# Patient Record
Sex: Female | Born: 1950
Health system: Southern US, Community
[De-identification: ages and names within clinical notes are randomized; demographics above are authoritative.]

## PROBLEM LIST (undated history)

## (undated) DIAGNOSIS — C8590 Non-Hodgkin lymphoma, unspecified, unspecified site: Secondary | ICD-10-CM

## (undated) DIAGNOSIS — D649 Anemia, unspecified: Secondary | ICD-10-CM

## (undated) DIAGNOSIS — Z8739 Personal history of other diseases of the musculoskeletal system and connective tissue: Secondary | ICD-10-CM

## (undated) DIAGNOSIS — Z85528 Personal history of other malignant neoplasm of kidney: Secondary | ICD-10-CM

## (undated) DIAGNOSIS — C4490 Unspecified malignant neoplasm of skin, unspecified: Secondary | ICD-10-CM

## (undated) DIAGNOSIS — C801 Malignant (primary) neoplasm, unspecified: Secondary | ICD-10-CM

## (undated) DIAGNOSIS — Z87442 Personal history of urinary calculi: Secondary | ICD-10-CM

## (undated) DIAGNOSIS — Z85038 Personal history of other malignant neoplasm of large intestine: Secondary | ICD-10-CM

## (undated) DIAGNOSIS — Z1509 Genetic susceptibility to other malignant neoplasm: Principal | ICD-10-CM

## (undated) DIAGNOSIS — T7840XA Allergy, unspecified, initial encounter: Secondary | ICD-10-CM

## (undated) DIAGNOSIS — E538 Deficiency of other specified B group vitamins: Secondary | ICD-10-CM

## (undated) DIAGNOSIS — M25511 Pain in right shoulder: Secondary | ICD-10-CM

## (undated) DIAGNOSIS — C189 Malignant neoplasm of colon, unspecified: Secondary | ICD-10-CM

## (undated) DIAGNOSIS — M1990 Unspecified osteoarthritis, unspecified site: Secondary | ICD-10-CM

## (undated) DIAGNOSIS — K529 Noninfective gastroenteritis and colitis, unspecified: Secondary | ICD-10-CM

## (undated) DIAGNOSIS — E785 Hyperlipidemia, unspecified: Secondary | ICD-10-CM

## (undated) DIAGNOSIS — Z87440 Personal history of urinary (tract) infections: Secondary | ICD-10-CM

## (undated) DIAGNOSIS — G709 Myoneural disorder, unspecified: Secondary | ICD-10-CM

## (undated) DIAGNOSIS — Z8614 Personal history of Methicillin resistant Staphylococcus aureus infection: Secondary | ICD-10-CM

## (undated) HISTORY — DX: Pain in right shoulder: M25.511

## (undated) HISTORY — DX: Deficiency of other specified B group vitamins: E53.8

## (undated) HISTORY — PX: EYE SURGERY: SHX253

## (undated) HISTORY — DX: Myoneural disorder, unspecified: G70.9

## (undated) HISTORY — DX: Hyperlipidemia, unspecified: E78.5

## (undated) HISTORY — DX: Malignant neoplasm of colon, unspecified: C18.9

## (undated) HISTORY — DX: Malignant (primary) neoplasm, unspecified: C80.1

## (undated) HISTORY — PX: COLON SURGERY: SHX602

## (undated) HISTORY — PX: COLONOSCOPY W/ POLYPECTOMY: SHX1380

## (undated) HISTORY — DX: Allergy, unspecified, initial encounter: T78.40XA

## (undated) HISTORY — DX: Anemia, unspecified: D64.9

## (undated) HISTORY — PX: OTHER SURGICAL HISTORY: SHX169

## (undated) HISTORY — DX: Personal history of Methicillin resistant Staphylococcus aureus infection: Z86.14

## (undated) HISTORY — DX: Genetic susceptibility to other malignant neoplasm: Z15.09

## (undated) HISTORY — PX: ABDOMINAL HYSTERECTOMY: SHX81

## (undated) HISTORY — DX: Non-Hodgkin lymphoma, unspecified, unspecified site: C85.90

## (undated) HISTORY — DX: Personal history of urinary (tract) infections: Z87.440

## (undated) HISTORY — PX: LITHOTRIPSY: SUR834

---

## 1991-03-19 HISTORY — PX: LAPAROSCOPIC ASSISTED VAGINAL HYSTERECTOMY: SHX5398

## 1991-03-19 HISTORY — PX: APPENDECTOMY: SHX54

## 1999-03-19 HISTORY — PX: HEMICOLECTOMY: SHX854

## 2000-03-18 HISTORY — PX: HEMICOLECTOMY: SHX854

## 2000-07-31 ENCOUNTER — Encounter: Payer: Self-pay | Admitting: Specialist

## 2000-07-31 ENCOUNTER — Ambulatory Visit (HOSPITAL_COMMUNITY): Admission: RE | Admit: 2000-07-31 | Discharge: 2000-07-31 | Payer: Self-pay | Admitting: Specialist

## 2000-08-08 ENCOUNTER — Ambulatory Visit (HOSPITAL_COMMUNITY): Admission: RE | Admit: 2000-08-08 | Discharge: 2000-08-08 | Payer: Self-pay | Admitting: Family Medicine

## 2000-08-08 ENCOUNTER — Encounter: Payer: Self-pay | Admitting: Family Medicine

## 2000-08-19 ENCOUNTER — Ambulatory Visit (HOSPITAL_COMMUNITY): Admission: RE | Admit: 2000-08-19 | Discharge: 2000-08-19 | Payer: Self-pay | Admitting: General Surgery

## 2000-08-19 ENCOUNTER — Encounter: Payer: Self-pay | Admitting: General Surgery

## 2000-08-22 ENCOUNTER — Inpatient Hospital Stay (HOSPITAL_COMMUNITY): Admission: RE | Admit: 2000-08-22 | Discharge: 2000-08-27 | Payer: Self-pay | Admitting: Internal Medicine

## 2000-10-01 ENCOUNTER — Encounter: Admission: RE | Admit: 2000-10-01 | Discharge: 2000-10-01 | Payer: Self-pay | Admitting: Oncology

## 2001-03-13 ENCOUNTER — Ambulatory Visit (HOSPITAL_COMMUNITY): Admission: RE | Admit: 2001-03-13 | Discharge: 2001-03-13 | Payer: Self-pay | Admitting: Internal Medicine

## 2001-07-31 ENCOUNTER — Ambulatory Visit (HOSPITAL_COMMUNITY): Admission: RE | Admit: 2001-07-31 | Discharge: 2001-07-31 | Payer: Self-pay | Admitting: *Deleted

## 2001-07-31 ENCOUNTER — Encounter: Payer: Self-pay | Admitting: Specialist

## 2001-09-17 ENCOUNTER — Ambulatory Visit (HOSPITAL_COMMUNITY): Admission: RE | Admit: 2001-09-17 | Discharge: 2001-09-17 | Payer: Self-pay | Admitting: Internal Medicine

## 2002-01-08 ENCOUNTER — Ambulatory Visit (HOSPITAL_COMMUNITY): Admission: RE | Admit: 2002-01-08 | Discharge: 2002-01-08 | Payer: Self-pay | Admitting: Internal Medicine

## 2002-01-08 ENCOUNTER — Encounter (INDEPENDENT_AMBULATORY_CARE_PROVIDER_SITE_OTHER): Payer: Self-pay | Admitting: Internal Medicine

## 2002-05-30 ENCOUNTER — Emergency Department (HOSPITAL_COMMUNITY): Admission: EM | Admit: 2002-05-30 | Discharge: 2002-05-30 | Payer: Self-pay | Admitting: Emergency Medicine

## 2002-06-01 ENCOUNTER — Ambulatory Visit (HOSPITAL_COMMUNITY): Admission: RE | Admit: 2002-06-01 | Discharge: 2002-06-01 | Payer: Self-pay | Admitting: Internal Medicine

## 2002-08-04 ENCOUNTER — Encounter: Payer: Self-pay | Admitting: Family Medicine

## 2002-08-04 ENCOUNTER — Ambulatory Visit (HOSPITAL_COMMUNITY): Admission: RE | Admit: 2002-08-04 | Discharge: 2002-08-04 | Payer: Self-pay | Admitting: Family Medicine

## 2003-08-09 ENCOUNTER — Ambulatory Visit (HOSPITAL_COMMUNITY): Admission: RE | Admit: 2003-08-09 | Discharge: 2003-08-09 | Payer: Self-pay | Admitting: Specialist

## 2003-09-27 ENCOUNTER — Ambulatory Visit (HOSPITAL_COMMUNITY): Admission: RE | Admit: 2003-09-27 | Discharge: 2003-09-27 | Payer: Self-pay | Admitting: Internal Medicine

## 2003-12-14 ENCOUNTER — Ambulatory Visit (HOSPITAL_COMMUNITY): Admission: RE | Admit: 2003-12-14 | Discharge: 2003-12-14 | Payer: Self-pay | Admitting: Family Medicine

## 2004-09-26 ENCOUNTER — Ambulatory Visit: Payer: Self-pay | Admitting: Internal Medicine

## 2004-10-04 ENCOUNTER — Ambulatory Visit: Payer: Self-pay | Admitting: Internal Medicine

## 2004-11-16 ENCOUNTER — Ambulatory Visit: Payer: Self-pay | Admitting: Internal Medicine

## 2004-11-16 ENCOUNTER — Ambulatory Visit (HOSPITAL_COMMUNITY): Admission: RE | Admit: 2004-11-16 | Discharge: 2004-11-16 | Payer: Self-pay | Admitting: Internal Medicine

## 2004-12-04 ENCOUNTER — Encounter: Admission: RE | Admit: 2004-12-04 | Discharge: 2004-12-04 | Payer: Self-pay | Admitting: Internal Medicine

## 2004-12-18 ENCOUNTER — Ambulatory Visit (HOSPITAL_COMMUNITY): Admission: RE | Admit: 2004-12-18 | Discharge: 2004-12-18 | Payer: Self-pay | Admitting: Internal Medicine

## 2005-03-18 HISTORY — PX: SUBTOTAL COLECTOMY: SHX855

## 2005-03-18 HISTORY — PX: OTHER SURGICAL HISTORY: SHX169

## 2005-03-20 ENCOUNTER — Ambulatory Visit (HOSPITAL_COMMUNITY): Admission: RE | Admit: 2005-03-20 | Discharge: 2005-03-20 | Payer: Self-pay | Admitting: Internal Medicine

## 2005-04-17 ENCOUNTER — Encounter (INDEPENDENT_AMBULATORY_CARE_PROVIDER_SITE_OTHER): Payer: Self-pay | Admitting: Specialist

## 2005-04-17 ENCOUNTER — Inpatient Hospital Stay (HOSPITAL_COMMUNITY): Admission: RE | Admit: 2005-04-17 | Discharge: 2005-04-20 | Payer: Self-pay | Admitting: Urology

## 2005-08-30 ENCOUNTER — Ambulatory Visit (HOSPITAL_COMMUNITY): Admission: RE | Admit: 2005-08-30 | Discharge: 2005-08-30 | Payer: Self-pay | Admitting: Internal Medicine

## 2005-08-30 ENCOUNTER — Encounter (INDEPENDENT_AMBULATORY_CARE_PROVIDER_SITE_OTHER): Payer: Self-pay | Admitting: Specialist

## 2005-08-30 ENCOUNTER — Ambulatory Visit: Payer: Self-pay | Admitting: Internal Medicine

## 2005-09-20 ENCOUNTER — Ambulatory Visit (HOSPITAL_COMMUNITY): Admission: RE | Admit: 2005-09-20 | Discharge: 2005-09-20 | Payer: Self-pay | Admitting: Family Medicine

## 2005-10-02 ENCOUNTER — Ambulatory Visit: Payer: Self-pay | Admitting: Internal Medicine

## 2006-01-20 ENCOUNTER — Ambulatory Visit (HOSPITAL_COMMUNITY): Payer: Self-pay | Admitting: Oncology

## 2006-01-20 ENCOUNTER — Encounter: Admission: RE | Admit: 2006-01-20 | Discharge: 2006-01-20 | Payer: Self-pay | Admitting: Oncology

## 2006-03-03 ENCOUNTER — Emergency Department (HOSPITAL_COMMUNITY): Admission: EM | Admit: 2006-03-03 | Discharge: 2006-03-03 | Payer: Self-pay | Admitting: Emergency Medicine

## 2006-03-14 ENCOUNTER — Encounter (HOSPITAL_COMMUNITY): Admission: RE | Admit: 2006-03-14 | Discharge: 2006-03-17 | Payer: Self-pay | Admitting: Oncology

## 2006-03-17 ENCOUNTER — Ambulatory Visit (HOSPITAL_COMMUNITY): Admission: RE | Admit: 2006-03-17 | Discharge: 2006-03-17 | Payer: Self-pay | Admitting: Oncology

## 2006-03-24 ENCOUNTER — Ambulatory Visit (HOSPITAL_COMMUNITY): Payer: Self-pay | Admitting: Oncology

## 2006-03-27 ENCOUNTER — Ambulatory Visit: Payer: Self-pay | Admitting: Orthopedic Surgery

## 2006-06-20 ENCOUNTER — Ambulatory Visit (HOSPITAL_COMMUNITY): Payer: Self-pay | Admitting: Oncology

## 2006-06-20 ENCOUNTER — Encounter (HOSPITAL_COMMUNITY): Admission: RE | Admit: 2006-06-20 | Discharge: 2006-07-20 | Payer: Self-pay | Admitting: Oncology

## 2006-09-26 ENCOUNTER — Encounter (HOSPITAL_COMMUNITY): Admission: RE | Admit: 2006-09-26 | Discharge: 2006-10-26 | Payer: Self-pay | Admitting: Oncology

## 2006-09-26 ENCOUNTER — Ambulatory Visit (HOSPITAL_COMMUNITY): Payer: Self-pay | Admitting: Oncology

## 2006-10-03 ENCOUNTER — Ambulatory Visit (HOSPITAL_COMMUNITY): Admission: RE | Admit: 2006-10-03 | Discharge: 2006-10-03 | Payer: Self-pay | Admitting: Obstetrics and Gynecology

## 2006-12-26 ENCOUNTER — Encounter (HOSPITAL_COMMUNITY): Admission: RE | Admit: 2006-12-26 | Discharge: 2007-01-25 | Payer: Self-pay | Admitting: Oncology

## 2006-12-26 ENCOUNTER — Ambulatory Visit (HOSPITAL_COMMUNITY): Payer: Self-pay | Admitting: Oncology

## 2007-03-10 ENCOUNTER — Ambulatory Visit (HOSPITAL_COMMUNITY): Admission: RE | Admit: 2007-03-10 | Discharge: 2007-03-10 | Payer: Self-pay | Admitting: Internal Medicine

## 2007-03-23 ENCOUNTER — Encounter (HOSPITAL_COMMUNITY): Admission: RE | Admit: 2007-03-23 | Discharge: 2007-04-22 | Payer: Self-pay | Admitting: Oncology

## 2007-03-23 ENCOUNTER — Ambulatory Visit (HOSPITAL_COMMUNITY): Payer: Self-pay | Admitting: Oncology

## 2007-05-04 ENCOUNTER — Encounter (HOSPITAL_COMMUNITY): Admission: RE | Admit: 2007-05-04 | Discharge: 2007-06-03 | Payer: Self-pay | Admitting: Oncology

## 2007-06-22 ENCOUNTER — Encounter (HOSPITAL_COMMUNITY): Admission: RE | Admit: 2007-06-22 | Discharge: 2007-07-22 | Payer: Self-pay | Admitting: Oncology

## 2007-06-22 ENCOUNTER — Ambulatory Visit (HOSPITAL_COMMUNITY): Payer: Self-pay | Admitting: Oncology

## 2007-07-24 ENCOUNTER — Encounter (HOSPITAL_COMMUNITY): Admission: RE | Admit: 2007-07-24 | Discharge: 2007-08-23 | Payer: Self-pay | Admitting: Oncology

## 2007-08-14 ENCOUNTER — Ambulatory Visit (HOSPITAL_COMMUNITY): Payer: Self-pay | Admitting: Oncology

## 2007-09-16 ENCOUNTER — Ambulatory Visit (HOSPITAL_COMMUNITY): Admission: RE | Admit: 2007-09-16 | Discharge: 2007-09-16 | Payer: Self-pay | Admitting: Internal Medicine

## 2007-09-16 ENCOUNTER — Encounter (INDEPENDENT_AMBULATORY_CARE_PROVIDER_SITE_OTHER): Payer: Self-pay | Admitting: Internal Medicine

## 2007-10-19 ENCOUNTER — Ambulatory Visit (HOSPITAL_COMMUNITY): Payer: Self-pay | Admitting: Oncology

## 2007-10-23 ENCOUNTER — Ambulatory Visit (HOSPITAL_COMMUNITY): Admission: RE | Admit: 2007-10-23 | Discharge: 2007-10-23 | Payer: Self-pay | Admitting: Obstetrics and Gynecology

## 2007-12-15 ENCOUNTER — Ambulatory Visit (HOSPITAL_COMMUNITY): Payer: Self-pay | Admitting: Oncology

## 2008-01-01 ENCOUNTER — Encounter (HOSPITAL_COMMUNITY): Admission: RE | Admit: 2008-01-01 | Discharge: 2008-01-31 | Payer: Self-pay | Admitting: Oncology

## 2008-02-10 ENCOUNTER — Ambulatory Visit (HOSPITAL_COMMUNITY): Payer: Self-pay | Admitting: Oncology

## 2008-04-07 ENCOUNTER — Encounter (INDEPENDENT_AMBULATORY_CARE_PROVIDER_SITE_OTHER): Payer: Self-pay | Admitting: Internal Medicine

## 2008-04-07 ENCOUNTER — Ambulatory Visit (HOSPITAL_COMMUNITY): Admission: RE | Admit: 2008-04-07 | Discharge: 2008-04-07 | Payer: Self-pay | Admitting: Internal Medicine

## 2008-04-12 ENCOUNTER — Ambulatory Visit (HOSPITAL_COMMUNITY): Payer: Self-pay | Admitting: Oncology

## 2008-06-10 ENCOUNTER — Ambulatory Visit (HOSPITAL_COMMUNITY): Payer: Self-pay | Admitting: Oncology

## 2008-08-04 ENCOUNTER — Ambulatory Visit (HOSPITAL_COMMUNITY): Payer: Self-pay | Admitting: Oncology

## 2008-09-29 ENCOUNTER — Ambulatory Visit (HOSPITAL_COMMUNITY): Payer: Self-pay | Admitting: Oncology

## 2008-10-12 ENCOUNTER — Encounter (HOSPITAL_COMMUNITY): Admission: RE | Admit: 2008-10-12 | Discharge: 2008-11-11 | Payer: Self-pay | Admitting: Oncology

## 2008-12-01 ENCOUNTER — Ambulatory Visit (HOSPITAL_COMMUNITY): Payer: Self-pay | Admitting: Oncology

## 2009-01-23 ENCOUNTER — Ambulatory Visit (HOSPITAL_COMMUNITY): Admission: RE | Admit: 2009-01-23 | Discharge: 2009-01-23 | Payer: Self-pay | Admitting: Obstetrics & Gynecology

## 2009-01-25 ENCOUNTER — Ambulatory Visit (HOSPITAL_COMMUNITY): Payer: Self-pay | Admitting: Oncology

## 2009-03-22 ENCOUNTER — Ambulatory Visit (HOSPITAL_COMMUNITY): Payer: Self-pay | Admitting: Oncology

## 2009-06-01 ENCOUNTER — Ambulatory Visit (HOSPITAL_COMMUNITY): Payer: Self-pay | Admitting: Oncology

## 2009-06-28 ENCOUNTER — Ambulatory Visit (HOSPITAL_COMMUNITY): Admission: RE | Admit: 2009-06-28 | Discharge: 2009-06-28 | Payer: Self-pay | Admitting: Internal Medicine

## 2009-07-27 ENCOUNTER — Encounter (HOSPITAL_COMMUNITY): Admission: RE | Admit: 2009-07-27 | Discharge: 2009-08-26 | Payer: Self-pay | Admitting: Oncology

## 2009-07-27 ENCOUNTER — Ambulatory Visit (HOSPITAL_COMMUNITY): Payer: Self-pay | Admitting: Oncology

## 2009-09-25 ENCOUNTER — Ambulatory Visit (HOSPITAL_COMMUNITY): Payer: Self-pay | Admitting: Oncology

## 2009-10-17 ENCOUNTER — Ambulatory Visit (HOSPITAL_COMMUNITY): Admission: RE | Admit: 2009-10-17 | Discharge: 2009-10-17 | Payer: Self-pay | Admitting: Family Medicine

## 2009-11-15 ENCOUNTER — Ambulatory Visit (HOSPITAL_COMMUNITY): Admission: RE | Admit: 2009-11-15 | Discharge: 2009-11-15 | Payer: Self-pay | Admitting: Family Medicine

## 2009-11-17 ENCOUNTER — Ambulatory Visit (HOSPITAL_COMMUNITY): Payer: Self-pay | Admitting: Oncology

## 2010-01-23 ENCOUNTER — Encounter (HOSPITAL_COMMUNITY)
Admission: RE | Admit: 2010-01-23 | Discharge: 2010-02-22 | Payer: Self-pay | Source: Home / Self Care | Attending: Oncology | Admitting: Oncology

## 2010-01-23 ENCOUNTER — Ambulatory Visit (HOSPITAL_COMMUNITY): Payer: Self-pay | Admitting: Oncology

## 2010-01-25 ENCOUNTER — Ambulatory Visit (HOSPITAL_COMMUNITY): Admission: RE | Admit: 2010-01-25 | Discharge: 2010-01-25 | Payer: Self-pay | Admitting: Family Medicine

## 2010-03-23 ENCOUNTER — Encounter (HOSPITAL_COMMUNITY)
Admission: RE | Admit: 2010-03-23 | Discharge: 2010-04-17 | Payer: Self-pay | Source: Home / Self Care | Attending: Oncology | Admitting: Oncology

## 2010-03-23 ENCOUNTER — Ambulatory Visit (HOSPITAL_COMMUNITY)
Admission: RE | Admit: 2010-03-23 | Discharge: 2010-04-17 | Payer: Self-pay | Source: Home / Self Care | Attending: Oncology | Admitting: Oncology

## 2010-04-27 ENCOUNTER — Ambulatory Visit (HOSPITAL_COMMUNITY): Payer: 59

## 2010-04-27 DIAGNOSIS — E538 Deficiency of other specified B group vitamins: Secondary | ICD-10-CM

## 2010-04-27 DIAGNOSIS — C189 Malignant neoplasm of colon, unspecified: Secondary | ICD-10-CM

## 2010-05-25 ENCOUNTER — Ambulatory Visit (HOSPITAL_COMMUNITY): Payer: 59

## 2010-05-25 DIAGNOSIS — E538 Deficiency of other specified B group vitamins: Secondary | ICD-10-CM

## 2010-06-05 LAB — CBC
HCT: 36.3 % (ref 36.0–46.0)
Hemoglobin: 12.8 g/dL (ref 12.0–15.0)
MCV: 88.9 fL (ref 78.0–100.0)
Platelets: 187 10*3/uL (ref 150–400)
RBC: 4.09 MIL/uL (ref 3.87–5.11)
WBC: 7.6 10*3/uL (ref 4.0–10.5)

## 2010-06-05 LAB — COMPREHENSIVE METABOLIC PANEL
ALT: 19 U/L (ref 0–35)
CO2: 26 mEq/L (ref 19–32)
Chloride: 105 mEq/L (ref 96–112)
Creatinine, Ser: 0.68 mg/dL (ref 0.4–1.2)
GFR calc Af Amer: >60 mL/min (ref >60)
GFR calc non Af Amer: >60 mL/min (ref >60)
Total Protein: 6.7 g/dL (ref 6.0–8.3)

## 2010-06-20 ENCOUNTER — Encounter (HOSPITAL_COMMUNITY): Payer: 59 | Attending: Oncology

## 2010-06-20 DIAGNOSIS — C189 Malignant neoplasm of colon, unspecified: Secondary | ICD-10-CM

## 2010-06-20 DIAGNOSIS — E538 Deficiency of other specified B group vitamins: Secondary | ICD-10-CM

## 2010-06-24 LAB — COMPREHENSIVE METABOLIC PANEL
Alkaline Phosphatase: 63 U/L (ref 39–117)
CO2: 29 mEq/L (ref 19–32)
Calcium: 9 mg/dL (ref 8.4–10.5)
Glucose, Bld: 86 mg/dL (ref 70–99)
Total Protein: 6.7 g/dL (ref 6.0–8.3)

## 2010-06-24 LAB — CBC
Platelets: 179 10*3/uL (ref 150–400)
RDW: 13 % (ref 11.5–15.5)
WBC: 7.3 10*3/uL (ref 4.0–10.5)

## 2010-06-24 LAB — DIFFERENTIAL
Basophils Relative: 1 % (ref 0–1)
Eosinophils Relative: 3 % (ref 0–5)
Lymphocytes Relative: 24 % (ref 12–46)
Lymphs Abs: 1.7 10*3/uL (ref 0.7–4.0)
Neutro Abs: 4.7 10*3/uL (ref 1.7–7.7)
Neutrophils Relative %: 64 % (ref 43–77)

## 2010-07-18 ENCOUNTER — Encounter (HOSPITAL_COMMUNITY): Payer: 59 | Attending: Oncology

## 2010-07-18 DIAGNOSIS — E538 Deficiency of other specified B group vitamins: Secondary | ICD-10-CM

## 2010-07-26 ENCOUNTER — Encounter (HOSPITAL_BASED_OUTPATIENT_CLINIC_OR_DEPARTMENT_OTHER): Payer: 59 | Admitting: Internal Medicine

## 2010-07-26 ENCOUNTER — Ambulatory Visit (HOSPITAL_COMMUNITY)
Admission: RE | Admit: 2010-07-26 | Discharge: 2010-07-26 | Disposition: A | Discharge status: Home / Self Care | Payer: 59 | Source: Ambulatory Visit | Attending: Internal Medicine | Admitting: Internal Medicine

## 2010-07-26 DIAGNOSIS — Z1509 Genetic susceptibility to other malignant neoplasm: Secondary | ICD-10-CM

## 2010-07-26 DIAGNOSIS — Z09 Encounter for follow-up examination after completed treatment for conditions other than malignant neoplasm: Secondary | ICD-10-CM | POA: Insufficient documentation

## 2010-07-26 DIAGNOSIS — Z9049 Acquired absence of other specified parts of digestive tract: Secondary | ICD-10-CM | POA: Insufficient documentation

## 2010-07-26 DIAGNOSIS — Z85038 Personal history of other malignant neoplasm of large intestine: Secondary | ICD-10-CM

## 2010-07-31 NOTE — Op Note (Signed)
NAMEJOSEFA, Nicole [name]            ACCOUNT NO.:  [id number]   MEDICAL RECORD NO.:  [id number]          PATIENT TYPE:  AMB   LOCATION:  DAY                           FACILITY:  [facility]   PHYSICIAN:  Lionel December, M.D.    DATE OF BIRTH:  [birth year]   DATE OF PROCEDURE:  09/16/2007  DATE OF DISCHARGE:                                PROCEDURE NOTE   PROCEDURE:  Flexible sigmoidoscopy.   INDICATION:  Latajah is a 60 year old Caucasian female who is undergoing  surveillance sigmoidoscopy.  Alyxandra has Lynch syndrome or HNPCC.  Her  stage A cecal carcinoma was diagnosed in July 2001, and she had right  hemicolectomy.  She had resection of splenic flexure for another small  stage A tumor in July 2002.  In 2007, she was found another small lesion  in her sigmoid colon.  She had genetic testing and was confirmed to have  HNPCC.  She declined to have proctocolectomy.  She had low anterior  resection by Dr. [name] of [facility].  She had a flexible  sigmoidoscopy 6 months ago with removal of 2 small polyps, they were  nonadenomatous.  She remains asymptomatic.  Procedure risks were  reviewed with the patient and informed consent was obtained.   Meds for conscious sedation, none use.   FINDINGS:  Procedure performed in an endoscopy suite.  The patient was  placed in left lateral recumbent position.  Rectal examination was  performed.  No abnormality noted on external or digital exam.  Pentax  video scope was placed through rectum and advanced into rectosigmoid  junction.  Anastomosis carried at 18-19 centimeters on the anal margin  and was wide opened.  Few centimeters of small bowel mucosa was examined  and was normal.  As the scope was withdrawn rectal mucosa was carefully  examined.  There was a 2-3 mm polyp, which was coagulated using snare  tip.  There was another 5-mm polyp close to dentate line, which was  biopsied for histology and rest of it was coagulated using a snare tip.  No other  abnormalities were noted.  Endoscope was withdrawn.  The  patient tolerated the procedure well.   FINAL DIAGNOSES:  1. Patent ileorectal anastomosis.  2. Tiny polyp at proximal rectum was coagulated using snare tip.      Another small polyp was biopsied for histology and residual polyp      was coagulated using snare tip.   RECOMMENDATIONS:  Standard instructions given.  I will be contacting the  patient with the results of biopsy and further recommendations.      Lionel December, M.D.  Electronically Signed     NR/MEDQ  D:  09/16/2007  T:  09/17/2007  Job:  [id number]   cc:   Mila Homer. Sudie Bailey, M.D.  Fax: [phone]

## 2010-07-31 NOTE — Op Note (Signed)
NAMESYDELL, PROWELL            ACCOUNT NO.:  [id number]   MEDICAL RECORD NO.:  [id number]          PATIENT TYPE:  AMB   LOCATION:  DAY                           FACILITY:  [facility]   PHYSICIAN:  Lionel December, M.D.    DATE OF BIRTH:  [birth year]   DATE OF PROCEDURE:  03/10/2007  DATE OF DISCHARGE:                               OPERATIVE REPORT   PROBLEM:  Flexible sigmoidoscopy.   INDICATIONS FOR PROCEDURE:  The patient is a 60 year old Caucasian  female with a history of Lynch syndrome and colon carcinoma who is left  with sigmoid rim and rectum.  Her last surgery was at [facility] when she had  subtotal colectomy.  She declined a proctocolectomy.  She is  asymptomatic.  Procedure risks were reviewed with the patient and  informed consent was obtained.   SEDATION:  Meds for conscious sedation; none.   FINDINGS:  The procedure performed in endoscopy suite.  The patient was  placed in the left lateral position and rectal examination was  performed.  No abnormality noted on external or digital examination.  Pentax video scope was placed in the rectum and advanced under vision  into the distal sigmoid colon.  Anastomosis was located at 20 cm from  the anal margin.  Distal small bowel was examined for about 10-15 cm and  was normal.  There were three tiny polyps just at the anastomosis which  were ablated via cold biopsy and submitted in one container.  Rectal  mucosa was normal.  Scope was retroflexed to examine anorectal junction  which was unremarkable.  Endoscope was straightened and withdrawn.  The  patient tolerated the procedure well.   DIAGNOSIS:  Patent ileocolonic anastomosis.  She only has short segment  of sigmoid and rectum left.  Three tiny polyps distal to anastomosis  possibly hyperplastic.  These were ablated via cold biopsy.   RECOMMENDATIONS:  She will resume her usual medicines and diet.  I will  be contacting the patient with results of biopsy and she will be  returning for the next repeat sigmoidoscopy in six months since she has  Lynch syndrome.      Lionel December, M.D.  Electronically Signed     NR/MEDQ  D:  03/10/2007  T:  03/10/2007  Job:  [id number]   cc:   Ladona Horns. Mariel Sleet, MD  Fax: [phone]   Mila Homer. Sudie Bailey, M.D.  Fax: [phone]

## 2010-07-31 NOTE — Op Note (Signed)
NAME:  Nicole [name], Nicole [name]               ACCOUNT NO.:  [id number]   MEDICAL RECORD NO.:  [id number]          PATIENT TYPE:  AMB   LOCATION:  DAY                           FACILITY:  [facility]   PHYSICIAN:  Lionel December, M.D.    DATE OF BIRTH:  1950/04/12   DATE OF PROCEDURE:  04/07/2008  DATE OF DISCHARGE:                               OPERATIVE REPORT   PROCEDURE:  Flexible sigmoidoscopy.   INDICATION:  [name] is a 60 year old Caucasian female with a history of  Lynch  syndrome with 3 colonic surgeries. She had her last surgery by  Dr. [name] of [facility] in September 2007 and declined to have  proctocolectomy. she still has her rectum and is therefore undergoing  sigmoidoscopy.   FINDINGS:  Procedure performed in endoscopy suite.  The patient was  placed in left lateral recumbent position, rectal examination performed.  No abnormality noted on external digital exam.  Pentax videoscope was  placed in the rectum and advanced to the rectosigmoid area.  She had  anastomosis located at 15 cm from the anal margin.  Small bowel was  examined for about 15 to 20 cm and mucosa was normal.  As the scope was  withdrawn mucosa of recto-sigmoid junction and rectum was very carefully  examined using white light as well as magnification.  There was 4 mm  polyp at distal rectum just above the dentate line.  Given her history  I elected to snare this polyp.  There was some oozing from polypectomy  site.  This persisted despite attempting to coagulate with a snare tip.  Finally, hemoclip was applied and oozing was controlled.  Picture was  taken for the record.  While in the rectum, scope was retroflexed and  examined the anorectal junction and there was no other abnormality that  were noted.  Endoscope was withdrawn.  The patient tolerated the  procedure well.   FINAL DIAGNOSES:  1. Wide open ileoproctostomy.  2. Small polyp snared from distal rectum.  Oozing noted from      polypectomy site and  controlled with application of a hemoclip.   RECOMMENDATIONS:  No aspirin for 10 days.   I will be contacting the patient with the results of biopsy.   She will be returning for a sigmoidoscopy possibly in 6 months.      Lionel December, M.D.  Electronically Signed     NR/MEDQ  D:  04/07/2008  T:  04/08/2008  Job:  [id number]   cc:   Mila Homer. Sudie Bailey, M.D.  Fax: [phone]   Ladona Horns. Mariel Sleet, MD  Fax: [phone]

## 2010-08-03 NOTE — Discharge Summary (Signed)
NAME:  Nicole [name], Nicole [name]               ACCOUNT NO.:  [id number]   MEDICAL RECORD NO.:  [id number]          PATIENT TYPE:  INP   LOCATION:  1432                         FACILITY:  [facility]   PHYSICIAN:  [name], M.D.DATE OF BIRTH:  1951/01/03   DATE OF ADMISSION:  04/17/2005  DATE OF DISCHARGE:  04/20/2005                                 DISCHARGE SUMMARY   DISCHARGE DIAGNOSIS:  Left renal mass with hilar lymph node.   PROCEDURE:  1.  Left flank exploration with partial nephrectomy.  2.  Left hilar lymph node biopsy.  3.  Intraoperative ultrasound with interpretation.   SURGEON:  Marcine Matar, M.D.   CONSULTANTS:  None.   HOSPITAL COURSE:  The patient was admitted to the operating room on April 17, 2005 and underwent the above named procedures, which she tolerated  without complication.  For a complete description of this procedure, please  consult the operative note.  Postoperatively, she was transferred to the  floor and then the PACU in stable condition where she remained throughout  her hospital stay, which was uncomplicated and consistently progressed with  toleration of ambulation, diet and pain.  Postoperative day #1, her Foley  catheter was removed and she successfully passed the voiding trial.  Her  flank was managed with a Al Pimple drain.  The output was slowly  decreased over entire hospitalization and did not increase with removal of  Foley catheter.  On day of discharge, was noted to be putting out less than  10 mL per shift and it was subsequently discharged on April 20, 2005.  It  was determined the patient was in stable condition to be discharged home.   PHYSICAL EXAMINATION:  VITAL SIGNS:  At time of discharge:  Temperature  97.5, pulse 96, respirations 20, blood pressure 143/88.  ABDOMEN:  Positive bowel sounds, soft, nontender to palpation without  costovertebral angle tenderness.  The incision is clean, dry and intact.  Staples are in  place.  Al Pimple drain is without erythema or exudate  and incision site is without erythema or exudate.  For remainder of physical  exam, please consult admission H&P as it is unchanged.   DISPOSITION:  To home.   DISCHARGE INSTRUCTIONS:  The patient is given routine wound care  instructions and instructed to call or return if she has any fever, chills,  nausea, vomiting, redness or drainage from her wound.  She is instructed not  to drive for the next two weeks.  She is instructed not to lift more than 10  pounds for the next six weeks.  She is instructed she may shower effective  immediately, but to avoid soaking her wound until postoperative day #10.  She understands and agrees with these instructions.   DISCHARGE MEDICATIONS:  1.  Vicodin.  2.  Colace.     ______________________________  Glade Nurse, MD      [name], M.D.  Electronically Signed    MT/MEDQ  D:  04/22/2005  T:  04/23/2005  Job:  [id number]

## 2010-08-13 NOTE — Op Note (Signed)
  NAME:  [name], [name]               ACCOUNT NO.:  [id number]  MEDICAL RECORD NO.:  [id number]           PATIENT TYPE:  O  LOCATION:  DAYP                          FACILITY:  [facility]  PHYSICIAN:  Lionel December, M.D.    DATE OF BIRTH:  1950-08-26  DATE OF PROCEDURE:  07/26/2010 DATE OF DISCHARGE:                              OPERATIVE REPORT   PROCEDURE:  Flexible sigmoidoscopy.  INDICATION:  Nicole [name] is 60 year old Caucasian female with history of Lynch syndrome with a subtotal colectomy for colorectal carcinoma.  She is doing very well.  She is here for a yearly flexible sigmoidoscopy.  Her last exam was in April 2011.  Procedures were reviewed with the patient.  Informed consent was obtained.  MEDS FOR CONSCIOUS SEDATION:  None.  FINDINGS:  Procedure performed in endoscopy suite.  The patient's vital signs and O2 sat were monitored during procedure and remained stable. The patient was placed in left lateral recumbent position.  Rectalexamination performed.  No abnormality noted on external or digital exam.  Pentax videoscope was placed through rectum and advanced under vision into rectosigmoid junction where ileocolonic anastomosis was identified.  It was wide open.  Scope was passed into terminal ileum which was examined for 15-20 cm and was normal.  As the scope was withdrawn, rectal mucosa was once again carefully examined and no mucosal abnormalities were noted.  Scope was retroflexed to examine anorectal junction and this again was normal.  Endoscope was withdrawn. The patient tolerated procedure well.  FINAL DIAGNOSES:  Normal flexible sigmoidoscopy.  RECOMMENDATIONS:  Standard instructions given.  Consider next flexible sigmoidoscopy in 1 year.          ______________________________ Lionel December, M.D.     NR/MEDQ  D:  07/26/2010  T:  07/26/2010  Job:  [id number]  cc:   Mila Homer. Sudie Bailey, M.D. Fax: [phone]  Electronically Signed by Lionel December M.D. on  08/13/2010 06:37:18 PM

## 2010-08-20 ENCOUNTER — Encounter (HOSPITAL_COMMUNITY): Payer: 59 | Attending: Oncology

## 2010-08-20 DIAGNOSIS — E538 Deficiency of other specified B group vitamins: Secondary | ICD-10-CM

## 2010-08-20 DIAGNOSIS — C189 Malignant neoplasm of colon, unspecified: Secondary | ICD-10-CM

## 2010-09-18 ENCOUNTER — Encounter (HOSPITAL_COMMUNITY): Payer: 59 | Attending: Oncology

## 2010-09-18 DIAGNOSIS — E538 Deficiency of other specified B group vitamins: Secondary | ICD-10-CM

## 2010-10-17 ENCOUNTER — Other Ambulatory Visit (HOSPITAL_COMMUNITY): Payer: Self-pay | Admitting: Oncology

## 2010-10-17 ENCOUNTER — Encounter (HOSPITAL_COMMUNITY): Payer: Self-pay | Admitting: Oncology

## 2010-10-17 ENCOUNTER — Encounter (HOSPITAL_COMMUNITY): Payer: 59 | Attending: Oncology

## 2010-10-17 ENCOUNTER — Encounter (HOSPITAL_BASED_OUTPATIENT_CLINIC_OR_DEPARTMENT_OTHER): Payer: 59

## 2010-10-17 VITALS — BP 148/79 | HR 67

## 2010-10-17 DIAGNOSIS — E669 Obesity, unspecified: Secondary | ICD-10-CM

## 2010-10-17 DIAGNOSIS — Z1509 Genetic susceptibility to other malignant neoplasm: Secondary | ICD-10-CM

## 2010-10-17 DIAGNOSIS — E538 Deficiency of other specified B group vitamins: Secondary | ICD-10-CM

## 2010-10-17 DIAGNOSIS — C649 Malignant neoplasm of unspecified kidney, except renal pelvis: Secondary | ICD-10-CM | POA: Insufficient documentation

## 2010-10-17 DIAGNOSIS — Z800 Family history of malignant neoplasm of digestive organs: Secondary | ICD-10-CM | POA: Insufficient documentation

## 2010-10-17 DIAGNOSIS — C8589 Other specified types of non-Hodgkin lymphoma, extranodal and solid organ sites: Secondary | ICD-10-CM | POA: Insufficient documentation

## 2010-10-17 DIAGNOSIS — C8590 Non-Hodgkin lymphoma, unspecified, unspecified site: Secondary | ICD-10-CM

## 2010-10-17 HISTORY — DX: Non-Hodgkin lymphoma, unspecified, unspecified site: C85.90

## 2010-10-17 HISTORY — DX: Genetic susceptibility to other malignant neoplasm: Z15.09

## 2010-10-17 LAB — LACTATE DEHYDROGENASE: LDH: 149 U/L (ref 94–250)

## 2010-10-17 LAB — COMPREHENSIVE METABOLIC PANEL
ALT: 17 U/L (ref 0–35)
Alkaline Phosphatase: 74 U/L (ref 39–117)
CO2: 21 mEq/L (ref 19–32)
GFR calc Af Amer: >60 mL/min (ref >60)
GFR calc non Af Amer: >60 mL/min (ref >60)
Glucose, Bld: 87 mg/dL (ref 70–99)
Potassium: 3.9 mEq/L (ref 3.5–5.1)
Sodium: 137 mEq/L (ref 135–145)
Total Bilirubin: 0.3 mg/dL (ref 0.3–1.2)

## 2010-10-17 LAB — CBC
HCT: 38 % (ref 36.0–46.0)
Hemoglobin: 12.7 g/dL (ref 12.0–15.0)
MCHC: 33.4 g/dL (ref 30.0–36.0)
MCV: 90.9 fL (ref 78.0–100.0)

## 2010-10-17 LAB — DIFFERENTIAL
Basophils Absolute: 0.1 10*3/uL (ref 0.0–0.1)
Basophils Relative: 1 % (ref 0–1)
Eosinophils Absolute: 0.2 10*3/uL (ref 0.0–0.7)
Eosinophils Relative: 2 % (ref 0–5)

## 2010-10-17 MED ORDER — CYANOCOBALAMIN 1000 MCG/ML IJ SOLN
1000.0000 ug | Freq: Once | INTRAMUSCULAR | Status: AC
  Administered 2010-10-17: 1000 ug via INTRAMUSCULAR

## 2010-10-17 NOTE — Progress Notes (Signed)
Vitamin B 12 given subcutaneously to left deltoid. Tolerated well.

## 2010-10-19 ENCOUNTER — Encounter (HOSPITAL_COMMUNITY): Payer: Self-pay

## 2010-10-19 ENCOUNTER — Encounter (HOSPITAL_BASED_OUTPATIENT_CLINIC_OR_DEPARTMENT_OTHER): Payer: 59

## 2010-10-19 DIAGNOSIS — E669 Obesity, unspecified: Secondary | ICD-10-CM

## 2010-10-19 DIAGNOSIS — C189 Malignant neoplasm of colon, unspecified: Secondary | ICD-10-CM

## 2010-10-19 DIAGNOSIS — C8589 Other specified types of non-Hodgkin lymphoma, extranodal and solid organ sites: Secondary | ICD-10-CM

## 2010-10-19 DIAGNOSIS — Z1509 Genetic susceptibility to other malignant neoplasm: Secondary | ICD-10-CM

## 2010-10-19 DIAGNOSIS — C649 Malignant neoplasm of unspecified kidney, except renal pelvis: Secondary | ICD-10-CM

## 2010-10-19 DIAGNOSIS — C8590 Non-Hodgkin lymphoma, unspecified, unspecified site: Secondary | ICD-10-CM

## 2010-10-19 DIAGNOSIS — E538 Deficiency of other specified B group vitamins: Secondary | ICD-10-CM

## 2010-10-19 NOTE — Progress Notes (Deleted)
[facility] NEW PATIENT EVALUATION   Name: Nicole [name] Date: 10/19/2010 MRN: [id number] DOB: 1950-10-18    CC: Nicole Obey, MD  Nicole Obey, MD   DIAGNOSIS: There were no encounter diagnoses.   HISTORY OF PRESENT ILLNESS:Nicole [name] is a 60 y.o. female who is  {SYMPTOMS; NWGNFAO:13086} ***.   FAMILY HISTORY: family history includes Cancer in her son.   PAST MEDICAL HISTORY:  has a past medical history of Lynch syndrome (10/17/2010); Renal cell carcinoma (10/17/2010); NHL (non-Hodgkin's lymphoma) (10/17/2010); Obesity (10/17/2010); B12 deficiency (10/17/2010); and Colon cancer.       CURRENT MEDICATIONS: Ms. Port does not currently have medications on file.   SOCIAL HISTORY:  does not have a smoking history on file. She has never used smokeless tobacco. She reports that she does not use illicit drugs.     ALLERGIES: Morphine and related and Latex   LABORATORY DATA:  No results found for this or any previous visit (from the past 48 hour(s)).     RADIOGRAPHY: @RISRSLT48 @      REVIEW OF SYSTEMS: {dnt review of systems (ROS):20055}   PHYSICAL EXAM:  weight is 181 lb 6.4 oz (82.283 kg). Her oral temperature is 97.5 F (36.4 C). Her blood pressure is 130/84 and her pulse is 88.  {physical exam:21449}     IMPRESSION: ***   PLAN: ***

## 2010-10-19 NOTE — Progress Notes (Deleted)
Nicole Obey, MD [address] [place] [address]  No diagnosis found.  CURRENT THERAPY:  INTERVAL HISTORY: Nicole [name] 60 y.o. female returns for *** regular *** visit for followup of ***   Past Medical History  Diagnosis Date  . Lynch syndrome 10/17/2010  . Renal cell carcinoma 10/17/2010  . NHL (non-Hodgkin's lymphoma) 10/17/2010  . Obesity 10/17/2010  . B12 deficiency 10/17/2010  . Colon cancer     has Lynch syndrome; Renal cell carcinoma; NHL (non-Hodgkin's lymphoma); Obesity; and B12 deficiency on her problem list.     is allergic to morphine and related and latex.  Nicole [name] does not currently have medications on file.  Past Surgical History  Procedure Date  . Abdominal hysterectomy   . Hemicolectomy 2001    right  . Hemicolectomy 2002    left  . Kidney resection 2007    left partial  . Subtotal colectomy 2007    *** denies any headaches, dizziness, double vision, fevers, chills, night sweats, nausea, vomiting, diarrhea, constipation, chest pain, heart palpitations, shortness of breath, blood in stool, black tarry stool, urinary pain, urinary burning, urinary frequency, hematuria.   PHYSICAL EXAMINATION  ECOG PERFORMANCE STATUS: {CHL ONC ECOG [phone]}  Filed Vitals:   10/19/10 1456  BP: 130/84  Pulse: 88  Temp: 97.5 F (36.4 C)    GENERAL:alert, healthy and no distress SKIN: skin color, texture, turgor are normal HEAD: Normocephalic EYES: normal, PERRLA, EOMI EARS: {CHL ONC PE UEAV:4098119147} OROPHARYNX:{CHL ONC PE OROPHARYNX:951-089-2400}  NECK: {CHL ONC PE WGNF:6213086578} LYMPH:  {CHL ONC PE IONGE:9528413244} BREAST:{CHL ONC PE BREAST:[phone]} LUNGS: {CHL ONC PE WNUUV:2536644034} HEART: {CHL ONC PE VQQVZ:5638756433} ABDOMEN:{CHL ONC PE ABDOMEN:[phone]} BACK: {CHL ONC PE IRJJ:8841660630} EXTREMITIES:{CHL ONC PE [phone]}  NEURO: {CHL ONC PE NEURO:[phone]} PELVIC:{CHL ONC PE  PELVIC:[phone]} RECTAL: {CHL ONC PE [phone]}    LABORATORY DATA:  {*** HELP TEXT ***  This SmartLink requires parameters for processing. Parameters are variables that can be added to the original SmartLink name. This allows the user to request specific information by giving the SmartLink precise direction.  The LabLast SmartLink accepts a list of result component base names separated by commas. The user can also request the number of results to display for each component. To indicate the number of results for each component, type the component name followed by a colon and then the number of results. (Component name:4). For all results for that particular component, type a star in place of the number (Component name:*). To obtain the last result for a particular component, type the component base name without the colon, number, or star. Ex: .LabLast[RBC. Depending on how the SmartLink is setup, results may be limited by a lookback period. In this case, any results older than the lookback period will not be displayed.  The SmartLink will display the name of the component(s) with the units used next to the name followed by a table listing the date and the result value for each result requested.  Example: .LabLast[MCH:3,MCV:*,HCT   This example would display a table for all components whose base name matches '[facility]', 'MCV', or 'HCT'. The first would contain the last three results of each component that has a base name of [facility], the second would contain all the entered results for components with MCV as the base name, and the third would contain the last entered result for all components with a base name of HCT.}   PENDING LABS:   RADIOGRAPHIC STUDIES:  @RISRSLT48 @  PATHOLOGY:    ASSESSMENT: ***   PLAN: ***   All questions were answered. The patient knows to call the clinic with any problems, questions or concerns. We can certainly see the patient much sooner if  necessary.  The patient and plan discussed with {CHL ONC MEDICAL ONCOLOGISTS:[phone]} and he is in agreement with the aforementioned.  I spent {CHL ONC TIME VISIT - VFIEP:3295188416} counseling the patient face to face. The total time spent in the appointment was {CHL ONC TIME VISIT - SAYTK:1601093235}.  Nicole [name] K.

## 2010-10-19 NOTE — Progress Notes (Signed)
Note dictated

## 2010-10-22 NOTE — Progress Notes (Signed)
CC:   Nicole [name]. Sudie Bailey, M.D. Nicole Hurt, MD Nicole [name], M.D. Nicole [name]. Dahlstedt, M.D.  DIAGNOSES:  A 60 year old female with: 1. Renal cell carcinoma. 2. Non-Hodgkin lymphoma. 3. Lynch syndrome. 4. Obesity. 5. B12 deficiency.  CURRENT THERAPY:  Observation.  INTERVAL HISTORY:  The patient is seen in followup today.  She had has been seeing Dr. Glenford [name] on a yearly basis and this is her yearly visit.  She also continues to get monthly B12 injections.  Overall she has been doing extremely well.  She is without any significant complaints.  She denies any fevers, chills, night sweats, headaches, shortness of breath, chest pains, palpitations.  She has no myalgias or arthralgias.  All of her mammograms are up to date and her colonoscopies are up to date.  Remainder of the 10-point review of systems is negative.  ALLERGIES:  Latex.  CURRENT MEDICATIONS:  Biotin, cholecalciferol, vitamin B12 injections, calcium gluconate, Lomotil, ibuprofen.  PERFORMANCE STATUS:  ECOG performance status is zero.  PHYSICAL EXAM:  General:  The patient is awake, alert, in no acute distress.  She appears well.  Vital Signs:  Temperature 97.5, pulse 88, respirations 20, blood pressure 130/84 and weight is 181 pounds.  HEENT: EOMI.  PERRLA.  Sclerae anicteric.  No conjunctival pallor.  Oral mucosa is moist.  Neck:  Supple.  Lungs:  Clear bilaterally to auscultation and percussion.  Cardiovascular:  Regular rate rhythm.  No murmurs. Abdomen:  Soft, nontender, nondistended.  Bowel sounds are present.  No HSM.  Extremities:  No clubbing, edema or cyanosis.  Neuro:  Patient is alert, oriented, otherwise nonfocal.  LABORATORY DATA:  No labs were performed at this encounter.  On 10/17/2010, she had a hemoglobin of 12.7, hematocrit 38, platelets 209,000 and white count was 8.7.  Electrolytes and LFTs were all normal. Albumin was low at 3.4.  IMPRESSION AND PLAN:  A 60 year old female with  Lynch syndrome, renal cell carcinoma and colon carcinoma and non-Hodgkin's lymphoma.  The patient is without clinical evidence of recurrent disease at this time. She also has B12 deficiency and she does get monthly injections of B12. All of her orders are in the computer system.  She will continue to be seeing [place] on a yearly basis.    ______________________________ Drue Second, M.D. KK/MEDQ  D:  10/19/2010  T:  10/20/2010  Job:  [id number]

## 2010-11-23 ENCOUNTER — Encounter (HOSPITAL_COMMUNITY): Payer: 59 | Attending: Oncology

## 2010-11-23 VITALS — BP 157/83 | HR 93

## 2010-11-23 DIAGNOSIS — E538 Deficiency of other specified B group vitamins: Secondary | ICD-10-CM | POA: Insufficient documentation

## 2010-11-23 MED ORDER — CYANOCOBALAMIN 1000 MCG/ML IJ SOLN
1000.0000 ug | Freq: Once | INTRAMUSCULAR | Status: AC
  Administered 2010-11-23: 1000 ug via INTRAMUSCULAR

## 2010-11-23 MED ORDER — CYANOCOBALAMIN 1000 MCG/ML IJ SOLN
INTRAMUSCULAR | Status: AC
  Administered 2010-11-23: 1000 ug via INTRAMUSCULAR
  Filled 2010-11-23: qty 1

## 2010-12-11 LAB — CEA: CEA: <0.5

## 2010-12-12 LAB — COMPREHENSIVE METABOLIC PANEL
ALT: 23
Albumin: 3.8
Alkaline Phosphatase: 76
Calcium: 9
GFR calc Af Amer: >60
Potassium: 3.4 — ABNORMAL LOW
Sodium: 140
Total Protein: 6.9

## 2010-12-12 LAB — VITAMIN B12: Vitamin B-12: 160 — ABNORMAL LOW (ref 211–911)

## 2010-12-12 LAB — DIFFERENTIAL
Basophils Relative: 1
Eosinophils Absolute: 0.2
Lymphs Abs: 1.7
Monocytes Absolute: 0.6
Monocytes Relative: 10
Neutrophils Relative %: 60

## 2010-12-12 LAB — INTRINSIC FACTOR ANTIBODIES: Intrinsic Factor: NEGATIVE

## 2010-12-12 LAB — METHYLMALONIC ACID, SERUM: Methylmalonic Acid, Quantitative: 331 nmol/L — ABNORMAL HIGH (ref 87–318)

## 2010-12-12 LAB — CBC
MCHC: 35.6
Platelets: 204
RDW: 13.1

## 2010-12-17 LAB — COMPREHENSIVE METABOLIC PANEL
Albumin: 3.9
BUN: 15
Calcium: 9
Creatinine, Ser: 0.74
Potassium: 4
Total Protein: 6.5

## 2010-12-17 LAB — DIFFERENTIAL
Lymphocytes Relative: 24
Lymphs Abs: 1.8
Monocytes Absolute: 0.9
Monocytes Relative: 12
Neutro Abs: 4.8

## 2010-12-17 LAB — CBC
HCT: 38.8
Hemoglobin: 13.4
Platelets: 195
RBC: 4.28
RDW: 13.1
WBC: 7.7

## 2010-12-21 ENCOUNTER — Ambulatory Visit (HOSPITAL_COMMUNITY): Payer: 59

## 2010-12-24 ENCOUNTER — Encounter (HOSPITAL_COMMUNITY): Payer: 59 | Attending: Oncology

## 2010-12-24 VITALS — BP 148/100 | HR 86

## 2010-12-24 DIAGNOSIS — E538 Deficiency of other specified B group vitamins: Secondary | ICD-10-CM | POA: Insufficient documentation

## 2010-12-24 MED ORDER — CYANOCOBALAMIN 1000 MCG/ML IJ SOLN
INTRAMUSCULAR | Status: AC
  Administered 2010-12-24: 1000 ug via INTRAMUSCULAR
  Filled 2010-12-24: qty 1

## 2010-12-24 MED ORDER — CYANOCOBALAMIN 1000 MCG/ML IJ SOLN
1000.0000 ug | Freq: Once | INTRAMUSCULAR | Status: AC
  Administered 2010-12-24: 1000 ug via INTRAMUSCULAR

## 2010-12-24 NOTE — Progress Notes (Signed)
Tolerated injection well. 

## 2010-12-27 LAB — COMPREHENSIVE METABOLIC PANEL
AST: 19
Alkaline Phosphatase: 65
BUN: 13
CO2: 32
Chloride: 108
Creatinine, Ser: 0.68
GFR calc non Af Amer: >60
Total Bilirubin: 0.6

## 2010-12-27 LAB — DIFFERENTIAL
Basophils Absolute: 0.1
Basophils Relative: 1
Eosinophils Relative: 2
Lymphocytes Relative: 23
Neutro Abs: 4.4

## 2010-12-27 LAB — CBC
HCT: 38.1
MCV: 89.7
RBC: 4.25
WBC: 7.1

## 2010-12-27 LAB — LACTATE DEHYDROGENASE: LDH: 102

## 2010-12-28 ENCOUNTER — Other Ambulatory Visit: Payer: Self-pay | Admitting: Obstetrics and Gynecology

## 2010-12-28 DIAGNOSIS — Z139 Encounter for screening, unspecified: Secondary | ICD-10-CM

## 2011-01-21 ENCOUNTER — Encounter (HOSPITAL_COMMUNITY): Payer: 59

## 2011-01-21 ENCOUNTER — Encounter (HOSPITAL_COMMUNITY): Payer: 59 | Attending: Oncology

## 2011-01-21 VITALS — BP 139/90 | HR 85

## 2011-01-21 DIAGNOSIS — E538 Deficiency of other specified B group vitamins: Secondary | ICD-10-CM | POA: Insufficient documentation

## 2011-01-21 MED ORDER — CYANOCOBALAMIN 1000 MCG/ML IJ SOLN
INTRAMUSCULAR | Status: AC
  Administered 2011-01-21: 1000 ug via INTRAMUSCULAR
  Filled 2011-01-21: qty 1

## 2011-01-21 MED ORDER — CYANOCOBALAMIN 1000 MCG/ML IJ SOLN: 1000.0000 ug | Freq: Once | INTRAMUSCULAR | Status: DC

## 2011-01-21 NOTE — Progress Notes (Signed)
Tolerated Vitamin B-12 injection well. Vitamin B-12 !000 mcg to right deltoid im z-track

## 2011-01-28 ENCOUNTER — Ambulatory Visit (HOSPITAL_COMMUNITY)
Admission: RE | Admit: 2011-01-28 | Discharge: 2011-01-28 | Disposition: A | Discharge status: Home / Self Care | Payer: 59 | Source: Ambulatory Visit | Attending: Obstetrics and Gynecology | Admitting: Obstetrics and Gynecology

## 2011-01-28 DIAGNOSIS — Z1231 Encounter for screening mammogram for malignant neoplasm of breast: Secondary | ICD-10-CM | POA: Insufficient documentation

## 2011-01-28 DIAGNOSIS — Z139 Encounter for screening, unspecified: Secondary | ICD-10-CM

## 2011-02-18 ENCOUNTER — Ambulatory Visit (HOSPITAL_COMMUNITY): Payer: 59

## 2011-02-22 ENCOUNTER — Encounter (HOSPITAL_COMMUNITY): Payer: 59 | Attending: Oncology

## 2011-02-22 VITALS — BP 136/89 | HR 81

## 2011-02-22 DIAGNOSIS — E538 Deficiency of other specified B group vitamins: Secondary | ICD-10-CM | POA: Insufficient documentation

## 2011-02-22 MED ORDER — CYANOCOBALAMIN 1000 MCG/ML IJ SOLN
1000.0000 ug | Freq: Once | INTRAMUSCULAR | Status: AC
Start: 2011-02-22 — End: 2011-02-22
  Administered 2011-02-22: 1000 ug via INTRAMUSCULAR

## 2011-02-22 MED ORDER — CYANOCOBALAMIN 1000 MCG/ML IJ SOLN
INTRAMUSCULAR | Status: AC
  Administered 2011-02-22: 1000 ug via INTRAMUSCULAR
  Filled 2011-02-22: qty 1

## 2011-02-22 NOTE — Progress Notes (Signed)
Tolerated injection well. 

## 2011-03-25 ENCOUNTER — Encounter (HOSPITAL_COMMUNITY): Payer: 59 | Attending: Oncology

## 2011-03-25 VITALS — BP 168/92 | HR 91

## 2011-03-25 DIAGNOSIS — E538 Deficiency of other specified B group vitamins: Secondary | ICD-10-CM | POA: Insufficient documentation

## 2011-03-25 MED ORDER — CYANOCOBALAMIN 1000 MCG/ML IJ SOLN
1000.0000 ug | Freq: Once | INTRAMUSCULAR | Status: AC
  Administered 2011-03-25: 1000 ug via INTRAMUSCULAR

## 2011-03-25 NOTE — Progress Notes (Signed)
Nicole [name] presents today for injection per MD orders. B12 1000mcg administered IM in right Upper Arm. Administration without incident. Patient tolerated well.  

## 2011-04-25 ENCOUNTER — Encounter (HOSPITAL_COMMUNITY): Payer: 59 | Attending: Oncology

## 2011-04-25 DIAGNOSIS — E538 Deficiency of other specified B group vitamins: Secondary | ICD-10-CM | POA: Insufficient documentation

## 2011-04-25 MED ORDER — CYANOCOBALAMIN 1000 MCG/ML IJ SOLN
INTRAMUSCULAR | Status: AC
  Filled 2011-04-25: qty 1

## 2011-04-25 MED ORDER — CYANOCOBALAMIN 1000 MCG/ML IJ SOLN
1000.0000 ug | Freq: Once | INTRAMUSCULAR | Status: AC
  Administered 2011-04-25: 1000 ug via INTRAMUSCULAR

## 2011-04-25 NOTE — Progress Notes (Signed)
Nicole [name] presents today for injection per MD orders. B12 1000mcg administered IM in left Upper Arm. Administration without incident. Patient tolerated well.  

## 2011-05-23 ENCOUNTER — Encounter (HOSPITAL_COMMUNITY): Payer: 59 | Attending: Oncology

## 2011-05-23 DIAGNOSIS — E538 Deficiency of other specified B group vitamins: Secondary | ICD-10-CM | POA: Insufficient documentation

## 2011-05-23 MED ORDER — CYANOCOBALAMIN 1000 MCG/ML IJ SOLN
INTRAMUSCULAR | Status: AC
  Filled 2011-05-23: qty 1

## 2011-05-23 MED ORDER — CYANOCOBALAMIN 1000 MCG/ML IJ SOLN
1000.0000 ug | Freq: Once | INTRAMUSCULAR | Status: AC
  Administered 2011-05-23: 1000 ug via INTRAMUSCULAR

## 2011-05-23 NOTE — Progress Notes (Signed)
Nicole [name] presents today for injection per MD orders. B12 1000mcg administered SQ in left Upper Arm. Administration without incident. Patient tolerated well.  

## 2011-06-13 ENCOUNTER — Ambulatory Visit (HOSPITAL_COMMUNITY): Payer: 59

## 2011-06-20 ENCOUNTER — Encounter (HOSPITAL_COMMUNITY): Payer: 59 | Attending: Oncology

## 2011-06-20 DIAGNOSIS — E538 Deficiency of other specified B group vitamins: Secondary | ICD-10-CM | POA: Insufficient documentation

## 2011-06-20 MED ORDER — CYANOCOBALAMIN 1000 MCG/ML IJ SOLN
1000.0000 ug | Freq: Once | INTRAMUSCULAR | Status: AC
  Administered 2011-06-20: 1000 ug via INTRAMUSCULAR

## 2011-06-20 MED ORDER — CYANOCOBALAMIN 1000 MCG/ML IJ SOLN
INTRAMUSCULAR | Status: AC
  Filled 2011-06-20: qty 1

## 2011-06-20 NOTE — Progress Notes (Signed)
Nicole [name] presents today for injection per MD orders. B12 1000mcg administered SQ in right Upper Arm. Administration without incident. Patient tolerated well.  

## 2011-07-16 ENCOUNTER — Encounter (INDEPENDENT_AMBULATORY_CARE_PROVIDER_SITE_OTHER): Payer: Self-pay | Admitting: *Deleted

## 2011-07-18 ENCOUNTER — Encounter (HOSPITAL_COMMUNITY): Payer: 59 | Attending: Oncology

## 2011-07-18 ENCOUNTER — Ambulatory Visit (HOSPITAL_COMMUNITY): Payer: 59

## 2011-07-18 DIAGNOSIS — E538 Deficiency of other specified B group vitamins: Secondary | ICD-10-CM | POA: Insufficient documentation

## 2011-07-18 MED ORDER — CYANOCOBALAMIN 1000 MCG/ML IJ SOLN
1000.0000 ug | Freq: Once | INTRAMUSCULAR | Status: AC
  Administered 2011-07-18: 1000 ug via INTRAMUSCULAR

## 2011-07-18 NOTE — Progress Notes (Signed)
Nicole [name] presents today for injection per MD orders. B12 1000mcg administered IM in left Upper Arm. Administration without incident. Patient tolerated well.  

## 2011-07-26 ENCOUNTER — Other Ambulatory Visit (INDEPENDENT_AMBULATORY_CARE_PROVIDER_SITE_OTHER): Payer: Self-pay | Admitting: *Deleted

## 2011-07-26 ENCOUNTER — Encounter (INDEPENDENT_AMBULATORY_CARE_PROVIDER_SITE_OTHER): Payer: Self-pay | Admitting: *Deleted

## 2011-07-26 DIAGNOSIS — C189 Malignant neoplasm of colon, unspecified: Secondary | ICD-10-CM

## 2011-07-31 ENCOUNTER — Telehealth (INDEPENDENT_AMBULATORY_CARE_PROVIDER_SITE_OTHER): Payer: Self-pay | Admitting: *Deleted

## 2011-07-31 NOTE — Telephone Encounter (Signed)
PCP/Requesting MD: Sudie Bailey  Name & DOB: shaquille murdy Jan 16, 2051     Procedure: flex sig  Reason/Indication:  Hx colon ca  Has patient had this procedure before?  yes  If so, when, by whom and where?  07/2010  Is there a family history of colon cancer?    Who?  What age when diagnosed?    Is patient diabetic?   no      Does patient have prosthetic heart valve?  no  Do you have a pacemaker?  no  Has patient had joint replacement within last 12 months?  no  Is patient on Coumadin, Plavix and/or Aspirin? no  Medications: advil 200 mg tid  Allergies: morphine, latex  Medication Adjustment:   Procedure date & time: 08/29/11 @ 930

## 2011-08-06 NOTE — Telephone Encounter (Signed)
agree

## 2011-08-15 ENCOUNTER — Encounter (HOSPITAL_BASED_OUTPATIENT_CLINIC_OR_DEPARTMENT_OTHER): Payer: 59

## 2011-08-15 DIAGNOSIS — E538 Deficiency of other specified B group vitamins: Secondary | ICD-10-CM

## 2011-08-15 MED ORDER — CYANOCOBALAMIN 1000 MCG/ML IJ SOLN
1000.0000 ug | Freq: Once | INTRAMUSCULAR | Status: AC
  Administered 2011-08-15: 1000 ug via INTRAMUSCULAR

## 2011-08-15 MED ORDER — CYANOCOBALAMIN 1000 MCG/ML IJ SOLN
INTRAMUSCULAR | Status: AC
  Filled 2011-08-15: qty 1

## 2011-08-15 NOTE — Progress Notes (Signed)
Nicole [name] presents today for injection per MD orders. B12 1000mcg administered IM in right Upper Arm. Administration without incident. Patient tolerated well.  

## 2011-08-26 ENCOUNTER — Encounter (HOSPITAL_COMMUNITY): Payer: Self-pay | Admitting: Pharmacy Technician

## 2011-08-28 MED ORDER — SODIUM CHLORIDE 0.45 % IV SOLN: Freq: Once | INTRAVENOUS | Status: DC

## 2011-08-29 ENCOUNTER — Encounter (HOSPITAL_COMMUNITY): Payer: Self-pay | Admitting: *Deleted

## 2011-08-29 ENCOUNTER — Ambulatory Visit (HOSPITAL_COMMUNITY)
Admission: RE | Admit: 2011-08-29 | Discharge: 2011-08-29 | Disposition: A | Discharge status: Home / Self Care | Payer: 59 | Source: Ambulatory Visit | Attending: Internal Medicine | Admitting: Internal Medicine

## 2011-08-29 ENCOUNTER — Encounter (HOSPITAL_COMMUNITY)
Admission: RE | Disposition: A | Discharge status: Home / Self Care | Payer: Self-pay | Source: Ambulatory Visit | Attending: Internal Medicine

## 2011-08-29 DIAGNOSIS — Z87898 Personal history of other specified conditions: Secondary | ICD-10-CM | POA: Insufficient documentation

## 2011-08-29 DIAGNOSIS — Z09 Encounter for follow-up examination after completed treatment for conditions other than malignant neoplasm: Secondary | ICD-10-CM | POA: Insufficient documentation

## 2011-08-29 DIAGNOSIS — Z1509 Genetic susceptibility to other malignant neoplasm: Secondary | ICD-10-CM

## 2011-08-29 DIAGNOSIS — Z85038 Personal history of other malignant neoplasm of large intestine: Secondary | ICD-10-CM | POA: Insufficient documentation

## 2011-08-29 DIAGNOSIS — C189 Malignant neoplasm of colon, unspecified: Secondary | ICD-10-CM

## 2011-08-29 DIAGNOSIS — Z8553 Personal history of malignant neoplasm of renal pelvis: Secondary | ICD-10-CM | POA: Insufficient documentation

## 2011-08-29 DIAGNOSIS — Z9049 Acquired absence of other specified parts of digestive tract: Secondary | ICD-10-CM | POA: Insufficient documentation

## 2011-08-29 HISTORY — PX: FLEXIBLE SIGMOIDOSCOPY: SHX5431

## 2011-08-29 SURGERY — SIGMOIDOSCOPY, FLEXIBLE
Anesthesia: Moderate Sedation

## 2011-08-29 MED ORDER — DIPHENOXYLATE-ATROPINE 2.5-0.025 MG PO TABS: 1.0000 | ORAL_TABLET | Freq: Three times a day (TID) | ORAL | Status: AC

## 2011-08-29 MED ORDER — STERILE WATER FOR IRRIGATION IR SOLN
Status: DC | PRN
  Administered 2011-08-29: 10:00:00

## 2011-08-29 NOTE — H&P (Signed)
Nicole [name] is an 61 y.o. female.   Chief Complaint: Patient is in for flexible sigmoidoscopy. HPI: Patient is 61 year old Caucasian female with history of Lynch syndrome and has had most of her colon removed except rectum and rectosigmoid junction. She declined to have  APR with her last surgery. Last surveillance exam was in may 2012. She has chronic diarrhea for which he uses Lomotil on when necessary basis. She denies abdominal pain or rectal bleeding.  Past Medical History  Diagnosis Date  . Lynch syndrome 10/17/2010  . NHL (non-Hodgkin's lymphoma) 10/17/2010  . Obesity 10/17/2010  . B12 deficiency 10/17/2010  . Colon cancer   . Renal cell carcinoma 2007    Past Surgical History  Procedure Date  . Abdominal hysterectomy   . Hemicolectomy 2001    right  . Hemicolectomy 2002    left  . Kidney resection 2007    left partial  . Subtotal colectomy 2007    Family History  Problem Relation Age of Onset  . Cancer Son    Social History:  does not have a smoking history on file. She has never used smokeless tobacco. She reports that she does not use illicit drugs. Her alcohol history not on file.  Allergies:  Allergies  Allergen Reactions  . Latex Rash  . Morphine And Related Rash and Other (See Comments)    hallucinations    Medications Prior to Admission  Medication Sig Dispense Refill  . diphenoxylate-atropine (LOMOTIL) 2.5-0.025 MG per tablet Take 1 tablet by mouth 4 (four) times daily as needed. Diarrhea      . ibuprofen (ADVIL,MOTRIN) 200 MG tablet Take 400 mg by mouth every 6 (six) hours as needed.       Marland Kitchen VITAMIN B1-B12 IJ Inject 1,000 mcg as directed every 30 (thirty) days.          No results found for this or any previous visit (from the past 48 hour(s)). No results found.  ROS  Blood pressure 151/81, pulse 80, temperature 98.3 F (36.8 C), temperature source Oral, resp. rate 18, height 5\' 1"  (1.549 m), weight 180 lb (81.647 kg), SpO2 95.00%. Physical Exam    Assessment/Plan History of colon carcinoma. Lynch syndrome. Flexible sigmoidoscopy.  Nicole [name] U 08/29/2011, 9:28 AM

## 2011-08-29 NOTE — Discharge Instructions (Signed)
Resume usual diet and medications. Take Lomotil one to 2 tablets by mouth daily before breakfast and 1 before lunch. Next flexible sigmoidoscopy in one year.

## 2011-08-29 NOTE — Op Note (Signed)
FLEXIBLE SIGMOIDOSCOPY  PROCEDURE REPORT  PATIENT:  Nicole [name]  MR#:  [id number] Birthdate:  11/28/1950, 61 y.o., female Endoscopist:  Dr. Malissa Hippo, MD Referred By:  Dr. Mila Homer. Sudie Bailey, MD Procedure Date: 08/29/2011  Procedure:   Flexible sigmoidoscopy.  Indications: Patient is 61 year old Caucasian female with history of colon carcinoma and limb syndrome who is undergoing surveillance examination. She only has rectum and rectosigmoid junction remaining. Last exam was in may 2012.  Informed Consent:  The procedure and risks were reviewed with the patient and informed consent was obtained.  Medications:  None  Description of procedure:  Patient was placed in left lateral recumbent position and rectal examination performed. No abnormality noted an external addition exam. Pentex he does go space rectum and advanced and rectosigmoid junction variceal colonic anastomosis is identified. It was patent. Distal small bowel mucosa was normal. As the scope was withdrawn rectosigmoid junction and rectal mucosa was carefully examined along with retroflexion of the scope. Findings:   Normal distal small bowel. Normal ileocolonic anastomosis at rectosigmoid junction. Normal rectal mucosa.  Therapeutic/Diagnostic Maneuvers Performed:  None  Complications:  None   Impression:  Normal flexible sigmoidoscopy.  Recommendations:  Next exam in one year.  Dede Dobesh U  08/29/2011 9:42 AM  CC: Dr. [name], MD & Dr. Bonnetta Barry ref. provider found

## 2011-09-09 ENCOUNTER — Encounter (HOSPITAL_COMMUNITY): Payer: Self-pay | Admitting: Internal Medicine

## 2011-09-12 ENCOUNTER — Encounter (HOSPITAL_COMMUNITY): Payer: 59 | Attending: Oncology

## 2011-09-12 DIAGNOSIS — E538 Deficiency of other specified B group vitamins: Secondary | ICD-10-CM | POA: Insufficient documentation

## 2011-09-12 MED ORDER — CYANOCOBALAMIN 1000 MCG/ML IJ SOLN
1000.0000 ug | Freq: Once | INTRAMUSCULAR | Status: AC
  Administered 2011-09-12: 1000 ug via INTRAMUSCULAR

## 2011-09-12 MED ORDER — CYANOCOBALAMIN 1000 MCG/ML IJ SOLN
INTRAMUSCULAR | Status: AC
  Filled 2011-09-12: qty 1

## 2011-09-12 NOTE — Progress Notes (Signed)
Nicole [name] presents today for injection per the provider's orders.  Vitamin B12 administered administration without incident; see MAR for injection details.  Patient tolerated procedure well and without incident.  No questions or complaints noted at this time.

## 2011-09-23 ENCOUNTER — Telehealth (HOSPITAL_COMMUNITY): Payer: Self-pay | Admitting: Oncology

## 2011-10-07 ENCOUNTER — Encounter (HOSPITAL_COMMUNITY)
Admission: RE | Admit: 2011-10-07 | Discharge: 2011-10-07 | Disposition: A | Discharge status: Home / Self Care | Payer: 59 | Source: Ambulatory Visit | Attending: Orthopedic Surgery | Admitting: Orthopedic Surgery

## 2011-10-07 ENCOUNTER — Ambulatory Visit (HOSPITAL_COMMUNITY)
Admission: RE | Admit: 2011-10-07 | Discharge: 2011-10-07 | Disposition: A | Discharge status: Home / Self Care | Payer: 59 | Source: Ambulatory Visit | Attending: Orthopedic Surgery | Admitting: Orthopedic Surgery

## 2011-10-07 ENCOUNTER — Encounter (HOSPITAL_COMMUNITY): Payer: Self-pay

## 2011-10-07 DIAGNOSIS — Z01812 Encounter for preprocedural laboratory examination: Secondary | ICD-10-CM | POA: Insufficient documentation

## 2011-10-07 DIAGNOSIS — Z01818 Encounter for other preprocedural examination: Secondary | ICD-10-CM | POA: Insufficient documentation

## 2011-10-07 LAB — BASIC METABOLIC PANEL
BUN: 16 mg/dL (ref 6–23)
CO2: 25 mEq/L (ref 19–32)
Chloride: 104 mEq/L (ref 96–112)
Glucose, Bld: 88 mg/dL (ref 70–99)
Potassium: 4.3 mEq/L (ref 3.5–5.1)
Sodium: 139 mEq/L (ref 135–145)

## 2011-10-07 LAB — CBC
HCT: 36.9 % (ref 36.0–46.0)
Hemoglobin: 12.3 g/dL (ref 12.0–15.0)
MCH: 30.2 pg (ref 26.0–34.0)
MCHC: 33.3 g/dL (ref 30.0–36.0)
MCV: 90.7 fL (ref 78.0–100.0)
RBC: 4.07 MIL/uL (ref 3.87–5.11)

## 2011-10-07 LAB — SURGICAL PCR SCREEN
MRSA, PCR: NEGATIVE
Staphylococcus aureus: NEGATIVE

## 2011-10-07 MED ORDER — CHLORHEXIDINE GLUCONATE 4 % EX LIQD: 60.0000 mL | Freq: Once | CUTANEOUS | Status: DC

## 2011-10-07 NOTE — Pre-Procedure Instructions (Signed)
20 Nicole [name]  10/07/2011   Your procedure is scheduled on:  Thursday October 17, 2011.  Report to [facility] at 1130 AM.  Call this number if you have problems the morning of surgery: [phone]   Remember:   Do not eat food or drink:After Midnight.    Take these medicines the morning of surgery with A SIP OF WATER: None   Do not wear jewelry, make-up or nail polish.  Do not wear lotions, powders, or perfumes. .  Do not shave 48 hours prior to surgery.   Do not bring valuables to the hospital.  Contacts, dentures or bridgework may not be worn into surgery.  Leave suitcase in the car. After surgery it may be brought to your room.  For patients admitted to the hospital, checkout time is 11:00 AM the day of discharge.   Patients discharged the day of surgery will not be allowed to drive home.  Name and phone number of your driver:   Special Instructions: CHG Shower Use Special Wash: 1/2 bottle night before surgery and 1/2 bottle morning of surgery.   Please read over the following fact sheets that you were given: Pain Booklet, Coughing and Deep Breathing, MRSA Information and Surgical Site Infection Prevention

## 2011-10-09 ENCOUNTER — Encounter (HOSPITAL_COMMUNITY): Payer: 59 | Attending: Oncology | Admitting: Oncology

## 2011-10-09 ENCOUNTER — Encounter (HOSPITAL_COMMUNITY): Payer: Self-pay | Admitting: Oncology

## 2011-10-09 VITALS — BP 139/89 | HR 85 | Temp 98.5°F | Wt 188.4 lb

## 2011-10-09 DIAGNOSIS — C189 Malignant neoplasm of colon, unspecified: Secondary | ICD-10-CM

## 2011-10-09 DIAGNOSIS — Z1509 Genetic susceptibility to other malignant neoplasm: Secondary | ICD-10-CM | POA: Insufficient documentation

## 2011-10-09 DIAGNOSIS — Z800 Family history of malignant neoplasm of digestive organs: Secondary | ICD-10-CM | POA: Insufficient documentation

## 2011-10-09 DIAGNOSIS — C8590 Non-Hodgkin lymphoma, unspecified, unspecified site: Secondary | ICD-10-CM

## 2011-10-09 DIAGNOSIS — C8589 Other specified types of non-Hodgkin lymphoma, extranodal and solid organ sites: Secondary | ICD-10-CM | POA: Insufficient documentation

## 2011-10-09 DIAGNOSIS — D510 Vitamin B12 deficiency anemia due to intrinsic factor deficiency: Secondary | ICD-10-CM

## 2011-10-09 DIAGNOSIS — E538 Deficiency of other specified B group vitamins: Secondary | ICD-10-CM | POA: Insufficient documentation

## 2011-10-09 NOTE — Progress Notes (Signed)
Problem #1 Lynch syndrome with a history of renal cell carcinoma non-Hodgkin's lymphoma and 3 colon cancers thus far no recurrences.  Her non-Hodgkin's lymphoma follicular well-differentiated try CD20 positive grade 1 was found at time of for sigmoid colon resection in 2007 with involvement of 2 lymph nodes at that time. She has no B. symptomatology.  Colon cancer status post subtotal colectomy Dr. Byrd Hesselbach in [place] was 12/11/2005 and she has sigmoidoscopy once a year by Dr. Karilyn Cota we will do CEAs just once a year.  Her kidney cancer status post left partial nephrectomy by Dr.Dahlstedt and she has not had a CAT scan in over a year. We will set that up CT abdomen and pelvis with contrast.  Other than that she's gained some weight doesn't get much exercise as she should and has quite a bit of stress with her job. Her diet is also did not the best. Vital signs are in the chart her weight is up 7 pounds since last year. Lymph nodes remain negative throughout including cervical supraclavicular infraclavicular axillary and inguinal areas. She does not have arm or leg edema. Breast exam is negative bilaterally. She is up-to-date on mammography. Her lungs are clear. Heart shows a regular rhythm and rate without murmur rub or gallop. Abdomen is obese but without hepatosplenomegaly bowel sounds are normal and there no masses and no ascites.  She is also status post vaginal hysterectomy with bilateral oophorectomy in 1993 by Dr. Kate Sable.  We will see her back in a year. She also has a diagnosis of pernicious anemia with B12 deficiency and we'll continue monthly B12 injections. She is about to have right shoulder surgery by Dr. supple in [place]

## 2011-10-09 NOTE — Patient Instructions (Addendum)
Nicole [name]  [id number] May 18, 1950 Dr. [name]   [facility]  Discharge Instructions  RECOMMENDATIONS MADE BY THE CONSULTANT AND ANY TEST RESULTS WILL BE SENT TO YOUR REFERRING DOCTOR.   EXAM FINDINGS BY MD TODAY AND SIGNS AND SYMPTOMS TO REPORT TO CLINIC OR PRIMARY MD: you are doing well.  No evidence of recurrence by exam.  Report night sweats, fevers, recurring infections, shortness of breath, changes in bowel habits, blood in stool, etc.  MEDICATIONS PRESCRIBED: none   INSTRUCTIONS GIVEN AND DISCUSSED: Other:  CT of Abdomen and Pelvis - Prep instructions given.  SPECIAL INSTRUCTIONS/FOLLOW-UP: Lab work Needed today, CTs of abdomen and Pelvis and Return to Clinic in 1 year to see MD.   I acknowledge that I have been informed and understand all the instructions given to me and received a copy. I do not have any more questions at this time, but understand that I may call the Specialty Clinic at [facility] at [phone] during business hours should I have any further questions or need assistance in obtaining follow-up care.    __________________________________________  _____________  __________ Signature of Patient or Authorized Representative            Date                   Time    __________________________________________ Nurse's Signature

## 2011-10-10 ENCOUNTER — Ambulatory Visit (HOSPITAL_COMMUNITY): Payer: 59

## 2011-10-10 ENCOUNTER — Encounter (HOSPITAL_BASED_OUTPATIENT_CLINIC_OR_DEPARTMENT_OTHER): Payer: 59

## 2011-10-10 VITALS — BP 128/86 | HR 86

## 2011-10-10 DIAGNOSIS — E538 Deficiency of other specified B group vitamins: Secondary | ICD-10-CM

## 2011-10-10 LAB — CEA: CEA: 1.7 ng/mL (ref 0.0–5.0)

## 2011-10-10 MED ORDER — CYANOCOBALAMIN 1000 MCG/ML IJ SOLN
1000.0000 ug | Freq: Once | INTRAMUSCULAR | Status: AC
  Administered 2011-10-10: 1000 ug via INTRAMUSCULAR

## 2011-10-10 MED ORDER — CYANOCOBALAMIN 1000 MCG/ML IJ SOLN
INTRAMUSCULAR | Status: AC
  Filled 2011-10-10: qty 1

## 2011-10-10 NOTE — Progress Notes (Signed)
Emilya B [name] presents today for injection per MD orders. B12 1000mcg administered IM in left Upper Arm. Administration without incident. Patient tolerated well.  

## 2011-10-15 ENCOUNTER — Ambulatory Visit (HOSPITAL_COMMUNITY)
Admission: RE | Admit: 2011-10-15 | Discharge: 2011-10-15 | Disposition: A | Discharge status: Home / Self Care | Payer: 59 | Source: Ambulatory Visit | Attending: Oncology | Admitting: Oncology

## 2011-10-15 DIAGNOSIS — Z905 Acquired absence of kidney: Secondary | ICD-10-CM | POA: Insufficient documentation

## 2011-10-15 DIAGNOSIS — Z800 Family history of malignant neoplasm of digestive organs: Secondary | ICD-10-CM | POA: Insufficient documentation

## 2011-10-15 DIAGNOSIS — C8590 Non-Hodgkin lymphoma, unspecified, unspecified site: Secondary | ICD-10-CM

## 2011-10-15 DIAGNOSIS — C189 Malignant neoplasm of colon, unspecified: Secondary | ICD-10-CM | POA: Insufficient documentation

## 2011-10-15 DIAGNOSIS — C8589 Other specified types of non-Hodgkin lymphoma, extranodal and solid organ sites: Secondary | ICD-10-CM | POA: Insufficient documentation

## 2011-10-15 DIAGNOSIS — Z1509 Genetic susceptibility to other malignant neoplasm: Secondary | ICD-10-CM | POA: Insufficient documentation

## 2011-10-15 DIAGNOSIS — R599 Enlarged lymph nodes, unspecified: Secondary | ICD-10-CM | POA: Insufficient documentation

## 2011-10-15 MED ORDER — IOHEXOL 300 MG/ML  SOLN
100.0000 mL | Freq: Once | INTRAMUSCULAR | Status: AC | PRN
  Administered 2011-10-15: 100 mL via INTRAVENOUS

## 2011-10-16 MED ORDER — LACTATED RINGERS IV SOLN: INTRAVENOUS | Status: DC

## 2011-10-16 MED ORDER — DEXTROSE 5 % IV SOLN
3.0000 g | INTRAVENOUS | Status: DC
  Filled 2011-10-16: qty 3000

## 2011-10-16 NOTE — Progress Notes (Signed)
NOTIFIED PATIENT  (Nicole [name]) OF TIME CHANGE.  INSTRUCTED PATIENT TO ARRIVE AT 1200 PM 10/17/11

## 2011-10-17 ENCOUNTER — Encounter (HOSPITAL_COMMUNITY): Payer: Self-pay | Admitting: *Deleted

## 2011-10-17 ENCOUNTER — Ambulatory Visit (HOSPITAL_COMMUNITY): Payer: 59 | Admitting: Oncology

## 2011-10-17 ENCOUNTER — Encounter (HOSPITAL_COMMUNITY)
Admission: RE | Disposition: A | Discharge status: Home / Self Care | Payer: Self-pay | Source: Ambulatory Visit | Attending: Orthopedic Surgery

## 2011-10-17 ENCOUNTER — Ambulatory Visit (HOSPITAL_COMMUNITY): Payer: 59 | Admitting: *Deleted

## 2011-10-17 ENCOUNTER — Ambulatory Visit (HOSPITAL_COMMUNITY)
Admission: RE | Admit: 2011-10-17 | Discharge: 2011-10-17 | Disposition: A | Discharge status: Home / Self Care | Payer: 59 | Source: Ambulatory Visit | Attending: Orthopedic Surgery | Admitting: Orthopedic Surgery

## 2011-10-17 DIAGNOSIS — M1990 Unspecified osteoarthritis, unspecified site: Secondary | ICD-10-CM | POA: Insufficient documentation

## 2011-10-17 DIAGNOSIS — M67919 Unspecified disorder of synovium and tendon, unspecified shoulder: Secondary | ICD-10-CM | POA: Insufficient documentation

## 2011-10-17 DIAGNOSIS — M25819 Other specified joint disorders, unspecified shoulder: Secondary | ICD-10-CM | POA: Insufficient documentation

## 2011-10-17 DIAGNOSIS — Z9889 Other specified postprocedural states: Secondary | ICD-10-CM

## 2011-10-17 DIAGNOSIS — M719 Bursopathy, unspecified: Secondary | ICD-10-CM | POA: Insufficient documentation

## 2011-10-17 DIAGNOSIS — M75101 Unspecified rotator cuff tear or rupture of right shoulder, not specified as traumatic: Secondary | ICD-10-CM | POA: Diagnosis present

## 2011-10-17 SURGERY — SHOULDER ARTHROSCOPY WITH SUBACROMIAL DECOMPRESSION AND BICEP TENDON REPAIR
Anesthesia: Regional | Laterality: Right

## 2011-10-17 SURGERY — SHOULDER ARTHROSCOPY WITH SUBACROMIAL DECOMPRESSION AND BICEP TENDON REPAIR
Anesthesia: General | Site: Shoulder | Laterality: Right | Wound class: Clean

## 2011-10-17 MED ORDER — NEOSTIGMINE METHYLSULFATE 1 MG/ML IJ SOLN
INTRAMUSCULAR | Status: DC | PRN
  Administered 2011-10-17: 3 mg via INTRAVENOUS

## 2011-10-17 MED ORDER — GLYCOPYRROLATE 0.2 MG/ML IJ SOLN
INTRAMUSCULAR | Status: DC | PRN
  Administered 2011-10-17: 0.4 mg via INTRAVENOUS

## 2011-10-17 MED ORDER — LACTATED RINGERS IV SOLN
INTRAVENOUS | Status: DC | PRN
  Administered 2011-10-17 (×2): via INTRAVENOUS

## 2011-10-17 MED ORDER — SODIUM CHLORIDE 0.9 % IR SOLN
Status: DC | PRN
  Administered 2011-10-17: 12000 mL

## 2011-10-17 MED ORDER — HYDROMORPHONE HCL PF 1 MG/ML IJ SOLN
INTRAMUSCULAR | Status: AC
  Filled 2011-10-17: qty 1

## 2011-10-17 MED ORDER — TEMAZEPAM 30 MG PO CAPS: 30.0000 mg | ORAL_CAPSULE | Freq: Every evening | ORAL | Status: DC | PRN

## 2011-10-17 MED ORDER — ONDANSETRON HCL 4 MG/2ML IJ SOLN
INTRAMUSCULAR | Status: AC
  Filled 2011-10-17: qty 2

## 2011-10-17 MED ORDER — MIDAZOLAM HCL 5 MG/5ML IJ SOLN
INTRAMUSCULAR | Status: DC | PRN
  Administered 2011-10-17: 2 mg via INTRAVENOUS
  Administered 2011-10-17: 1 mg via INTRAVENOUS

## 2011-10-17 MED ORDER — HYDROMORPHONE HCL 2 MG PO TABS: 2.0000 mg | ORAL_TABLET | ORAL | Status: AC | PRN

## 2011-10-17 MED ORDER — HYDROMORPHONE HCL PF 1 MG/ML IJ SOLN
0.2500 mg | INTRAMUSCULAR | Status: DC | PRN
  Administered 2011-10-17 (×2): 0.5 mg via INTRAVENOUS

## 2011-10-17 MED ORDER — ONDANSETRON HCL 4 MG/2ML IJ SOLN
4.0000 mg | Freq: Once | INTRAMUSCULAR | Status: AC | PRN
  Administered 2011-10-17: 4 mg via INTRAVENOUS

## 2011-10-17 MED ORDER — ONDANSETRON HCL 4 MG/2ML IJ SOLN
INTRAMUSCULAR | Status: DC | PRN
  Administered 2011-10-17: 4 mg via INTRAVENOUS

## 2011-10-17 MED ORDER — PROPOFOL 10 MG/ML IV EMUL
INTRAVENOUS | Status: DC | PRN
  Administered 2011-10-17 (×2): 100 mg via INTRAVENOUS

## 2011-10-17 MED ORDER — ROPIVACAINE HCL 5 MG/ML IJ SOLN
INTRAMUSCULAR | Status: DC | PRN
  Administered 2011-10-17: 15 mL via EPIDURAL

## 2011-10-17 MED ORDER — ROCURONIUM BROMIDE 100 MG/10ML IV SOLN
INTRAVENOUS | Status: DC | PRN
  Administered 2011-10-17: 50 mg via INTRAVENOUS

## 2011-10-17 MED ORDER — FENTANYL CITRATE 0.05 MG/ML IJ SOLN
INTRAMUSCULAR | Status: DC | PRN
  Administered 2011-10-17 (×4): 50 ug via INTRAVENOUS
  Administered 2011-10-17: 100 ug via INTRAVENOUS

## 2011-10-17 MED ORDER — NAPROXEN 500 MG PO TABS: 500.0000 mg | ORAL_TABLET | Freq: Two times a day (BID) | ORAL | Status: DC

## 2011-10-17 MED ORDER — BUPIVACAINE-EPINEPHRINE PF 0.5-1:200000 % IJ SOLN
INTRAMUSCULAR | Status: DC | PRN
  Administered 2011-10-17: 20 mL

## 2011-10-17 MED ORDER — DIAZEPAM 5 MG PO TABS: 5.0000 mg | ORAL_TABLET | Freq: Four times a day (QID) | ORAL | Status: AC | PRN

## 2011-10-17 MED ORDER — LIDOCAINE HCL (CARDIAC) 20 MG/ML IV SOLN
INTRAVENOUS | Status: DC | PRN
  Administered 2011-10-17: 100 mg via INTRAVENOUS

## 2011-10-17 SURGICAL SUPPLY — 55 items
BLADE CUTTER GATOR 3.5 C9264 ×2 IMPLANT
BLADE GREAT WHITE 4.2 9299A ×2 IMPLANT
BLADE SURG 11 STRL SS (BLADE) ×2 IMPLANT
BUR 3.5 ×2 IMPLANT
BUR OVAL 4.0 9101 ×2 IMPLANT
CANNULA ACUFLEX KIT 5X76 ×2 IMPLANT
CLOTH BEACON ORANGE TIMEOUT ST (SAFETY) ×2 IMPLANT
CONNECTOR 5 IN 1 STRAIGHT STRL (MISCELLANEOUS) ×2 IMPLANT
CONTAINER LVC ×2 IMPLANT
DRAPE INCISE 23X17 IOBAN STRL (DRAPES)
DRAPE INCISE IOBAN 23X17 STRL (DRAPES) IMPLANT
DRAPE INCISE IOBAN 66X45 STRL (DRAPES) ×4 IMPLANT
DRAPE STERI U SHAPE 1015 ×2 IMPLANT
DRAPE STERI U SHAPE 1067 ×2 IMPLANT
DRI LOK CANNULA ×6 IMPLANT
DRSG PAD ABDOMINAL 8X10 ST (GAUZE/BANDAGES/DRESSINGS) ×4 IMPLANT
DURAPREP 26ML APPLICATOR (WOUND CARE) ×4 IMPLANT
ELECT REM PT RETURN 9FT ADLT (ELECTROSURGICAL) ×2
ELECTRODE REM PT RTRN 9FT ADLT (ELECTROSURGICAL) ×1 IMPLANT
GLOVE BIOGEL PI IND STRL 6.5 (GLOVE) ×2 IMPLANT
GLOVE BIOGEL PI INDICATOR 6.5 (GLOVE) ×2
GLOVE EXAM NITRILE MD LF STRL (GLOVE) ×6 IMPLANT
GLOVE INDICATOR 7.0 STRL GRN (GLOVE) ×2 IMPLANT
GLOVE INDICATOR 7.5 STRL GRN (GLOVE) ×2 IMPLANT
GLOVE SURG SS PI 6.5 STRL IVOR (GLOVE) ×4 IMPLANT
GLOVE SURG SS PI 7.0 STRL IVOR (GLOVE) ×2 IMPLANT
GLOVE SURG SS PI 7.5 STRL IVOR (GLOVE) ×2 IMPLANT
GOWN PREVENTION PLUS XLG XLONG ×4 IMPLANT
GOWN STRL NON-REIN LRG LVL3 (GOWN DISPOSABLE) ×2 IMPLANT
KIT BASIN OR (CUSTOM PROCEDURE TRAY) ×2 IMPLANT
KIT ROOM TURNOVER OR (KITS) ×2 IMPLANT
KIT SHOULDER TRACTION ×2 IMPLANT
MANIFOLD NEPTUNE II ×2 IMPLANT
NDL SUT 6 .5 CRC .975X.05 MAYO (NEEDLE) ×1 IMPLANT
NEEDLE MAYO TAPER (NEEDLE) ×2
NEEDLE SPNL 18GX3.5 QUINCKE PK (NEEDLE) ×2 IMPLANT
NS IRRIG 1000ML POUR BTL (IV SOLUTION) ×2 IMPLANT
PACK SHOULDER ×2 IMPLANT
PAD ARMBOARD 7.5X6 YLW CONV (MISCELLANEOUS) ×4 IMPLANT
SET ARTHROSCOPY TUBING ×2 IMPLANT
SLING ARM IMMOBILIZER ×2 IMPLANT
SPONGE GAUZE 4X4 12PLY (GAUZE/BANDAGES/DRESSINGS) ×2 IMPLANT
SPONGE LAP 4X18 X RAY DECT (DISPOSABLE) ×2 IMPLANT
SUT MNCRL AB 3-0 PS2 18 (SUTURE) ×2 IMPLANT
SUT PDS AB 0 CT 36 (SUTURE) IMPLANT
SUT RETRIEVER GRASP 30 DEG (SUTURE) ×2 IMPLANT
SUTRURE ANCHOR PEEK CORKSCREW 5.5X14.7MM ×2 IMPLANT
SYR 20CC LL (SYRINGE) ×2 IMPLANT
Suture anchor push lock 4.5 x 24mm ×4 IMPLANT
TAPE PAPER 3X10 WHT MICROPORE (GAUZE/BANDAGES/DRESSINGS) ×4 IMPLANT
TAPE STRIPS DRAPE STRL (GAUZE/BANDAGES/DRESSINGS) ×2 IMPLANT
TOWEL OR 17X24 6PK STRL BLUE (TOWEL DISPOSABLE) ×2 IMPLANT
TOWEL OR 17X26 10 PK STRL BLUE (TOWEL DISPOSABLE) ×2 IMPLANT
WAND SUCTION MAX 4MM 90S ×2 IMPLANT
WATER STERILE IRR 1000ML POUR (IV SOLUTION) ×2 IMPLANT

## 2011-10-17 SURGICAL SUPPLY — 54 items
BLADE CUTTER GATOR 3.5 (BLADE) ×2 IMPLANT
BLADE GREAT WHITE 4.2 (BLADE) ×2 IMPLANT
BLADE SURG 11 STRL SS (BLADE) ×2 IMPLANT
BOOTCOVER CLEANROOM LRG (PROTECTIVE WEAR) ×4 IMPLANT
BUR 3.5 LG SPHERICAL (BURR) IMPLANT
BUR OVAL 4.0 (BURR) ×2 IMPLANT
BURR 3.5 LG SPHERICAL (BURR)
CANISTER SUCT LVC 12 LTR MEDI- (MISCELLANEOUS) ×2 IMPLANT
CANNULA ACUFLEX KIT 5X76 (CANNULA) ×2 IMPLANT
CANNULA DRILOCK 5.0X75 (CANNULA) IMPLANT
CLOTH BEACON ORANGE TIMEOUT ST (SAFETY) ×2 IMPLANT
CONNECTOR 5 IN 1 STRAIGHT STRL (MISCELLANEOUS) ×2 IMPLANT
DRAPE INCISE 23X17 IOBAN STRL (DRAPES) ×1
DRAPE INCISE IOBAN 23X17 STRL (DRAPES) ×1 IMPLANT
DRAPE STERI 35X30 U-POUCH (DRAPES) ×2 IMPLANT
DRAPE SURG 17X11 SM STRL (DRAPES) ×2 IMPLANT
DRAPE U-SHAPE 47X51 STRL (DRAPES) ×2 IMPLANT
DRSG PAD ABDOMINAL 8X10 ST (GAUZE/BANDAGES/DRESSINGS) ×4 IMPLANT
DURAPREP 26ML APPLICATOR (WOUND CARE) ×2 IMPLANT
ELECT REM PT RETURN 9FT ADLT (ELECTROSURGICAL) ×2
ELECTRODE REM PT RTRN 9FT ADLT (ELECTROSURGICAL) ×1 IMPLANT
GLOVE BIO SURGEON STRL SZ7.5 (GLOVE) ×2 IMPLANT
GLOVE BIO SURGEON STRL SZ8 (GLOVE) ×2 IMPLANT
GLOVE EUDERMIC 7 POWDERFREE (GLOVE) ×2 IMPLANT
GLOVE SS BIOGEL STRL SZ 7.5 (GLOVE) ×1 IMPLANT
GLOVE SUPERSENSE BIOGEL SZ 7.5 (GLOVE) ×1
GOWN STRL NON-REIN LRG LVL3 (GOWN DISPOSABLE) ×2 IMPLANT
GOWN STRL REIN XL XLG (GOWN DISPOSABLE) ×8 IMPLANT
KIT BASIN OR (CUSTOM PROCEDURE TRAY) ×2 IMPLANT
KIT ROOM TURNOVER OR (KITS) ×2 IMPLANT
KIT SHOULDER TRACTION (DRAPES) ×2 IMPLANT
MANIFOLD NEPTUNE II (INSTRUMENTS) ×2 IMPLANT
NDL SUT 6 .5 CRC .975X.05 MAYO (NEEDLE) IMPLANT
NEEDLE MAYO TAPER (NEEDLE)
NEEDLE SPNL 18GX3.5 QUINCKE PK (NEEDLE) ×2 IMPLANT
NS IRRIG 1000ML POUR BTL (IV SOLUTION) ×2 IMPLANT
PACK SHOULDER (CUSTOM PROCEDURE TRAY) ×2 IMPLANT
PAD ARMBOARD 7.5X6 YLW CONV (MISCELLANEOUS) ×4 IMPLANT
SET ARTHROSCOPY TUBING (MISCELLANEOUS) ×1
SET ARTHROSCOPY TUBING LN (MISCELLANEOUS) ×1 IMPLANT
SLING ARM IMMOBILIZER LRG (SOFTGOODS) ×2 IMPLANT
SLING ARM IMMOBILIZER MED (SOFTGOODS) IMPLANT
SPONGE GAUZE 4X4 12PLY (GAUZE/BANDAGES/DRESSINGS) ×2 IMPLANT
SPONGE LAP 4X18 X RAY DECT (DISPOSABLE) ×2 IMPLANT
STRIP CLOSURE SKIN 1/2X4 (GAUZE/BANDAGES/DRESSINGS) ×2 IMPLANT
SUT MNCRL AB 3-0 PS2 18 (SUTURE) ×2 IMPLANT
SUT PDS AB 0 CT 36 (SUTURE) IMPLANT
SUT RETRIEVER GRASP 30 DEG (SUTURE) IMPLANT
SYR 20CC LL (SYRINGE) ×2 IMPLANT
TAPE PAPER 3X10 WHT MICROPORE (GAUZE/BANDAGES/DRESSINGS) ×2 IMPLANT
TOWEL OR 17X24 6PK STRL BLUE (TOWEL DISPOSABLE) ×2 IMPLANT
TOWEL OR 17X26 10 PK STRL BLUE (TOWEL DISPOSABLE) ×2 IMPLANT
WAND SUCTION MAX 4MM 90S (SURGICAL WAND) ×2 IMPLANT
WATER STERILE IRR 1000ML POUR (IV SOLUTION) ×2 IMPLANT

## 2011-10-17 NOTE — Anesthesia Postprocedure Evaluation (Signed)
  Anesthesia Post-op Note  Patient: [name]  Procedure(s) Performed: Procedure(s) (LRB): SHOULDER ARTHROSCOPY WITH SUBACROMIAL DECOMPRESSION AND BICEP TENDON REPAIR (Right)  Patient Location: PACU  Anesthesia Type: GA combined with regional for post-op pain  Level of Consciousness: awake, alert  and oriented  Airway and Oxygen Therapy: Patient Spontanous Breathing  Post-op Pain: mild  Post-op Assessment: Post-op Vital signs reviewed  Post-op Vital Signs: Reviewed  Complications: No apparent anesthesia complications

## 2011-10-17 NOTE — H&P (Signed)
Nicole [name]    Chief Complaint: r shoulder HPI: The patient is a 61 y.o. female with chronic right shoulder pain refractory to conservative Rx.  Past Medical History  Diagnosis Date  . Lynch syndrome 10/17/2010  . NHL (non-Hodgkin's lymphoma) 10/17/2010  . Obesity 10/17/2010  . B12 deficiency 10/17/2010  . Colon cancer   . Renal cell carcinoma 2007  . Pneumonia     hx of  . Diarrhea     Due to colon CA. Takes Lomotil  . Right shoulder pain     Past Surgical History  Procedure Date  . Abdominal hysterectomy   . Hemicolectomy 2001    right  . Hemicolectomy 2002    left  . Kidney resection 2007    left partial  . Subtotal colectomy 2007  . Flexible sigmoidoscopy 08/29/2011    Procedure: FLEXIBLE SIGMOIDOSCOPY;  Surgeon: Malissa Hippo, MD;  Location: [facility];  Service: Endoscopy;  Laterality: N/A;  930  . Appendectomy   . Colonoscopy w/ polypectomy     Family History  Problem Relation Age of Onset  . Cancer Son     Social History:  reports that she has never smoked. She has never used smokeless tobacco. She reports that she drinks about .6 ounces of alcohol per week. She reports that she does not use illicit drugs.  Allergies:  Allergies  Allergen Reactions  . Latex Rash  . Morphine And Related Rash and Other (See Comments)    hallucinations    Medications Prior to Admission  Medication Sig Dispense Refill  . Cyanocobalamin (VITAMIN B-12 IJ) Inject as directed every 30 (thirty) days. [name] recent dose administered 09/12/2011.      . diphenoxylate-atropine (LOMOTIL) 2.5-0.025 MG per tablet Take 1 tablet by mouth 4 (four) times daily as needed. For diarrhea.      Marland Kitchen ibuprofen (ADVIL,MOTRIN) 200 MG tablet Take 400 mg by mouth every 6 (six) hours as needed. For pain.         Physical Exam: right shoulder impingement with exam unchanged from office visit 7/3/1`3  Vitals  Temp:  [98.3 F (36.8 C)] 98.3 F (36.8 C) (08/01 1202) Pulse Rate:  [89] 89  (08/01  1202) Resp:  [20] 20  (08/01 1202) BP: (149-160)/(93-104) 160/93 mmHg (08/01 1239) SpO2:  [99 %] 99 % (08/01 1202)  Assessment/Plan  Impression: r shoulder impingement, ac joint oa, bicep tear.  Plan of Action: Procedure(s): SHOULDER ARTHROSCOPY WITH SUBACROMIAL DECOMPRESSION AND BICEP TENDON REPAIR vs tenotomy  Jackqueline Aquilar M 10/17/2011, 2:02 PM

## 2011-10-17 NOTE — Preoperative (Signed)
Beta Blockers   Reason not to administer Beta Blockers:Not Applicable 

## 2011-10-17 NOTE — Transfer of Care (Signed)
Immediate Anesthesia Transfer of Care Note  Patient: Nicole [name]  Procedure(s) Performed: Procedure(s) (LRB): SHOULDER ARTHROSCOPY WITH SUBACROMIAL DECOMPRESSION AND BICEP TENDON REPAIR (Right)  Patient Location: PACU  Anesthesia Type: General  Level of Consciousness: awake, alert  and patient cooperative  Airway & Oxygen Therapy: Patient Spontanous Breathing and Patient connected to nasal cannula oxygen  Post-op Assessment: Report given to PACU RN, Post -op Vital signs reviewed and stable and Patient moving all extremities  Post vital signs: Reviewed and stable  Complications: No apparent anesthesia complications

## 2011-10-17 NOTE — Anesthesia Preprocedure Evaluation (Signed)
Anesthesia Evaluation  Patient identified by MRN, date of birth, ID band Patient awake    Reviewed: Allergy & Precautions, H&P , NPO status , Patient's Chart, lab work & pertinent test results  Airway Mallampati: I TM Distance: >3 FB Neck ROM: Full    Dental   Pulmonary          Cardiovascular     Neuro/Psych    GI/Hepatic   Endo/Other    Renal/GU      Musculoskeletal   Abdominal   Peds  Hematology   Anesthesia Other Findings   Reproductive/Obstetrics                           Anesthesia Physical Anesthesia Plan  ASA: II  Anesthesia Plan: General   Post-op Pain Management: MAC Combined w/ Regional for Post-op pain   Induction: Intravenous  Airway Management Planned: Oral ETT  Additional Equipment:   Intra-op Plan:   Post-operative Plan: Extubation in OR  Informed Consent: I have reviewed the patients History and Physical, chart, labs and discussed the procedure including the risks, benefits and alternatives for the proposed anesthesia with the patient or authorized representative who has indicated his/her understanding and acceptance.     Plan Discussed with: CRNA and Surgeon  Anesthesia Plan Comments:         Anesthesia Quick Evaluation

## 2011-10-17 NOTE — Op Note (Signed)
10/17/2011  4:08 PM  PATIENT:   Nicole [name]  61 y.o. female  PRE-OPERATIVE DIAGNOSIS:  right shoulder impingment acromial clavicular arthritis and biceps tendon repair   POST-OPERATIVE DIAGNOSIS:  Right shoulder impingement, rotator cuff tear, ac joint oa, labral tear  PROCEDURE:  RSA, labral debridement, SAD,DCR,RCR  SURGEON:  Sander Remedios, Vania Rea M.D.  ASSISTANTS: Shuford pac   ANESTHESIA:   GET + ISB  EBL: min  SPECIMEN:  none  Drains: none   PATIENT DISPOSITION:  PACU - hemodynamically stable.    PLAN OF CARE: Discharge to home after PACU  Dictation# [id number]

## 2011-10-17 NOTE — Anesthesia Procedure Notes (Signed)
Anesthesia Regional Block:  Interscalene brachial plexus block  Pre-Anesthetic Checklist: ,, timeout performed, Correct Patient, Correct Site, Correct Laterality, Correct Procedure, Correct Position, site marked, Risks and benefits discussed,  Surgical consent,  Pre-op evaluation,  At surgeon's request and post-op pain management  Laterality: Right  Prep: chloraprep       Needles:  Injection technique: Single-shot  Needle Type: Echogenic Stimulator Needle     Needle Length: 5cm 5 cm Needle Gauge: 21 G    Additional Needles:  Procedures: ultrasound guided and nerve stimulator Interscalene brachial plexus block  Nerve Stimulator or Paresthesia:  Response: 0.4 mA,   Additional Responses:   Narrative:  Start time: 10/17/2011 1:40 PM End time: 10/17/2011 2:15 PM Injection made incrementally with aspirations every 5 mL. Anesthesiologist: Arta Bruce MD  Additional Notes: Monitors applied. Patient sedated. Sterile prep and drape,hand hygiene and sterile gloves were used. Relevant anatomy identified.Needle position confirmed.Local anesthetic injected incrementally after negative aspiration. Local anesthetic spread visualized around nerve(s). Vascular puncture avoided. No complications. Image printed for medical record.The patient tolerated the procedure well.       Interscalene brachial plexus block

## 2011-10-18 ENCOUNTER — Ambulatory Visit (HOSPITAL_COMMUNITY): Payer: 59 | Admitting: Oncology

## 2011-10-18 NOTE — Op Note (Signed)
NAME:  Nicole [name], Nicole [name]               ACCOUNT NO.:  [id number]  MEDICAL RECORD NO.:  [id number]  LOCATION:  [facility]                         FACILITY:  [facility]  PHYSICIAN:  Vania Rea. Verlaine Embry, M.D.  DATE OF BIRTH:  1950-12-12  DATE OF PROCEDURE:  10/17/2011 DATE OF DISCHARGE:  10/17/2011                              OPERATIVE REPORT   PREOPERATIVE DIAGNOSES: 1. Chronic right shoulder impingement syndrome with possible     associated biceps tendon tear. 2. Right shoulder acromioclavicular joint arthropathy.  POSTOPERATIVE DIAGNOSES: 1. Chronic right shoulder impingement syndrome. 2. Right shoulder rotator cuff tear, right shoulder acromioclavicular     joint arthrosis. 3. Right shoulder labral tear, intraoperative findings of a previous     rupture, long head of the biceps tendon.  PROCEDURES: 1. Right shoulder examination under anesthesia. 2. Right shoulder diagnostic arthroscopy. 3. Debridement of complex and extensive labral tear. 4. Arthroscopic subacromial decompression and bursectomy. 5. Arthroscopic distal clavicle resection. 6. Arthroscopic rotator cuff repair utilizing a double row suture     bridge, repair construct.  SURGEON:  Vania Rea. Haydin Dunn, MD  ASSISTANT:  Lucita Lora. Shuford, PA-C  ANESTHESIA:  General endotracheal as well as an interscalene block.  ESTIMATED BLOOD LOSS:  Minimal.  DRAINS:  None.  HISTORY:  Nicole [name] is a 61 year old female who has had chronic right shoulder pain with impingement syndrome and symptoms that have been refractory to prolonged attempts at conservative management.  Her preoperative MRI scan was suggestive of tendinosis of the rotator cuff, but no obvious discrete full-thickness rotator cuff tear by MRI criteria.  Due to her ongoing pain and functional limitations, she was admitted to the operating room at this time for planned right shoulder arthroscopy as described below.  Preoperatively, I counseled Ms. Buist on treatment options as  well as risks versus benefits thereof.  Possible surgical complications were reviewed including the potential for bleeding, infection, neurovascular injury, persistent pain, loss of motion, anesthetic complication, possible need for additional surgery.  She understands and accepts and agrees with our planned procedure.  PROCEDURE IN DETAIL:  After undergoing routine preoperative evaluation, the patient received prophylactic antibiotics, and an interscalene block was attempted initially in the holding area by the Anesthesia Department due to evidence for lack of response.  A second attempt was made and it was not clear whether a full effect to the block ever was achieved.  The patient remained hemodynamically stable.  The patient was then brought to the operating table, placed supine on the operating table, and underwent smooth induction of general endotracheal anesthesia.  Turned to the left lateral decubitus position with beanbag and appropriate padded protected.  Right shoulder examination under anesthesia revealed full motion, no instability patterns.  Right arm was suspended at 70 degrees of abduction with 10 pounds of traction.  The right shoulder girdle region was sterilely prepped and draped in standard fashion. Time-out was called.  Posterior portal was established in glenohumeral joint, anterior portal was established under direct visualization.  The glenohumeral articular surfaces were in good condition.  There was evidence for previous rupture of the long head of the biceps tendon. Residual stump was debrided with the shaver as was debridement  performed for a degenerative tear of the anterior superior as well as posterior labrum.  No obvious instability patterns were noted.  The rotator cuff was inspected from the articular side and did not see any obvious full- thickness defects.  At this point, fluid and instruments were then removed from the glenohumeral joint.  The arm was  dropped down to 30 degrees abduction with the arthroscope introduced in the subacromial space through the posterior portal.  A direct lateral portal was established in the subacromial space.  Abundant dense bursal tissue and adhesions were encountered and these were all divided and excised with a combination of shaver and Stryker wand.  The wand was then used to remove the periosteum from the undersurface of the anterior-half of the acromion and then a subacromial decompression was performed with a bur creating a type 1 morphology.  Portal was then established directly anterior to the distal clavicle and distal clavicle resection was performed with a bur.  Care was taken to confirm visualization of the entire circumference of the distal clavicle to ensure adequate removal of bone.  We then completed the subacromial/subdeltoid bursectomy.  On inspection of the bursal surface of the rotator cuff, there is an obvious defect involving the distal supraspinatus, which encompassed at least 80-90% of thickness of the tendon, this was a small layer of tissue that remained intact on the articular side.  I went ahead and completed this tear and prepared the tuberosity, gently abraded the bone and then debrided the rotator cuff back to healthy tissue.  The defect was approximately 2 cm in width.  Accessory portal with device was established and through the stab wound of the lateral margin of the acromion, we placed an Arthrex PEEK Corkscrew suture anchor.  Limbs of the suture anchor were then passed through the adjacent margin of the rotator cuff in an horizontal mattress pattern and tied with sliding- locking knot followed by multiple overhand-throws and alternating posts. We then created a "suture bridge" with 2 PushLock suture anchors, which nicely reinforced the repair and compressed the margin of the rotator cuff against the bony bed of the tuberosity.  Suture limbs were clipped. The bursectomy  was completed.  Hemostasis was obtained.  Tracey Shuford, PA-C, was used as an Geophysicist/field seismologist throughout this case, essential for help with positioning of the extremity, management of the arthroscopic equipment, suture management, tissue manipulation, wound closure, and intraoperative decision making as well as facilitating the case.  Wounds were closed with Monocryl and Steri-Strips.  Dry dressing taped about the right shoulder, right arm was placed in sling.  The patient was awakened, extubated, and taken to the recovery room in stable condition.     Vania Rea. Cavan Bearden, M.D.     KMS/MEDQ  D:  10/17/2011  T:  10/18/2011  Job:  [id number]

## 2011-10-18 NOTE — Anesthesia Postprocedure Evaluation (Signed)
Anesthesia Post Note  Patient: Nicole [name]  Procedure(s) Performed: Procedure(s) (LRB): SHOULDER ARTHROSCOPY WITH SUBACROMIAL DECOMPRESSION AND BICEP TENDON REPAIR (Right)  Anesthesia type: general  Patient location: PACU  Post pain: Pain level controlled  Post assessment: Patient's Cardiovascular Status Stable  Last Vitals:  Filed Vitals:   10/17/11 1901  BP:   Pulse: 92  Temp: 36.2 C  Resp: 12    Post vital signs: Reviewed and stable  Level of consciousness: sedated  Complications: No apparent anesthesia complications

## 2011-10-28 ENCOUNTER — Ambulatory Visit (HOSPITAL_COMMUNITY)
Admission: RE | Admit: 2011-10-28 | Discharge: 2011-10-28 | Disposition: A | Discharge status: Home / Self Care | Payer: 59 | Source: Ambulatory Visit | Attending: Orthopedic Surgery | Admitting: Orthopedic Surgery

## 2011-10-28 DIAGNOSIS — M6281 Muscle weakness (generalized): Secondary | ICD-10-CM | POA: Insufficient documentation

## 2011-10-28 DIAGNOSIS — M25619 Stiffness of unspecified shoulder, not elsewhere classified: Secondary | ICD-10-CM | POA: Insufficient documentation

## 2011-10-28 DIAGNOSIS — M25519 Pain in unspecified shoulder: Secondary | ICD-10-CM | POA: Insufficient documentation

## 2011-10-28 DIAGNOSIS — IMO0001 Reserved for inherently not codable concepts without codable children: Secondary | ICD-10-CM | POA: Insufficient documentation

## 2011-10-28 DIAGNOSIS — M75101 Unspecified rotator cuff tear or rupture of right shoulder, not specified as traumatic: Secondary | ICD-10-CM

## 2011-10-28 NOTE — Evaluation (Signed)
Occupational Therapy Evaluation  Patient Details  Name: Nicole [name] MRN: [id number] Date of Birth: 30-Oct-1950  Today's Date: 10/28/2011 Time: 4010-2725 OT Time Calculation (min): 33 min OT Evaluation [phone] 15' Manual Therapy [phone] 18' Visit#: 1  of 24   Re-eval: 11/25/11  Assessment Diagnosis: S/P Right RCR and Arthroscopic Debridement Surgical Date: 10/17/11 Next MD Visit: one month Prior Therapy: N/A  Authorization: no authorization required    Past Medical History:  Past Medical History  Diagnosis Date  . Lynch syndrome 10/17/2010  . NHL (non-Hodgkin's lymphoma) 10/17/2010  . Obesity 10/17/2010  . B12 deficiency 10/17/2010  . Colon cancer   . Renal cell carcinoma 2007  . Pneumonia     hx of  . Diarrhea     Due to colon CA. Takes Lomotil  . Right shoulder pain    Past Surgical History:  Past Surgical History  Procedure Date  . Abdominal hysterectomy   . Hemicolectomy 2001    right  . Hemicolectomy 2002    left  . Kidney resection 2007    left partial  . Subtotal colectomy 2007  . Flexible sigmoidoscopy 08/29/2011    Procedure: FLEXIBLE SIGMOIDOSCOPY;  Surgeon: Malissa Hippo, MD;  Location: [facility];  Service: Endoscopy;  Laterality: N/A;  930  . Appendectomy   . Colonoscopy w/ polypectomy     Subjective S:  I have had problems with my right biceps before.  A few months ago is when my shoulder really started to hurt.   Pertinent History: Ms. Blasco began experiencing severe right shoulder pain a few months ago.  She consulted with Dr. Rennis Chris and had a right shoulder scope completed.  During surgery, a rotator cuff tear was detected and repaired.  She has been referred to occupational therapy for evaluation and treatment, following Dr. Dub Mikes protocol. Limitations: Dr. Dub Mikes rotator cuff protocol:  immediate pendulum and elbow- hand AROM.  PROM for 4 weeks post op (11/14/11).  AAROM through 11/28/11 then progress to AROM Special Tests: UEFI scored  26/80= 32% independence level without pain. Patient Stated Goals: I want to lift my arm without pain. Pain Assessment Currently in Pain?: Yes Pain Score: 0-No pain Pain Location: Shoulder Pain Orientation: Right Pain Radiating Towards: Pain is 0/10 at rest.  Her pain increased to a 3/10 with PROM this date.  Precautions/Restrictions  Precautions Precautions: Shoulder Type of Shoulder Precautions: PROM for 4 weeks post op (11/14/11). 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  Assessment ADL/Vision/Perception ADL ADL Comments: Decreased use of RUE with all activities above waist height.   Dominant Hand: Right Vision - History Baseline Vision: No visual deficits  Cognition/Observation Cognition Orientation Level: Oriented X4 Observation/Other Assessments Observations: Patient presents with her RUE in a sling Other Assessments: UEFI 26/80 32%  Sensation/Coordination/Edema Sensation Light Touch: Appears Intact Coordination Gross Motor Movements are Fluid and Coordinated: Yes Fine Motor Movements are Fluid and Coordinated: Yes Edema Edema: N/A  Additional Assessments RUE AROM (degrees) RUE Overall AROM Comments: AROM not assessed due to recent surgery RUE PROM (degrees) RUE Overall PROM Comments: assessed in supine.  ER/IR with shoulder adducted Right Shoulder  Flexion: 109 Degrees Right Shoulder ABduction: 95 Degrees Right Shoulder Internal Rotation: 45 Degrees Right Shoulder External Rotation: 40 Degrees RUE Strength RUE Overall  Strength Comments: Not assessed due to recent surgery LUE Assessment LUE Assessment: Within Functional Limits Palpation Palpation: Moderate fascial restrictions in right shoulder region.     Exercise/Treatments    Manual Therapy Manual Therapy: Myofascial release Myofascial Release: MFR and manual stretching to right upper arm, shoulder region, scapular region, and upper trap region to decrease pain and restrictions and increase PROM to [facility].  Occupational Therapy Assessment and Plan OT Assessment and Plan Clinical Impression Statement: A:  61 year old female presents with increased pain and fascial restrictions in her right shoulder s/p scope and subsequent rotator cuff repair.  She has decreased ROM and strength, causing decreased use of her RUE as dominant with BADLs, IADLs, work, and leisure activities. Pt will benefit from skilled therapeutic intervention in order to improve on the following deficits: Decreased range of motion;Decreased strength;Increased muscle spasms;Increased fascial restricitons;Pain;Impaired UE functional use Rehab Potential: Excellent OT Frequency: Min 2X/week OT Duration: Other (comment) (12 weeks) OT Treatment/Interventions: Self-care/ADL training;Therapeutic exercise;Therapeutic activities;Modalities;Manual therapy;Patient/family education OT Plan: P:  Skilled OT intervention to increase ROM and strength, while decreasing pain and restrictions in RUE in order to use RUE as dominant with all daily, work, and leisure activities.  Treatment Plan:  Follow Protocol:  PROM in supine, isometric strengthening, bridges.  seated shoulder ext, ret, row.  elbow-wrist AROM.  ball stretches.     Goals Short Term Goals Time to Complete Short Term Goals: 6 weeks Short Term Goal 1: Patient will be educated on a HEP. Short Term Goal 2: Patient will increase right shoulder PROM to [facility] for increased ability to comb her hair without difficulty. Short Term Goal 3: Patient will  increase her right shoulder strength to 3+/5 for increased ability to pick up groceries and put them into the refrigerator. Short Term Goal 4: Patient will decrease her right shoulder restrictions from moderate to minimal for increased mobility needed to use RUE as dominant with her daily tasks. Short Term Goal 5: Patient will decrease pain level in her right shoulder during functional activities from 3/10 to 2/10. Long Term Goals Time to Complete Long Term Goals: 12 weeks Long Term Goal 1: Patient will return to prior level of I with all B/IADLs, work, and leisure activities, using her RUE as dominant. Long Term Goal 2: Patient will increase her right shoulder AROM to WNL for increased ability to reach into overhead cabinets and comb her hair. Long Term Goal 3: Patient will increase her right shoulder strength to 5/5 for increased ability to pick up laundry baskets. Long Term Goal 4: Patient will decrease right shoulder fascial restrictions to trace. Long Term Goal 5: Patient will decrease right shoulder pain to 1/10 or less when combing her hair or reaching overhead.  Problem List Patient Active Problem List  Diagnosis  . Lynch syndrome  . Renal cell carcinoma  . NHL (non-Hodgkin's lymphoma)  . Obesity  . B12 deficiency  . Rotator cuff tear, right  . Pain in joint, shoulder region  . Muscle weakness (generalized)    End of Session Activity Tolerance: Patient tolerated treatment well General Behavior During Session: [facility] for tasks performed Cognition: [facility] for tasks performed OT Plan of Care OT Home Exercise Plan: Educated patient on towel slides and reviewed pendulum exercises for proper technique. Consulted and Agree with Plan of Care: Patient  GO  Shirlean Mylar, OTR/L  10/28/2011, 4:13 PM  Physician Documentation Your signature is required to indicate approval of the treatment plan as stated above.  Please sign and either send electronically or make a copy of this  report for your files and return this physician signed original.  Please mark one 1.__approve of plan  2. ___approve of plan with the following conditions.   ______________________________                                                          _____________________ Physician Signature                                                                                                             Date

## 2011-10-30 ENCOUNTER — Ambulatory Visit (HOSPITAL_COMMUNITY)
Admission: RE | Admit: 2011-10-30 | Discharge: 2011-10-30 | Disposition: A | Discharge status: Home / Self Care | Payer: 59 | Source: Ambulatory Visit | Attending: Orthopedic Surgery | Admitting: Orthopedic Surgery

## 2011-10-30 DIAGNOSIS — M25519 Pain in unspecified shoulder: Secondary | ICD-10-CM

## 2011-10-30 DIAGNOSIS — M75101 Unspecified rotator cuff tear or rupture of right shoulder, not specified as traumatic: Secondary | ICD-10-CM

## 2011-10-30 DIAGNOSIS — M6281 Muscle weakness (generalized): Secondary | ICD-10-CM

## 2011-10-30 NOTE — Progress Notes (Signed)
Occupational Therapy Treatment Patient Details  Name: RIATA IKEDA MRN: [id number] Date of Birth: [birth year]  Today's Date: 10/30/2011 Time: 1600-1640 OT Time Calculation (min): 40 min Manual Therapy [phone] 24' Therapeutic Exercises [phone] 16' Visit#: 2  of 24   Re-eval: 11/25/11     Subjective S:  I did something yesterday, Im not sure what, and my arm is a bit sore. Limitations: Dr. Dub Mikes rotator cuff protocol: immediate pendulum and elbow- hand AROM. PROM for 4 weeks post op (11/14/11). AAROM through 11/28/11 then progress to AROM  Pain Assessment Currently in Pain?: Yes Pain Score:   2 Pain Location: Shoulder Pain Orientation: Right Pain Type: Acute pain Pain Onset: Yesterday  Precautions/Restrictions   Dr. Dub Mikes rotator cuff protocol: immediate pendulum and elbow- hand AROM. PROM for 4 weeks post op (11/14/11). AAROM through 11/28/11 then progress to AROM    Exercise/Treatments Supine Protraction: PROM;10 reps Horizontal ABduction: PROM;10 reps External Rotation: PROM;10 reps Internal Rotation: PROM;10 reps Flexion: PROM;10 reps ABduction: PROM;10 reps Other Supine Exercises: bridges x 10 Seated Elevation: AAROM;10 reps Elevation Limitations: unweighted patients arm onto my arm, provided facilitation at anterior shoulder and at inferior angle of scapula to assist with elevation Extension: AROM;10 reps Row: AROM;10 reps Other Seated Exercises: elbow flexion, extension, supination, pronation, wrist flexion, extension  Therapy Ball Flexion: 10 reps Flexion Limitations: with hand over hand facilitation ABduction: 10 reps ABduction Limitations: with hand over hand facilitation Isometric Strengthening   Flexion: Supine;3X3" Extension: Supine;3X3" External Rotation: Supine;3X3" Internal Rotation: Supine;3X3" ABduction: Supine;3X3" ADduction: Supine;3X3"    Manual Therapy Manual Therapy: Myofascial release Myofascial Release: MFR and manual  stretching to right upper arm, shoulder region, scapular region, and upper trap region to decrease pain and restrictions and increase PROM to [facility] within parameters of protocol.  Scar release performed to each surgical incision.  2440-1027   Occupational Therapy Assessment and Plan OT Assessment and Plan Clinical Impression Statement: A:  PROM of flexion and internal rotation near [facility].  Required hand over hand facilitation with ball stretches and facilitation to elevate shoulder.   OT Plan: P:  add 1# to elbow-wrist AROM.  Complete elevation and ball stretches without facilitation.   Goals Short Term Goals Time to Complete Short Term Goals: 6 weeks Short Term Goal 1: Patient will be educated on a HEP. Short Term Goal 2: Patient will increase right shoulder PROM to [facility] for increased ability to comb her hair without difficulty. Short Term Goal 3: Patient will increase her right shoulder strength to 3+/5 for increased ability to pick up groceries and put them into the refrigerator. Short Term Goal 4: Patient will decrease her right shoulder restrictions from moderate to minimal for increased mobility needed to use RUE as dominant with her daily tasks. Short Term Goal 5: Patient will decrease pain level in her right shoulder during functional activities from 3/10 to 2/10. Long Term Goals Time to Complete Long Term Goals: 12 weeks Long Term Goal 1: Patient will return to prior level of I with all B/IADLs, work, and leisure activities, using her RUE as dominant. Long Term Goal 2: Patient will increase her right shoulder AROM to WNL for increased ability to reach into overhead cabinets and comb her hair. Long Term Goal 3: Patient will increase her right shoulder strength to 5/5 for increased ability to pick up laundry baskets. Long Term Goal 4: Patient will decrease right shoulder fascial restrictions to trace. Long Term Goal 5: Patient will decrease right shoulder pain to 1/10 or  less when combing her  hair or reaching overhead.  Problem List Patient Active Problem List  Diagnosis  . Lynch syndrome  . Renal cell carcinoma  . NHL (non-Hodgkin's lymphoma)  . Obesity  . B12 deficiency  . Rotator cuff tear, right  . Pain in joint, shoulder region  . Muscle weakness (generalized)    End of Session Activity Tolerance: Patient tolerated treatment well General Behavior During Session: [facility] for tasks performed Cognition: [facility] for tasks performed  GO    Shirlean Mylar, OTR/L  10/30/2011, 4:57 PM

## 2011-11-04 ENCOUNTER — Ambulatory Visit (HOSPITAL_COMMUNITY)
Admission: RE | Admit: 2011-11-04 | Discharge: 2011-11-04 | Disposition: A | Discharge status: Home / Self Care | Payer: 59 | Source: Ambulatory Visit | Attending: Family Medicine | Admitting: Family Medicine

## 2011-11-04 DIAGNOSIS — M6281 Muscle weakness (generalized): Secondary | ICD-10-CM

## 2011-11-04 DIAGNOSIS — M25519 Pain in unspecified shoulder: Secondary | ICD-10-CM

## 2011-11-04 NOTE — Progress Notes (Signed)
Occupational Therapy Treatment Patient Details  Name: TORRENCE BRANAGAN MRN: [id number] Date of Birth: 01-25-1951  Today's Date: 11/04/2011 Time: 4098-1191 OT Time Calculation (min): 36 min Manual Therapy [phone] 20' Therapeutic Exercise [phone] 15'  Visit#: 3  of 24   Re-eval: 11/25/11    Subjective Symptoms/Limitations Symptoms: S:  I hurt a little bit today, I may have done too much.  Precautions/Restrictions     Exercise/Treatments Supine Protraction: PROM;10 reps Horizontal ABduction: PROM;10 reps External Rotation: PROM;10 reps Internal Rotation: PROM;10 reps Flexion: PROM;10 reps ABduction: PROM;10 reps Other Supine Exercises: bridges x 15 Seated Elevation: AROM;15 reps Extension: AROM;15 reps Row: AROM;15 reps Other Seated Exercises: elbow flexion, extension, supination, pronation, wrist flexion, extension with 1# Therapy Ball Flexion: 25 reps Flexion Limitations: no assist needed to day ABduction: 25 reps ABduction Limitations: no assist needed today ROM / Strengthening / Isometric Strengthening   Flexion: Supine;3X5" Extension: Supine;3X5" External Rotation: Supine;3X5" Internal Rotation: Supine;3X5" ABduction: Supine;3X5" ADduction: Supine;3X5"       Manual Therapy Manual Therapy: Myofascial release Myofascial Release: MFR and manual stretching to right upper arm, shoulder region, scapular region, and upper trap region to decrease pain and restrictions and increase PROM to [facility] within parameters of protocol. Scar release performed to each surgical incision.  Occupational Therapy Assessment and Plan OT Assessment and Plan Clinical Impression Statement: A:  Added 1# to elbow and wrist exercises.  Facilitation not needed today with seated elevation and ball stretches, patient had good form.  No increased pain with exercises. OT Plan: P:  Increase reps with elbow and wrist ex.  Continue to increase PROM and decrease restrictions for 4 weeks post surgery  per protocol.   Goals Short Term Goals Time to Complete Short Term Goals: 6 weeks Short Term Goal 1: Patient will be educated on a HEP. Short Term Goal 2: Patient will increase right shoulder PROM to [facility] for increased ability to comb her hair without difficulty. Short Term Goal 3: Patient will increase her right shoulder strength to 3+/5 for increased ability to pick up groceries and put them into the refrigerator. Short Term Goal 4: Patient will decrease her right shoulder restrictions from moderate to minimal for increased mobility needed to use RUE as dominant with her daily tasks. Short Term Goal 5: Patient will decrease pain level in her right shoulder during functional activities from 3/10 to 2/10. Long Term Goals Time to Complete Long Term Goals: 12 weeks Long Term Goal 1: Patient will return to prior level of I with all B/IADLs, work, and leisure activities, using her RUE as dominant. Long Term Goal 2: Patient will increase her right shoulder AROM to WNL for increased ability to reach into overhead cabinets and comb her hair. Long Term Goal 3: Patient will increase her right shoulder strength to 5/5 for increased ability to pick up laundry baskets. Long Term Goal 4: Patient will decrease right shoulder fascial restrictions to trace. Long Term Goal 5: Patient will decrease right shoulder pain to 1/10 or less when combing her hair or reaching overhead.  Problem List Patient Active Problem List  Diagnosis  . Lynch syndrome  . Renal cell carcinoma  . NHL (non-Hodgkin's lymphoma)  . Obesity  . B12 deficiency  . Rotator cuff tear, right  . Pain in joint, shoulder region  . Muscle weakness (generalized)    End of Session Activity Tolerance: Patient tolerated treatment well General Behavior During Session: [facility] for tasks performed Cognition: [facility] for tasks performed  GO  Noralee Stain, Sutton Hirsch L 11/04/2011, 4:40 PM

## 2011-11-06 ENCOUNTER — Ambulatory Visit (HOSPITAL_COMMUNITY)
Admission: RE | Admit: 2011-11-06 | Discharge: 2011-11-06 | Disposition: A | Discharge status: Home / Self Care | Payer: 59 | Source: Ambulatory Visit | Attending: Orthopedic Surgery | Admitting: Orthopedic Surgery

## 2011-11-06 DIAGNOSIS — M25519 Pain in unspecified shoulder: Secondary | ICD-10-CM

## 2011-11-06 DIAGNOSIS — M6281 Muscle weakness (generalized): Secondary | ICD-10-CM

## 2011-11-06 NOTE — Progress Notes (Signed)
Occupational Therapy Treatment Patient Details  Name: Nicole [name] MRN: [id number] Date of Birth: [birth year]  Today's Date: 11/06/2011 Time: 1027-2536 OT Time Calculation (min): 41 min Manual Therapy [phone] 21' Therapeutic Exercises [phone] 21'  Visit#: 4  of 24   Re-eval: 11/25/11 Assessment Diagnosis: S/P Right RCR and Arthroscopic Debridement Surgical Date: 10/17/11 Next MD Visit: one month  Authorization: no authorization required   Subjective Symptoms/Limitations Symptoms: S:  I am feeling ok.  I did my exercises, they were fine.  I iced my shoulder to prevent the pain.  Precautions/Restrictions  Precautions Precautions: Shoulder Type of Shoulder Precautions: PROM for 4 weeks post op (11/14/11). AAROM through 11/28/11 then progress to AROM   Exercise/Treatments Supine Protraction: PROM;10 reps Horizontal ABduction: PROM;10 reps External Rotation: PROM;10 reps Internal Rotation: PROM;10 reps Flexion: PROM;10 reps ABduction: PROM;10 reps Other Supine Exercises: bridges x 15 Seated Elevation: AROM;15 reps Extension: AROM;15 reps Row: AROM;15 reps Other Seated Exercises: elbow flexion, extension, supination, pronation, wrist flexion, extension with 1#x 15 Therapy Ball Flexion: 25 reps ABduction: 25 reps ROM / Strengthening / Isometric Strengthening   Flexion: Supine;5X5" Extension: Supine;5X5" External Rotation: Supine;5X5" Internal Rotation: Supine;5X5" ABduction: Supine;5X5" ADduction: Supine;5X5"      Manual Therapy Manual Therapy: Myofascial release Myofascial Release: MFR and manual stretching to right upper arm, shoulder region, scapular region, and upper trap region to decrease pain and restrictions and increase PROM to [facility] within parameters of protocol. Scar release performed to each surgical incision  Occupational Therapy Assessment and Plan OT Assessment and Plan Clinical Impression Statement: A:  One verbal cue needed to depress shoulder  with ball stretches.  Patient has good PROM OT Plan: P:  Continue with PROM x one more week then begin AAROM per protocol.   Goals Short Term Goals Time to Complete Short Term Goals: 6 weeks Short Term Goal 1: Patient will be educated on a HEP. Short Term Goal 2: Patient will increase right shoulder PROM to [facility] for increased ability to comb her hair without difficulty. Short Term Goal 3: Patient will increase her right shoulder strength to 3+/5 for increased ability to pick up groceries and put them into the refrigerator. Short Term Goal 4: Patient will decrease her right shoulder restrictions from moderate to minimal for increased mobility needed to use RUE as dominant with her daily tasks. Short Term Goal 5: Patient will decrease pain level in her right shoulder during functional activities from 3/10 to 2/10. Long Term Goals Time to Complete Long Term Goals: 12 weeks Long Term Goal 1: Patient will return to prior level of I with all B/IADLs, work, and leisure activities, using her RUE as dominant. Long Term Goal 2: Patient will increase her right shoulder AROM to WNL for increased ability to reach into overhead cabinets and comb her hair. Long Term Goal 3: Patient will increase her right shoulder strength to 5/5 for increased ability to pick up laundry baskets. Long Term Goal 4: Patient will decrease right shoulder fascial restrictions to trace. Long Term Goal 5: Patient will decrease right shoulder pain to 1/10 or less when combing her hair or reaching overhead.  Problem List Patient Active Problem List  Diagnosis  . Lynch syndrome  . Renal cell carcinoma  . NHL (non-Hodgkin's lymphoma)  . Obesity  . B12 deficiency  . Rotator cuff tear, right  . Pain in joint, shoulder region  . Muscle weakness (generalized)    End of Session Activity Tolerance: Patient tolerated treatment well General Behavior During Session:  [facility] for tasks performed Cognition: [facility] for tasks performed  GO      Vaughan Browner 11/06/2011, 5:12 PM

## 2011-11-07 ENCOUNTER — Other Ambulatory Visit (HOSPITAL_COMMUNITY): Payer: Self-pay | Admitting: Oncology

## 2011-11-07 ENCOUNTER — Encounter (HOSPITAL_COMMUNITY): Payer: 59 | Attending: Oncology

## 2011-11-07 VITALS — BP 121/67 | HR 78

## 2011-11-07 DIAGNOSIS — E538 Deficiency of other specified B group vitamins: Secondary | ICD-10-CM | POA: Insufficient documentation

## 2011-11-07 MED ORDER — CYANOCOBALAMIN 1000 MCG/ML IJ SOLN
INTRAMUSCULAR | Status: AC
  Filled 2011-11-07: qty 1

## 2011-11-07 MED ORDER — CYANOCOBALAMIN 1000 MCG/ML IJ SOLN
1000.0000 ug | Freq: Once | INTRAMUSCULAR | Status: AC
  Administered 2011-11-07: 1000 ug via INTRAMUSCULAR

## 2011-11-07 NOTE — Progress Notes (Signed)
Nicole [name] presents today for injection per MD orders. B12 1000 administered SQ in left Upper Arm. Administration without incident. Patient tolerated well.

## 2011-11-11 ENCOUNTER — Ambulatory Visit (HOSPITAL_COMMUNITY)
Admission: RE | Admit: 2011-11-11 | Discharge: 2011-11-11 | Disposition: A | Discharge status: Home / Self Care | Payer: 59 | Source: Ambulatory Visit | Attending: Family Medicine | Admitting: Family Medicine

## 2011-11-11 DIAGNOSIS — M25519 Pain in unspecified shoulder: Secondary | ICD-10-CM

## 2011-11-11 DIAGNOSIS — M6281 Muscle weakness (generalized): Secondary | ICD-10-CM

## 2011-11-11 NOTE — Progress Notes (Signed)
Occupational Therapy Treatment Patient Details  Name: Nicole [name] MRN: [id number] Date of Birth: 07-24-1950  Today's Date: 11/11/2011 Time: 1478-2956 OT Time Calculation (min): 44 min Manual Therapy: 213-086 27' Therapeutic Exercises: 433-450 17'  Visit#: 5  of 24   Re-eval: 11/25/11 Assessment Diagnosis: S/P Right RCR and Arthroscopic Debridement Surgical Date: 10/17/11   Subjective Symptoms/Limitations Symptoms: S:  I used some hedge clippers to cut some little limbs, i think that made it sore, I iced it to keep it from hurting. Pain Assessment Currently in Pain?: No/denies Pain Score: 0-No pain  Precautions/Restrictions  Precautions Precautions: Shoulder Type of Shoulder Precautions: PROM for 4 weeks post op (11/14/11). AAROM through 11/28/11 then progress to AROM   Exercise/Treatments Supine Protraction: PROM;10 reps Horizontal ABduction: PROM;10 reps External Rotation: PROM;10 reps Internal Rotation: PROM;10 reps Flexion: PROM;10 reps ABduction: PROM;10 reps Other Supine Exercises: bridges x 15 Seated Elevation: AROM;15 reps Extension: AROM;15 reps Row: AROM;15 reps Other Seated Exercises: elbow flexion, extension, supination, pronation, wrist flexion, extension with 2#x 10  Therapy Ball Flexion: 25 reps ABduction: 25 reps ROM / Strengthening / Isometric Strengthening   Flexion: Supine;5X5" Extension: Supine;5X5" External Rotation: Supine;5X5" Internal Rotation: Supine;5X5" ABduction: Supine;5X5" ADduction: Supine;5X5"      Manual Therapy Manual Therapy: Myofascial release Myofascial Release: MFR and manual stretching to right upper arm, shoulder region, scapular region, and upper trap region to decrease pain and restrictions and increase PROM to [facility] within parameters of protocol. Scar release performed to each surgical incision  Occupational Therapy Assessment and Plan OT Assessment and Plan Clinical Impression Statement: A: Increased weight  with seated AROM. Pt required minimal verbal cues during AROM to keep shoulder depressed.  Rehab Potential: Excellent OT Plan: P:  Start AAROM per protocol after next session   Goals Short Term Goals Time to Complete Short Term Goals: 6 weeks Short Term Goal 1: Patient will be educated on a HEP. Short Term Goal 2: Patient will increase right shoulder PROM to [facility] for increased ability to comb her hair without difficulty. Short Term Goal 3: Patient will increase her right shoulder strength to 3+/5 for increased ability to pick up groceries and put them into the refrigerator. Short Term Goal 4: Patient will decrease her right shoulder restrictions from moderate to minimal for increased mobility needed to use RUE as dominant with her daily tasks. Short Term Goal 5: Patient will decrease pain level in her right shoulder during functional activities from 3/10 to 2/10. Long Term Goals Time to Complete Long Term Goals: 12 weeks Long Term Goal 1: Patient will return to prior level of I with all B/IADLs, work, and leisure activities, using her RUE as dominant. Long Term Goal 2: Patient will increase her right shoulder AROM to WNL for increased ability to reach into overhead cabinets and comb her hair. Long Term Goal 3: Patient will increase her right shoulder strength to 5/5 for increased ability to pick up laundry baskets. Long Term Goal 4: Patient will decrease right shoulder fascial restrictions to trace. Long Term Goal 5: Patient will decrease right shoulder pain to 1/10 or less when combing her hair or reaching overhead.  Problem List Patient Active Problem List  Diagnosis  . Lynch syndrome  . Renal cell carcinoma  . NHL (non-Hodgkin's lymphoma)  . Obesity  . B12 deficiency  . Rotator cuff tear, right  . Pain in joint, shoulder region  . Muscle weakness (generalized)    End of Session Activity Tolerance: Patient tolerated treatment well General  Behavior During Session: [facility] for tasks  performed Cognition: [facility] for tasks performed  GO    Noralee Stain, Elani Delph L 11/11/2011, 5:01 PM

## 2011-11-13 ENCOUNTER — Ambulatory Visit (HOSPITAL_COMMUNITY)
Admission: RE | Admit: 2011-11-13 | Discharge: 2011-11-13 | Disposition: A | Discharge status: Home / Self Care | Payer: 59 | Source: Ambulatory Visit | Attending: Family Medicine | Admitting: Family Medicine

## 2011-11-13 DIAGNOSIS — M25519 Pain in unspecified shoulder: Secondary | ICD-10-CM

## 2011-11-13 DIAGNOSIS — M6281 Muscle weakness (generalized): Secondary | ICD-10-CM

## 2011-11-13 NOTE — Progress Notes (Signed)
Occupational Therapy Treatment Patient Details  Name: Nicole [name] MRN: [id number] Date of Birth: September 11, 1950  Today's Date: 11/13/2011 Time: 7829-5621 OT Time Calculation (min): 42 min Manual Therapy 148-210 22' Therapeutic Exercise 211-230 19'  Visit#: 6  of 24   Re-eval: 11/25/11 Assessment Diagnosis: S/P Right RCR and Arthroscopic Debridement  Subjective Symptoms/Limitations Symptoms: S:  I tried to eat with that arm yesterday, it hurt so much I wanted to yell out. Pain Assessment Currently in Pain?: No/denies Pain Score: 0-No pain  Precautions/Restrictions  Precautions Precautions: Shoulder Type of Shoulder Precautions: PROM for 4 weeks post op (11/14/11). AAROM through 11/28/11 then progress to AROM   Exercise/Treatments Supine Protraction: PROM;10 reps Horizontal ABduction: PROM;10 reps External Rotation: PROM;10 reps Internal Rotation: PROM;10 reps Flexion: PROM;10 reps ABduction: PROM;10 reps Other Supine Exercises: bridges x 15 Seated Elevation: AROM;15 reps Extension: AROM;15 reps Row: AROM;15 reps Other Seated Exercises: elbow flexion, extension, supination, pronation, wrist flexion, extension with 2#x 10 Therapy Ball Flexion: 25 reps ABduction: 25 reps ROM / Strengthening / Isometric Strengthening   Flexion: Supine;5X5" Extension: Supine;5X5" External Rotation: Supine;5X5" Internal Rotation: Supine;5X5" ABduction: Supine;5X5" ADduction: Supine;5X5"        Manual Therapy Manual Therapy: Myofascial release Myofascial Release: MFR and manual stretching to right upper arm, shoulder region, scapular region, and upper trap region to decrease pain and restrictions and increase PROM to [facility] within parameters of protocol. Scar release performed to each surgical incision  Occupational Therapy Assessment and Plan OT Assessment and Plan Clinical Impression Statement: A:  Decreased cues with ex for good form Rehab Potential: Excellent OT Frequency: Min  2X/week OT Plan: P:  Begin AAROM in supine per protocol   Goals Short Term Goals Time to Complete Short Term Goals: 6 weeks Short Term Goal 1: Patient will be educated on a HEP. Short Term Goal 2: Patient will increase right shoulder PROM to [facility] for increased ability to comb her hair without difficulty. Short Term Goal 3: Patient will increase her right shoulder strength to 3+/5 for increased ability to pick up groceries and put them into the refrigerator. Short Term Goal 4: Patient will decrease her right shoulder restrictions from moderate to minimal for increased mobility needed to use RUE as dominant with her daily tasks. Short Term Goal 5: Patient will decrease pain level in her right shoulder during functional activities from 3/10 to 2/10. Long Term Goals Time to Complete Long Term Goals: 12 weeks Long Term Goal 1: Patient will return to prior level of I with all B/IADLs, work, and leisure activities, using her RUE as dominant. Long Term Goal 2: Patient will increase her right shoulder AROM to WNL for increased ability to reach into overhead cabinets and comb her hair. Long Term Goal 3: Patient will increase her right shoulder strength to 5/5 for increased ability to pick up laundry baskets. Long Term Goal 4: Patient will decrease right shoulder fascial restrictions to trace. Long Term Goal 5: Patient will decrease right shoulder pain to 1/10 or less when combing her hair or reaching overhead.  Problem List Patient Active Problem List  Diagnosis  . Lynch syndrome  . Renal cell carcinoma  . NHL (non-Hodgkin's lymphoma)  . Obesity  . B12 deficiency  . Rotator cuff tear, right  . Pain in joint, shoulder region  . Muscle weakness (generalized)    End of Session Activity Tolerance: Patient tolerated treatment well General Behavior During Session: [facility] for tasks performed Cognition: [facility] for tasks performed  GO  Noralee Stain, Jewell Ryans L 11/13/2011, 4:23 PM

## 2011-11-19 ENCOUNTER — Ambulatory Visit (HOSPITAL_COMMUNITY)
Admission: RE | Admit: 2011-11-19 | Discharge: 2011-11-19 | Disposition: A | Discharge status: Home / Self Care | Payer: 59 | Source: Ambulatory Visit | Attending: Family Medicine | Admitting: Family Medicine

## 2011-11-19 DIAGNOSIS — M6281 Muscle weakness (generalized): Secondary | ICD-10-CM | POA: Insufficient documentation

## 2011-11-19 DIAGNOSIS — M25619 Stiffness of unspecified shoulder, not elsewhere classified: Secondary | ICD-10-CM | POA: Insufficient documentation

## 2011-11-19 DIAGNOSIS — IMO0001 Reserved for inherently not codable concepts without codable children: Secondary | ICD-10-CM | POA: Insufficient documentation

## 2011-11-19 DIAGNOSIS — M25519 Pain in unspecified shoulder: Secondary | ICD-10-CM | POA: Insufficient documentation

## 2011-11-19 NOTE — Progress Notes (Signed)
Occupational Therapy Treatment Patient Details  Name: Nicole [name] MRN: [id number] Date of Birth: 06/18/1950  Today's Date: 11/19/2011 Time: 5621-3086 OT Time Calculation (min): 50 min Manual Therapy 578-469 15' Therapeutic Exercises 820-855 35' Visit#: 7  of 24   Re-eval: 11/25/11    Authorization: no authorizaiton  required  Subjective S:  I tried working without my sling, but its painful.   Limitations: AAROM from 11/14/11-11/28/11 then progress to AROM. Pain Assessment Currently in Pain?: No/denies Pain Score: 0-No pain  Precautions/Restrictions   AAROM from 11/14/11-11/28/11 then progress to AROM.   Exercise/Treatments Supine Protraction: PROM;AAROM;10 reps Horizontal ABduction: PROM;AAROM;10 reps External Rotation: PROM;AAROM;10 reps External Rotation Limitations: min tactile cuing to maintain alignment Internal Rotation: PROM;AAROM;10 reps Flexion: PROM;AAROM;10 reps ABduction: PROM;AAROM;10 reps ABduction Limitations: min tactile cuing for proper alignment Other Supine Exercises: dc bridges Seated Elevation: AROM;15 reps Extension: AROM;15 reps Row: AROM;15 reps Other Seated Exercises: dc to HEP Pulleys Flexion: 2 minutes ABduction: 2 minutes Therapy Ball Flexion: 25 reps ABduction: 25 reps ROM / Strengthening / Isometric Strengthening Thumb Tacks: 1' Prot/Ret//Elev/Dep: 1' Flexion: Other (comment) (dc supine isometrics, add tband isometrics next visit)    Manual Therapy Manual Therapy: Myofascial release Myofascial Release: MFR and manual stretching to right upper arm, shoulder region, scapular region, and upper trap region to decrease pain and restrictions and increase PROM to [facility] within parameters of protocol. Scar release performed to each surgical incision  [phone]  Occupational Therapy Assessment and Plan OT Assessment and Plan Clinical Impression Statement: A:  Per protocol, able to begin AAROM.  Patient demonstated full PROM that is  relatively pain free, therefore added AAROM in supine,pulleys, thumbtacks, and prot/ret//elev/dep.   OT Plan: P:  Increase reps in supine AAROM, add tband isometrics, attempt seated AAROM.   Goals Short Term Goals Time to Complete Short Term Goals: 6 weeks Short Term Goal 1: Patient will be educated on a HEP. Short Term Goal 1 Progress: Progressing toward goal Short Term Goal 2: Patient will increase right shoulder PROM to [facility] for increased ability to comb her hair without difficulty. Short Term Goal 2 Progress: Progressing toward goal Short Term Goal 3: Patient will increase her right shoulder strength to 3+/5 for increased ability to pick up groceries and put them into the refrigerator. Short Term Goal 3 Progress: Progressing toward goal Short Term Goal 4: Patient will decrease her right shoulder restrictions from moderate to minimal for increased mobility needed to use RUE as dominant with her daily tasks. Short Term Goal 4 Progress: Progressing toward goal Short Term Goal 5: Patient will decrease pain level in her right shoulder during functional activities from 3/10 to 2/10. Short Term Goal 5 Progress: Progressing toward goal Long Term Goals Time to Complete Long Term Goals: 12 weeks Long Term Goal 1: Patient will return to prior level of I with all B/IADLs, work, and leisure activities, using her RUE as dominant. Long Term Goal 1 Progress: Progressing toward goal Long Term Goal 2: Patient will increase her right shoulder AROM to WNL for increased ability to reach into overhead cabinets and comb her hair. Long Term Goal 2 Progress: Progressing toward goal Long Term Goal 3: Patient will increase her right shoulder strength to 5/5 for increased ability to pick up laundry baskets. Long Term Goal 3 Progress: Progressing toward goal Long Term Goal 4: Patient will decrease right shoulder fascial restrictions to trace. Long Term Goal 4 Progress: Progressing toward goal Long Term Goal 5:  Patient will decrease right shoulder  pain to 1/10 or less when combing her hair or reaching overhead. Long Term Goal 5 Progress: Progressing toward goal  Problem List Patient Active Problem List  Diagnosis  . Lynch syndrome  . Renal cell carcinoma  . NHL (non-Hodgkin's lymphoma)  . Obesity  . B12 deficiency  . Rotator cuff tear, right  . Pain in joint, shoulder region  . Muscle weakness (generalized)    End of Session Activity Tolerance: Patient tolerated treatment well General Behavior During Session: [facility] for tasks performed  GO    Shirlean Mylar, OTR/L  11/19/2011, 9:16 AM

## 2011-11-20 ENCOUNTER — Ambulatory Visit (HOSPITAL_COMMUNITY): Payer: 59 | Admitting: Specialist

## 2011-11-21 ENCOUNTER — Ambulatory Visit (HOSPITAL_COMMUNITY)
Admission: RE | Admit: 2011-11-21 | Discharge: 2011-11-21 | Disposition: A | Discharge status: Home / Self Care | Payer: 59 | Source: Ambulatory Visit | Attending: Family Medicine | Admitting: Family Medicine

## 2011-11-21 DIAGNOSIS — M6281 Muscle weakness (generalized): Secondary | ICD-10-CM

## 2011-11-21 DIAGNOSIS — M25519 Pain in unspecified shoulder: Secondary | ICD-10-CM

## 2011-11-21 NOTE — Progress Notes (Signed)
Occupational Therapy Treatment Patient Details  Name: Nicole [name] MRN: [id number] Date of Birth: 06-07-1950  Today's Date: 11/21/2011 Time: 0865-7846 OT Time Calculation (min): 55 min Manual Therapy 9629-5284 20' Therapeutic Exercises 1540-1610 30' Reassessment 5' Visit#: 8  of 24   Re-eval: 12/19/11    Authorization: no authorizaiton required    Subjective S:  I can do alot more with my arm without alot of pain.   Special Tests: UEFI scored 51/80= 64%.   Pain Assessment Currently in Pain?: No/denies Pain Score: 0-No pain  Precautions/Restrictions   AAROM through 11/28/11.  Exercise/Treatments Supine Protraction: PROM;AAROM;10 reps Horizontal ABduction: PROM;AAROM;10 reps External Rotation: PROM;AAROM;10 reps Internal Rotation: PROM;AAROM;10 reps Flexion: PROM;AAROM;10 reps ABduction: PROM;AAROM;10 reps Seated Elevation: AROM;15 reps Extension: AROM;15 reps Row: AROM;15 reps Pulleys Flexion: 3 minutes ABduction: 3 minutes Therapy Ball Flexion: 25 reps ABduction: 25 reps ROM / Strengthening / Isometric Strengthening Thumb Tacks: 1' Prot/Ret//Elev/Dep: 1' Flexion:  (add tband isometrics next visit)    Manual Therapy Manual Therapy: Myofascial release Myofascial Release: MFR and manual stretching to right upper arm, shoulder region, scapular region, and upper trap region to decrease pain and restrictions and increase PROM to [facility] within parameters of protocol. Scar release performed to each surgical incision [id number]   Occupational Therapy Assessment and Plan OT Assessment and Plan Clinical Impression Statement: A:  Please refer to MD progress note.   OT Plan: P: AAROM in seated and tband isometrics.     Goals Short Term Goals Time to Complete Short Term Goals: 6 weeks Short Term Goal 1: Patient will be educated on a HEP. Short Term Goal 1 Progress: Met Short Term Goal 2: Patient will increase right shoulder PROM to [facility] for increased ability to comb her  hair without difficulty. Short Term Goal 2 Progress: Met Short Term Goal 3: Patient will increase her right shoulder strength to 3+/5 for increased ability to pick up groceries and put them into the refrigerator. Short Term Goal 3 Progress: Progressing toward goal Short Term Goal 4: Patient will decrease her right shoulder restrictions from moderate to minimal for increased mobility needed to use RUE as dominant with her daily tasks. Short Term Goal 4 Progress: Met Short Term Goal 5: Patient will decrease pain level in her right shoulder during functional activities from 3/10 to 2/10. Short Term Goal 5 Progress: Met Long Term Goals Time to Complete Long Term Goals: 12 weeks Long Term Goal 1: Patient will return to prior level of I with all B/IADLs, work, and leisure activities, using her RUE as dominant. Long Term Goal 1 Progress: Progressing toward goal Long Term Goal 2: Patient will increase her right shoulder AROM to WNL for increased ability to reach into overhead cabinets and comb her hair. Long Term Goal 2 Progress: Progressing toward goal Long Term Goal 3: Patient will increase her right shoulder strength to 5/5 for increased ability to pick up laundry baskets. Long Term Goal 3 Progress: Progressing toward goal Long Term Goal 4: Patient will decrease right shoulder fascial restrictions to trace. Long Term Goal 4 Progress: Progressing toward goal Long Term Goal 5: Patient will decrease right shoulder pain to 1/10 or less when combing her hair or reaching overhead. Long Term Goal 5 Progress: Progressing toward goal  Problem List Patient Active Problem List  Diagnosis  . Lynch syndrome  . Renal cell carcinoma  . NHL (non-Hodgkin's lymphoma)  . Obesity  . B12 deficiency  . Rotator cuff tear, right  . Pain in joint, shoulder  region  . Muscle weakness (generalized)    End of Session Activity Tolerance: Patient tolerated treatment well General Behavior During Session: [facility] for  tasks performed Cognition: [facility] for tasks performed OT Plan of Care OT Home Exercise Plan: added dowel rod exercises in supine.  GO    Shirlean Mylar, OTR/L  11/21/2011, 4:14 PM

## 2011-11-25 ENCOUNTER — Ambulatory Visit (HOSPITAL_COMMUNITY)
Admission: RE | Admit: 2011-11-25 | Discharge: 2011-11-25 | Disposition: A | Discharge status: Home / Self Care | Payer: 59 | Source: Ambulatory Visit | Attending: Family Medicine | Admitting: Family Medicine

## 2011-11-25 DIAGNOSIS — M6281 Muscle weakness (generalized): Secondary | ICD-10-CM

## 2011-11-25 DIAGNOSIS — M25519 Pain in unspecified shoulder: Secondary | ICD-10-CM

## 2011-11-25 NOTE — Progress Notes (Signed)
Occupational Therapy Treatment Patient Details  Name: Nicole [name] MRN: [id number] Date of Birth: [birth year]  Today's Date: 11/25/2011 Time: 5621-3086 OT Time Calculation (min): 43 min Manual Therapy [phone] 20' [place] Exerc[place] [phone] 23' Visit#: 9  of 24   Re-eval: 12/19/11    Authorization: no authorizaiton required      Subjective S:  I graduated from the sling! Pain Assessment Currently in Pain?: Yes Pain Score:   2 Pain Location: Shoulder Pain Orientation: Right Pain Type: Acute pain  Precautions/Restrictions   AAROM through 11/28/11.   Exercise/Treatments Supine Protraction: PROM;AAROM;10 reps Horizontal ABduction: PROM;AAROM;10 reps External Rotation: PROM;AAROM;10 reps External Rotation Limitations: min tactile cuing to maintain alignment Internal Rotation: PROM;AAROM;10 reps Flexion: PROM;AAROM;10 reps ABduction: PROM;AAROM;10 reps Seated Elevation: AROM;15 reps Protraction: AAROM;10 reps External Rotation: AAROM;10 reps Internal Rotation: AAROM;10 reps Flexion: AAROM;10 reps Pulleys Flexion: 3 minutes ABduction: 3 minutes Therapy Ball Flexion: 25 reps ABduction: 25 reps ROM / Strengthening / Isometric Strengthening Thumb Tacks: 1' Prot/Ret//Elev/Dep: 1' Flexion: Theraband;3X3" Theraband Level (Flexion): Level 2 (Red) Extension: Theraband;3X3" Theraband Level (Extension): Level 2 (Red) External Rotation: Theraband;3X3" Theraband Level (External Rotation): Level 2 (Red) Internal Rotation: Theraband;3X3" Theraband Level (Internal Rotation): Level 2 (Red)     Manual Therapy Manual Therapy: Myofascial release Myofascial Release: MFR and manual stretching to right upper arm, shoulder region, scapular region, and upper trap region to decrease pain and restrictions and increase PROM to [facility] within parameters of protocol. Scar release performed to each surgical incision  [phone]  Occupational Therapy Assessment and Plan OT Assessment and  Plan Clinical Impression Statement: A:  Added AAROM in seated for flexion, protraction, external rotation, and internal rotation.  Increased to tband isometrics  3 x 3".  Full AAROM in supine and seated for protraction, flexion, ext rot, int rot demonstrated this date. OT Plan: P:  Increase reps of AAROM exercises, add abd and horiz abd in seated AAROM if full AAROM in supine demonstrated.    Goals Short Term Goals Time to Complete Short Term Goals: 6 weeks Short Term Goal 1: Patient will be educated on a HEP. Short Term Goal 2: Patient will increase right shoulder PROM to [facility] for increased ability to comb her hair without difficulty. Short Term Goal 3: Patient will increase her right shoulder strength to 3+/5 for increased ability to pick up groceries and put them into the refrigerator. Short Term Goal 4: Patient will decrease her right shoulder restrictions from moderate to minimal for increased mobility needed to use RUE as dominant with her daily tasks. Short Term Goal 5: Patient will decrease pain level in her right shoulder during functional activities from 3/10 to 2/10. Long Term Goals Time to Complete Long Term Goals: 12 weeks Long Term Goal 1: Patient will return to prior level of I with all B/IADLs, work, and leisure activities, using her RUE as dominant. Long Term Goal 1 Progress: Progressing toward goal Long Term Goal 2: Patient will increase her right shoulder AROM to WNL for increased ability to reach into overhead cabinets and comb her hair. Long Term Goal 2 Progress: Progressing toward goal Long Term Goal 3: Patient will increase her right shoulder strength to 5/5 for increased ability to pick up laundry baskets. Long Term Goal 3 Progress: Progressing toward goal Long Term Goal 4: Patient will decrease right shoulder fascial restrictions to trace. Long Term Goal 4 Progress: Progressing toward goal Long Term Goal 5: Patient will decrease right shoulder pain to 1/10 or less when  combing her  hair or reaching overhead. Long Term Goal 5 Progress: Progressing toward goal  Problem List Patient Active Problem List  Diagnosis  . Lynch syndrome  . Renal cell carcinoma  . NHL (non-Hodgkin's lymphoma)  . Obesity  . B12 deficiency  . Rotator cuff tear, right  . Pain in joint, shoulder region  . Muscle weakness (generalized)    End of Session Activity Tolerance: Patient tolerated treatment well General Behavior During Session: [facility] for tasks performed Cognition: [facility] for tasks performed  GO    Shirlean Mylar, OTR/L  11/25/2011, 8:46 AM

## 2011-11-27 ENCOUNTER — Ambulatory Visit (HOSPITAL_COMMUNITY)
Admission: RE | Admit: 2011-11-27 | Discharge: 2011-11-27 | Disposition: A | Discharge status: Home / Self Care | Payer: 59 | Source: Ambulatory Visit | Attending: Family Medicine | Admitting: Family Medicine

## 2011-11-27 DIAGNOSIS — M25519 Pain in unspecified shoulder: Secondary | ICD-10-CM

## 2011-11-27 DIAGNOSIS — M6281 Muscle weakness (generalized): Secondary | ICD-10-CM

## 2011-11-27 NOTE — Progress Notes (Signed)
Occupational Therapy Treatment Patient Details  Name: NIKIE QADRI MRN: [id number] Date of Birth: [birth year]  Today's Date: 11/27/2011 Time: 4034-7425 OT Time Calculation (min): 52 min Manual Therapy [phone] 20' Therapeutic Exercises [phone] 25' Visit#: 10  of 24   Re-eval: 12/19/11    Authorization: no authorizaiton required    Subjective S:  It really feels pretty good. Limitations: AAROM from 11/14/11-11/28/11 then progress to AROM Pain Assessment Currently in Pain?: No/denies Pain Score: 0-No pain  Precautions/Restrictions    AAROM from 11/14/11-11/28/11 then progress to AROM   Exercise/Treatments Supine Protraction: PROM;10 reps;AAROM;15 reps Horizontal ABduction: PROM;10 reps;AAROM;15 reps External Rotation: PROM;10 reps;AAROM;15 reps Internal Rotation: PROM;10 reps;AAROM;15 reps Flexion: PROM;10 reps;AAROM;15 reps ABduction: PROM;10 reps;AAROM;15 reps Seated Elevation: AROM;15 reps Protraction: AAROM;10 reps Horizontal ABduction: AAROM;10 reps;Limitations Horizontal ABduction Limitations: OT provided facilitation at RUE for proper positioning External Rotation: AAROM;10 reps Internal Rotation: AAROM;10 reps Flexion: AAROM;10 reps Abduction: AAROM;10 reps Pulleys Flexion: 3 minutes ABduction: 3 minutes Therapy Ball Flexion: 25 reps ABduction: 25 reps ROM / Strengthening / Isometric Strengthening Thumb Tacks: 1' Prot/Ret//Elev/Dep: 1' Flexion: Theraband;3X5" Theraband Level (Flexion): Level 2 (Red) Extension: Theraband;3X5" Theraband Level (Extension): Level 2 (Red) External Rotation: Theraband;3X5" Theraband Level (External Rotation): Level 2 (Red) Internal Rotation: Theraband;3X5" Theraband Level (Internal Rotation): Level 2 (Red)    Manual Therapy Manual Therapy: Myofascial release Myofascial Release: MFR and manual stretching to right upper arm, shoulder region, scapular region, and upper trap region to decrease pain and restrictions and  increase PROM to [facility] within parameters of protocol. [phone]  Occupational Therapy Assessment and Plan OT Assessment and Plan Clinical Impression Statement: A:  Added AAROM for abduction and horizontal abduction, required facilitation.  Full PROM this date in supine.  OT Plan: P:  Per protocol, begin AROM exercises in supine, attempt ball circles, add UBE.     Goals Short Term Goals Time to Complete Short Term Goals: 6 weeks Short Term Goal 1: Patient will be educated on a HEP. Short Term Goal 2: Patient will increase right shoulder PROM to [facility] for increased ability to comb her hair without difficulty. Short Term Goal 3: Patient will increase her right shoulder strength to 3+/5 for increased ability to pick up groceries and put them into the refrigerator. Short Term Goal 4: Patient will decrease her right shoulder restrictions from moderate to minimal for increased mobility needed to use RUE as dominant with her daily tasks. Short Term Goal 5: Patient will decrease pain level in her right shoulder during functional activities from 3/10 to 2/10. Long Term Goals Time to Complete Long Term Goals: 12 weeks Long Term Goal 1: Patient will return to prior level of I with all B/IADLs, work, and leisure activities, using her RUE as dominant. Long Term Goal 2: Patient will increase her right shoulder AROM to WNL for increased ability to reach into overhead cabinets and comb her hair. Long Term Goal 3: Patient will increase her right shoulder strength to 5/5 for increased ability to pick up laundry baskets. Long Term Goal 4: Patient will decrease right shoulder fascial restrictions to trace. Long Term Goal 5: Patient will decrease right shoulder pain to 1/10 or less when combing her hair or reaching overhead.  Problem List Patient Active Problem List  Diagnosis  . Lynch syndrome  . Renal cell carcinoma  . NHL (non-Hodgkin's lymphoma)  . Obesity  . B12 deficiency  . Rotator cuff tear, right  .  Pain in joint, shoulder region  . Muscle weakness (generalized)  End of Session Activity Tolerance: Patient tolerated treatment well General Behavior During Session: [facility] for tasks performed Cognition: [facility] for tasks performed  GO    Shirlean Mylar, OTR/L  11/27/2011, 9:07 AM

## 2011-11-28 MED ORDER — MIDAZOLAM HCL 5 MG/5ML IJ SOLN
INTRAMUSCULAR | Status: AC
  Filled 2011-11-28: qty 5

## 2011-12-02 ENCOUNTER — Ambulatory Visit (HOSPITAL_COMMUNITY)
Admission: RE | Admit: 2011-12-02 | Discharge: 2011-12-02 | Disposition: A | Discharge status: Home / Self Care | Payer: 59 | Source: Ambulatory Visit | Attending: Family Medicine | Admitting: Family Medicine

## 2011-12-02 NOTE — Progress Notes (Signed)
Occupational Therapy Treatment Patient Details  Name: Nicole [name] MRN: [id number] Date of Birth: May 03, 1950  Today's Date: 12/02/2011 Time: 5621-3086 OT Time Calculation (min): 39 min Manual Therapy [phone] 15' Therapeutic Exercises 820-844 24' Visit#: 11  of 24   Re-eval: 12/19/11    Authorization: no authorizaiton required    Subjective S:  Its sore, but not throbbing pain.   Pain Assessment Currently in Pain?: No/denies Pain Score: 0-No pain Pain Location: Shoulder Pain Orientation: Right Pain Type: Acute pain  Precautions/Restrictions   Per protocol begin AROM 11/28/11  Exercise/Treatments Supine Protraction: PROM;AROM;10 reps Horizontal ABduction: PROM;AROM;10 reps External Rotation: PROM;AROM;10 reps Internal Rotation: PROM;AROM;10 reps Flexion: PROM;AROM;10 reps ABduction: PROM;AROM;10 reps Seated Elevation: AROM;15 reps Extension: AROM;15 reps Retraction: AROM;15 reps Row: AROM;15 reps Protraction: AAROM;15 reps Horizontal ABduction: AAROM;15 reps External Rotation: AAROM;15 reps Internal Rotation: AAROM;15 reps Flexion: AAROM;15 reps Abduction: AAROM;15 reps Pulleys Flexion:  (dc) ABduction:  (dc) Therapy Ball Flexion: 20 reps ABduction: 20 reps Right/Left: 5 reps ROM / Strengthening / Isometric Strengthening Wall Wash: 1' Thumb Tacks: 1' Prot/Ret//Elev/Dep: 1'      Manual Therapy Manual Therapy: Myofascial release Myofascial Release: MFR and manual stretching to right upper arm, shoulder region, scapular region, and upper trap region to decrease pain and restrictions and increase PROM to [facility] within parameters of protocol. [phone]  Occupational Therapy Assessment and Plan OT Assessment and Plan Clinical Impression Statement: A:  Per protocol patient able to begin AROM.  Patient with full AAROM in supine, therefore added AROM.  Added ball circles, wall wash.   OT Plan: P:  Add AROM in seated, increase time with wall wash.     Goals Short  Term Goals Time to Complete Short Term Goals: 6 weeks Short Term Goal 1: Patient will be educated on a HEP. Short Term Goal 1 Progress: Met Short Term Goal 2: Patient will increase right shoulder PROM to [facility] for increased ability to comb her hair without difficulty. Short Term Goal 2 Progress: Met Short Term Goal 3: Patient will increase her right shoulder strength to 3+/5 for increased ability to pick up groceries and put them into the refrigerator. Short Term Goal 3 Progress: Progressing toward goal Short Term Goal 4: Patient will decrease her right shoulder restrictions from moderate to minimal for increased mobility needed to use RUE as dominant with her daily tasks. Short Term Goal 4 Progress: Met Short Term Goal 5: Patient will decrease pain level in her right shoulder during functional activities from 3/10 to 2/10. Short Term Goal 5 Progress: Progressing toward goal Long Term Goals Time to Complete Long Term Goals: 12 weeks Long Term Goal 1: Patient will return to prior level of I with all B/IADLs, work, and leisure activities, using her RUE as dominant. Long Term Goal 1 Progress: Progressing toward goal Long Term Goal 2: Patient will increase her right shoulder AROM to WNL for increased ability to reach into overhead cabinets and comb her hair. Long Term Goal 2 Progress: Progressing toward goal Long Term Goal 3: Patient will increase her right shoulder strength to 5/5 for increased ability to pick up laundry baskets. Long Term Goal 3 Progress: Progressing toward goal Long Term Goal 4: Patient will decrease right shoulder fascial restrictions to trace. Long Term Goal 4 Progress: Progressing toward goal Long Term Goal 5: Patient will decrease right shoulder pain to 1/10 or less when combing her hair or reaching overhead. Long Term Goal 5 Progress: Progressing toward goal  Problem List Patient Active Problem  List  Diagnosis  . Lynch syndrome  . Renal cell carcinoma  . NHL  (non-Hodgkin's lymphoma)  . Obesity  . B12 deficiency  . Rotator cuff tear, right  . Pain in joint, shoulder region  . Muscle weakness (generalized)    End of Session Activity Tolerance: Patient tolerated treatment well General Behavior During Session: [facility] for tasks performed Cognition: [facility] for tasks performed  GO    Shirlean Mylar, OTR/L  12/02/2011, 8:47 AM

## 2011-12-04 ENCOUNTER — Ambulatory Visit (HOSPITAL_COMMUNITY)
Admission: RE | Admit: 2011-12-04 | Discharge: 2011-12-04 | Disposition: A | Discharge status: Home / Self Care | Payer: 59 | Source: Ambulatory Visit | Attending: Family Medicine | Admitting: Family Medicine

## 2011-12-04 DIAGNOSIS — M25519 Pain in unspecified shoulder: Secondary | ICD-10-CM

## 2011-12-04 DIAGNOSIS — M6281 Muscle weakness (generalized): Secondary | ICD-10-CM

## 2011-12-04 NOTE — Progress Notes (Signed)
Note reviewed by clinical instructor and accurately reflects treatment session.  Khiree Bukhari L. Avilene Marrin, COTA/L  

## 2011-12-04 NOTE — Progress Notes (Signed)
Occupational Therapy Treatment Patient Details  Name: Nicole [name] MRN: [id number] Date of Birth: [birth year]  Today's Date: 12/04/2011 Time: 0865-7846 OT Time Calculation (min): 48 min Manual Therapy: [phone]' Therapeutic Exercises: 339-409 30'  Visit#: 12  of 24   Re-eval: 12/19/11    Authorization: no authorizaiton required  Authorization Time Period:    Authorization Visit#:   of    Subjective Symptoms/Limitations Symptoms: S: I was a little sore after the last time, we did a lot of new things, but it went away pretty quick.  Pain Assessment Pain Score: 0-No pain Pain Location: Shoulder Pain Orientation: Right Pain Type: Acute pain  Precautions/Restrictions   Shoulder Protocol  Exercise/Treatments Supine Protraction: PROM;10 reps;AROM;12 reps Horizontal ABduction: PROM;10 reps;AROM;12 reps External Rotation: PROM;10 reps;AROM;12 reps Internal Rotation: PROM;10 reps;AROM;12 reps Flexion: PROM;10 reps;AROM;12 reps ABduction: PROM;10 reps;AROM;12 reps Seated Elevation: AROM;15 reps Extension: AROM;15 reps Retraction: AROM;15 reps Row: AROM;15 reps Protraction: AROM;15 reps Horizontal ABduction: AROM;15 reps External Rotation: AROM;15 reps Internal Rotation: AROM;15 reps Flexion: AROM;15 reps Abduction: AROM;15 reps    Therapy Ball Flexion: 20 reps ABduction: 20 reps Right/Left: 5 reps ROM / Strengthening / Isometric Strengthening Wall Wash: 2' Thumb Tacks: 1' Prot/Ret//Elev/Dep: 1'        Manual Therapy Manual Therapy: Myofascial release Myofascial Release: MFR and manual stretching to right upper arm, shoulder region, scapular region, and upper trap region to decrease pain and restrictions and increase PROM to [facility] within parameters of protocol.  Occupational Therapy Assessment and Plan OT Assessment and Plan Clinical Impression Statement: A: Increased reps with AROM in supine. Increased time on wall wash. D/C AAROM in seated and completed  seated AROM. Pt required max tacitle input during retraction and protraction during wall exercise.  Rehab Potential: Excellent OT Plan: P: Contine AROM in seated x 15 per protocol. Decrease tactile cues for retraction/ protraction/ elevation wall exercise.    Goals Short Term Goals Time to Complete Short Term Goals: 6 weeks Short Term Goal 1: Patient will be educated on a HEP. Short Term Goal 2: Patient will increase right shoulder PROM to [facility] for increased ability to comb her hair without difficulty. Short Term Goal 3: Patient will increase her right shoulder strength to 3+/5 for increased ability to pick up groceries and put them into the refrigerator. Short Term Goal 4: Patient will decrease her right shoulder restrictions from moderate to minimal for increased mobility needed to use RUE as dominant with her daily tasks. Short Term Goal 5: Patient will decrease pain level in her right shoulder during functional activities from 3/10 to 2/10. Long Term Goals Time to Complete Long Term Goals: 12 weeks Long Term Goal 1: Patient will return to prior level of I with all B/IADLs, work, and leisure activities, using her RUE as dominant. Long Term Goal 2: Patient will increase her right shoulder AROM to WNL for increased ability to reach into overhead cabinets and comb her hair. Long Term Goal 3: Patient will increase her right shoulder strength to 5/5 for increased ability to pick up laundry baskets. Long Term Goal 4: Patient will decrease right shoulder fascial restrictions to trace. Long Term Goal 5: Patient will decrease right shoulder pain to 1/10 or less when combing her hair or reaching overhead.  Problem List Patient Active Problem List  Diagnosis  . Lynch syndrome  . Renal cell carcinoma  . NHL (non-Hodgkin's lymphoma)  . Obesity  . B12 deficiency  . Rotator cuff tear, right  . Pain in joint,  shoulder region  . Muscle weakness (generalized)    End of Session Activity Tolerance:  Patient tolerated treatment well General Behavior During Session: [facility] for tasks performed Cognition: [facility] for tasks performed  GO   Judi Saa, OTAS  12/04/2011, 4:20 PM

## 2011-12-05 ENCOUNTER — Encounter (HOSPITAL_COMMUNITY): Payer: 59 | Attending: Oncology

## 2011-12-05 VITALS — BP 155/89 | HR 99 | Resp 18

## 2011-12-05 DIAGNOSIS — E538 Deficiency of other specified B group vitamins: Secondary | ICD-10-CM | POA: Insufficient documentation

## 2011-12-05 MED ORDER — CYANOCOBALAMIN 1000 MCG/ML IJ SOLN
1000.0000 ug | Freq: Once | INTRAMUSCULAR | Status: AC
  Administered 2011-12-05: 1000 ug via INTRAMUSCULAR

## 2011-12-05 MED ORDER — CYANOCOBALAMIN 1000 MCG/ML IJ SOLN
INTRAMUSCULAR | Status: AC
  Filled 2011-12-05: qty 1

## 2011-12-05 NOTE — Progress Notes (Signed)
Nicole [name] presents today for injection per MD orders. B12 1000mcg administered SQ in left Upper Arm. Administration without incident. Patient tolerated well.  

## 2011-12-09 ENCOUNTER — Ambulatory Visit (HOSPITAL_COMMUNITY): Payer: 59

## 2011-12-09 ENCOUNTER — Ambulatory Visit (HOSPITAL_COMMUNITY)
Admission: RE | Admit: 2011-12-09 | Discharge: 2011-12-09 | Disposition: A | Discharge status: Home / Self Care | Payer: 59 | Source: Ambulatory Visit | Attending: Family Medicine | Admitting: Family Medicine

## 2011-12-09 DIAGNOSIS — M6281 Muscle weakness (generalized): Secondary | ICD-10-CM

## 2011-12-09 DIAGNOSIS — M25519 Pain in unspecified shoulder: Secondary | ICD-10-CM

## 2011-12-09 NOTE — Progress Notes (Signed)
Occupational Therapy Treatment Patient Details  Name: Nicole [name] MRN: [id number] Date of Birth: March 23, 1950  Today's Date: 12/09/2011 Time: 7829-5621 OT Time Calculation (min): 45 min Manual Therapy: 308-657 18' Therapeutic Exercise: 824-851 27'  Visit#: 13  of 24   Re-eval: 12/19/11    Authorization: no authorizaiton required   Authorization Time Period:    Authorization Visit#:   of    Subjective Symptoms/Limitations Symptoms: S: I cleaned all weekend but I didnt have a problem. I do feel a little tight, but its not bad.  Pain Assessment Currently in Pain?: No/denies Pain Score: 0-No pain Pain Location: Shoulder Pain Orientation: Right  Precautions/Restrictions  Precautions Precautions: Shoulder  Exercise/Treatments Supine Protraction: PROM;10 reps;AROM;15 reps Horizontal ABduction: PROM;10 reps;AROM;15 reps External Rotation: PROM;10 reps;AROM;15 reps Internal Rotation: PROM;10 reps;AROM;15 reps Flexion: PROM;10 reps;AROM;15 reps ABduction: PROM;10 reps;AROM;15 reps Seated Elevation: AROM;15 reps Extension: AROM;15 reps Retraction: AROM;15 reps Row: AROM;15 reps Protraction: AROM;15 reps Horizontal ABduction: AROM;15 reps External Rotation: AROM;15 reps Internal Rotation: AROM;15 reps Flexion: AROM;15 reps Abduction: AROM;15 reps    Therapy Ball Flexion: 25 reps ABduction: 25 reps Right/Left: 5 reps ROM / Strengthening / Isometric Strengthening Wall Wash: 2' Thumb Tacks: 2' Prot/Ret//Elev/Dep: 2'       Manual Therapy Manual Therapy: Myofascial release Myofascial Release: MFR and manual stretching to right upper arm, shoulder region, scapular region, and upper trap region to decrease pain and restrictions and increase PROM to [facility] within parameters of protocol  Occupational Therapy Assessment and Plan OT Assessment and Plan Clinical Impression Statement: A: Increased time with thumb tacks and pro/ret/elev/dep. Pt tolerated all AROM in seated  well. Pt required min vg to depress shoulder during wall exercises and continues to require max tactile facilitation during pro/retr on the wall.  Rehab Potential: Excellent OT Plan: P: Cont seated AROM x 15 per protocol. Add corner stretch to increase scapular mobility to help with protraction and retraction.    Goals Short Term Goals Time to Complete Short Term Goals: 6 weeks Short Term Goal 1: Patient will be educated on a HEP. Short Term Goal 2: Patient will increase right shoulder PROM to [facility] for increased ability to comb her hair without difficulty. Short Term Goal 3: Patient will increase her right shoulder strength to 3+/5 for increased ability to pick up groceries and put them into the refrigerator. Short Term Goal 4: Patient will decrease her right shoulder restrictions from moderate to minimal for increased mobility needed to use RUE as dominant with her daily tasks. Short Term Goal 5: Patient will decrease pain level in her right shoulder during functional activities from 3/10 to 2/10. Long Term Goals Time to Complete Long Term Goals: 12 weeks Long Term Goal 1: Patient will return to prior level of I with all B/IADLs, work, and leisure activities, using her RUE as dominant. Long Term Goal 2: Patient will increase her right shoulder AROM to WNL for increased ability to reach into overhead cabinets and comb her hair. Long Term Goal 3: Patient will increase her right shoulder strength to 5/5 for increased ability to pick up laundry baskets. Long Term Goal 4: Patient will decrease right shoulder fascial restrictions to trace. Long Term Goal 5: Patient will decrease right shoulder pain to 1/10 or less when combing her hair or reaching overhead.  Problem List Patient Active Problem List  Diagnosis  . Lynch syndrome  . Renal cell carcinoma  . NHL (non-Hodgkin's lymphoma)  . Obesity  . B12 deficiency  . Rotator cuff  tear, right  . Pain in joint, shoulder region  . Muscle weakness  (generalized)    End of Session Activity Tolerance: Patient tolerated treatment well General Behavior During Session: [facility] for tasks performed Cognition: [facility] for tasks performed  GO   Judi Saa, OTAS  12/09/2011, 9:06 AM

## 2011-12-09 NOTE — Progress Notes (Signed)
Note reviewed by clinical instructor and accurately reflects treatment session.  Nimsi Males H. Jeter Tomey, OTR/L  

## 2011-12-11 ENCOUNTER — Ambulatory Visit (HOSPITAL_COMMUNITY)
Admission: RE | Admit: 2011-12-11 | Discharge: 2011-12-11 | Disposition: A | Discharge status: Home / Self Care | Payer: 59 | Source: Ambulatory Visit | Attending: Family Medicine | Admitting: Family Medicine

## 2011-12-11 ENCOUNTER — Ambulatory Visit (HOSPITAL_COMMUNITY): Payer: 59 | Admitting: Specialist

## 2011-12-11 DIAGNOSIS — M25519 Pain in unspecified shoulder: Secondary | ICD-10-CM

## 2011-12-11 DIAGNOSIS — M6281 Muscle weakness (generalized): Secondary | ICD-10-CM

## 2011-12-11 NOTE — Progress Notes (Signed)
Occupational Therapy Treatment Patient Details  Name: Nicole [name] MRN: [id number] Date of Birth: Oct 24, 1950  Today's Date: 12/11/2011 Time: 1478-2956 OT Time Calculation (min): 51 min Manual Therapy: 105-126 21' Therapeutic Exercise: 126-156 30'  Visit#: 14  of 24   Re-eval: 12/19/11    Authorization: no authorizaiton required  Authorization Time Period:    Authorization Visit#:   of    Subjective Symptoms/Limitations Symptoms: S: Im not in any pain. I did ice it and meant to bring a bag of peas to work so I could ice it some.  Pain Assessment Currently in Pain?: No/denies Pain Score: 0-No pain  Precautions/Restrictions   Shoulder Protocol  Exercise/Treatments Supine Protraction: PROM;10 reps (omitted AROM in error) Horizontal ABduction: PROM;10 reps External Rotation: PROM;10 reps Internal Rotation: PROM;10 reps Flexion: PROM;10 reps ABduction: PROM;10 reps Seated Elevation: AROM;15 reps Extension: AROM;15 reps Retraction: AROM;15 reps Row: AROM;15 reps Protraction: AROM;15 reps Horizontal ABduction: AROM;15 reps External Rotation: AROM;15 reps Internal Rotation: AROM;15 reps Flexion: AROM;15 reps Abduction: AROM;15 reps    Therapy Ball Flexion: 25 reps ABduction: 25 reps Right/Left: 5 reps ROM / Strengthening / Isometric Strengthening Wall Wash: 2' Thumb Tacks: 2' Prot/Ret//Elev/Dep: 2'     Manual Therapy Manual Therapy: Myofascial release Myofascial Release: MFR and manual stretching to right upper arm, shoulder region, scapular region, and upper trap region to decrease pain and restrictions and increase PROM to [facility] within parameters of protocol   Occupational Therapy Assessment and Plan OT Assessment and Plan Clinical Impression Statement: A: Pt showed full ROM in PROM in supine and in seated AROM. Pt required max tactile facilitation during pro/ret on wall exercise. Introduced [employer], political party to pt to help with pro/retr.  Rehab Potential:  Excellent OT Plan: P: Introduce 1# to supine AROM. Add UBE. Add posterior stretch with the corner stretch to increase scapular mobility.    Goals Short Term Goals Time to Complete Short Term Goals: 6 weeks Short Term Goal 1: Patient will be educated on a HEP. Short Term Goal 2: Patient will increase right shoulder PROM to [facility] for increased ability to comb her hair without difficulty. Short Term Goal 3: Patient will increase her right shoulder strength to 3+/5 for increased ability to pick up groceries and put them into the refrigerator. Short Term Goal 4: Patient will decrease her right shoulder restrictions from moderate to minimal for increased mobility needed to use RUE as dominant with her daily tasks. Short Term Goal 5: Patient will decrease pain level in her right shoulder during functional activities from 3/10 to 2/10. Long Term Goals Time to Complete Long Term Goals: 12 weeks Long Term Goal 1: Patient will return to prior level of I with all B/IADLs, work, and leisure activities, using her RUE as dominant. Long Term Goal 2: Patient will increase her right shoulder AROM to WNL for increased ability to reach into overhead cabinets and comb her hair. Long Term Goal 3: Patient will increase her right shoulder strength to 5/5 for increased ability to pick up laundry baskets. Long Term Goal 4: Patient will decrease right shoulder fascial restrictions to trace. Long Term Goal 5: Patient will decrease right shoulder pain to 1/10 or less when combing her hair or reaching overhead.  Problem List Patient Active Problem List  Diagnosis  . Lynch syndrome  . Renal cell carcinoma  . NHL (non-Hodgkin's lymphoma)  . Obesity  . B12 deficiency  . Rotator cuff tear, right  . Pain in joint, shoulder region  . Muscle  weakness (generalized)    End of Session Activity Tolerance: Patient tolerated treatment well General Behavior During Session: [facility] for tasks performed Cognition: [facility] for tasks  performed  GO    Judi Saa 12/11/2011, 2:22 PM

## 2011-12-11 NOTE — Progress Notes (Signed)
Note reviewed by clinical instructor and accurately reflects treatment session.  Geselle Cardosa L. Briannah Lona, COTA/L  

## 2011-12-16 ENCOUNTER — Ambulatory Visit (HOSPITAL_COMMUNITY): Payer: 59 | Admitting: Specialist

## 2011-12-18 ENCOUNTER — Ambulatory Visit (HOSPITAL_COMMUNITY)
Admission: RE | Admit: 2011-12-18 | Discharge: 2011-12-18 | Disposition: A | Discharge status: Home / Self Care | Payer: 59 | Source: Ambulatory Visit | Attending: Orthopedic Surgery | Admitting: Orthopedic Surgery

## 2011-12-18 DIAGNOSIS — M6281 Muscle weakness (generalized): Secondary | ICD-10-CM | POA: Insufficient documentation

## 2011-12-18 DIAGNOSIS — IMO0001 Reserved for inherently not codable concepts without codable children: Secondary | ICD-10-CM | POA: Insufficient documentation

## 2011-12-18 DIAGNOSIS — M25519 Pain in unspecified shoulder: Secondary | ICD-10-CM | POA: Insufficient documentation

## 2011-12-18 DIAGNOSIS — M25619 Stiffness of unspecified shoulder, not elsewhere classified: Secondary | ICD-10-CM | POA: Insufficient documentation

## 2011-12-18 NOTE — Progress Notes (Signed)
Occupational Therapy Treatment Patient Details  Name: LEOTTA WEINGARTEN MRN: [id number] Date of Birth: 1950-09-30  Today's Date: 12/18/2011 Time: 4098-1191 OT Time Calculation (min): 46 min Manual Therapy [phone] 16' Therapeutic Exercises [phone] 30' Visit#: 15  of 24   Re-eval: 01/15/12    Authorization: no authorizaiton required    Subjective S:  Its a bit sore, but I can move it pretty good.  When can I go down to 1 x week therapy. Special Tests: UEFI was 64%, currently 91% Pain Assessment Currently in Pain?: No/denies Pain Score: 0-No pain  Precautions/Restrictions   Progress as tolerated  Exercise/Treatments Supine Protraction: PROM;10 reps;AROM;15 reps Horizontal ABduction: PROM;10 reps;AROM;15 reps External Rotation: PROM;10 reps;AROM;15 reps Internal Rotation: PROM;10 reps;AROM;15 reps Flexion: PROM;10 reps;AROM;15 reps ABduction: PROM;10 reps;AROM;15 reps Seated Extension:  (begin theraband next visit) Protraction: Strengthening;10 reps Protraction Weight (lbs): 1 Horizontal ABduction: Strengthening;10 reps Horizontal ABduction Weight (lbs): 1 External Rotation: AROM;15 reps Internal Rotation: AROM;15 reps Flexion: AROM;15 reps Abduction: AROM;15 reps Therapy Ball Flexion:  (dc all ball stretches into flexion and abd and circles) ROM / Strengthening / Isometric Strengthening UBE (Upper Arm Bike): 2' and 2' 1.0 Wall Wash: 3' Thumb Tacks: dc "W" Arms: 10 X to V Arms: 10 Prot/Ret//Elev/Dep: dc      Manual Therapy Myofascial Release: MFR and manual stretching to right upper arm, shoulder region, scapular region, and upper trap region to decrease pain and restrictions and increase AROM to [facility] within parameters of protocol [phone]  Occupational Therapy Assessment and Plan OT Assessment and Plan Clinical Impression Statement: A:  Please see monthly progress note for details. Attempted strengthening in seated, however patient demonstrating compensatory  movements so reverted back to AROM.   OT Frequency: Min 1X/week OT Duration: 4 weeks OT Plan: P:  Add strengthening to supine exercises.  Add overhead lace.  Add tband for scapular stability exercises.   Goals Short Term Goals Time to Complete Short Term Goals: 6 weeks Short Term Goal 1: Patient will be educated on a HEP. Short Term Goal 1 Progress: Met Short Term Goal 2: Patient will increase right shoulder PROM to [facility] for increased ability to comb her hair without difficulty. Short Term Goal 2 Progress: Met Short Term Goal 3: Patient will increase her right shoulder strength to 3+/5 for increased ability to pick up groceries and put them into the refrigerator. Short Term Goal 3 Progress: Met Short Term Goal 4: Patient will decrease her right shoulder restrictions from moderate to minimal for increased mobility needed to use RUE as dominant with her daily tasks. Short Term Goal 5: Patient will decrease pain level in her right shoulder during functional activities from 3/10 to 2/10. Short Term Goal 5 Progress: Met Long Term Goals Time to Complete Long Term Goals: 12 weeks Long Term Goal 1: Patient will return to prior level of I with all B/IADLs, work, and leisure activities, using her RUE as dominant. Long Term Goal 1 Progress: Progressing toward goal Long Term Goal 2: Patient will increase her right shoulder AROM to WNL for increased ability to reach into overhead cabinets and comb her hair. Long Term Goal 2 Progress: Progressing toward goal Long Term Goal 3: Patient will increase her right shoulder strength to 5/5 for increased ability to pick up laundry baskets. Long Term Goal 3 Progress: Progressing toward goal Long Term Goal 4: Patient will decrease right shoulder fascial restrictions to trace. Long Term Goal 4 Progress: Progressing toward goal Long Term Goal 5: Patient will decrease right  shoulder pain to 1/10 or less when combing her hair or reaching overhead. Long Term Goal 5  Progress: Met  Problem List Patient Active Problem List  Diagnosis  . Lynch syndrome  . Renal cell carcinoma  . NHL (non-Hodgkin's lymphoma)  . Obesity  . B12 deficiency  . Rotator cuff tear, right  . Pain in joint, shoulder region  . Muscle weakness (generalized)    End of Session Activity Tolerance: Patient tolerated treatment well General Behavior During Session: [facility] for tasks performed Cognition: [facility] for tasks performed OT Plan of Care OT Home Exercise Plan: Educated on AROM in seated and discussed transitioning to strengthening when ready.  GO    Shirlean Mylar, OTR/L  12/18/2011, 9:24 AM

## 2011-12-24 ENCOUNTER — Ambulatory Visit (HOSPITAL_COMMUNITY)
Admission: RE | Admit: 2011-12-24 | Discharge: 2011-12-24 | Disposition: A | Discharge status: Home / Self Care | Payer: 59 | Source: Ambulatory Visit | Attending: Orthopedic Surgery | Admitting: Orthopedic Surgery

## 2011-12-24 DIAGNOSIS — M25519 Pain in unspecified shoulder: Secondary | ICD-10-CM

## 2011-12-24 DIAGNOSIS — M6281 Muscle weakness (generalized): Secondary | ICD-10-CM

## 2011-12-24 NOTE — Progress Notes (Signed)
Occupational Therapy Treatment Patient Details  Name: Nicole [name] MRN: [id number] Date of Birth: [birth year]  Today's Date: 12/24/2011 Time: 1914-7829 OT Time Calculation (min): 40 min Manual Therapy 562-130 10' Therapeutic Exercises [phone] 30' Visit#: 16  of 24   Re-eval: 01/15/12    Authorization: no authorizaiton required    Subjective  S:  I went to the MD and he was really pleased with my progress! Limitations: AROM and strengthening as tolerated Pain Assessment Currently in Pain?: No/denies  Precautions/Restrictions   AROM and strengthening as tolerated   Exercise/Treatments Supine Protraction: PROM;5 reps;Strengthening;10 reps Protraction Weight (lbs): 1 Horizontal ABduction: PROM;5 reps;Strengthening;10 reps Horizontal ABduction Weight (lbs): 1 External Rotation: PROM;5 reps;Strengthening;10 reps External Rotation Weight (lbs): 1 Internal Rotation: PROM;5 reps;Strengthening;10 reps Internal Rotation Weight (lbs): 1 Flexion: PROM;5 reps;Strengthening;10 reps Shoulder Flexion Weight (lbs): 1 ABduction: PROM;5 reps;Strengthening;10 reps Shoulder ABduction Weight (lbs): 1 Seated Extension: Theraband;10 reps Theraband Level (Shoulder Extension): Level 3 (Green) Retraction: Theraband;10 reps Theraband Level (Shoulder Retraction): Level 3 (Green) Row: Theraband;10 reps Theraband Level (Shoulder Row): Level 3 (Green) Protraction: Strengthening;10 reps Protraction Weight (lbs): 1 Horizontal ABduction: AROM;10 reps External Rotation: Strengthening;10 reps External Rotation Weight (lbs): 1 Internal Rotation: Strengthening;10 reps Internal Rotation Weight (lbs): 1 Flexion: AROM;10 reps Abduction: AROM;10 reps ABduction Weight (lbs): 1 ROM / Strengthening / Isometric Strengthening UBE (Upper Arm Bike): 3' and 3' wtih 2.0 Wall Wash: 3' wtih 1# Proximal Shoulder Strengthening, Seated: 15 reps       Manual Therapy Manual Therapy: Myofascial  release Myofascial Release: MFR and manual stretching to right upper arm, shoulder region, scapular region, and upper trap region to decrease pain and restrictions and increase AROM to [facility] within parameters of protocol  [phone]  Occupational Therapy Assessment and Plan OT Assessment and Plan Clinical Impression Statement: A:  Began strengthening in supine.  Attempted to complete strengthening in seated, however, Nicole [name] is utilizing compensatory shoulder elevation, so completed AROM only.   OT Plan: P: Attempt strengthening in seated if compensatory scapular elevation is not present. DC manual therapy and focus on strengthening.   Goals Short Term Goals Time to Complete Short Term Goals: 6 weeks Short Term Goal 1: Patient will be educated on a HEP. Short Term Goal 2: Patient will increase right shoulder PROM to [facility] for increased ability to comb her hair without difficulty. Short Term Goal 3: Patient will increase her right shoulder strength to 3+/5 for increased ability to pick up groceries and put them into the refrigerator. Short Term Goal 4: Patient will decrease her right shoulder restrictions from moderate to minimal for increased mobility needed to use RUE as dominant with her daily tasks. Short Term Goal 5: Patient will decrease pain level in her right shoulder during functional activities from 3/10 to 2/10. Long Term Goals Time to Complete Long Term Goals: 12 weeks Long Term Goal 1: Patient will return to prior level of I with all B/IADLs, work, and leisure activities, using her RUE as dominant. Long Term Goal 2: Patient will increase her right shoulder AROM to WNL for increased ability to reach into overhead cabinets and comb her hair. Long Term Goal 3: Patient will increase her right shoulder strength to 5/5 for increased ability to pick up laundry baskets. Long Term Goal 4: Patient will decrease right shoulder fascial restrictions to trace. Long Term Goal 5: Patient will decrease right  shoulder pain to 1/10 or less when combing her hair or reaching overhead.  Problem List Patient Active Problem List  Diagnosis  . Lynch syndrome  . Renal cell carcinoma  . NHL (non-Hodgkin's lymphoma)  . Obesity  . B12 deficiency  . Rotator cuff tear, right  . Pain in joint, shoulder region  . Muscle weakness (generalized)    End of Session Activity Tolerance: Patient tolerated treatment well General Behavior During Session: [facility] for tasks performed Cognition: [facility] for tasks performed  GO    Shirlean Mylar, OTR/L  12/24/2011, 9:53 AM

## 2011-12-29 ENCOUNTER — Encounter (HOSPITAL_COMMUNITY): Payer: Self-pay | Admitting: *Deleted

## 2011-12-29 ENCOUNTER — Emergency Department (HOSPITAL_COMMUNITY)
Admission: EM | Admit: 2011-12-29 | Discharge: 2011-12-29 | Disposition: A | Discharge status: Home / Self Care | Payer: 59 | Source: Home / Self Care | Attending: Family Medicine | Admitting: Family Medicine

## 2011-12-29 DIAGNOSIS — N390 Urinary tract infection, site not specified: Secondary | ICD-10-CM

## 2011-12-29 LAB — POCT URINALYSIS DIP (DEVICE)
Ketones, ur: NEGATIVE mg/dL
Protein, ur: NEGATIVE mg/dL
Specific Gravity, Urine: 1.02 (ref 1.005–1.030)

## 2011-12-29 MED ORDER — CEPHALEXIN 500 MG PO CAPS: 500.0000 mg | ORAL_CAPSULE | Freq: Two times a day (BID) | ORAL | Status: DC

## 2011-12-29 NOTE — ED Notes (Signed)
Reports being treated for pyelonephritis 2 wks ago - completed course of Bactrim and felt better.  Never had any UTI sxs, just body aches, fever, and cloudy urine.  States urine has remained cloudy, today noticed some slight bladder discomfort and urgency.

## 2011-12-29 NOTE — ED Provider Notes (Signed)
Medical screening examination/treatment/procedure(s) were performed by resident physician or non-physician practitioner and as supervising physician I was immediately available for consultation/collaboration.   Barkley Bruns MD.    Linna Hoff, MD 12/29/11 [phone]

## 2011-12-29 NOTE — ED Provider Notes (Signed)
History     CSN: [id number]  Arrival date & time 12/29/11  1705   None     Chief Complaint  Patient presents with  . Urinary Tract Infection    (Consider location/radiation/quality/duration/timing/severity/associated sxs/prior treatment) Patient is a 61 y.o. female presenting with urinary tract infection. The history is provided by the patient.  Urinary Tract Infection  bactrim for uti 2 weeks ago.   Nicole [name] is a 61 y.o. female who complains of pelvic pressure with urinary cloudiness x 1 day.  States 2 weeks ago prescribed bactrim for uti, last dose 4 days ago.  Denies flank pain, fever, chills, or abnormal vaginal discharge or bleeding.   Has history of UTI, not frequent, hx of renal cell ca, left partial nephrectomy.    Past Medical History  Diagnosis Date  . Obesity 10/17/2010  . B12 deficiency 10/17/2010  . Pneumonia     hx of  . Diarrhea     Due to colon CA. Takes Lomotil  . Right shoulder pain   . Lynch syndrome 10/17/2010  . NHL (non-Hodgkin's lymphoma) 10/17/2010  . Colon cancer     x3  . Renal cell carcinoma 2007    Past Surgical History  Procedure Date  . Abdominal hysterectomy   . Hemicolectomy 2001    right  . Hemicolectomy 2002    left  . Kidney resection 2007    left partial  . Subtotal colectomy 2007  . Flexible sigmoidoscopy 08/29/2011    Procedure: FLEXIBLE SIGMOIDOSCOPY;  Surgeon: Malissa Hippo, MD;  Location: [facility];  Service: Endoscopy;  Laterality: N/A;  930  . Appendectomy   . Colonoscopy w/ polypectomy     Family History  Problem Relation Age of Onset  . Cancer Son     History  Substance Use Topics  . Smoking status: Never Smoker   . Smokeless tobacco: Never Used  . Alcohol Use: No    OB History    Grav Para Term Preterm Abortions TAB SAB Ect Mult Living                  Review of Systems  Constitutional: Negative.   Respiratory: Negative.   Cardiovascular: Negative.   Genitourinary: Positive for pelvic pain.  Negative for dysuria, urgency, frequency, hematuria, flank pain, decreased urine volume, vaginal bleeding, vaginal discharge, enuresis and difficulty urinating.    Allergies  Latex and Morphine and related  Home Medications   Current Outpatient Rx  Name Route Sig Dispense Refill  . VITAMIN B-12 IJ Injection Inject as directed every 30 (thirty) days. Most recent dose administered 09/12/2011.    Marland Kitchen DIPHENOXYLATE-ATROPINE 2.5-0.025 MG PO TABS Oral Take 1 tablet by mouth 4 (four) times daily as needed. For diarrhea.    Marland Kitchen NAPROXEN 500 MG PO TABS Oral Take 1 tablet (500 mg total) by mouth 2 (two) times daily with a meal. 60 tablet 1  . CEPHALEXIN 500 MG PO CAPS Oral Take 1 capsule (500 mg total) by mouth 2 (two) times daily. 20 capsule 0  . TEMAZEPAM 30 MG PO CAPS Oral Take 1 capsule (30 mg total) by mouth at bedtime as needed for sleep. 30 capsule 1    BP 171/93  Pulse 71  Temp 99.1 F (37.3 C) (Oral)  Resp 20  SpO2 99%  Physical Exam  Nursing note and vitals reviewed. Constitutional: She is oriented to person, place, and time. Vital signs are normal. She appears well-developed and well-nourished. She is active and  cooperative.  HENT:  Head: Normocephalic.  Eyes: Conjunctivae normal are normal. Pupils are equal, round, and reactive to light. No scleral icterus.  Neck: Trachea normal. Neck supple.  Cardiovascular: Normal rate and regular rhythm.   Pulmonary/Chest: Effort normal and breath sounds normal.  Abdominal: Soft. Normal appearance and bowel sounds are normal. There is no tenderness. There is no rebound and no CVA tenderness.  Neurological: She is alert and oriented to person, place, and time. No cranial nerve deficit or sensory deficit.  Skin: Skin is warm and dry.  Psychiatric: She has a normal mood and affect. Her speech is normal and behavior is normal. Judgment and thought content normal. Cognition and memory are normal.    ED Course  Procedures (including critical care  time)  Labs Reviewed  POCT URINALYSIS DIP (DEVICE) - Abnormal; Notable for the following:    Hgb urine dipstick LARGE (*)     Leukocytes, UA SMALL (*)  Biochemical Testing Only. Please order routine urinalysis from main lab if confirmatory testing is needed.   All other components within normal limits  URINE CULTURE   No results found.   1. UTI (lower urinary tract infection)       MDM  Urine culture sent, await results, begin keflex.  Follow up with primary care physician as needed.  Consider urology consult if symptoms are not improved.          Johnsie Kindred, NP 12/29/11 [phone]

## 2011-12-31 ENCOUNTER — Ambulatory Visit (HOSPITAL_COMMUNITY)
Admission: RE | Admit: 2011-12-31 | Discharge: 2011-12-31 | Disposition: A | Discharge status: Home / Self Care | Payer: 59 | Source: Ambulatory Visit | Attending: Orthopedic Surgery | Admitting: Orthopedic Surgery

## 2011-12-31 DIAGNOSIS — M25519 Pain in unspecified shoulder: Secondary | ICD-10-CM

## 2011-12-31 DIAGNOSIS — M6281 Muscle weakness (generalized): Secondary | ICD-10-CM

## 2011-12-31 LAB — URINE CULTURE
Colony Count: NO GROWTH
Culture: NO GROWTH

## 2011-12-31 NOTE — Progress Notes (Signed)
Occupational Therapy Treatment Patient Details  Name: Nicole [name] MRN: [id number] Date of Birth: [birth year]  Today's Date: 12/31/2011 Time: 7829-5621 OT Time Calculation (min): 44 min Manual Therapy [phone] 19' Therapeutic Exercises [phone] 25' Visit#: 17  of 24   Re-eval: 12/31/11    Authorization: no authorizaiton required    Subjective S:  I am going to make this my last visit Pain Assessment Currently in Pain?: No/denies Pain Score: 0-No pain  Precautions/Restrictions   N/A  Exercise/Treatments Supine Protraction: PROM;5 reps;Strengthening;10 reps Protraction Weight (lbs): 2 Horizontal ABduction: PROM;Strengthening;10 reps Horizontal ABduction Weight (lbs): 2 External Rotation: PROM;Strengthening;10 reps External Rotation Weight (lbs): 2 Internal Rotation: PROM;Strengthening;10 reps Internal Rotation Weight (lbs): 2 Flexion: PROM;Strengthening;10 reps Shoulder Flexion Weight (lbs): 2 ABduction: PROM;Strengthening;10 reps Shoulder ABduction Weight (lbs): 2 Seated Extension: Theraband;15 reps Theraband Level (Shoulder Extension): Level 3 (Green) Retraction: Theraband;15 reps Theraband Level (Shoulder Retraction): Level 3 (Green) Row: Theraband;15 reps Theraband Level (Shoulder Row): Level 3 (Green) Protraction: Strengthening;5 reps Protraction Weight (lbs): 2 Horizontal ABduction: Strengthening;5 reps Horizontal ABduction Weight (lbs): 2 External Rotation: Strengthening;5 reps External Rotation Weight (lbs): 2 Internal Rotation: Strengthening;5 reps Internal Rotation Weight (lbs): 2 Flexion: Strengthening;10 reps Flexion Weight (lbs): 2 Abduction: Strengthening;5 reps ABduction Weight (lbs): 2 UBE (Upper Arm Bike): 3' and 3' wtih 2.0      Manual Therapy Manual Therapy: Myofascial release Myofascial Release: MFR and manual stretching to right upper arm, shoulder region, scapular region, and upper trap region to decrease pain and restrictions and  increase AROM to [facility] within parameters of protocol  [phone]  Occupational Therapy Assessment and Plan OT Assessment and Plan Clinical Impression Statement: A:  Please see dc summary. OT Plan: P:  DC this date with HEP for scapular strengthening and RUE strengthening.     Goals Short Term Goals Time to Complete Short Term Goals: 6 weeks Short Term Goal 1: Patient will be educated on a HEP. Short Term Goal 1 Progress: Met Short Term Goal 2: Patient will increase right shoulder PROM to [facility] for increased ability to comb her hair without difficulty. Short Term Goal 2 Progress: Met Short Term Goal 3: Patient will increase her right shoulder strength to 3+/5 for increased ability to pick up groceries and put them into the refrigerator. Short Term Goal 3 Progress: Met Short Term Goal 4: Patient will decrease her right shoulder restrictions from moderate to minimal for increased mobility needed to use RUE as dominant with her daily tasks. Short Term Goal 4 Progress: Met Short Term Goal 5: Patient will decrease pain level in her right shoulder during functional activities from 3/10 to 2/10. Short Term Goal 5 Progress: Met Long Term Goals Time to Complete Long Term Goals: 12 weeks Long Term Goal 1: Patient will return to prior level of I with all B/IADLs, work, and leisure activities, using her RUE as dominant. Long Term Goal 1 Progress: Met Long Term Goal 2: Patient will increase her right shoulder AROM to WNL for increased ability to reach into overhead cabinets and comb her hair. Long Term Goal 2 Progress: Met Long Term Goal 3: Patient will increase her right shoulder strength to 5/5 for increased ability to pick up laundry baskets. Long Term Goal 3 Progress: Met Long Term Goal 4: Patient will decrease right shoulder fascial restrictions to trace. Long Term Goal 4 Progress: Met Long Term Goal 5: Patient will decrease right shoulder pain to 1/10 or less when combing her hair or reaching  overhead. Long Term Goal 5  Progress: Met  Problem List Patient Active Problem List  Diagnosis  . Lynch syndrome  . Renal cell carcinoma  . NHL (non-Hodgkin's lymphoma)  . Obesity  . B12 deficiency  . Rotator cuff tear, right  . Pain in joint, shoulder region  . Muscle weakness (generalized)    End of Session Activity Tolerance: Patient tolerated treatment well General Behavior During Session: [facility] for tasks performed Cognition: [facility] for tasks performed OT Plan of Care OT Home Exercise Plan: Educated patient on strengthening for shoulder flexion, abduction, external rotation, internal rotation, protraction, and horizontal abduction this date, as well as theraband for scapular strengthening.  Patient demonstrated I with these exercises.   Shirlean Mylar, OTR/L  12/31/2011, 9:01 AM

## 2012-01-02 ENCOUNTER — Encounter (HOSPITAL_COMMUNITY): Payer: 59 | Attending: Oncology

## 2012-01-02 VITALS — BP 147/91 | HR 78

## 2012-01-02 DIAGNOSIS — E538 Deficiency of other specified B group vitamins: Secondary | ICD-10-CM | POA: Insufficient documentation

## 2012-01-02 MED ORDER — CYANOCOBALAMIN 1000 MCG/ML IJ SOLN
1000.0000 ug | Freq: Once | INTRAMUSCULAR | Status: AC
  Administered 2012-01-02: 1000 ug via INTRAMUSCULAR

## 2012-01-02 MED ORDER — CYANOCOBALAMIN 1000 MCG/ML IJ SOLN
INTRAMUSCULAR | Status: AC
  Filled 2012-01-02: qty 1

## 2012-01-02 NOTE — Progress Notes (Signed)
Nicole [name] presents today for injection per MD orders. B12 1000mcg administered SQ in right Upper Arm. Administration without incident. Patient tolerated well.  

## 2012-01-06 ENCOUNTER — Ambulatory Visit (HOSPITAL_COMMUNITY): Payer: 59 | Admitting: Specialist

## 2012-01-14 ENCOUNTER — Ambulatory Visit (HOSPITAL_COMMUNITY): Payer: 59 | Admitting: Specialist

## 2012-01-14 ENCOUNTER — Ambulatory Visit (INDEPENDENT_AMBULATORY_CARE_PROVIDER_SITE_OTHER): Payer: 59 | Admitting: Urology

## 2012-01-14 DIAGNOSIS — N390 Urinary tract infection, site not specified: Secondary | ICD-10-CM

## 2012-01-14 DIAGNOSIS — N200 Calculus of kidney: Secondary | ICD-10-CM

## 2012-01-28 ENCOUNTER — Ambulatory Visit (INDEPENDENT_AMBULATORY_CARE_PROVIDER_SITE_OTHER): Payer: 59 | Admitting: Urology

## 2012-01-28 DIAGNOSIS — N200 Calculus of kidney: Secondary | ICD-10-CM

## 2012-01-30 ENCOUNTER — Encounter (HOSPITAL_COMMUNITY): Payer: 59 | Attending: Oncology

## 2012-01-30 DIAGNOSIS — E538 Deficiency of other specified B group vitamins: Secondary | ICD-10-CM | POA: Insufficient documentation

## 2012-01-30 MED ORDER — CYANOCOBALAMIN 1000 MCG/ML IJ SOLN
INTRAMUSCULAR | Status: AC
  Filled 2012-01-30: qty 1

## 2012-01-30 MED ORDER — CYANOCOBALAMIN 1000 MCG/ML IJ SOLN
1000.0000 ug | Freq: Once | INTRAMUSCULAR | Status: AC
  Administered 2012-01-30: 1000 ug via INTRAMUSCULAR

## 2012-01-30 NOTE — Progress Notes (Signed)
Nicole [name] presents today for injection per MD orders. B12 1000mcg administered IM in right Upper Arm. Administration without incident. Patient tolerated well.  

## 2012-02-03 ENCOUNTER — Other Ambulatory Visit: Payer: Self-pay | Admitting: Urology

## 2012-02-03 ENCOUNTER — Encounter (HOSPITAL_COMMUNITY): Payer: Self-pay | Admitting: *Deleted

## 2012-02-03 ENCOUNTER — Encounter (HOSPITAL_BASED_OUTPATIENT_CLINIC_OR_DEPARTMENT_OTHER): Payer: Self-pay | Admitting: *Deleted

## 2012-02-03 NOTE — Pre-Procedure Instructions (Signed)
Asked to bring blue folder the day of the procedure,insurance card,I.D. driver's license,wear comfortable clothing and have a driver for the day. Asked not to take Advil,Motrin,Ibuprofen,Aleve or any NSAIDS, Aspirin, or Toradol for 72 hours prior to procedure,  No vitamins or herbal medications 7 days prior to procedure. Stopping Naproxen today. Instructed to take laxative per doctor's office instructions and eat a light dinner the evening before procedure.   To arrive at 0645 for lithotripsy procedure.

## 2012-02-03 NOTE — Progress Notes (Signed)
NPO AFTER MN. ARRIVES AT 1045. NEEDS ISTAT.

## 2012-02-05 ENCOUNTER — Encounter (HOSPITAL_COMMUNITY): Payer: Self-pay | Admitting: Pharmacy Technician

## 2012-02-07 ENCOUNTER — Other Ambulatory Visit: Payer: Self-pay | Admitting: Urology

## 2012-02-07 ENCOUNTER — Encounter (HOSPITAL_BASED_OUTPATIENT_CLINIC_OR_DEPARTMENT_OTHER)
Admission: RE | Disposition: A | Discharge status: Home / Self Care | Payer: Self-pay | Source: Ambulatory Visit | Attending: Urology

## 2012-02-07 ENCOUNTER — Encounter (HOSPITAL_BASED_OUTPATIENT_CLINIC_OR_DEPARTMENT_OTHER): Payer: Self-pay | Admitting: Anesthesiology

## 2012-02-07 ENCOUNTER — Encounter (HOSPITAL_BASED_OUTPATIENT_CLINIC_OR_DEPARTMENT_OTHER): Payer: Self-pay

## 2012-02-07 ENCOUNTER — Ambulatory Visit (HOSPITAL_BASED_OUTPATIENT_CLINIC_OR_DEPARTMENT_OTHER)
Admission: RE | Admit: 2012-02-07 | Discharge: 2012-02-07 | Disposition: A | Discharge status: Home / Self Care | Payer: 59 | Source: Ambulatory Visit | Attending: Urology | Admitting: Urology

## 2012-02-07 ENCOUNTER — Ambulatory Visit (HOSPITAL_BASED_OUTPATIENT_CLINIC_OR_DEPARTMENT_OTHER): Payer: 59 | Admitting: Anesthesiology

## 2012-02-07 DIAGNOSIS — Z85038 Personal history of other malignant neoplasm of large intestine: Secondary | ICD-10-CM | POA: Insufficient documentation

## 2012-02-07 DIAGNOSIS — Z87898 Personal history of other specified conditions: Secondary | ICD-10-CM | POA: Insufficient documentation

## 2012-02-07 DIAGNOSIS — N200 Calculus of kidney: Secondary | ICD-10-CM | POA: Insufficient documentation

## 2012-02-07 DIAGNOSIS — Z85528 Personal history of other malignant neoplasm of kidney: Secondary | ICD-10-CM | POA: Insufficient documentation

## 2012-02-07 HISTORY — DX: Noninfective gastroenteritis and colitis, unspecified: K52.9

## 2012-02-07 HISTORY — DX: Personal history of other malignant neoplasm of large intestine: Z85.038

## 2012-02-07 HISTORY — PX: CYSTOSCOPY WITH STENT PLACEMENT: SHX5790

## 2012-02-07 HISTORY — DX: Personal history of other malignant neoplasm of kidney: Z85.528

## 2012-02-07 LAB — POCT I-STAT, CHEM 8
Chloride: 107 mEq/L (ref 96–112)
Glucose, Bld: 94 mg/dL (ref 70–99)
HCT: 39 % (ref 36.0–46.0)
Potassium: 3.7 mEq/L (ref 3.5–5.1)
Sodium: 141 mEq/L (ref 135–145)

## 2012-02-07 SURGERY — CYSTOSCOPY, WITH STENT INSERTION
Anesthesia: General | Site: Ureter | Laterality: Left | Wound class: Clean Contaminated

## 2012-02-07 MED ORDER — LIDOCAINE HCL (CARDIAC) 20 MG/ML IV SOLN
INTRAVENOUS | Status: DC | PRN
  Administered 2012-02-07: 60 mg via INTRAVENOUS

## 2012-02-07 MED ORDER — FENTANYL CITRATE 0.05 MG/ML IJ SOLN
INTRAMUSCULAR | Status: DC | PRN
  Administered 2012-02-07: 25 ug via INTRAVENOUS
  Administered 2012-02-07: 50 ug via INTRAVENOUS
  Administered 2012-02-07: 25 ug via INTRAVENOUS

## 2012-02-07 MED ORDER — SODIUM CHLORIDE 0.9 % IJ SOLN
3.0000 mL | INTRAMUSCULAR | Status: DC | PRN
  Filled 2012-02-07: qty 3

## 2012-02-07 MED ORDER — ACETAMINOPHEN 10 MG/ML IV SOLN
1000.0000 mg | Freq: Four times a day (QID) | INTRAVENOUS | Status: DC
  Filled 2012-02-07: qty 100

## 2012-02-07 MED ORDER — LACTATED RINGERS IV SOLN
INTRAVENOUS | Status: DC
  Administered 2012-02-07 (×2): via INTRAVENOUS
  Filled 2012-02-07: qty 1000

## 2012-02-07 MED ORDER — SODIUM CHLORIDE 0.9 % IJ SOLN
3.0000 mL | Freq: Two times a day (BID) | INTRAMUSCULAR | Status: DC
  Filled 2012-02-07: qty 3

## 2012-02-07 MED ORDER — PROPOFOL 10 MG/ML IV BOLUS
INTRAVENOUS | Status: DC | PRN
  Administered 2012-02-07: 200 mg via INTRAVENOUS

## 2012-02-07 MED ORDER — ACETAMINOPHEN 650 MG RE SUPP
650.0000 mg | RECTAL | Status: DC | PRN
  Filled 2012-02-07: qty 1

## 2012-02-07 MED ORDER — LACTATED RINGERS IV SOLN
INTRAVENOUS | Status: DC
  Filled 2012-02-07: qty 1000

## 2012-02-07 MED ORDER — SODIUM CHLORIDE 0.9 % IR SOLN
Status: DC | PRN
  Administered 2012-02-07: 3000 mL via INTRAVESICAL

## 2012-02-07 MED ORDER — MIDAZOLAM HCL 5 MG/5ML IJ SOLN
INTRAMUSCULAR | Status: DC | PRN
  Administered 2012-02-07: 2 mg via INTRAVENOUS

## 2012-02-07 MED ORDER — CIPROFLOXACIN HCL 250 MG PO TABS: 250.0000 mg | ORAL_TABLET | Freq: Two times a day (BID) | ORAL | Status: DC

## 2012-02-07 MED ORDER — ACETAMINOPHEN 325 MG PO TABS
650.0000 mg | ORAL_TABLET | ORAL | Status: DC | PRN
  Filled 2012-02-07: qty 2

## 2012-02-07 MED ORDER — ONDANSETRON HCL 4 MG/2ML IJ SOLN
INTRAMUSCULAR | Status: DC | PRN
  Administered 2012-02-07: 4 mg via INTRAVENOUS

## 2012-02-07 MED ORDER — PROMETHAZINE HCL 25 MG/ML IJ SOLN
6.2500 mg | INTRAMUSCULAR | Status: DC | PRN
  Filled 2012-02-07: qty 1

## 2012-02-07 MED ORDER — GENTAMICIN SULFATE 40 MG/ML IJ SOLN
180.0000 mg | INTRAMUSCULAR | Status: AC
  Administered 2012-02-07: 180 mg via INTRAVENOUS
  Filled 2012-02-07 (×2): qty 4.5

## 2012-02-07 MED ORDER — FENTANYL CITRATE 0.05 MG/ML IJ SOLN
25.0000 ug | INTRAMUSCULAR | Status: DC | PRN
  Filled 2012-02-07: qty 1

## 2012-02-07 MED ORDER — DEXAMETHASONE SODIUM PHOSPHATE 4 MG/ML IJ SOLN
INTRAMUSCULAR | Status: DC | PRN
  Administered 2012-02-07: 8 mg via INTRAVENOUS

## 2012-02-07 MED ORDER — SODIUM CHLORIDE 0.9 % IV SOLN
250.0000 mL | INTRAVENOUS | Status: DC | PRN
  Filled 2012-02-07: qty 250

## 2012-02-07 MED ORDER — ONDANSETRON HCL 4 MG/2ML IJ SOLN
4.0000 mg | Freq: Four times a day (QID) | INTRAMUSCULAR | Status: DC | PRN
  Filled 2012-02-07: qty 2

## 2012-02-07 MED ORDER — OXYBUTYNIN CHLORIDE 5 MG PO TABS: 5.0000 mg | ORAL_TABLET | Freq: Three times a day (TID) | ORAL | Status: DC

## 2012-02-07 SURGICAL SUPPLY — 22 items
ADAPTER CATH URET PLST 4-6FR (CATHETERS) IMPLANT
ADPR CATH URET STRL DISP 4-6FR (CATHETERS)
BAG DRAIN URO-CYSTO SKYTR STRL (DRAIN) ×2 IMPLANT
CANISTER SUCT LVC 12 LTR MEDI- (MISCELLANEOUS) ×2 IMPLANT
CATH INTERMIT  6FR 70CM (CATHETERS) IMPLANT
CATH URET 5FR 28IN CONE TIP (BALLOONS)
CATH URET 5FR 28IN OPEN ENDED (CATHETERS) IMPLANT
CATH URET 5FR 70CM CONE TIP (BALLOONS) IMPLANT
CLOTH BEACON ORANGE TIMEOUT ST (SAFETY) ×2 IMPLANT
DRAPE CAMERA CLOSED 9X96 (DRAPES) ×2 IMPLANT
GLOVE BIO SURGEON STRL SZ8 (GLOVE) ×2 IMPLANT
GLOVE BIOGEL PI IND STRL 6.5 (GLOVE) ×1 IMPLANT
GLOVE BIOGEL PI INDICATOR 6.5 (GLOVE) ×1
GOWN PREVENTION PLUS LG XLONG (DISPOSABLE) ×2 IMPLANT
GOWN STRL REIN XL XLG (GOWN DISPOSABLE) ×2 IMPLANT
GUIDEWIRE 0.038 PTFE COATED (WIRE) IMPLANT
GUIDEWIRE ANG ZIPWIRE 038X150 (WIRE) IMPLANT
GUIDEWIRE STR DUAL SENSOR (WIRE) IMPLANT
NS IRRIG 500ML POUR BTL (IV SOLUTION) IMPLANT
PACK CYSTOSCOPY (CUSTOM PROCEDURE TRAY) ×2 IMPLANT
STENT CONTOUR 6FRX24X.038 (STENTS) IMPLANT
STENT URET 6FRX24 CONTOUR (STENTS) ×2 IMPLANT

## 2012-02-07 NOTE — Progress Notes (Signed)
Nicole [name] at Dr. Lenoria Chime office called. There are no orders for this patient having ESWL Monday morning

## 2012-02-07 NOTE — Op Note (Signed)
Preoperative diagnosis:  1. Large left upper pole staghorn calculus   Postoperative diagnosis:  1. Same   Procedure:  1. Cystoscopy 2. Left ureteral stent placement (24 cm x 6 French contour without tether)   Surgeon: [name]. Kari Montero  M.D.  Anesthesia: General  Complications: None   EBL: Minimal  Specimens: None  Indication: Nicole [name] is a 61 y.o. patient with a symptomatic, large left upper renal branch calculus, greater than 25 cm. She is not a candidate for percutaneous nephrolithotomy due to the size and location of the stone. She has had a previous partial nephrectomy, and the stone abuts the parenchymal defect of the kidney. We will be proceeding with lithotripsy in the future. After reviewing the management options for treatment, he/she elected to proceed with the above surgical procedure(s). We have discussed the potential benefits and risks of the procedure, side effects of the proposed treatment, the likelihood of the patient achieving the goals of the procedure, and any potential problems that might occur during the procedure or recuperation. Informed consent has been obtained.  Description of procedure:  The patient was taken to the operating room and general anesthesia was induced.  The patient was placed in the dorsal lithotomy position, prepped and draped in the usual sterile fashion, and preoperative antibiotics were administered. A preoperative time-out was performed.   A 0.38 sensor guidewire was then advanced up the left ureter into the renal pelvis under fluoroscopic guidance.  The wire was then backloaded through the cystoscope and a ureteral stent was advance over the wire using Seldinger technique.  The stent was positioned appropriately under fluoroscopic and cystoscopic guidance.  The wire was then removed with an adequate stent curl noted in the renal pelvis as well as in the bladder.  The bladder was then emptied and the procedure ended.  The  patient appeared to tolerate the procedure well and without complications.  The patient was able to be awakened and transferred to the recovery unit in satisfactory condition.    [name]. Retta Diones, MD

## 2012-02-07 NOTE — Interval H&P Note (Signed)
History and Physical Interval Note:  02/07/2012 11:46 AM  Nicole [name]  has presented today for surgery, with the diagnosis of LEFT RENAL STONE  The various methods of treatment have been discussed with the patient and family. After consideration of risks, benefits and other options for treatment, the patient has consented to  Procedure(s) (LRB) with comments: CYSTOSCOPY WITH STENT PLACEMENT (Left) as a surgical intervention .  The patient's history has been reviewed, patient examined, no change in status, stable for surgery.  I have reviewed the patient's chart and labs.  Questions were answered to the patient's satisfaction.     Chelsea Aus

## 2012-02-07 NOTE — Transfer of Care (Signed)
Immediate Anesthesia Transfer of Care Note  Patient: Nicole [name]  Procedure(s) Performed: Procedure(s) (LRB): CYSTOSCOPY WITH STENT PLACEMENT (Left)  Patient Location: PACU  Anesthesia Type: General  Level of Consciousness: awake, oriented, sedated and patient cooperative  Airway & Oxygen Therapy: Patient Spontanous Breathing and Patient connected to face mask oxygen  Post-op Assessment: Report given to PACU RN and Post -op Vital signs reviewed and stable  Post vital signs: Reviewed and stable  Complications: No apparent anesthesia complications

## 2012-02-07 NOTE — Anesthesia Preprocedure Evaluation (Addendum)
Anesthesia Evaluation  Patient identified by MRN, date of birth, ID band Patient awake    Reviewed: Allergy & Precautions, H&P , NPO status , Patient's Chart, lab work & pertinent test results  Airway Mallampati: II TM Distance: >3 FB Neck ROM: Full    Dental  (+) Dental Advisory Given and Teeth Intact   Pulmonary neg pulmonary ROS,  breath sounds clear to auscultation  Pulmonary exam normal       Cardiovascular negative cardio ROS  Rhythm:Regular Rate:Normal     Neuro/Psych  Neuromuscular disease negative psych ROS   GI/Hepatic negative GI ROS, Neg liver ROS, Hx Colon CA   Endo/Other  Morbid obesity  Renal/GU Renal diseaseLeft partial nephrectomy secondary CA  negative genitourinary   Musculoskeletal negative musculoskeletal ROS (+)   Abdominal   Peds negative pediatric ROS (+)  Hematology  (+) Blood dyscrasia, anemia , NonHodgkins Lymphoma   Anesthesia Other Findings Multiple broken and missing teeth.  Reproductive/Obstetrics negative OB ROS                         Anesthesia Physical Anesthesia Plan  ASA: II  Anesthesia Plan: General   Post-op Pain Management:    Induction: Intravenous  Airway Management Planned: LMA  Additional Equipment:   Intra-op Plan:   Post-operative Plan: Extubation in OR  Informed Consent: I have reviewed the patients History and Physical, chart, labs and discussed the procedure including the risks, benefits and alternatives for the proposed anesthesia with the patient or authorized representative who has indicated his/her understanding and acceptance.   Dental advisory given  Plan Discussed with: CRNA  Anesthesia Plan Comments:         Anesthesia Quick Evaluation

## 2012-02-07 NOTE — H&P (Signed)
H&P  Chief Complaint: Kidney stone  History of Present Illness: Nicole [name] is a 61 y.o. year old woman who presents at this time for left J2 stent placement prior to ESL of a branched left renal calculus. She is an old patient of mine, having undergone a left partial nephrectomy in 2007 for a stage T1a renal cell carcinoma. She has been followed regularly by Dr. Mariel Sleet in [place] for this as well as a h/o lymphoma and colon cancer. She came back to see me in late October of this year for recurrent UTIs. Review of her past imaging revealed a large branched left renal stone>25 mm in size. She has no pain or hematuria with this, but with her h/o recurrent UTI's, it was recommended that she have this treated. She is not a candidate for percutaneous treatment, as the stone abuts her partial nephrectomy defect, and access of a nephrostomy sheath in this area would be difficult because of thin parenchyma. She presents now for stent placement prior to her planned staged ESL.  Past Medical History  Diagnosis Date  . B12 deficiency   . Right shoulder pain S/P ROTATOR CUFF REPAIR  . Chronic diarrhea SECONDARY HX COLON CANCER  . Personal history of renal cancer S/P LEFT PARTIAL NEPHRECTOMY 2007--  NO RECURRENCE  . History of colon cancer [facility] SYNDROME ---  COLORECTAL CANCER S/P COLECTOMY X3  LAST ONE 2007    NO RECURRENCE  . Lynch syndrome 10/17/2010    HX OF  . NHL (non-Hodgkin's lymphoma) 10/17/2010    MONITORED BY DR Mariel Sleet    Past Surgical History  Procedure Date  . Hemicolectomy 2001    right  . Hemicolectomy 2002    left  . Kidney resection 2007    left partial  . Subtotal colectomy 2007  . Flexible sigmoidoscopy 08/29/2011    Procedure: FLEXIBLE SIGMOIDOSCOPY;  Surgeon: Malissa Hippo, MD;  [facility];  Service: Endoscopy;  Laterality: N/A;  930  . Colonoscopy w/ polypectomy   . Left flank exploration w/  partial left nephrectomy/ left hilar lymph node bx  04-17-2005  DR Allisson Schindel    RENAL CELL CARCINOMA  . Laparoscopic assisted vaginal hysterectomy 1993    W/ BILATERAL SALPINGO-OOPHORECTOMY  . Right rotator  cuff repair/   bicep repair 10-17-2011  DR SUPPLE  . Appendectomy 1993    W/ LAVH    Home Medications:  No prescriptions prior to admission    Allergies:  Allergies  Allergen Reactions  . Latex Rash  . Morphine And Related Other (See Comments)    hallucinations    Family History  Problem Relation Age of Onset  . Cancer Son     Social History:  reports that she has never smoked. She has never used smokeless tobacco. She reports that she does not drink alcohol or use illicit drugs.  ROS: Genitourinary, constitutional, skin, eye, otolaryngeal, hematologic/lymphatic, cardiovascular, pulmonary, endocrine, musculoskeletal, gastrointestinal, neurological and psychiatric system(s) were reviewed and pertinent findings if present are noted.  Genitourinary: nocturia and hematuria.  Gastrointestinal: flank pain.  Constitutional: fever.  Musculoskeletal: back pain.      Physical Exam:  Vital signs in last 24 hours:   General:  Alert and oriented, No acute distress HEENT: Normocephalic, atraumatic Neck: No JVD or lymphadenopathy Cardiovascular: Regular rate and rhythm Lungs: Clear bilaterally Abdomen: Soft, nontender, nondistended, no abdominal masses Back: No CVA tenderness Extremities: No edema Neurologic: Grossly intact  Laboratory Data:  No results found for this or  any previous visit (from the past 24 hour(s)). No results found for this or any previous visit (from the past 240 hour(s)). Creatinine: No results found for this basename: CREATININE:7 in the last 168 hours  Radiologic Imaging: No results found.  Impression/Assessment:  Large branched left upper pole calculus  Plan:  Cystoscopy, left J2 stent placement  Chelsea Aus 02/07/2012, 6:42 AM  [name]. Gali Spinney MD

## 2012-02-08 NOTE — Anesthesia Postprocedure Evaluation (Signed)
Anesthesia Post Note  Patient: Nicole [name]  Procedure(s) Performed: Procedure(s) (LRB): CYSTOSCOPY WITH STENT PLACEMENT (Left)  Anesthesia type: General  Patient location: PACU  Post pain: Pain level controlled  Post assessment: Post-op Vital signs reviewed  Last Vitals:  Filed Vitals:   02/07/12 1421  BP: 171/90  Pulse: 89  Temp: 35.9 C  Resp: 18    Post vital signs: Reviewed  Level of consciousness: sedated  Complications: No apparent anesthesia complications

## 2012-02-10 ENCOUNTER — Ambulatory Visit (HOSPITAL_COMMUNITY): Payer: 59

## 2012-02-10 ENCOUNTER — Encounter (HOSPITAL_COMMUNITY): Payer: Self-pay | Admitting: *Deleted

## 2012-02-10 ENCOUNTER — Ambulatory Visit (HOSPITAL_COMMUNITY)
Admission: RE | Admit: 2012-02-10 | Discharge: 2012-02-10 | Disposition: A | Discharge status: Home / Self Care | Payer: 59 | Source: Ambulatory Visit | Attending: Urology | Admitting: Urology

## 2012-02-10 ENCOUNTER — Encounter (HOSPITAL_COMMUNITY)
Admission: RE | Disposition: A | Discharge status: Home / Self Care | Payer: Self-pay | Source: Ambulatory Visit | Attending: Urology

## 2012-02-10 DIAGNOSIS — C8589 Other specified types of non-Hodgkin lymphoma, extranodal and solid organ sites: Secondary | ICD-10-CM | POA: Insufficient documentation

## 2012-02-10 DIAGNOSIS — Z85038 Personal history of other malignant neoplasm of large intestine: Secondary | ICD-10-CM | POA: Insufficient documentation

## 2012-02-10 DIAGNOSIS — N200 Calculus of kidney: Secondary | ICD-10-CM | POA: Insufficient documentation

## 2012-02-10 DIAGNOSIS — Z87440 Personal history of urinary (tract) infections: Secondary | ICD-10-CM | POA: Insufficient documentation

## 2012-02-10 SURGERY — LITHOTRIPSY, ESWL
Anesthesia: LOCAL | Laterality: Left

## 2012-02-10 MED ORDER — SODIUM CHLORIDE 0.9 % IJ SOLN: 3.0000 mL | INTRAMUSCULAR | Status: DC | PRN

## 2012-02-10 MED ORDER — SODIUM CHLORIDE 0.9 % IJ SOLN: 3.0000 mL | Freq: Two times a day (BID) | INTRAMUSCULAR | Status: DC

## 2012-02-10 MED ORDER — DEXTROSE-NACL 5-0.45 % IV SOLN
INTRAVENOUS | Status: DC
  Administered 2012-02-10: 08:00:00 via INTRAVENOUS

## 2012-02-10 MED ORDER — SODIUM CHLORIDE 0.9 % IV SOLN: 250.0000 mL | INTRAVENOUS | Status: DC | PRN

## 2012-02-10 MED ORDER — DIPHENHYDRAMINE HCL 25 MG PO CAPS
25.0000 mg | ORAL_CAPSULE | ORAL | Status: AC
  Administered 2012-02-10: 25 mg via ORAL
  Filled 2012-02-10: qty 1

## 2012-02-10 MED ORDER — ACETAMINOPHEN 650 MG RE SUPP
650.0000 mg | RECTAL | Status: DC | PRN
  Filled 2012-02-10: qty 1

## 2012-02-10 MED ORDER — DIAZEPAM 5 MG PO TABS
10.0000 mg | ORAL_TABLET | ORAL | Status: AC
  Administered 2012-02-10: 10 mg via ORAL
  Filled 2012-02-10: qty 2

## 2012-02-10 MED ORDER — ONDANSETRON HCL 4 MG/2ML IJ SOLN: 4.0000 mg | Freq: Four times a day (QID) | INTRAMUSCULAR | Status: DC | PRN

## 2012-02-10 MED ORDER — ACETAMINOPHEN 10 MG/ML IV SOLN: 1000.0000 mg | Freq: Four times a day (QID) | INTRAVENOUS | Status: DC

## 2012-02-10 MED ORDER — ACETAMINOPHEN 325 MG PO TABS: 650.0000 mg | ORAL_TABLET | ORAL | Status: DC | PRN

## 2012-02-10 MED ORDER — OXYCODONE HCL 5 MG PO TABS: 5.0000 mg | ORAL_TABLET | ORAL | Status: DC | PRN

## 2012-02-10 MED ORDER — LEVOFLOXACIN 500 MG PO TABS
500.0000 mg | ORAL_TABLET | ORAL | Status: AC
  Administered 2012-02-10: 500 mg via ORAL
  Filled 2012-02-10: qty 1

## 2012-02-10 NOTE — Op Note (Signed)
See [facility] OP note scanned into chart. 

## 2012-02-10 NOTE — H&P (Signed)
H&P  Chief Complaint: Kidney stone  History of Present Illness: Nicole [name] is a 61 y.o. year old woman who presents at this time for l ESL of a branched left renal calculus. She is an old patient of mine, having undergone a left partial nephrectomy in 2007 for a stage T1a renal cell carcinoma. She has been followed regularly by Dr. Mariel Sleet in [place] for this as well as a h/o lymphoma and colon cancer. She came back to see me in late October of this year for recurrent UTIs. Review of her past imaging revealed a large branched left renal stone>25 mm in size. She has no pain or hematuria with this, but with her h/o recurrent UTI's, it was recommended that she have this treated. She is not a candidate for percutaneous treatment, as the stone abuts her partial nephrectomy defect, and access of a nephrostomy sheath in this area would be difficult because of thin parenchyma. She had stent placement 3 days ago to assist with stone fragment passage.  Past Medical History  Diagnosis Date  . B12 deficiency   . Right shoulder pain S/P ROTATOR CUFF REPAIR  . Chronic diarrhea SECONDARY HX COLON CANCER  . Personal history of renal cancer S/P LEFT PARTIAL NEPHRECTOMY 2007--  NO RECURRENCE  . History of colon cancer [facility] SYNDROME ---  COLORECTAL CANCER S/P COLECTOMY X3  LAST ONE 2007    NO RECURRENCE  . Lynch syndrome 10/17/2010    HX OF  . NHL (non-Hodgkin's lymphoma) 10/17/2010    MONITORED BY DR Mariel Sleet    Past Surgical History  Procedure Date  . Hemicolectomy 2001    right  . Hemicolectomy 2002    left  . Kidney resection 2007    left partial  . Subtotal colectomy 2007  . Flexible sigmoidoscopy 08/29/2011    Procedure: FLEXIBLE SIGMOIDOSCOPY;  Surgeon: Malissa Hippo, MD;  [facility];  Service: Endoscopy;  Laterality: N/A;  930  . Colonoscopy w/ polypectomy   . Left flank exploration w/  partial left nephrectomy/ left hilar lymph node bx 04-17-2005  DR Ercel Normoyle    RENAL  CELL CARCINOMA  . Laparoscopic assisted vaginal hysterectomy 1993    W/ BILATERAL SALPINGO-OOPHORECTOMY  . Right rotator  cuff repair/   bicep repair 10-17-2011  DR SUPPLE  . Appendectomy 1993    W/ LAVH    Home Medications:  No prescriptions prior to admission    Allergies:  Allergies  Allergen Reactions  . Latex Rash  . Morphine And Related Other (See Comments)    hallucinations    Family History  Problem Relation Age of Onset  . Cancer Son     Social History:  reports that she has never smoked. She has never used smokeless tobacco. She reports that she does not drink alcohol or use illicit drugs.  ROS: Genitourinary, constitutional, skin, eye, otolaryngeal, hematologic/lymphatic, cardiovascular, pulmonary, endocrine, musculoskeletal, gastrointestinal, neurological and psychiatric system(s) were reviewed and pertinent findings if present are noted.  Genitourinary: nocturia and hematuria.  Gastrointestinal: flank pain.  Constitutional: fever.  Musculoskeletal: back pain.   Physical Exam:  Vital signs in last 24 hours:   General:  Alert and oriented, No acute distress HEENT: Normocephalic, atraumatic Neck: No JVD or lymphadenopathy Cardiovascular: Regular rate and rhythm Lungs: Clear bilaterally Abdomen: Soft, nontender, nondistended, no abdominal masses Back: No CVA tenderness Extremities: No edema Neurologic: Grossly intact  Laboratory Data:  No results found for this or any previous visit (from the past 24  hour(s)). No results found for this or any previous visit (from the past 240 hour(s)). Creatinine:  Basename 02/07/12 1134  CREATININE 0.80    Radiologic Imaging: No results found.  Impression/Assessment:  Large branched left upper pole renal calculus  Plan:  Part 1 of staged ESL  Chelsea Aus 02/10/2012, 6:13 AM  [name]. Giann Obara MD

## 2012-02-24 ENCOUNTER — Other Ambulatory Visit: Payer: Self-pay | Admitting: Urology

## 2012-02-25 ENCOUNTER — Encounter (HOSPITAL_COMMUNITY): Payer: Self-pay | Admitting: *Deleted

## 2012-02-25 NOTE — Pre-Procedure Instructions (Signed)
Asked to bring blue folder the day of the procedure,insurance card,I.D. driver's license,wear comfortable clothing and have a driver for the day. Asked not to take Advil,Motrin,Ibuprofen,Aleve or any NSAIDS, Aspirin, or Toradol for 72 hours prior to procedure,  No vitamins or herbal medications 7 days prior to procedure. Plans to  Stop Naproxen per guidelines. Instructed to take laxative per doctor's office instructions and eat a light dinner the evening before procedure.   To arrive at 0530 for lithotripsy procedure.

## 2012-02-27 ENCOUNTER — Encounter (HOSPITAL_COMMUNITY): Payer: 59 | Attending: Oncology

## 2012-02-27 VITALS — BP 140/82 | HR 104

## 2012-02-27 DIAGNOSIS — E538 Deficiency of other specified B group vitamins: Secondary | ICD-10-CM | POA: Insufficient documentation

## 2012-02-27 MED ORDER — CYANOCOBALAMIN 1000 MCG/ML IJ SOLN
INTRAMUSCULAR | Status: AC
  Filled 2012-02-27: qty 1

## 2012-02-27 MED ORDER — CYANOCOBALAMIN 1000 MCG/ML IJ SOLN
1000.0000 ug | Freq: Once | INTRAMUSCULAR | Status: AC
  Administered 2012-02-27: 1000 ug via INTRAMUSCULAR

## 2012-02-27 NOTE — Progress Notes (Signed)
Nicole [name] presents today for injection per MD orders. B12 1000mcg administered SQ in left Upper Arm. Administration without incident. Patient tolerated well.  

## 2012-03-02 ENCOUNTER — Encounter (HOSPITAL_COMMUNITY): Payer: Self-pay | Admitting: Pharmacy Technician

## 2012-03-09 ENCOUNTER — Ambulatory Visit (HOSPITAL_COMMUNITY)
Admission: RE | Admit: 2012-03-09 | Discharge: 2012-03-09 | Disposition: A | Discharge status: Home / Self Care | Payer: 59 | Source: Ambulatory Visit | Attending: Urology | Admitting: Urology

## 2012-03-09 ENCOUNTER — Encounter (HOSPITAL_COMMUNITY): Payer: Self-pay | Admitting: *Deleted

## 2012-03-09 ENCOUNTER — Encounter (HOSPITAL_COMMUNITY)
Admission: RE | Disposition: A | Discharge status: Home / Self Care | Payer: Self-pay | Source: Ambulatory Visit | Attending: Urology

## 2012-03-09 ENCOUNTER — Ambulatory Visit (HOSPITAL_COMMUNITY): Payer: 59

## 2012-03-09 DIAGNOSIS — Z85038 Personal history of other malignant neoplasm of large intestine: Secondary | ICD-10-CM | POA: Insufficient documentation

## 2012-03-09 DIAGNOSIS — C8589 Other specified types of non-Hodgkin lymphoma, extranodal and solid organ sites: Secondary | ICD-10-CM | POA: Insufficient documentation

## 2012-03-09 DIAGNOSIS — N200 Calculus of kidney: Secondary | ICD-10-CM | POA: Insufficient documentation

## 2012-03-09 DIAGNOSIS — Z85528 Personal history of other malignant neoplasm of kidney: Secondary | ICD-10-CM | POA: Insufficient documentation

## 2012-03-09 DIAGNOSIS — Z79899 Other long term (current) drug therapy: Secondary | ICD-10-CM | POA: Insufficient documentation

## 2012-03-09 HISTORY — DX: Unspecified osteoarthritis, unspecified site: M19.90

## 2012-03-09 SURGERY — LITHOTRIPSY, ESWL
Anesthesia: LOCAL | Laterality: Left

## 2012-03-09 MED ORDER — DIPHENHYDRAMINE HCL 25 MG PO CAPS
25.0000 mg | ORAL_CAPSULE | ORAL | Status: AC
  Administered 2012-03-09: 25 mg via ORAL
  Filled 2012-03-09: qty 1

## 2012-03-09 MED ORDER — DEXTROSE-NACL 5-0.45 % IV SOLN
INTRAVENOUS | Status: DC
  Administered 2012-03-09: 07:00:00 via INTRAVENOUS

## 2012-03-09 MED ORDER — DIAZEPAM 5 MG PO TABS
10.0000 mg | ORAL_TABLET | ORAL | Status: AC
  Administered 2012-03-09: 10 mg via ORAL
  Filled 2012-03-09: qty 2

## 2012-03-09 NOTE — H&P (Signed)
H&P  Chief Complaint: Kidney stone  History of Present Illness: Nicole [name] is a 61 y.o. year old female who presents for the 2nd stage lithitripsy of a branched left renal stone. She underwent stenting of her left kidney on 02/07/2012 followed by a first stage ESL of her >25 cm left upper pole staghorn stone. Followup in the office after her 1st rx revealed fragmentation of the stone but some residual stone burden remaining.  Past Medical History  Diagnosis Date  . B12 deficiency   . Right shoulder pain S/P ROTATOR CUFF REPAIR  . Chronic diarrhea SECONDARY HX COLON CANCER  . Personal history of renal cancer S/P LEFT PARTIAL NEPHRECTOMY 2007--  NO RECURRENCE  . History of colon cancer [facility] SYNDROME ---  COLORECTAL CANCER S/P COLECTOMY X3  LAST ONE 2007    NO RECURRENCE  . Lynch syndrome 10/17/2010    HX OF  . NHL (non-Hodgkin's lymphoma) 10/17/2010    MONITORED BY DR Mariel Sleet  . Arthritis     shorulder    Past Surgical History  Procedure Date  . Hemicolectomy 2001    right  . Hemicolectomy 2002    left  . Kidney resection 2007    left partial  . Subtotal colectomy 2007  . Flexible sigmoidoscopy 08/29/2011    Procedure: FLEXIBLE SIGMOIDOSCOPY;  Surgeon: Malissa Hippo, MD;  [facility];  Service: Endoscopy;  Laterality: N/A;  930  . Colonoscopy w/ polypectomy   . Left flank exploration w/  partial left nephrectomy/ left hilar lymph node bx 04-17-2005  DR Deandra Gadson    RENAL CELL CARCINOMA  . Laparoscopic assisted vaginal hysterectomy 1993    W/ BILATERAL SALPINGO-OOPHORECTOMY  . Right rotator  cuff repair/   bicep repair 10-17-2011  DR SUPPLE  . Appendectomy 1993    W/ LAVH  . Cystoscopy with stent placement 02/07/2012    Procedure: CYSTOSCOPY WITH STENT PLACEMENT;  Surgeon: Marcine Matar, MD;  Location: [facility];  Service: Urology;  Laterality: Left;  . Lithotripsy 11-22013    Home Medications:  Medications Prior to Admission   Medication Sig Dispense Refill  . acetaminophen (TYLENOL) 500 MG tablet Take 1,000 mg by mouth every 6 (six) hours as needed. Pain      . diphenoxylate-atropine (LOMOTIL) 2.5-0.025 MG per tablet Take 1 tablet by mouth 4 (four) times daily as needed. For diarrhea.      . mirabegron ER (MYRBETRIQ) 25 MG TB24 Take 25 mg by mouth daily.      Marland Kitchen trimethoprim (TRIMPEX) 100 MG tablet Take 100 mg by mouth at bedtime.       . Cyanocobalamin (VITAMIN B-12 IJ) Inject as directed every 30 (thirty) days.       . naproxen (NAPROSYN) 500 MG tablet Take 1 tablet (500 mg total) by mouth 2 (two) times daily with a meal.  60 tablet  1    Allergies:  Allergies  Allergen Reactions  . Latex Rash  . Morphine And Related Other (See Comments)    hallucinations    Family History  Problem Relation Age of Onset  . Cancer Son     Social History:  reports that she has never smoked. She has never used smokeless tobacco. She reports that she does not drink alcohol or use illicit drugs.  ROS: A complete review of systems was performed.  All systems are negative except for pertinent findings as noted. Frequency, urgency.  Physical Exam:  Vital signs in last 24 hours: Temp:  [  96.8 F (36 C)] 96.8 F (36 C) (12/23 0533) Pulse Rate:  [82] 82  (12/23 0533) Resp:  [18] 18  (12/23 0533) BP: (131)/(78) 131/78 mmHg (12/23 0533) SpO2:  [99 %] 99 % (12/23 0533) Weight:  [84.426 kg (186 lb 2 oz)] 84.426 kg (186 lb 2 oz) (12/23 0533)  General:  Alert and oriented, No acute distress HEENT: Normocephalic, atraumatic Neck: No JVD or lymphadenopathy Cardiovascular: Regular rate and rhythm Lungs: Clear bilaterally Abdomen: Soft, nontender, nondistended, no abdominal masses Back: No CVA tenderness Extremities: No edema Neurologic: Grossly intact  Laboratory Data:  No results found for this or any previous visit (from the past 24 hour(s)). No results found for this or any previous visit (from the past 240  hour(s)). Creatinine: No results found for this basename: CREATININE:7 in the last 168 hours  Radiologic Imaging: No results found.  Impression/Assessment:  Large branched left renal stone, s/p 1st stage ESL  Plan:  2nd stage left ESL  Chelsea Aus 03/09/2012, 6:25 AM  [name]. Jorian Willhoite MD

## 2012-03-12 ENCOUNTER — Other Ambulatory Visit (HOSPITAL_COMMUNITY): Payer: Self-pay | Admitting: Family Medicine

## 2012-03-12 DIAGNOSIS — Z139 Encounter for screening, unspecified: Secondary | ICD-10-CM

## 2012-03-16 ENCOUNTER — Ambulatory Visit (HOSPITAL_COMMUNITY)
Admission: RE | Admit: 2012-03-16 | Discharge: 2012-03-16 | Disposition: A | Discharge status: Home / Self Care | Payer: 59 | Source: Ambulatory Visit | Attending: Family Medicine | Admitting: Family Medicine

## 2012-03-16 DIAGNOSIS — Z139 Encounter for screening, unspecified: Secondary | ICD-10-CM

## 2012-03-16 DIAGNOSIS — R922 Inconclusive mammogram: Secondary | ICD-10-CM | POA: Insufficient documentation

## 2012-03-24 ENCOUNTER — Other Ambulatory Visit: Payer: Self-pay | Admitting: Family Medicine

## 2012-03-24 DIAGNOSIS — R928 Other abnormal and inconclusive findings on diagnostic imaging of breast: Secondary | ICD-10-CM

## 2012-03-30 ENCOUNTER — Encounter (HOSPITAL_COMMUNITY): Payer: 59 | Attending: Oncology

## 2012-03-30 VITALS — BP 157/97 | HR 76 | Temp 98.0°F | Resp 16

## 2012-03-30 DIAGNOSIS — E538 Deficiency of other specified B group vitamins: Secondary | ICD-10-CM | POA: Insufficient documentation

## 2012-03-30 MED ORDER — CYANOCOBALAMIN 1000 MCG/ML IJ SOLN
1000.0000 ug | Freq: Once | INTRAMUSCULAR | Status: AC
  Administered 2012-03-30: 1000 ug via INTRAMUSCULAR

## 2012-03-30 MED ORDER — CYANOCOBALAMIN 1000 MCG/ML IJ SOLN
INTRAMUSCULAR | Status: AC
  Filled 2012-03-30: qty 1

## 2012-03-30 NOTE — Progress Notes (Signed)
Nicole [name] presents today for injection per MD orders. B12 1000mcg administered IM in left Upper Arm. Administration without incident. Patient tolerated well.  

## 2012-04-01 ENCOUNTER — Other Ambulatory Visit: Payer: Self-pay | Admitting: Family Medicine

## 2012-04-01 ENCOUNTER — Encounter (HOSPITAL_COMMUNITY): Payer: 59

## 2012-04-01 ENCOUNTER — Ambulatory Visit (HOSPITAL_COMMUNITY)
Admission: RE | Admit: 2012-04-01 | Discharge: 2012-04-01 | Disposition: A | Discharge status: Home / Self Care | Payer: 59 | Source: Ambulatory Visit | Attending: Family Medicine | Admitting: Family Medicine

## 2012-04-01 ENCOUNTER — Other Ambulatory Visit (HOSPITAL_COMMUNITY): Payer: Self-pay | Admitting: Family Medicine

## 2012-04-01 DIAGNOSIS — R928 Other abnormal and inconclusive findings on diagnostic imaging of breast: Secondary | ICD-10-CM

## 2012-04-01 DIAGNOSIS — N6009 Solitary cyst of unspecified breast: Secondary | ICD-10-CM | POA: Insufficient documentation

## 2012-04-06 ENCOUNTER — Other Ambulatory Visit: Payer: Self-pay | Admitting: Urology

## 2012-04-06 ENCOUNTER — Ambulatory Visit (HOSPITAL_COMMUNITY)
Admission: RE | Admit: 2012-04-06 | Discharge: 2012-04-06 | Disposition: A | Discharge status: Home / Self Care | Payer: 59 | Source: Ambulatory Visit | Attending: Urology | Admitting: Urology

## 2012-04-06 DIAGNOSIS — R109 Unspecified abdominal pain: Secondary | ICD-10-CM | POA: Insufficient documentation

## 2012-04-06 DIAGNOSIS — N200 Calculus of kidney: Secondary | ICD-10-CM

## 2012-04-07 ENCOUNTER — Ambulatory Visit (INDEPENDENT_AMBULATORY_CARE_PROVIDER_SITE_OTHER): Payer: 59 | Admitting: Urology

## 2012-04-07 DIAGNOSIS — N200 Calculus of kidney: Secondary | ICD-10-CM

## 2012-04-14 ENCOUNTER — Ambulatory Visit (INDEPENDENT_AMBULATORY_CARE_PROVIDER_SITE_OTHER): Payer: 59 | Admitting: Urology

## 2012-04-14 ENCOUNTER — Other Ambulatory Visit: Payer: Self-pay | Admitting: Urology

## 2012-04-14 DIAGNOSIS — N200 Calculus of kidney: Secondary | ICD-10-CM

## 2012-04-14 DIAGNOSIS — N3000 Acute cystitis without hematuria: Secondary | ICD-10-CM

## 2012-04-28 ENCOUNTER — Encounter (HOSPITAL_COMMUNITY): Payer: 59 | Attending: Oncology

## 2012-04-28 DIAGNOSIS — E538 Deficiency of other specified B group vitamins: Secondary | ICD-10-CM | POA: Insufficient documentation

## 2012-04-28 MED ORDER — CYANOCOBALAMIN 1000 MCG/ML IJ SOLN
1000.0000 ug | Freq: Once | INTRAMUSCULAR | Status: AC
  Administered 2012-04-28: 1000 ug via INTRAMUSCULAR

## 2012-04-28 MED ORDER — CYANOCOBALAMIN 1000 MCG/ML IJ SOLN
INTRAMUSCULAR | Status: AC
  Filled 2012-04-28: qty 1

## 2012-04-28 MED ORDER — CYANOCOBALAMIN 1000 MCG/ML IJ SOLN: 1000.0000 ug | Freq: Once | INTRAMUSCULAR | Status: DC

## 2012-04-28 NOTE — Progress Notes (Signed)
Nicole [name] presents today for injection per MD orders. B12 1000mcg administered SQ in right Upper Arm. Administration without incident. Patient tolerated well.  

## 2012-04-30 ENCOUNTER — Ambulatory Visit (HOSPITAL_COMMUNITY): Payer: 59

## 2012-05-02 ENCOUNTER — Other Ambulatory Visit: Payer: Self-pay

## 2012-05-15 ENCOUNTER — Ambulatory Visit (HOSPITAL_COMMUNITY)
Admission: RE | Admit: 2012-05-15 | Discharge: 2012-05-15 | Disposition: A | Discharge status: Home / Self Care | Payer: 59 | Source: Ambulatory Visit | Attending: Urology | Admitting: Urology

## 2012-05-15 DIAGNOSIS — R109 Unspecified abdominal pain: Secondary | ICD-10-CM | POA: Insufficient documentation

## 2012-05-15 DIAGNOSIS — N200 Calculus of kidney: Secondary | ICD-10-CM | POA: Insufficient documentation

## 2012-05-19 ENCOUNTER — Ambulatory Visit (INDEPENDENT_AMBULATORY_CARE_PROVIDER_SITE_OTHER): Payer: 59 | Admitting: Urology

## 2012-05-19 ENCOUNTER — Other Ambulatory Visit: Payer: Self-pay | Admitting: Urology

## 2012-05-19 DIAGNOSIS — N200 Calculus of kidney: Secondary | ICD-10-CM

## 2012-05-25 ENCOUNTER — Encounter (HOSPITAL_COMMUNITY): Payer: 59 | Attending: Oncology

## 2012-05-25 VITALS — BP 152/85 | HR 80 | Resp 16

## 2012-05-25 DIAGNOSIS — E538 Deficiency of other specified B group vitamins: Secondary | ICD-10-CM | POA: Insufficient documentation

## 2012-05-25 MED ORDER — CYANOCOBALAMIN 1000 MCG/ML IJ SOLN
INTRAMUSCULAR | Status: AC
  Filled 2012-05-25: qty 1

## 2012-05-25 MED ORDER — CYANOCOBALAMIN 1000 MCG/ML IJ SOLN
1000.0000 ug | Freq: Once | INTRAMUSCULAR | Status: AC
  Administered 2012-05-25: 1000 ug via INTRAMUSCULAR

## 2012-05-25 NOTE — Progress Notes (Signed)
Annick B Coutant presents today for injection per MD orders. B12 1000mcg administered SQ in left Upper Arm. Administration without incident. Patient tolerated well.  

## 2012-06-22 ENCOUNTER — Encounter (HOSPITAL_COMMUNITY): Payer: 59 | Attending: Oncology

## 2012-06-22 VITALS — BP 136/86 | HR 83

## 2012-06-22 DIAGNOSIS — E538 Deficiency of other specified B group vitamins: Secondary | ICD-10-CM | POA: Insufficient documentation

## 2012-06-22 MED ORDER — CYANOCOBALAMIN 1000 MCG/ML IJ SOLN
1000.0000 ug | Freq: Once | INTRAMUSCULAR | Status: AC
  Administered 2012-06-22: 1000 ug via INTRAMUSCULAR

## 2012-06-22 NOTE — Progress Notes (Signed)
Nicole [name] presents today for injection per MD orders. B12 1000mcg administered IM in left Upper Arm. Administration without incident. Patient tolerated well.  

## 2012-07-20 ENCOUNTER — Encounter (HOSPITAL_COMMUNITY): Payer: 59 | Attending: Oncology

## 2012-07-20 VITALS — BP 158/90 | HR 91

## 2012-07-20 DIAGNOSIS — E538 Deficiency of other specified B group vitamins: Secondary | ICD-10-CM | POA: Insufficient documentation

## 2012-07-20 MED ORDER — CYANOCOBALAMIN 1000 MCG/ML IJ SOLN
1000.0000 ug | Freq: Once | INTRAMUSCULAR | Status: AC
  Administered 2012-07-20: 1000 ug via INTRAMUSCULAR

## 2012-07-20 MED ORDER — CYANOCOBALAMIN 1000 MCG/ML IJ SOLN
INTRAMUSCULAR | Status: AC
  Filled 2012-07-20: qty 1

## 2012-07-20 NOTE — Progress Notes (Signed)
VSS.  Vitamin b-12 given IM z-track to left deltoid.Tolerated well.

## 2012-08-17 ENCOUNTER — Encounter (INDEPENDENT_AMBULATORY_CARE_PROVIDER_SITE_OTHER): Payer: Self-pay | Admitting: *Deleted

## 2012-08-18 ENCOUNTER — Ambulatory Visit (HOSPITAL_COMMUNITY): Payer: 59

## 2012-08-20 ENCOUNTER — Encounter (HOSPITAL_COMMUNITY): Payer: 59 | Attending: Oncology

## 2012-08-20 VITALS — BP 122/79 | HR 79

## 2012-08-20 DIAGNOSIS — E538 Deficiency of other specified B group vitamins: Secondary | ICD-10-CM | POA: Insufficient documentation

## 2012-08-20 MED ORDER — CYANOCOBALAMIN 1000 MCG/ML IJ SOLN
1000.0000 ug | Freq: Once | INTRAMUSCULAR | Status: AC
  Administered 2012-08-20: 1000 ug via INTRAMUSCULAR

## 2012-08-20 MED ORDER — CYANOCOBALAMIN 1000 MCG/ML IJ SOLN
INTRAMUSCULAR | Status: AC
  Filled 2012-08-20: qty 1

## 2012-08-20 NOTE — Progress Notes (Signed)
Katricia B Isaacs presents today for injection per MD orders. B12 1000mcg administered IM in right Upper Arm. Administration without incident. Patient tolerated well.  

## 2012-08-21 ENCOUNTER — Ambulatory Visit (HOSPITAL_COMMUNITY)
Admission: RE | Admit: 2012-08-21 | Discharge: 2012-08-21 | Disposition: A | Discharge status: Home / Self Care | Payer: 59 | Source: Ambulatory Visit | Attending: Urology | Admitting: Urology

## 2012-08-21 DIAGNOSIS — R9389 Abnormal findings on diagnostic imaging of other specified body structures: Secondary | ICD-10-CM | POA: Insufficient documentation

## 2012-08-21 DIAGNOSIS — N200 Calculus of kidney: Secondary | ICD-10-CM | POA: Insufficient documentation

## 2012-08-25 ENCOUNTER — Other Ambulatory Visit: Payer: Self-pay | Admitting: Urology

## 2012-08-25 ENCOUNTER — Ambulatory Visit (INDEPENDENT_AMBULATORY_CARE_PROVIDER_SITE_OTHER): Payer: 59 | Admitting: Urology

## 2012-08-25 DIAGNOSIS — N201 Calculus of ureter: Secondary | ICD-10-CM

## 2012-08-25 DIAGNOSIS — N390 Urinary tract infection, site not specified: Secondary | ICD-10-CM

## 2012-09-03 ENCOUNTER — Telehealth (INDEPENDENT_AMBULATORY_CARE_PROVIDER_SITE_OTHER): Payer: Self-pay | Admitting: *Deleted

## 2012-09-03 ENCOUNTER — Other Ambulatory Visit (INDEPENDENT_AMBULATORY_CARE_PROVIDER_SITE_OTHER): Payer: Self-pay | Admitting: *Deleted

## 2012-09-03 ENCOUNTER — Encounter (INDEPENDENT_AMBULATORY_CARE_PROVIDER_SITE_OTHER): Payer: Self-pay | Admitting: *Deleted

## 2012-09-03 DIAGNOSIS — Z85038 Personal history of other malignant neoplasm of large intestine: Secondary | ICD-10-CM

## 2012-09-03 NOTE — Telephone Encounter (Signed)
agree

## 2012-09-03 NOTE — Telephone Encounter (Signed)
  Procedure: flex sig  Reason/Indication:  Hx colon ca  Has patient had this procedure before?  Yes, 08/2011  If so, when, by whom and where?    Is there a family history of colon cancer?    Who?  What age when diagnosed?    Is patient diabetic?   no      Does patient have prosthetic heart valve?  no  Do you have a pacemaker?  no  Has patient ever had endocarditis? no  Has patient had joint replacement within last 12 months?  no  Is patient on Coumadin, Plavix and/or Aspirin? no  Medications: see EPIC  Allergies: see EPIC  Medication Adjustment:   Procedure date & time: 09/28/12 at 730

## 2012-09-10 ENCOUNTER — Encounter (HOSPITAL_COMMUNITY): Payer: Self-pay | Admitting: Pharmacy Technician

## 2012-09-17 ENCOUNTER — Encounter (HOSPITAL_COMMUNITY): Payer: 59 | Attending: Oncology

## 2012-09-17 DIAGNOSIS — E538 Deficiency of other specified B group vitamins: Secondary | ICD-10-CM | POA: Insufficient documentation

## 2012-09-17 DIAGNOSIS — C189 Malignant neoplasm of colon, unspecified: Secondary | ICD-10-CM | POA: Insufficient documentation

## 2012-09-17 DIAGNOSIS — Z1509 Genetic susceptibility to other malignant neoplasm: Secondary | ICD-10-CM | POA: Insufficient documentation

## 2012-09-17 DIAGNOSIS — C8589 Other specified types of non-Hodgkin lymphoma, extranodal and solid organ sites: Secondary | ICD-10-CM | POA: Insufficient documentation

## 2012-09-17 DIAGNOSIS — Z800 Family history of malignant neoplasm of digestive organs: Secondary | ICD-10-CM | POA: Insufficient documentation

## 2012-09-17 MED ORDER — CYANOCOBALAMIN 1000 MCG/ML IJ SOLN
1000.0000 ug | Freq: Once | INTRAMUSCULAR | Status: AC
  Administered 2012-09-17: 1000 ug via INTRAMUSCULAR

## 2012-09-17 MED ORDER — CYANOCOBALAMIN 1000 MCG/ML IJ SOLN
INTRAMUSCULAR | Status: AC
  Filled 2012-09-17: qty 1

## 2012-09-17 NOTE — Progress Notes (Signed)
Nicole [name] presents today for injection per MD orders. B12 1000mcg administered IM in left Upper Arm. Administration without incident. Patient tolerated well.  

## 2012-09-28 ENCOUNTER — Encounter (HOSPITAL_COMMUNITY)
Admission: RE | Disposition: A | Discharge status: Home / Self Care | Payer: Self-pay | Source: Ambulatory Visit | Attending: Internal Medicine

## 2012-09-28 ENCOUNTER — Ambulatory Visit (HOSPITAL_COMMUNITY)
Admission: RE | Admit: 2012-09-28 | Discharge: 2012-09-28 | Disposition: A | Discharge status: Home / Self Care | Payer: 59 | Source: Ambulatory Visit | Attending: Internal Medicine | Admitting: Internal Medicine

## 2012-09-28 ENCOUNTER — Encounter (HOSPITAL_COMMUNITY): Payer: Self-pay | Admitting: *Deleted

## 2012-09-28 DIAGNOSIS — Z9049 Acquired absence of other specified parts of digestive tract: Secondary | ICD-10-CM | POA: Insufficient documentation

## 2012-09-28 DIAGNOSIS — Z85038 Personal history of other malignant neoplasm of large intestine: Secondary | ICD-10-CM

## 2012-09-28 DIAGNOSIS — Z1509 Genetic susceptibility to other malignant neoplasm: Secondary | ICD-10-CM

## 2012-09-28 DIAGNOSIS — Z09 Encounter for follow-up examination after completed treatment for conditions other than malignant neoplasm: Secondary | ICD-10-CM | POA: Insufficient documentation

## 2012-09-28 HISTORY — PX: FLEXIBLE SIGMOIDOSCOPY: SHX5431

## 2012-09-28 HISTORY — DX: Personal history of urinary calculi: Z87.442

## 2012-09-28 SURGERY — SIGMOIDOSCOPY, FLEXIBLE
Anesthesia: Moderate Sedation

## 2012-09-28 MED ORDER — DIPHENOXYLATE-ATROPINE 2.5-0.025 MG PO TABS: 1.0000 | ORAL_TABLET | Freq: Three times a day (TID) | ORAL | Status: DC

## 2012-09-28 MED ORDER — MIDAZOLAM HCL 5 MG/5ML IJ SOLN
INTRAMUSCULAR | Status: AC
  Filled 2012-09-28: qty 10

## 2012-09-28 MED ORDER — STERILE WATER FOR IRRIGATION IR SOLN
Status: DC | PRN
  Administered 2012-09-28: 07:00:00

## 2012-09-28 MED ORDER — MEPERIDINE HCL 50 MG/ML IJ SOLN
INTRAMUSCULAR | Status: AC
  Filled 2012-09-28: qty 1

## 2012-09-28 NOTE — H&P (Signed)
Nicole [name] is an 62 y.o. female.   Chief Complaint: Patient is here for flexible sigmoidoscopy. HPI: Patient is 62 year old Caucasian female with history of Lynch syndrome and colon carcinoma who is here for surveillance examination. Her last flexible sigmoidoscopy was in June 2013. She denies melena or rectal bleeding. She generally has 5 stools per day. Most of stools or soft mushy and rarely normal. She has good appetite and denies weight loss.  Past Medical History  Diagnosis Date  . B12 deficiency   . Right shoulder pain S/P ROTATOR CUFF REPAIR  . Chronic diarrhea SECONDARY HX COLON CANCER  . Personal history of renal cancer S/P LEFT PARTIAL NEPHRECTOMY 2007--  NO RECURRENCE  . History of colon cancer [facility] SYNDROME ---  COLORECTAL CANCER S/P COLECTOMY X3  LAST ONE 2007    NO RECURRENCE  . Lynch syndrome 10/17/2010    HX OF  . NHL (non-Hodgkin's lymphoma) 10/17/2010    MONITORED BY DR Mariel Sleet  . Arthritis     shorulder  . History of kidney stones     Past Surgical History  Procedure Laterality Date  . Hemicolectomy  2001    right  . Hemicolectomy  2002    left  . Kidney resection  2007    left partial  . Subtotal colectomy  2007  . Flexible sigmoidoscopy  08/29/2011    Procedure: FLEXIBLE SIGMOIDOSCOPY;  Surgeon: Malissa Hippo, MD;  [facility];  Service: Endoscopy;  Laterality: N/A;  930  . Colonoscopy w/ polypectomy    . Left flank exploration w/  partial left nephrectomy/ left hilar lymph node bx  04-17-2005  DR DAHLSTEDT    RENAL CELL CARCINOMA  . Laparoscopic assisted vaginal hysterectomy  1993    W/ BILATERAL SALPINGO-OOPHORECTOMY  . Right rotator  cuff repair/   bicep repair  10-17-2011  DR SUPPLE  . Appendectomy  1993    W/ LAVH  . Cystoscopy with stent placement  02/07/2012    Procedure: CYSTOSCOPY WITH STENT PLACEMENT;  Surgeon: Marcine Matar, MD;  Location: [facility];  Service: Urology;  Laterality: Left;  . Lithotripsy   11-22013    x2    Family History  Problem Relation Age of Onset  . Cancer Son    Social History:  reports that she has never smoked. She has never used smokeless tobacco. She reports that she does not drink alcohol or use illicit drugs.  Allergies:  Allergies  Allergen Reactions  . Latex Rash  . Morphine And Related Other (See Comments)    hallucinations    Medications Prior to Admission  Medication Sig Dispense Refill  . Cyanocobalamin (VITAMIN B-12 IJ) Inject as directed every 30 (thirty) days.       . naproxen (NAPROSYN) 500 MG tablet Take 500 mg by mouth 2 (two) times daily with a meal.      . acetaminophen (TYLENOL) 500 MG tablet Take 1,000 mg by mouth every 6 (six) hours as needed. Pain        No results found for this or any previous visit (from the past 48 hour(s)). No results found.  ROS  Blood pressure 143/78, temperature 98.2 F (36.8 C), temperature source Oral, resp. rate 21, height 5\' 1"  (1.549 m), weight 185 lb (83.915 kg), SpO2 99.00%. Physical Exam  Constitutional: She appears well-developed and well-nourished.  HENT:  Mouth/Throat: Oropharynx is clear and moist.  Eyes: Conjunctivae are normal.  Neck: No thyromegaly present.  Cardiovascular: Normal rate, regular  rhythm and normal heart sounds.   No murmur heard. Respiratory: Effort normal and breath sounds normal.  GI: Soft. She exhibits no distension and no mass. There is no tenderness.  Musculoskeletal: She exhibits no edema.  Lymphadenopathy:    She has no cervical adenopathy.  Neurological: She is alert.  Skin: Skin is warm.     Assessment/Plan History of colon carcinoma. History of Lynch syndrome. Surveillance flexible sigmoidoscopy  Hal Norrington U 09/28/2012, 7:30 AM

## 2012-09-28 NOTE — Progress Notes (Signed)
Pt is having flex sig with no sedation per pt. I paged Dr. Karilyn Cota to ask if he wanted an IV and he gave order to hold off on IV. Order initiated.

## 2012-09-28 NOTE — OR Nursing (Signed)
At anastamosis at [phone].

## 2012-09-28 NOTE — Op Note (Signed)
FLEXIBLE SIGMOIDOSCOPY  PROCEDURE REPORT  PATIENT: Nicole [name] MR#: [id number]  Birthdate: 1950/04/06, 62 y.o., female  Endoscopist: Dr. Malissa Hippo, MD  Referred By: Dr. Mila Homer. Sudie Bailey, MD  Procedure Date: 09/28/2012. Procedure: Flexible sigmoidoscopy.   Indications: Patient is 62 year old Caucasian female with history of colon carcinoma and limb syndrome who is undergoing surveillance examination. She only has rectum and rectosigmoid junction remaining. Last exam was in may 2012.  Informed Consent: The procedure and risks were reviewed with the patient and informed consent was obtained.   Medications:  None   Description of procedure:  Patient was placed in left lateral recumbent position and rectal examination performed. No abnormality noted an external or digital exam. Pentex video scope was placed in the rectum and advanced into rectosigmoid junction where ileo-rectal  anastomosis was identified. It was patent. Distal small bowel mucosa was normal. As the scope was withdrawn rectosigmoid junction and rectal mucosa was carefully examined along with retroflexion of the scope.  Patient was turned in supine position and on the right side in order to move stool to examine underlying mucosa. Findings:  Normal distal small bowel.  Normal ileocolonic anastomosis at rectosigmoid junction.  Normal rectal mucosa.   Therapeutic/Diagnostic Maneuvers Performed: None  Complications: None   Impression:  Normal limited flexible sigmoidoscopy.   Recommendations:  Next exam in one year.   REHMAN,NAJEEB U 09/28/2012  7:54 AM   CC: Dr. [name], MD & Dr. Bonnetta Barry ref. provider found

## 2012-09-29 ENCOUNTER — Encounter (HOSPITAL_COMMUNITY): Payer: Self-pay | Admitting: Internal Medicine

## 2012-10-07 ENCOUNTER — Encounter (HOSPITAL_BASED_OUTPATIENT_CLINIC_OR_DEPARTMENT_OTHER): Payer: 59

## 2012-10-07 ENCOUNTER — Encounter (HOSPITAL_COMMUNITY): Payer: Self-pay

## 2012-10-07 VITALS — BP 157/96 | HR 76 | Temp 98.3°F | Resp 18 | Wt 191.2 lb

## 2012-10-07 DIAGNOSIS — C8589 Other specified types of non-Hodgkin lymphoma, extranodal and solid organ sites: Secondary | ICD-10-CM

## 2012-10-07 DIAGNOSIS — Z800 Family history of malignant neoplasm of digestive organs: Secondary | ICD-10-CM

## 2012-10-07 DIAGNOSIS — E538 Deficiency of other specified B group vitamins: Secondary | ICD-10-CM

## 2012-10-07 DIAGNOSIS — C8590 Non-Hodgkin lymphoma, unspecified, unspecified site: Secondary | ICD-10-CM

## 2012-10-07 DIAGNOSIS — Z1509 Genetic susceptibility to other malignant neoplasm: Secondary | ICD-10-CM

## 2012-10-07 DIAGNOSIS — C189 Malignant neoplasm of colon, unspecified: Secondary | ICD-10-CM

## 2012-10-07 NOTE — Patient Instructions (Addendum)
.  [facility] Discharge Instructions  RECOMMENDATIONS MADE BY THE CONSULTANT AND ANY TEST RESULTS WILL BE SENT TO YOUR REFERRING PHYSICIAN.  EXAM FINDINGS BY THE PHYSICIAN TODAY AND SIGNS OR SYMPTOMS TO REPORT TO CLINIC OR PRIMARY PHYSICIAN: Exam per Dr. Beckie Busing Your son should pursue genetic testing We will try to get genetic report from [facility] Report any new lumps, night sweats INSTRUCTIONS GIVEN AND DISCUSSED: B12 monthly Labs in January  SPECIAL INSTRUCTIONS/FOLLOW-UP: 6 months follow up Dermatology appt with in next 2 months Thank you for choosing [facility] to provide your oncology and hematology care.  To afford each patient quality time with our providers, please arrive at least 15 minutes before your scheduled appointment time.  With your help, our goal is to use those 15 minutes to complete the necessary work-up to ensure our physicians have the information they need to help with your evaluation and healthcare recommendations.    Effective January 1st, 2014, we ask that you re-schedule your appointment with our physicians should you arrive 10 or more minutes late for your appointment.  We strive to give you quality time with our providers, and arriving late affects you and other patients whose appointments are after yours.    Again, thank you for choosing [facility].  Our hope is that these requests will decrease the amount of time that you wait before being seen by our physicians.       _____________________________________________________________  Should you have questions after your visit to [facility], please contact our office at [phone] between the hours of 8:30 a.m. and 5:00 p.m.  Voicemails left after 4:30 p.m. will not be returned until the following business day.  For prescription refill requests, have your pharmacy contact our office with your prescription refill request.

## 2012-10-07 NOTE — Progress Notes (Addendum)
[facility] [phone]   [phone]  OFFICE PROGRESS NOTE  Nicole Obey, MD [address] [place] [address]  DIAGNOSIS: 1.  History of Lynch syndrome with 3 colon cancers documented in her records.                        2. History the CD20 positive focal lymphoma.  ONCOLOGIC HISTORY: According to Dr. Mariel Sleet; Lynch syndrome with a history of renal cell carcinoma non-Hodgkin's lymphoma and 3 colon cancers thus far no recurrences.  Her non-Hodgkin's lymphoma follicular well-differentiated try CD20 positive grade 1 was found at time of for sigmoid colon resection in 2007 with involvement of 2 lymph nodes at that time.   Colon cancer status post subtotal colectomy Dr. Byrd Hesselbach in [place] was 12/11/2005 and she has sigmoidoscopy once a year by Dr. Karilyn Cota we will do CEAs just once a year.  Patient also gets regular monthly B12 injections. She reports being compliant with this.  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 And tells m.e that she feels fatigued after a day's work which is not unusual  INTERVAL HISTORY:   Nicole [name] 62 y.o. female returns to the clinic today for scheduled follow up.  She tells me she had genetic testing  For Lynch syndrome but I was not able to access the results at  The time of this note.Patient reportedly has a copy at home which promises to make available to [place].  She had a recent normal limited flexible sigmoidoscopy. She denies any blood in his stool or melena.  She is concerned about them on the left but of her forehead and left calf.   Family history: Patient has 2 sons one died of pancreatic cancer in his 30s.  His other son had colonoscopy last in 2004 according to patient and does not wish to pursue any further surveillance colonoscopy.  Reportedly is not interested in taking his status as regards Lynch syndrome. Is deceased son has 2 daughters.  The status as regards  Lynch syndrome is unknown. There is also family history of breast cancer.   MEDICAL HISTORY: Past Medical History  Diagnosis Date  . B12 deficiency   . Right shoulder pain S/P ROTATOR CUFF REPAIR  . Chronic diarrhea SECONDARY HX COLON CANCER  . Personal history of renal cancer S/P LEFT PARTIAL NEPHRECTOMY 2007--  NO RECURRENCE  . History of colon cancer [facility] SYNDROME ---  COLORECTAL CANCER S/P COLECTOMY X3  LAST ONE 2007    NO RECURRENCE  . Lynch syndrome 10/17/2010    HX OF  . NHL (non-Hodgkin's lymphoma) 10/17/2010    MONITORED BY DR Mariel Sleet  . Arthritis     shorulder  . History of kidney stones     ALLERGIES:  is allergic to latex and morphine and related.  MEDICATIONS:  Current Outpatient Prescriptions  Medication Sig Dispense Refill  . acetaminophen (TYLENOL) 500 MG tablet Take 1,000 mg by mouth every 6 (six) hours as needed. Pain      . Cyanocobalamin (VITAMIN B-12 IJ) Inject as directed every 30 (thirty) days.       . naproxen (NAPROSYN) 500 MG tablet Take 500 mg by mouth 2 (two) times daily with a meal.      . diphenoxylate-atropine (LOMOTIL) 2.5-0.025 MG per tablet Take 1 tablet by mouth 3 (three) times daily before meals.  90 tablet  5  No current facility-administered medications for this visit.    SURGICAL HISTORY:  Past Surgical History  Procedure Laterality Date  . Hemicolectomy  2001    right  . Hemicolectomy  2002    left  . Kidney resection  2007    left partial  . Subtotal colectomy  2007  . Flexible sigmoidoscopy  08/29/2011    Procedure: FLEXIBLE SIGMOIDOSCOPY;  Surgeon: Malissa Hippo, MD;  [facility];  Service: Endoscopy;  Laterality: N/A;  930  . Colonoscopy w/ polypectomy    . Left flank exploration w/  partial left nephrectomy/ left hilar lymph node bx  04-17-2005  DR DAHLSTEDT    RENAL CELL CARCINOMA  . Laparoscopic assisted vaginal hysterectomy  1993    W/ BILATERAL SALPINGO-OOPHORECTOMY  . Right rotator  cuff repair/   bicep  repair  10-17-2011  DR SUPPLE  . Appendectomy  1993    W/ LAVH  . Cystoscopy with stent placement  02/07/2012    Procedure: CYSTOSCOPY WITH STENT PLACEMENT;  Surgeon: Marcine Matar, MD;  Location: [facility];  Service: Urology;  Laterality: Left;  . Lithotripsy  11-22013    x2  . Flexible sigmoidoscopy N/A 09/28/2012    Procedure: FLEXIBLE SIGMOIDOSCOPY;  Surgeon: Malissa Hippo, MD;  [facility];  Service: Endoscopy;  Laterality: N/A;  730     REVIEW OF SYSTEMS:14 point review of system is as in the history above otherwise negative.  PHYSICAL EXAMINATION:  Blood pressure 157/96, pulse 76, temperature 98.3 F (36.8 C), temperature source Oral, resp. rate 18, weight 191 lb 3.2 oz (86.728 kg). GENERAL: No distress.obese SKIN:  No rashes or significant lesions . Solar keratosis lesion in the left part of the forehead and suspicious actinic keratosis lesion in the left  Calf. HEAD: Normocephalic, No masses, lesions, tenderness or abnormalities  EYES: Conjunctiva are pink and non-injected  ENT: External ears normal ,lips, buccal mucosa, and tongue normal and mucous membranes are moist  LYMPH: No palpable lymphadenopathy, in the neck, supraclavicular, axillary or inguinal lymph node areas. LUNGS: clear to auscultation , no crackles or wheezes HEART: regular rate & rhythm, no murmurs, no gallops, S1 normal and S2 normal no S3 ABDOMEN: Abdomen soft, non-tender, normal bowel sounds, no masses or organomegaly and no hepatosplenomegaly palpable EXTREMITIES: No edema, no skin discoloration or tenderness NEURO: alert & oriented , no focal motor/sensory deficits.     LABORATORY DATA: Lab Results  Component Value Date   WBC 8.4 10/07/2011   HGB 13.3 02/07/2012   HCT 39.0 02/07/2012   MCV 90.7 10/07/2011   PLT 174 10/07/2011      Chemistry      Component Value Date/Time   NA 141 02/07/2012 1134   K 3.7 02/07/2012 1134   CL 107 02/07/2012 1134   CO2 25  10/07/2011 1250   BUN 19 02/07/2012 1134   CREATININE 0.80 02/07/2012 1134      Component Value Date/Time   CALCIUM 9.1 10/07/2011 1250   ALKPHOS 74 10/17/2010 1238   AST 15 10/17/2010 1238   ALT 17 10/17/2010 1238   BILITOT 0.3 10/17/2010 1238       RADIOGRAPHIC STUDIES: No results found.   ASSESSMENT: Ms. Ra has no evidence of disease at this time.  Given her history of Lynch syndrome I think that continued surveillance is warranted.  In this case I think that repeat lower GI endoscopy every one to 2 years is reasonable.  Have most recent looks  unremarkable according to the reports.  She also has no B. Symptoms as regards her previous history of follicular lymphoma.  Am very concerned about her living son and her granddaughters who do not know the status as regards Lynch syndrome.  His son died from pancreatic cancer which is one of the cancer is associated with Lynch syndrome is concerning especially considering high penetrance associated with this. The skin lesion on her left forehead I think is most likely benign, do well and her calf is suspicious for actinic keratosis and I recommended dermatology evaluation next week or 2. We had a long discussion in this regard.  PLAN:  1. Return to clinic in 6 months for follow up. 2. Dermatology follow up since patient already has one. 3. Patient will discuss with his son and granddaughters from her late son again to see if he will agree to think syndrome testing.we talked about the health consequences of not following through with recommended screening modalities should he have gene. 4. Patient has had hysterectomy and salpingo-oophorectomy which is prophylactic as regards Lynch syndrome. 5. We we'll try and retrieve syndrome test results.   All questions were satisfactorily answered. Patient knows to call if  any concern arises.  I spent more than 50 % counseling the patient face to face. The total time spent in the appointment was 40  minutes.   Sherral Hammers, MD FACP. Hematology/Oncology.     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

## 2012-10-14 ENCOUNTER — Encounter (HOSPITAL_BASED_OUTPATIENT_CLINIC_OR_DEPARTMENT_OTHER): Payer: 59

## 2012-10-14 VITALS — BP 156/82 | HR 94

## 2012-10-14 DIAGNOSIS — E538 Deficiency of other specified B group vitamins: Secondary | ICD-10-CM

## 2012-10-14 MED ORDER — CYANOCOBALAMIN 1000 MCG/ML IJ SOLN
1000.0000 ug | Freq: Once | INTRAMUSCULAR | Status: AC
  Administered 2012-10-14: 1000 ug via INTRAMUSCULAR

## 2012-10-14 MED ORDER — CYANOCOBALAMIN 1000 MCG/ML IJ SOLN
INTRAMUSCULAR | Status: AC
  Filled 2012-10-14: qty 1

## 2012-10-14 NOTE — Progress Notes (Signed)
Vitamin B-12 1000 mcg given IM Z-track to right deltoid..Tolerated well.

## 2012-10-15 ENCOUNTER — Ambulatory Visit (HOSPITAL_COMMUNITY): Payer: 59

## 2012-10-15 ENCOUNTER — Other Ambulatory Visit: Payer: Self-pay | Admitting: Advanced Practice Midwife

## 2012-11-12 ENCOUNTER — Encounter (HOSPITAL_COMMUNITY): Payer: 59 | Attending: Oncology

## 2012-11-12 DIAGNOSIS — E538 Deficiency of other specified B group vitamins: Secondary | ICD-10-CM

## 2012-11-12 MED ORDER — CYANOCOBALAMIN 1000 MCG/ML IJ SOLN
1000.0000 ug | Freq: Once | INTRAMUSCULAR | Status: AC
  Administered 2012-11-12: 1000 ug via INTRAMUSCULAR

## 2012-11-12 NOTE — Progress Notes (Signed)
Vitamin B 12 1000 mcg given to left deltoid muscle.  Tolerated well.

## 2012-12-10 ENCOUNTER — Encounter (HOSPITAL_COMMUNITY): Payer: 59 | Attending: Oncology

## 2012-12-10 DIAGNOSIS — E538 Deficiency of other specified B group vitamins: Secondary | ICD-10-CM | POA: Insufficient documentation

## 2012-12-10 MED ORDER — CYANOCOBALAMIN 1000 MCG/ML IJ SOLN
1000.0000 ug | Freq: Once | INTRAMUSCULAR | Status: AC
  Administered 2012-12-10: 1000 ug via INTRAMUSCULAR

## 2012-12-10 MED ORDER — CYANOCOBALAMIN 1000 MCG/ML IJ SOLN
INTRAMUSCULAR | Status: AC
  Filled 2012-12-10: qty 1

## 2012-12-10 NOTE — Progress Notes (Signed)
Vitamin B-12 1000 mcg given IM z-track to left deltoid.  Tolerated well.

## 2013-01-07 ENCOUNTER — Encounter (HOSPITAL_COMMUNITY): Payer: 59 | Attending: Oncology

## 2013-01-07 VITALS — BP 150/85 | HR 90 | Temp 97.6°F | Resp 18

## 2013-01-07 DIAGNOSIS — E538 Deficiency of other specified B group vitamins: Secondary | ICD-10-CM | POA: Insufficient documentation

## 2013-01-07 MED ORDER — CYANOCOBALAMIN 1000 MCG/ML IJ SOLN
INTRAMUSCULAR | Status: AC
  Filled 2013-01-07: qty 1

## 2013-01-07 MED ORDER — CYANOCOBALAMIN 1000 MCG/ML IJ SOLN
1000.0000 ug | Freq: Once | INTRAMUSCULAR | Status: AC
  Administered 2013-01-07: 1000 ug via INTRAMUSCULAR

## 2013-01-07 NOTE — Progress Notes (Signed)
Vitamin B 12 1000 mcg given IM Z-track to left deltoid without difficulty.  Tolerated well.

## 2013-01-21 ENCOUNTER — Other Ambulatory Visit: Payer: Self-pay

## 2013-02-09 ENCOUNTER — Encounter (HOSPITAL_COMMUNITY): Payer: 59 | Attending: Oncology

## 2013-02-09 VITALS — BP 135/72 | HR 87

## 2013-02-09 DIAGNOSIS — E538 Deficiency of other specified B group vitamins: Secondary | ICD-10-CM

## 2013-02-09 MED ORDER — CYANOCOBALAMIN 1000 MCG/ML IJ SOLN
INTRAMUSCULAR | Status: AC
  Filled 2013-02-09: qty 1

## 2013-02-09 MED ORDER — CYANOCOBALAMIN 1000 MCG/ML IJ SOLN
1000.0000 ug | Freq: Once | INTRAMUSCULAR | Status: AC
  Administered 2013-02-09: 1000 ug via INTRAMUSCULAR

## 2013-02-09 NOTE — Progress Notes (Signed)
Nicole [name] presents today for injection per MD orders. B12 1000mcg administered SQ in right Upper Arm. Administration without incident. Patient tolerated well.  

## 2013-02-25 ENCOUNTER — Ambulatory Visit (HOSPITAL_COMMUNITY)
Admission: RE | Admit: 2013-02-25 | Discharge: 2013-02-25 | Disposition: A | Discharge status: Home / Self Care | Payer: 59 | Source: Ambulatory Visit | Attending: Urology | Admitting: Urology

## 2013-02-25 DIAGNOSIS — N390 Urinary tract infection, site not specified: Secondary | ICD-10-CM | POA: Insufficient documentation

## 2013-02-25 DIAGNOSIS — N200 Calculus of kidney: Secondary | ICD-10-CM | POA: Insufficient documentation

## 2013-02-25 DIAGNOSIS — N1330 Unspecified hydronephrosis: Secondary | ICD-10-CM | POA: Insufficient documentation

## 2013-03-02 ENCOUNTER — Ambulatory Visit (INDEPENDENT_AMBULATORY_CARE_PROVIDER_SITE_OTHER): Payer: 59 | Admitting: Urology

## 2013-03-02 DIAGNOSIS — N200 Calculus of kidney: Secondary | ICD-10-CM

## 2013-03-02 DIAGNOSIS — C649 Malignant neoplasm of unspecified kidney, except renal pelvis: Secondary | ICD-10-CM

## 2013-03-09 ENCOUNTER — Encounter (HOSPITAL_COMMUNITY): Payer: 59 | Attending: Oncology

## 2013-03-09 VITALS — BP 163/80 | HR 101

## 2013-03-09 DIAGNOSIS — E538 Deficiency of other specified B group vitamins: Secondary | ICD-10-CM | POA: Insufficient documentation

## 2013-03-09 MED ORDER — CYANOCOBALAMIN 1000 MCG/ML IJ SOLN
1000.0000 ug | Freq: Once | INTRAMUSCULAR | Status: AC
  Administered 2013-03-09: 1000 ug via INTRAMUSCULAR

## 2013-03-09 MED ORDER — CYANOCOBALAMIN 1000 MCG/ML IJ SOLN
INTRAMUSCULAR | Status: AC
  Filled 2013-03-09: qty 1

## 2013-03-09 NOTE — Progress Notes (Signed)
Nicole [name] presents today for injection per MD orders. B12 1000mcg administered SQ in left Upper Arm. Administration without incident. Patient tolerated well.  

## 2013-03-16 ENCOUNTER — Other Ambulatory Visit (HOSPITAL_COMMUNITY): Payer: Self-pay | Admitting: Oncology

## 2013-03-16 DIAGNOSIS — Z09 Encounter for follow-up examination after completed treatment for conditions other than malignant neoplasm: Secondary | ICD-10-CM

## 2013-03-19 ENCOUNTER — Ambulatory Visit (HOSPITAL_COMMUNITY): Payer: 59

## 2013-04-06 ENCOUNTER — Ambulatory Visit (HOSPITAL_COMMUNITY)
Admission: RE | Admit: 2013-04-06 | Discharge: 2013-04-06 | Disposition: A | Discharge status: Home / Self Care | Payer: 59 | Source: Ambulatory Visit | Attending: Oncology | Admitting: Oncology

## 2013-04-06 ENCOUNTER — Encounter (HOSPITAL_COMMUNITY): Payer: 59 | Attending: Oncology

## 2013-04-06 ENCOUNTER — Ambulatory Visit (HOSPITAL_COMMUNITY): Payer: 59

## 2013-04-06 DIAGNOSIS — Z09 Encounter for follow-up examination after completed treatment for conditions other than malignant neoplasm: Secondary | ICD-10-CM

## 2013-04-06 DIAGNOSIS — E538 Deficiency of other specified B group vitamins: Secondary | ICD-10-CM | POA: Insufficient documentation

## 2013-04-06 DIAGNOSIS — Z1231 Encounter for screening mammogram for malignant neoplasm of breast: Secondary | ICD-10-CM | POA: Insufficient documentation

## 2013-04-06 MED ORDER — CYANOCOBALAMIN 1000 MCG/ML IJ SOLN
INTRAMUSCULAR | Status: AC
  Filled 2013-04-06: qty 1

## 2013-04-06 MED ORDER — CYANOCOBALAMIN 1000 MCG/ML IJ SOLN
1000.0000 ug | Freq: Once | INTRAMUSCULAR | Status: AC
  Administered 2013-04-06: 1000 ug via INTRAMUSCULAR

## 2013-04-06 NOTE — Progress Notes (Signed)
Nicole [name] presents today for injection per MD orders. B12 1000mcg administered IM in left Upper Arm. Administration without incident. Patient tolerated well.  

## 2013-04-09 ENCOUNTER — Ambulatory Visit (HOSPITAL_COMMUNITY): Payer: 59 | Admitting: Oncology

## 2013-04-19 ENCOUNTER — Other Ambulatory Visit: Payer: Self-pay | Admitting: Dermatology

## 2013-05-04 ENCOUNTER — Encounter (HOSPITAL_COMMUNITY): Payer: Self-pay | Admitting: Oncology

## 2013-05-04 ENCOUNTER — Encounter (HOSPITAL_COMMUNITY): Payer: 59 | Attending: Oncology

## 2013-05-04 ENCOUNTER — Encounter (HOSPITAL_BASED_OUTPATIENT_CLINIC_OR_DEPARTMENT_OTHER): Payer: 59

## 2013-05-04 ENCOUNTER — Encounter (HOSPITAL_BASED_OUTPATIENT_CLINIC_OR_DEPARTMENT_OTHER): Payer: 59 | Admitting: Oncology

## 2013-05-04 DIAGNOSIS — Z85038 Personal history of other malignant neoplasm of large intestine: Secondary | ICD-10-CM | POA: Insufficient documentation

## 2013-05-04 DIAGNOSIS — C8589 Other specified types of non-Hodgkin lymphoma, extranodal and solid organ sites: Secondary | ICD-10-CM

## 2013-05-04 DIAGNOSIS — E538 Deficiency of other specified B group vitamins: Secondary | ICD-10-CM

## 2013-05-04 DIAGNOSIS — Z1509 Genetic susceptibility to other malignant neoplasm: Secondary | ICD-10-CM | POA: Insufficient documentation

## 2013-05-04 DIAGNOSIS — C189 Malignant neoplasm of colon, unspecified: Secondary | ICD-10-CM | POA: Insufficient documentation

## 2013-05-04 DIAGNOSIS — C649 Malignant neoplasm of unspecified kidney, except renal pelvis: Secondary | ICD-10-CM

## 2013-05-04 DIAGNOSIS — R197 Diarrhea, unspecified: Secondary | ICD-10-CM | POA: Insufficient documentation

## 2013-05-04 DIAGNOSIS — C8590 Non-Hodgkin lymphoma, unspecified, unspecified site: Secondary | ICD-10-CM

## 2013-05-04 HISTORY — DX: Personal history of other malignant neoplasm of large intestine: Z85.038

## 2013-05-04 LAB — CBC WITH DIFFERENTIAL/PLATELET
BASOS PCT: 1 % (ref 0–1)
Basophils Absolute: 0.1 10*3/uL (ref 0.0–0.1)
EOS PCT: 3 % (ref 0–5)
Eosinophils Absolute: 0.3 10*3/uL (ref 0.0–0.7)
HEMATOCRIT: 40.1 % (ref 36.0–46.0)
Hemoglobin: 13.4 g/dL (ref 12.0–15.0)
Lymphocytes Relative: 25 % (ref 12–46)
Lymphs Abs: 2.3 10*3/uL (ref 0.7–4.0)
MCH: 30.5 pg (ref 26.0–34.0)
MCHC: 33.4 g/dL (ref 30.0–36.0)
MCV: 91.1 fL (ref 78.0–100.0)
MONO ABS: 1.1 10*3/uL — AB (ref 0.1–1.0)
Monocytes Relative: 12 % (ref 3–12)
Neutro Abs: 5.5 10*3/uL (ref 1.7–7.7)
Neutrophils Relative %: 59 % (ref 43–77)
Platelets: 238 10*3/uL (ref 150–400)
RBC: 4.4 MIL/uL (ref 3.87–5.11)
RDW: 13.1 % (ref 11.5–15.5)
WBC: 9.3 10*3/uL (ref 4.0–10.5)

## 2013-05-04 LAB — COMPREHENSIVE METABOLIC PANEL
ALBUMIN: 3.5 g/dL (ref 3.5–5.2)
ALT: 14 U/L (ref 0–35)
AST: 15 U/L (ref 0–37)
Alkaline Phosphatase: 86 U/L (ref 39–117)
BUN: 22 mg/dL (ref 6–23)
CO2: 25 mEq/L (ref 19–32)
CREATININE: 0.69 mg/dL (ref 0.50–1.10)
Calcium: 9.5 mg/dL (ref 8.4–10.5)
Chloride: 103 mEq/L (ref 96–112)
GFR calc Af Amer: >90 mL/min (ref >90)
GFR calc non Af Amer: >90 mL/min (ref >90)
Glucose, Bld: 86 mg/dL (ref 70–99)
Potassium: 4.2 mEq/L (ref 3.7–5.3)
Sodium: 141 mEq/L (ref 137–147)
Total Bilirubin: 0.2 mg/dL — ABNORMAL LOW (ref 0.3–1.2)
Total Protein: 7.3 g/dL (ref 6.0–8.3)

## 2013-05-04 LAB — CEA: CEA: 2 ng/mL (ref 0.0–5.0)

## 2013-05-04 MED ORDER — DIPHENOXYLATE-ATROPINE 2.5-0.025 MG PO TABS: 2.0000 | ORAL_TABLET | Freq: Three times a day (TID) | ORAL | Status: DC

## 2013-05-04 MED ORDER — CYANOCOBALAMIN 1000 MCG/ML IJ SOLN
INTRAMUSCULAR | Status: AC
  Filled 2013-05-04: qty 1

## 2013-05-04 MED ORDER — CYANOCOBALAMIN 1000 MCG/ML IJ SOLN
1000.0000 ug | Freq: Once | INTRAMUSCULAR | Status: AC
  Administered 2013-05-04: 1000 ug via INTRAMUSCULAR

## 2013-05-04 NOTE — Progress Notes (Signed)
Nicole [name] presents today for injection per MD orders. B12 1000mcg administered IM in left Upper Arm. Administration without incident. Patient tolerated well.  

## 2013-05-04 NOTE — Progress Notes (Signed)
Labs drawn today for cbc/diff,cmp,cea 

## 2013-05-04 NOTE — Patient Instructions (Signed)
.  [facility] Discharge Instructions  RECOMMENDATIONS MADE BY THE CONSULTANT AND ANY TEST RESULTS WILL BE SENT TO YOUR REFERRING PHYSICIAN.  EXAM FINDINGS BY THE PHYSICIAN TODAY AND SIGNS OR SYMPTOMS TO REPORT TO CLINIC OR PRIMARY PHYSICIAN: Exam and findings as discussed by T. Sheldon Silvan PA.  Continue same meds B12 monthly  Lomotil take 2 tabs 3 times a day before meals  INSTRUCTIONS/FOLLOW-UP:6 months labs and visit   Thank you for choosing [facility] to provide your oncology and hematology care.  To afford each patient quality time with our providers, please arrive at least 15 minutes before your scheduled appointment time.  With your help, our goal is to use those 15 minutes to complete the necessary work-up to ensure our physicians have the information they need to help with your evaluation and healthcare recommendations.    Effective January 1st, 2014, we ask that you re-schedule your appointment with our physicians should you arrive 10 or more minutes late for your appointment.  We strive to give you quality time with our providers, and arriving late affects you and other patients whose appointments are after yours.    Again, thank you for choosing [facility].  Our hope is that these requests will decrease the amount of time that you wait before being seen by our physicians.       _____________________________________________________________  Should you have questions after your visit to [facility], please contact our office at [phone] between the hours of 8:30 a.m. and 5:00 p.m.  Voicemails left after 4:30 p.m. will not be returned until the following business day.  For prescription refill requests, have your pharmacy contact our office with your prescription refill request.

## 2013-05-04 NOTE — Progress Notes (Signed)
[name], MD [place] [address]  Diarrhea - Plan: ibuprofen (ADVIL,MOTRIN) 200 MG tablet, diphenoxylate-atropine (LOMOTIL) 2.5-0.025 MG per tablet, DISCONTINUED: diphenoxylate-atropine (LOMOTIL) 2.5-0.025 MG per tablet  Lynch syndrome - Plan: ibuprofen (ADVIL,MOTRIN) 200 MG tablet, diphenoxylate-atropine (LOMOTIL) 2.5-0.025 MG per tablet, DISCONTINUED: diphenoxylate-atropine (LOMOTIL) 2.5-0.025 MG per tablet  History of colon cancer - Plan: ibuprofen (ADVIL,MOTRIN) 200 MG tablet, diphenoxylate-atropine (LOMOTIL) 2.5-0.025 MG per tablet, DISCONTINUED: diphenoxylate-atropine (LOMOTIL) 2.5-0.025 MG per tablet  Renal cell carcinoma  NHL (non-Hodgkin's lymphoma)  B12 deficiency  CURRENT THERAPY: Surveillance  INTERVAL HISTORY: KARREN NEWLAND 63 y.o. female returns for  regular  visit for followup of  Lynch Syndrome with genetic testing at [facility] on November 21, 2005 demonstrating MLH1 mutation [phone] conferring an 82% risk of colorectal cancer, 60% risk of endometrial cancer (by age 4), 13% risk of gastric cancer, 12 % risk of ovarian cancer (by age 51), and first degree relatives have a 1 in 2 chance of having this mutation. S/P hysterectomy with bilateral salpingo-oophorectomy in 1993 by Dr. Heide Spark. AND H/O Follicular-type NHL, well-differentiated with CD20 positivity, grade 1 found at the time of sigmoid colon resection in 2007 with 2 involved lymph nodes. AND Colon cancer x 3.  S/P subtotal colectomy  with ileorectal anastomosis by Dr. Lenise Arena at [facility] on 12/11/2005.  She will undergo flexible sigmoidoscopy annually by Dr. Laural Golden and we will follow CEAs AND Renal cancer, S/P left partial nephrectomy by Dr. Beatrix Fetters. AND Decreased son at young age from pancreatic cancer. (?Lynch Syndrome)  I have reviewed Saima's chart of late.  I personally reviewed and went over laboratory results with the patient.  The results are noted within this  dictation.  Labs were drawn today and pending.  I personally reviewed and went over radiographic studies with the patient.  The results are noted within this dictation.  Mammogram in Jan 2015 was negative and she will be due for her next screening mammogram in Jan 2016.   Additionally, she underwent a sigmoidoscopy by Dr. Laural Golden on 09/28/12 and this was normal.  This should be performed annually.   I personally reviewed and went over pathology results with the patient.  She had skin lesions removed and the pathology follows below.   She denies any complaints oncologically and hematologically and ROS questioning is negative.  She reports no B symptoms.  She denies any changes in her bowels and maintains loose stools requiring scheduled Lomotil.  She admits to increased GI flatulence and I recommended OTC GasX or Beano.    She does not an occasional RUQ discomfort when bending over.  This occurs infrequently and I suspect this is from increased air in colon due to recent increased flatulence as physical exam is negative.   She has shared her Lynch Syndrome with family and grandchildren on her one son's side have not yet been tested.   Past Medical History  Diagnosis Date  . B12 deficiency   . Right shoulder pain S/P ROTATOR CUFF REPAIR  . Chronic diarrhea SECONDARY HX COLON CANCER  . Personal history of renal cancer S/P LEFT PARTIAL NEPHRECTOMY 2007--  NO RECURRENCE  . History of colon cancer [facility] SYNDROME ---  COLORECTAL CANCER S/P COLECTOMY X3  LAST ONE 2007    NO RECURRENCE  . Lynch syndrome 10/17/2010    HX OF  . NHL (non-Hodgkin's lymphoma) 10/17/2010    MONITORED BY DR Tressie Stalker  . Arthritis     shorulder  . History  of kidney stones   . History of colon cancer 05/04/2013    has Lynch syndrome; Renal cell carcinoma; NHL (non-Hodgkin's lymphoma); Obesity; B12 deficiency; Rotator cuff tear, right; Pain in joint, shoulder region; Muscle weakness (generalized); and History of colon cancer  on her problem list.     is allergic to latex and morphine and related.  Ms. Colello had no medications administered during this visit.  Past Surgical History  Procedure Laterality Date  . Hemicolectomy  2001    right  . Hemicolectomy  2002    left  . Kidney resection  2007    left partial  . Subtotal colectomy  2007  . Flexible sigmoidoscopy  08/29/2011    Procedure: FLEXIBLE SIGMOIDOSCOPY;  Surgeon: Malissa Hippo, MD;  [facility];  Service: Endoscopy;  Laterality: N/A;  930  . Colonoscopy w/ polypectomy    . Left flank exploration w/  partial left nephrectomy/ left hilar lymph node bx  04-17-2005  DR DAHLSTEDT    RENAL CELL CARCINOMA  . Laparoscopic assisted vaginal hysterectomy  1993    W/ BILATERAL SALPINGO-OOPHORECTOMY  . Right rotator  cuff repair/   bicep repair  10-17-2011  DR SUPPLE  . Appendectomy  1993    W/ LAVH  . Cystoscopy with stent placement  02/07/2012    Procedure: CYSTOSCOPY WITH STENT PLACEMENT;  Surgeon: Marcine Matar, MD;  Location: [facility];  Service: Urology;  Laterality: Left;  . Lithotripsy  11-22013    x2  . Flexible sigmoidoscopy N/A 09/28/2012    Procedure: FLEXIBLE SIGMOIDOSCOPY;  Surgeon: Malissa Hippo, MD;  [facility];  Service: Endoscopy;  Laterality: N/A;  730    Denies any headaches, dizziness, double vision, fevers, chills, night sweats, nausea, vomiting, diarrhea, constipation, chest pain, heart palpitations, shortness of breath, blood in stool, black tarry stool, urinary pain, urinary burning, urinary frequency, hematuria.   PHYSICAL EXAMINATION  ECOG PERFORMANCE STATUS: 0 - Asymptomatic  There were no vitals filed for this visit.  GENERAL:alert, healthy, no distress, well nourished, well developed, comfortable, cooperative, obese and smiling SKIN: skin color, texture, turgor are normal, no rashes or significant lesions HEAD: Normocephalic, No masses, lesions, tenderness or  abnormalities EYES: normal, PERRLA, EOMI, Conjunctiva are pink and non-injected EARS: External ears normal OROPHARYNX:lips, buccal mucosa, and tongue normal and mucous membranes are moist  NECK: supple, no adenopathy, thyroid normal size, non-tender, without nodularity, no stridor, non-tender, trachea midline LYMPH:  no palpable lymphadenopathy, no hepatosplenomegaly BREAST:not examined LUNGS: clear to auscultation and percussion HEART: regular rate & rhythm, no murmurs, no gallops, S1 normal and S2 normal ABDOMEN:abdomen soft, non-tender, obese, normal bowel sounds, no masses or organomegaly and no hepatosplenomegaly BACK: Back symmetric, no curvature., No CVA tenderness EXTREMITIES:less then 2 second capillary refill, no joint deformities, effusion, or inflammation, no edema, no skin discoloration, no clubbing, no cyanosis  NEURO: alert & oriented x 3 with fluent speech, no focal motor/sensory deficits, gait normal    LABORATORY DATA: CBC    Component Value Date/Time   WBC 8.4 10/07/2011 1250   RBC 4.07 10/07/2011 1250   HGB 13.3 02/07/2012 1134   HCT 39.0 02/07/2012 1134   PLT 174 10/07/2011 1250   MCV 90.7 10/07/2011 1250   MCH 30.2 10/07/2011 1250   MCHC 33.3 10/07/2011 1250   RDW 13.0 10/07/2011 1250   LYMPHSABS 2.2 10/17/2010 1238   MONOABS 0.9 10/17/2010 1238   EOSABS 0.2 10/17/2010 1238   BASOSABS 0.1 10/17/2010 1238  Chemistry      Component Value Date/Time   NA 141 02/07/2012 1134   K 3.7 02/07/2012 1134   CL 107 02/07/2012 1134   CO2 25 10/07/2011 1250   BUN 19 02/07/2012 1134   CREATININE 0.80 02/07/2012 1134      Component Value Date/Time   CALCIUM 9.1 10/07/2011 1250   ALKPHOS 74 10/17/2010 1238   AST 15 10/17/2010 1238   ALT 17 10/17/2010 1238   BILITOT 0.3 10/17/2010 1238     Lab Results  Component Value Date   CEA 1.7 10/09/2011   Results for Imbert, HILDEGARD HLAVAC (MRN [id number]) as of 05/04/2013 13:01  Ref. Range 10/17/2010 12:38  LDH Latest Range: 94-250 U/L 149       RADIOGRAPHIC STUDIES:  04/07/2013  CLINICAL DATA: Screening.  EXAM:  DIGITAL SCREENING BILATERAL MAMMOGRAM WITH CAD  COMPARISON: Previous exam(s).  ACR Breast Density Category b: There are scattered areas of  fibroglandular density.  FINDINGS:  There are no findings suspicious for malignancy. Images were  processed with CAD.  IMPRESSION:  No mammographic evidence of malignancy. A result letter of this  screening mammogram will be mailed directly to the patient.  RECOMMENDATION:  Screening mammogram in one year. (Code:SM-B-01Y)  BI-RADS CATEGORY 1: Negative.  Electronically Signed  By: Lovey Newcomer M.D.  On: 04/07/2013 08:17    PATHOLOGY:  04/19/2013  Diagnosis 1. Skin , left temple, shave SEBORRHEIC KERATOSIS, INFLAMED 2. Skin , right temple, shave MELANOCYTIC NEVUS, INTRADERMAL TYPE 3. Skin , left upper / inner shin, shave HYPERTROPHIC ACTINIC KERATOSIS (DEEPER UNDERLYING INVASION NOT ENTIRELY EXCLUDED) 4. Skin , left lower outer shin, shave ACTINIC KERATOSIS Microscopic Description 1. There is benign hyperplasia of the epidermis with a basosquamous proliferation. A patchy mononuclear cell infiltrate is present around the vessels. There is hyperkeratosis but no appreciable atypia. This correlates with an inflamed seborrheic keratosis. 2. There are nests of banal nevus cells, predominantly in the dermis, which are smaller as they are deeper. There is no significant atypia. 3. There is moderate to marked epidermal atypia with prominent acanthosis. There is hyperkeratosis and parakeratosis. There is solar elastosis. The epidermal changes extend to the base. With involvement of the deep edge, deeper underlying invasive carcinoma cannot be entirely excluded. 4. There is a moderate degree of atypia of the squamous cells proliferating in the epidermis with focal parakeratosis and solar elastosis. This is actinic keratosis. ERICA RUSHING MD Dermatopathologist,  Electronic Signature (Case signed 04/20/2013)     ASSESSMENT:  1. 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 S/P hysterectomy with bilateral salpingo-oophorectomy in 1993 by Dr. Heide Spark. 2. H/O Follicular-type NHL, well-differentiated with CD20 positivity, grade 1 found at the time of sigmoid colon resection in 2007 with 2 involved lymph nodes. 3. Colon cancer x 3.  S/P subtotal colectomy  with ileorectal anastomosis by Dr. Lenise Arena at [facility] on 12/11/2005.  She will undergo flexible sigmoidoscopy annually by Dr. Laural Golden and we will follow CEAs 4. Renal cancer, S/P left partial nephrectomy by Dr. Beatrix Fetters. 5. Decreased son at young age from pancreatic cancer. (?Lynch Syndrome) 6. S/P Vaginal hysterectomy and bilateral salpingo-oophorectomy in 1993 by Dr. Heide Spark. 7. Vitamin B 12 deficiency requiring IM B  12 1000 mcg every 4 weeks.   Patient Active Problem List   Diagnosis Date Noted  . History of colon cancer 05/04/2013  . Pain in joint, shoulder region 10/28/2011  . Muscle weakness (generalized) 10/28/2011  . Rotator cuff tear, right 10/17/2011  . Lynch syndrome 10/17/2010  . Renal cell carcinoma 10/17/2010  . NHL (non-Hodgkin's lymphoma) 10/17/2010  . Obesity 10/17/2010  . B12 deficiency 10/17/2010    PLAN:  1. I personally reviewed and went over laboratory results with the patient.  The results are noted within this dictation. 2. I personally reviewed and went over radiographic studies with the patient.  The results are noted within this dictation.   3. I personally reviewed and went over pathology results with the patient. 4. Chart reviewed 5. Labs today: CBC diff, CMET, CEA 6. Annual  sigmoidoscopy by Dr. Shelva Majestic (last performed on 09/28/2012) 7. Rx for Lomotil with 11 refills.  8. Labs in 6 months: CBC diff, CMET, CEA, LDH, B2M, ESR 9. Consider CT abd/pelvis in 6 months with contrast 10. Next screening mammogram is due in Jan 2016 11. Continue Vitamin B 12 injections every 4 weeks.  To be given today (05/04/2013) 12. Return in 6 months for follow-up   THERAPY PLAN:  We will continue surveillance for her Lynch Syndrome which puts her as increased risk for a number of malignancies including colorectal cancer, gastric cancer, etc (endometrial and ovarian cancer risk is 0% secondary to total hysterectomy in 1993).  I think it is reasonable to perform CT abd/pelvis every 12-24 months.  She should undergo flexible sigmoidoscopy annually.  We will follow CEA every 6-12 months and markers for NHL.  Other cancer markers are poor screening tools and therefore will not be utilized unless otherwise indicated (clinically).  Additionally, we will continue to support her counts and maintain Vitamin B 12 level with every 4 week Vitamin B 12 injections of 1000 mcg.  All questions were answered. The patient knows to call the clinic with any problems, questions or concerns. We can certainly see the patient much sooner if necessary.  Patient and plan discussed with Dr. Farrel Gobble and he is in agreement with the aforementioned.   KEFALAS,THOMAS

## 2013-06-01 ENCOUNTER — Ambulatory Visit (HOSPITAL_COMMUNITY): Payer: 59

## 2013-06-03 ENCOUNTER — Encounter (HOSPITAL_COMMUNITY): Payer: 59 | Attending: Oncology

## 2013-06-03 VITALS — BP 146/91 | HR 99 | Resp 16

## 2013-06-03 DIAGNOSIS — E538 Deficiency of other specified B group vitamins: Secondary | ICD-10-CM | POA: Insufficient documentation

## 2013-06-03 MED ORDER — CYANOCOBALAMIN 1000 MCG/ML IJ SOLN
1000.0000 ug | Freq: Once | INTRAMUSCULAR | Status: AC
  Administered 2013-06-03: 1000 ug via INTRAMUSCULAR

## 2013-06-03 MED ORDER — CYANOCOBALAMIN 1000 MCG/ML IJ SOLN
INTRAMUSCULAR | Status: AC
  Filled 2013-06-03: qty 1

## 2013-06-03 NOTE — Progress Notes (Signed)
Nicole [name] presents today for injection per MD orders. B12 1000mcg administered IM in right Upper Arm. Administration without incident. Patient tolerated well.  

## 2013-07-08 ENCOUNTER — Encounter (HOSPITAL_COMMUNITY): Payer: 59 | Attending: Oncology

## 2013-07-08 VITALS — BP 146/74 | HR 91 | Resp 18

## 2013-07-08 DIAGNOSIS — E538 Deficiency of other specified B group vitamins: Secondary | ICD-10-CM

## 2013-07-08 MED ORDER — CYANOCOBALAMIN 1000 MCG/ML IJ SOLN
1000.0000 ug | Freq: Once | INTRAMUSCULAR | Status: AC
  Administered 2013-07-08: 1000 ug via INTRAMUSCULAR

## 2013-07-08 NOTE — Progress Notes (Signed)
Nicole [name] presents today for injection per MD orders. B12 1000mcg administered IM in left Upper Arm. Administration without incident. Patient tolerated well.  

## 2013-08-04 ENCOUNTER — Encounter (HOSPITAL_COMMUNITY): Payer: 59 | Attending: Oncology

## 2013-08-04 VITALS — BP 136/86 | HR 85 | Resp 18

## 2013-08-04 DIAGNOSIS — E538 Deficiency of other specified B group vitamins: Secondary | ICD-10-CM

## 2013-08-04 MED ORDER — CYANOCOBALAMIN 1000 MCG/ML IJ SOLN: 1000.0000 ug | Freq: Once | INTRAMUSCULAR | Status: DC

## 2013-08-04 MED ORDER — CYANOCOBALAMIN 1000 MCG/ML IJ SOLN
INTRAMUSCULAR | Status: AC
  Filled 2013-08-04: qty 1

## 2013-08-04 NOTE — Progress Notes (Signed)
Nicole [name]'s reason for visit today is for an injection and labs as scheduled per MD orders.Nicole [name] also received vitamin b12  per MD orders; see [facility] for administration details.  Nicole [name] tolerated all procedures well and without incident; questions were answered and patient was discharged.

## 2013-08-16 DIAGNOSIS — Z87440 Personal history of urinary (tract) infections: Secondary | ICD-10-CM

## 2013-08-16 DIAGNOSIS — Z8614 Personal history of Methicillin resistant Staphylococcus aureus infection: Secondary | ICD-10-CM

## 2013-08-16 HISTORY — DX: Personal history of urinary (tract) infections: Z87.440

## 2013-08-16 HISTORY — DX: Personal history of Methicillin resistant Staphylococcus aureus infection: Z86.14

## 2013-09-06 ENCOUNTER — Encounter (HOSPITAL_COMMUNITY): Payer: 59 | Attending: Oncology

## 2013-09-06 DIAGNOSIS — E538 Deficiency of other specified B group vitamins: Secondary | ICD-10-CM | POA: Insufficient documentation

## 2013-09-06 MED ORDER — CYANOCOBALAMIN 1000 MCG/ML IJ SOLN
1000.0000 ug | Freq: Once | INTRAMUSCULAR | Status: AC
  Administered 2013-09-06: 1000 ug via INTRAMUSCULAR

## 2013-09-06 MED ORDER — CYANOCOBALAMIN 1000 MCG/ML IJ SOLN
INTRAMUSCULAR | Status: AC
  Filled 2013-09-06: qty 1

## 2013-09-06 NOTE — Progress Notes (Signed)
Nicole [name] presents today for injection per MD orders. B12 1000mcg administered IM in right Upper Arm. Administration without incident. Patient tolerated well.  

## 2013-09-21 ENCOUNTER — Encounter (INDEPENDENT_AMBULATORY_CARE_PROVIDER_SITE_OTHER): Payer: Self-pay | Admitting: *Deleted

## 2013-10-06 ENCOUNTER — Encounter (HOSPITAL_COMMUNITY): Payer: 59 | Attending: Oncology

## 2013-10-06 ENCOUNTER — Other Ambulatory Visit (HOSPITAL_COMMUNITY): Payer: Self-pay | Admitting: Oncology

## 2013-10-06 DIAGNOSIS — E538 Deficiency of other specified B group vitamins: Secondary | ICD-10-CM

## 2013-10-06 MED ORDER — CYANOCOBALAMIN 1000 MCG/ML IJ SOLN
INTRAMUSCULAR | Status: AC
  Filled 2013-10-06: qty 1

## 2013-10-06 MED ORDER — CYANOCOBALAMIN 1000 MCG/ML IJ SOLN: 1000.0000 ug | Freq: Once | INTRAMUSCULAR | Status: DC

## 2013-10-06 MED ORDER — CYANOCOBALAMIN 1000 MCG/ML IJ SOLN
1000.0000 ug | Freq: Once | INTRAMUSCULAR | Status: AC
  Administered 2013-10-06: 1000 ug via INTRAMUSCULAR

## 2013-10-06 NOTE — Progress Notes (Signed)
Nicole [name] presents today for injection per MD orders. B12  administered IM  in right Upper Arm. Administration without incident. Patient tolerated well.

## 2013-10-23 ENCOUNTER — Emergency Department (HOSPITAL_COMMUNITY)
Admission: EM | Admit: 2013-10-23 | Discharge: 2013-10-23 | Disposition: A | Discharge status: Home / Self Care | Payer: BC Managed Care – PPO | Source: Home / Self Care | Attending: Emergency Medicine | Admitting: Emergency Medicine

## 2013-10-23 ENCOUNTER — Emergency Department (INDEPENDENT_AMBULATORY_CARE_PROVIDER_SITE_OTHER): Payer: BC Managed Care – PPO

## 2013-10-23 ENCOUNTER — Encounter (HOSPITAL_COMMUNITY): Payer: Self-pay | Admitting: Emergency Medicine

## 2013-10-23 DIAGNOSIS — N10 Acute tubulo-interstitial nephritis: Secondary | ICD-10-CM

## 2013-10-23 LAB — CBC WITH DIFFERENTIAL/PLATELET
BASOS PCT: 0 % (ref 0–1)
Basophils Absolute: 0 10*3/uL (ref 0.0–0.1)
Eosinophils Absolute: 0 10*3/uL (ref 0.0–0.7)
Eosinophils Relative: 0 % (ref 0–5)
HCT: 37.6 % (ref 36.0–46.0)
HEMOGLOBIN: 12.4 g/dL (ref 12.0–15.0)
LYMPHS PCT: 12 % (ref 12–46)
Lymphs Abs: 1.1 10*3/uL (ref 0.7–4.0)
MCH: 29.7 pg (ref 26.0–34.0)
MCHC: 33 g/dL (ref 30.0–36.0)
MCV: 90 fL (ref 78.0–100.0)
MONOS PCT: 11 % (ref 3–12)
Monocytes Absolute: 1.1 10*3/uL — ABNORMAL HIGH (ref 0.1–1.0)
NEUTROS ABS: 7.1 10*3/uL (ref 1.7–7.7)
Neutrophils Relative %: 77 % (ref 43–77)
Platelets: 199 10*3/uL (ref 150–400)
RBC: 4.18 MIL/uL (ref 3.87–5.11)
RDW: 13.5 % (ref 11.5–15.5)
WBC: 9.4 10*3/uL (ref 4.0–10.5)

## 2013-10-23 LAB — POCT URINALYSIS DIP (DEVICE)
BILIRUBIN URINE: NEGATIVE
GLUCOSE, UA: NEGATIVE mg/dL
KETONES UR: NEGATIVE mg/dL
NITRITE: POSITIVE — AB
PH: 5 (ref 5.0–8.0)
Protein, ur: 100 mg/dL — AB
Specific Gravity, Urine: 1.015 (ref 1.005–1.030)
Urobilinogen, UA: 0.2 mg/dL (ref 0.0–1.0)

## 2013-10-23 MED ORDER — ACETAMINOPHEN 325 MG PO TABS
650.0000 mg | ORAL_TABLET | Freq: Once | ORAL | Status: AC
  Administered 2013-10-23: 650 mg via ORAL

## 2013-10-23 MED ORDER — ACETAMINOPHEN 325 MG PO TABS
ORAL_TABLET | ORAL | Status: AC
  Filled 2013-10-23: qty 2

## 2013-10-23 MED ORDER — CEFTRIAXONE SODIUM 1 G IJ SOLR
INTRAMUSCULAR | Status: AC
  Filled 2013-10-23: qty 10

## 2013-10-23 MED ORDER — LIDOCAINE HCL (PF) 1 % IJ SOLN
INTRAMUSCULAR | Status: AC
  Filled 2013-10-23: qty 5

## 2013-10-23 MED ORDER — CEFTRIAXONE SODIUM 1 G IJ SOLR
1.0000 g | Freq: Once | INTRAMUSCULAR | Status: AC
  Administered 2013-10-23: 1 g via INTRAMUSCULAR

## 2013-10-23 MED ORDER — CEPHALEXIN 500 MG PO CAPS: 500.0000 mg | ORAL_CAPSULE | Freq: Three times a day (TID) | ORAL | Status: DC

## 2013-10-23 NOTE — ED Notes (Signed)
Updated on wait.  PO fluids given.  Comfort measures offered.

## 2013-10-23 NOTE — ED Notes (Signed)
C/O fever onset Thursday without any other sxs.  Fever got up to 102.2 this morning; last dose Tyl @ 0700.  Also has had lesion to left medial lower leg x approx 1 month.  Has been applying Neosporin and soaking in Epsom salts, but "it seems to have festered again".  Denies any drainage.  C/O HA and slight nausea; has not eaten yet today.  Also c/o cloudy urine.

## 2013-10-23 NOTE — Discharge Instructions (Signed)
Pyelonephritis, Adult °Pyelonephritis is a kidney infection. In general, there are 2 main types of pyelonephritis: °· Infections that come on quickly without any warning (acute pyelonephritis). °· Infections that persist for a long period of time (chronic pyelonephritis). °CAUSES  °Two main causes of pyelonephritis are: °· Bacteria traveling from the bladder to the kidney. This is a problem especially in pregnant women. The urine in the bladder can become filled with bacteria from multiple causes, including: °¨ Inflammation of the prostate gland (prostatitis). °¨ Sexual intercourse in females. °¨ Bladder infection (cystitis). °· Bacteria traveling from the bloodstream to the tissue part of the kidney. °Problems that may increase your risk of getting a kidney infection include: °· Diabetes. °· Kidney stones or bladder stones. °· Cancer. °· Catheters placed in the bladder. °· Other abnormalities of the kidney or ureter. °SYMPTOMS  °· Abdominal pain. °· Pain in the side or flank area. °· Fever. °· Chills. °· Upset stomach. °· Blood in the urine (dark urine). °· Frequent urination. °· Strong or persistent urge to urinate. °· Burning or stinging when urinating. °DIAGNOSIS  °Your caregiver may diagnose your kidney infection based on your symptoms. A urine sample may also be taken. °TREATMENT  °In general, treatment depends on how severe the infection is.  °· If the infection is mild and caught early, your caregiver may treat you with oral antibiotics and send you home. °· If the infection is more severe, the bacteria may have gotten into the bloodstream. This will require intravenous (IV) antibiotics and a hospital stay. Symptoms may include: °¨ High fever. °¨ Severe flank pain. °¨ Shaking chills. °· Even after a hospital stay, your caregiver may require you to be on oral antibiotics for a period of time. °· Other treatments may be required depending upon the cause of the infection. °HOME CARE INSTRUCTIONS  °· Take your  antibiotics as directed. Finish them even if you start to feel better. °· Make an appointment to have your urine checked to make sure the infection is gone. °· Drink enough fluids to keep your urine clear or pale yellow. °· Take medicines for the bladder if you have urgency and frequency of urination as directed by your caregiver. °SEEK IMMEDIATE MEDICAL CARE IF:  °· You have a fever or persistent symptoms for more than 2-3 days. °· You have a fever and your symptoms suddenly get worse. °· You are unable to take your antibiotics or fluids. °· You develop shaking chills. °· You experience extreme weakness or fainting. °· There is no improvement after 2 days of treatment. °MAKE SURE YOU: °· Understand these instructions. °· Will watch your condition. °· Will get help right away if you are not doing well or get worse. °Document Released: 03/04/2005 Document Revised: 09/03/2011 Document Reviewed: 08/08/2010 °ExitCare® Patient Information ©2015 [employer]. This information is not intended to replace advice given to you by your health care provider. Make sure you discuss any questions you have with your health care provider. ° °

## 2013-10-23 NOTE — ED Provider Notes (Addendum)
Chief Complaint   Chief Complaint  Patient presents with  . Fever    History of Present Illness   Nicole [name] is a 63 year old female who has had a three-day history of fever of up to 102. She had one day of fever 3 weeks ago but this seemed to go away on its own. She's had some headache and urine is been cloudy. She's had a nonproductive cough for several months. She has a small nodule on her left pretibial surface this present for about a month. If it not been draining any pus. There's been no swelling, erythema, or pain on the leg. She denies any nasal congestion, rhinorrhea, sore throat, stiff neck, or adenopathy. No chest pain or shortness of breath. No abdominal pain, nausea, vomiting, or diarrhea. She denies dysuria, frequency, urgency, hematuria, or GYN complaints. She has no skin rash or history of a tick bite. No travel, or animal exposure. He is a carrier and  Review of Systems   Other than as noted above, the patient denies any of the following symptoms. Systemic:  No sweats, fatigue, myalgias, headache, weight loss or anorexia. Eye:  No redness or drainage. ENT:  No earache, nasal congestion, rhinorrhea, sinus pressure, or sore throat. No adenopathy or stiff neck. Lungs:  No cough, sputum production, wheezing, shortness of breath.  Cardiovascular:  No chest pain. GI:  No nausea, vomiting, abdominal pain or diarrhea. GU:  No dysuria, frequency, or hematuria. Skin:  No rash.  [place]   Past medical history, family history, social history, meds, and allergies were reviewed. She is allergic to morphine and to tape. Her only medication is Lomotil. She had colon cancer which was operated on in 2001, 2002, and 2007. She also had non-Hodgkin's lymphoma.  Physical Examination     Vital signs:  BP 172/84  Pulse 107  Temp(Src) 100 F (37.8 C) (Oral)  Resp 20  Ht 5' (1.524 m)  Wt 194 lb (87.998 kg)  BMI 37.89 kg/m2  SpO2 97% General:  Alert, in no distress. Eye:  PERRL,  full EOMs.  Lids and conjunctivas were normal. ENT:  TMs and canals were normal, without erythema or inflammation.  Nasal mucosa was clear and uncongested, without drainage.  Mucous membranes were moist.  Pharynx was clear, without exudate or drainage.  There were no oral ulcerations or lesions. Neck:  Supple, no adenopathy, tenderness or mass. Thyroid was normal. Lungs:  No respiratory distress.  Lungs were clear to auscultation, without wheezes, rales or rhonchi.  Breath sounds were clear and equal bilaterally. Heart:  Regular rhythm, without gallops, murmers or rubs. Abdomen:  Soft, flat, and non-tender to palpation.  No hepatosplenomagaly or mass. Extremities:  No swelling, erythema, or joint pain to palpation. Skin:  Clear, warm, and dry, without rash or lesions. There is a small, raised, red nodule on the left pretibial surface. This was not ulcerated not draining any pus. There was no surrounding erythema, induration, or pain to palpation.  Labs   Results for orders placed during the hospital encounter of 10/23/13  CBC WITH DIFFERENTIAL      Result Value Ref Range   WBC 9.4  4.0 - 10.5 K/uL   RBC 4.18  3.87 - 5.11 MIL/uL   Hemoglobin 12.4  12.0 - 15.0 g/dL   HCT 37.6  36.0 - 46.0 %   MCV 90.0  78.0 - 100.0 fL   MCH 29.7  26.0 - 34.0 pg   MCHC 33.0  30.0 - 36.0 g/dL  RDW 13.5  11.5 - 15.5 %   Platelets 199  150 - 400 K/uL   Neutrophils Relative % 77  43 - 77 %   Neutro Abs 7.1  1.7 - 7.7 K/uL   Lymphocytes Relative 12  12 - 46 %   Lymphs Abs 1.1  0.7 - 4.0 K/uL   Monocytes Relative 11  3 - 12 %   Monocytes Absolute 1.1 (*) 0.1 - 1.0 K/uL   Eosinophils Relative 0  0 - 5 %   Eosinophils Absolute 0.0  0.0 - 0.7 K/uL   Basophils Relative 0  0 - 1 %   Basophils Absolute 0.0  0.0 - 0.1 K/uL  POCT URINALYSIS DIP (DEVICE)      Result Value Ref Range   Glucose, UA NEGATIVE  NEGATIVE mg/dL   Bilirubin Urine NEGATIVE  NEGATIVE   Ketones, ur NEGATIVE  NEGATIVE mg/dL   Specific  Gravity, Urine 1.015  1.005 - 1.030   Hgb urine dipstick LARGE (*) NEGATIVE   pH 5.0  5.0 - 8.0   Protein, ur 100 (*) NEGATIVE mg/dL   Urobilinogen, UA 0.2  0.0 - 1.0 mg/dL   Nitrite POSITIVE (*) NEGATIVE   Leukocytes, UA MODERATE (*) NEGATIVE     Radiology   Dg Chest 2 View  10/23/2013   CLINICAL DATA:  Intermittent cough and mucus.  Fever for 3 days.  EXAM: CHEST  2 VIEW  COMPARISON:  10/07/2011  FINDINGS: Heart is normal in size. Aorta is mildly tortuous. There is minimal left lower lobe scar or atelectasis. No focal consolidations or pleural effusions are identified. Visualized osseous structures have a normal appearance.  IMPRESSION: No evidence for acute  abnormality.   Electronically Signed   By: Shon Hale M.D.   On: 10/23/2013 16:01    Course in [facility]   Given Rocephin 1 g IM.  Assessment   The encounter diagnosis was Acute pyelonephritis.  Plan   1.  Meds:  The following meds were prescribed:   Discharge Medication List as of 10/23/2013  4:58 PM    START taking these medications   Details  cephALEXin (KEFLEX) 500 MG capsule Take 1 capsule (500 mg total) by mouth 3 (three) times daily., Starting 10/23/2013, Until Discontinued, Normal        2.  Patient Education/Counseling:  The patient was given appropriate handouts, self care instructions, and instructed in symptomatic relief.  May use Tylenol or ibuprofen for fever.   3.  Follow up:  The patient was told to follow up here if no better in 2 to 3 days, or sooner if becoming worse in any way, and given some red flag symptoms such as increasing fever, severe headache or stiff neck, difficulty breathing, chest pain, abdominal pain, or persistent vomiting which would prompt immediate return.  Followup with her primary care physician next week.     Harden Mo, MD 10/23/13 2118   Addendum to note: The lab called [place] today and stated that the urine culture which was obtained had been left out too long at  the hospital and thus they were unable to perform a culture. I called the patient back, she states she's feeling a lot better. He has no further fever. She's taking her antibiotics. She plans to see her primary care physician Wednesday and she'll ask him to get a culture on that date. I did not think she needs to come back here and repeat culture.  Harden Mo, MD  10/25/13 2139 

## 2013-10-23 NOTE — ED Notes (Signed)
S/S allergic reaction reviewed w/ pt. 

## 2013-10-26 LAB — WOUND CULTURE

## 2013-10-26 NOTE — ED Notes (Signed)
Wound Culture: Abundant MRSA.  Message sent to Dr. Jake Michaelis. Nicole [name] 10/26/2013

## 2013-10-28 ENCOUNTER — Telehealth (HOSPITAL_COMMUNITY): Payer: Self-pay | Admitting: Emergency Medicine

## 2013-10-28 ENCOUNTER — Telehealth (HOSPITAL_COMMUNITY): Payer: Self-pay | Admitting: *Deleted

## 2013-10-28 MED ORDER — SULFAMETHOXAZOLE-TMP DS 800-160 MG PO TABS: 2.0000 | ORAL_TABLET | Freq: Two times a day (BID) | ORAL | Status: DC

## 2013-10-28 NOTE — ED Notes (Addendum)
The wound culture from the lesion on the left lower leg came back showing MRSA. She was treated with Keflex. She'll need to continue the Keflex for the possibility of urinary tract infection. Will add Septra DS #42 twice a day for 10 days. Please call and inform patient. The prescription will be sent to [facility] drug store in [place] at [address] and [employer].  Harden Mo, MD 10/28/13 [id number]  Harden Mo, MD 10/28/13 [phone]

## 2013-10-28 NOTE — ED Notes (Signed)
Dr. Jake Michaelis wrote back to continue the Keflex for the UTI and  to add Septra DS 2 BID x 10 days for the MRSA.  I called pt.  Pt. verified x 2 and given results.  Pt. said she went to see her doctor yesterday (Dr. Karie Kirks) and he gave her  Septra 1 BID for 10 days and told to finish the Keflex.  I told her I would check with our doctor.  Discussed with Dr. Juventino Slovak and he said OK to go with Dr. Vickey Sages plan.  I reviewed the [facility] MRSA instructions with her. Roselyn Meier 10/28/2013

## 2013-10-31 NOTE — Progress Notes (Signed)
-  Rescheduled-  Marqual Mi  

## 2013-11-02 ENCOUNTER — Ambulatory Visit (HOSPITAL_COMMUNITY): Payer: 59 | Admitting: Oncology

## 2013-11-02 ENCOUNTER — Other Ambulatory Visit (HOSPITAL_COMMUNITY): Payer: 59

## 2014-02-04 ENCOUNTER — Other Ambulatory Visit: Payer: Self-pay | Admitting: Urology

## 2014-02-04 DIAGNOSIS — N200 Calculus of kidney: Secondary | ICD-10-CM

## 2014-02-22 ENCOUNTER — Ambulatory Visit (HOSPITAL_COMMUNITY)
Admission: RE | Admit: 2014-02-22 | Discharge: 2014-02-22 | Disposition: A | Discharge status: Home / Self Care | Payer: BC Managed Care – PPO | Source: Ambulatory Visit | Attending: Urology | Admitting: Urology

## 2014-02-22 DIAGNOSIS — N200 Calculus of kidney: Secondary | ICD-10-CM | POA: Diagnosis present

## 2014-03-01 ENCOUNTER — Ambulatory Visit (INDEPENDENT_AMBULATORY_CARE_PROVIDER_SITE_OTHER): Payer: BC Managed Care – PPO | Admitting: Urology

## 2014-03-01 DIAGNOSIS — N200 Calculus of kidney: Secondary | ICD-10-CM

## 2014-03-18 NOTE — Assessment & Plan Note (Addendum)
S/P left partial nephrectomy by Dr. Diona Fanti.  S/P US renal on 02/22/2014 by Dr. Teresa Pelton that is negative for any evidence of malignancy.  CT abd/pelvis in Feb 2016 for annual surveillance. NED.

## 2014-03-18 NOTE — Assessment & Plan Note (Addendum)
Well-differentiated with CD20 positivity, grade 1 found at the time of sigmoid colon resection in 2007 with 2 involved lymph nodes.  NED.  Labs today: CBC diff, CMET, LDH, B2M, ESR. CT abd/pelvis in Feb 2016 for annual surveillance.

## 2014-03-18 NOTE — Assessment & Plan Note (Addendum)
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 S/P hysterectomy with bilateral salpingo-oophorectomy in 1993 by Dr. Heide Spark. CT abd/pelvis in Feb 2016 for annual surveillance.  Return in 6 months for follow-up

## 2014-03-18 NOTE — Assessment & Plan Note (Signed)
Colon cancer x 3.  S/P subtotal colectomy by Dr. Morton Stall at [facility] on 12/11/2005.  She will undergo flexible sigmoidoscopy annually by Dr. Laural Golden, last performed in July 2014.  Labs today: CEA.

## 2014-03-18 NOTE — Assessment & Plan Note (Addendum)
Negative anti-parietal cell or intrinsic factor antibody testing.  O B12 injections every 4 weeks, taking at home. Labs today: Vit B12, MMA, and homocysteine.  (I accidentally ordered an MMA, urine random test instead of the serum test.  Since the patient is gone today, and I noticed this error after the fact, I have called the lab to cancel this order and I spoke with Wells Guiles in the lab to cancel this).

## 2014-03-18 NOTE — Progress Notes (Signed)
Nicole Bellow, MD [place] [address]  Lynch syndrome  Renal cell carcinoma, left  NHL (non-Hodgkin's lymphoma)  History of colon cancer  B12 deficiency - Plan: Vitamin B12, Methylmalonic acid(mma), rnd urine, Homocysteine, serum, Methylmalonic acid(mma), rnd urine  CURRENT THERAPY: Surveillance  INTERVAL HISTORY: Nicole [name] 64 y.o. female returns for followup of Lynch Syndrome with genetic testing at [facility] on November 21, 2005 demonstrating MLH1 mutation [id number] conferring an 82% risk of colorectal cancer, 60% risk of endometrial cancer (by age 55), 13% risk of gastric cancer, 12 % risk of ovarian cancer (by age 32), and first degree relatives have a 1 in 2 chance of having this mutation. S/P hysterectomy with bilateral salpingo-oophorectomy in 1993 by Nicole [name]. AND H/O Follicular-type NHL, well-differentiated with CD20 positivity, grade 1 found at the time of sigmoid colon resection in 2007 with 2 involved lymph nodes. AND Colon cancer x 3. S/P subtotal colectomy with ileorectal anastomosis by Nicole [name] at [facility] on 12/11/2005. She will undergo flexible sigmoidoscopy annually by Nicole [name] and we will follow CEAs AND Renal cancer, S/P left partial nephrectomy by Nicole [name]. AND Decreased son at young age from pancreatic cancer. (?Lynch Syndrome)  Chart reviewed.   I personally reviewed and went over laboratory results with the patient.  The results are noted within this dictation.  She underwent a sigmoidoscopy by Nicole [name] on 09/28/12 and this was normal. This should be performed annually and she did not have this done in 2015.  I have requested a referral to Nicole [name] for this surveillance study.  She retired in May 2015 and she notes that she is doing well since retiring.    She has seen Nicole [name] recently who performed a renal US.  From an oncology standpoint, that exam was negative, but there are renal calculi  noted.  These will be addressed she notes.   Oncologically and hematologically, she denies any complaints including B symptoms.  ROS questioning is negative.  Past Medical History  Diagnosis Date  . B12 deficiency   . Right shoulder pain S/P ROTATOR CUFF REPAIR  . Chronic diarrhea SECONDARY HX COLON CANCER  . Personal history of renal cancer S/P LEFT PARTIAL NEPHRECTOMY 2007--  NO RECURRENCE  . History of colon cancer [facility] SYNDROME ---  COLORECTAL CANCER S/P COLECTOMY X3  LAST ONE 2007    NO RECURRENCE  . Lynch syndrome 10/17/2010    HX OF  . NHL (non-Hodgkin's lymphoma) 10/17/2010    MONITORED BY DR Nicole [name]  . Arthritis     shorulder  . History of kidney stones   . History of colon cancer 05/04/2013  . History of MRSA infection 08/2013    left leg  . History of kidney infection 08/2013    has Lynch syndrome; Renal cell carcinoma; NHL (non-Hodgkin's lymphoma); Obesity; B12 deficiency; Rotator cuff tear, right; Pain in joint, shoulder region; Muscle weakness (generalized); and History of colon cancer on her problem list.     is allergic to other; latex; and morphine and related.  Nicole [name] had no medications administered during this visit.  Past Surgical History  Procedure Laterality Date  . Hemicolectomy  2001    right  . Hemicolectomy  2002    left  . Kidney resection  2007    left partial  . Subtotal colectomy  2007  . Flexible sigmoidoscopy  08/29/2011    Procedure: FLEXIBLE SIGMOIDOSCOPY;  Surgeon: Nicole Houston,  MD;  [facility];  Service: Endoscopy;  Laterality: N/A;  930  . Colonoscopy w/ polypectomy    . Left flank exploration w/  partial left nephrectomy/ left hilar lymph node bx  04-17-2005  DR [name]    RENAL CELL CARCINOMA  . Laparoscopic assisted vaginal hysterectomy  1993    W/ BILATERAL SALPINGO-OOPHORECTOMY  . Right rotator  cuff repair/   bicep repair  10-17-2011  DR SUPPLE  . Appendectomy  1993    W/ LAVH  . Cystoscopy with stent  placement  02/07/2012    Procedure: CYSTOSCOPY WITH STENT PLACEMENT;  Surgeon: Franchot Gallo, MD;  Location: [facility];  Service: Urology;  Laterality: Left;  . Lithotripsy  11-22013    x2  . Flexible sigmoidoscopy N/A 09/28/2012    Procedure: FLEXIBLE SIGMOIDOSCOPY;  Surgeon: Nicole Houston, MD;  Location: [facility];  Service: Endoscopy;  Laterality: N/A;  730    Denies any headaches, dizziness, double vision, fevers, chills, night sweats, nausea, vomiting, diarrhea, constipation, chest pain, heart palpitations, shortness of breath, blood in stool, black tarry stool, urinary pain, urinary burning, urinary frequency, hematuria.   PHYSICAL EXAMINATION  ECOG PERFORMANCE STATUS: 0 - Asymptomatic  Filed Vitals:   03/21/14 1000  BP: 158/95  Pulse: 97  Temp: 98.9 F (37.2 C)  Resp: 16    GENERAL:alert, no distress, well nourished, well developed, comfortable, cooperative, obese and smiling SKIN: skin color, texture, turgor are normal, no rashes or significant lesions HEAD: Normocephalic, No masses, lesions, tenderness or abnormalities EYES: normal, PERRLA, EOMI, Conjunctiva are pink and non-injected EARS: External ears normal OROPHARYNX:lips, buccal mucosa, and tongue normal and mucous membranes are moist  NECK: supple, no adenopathy, thyroid normal size, non-tender, without nodularity, no stridor, non-tender, trachea midline LYMPH:  no palpable lymphadenopathy, no hepatosplenomegaly BREAST:not examined LUNGS: clear to auscultation and percussion HEART: regular rate & rhythm, no murmurs, no gallops, S1 normal and S2 normal ABDOMEN:abdomen soft, non-tender, obese, normal bowel sounds, no masses or organomegaly and no hepatosplenomegaly BACK: Back symmetric, no curvature., No CVA tenderness EXTREMITIES:less then 2 second capillary refill, no joint deformities, effusion, or inflammation, no edema, no skin discoloration, no clubbing, no cyanosis  NEURO: alert &  oriented x 3 with fluent speech, no focal motor/sensory deficits, gait normal   LABORATORY DATA: CBC    Component Value Date/Time   WBC 8.6 03/21/2014 1107   RBC 4.40 03/21/2014 1107   HGB 13.2 03/21/2014 1107   HCT 40.2 03/21/2014 1107   PLT 213 03/21/2014 1107   MCV 91.4 03/21/2014 1107   MCH 30.0 03/21/2014 1107   MCHC 32.8 03/21/2014 1107   RDW 13.0 03/21/2014 1107   LYMPHSABS 1.6 03/21/2014 1107   MONOABS 0.8 03/21/2014 1107   EOSABS 0.2 03/21/2014 1107   BASOSABS 0.1 03/21/2014 1107      Chemistry      Component Value Date/Time   NA 137 03/21/2014 1107   K 3.8 03/21/2014 1107   CL 106 03/21/2014 1107   CO2 23 03/21/2014 1107   BUN 20 03/21/2014 1107   CREATININE 0.96 03/21/2014 1107      Component Value Date/Time   CALCIUM 8.9 03/21/2014 1107   ALKPHOS 71 03/21/2014 1107   AST 17 03/21/2014 1107   ALT 20 03/21/2014 1107   BILITOT 0.5 03/21/2014 1107       RADIOGRAPHIC STUDIES:  US Renal  02/22/2014   CLINICAL DATA:  Followup renal calculi. History of a partial nephrectomy. History of  lithotripsy as well as renal cell carcinoma and colon carcinoma.  EXAM: RENAL/URINARY TRACT ULTRASOUND COMPLETE  COMPARISON:  02/25/2013.  FINDINGS: Right Kidney:  Length: 13.1 cm. Echogenicity within normal limits. No mass or hydronephrosis visualized.  Left Kidney:  Length: 10.6 cm. Midpole scarring. Three discrete collecting system calcifications, a 12 mm upper pole calcification, 11 mm midpole calcification and 11 mm lower pole calcification. No hydronephrosis. No renal mass.  Bladder:  Unremarkable. Bilateral ureteral jets noted. Prevoid volume 132 mL. Postvoid volume 13 mL.  IMPRESSION: 1. 3 discrete left intrarenal stones are noted, but there is no left hydronephrosis. No right intrarenal stones. No renal masses.   Electronically Signed   By: Lajean Manes M.D.   On: 02/22/2014 09:17      ASSESSMENT AND PLAN:  Lynch syndrome Genetic testing at [facility] on November 21, 2005  demonstrating MLH1 mutation [id number] conferring an 82% risk of colorectal cancer, 60% risk of endometrial cancer (by age 68), 13% risk of gastric cancer, 12 % risk of ovarian cancer (by age 89), and first degree relatives have a 1 in 2 chance of having this mutation. S/P hysterectomy with bilateral salpingo-oophorectomy in 1993 by Nicole [name]. CT abd/pelvis in Feb 2016 for annual surveillance.  Return in 6 months for follow-up  Renal cell carcinoma S/P left partial nephrectomy by Dr. Diona Fanti.  S/P US renal on 02/22/2014 by Dr. Teresa Pelton that is negative for any evidence of malignancy.  CT abd/pelvis in Feb 2016 for annual surveillance. NED.  NHL (non-Hodgkin's lymphoma) Well-differentiated with CD20 positivity, grade 1 found at the time of sigmoid colon resection in 2007 with 2 involved lymph nodes.  NED.  Labs today: CBC diff, CMET, LDH, B2M, ESR. CT abd/pelvis in Feb 2016 for annual surveillance.   History of colon cancer Colon cancer x 3.  S/P subtotal colectomy by Dr. Morton Stall at [facility] on 12/11/2005.  She will undergo flexible sigmoidoscopy annually by Nicole [name], last performed in July 2014.  Labs today: CEA.     B12 deficiency Negative anti-parietal cell or intrinsic factor antibody testing.  O B12 injections every 4 weeks, taking at home. Labs today: Vit B12, MMA, and homocysteine.  (I accidentally ordered an MMA, urine random test instead of the serum test.  Since the patient is gone today, and I noticed this error after the fact, I have called the lab to cancel this order and I spoke with Wells Guiles in the lab to cancel this).     THERAPY PLAN:  Will continue surveillance given her Lynch Syndrome with labs every 6 months and annual CT abd/pelvis imaging.  All questions were answered. The patient knows to call the clinic with any problems, questions or concerns. We can certainly see the patient much sooner if necessary.  Patient and plan discussed with Dr. Ancil Linsey and she is in  agreement with the aforementioned.   [place] Mullett 03/21/2014

## 2014-03-21 ENCOUNTER — Encounter (HOSPITAL_COMMUNITY): Payer: Self-pay | Admitting: Oncology

## 2014-03-21 ENCOUNTER — Encounter (HOSPITAL_BASED_OUTPATIENT_CLINIC_OR_DEPARTMENT_OTHER): Payer: 59

## 2014-03-21 ENCOUNTER — Encounter (HOSPITAL_COMMUNITY): Payer: 59 | Attending: Oncology | Admitting: Oncology

## 2014-03-21 VITALS — BP 158/95 | HR 97 | Temp 98.9°F | Resp 16 | Wt 189.0 lb

## 2014-03-21 DIAGNOSIS — Z1509 Genetic susceptibility to other malignant neoplasm: Secondary | ICD-10-CM | POA: Insufficient documentation

## 2014-03-21 DIAGNOSIS — C642 Malignant neoplasm of left kidney, except renal pelvis: Secondary | ICD-10-CM

## 2014-03-21 DIAGNOSIS — C649 Malignant neoplasm of unspecified kidney, except renal pelvis: Secondary | ICD-10-CM | POA: Insufficient documentation

## 2014-03-21 DIAGNOSIS — C8590 Non-Hodgkin lymphoma, unspecified, unspecified site: Secondary | ICD-10-CM

## 2014-03-21 DIAGNOSIS — Z85038 Personal history of other malignant neoplasm of large intestine: Secondary | ICD-10-CM

## 2014-03-21 DIAGNOSIS — E538 Deficiency of other specified B group vitamins: Secondary | ICD-10-CM | POA: Diagnosis not present

## 2014-03-21 DIAGNOSIS — Z15060 Genetic susceptibility to colorectal cancer: Secondary | ICD-10-CM

## 2014-03-21 LAB — VITAMIN B12: Vitamin B-12: 767 pg/mL (ref 211–911)

## 2014-03-21 LAB — COMPREHENSIVE METABOLIC PANEL
ALT: 20 U/L (ref 0–35)
ANION GAP: 8 (ref 5–15)
AST: 17 U/L (ref 0–37)
Albumin: 3.9 g/dL (ref 3.5–5.2)
Alkaline Phosphatase: 71 U/L (ref 39–117)
BILIRUBIN TOTAL: 0.5 mg/dL (ref 0.3–1.2)
BUN: 20 mg/dL (ref 6–23)
CALCIUM: 8.9 mg/dL (ref 8.4–10.5)
CO2: 23 mmol/L (ref 19–32)
CREATININE: 0.96 mg/dL (ref 0.50–1.10)
Chloride: 106 mEq/L (ref 96–112)
GFR calc Af Amer: 71 mL/min — ABNORMAL LOW (ref >90)
GFR calc non Af Amer: 61 mL/min — ABNORMAL LOW (ref >90)
Glucose, Bld: 96 mg/dL (ref 70–99)
Potassium: 3.8 mmol/L (ref 3.5–5.1)
Sodium: 137 mmol/L (ref 135–145)
Total Protein: 7.1 g/dL (ref 6.0–8.3)

## 2014-03-21 LAB — CBC WITH DIFFERENTIAL/PLATELET
BASOS PCT: 1 % (ref 0–1)
Basophils Absolute: 0.1 10*3/uL (ref 0.0–0.1)
EOS ABS: 0.2 10*3/uL (ref 0.0–0.7)
EOS PCT: 2 % (ref 0–5)
HCT: 40.2 % (ref 36.0–46.0)
Hemoglobin: 13.2 g/dL (ref 12.0–15.0)
LYMPHS ABS: 1.6 10*3/uL (ref 0.7–4.0)
Lymphocytes Relative: 18 % (ref 12–46)
MCH: 30 pg (ref 26.0–34.0)
MCHC: 32.8 g/dL (ref 30.0–36.0)
MCV: 91.4 fL (ref 78.0–100.0)
Monocytes Absolute: 0.8 10*3/uL (ref 0.1–1.0)
Monocytes Relative: 9 % (ref 3–12)
NEUTROS PCT: 70 % (ref 43–77)
Neutro Abs: 6 10*3/uL (ref 1.7–7.7)
Platelets: 213 10*3/uL (ref 150–400)
RBC: 4.4 MIL/uL (ref 3.87–5.11)
RDW: 13 % (ref 11.5–15.5)
WBC: 8.6 10*3/uL (ref 4.0–10.5)

## 2014-03-21 LAB — SEDIMENTATION RATE: SED RATE: 12 mm/h (ref 0–22)

## 2014-03-21 LAB — LACTATE DEHYDROGENASE: LDH: 135 U/L (ref 94–250)

## 2014-03-21 NOTE — Progress Notes (Signed)
Labs for hmcy,ldh,cbcd,mma,esr,b12,cea,b43m,cmp

## 2014-03-21 NOTE — Patient Instructions (Signed)
[place] Discharge Instructions  RECOMMENDATIONS MADE BY THE CONSULTANT AND ANY TEST RESULTS WILL BE SENT TO YOUR REFERRING PHYSICIAN.  Labs are being performed today. Continue B12 injections every month at home. Mammogram is due this month, Jan 2016. Next CT scan of abd/pelvis with contrast is due in Feb 2016. Return in 6 months for follow-up.  Happy New Year  Thank you for choosing [facility] to provide your oncology and hematology care.  To afford each patient quality time with our providers, please arrive at least 15 minutes before your scheduled appointment time.  With your help, our goal is to use those 15 minutes to complete the necessary work-up to ensure our physicians have the information they need to help with your evaluation and healthcare recommendations.    Effective January 1st, 2014, we ask that you re-schedule your appointment with our physicians should you arrive 10 or more minutes late for your appointment.  We strive to give you quality time with our providers, and arriving late affects you and other patients whose appointments are after yours.    Again, thank you for choosing [facility].  Our hope is that these requests will decrease the amount of time that you wait before being seen by our physicians.       _____________________________________________________________  Should you have questions after your visit to [facility], please contact our office at [phone] between the hours of 8:30 a.m. and 5:00 p.m.  Voicemails left after 4:30 p.m. will not be returned until the following business day.  For prescription refill requests, have your pharmacy contact our office with your prescription refill request.

## 2014-03-22 LAB — HOMOCYSTEINE: Homocysteine: 8.2 umol/L (ref 4.0–15.4)

## 2014-03-22 LAB — CEA: CEA: 2.6 ng/mL (ref 0.0–4.7)

## 2014-03-23 LAB — BETA 2 MICROGLOBULIN, SERUM: Beta-2 Microglobulin: 2.64 mg/L — ABNORMAL HIGH (ref <=2.51)

## 2014-03-29 ENCOUNTER — Other Ambulatory Visit: Payer: Self-pay | Admitting: Urology

## 2014-03-29 DIAGNOSIS — N200 Calculus of kidney: Secondary | ICD-10-CM

## 2014-03-31 ENCOUNTER — Encounter (HOSPITAL_COMMUNITY): Payer: Self-pay | Admitting: Orthopedic Surgery

## 2014-04-04 ENCOUNTER — Ambulatory Visit (HOSPITAL_COMMUNITY)
Admission: RE | Admit: 2014-04-04 | Discharge: 2014-04-04 | Disposition: A | Discharge status: Home / Self Care | Payer: 59 | Source: Ambulatory Visit | Attending: Urology | Admitting: Urology

## 2014-04-04 DIAGNOSIS — N132 Hydronephrosis with renal and ureteral calculous obstruction: Secondary | ICD-10-CM | POA: Diagnosis not present

## 2014-04-04 DIAGNOSIS — K8020 Calculus of gallbladder without cholecystitis without obstruction: Secondary | ICD-10-CM | POA: Insufficient documentation

## 2014-04-04 DIAGNOSIS — K760 Fatty (change of) liver, not elsewhere classified: Secondary | ICD-10-CM | POA: Diagnosis not present

## 2014-04-04 DIAGNOSIS — Z87440 Personal history of urinary (tract) infections: Secondary | ICD-10-CM | POA: Diagnosis not present

## 2014-04-04 DIAGNOSIS — N200 Calculus of kidney: Secondary | ICD-10-CM

## 2014-04-04 DIAGNOSIS — R109 Unspecified abdominal pain: Secondary | ICD-10-CM | POA: Diagnosis present

## 2014-04-18 ENCOUNTER — Ambulatory Visit (HOSPITAL_COMMUNITY): Payer: BC Managed Care – PPO

## 2014-04-19 ENCOUNTER — Ambulatory Visit (HOSPITAL_COMMUNITY): Payer: 59

## 2014-04-27 ENCOUNTER — Other Ambulatory Visit: Payer: Self-pay | Admitting: Urology

## 2014-04-27 DIAGNOSIS — N200 Calculus of kidney: Secondary | ICD-10-CM

## 2014-05-05 ENCOUNTER — Other Ambulatory Visit (HOSPITAL_COMMUNITY): Payer: Self-pay | Admitting: Family Medicine

## 2014-05-05 DIAGNOSIS — Z1231 Encounter for screening mammogram for malignant neoplasm of breast: Secondary | ICD-10-CM

## 2014-05-09 ENCOUNTER — Ambulatory Visit (HOSPITAL_COMMUNITY)
Admission: RE | Admit: 2014-05-09 | Discharge: 2014-05-09 | Disposition: A | Discharge status: Home / Self Care | Payer: 59 | Source: Ambulatory Visit | Attending: Family Medicine | Admitting: Family Medicine

## 2014-05-09 DIAGNOSIS — Z1231 Encounter for screening mammogram for malignant neoplasm of breast: Secondary | ICD-10-CM | POA: Diagnosis not present

## 2014-05-11 ENCOUNTER — Ambulatory Visit (HOSPITAL_COMMUNITY): Payer: 59

## 2014-05-16 ENCOUNTER — Ambulatory Visit (HOSPITAL_COMMUNITY): Payer: BC Managed Care – PPO

## 2014-05-17 ENCOUNTER — Ambulatory Visit (INDEPENDENT_AMBULATORY_CARE_PROVIDER_SITE_OTHER): Payer: 59 | Admitting: Urology

## 2014-05-17 DIAGNOSIS — N200 Calculus of kidney: Secondary | ICD-10-CM | POA: Diagnosis not present

## 2014-05-17 DIAGNOSIS — N390 Urinary tract infection, site not specified: Secondary | ICD-10-CM | POA: Diagnosis not present

## 2014-06-02 NOTE — Patient Instructions (Addendum)
Nicole [name]  06/02/2014   Your procedure is scheduled on: 06/09/14   Report to [facility] Main  Entrance and follow signs to               RADIOLOGY 7:00 at AM.   Call this number if you have problems the morning of surgery [phone]   Remember:  Do not eat food or drink liquids :After Midnight.     Take these medicines the morning of surgery with A SIP OF WATER: NONE                               You may not have any metal on your body including hair pins and              piercings  Do not wear jewelry, make-up, lotions, powders or perfumes.             Do not wear nail polish.  Do not shave  48 hours prior to surgery.              Men may shave face and neck.   Do not bring valuables to the hospital. [place].  Contacts, dentures or bridgework may not be worn into surgery.  Leave suitcase in the car. After surgery it may be brought to your room.     Patients discharged the day of surgery will not be allowed to drive home.  Name and phone number of your driver:  Special Instructions: N/A              Please read over the following fact sheets you were given: _____________________________________________________________________                                                     [place]  Before surgery, you can play an important role.  Because skin is not sterile, your skin needs to be as free of germs as possible.  You can reduce the number of germs on your skin by washing with CHG (chlorahexidine gluconate) soap before surgery.  CHG is an antiseptic cleaner which kills germs and bonds with the skin to continue killing germs even after washing. Please DO NOT use if you have an allergy to CHG or antibacterial soaps.  If your skin becomes reddened/irritated stop using the CHG and inform your nurse when you arrive at Short Stay. Do not shave (including legs and underarms)  for at least 48 hours prior to the first CHG shower.  You may shave your face. Please follow these instructions carefully:   1.  Shower with CHG Soap the night before surgery and the  morning of Surgery.   2.  If you choose to wash your hair, wash your hair first as usual with your  normal  Shampoo.   3.  After you shampoo, rinse your hair and body thoroughly to remove the  shampoo.  4.  Use CHG as you would any other liquid soap.  You can apply chg directly  to the skin and wash . Gently wash with scrungie or clean wascloth    5.  Apply the CHG Soap to your body ONLY FROM THE NECK DOWN.   Do not use on open                           Wound or open sores. Avoid contact with eyes, ears mouth and genitals (private parts).                        Genitals (private parts) with your normal soap.              6.  Wash thoroughly, paying special attention to the area where your surgery  will be performed.   7.  Thoroughly rinse your body with warm water from the neck down.   8.  DO NOT shower/wash with your normal soap after using and rinsing off  the CHG Soap .                9.  Pat yourself dry with a clean towel.             10.  Wear clean pajamas.             11.  Place clean sheets on your bed the night of your first shower and do not  sleep with pets.  Day of Surgery : Do not apply any lotions/deodorants the morning of surgery.  Please wear clean clothes to the hospital/surgery center.  FAILURE TO FOLLOW THESE INSTRUCTIONS MAY RESULT IN THE CANCELLATION OF YOUR SURGERY    PATIENT SIGNATURE_________________________________  ______________________________________________________________________    WHAT IS A BLOOD TRANSFUSION? Blood Transfusion Information  A transfusion is the replacement of blood or some of its parts. Blood is made up of multiple cells which provide different functions.  Red blood cells carry oxygen and are used for  blood loss replacement.  White blood cells fight against infection.  Platelets control bleeding.  Plasma helps clot blood.  Other blood products are available for specialized needs, such as hemophilia or other clotting disorders. BEFORE THE TRANSFUSION  Who gives blood for transfusions?   Healthy volunteers who are fully evaluated to make sure their blood is safe. This is blood bank blood. Transfusion therapy is the safest it has ever been in the practice of medicine. Before blood is taken from a donor, a complete history is taken to make sure that person has no history of diseases nor engages in risky social behavior (examples are intravenous drug use or sexual activity with multiple partners). The donor's travel history is screened to minimize risk of transmitting infections, such as malaria. The donated blood is tested for signs of infectious diseases, such as HIV and hepatitis. The blood is then tested to be sure it is compatible with you in order to minimize the chance of a transfusion reaction. If you or a relative donates blood, this is often done in anticipation of surgery and is not appropriate for emergency situations. It takes many days to process the donated blood. RISKS AND COMPLICATIONS Although transfusion therapy is very safe and saves many lives, the main dangers of transfusion include:   Getting an infectious disease.  Developing a transfusion reaction. This is an allergic reaction to something in the blood you were given. Every  precaution is taken to prevent this. The decision to have a blood transfusion has been considered carefully by your caregiver before blood is given. Blood is not given unless the benefits outweigh the risks. AFTER THE TRANSFUSION  Right after receiving a blood transfusion, you will usually feel much better and more energetic. This is especially true if your red blood cells have gotten low (anemic). The transfusion raises the level of the red blood  cells which carry oxygen, and this usually causes an energy increase.  The nurse administering the transfusion will monitor you carefully for complications. HOME CARE INSTRUCTIONS  No special instructions are needed after a transfusion. You may find your energy is better. Speak with your caregiver about any limitations on activity for underlying diseases you may have. SEEK MEDICAL CARE IF:   Your condition is not improving after your transfusion.  You develop redness or irritation at the intravenous (IV) site. SEEK IMMEDIATE MEDICAL CARE IF:  Any of the following symptoms occur over the next 12 hours:  Shaking chills.  You have a temperature by mouth above 102 F (38.9 C), not controlled by medicine.  Chest, back, or muscle pain.  People around you feel you are not acting correctly or are confused.  Shortness of breath or difficulty breathing.  Dizziness and fainting.  You get a rash or develop hives.  You have a decrease in urine output.  Your urine turns a dark color or changes to pink, red, or brown. Any of the following symptoms occur over the next 10 days:  You have a temperature by mouth above 102 F (38.9 C), not controlled by medicine.  Shortness of breath.  Weakness after normal activity.  The white part of the eye turns yellow (jaundice).  You have a decrease in the amount of urine or are urinating less often.  Your urine turns a dark color or changes to pink, red, or brown. Document Released: 03/01/2000 Document Revised: 05/27/2011 Document Reviewed: 10/19/2007 [facility] Patient Information 2014 [place], [place].  _______________________________________________________________________

## 2014-06-03 ENCOUNTER — Encounter (HOSPITAL_COMMUNITY): Payer: Self-pay

## 2014-06-03 ENCOUNTER — Encounter (HOSPITAL_COMMUNITY)
Admission: RE | Admit: 2014-06-03 | Discharge: 2014-06-03 | Disposition: A | Discharge status: Home / Self Care | Payer: 59 | Source: Ambulatory Visit | Attending: Urology | Admitting: Urology

## 2014-06-03 DIAGNOSIS — Z01812 Encounter for preprocedural laboratory examination: Secondary | ICD-10-CM | POA: Insufficient documentation

## 2014-06-03 DIAGNOSIS — N200 Calculus of kidney: Secondary | ICD-10-CM | POA: Diagnosis not present

## 2014-06-03 HISTORY — DX: Personal history of other diseases of the musculoskeletal system and connective tissue: Z87.39

## 2014-06-03 LAB — CBC
HEMATOCRIT: 39.2 % (ref 36.0–46.0)
HEMOGLOBIN: 12.4 g/dL (ref 12.0–15.0)
MCH: 29.3 pg (ref 26.0–34.0)
MCHC: 31.6 g/dL (ref 30.0–36.0)
MCV: 92.7 fL (ref 78.0–100.0)
Platelets: 211 10*3/uL (ref 150–400)
RBC: 4.23 MIL/uL (ref 3.87–5.11)
RDW: 13.4 % (ref 11.5–15.5)
WBC: 10.2 10*3/uL (ref 4.0–10.5)

## 2014-06-03 LAB — BASIC METABOLIC PANEL
ANION GAP: 10 (ref 5–15)
BUN: 18 mg/dL (ref 6–23)
CHLORIDE: 106 mmol/L (ref 96–112)
CO2: 25 mmol/L (ref 19–32)
Calcium: 9 mg/dL (ref 8.4–10.5)
Creatinine, Ser: 0.82 mg/dL (ref 0.50–1.10)
GFR calc non Af Amer: 74 mL/min — ABNORMAL LOW (ref >90)
GFR, EST AFRICAN AMERICAN: 86 mL/min — AB (ref >90)
Glucose, Bld: 93 mg/dL (ref 70–99)
POTASSIUM: 4.5 mmol/L (ref 3.5–5.1)
SODIUM: 141 mmol/L (ref 135–145)

## 2014-06-06 ENCOUNTER — Other Ambulatory Visit (HOSPITAL_COMMUNITY): Payer: Self-pay | Admitting: Family Medicine

## 2014-06-06 DIAGNOSIS — M8580 Other specified disorders of bone density and structure, unspecified site: Secondary | ICD-10-CM

## 2014-06-07 ENCOUNTER — Ambulatory Visit (HOSPITAL_COMMUNITY)
Admission: RE | Admit: 2014-06-07 | Discharge: 2014-06-07 | Disposition: A | Discharge status: Home / Self Care | Payer: 59 | Source: Ambulatory Visit | Attending: Family Medicine | Admitting: Family Medicine

## 2014-06-07 DIAGNOSIS — M8580 Other specified disorders of bone density and structure, unspecified site: Secondary | ICD-10-CM | POA: Diagnosis not present

## 2014-06-08 ENCOUNTER — Telehealth (INDEPENDENT_AMBULATORY_CARE_PROVIDER_SITE_OTHER): Payer: Self-pay | Admitting: *Deleted

## 2014-06-08 ENCOUNTER — Other Ambulatory Visit (INDEPENDENT_AMBULATORY_CARE_PROVIDER_SITE_OTHER): Payer: Self-pay | Admitting: *Deleted

## 2014-06-08 ENCOUNTER — Encounter (INDEPENDENT_AMBULATORY_CARE_PROVIDER_SITE_OTHER): Payer: Self-pay | Admitting: *Deleted

## 2014-06-08 ENCOUNTER — Other Ambulatory Visit: Payer: Self-pay | Admitting: Radiology

## 2014-06-08 DIAGNOSIS — Z85038 Personal history of other malignant neoplasm of large intestine: Secondary | ICD-10-CM

## 2014-06-08 NOTE — H&P (Signed)
H&P  Chief Complaint: Left kidney stones  History of Present Illness: Nicole [name] is a 64 y.o. year old female who presents for percutaneous management of a large left renal stone burden. Her historyis as follows:  She had a large left upper pole renal calculus that was poorly calcified, and underwent, following stent placement, staged lithotripsy on 02/10/2012 and 03/09/2012.    CT scan 04/2012 revealed 2  4 mm or smaller lower pole fragments, no hydronephrosis. Renal U/S performed on 02/25/2013 revealed a 4.6 mm stone in the left lower pole with hydro before voiding but none after.   She had a CT scan done 02/21/2014.  It revealed a 12 mm upper, 11 mm interpolar, an 11 mm lower pole stone on the left.  There is parenchymal scarring.  There  was no hydro.  She did have UTIs in the Summer 2015 as well as Feb 2016. These were appropriately treated. She is now on abx suppression.   Past Medical History  Diagnosis Date  . B12 deficiency   . Right shoulder pain S/P ROTATOR CUFF REPAIR  . Chronic diarrhea SECONDARY HX COLON CANCER  . Personal history of renal cancer S/P LEFT PARTIAL NEPHRECTOMY 2007--  NO RECURRENCE    DUE TO KIDNEY CANCER  . History of colon cancer [facility] SYNDROME ---  COLORECTAL CANCER S/P COLECTOMY X3  LAST ONE 2007    NO RECURRENCE  . Arthritis     sh[place]ul[place]  . History of kidney stones   . History of colon cancer 05/04/2013  . History of MRSA infection 08/2013    left leg  . History of kidney infection 08/2013  . Hx of osteopenia   . Diarrhea   . Kidney stone   . Lynch syndrome 10/17/2010    Hereditary colon cancer  . NHL (non-Hodgkin's lymphoma) 10/17/2010    MONITORED BY DR Tressie Stalker    Past Surgical History  Procedure Laterality Date  . Hemicolectomy  2001    right  . Hemicolectomy  2002    left  . Kidney resection  2007    left partial  . Subtotal colectomy  2007  . Flexible sigmoidoscopy  08/29/2011    Procedure: FLEXIBLE SIGMOIDOSCOPY;  Surgeon:  Rogene Houston, MD;  Location[facility];  Service: Endoscopy;  Laterality: N/A;  930  . Colonoscopy w/ polypectomy    . Left flank exploration w/  partial left nephrectomy/ left hilar lymph node bx  04-17-2005  DR Namari Breton    RENAL CELL CARCINOMA  . Laparoscopic assisted vaginal hysterectomy  1993    W/ BILATERAL SALPINGO-OOPHORECTOMY  . Right rotator  cuff repair/   bicep repair  10-17-2011  DR SUPPLE  . Appendectomy  1993    W/ LAVH  . Cystoscopy with stent placement  02/07/2012    Procedure: CYSTOSCOPY WITH STENT PLACEMENT;  Surgeon: Franchot Gallo, MD;  Location: [facility];  Service: Urology;  Laterality: Left;  . Lithotripsy  11-22013    x2  . Flexible sigmoidoscopy N/A 09/28/2012    Procedure: FLEXIBLE SIGMOIDOSCOPY;  Surgeon: Rogene Houston, MD;  Location: [facility];  Service: Endoscopy;  Laterality: N/A;  730    Home Medications:  No prescriptions prior to admission    Allergies:  Allergies  Allergen Reactions  . Latex Rash  . Morphine And Related Other (See Comments)    hallucinations  . Tape Rash    Certain tapes     Family History  Problem Relation Age  of Onset  . Cancer Son     Social History:  reports that she has never smoked. She has never used smokeless tobacco. She reports that she does not drink alcohol or use illicit drugs.  ROS: A complete review of systems was performed.  All systems are negative except for pertinent findings as noted.  Physical Exam:  Vital signs in last 24 hours:   General:  Alert and oriented, No acute distress HEENT: Normocephalic, atraumatic Neck: No JVD or lymphadenopathy Cardiovascular: Regular rate and rhythm Lungs: Clear bilaterally Abdomen: Obese, soft, nontender, nondistended, no abdominal masses Back: No CVA tenderness Extremities: No edema Neurologic: Grossly intact  Laboratory Data:  No results found for this or any previous visit (from the past 24 hour(s)). No results found for  this or any previous visit (from the past 240 hour(s)). Creatinine:  Recent Labs  06/03/14 1155  CREATININE 0.82    Radiologic Imaging: Dg Bone Density  06/07/2014   EXAM: DUAL X-RAY ABSORPTIOMETRY (DXA) FOR BONE MINERAL DENSITY  IMPRESSION: Ordering Physician:  Dr. Lemmie Evens,  Your patient Nicole [name] completed a BMD test on 06/07/2014 using the [place] (software version: 14.10) manufactured by [employer]. The following summarizes the results of our evaluation. PATIENT BIOGRAPHICAL: Name: Nicole [name] Patient ID: [id number] Birth Date: [birth year] Height: 60.0 in. Gender: Female Exam Date: 06/07/2014 Weight: 192.0 lbs. Indications: Bilateral Oophrectomy, Caucasian, Follow up Osteopenia, Low Calcium Intake, Post Menopausal Fractures: Treatments: DENSITOMETRY RESULTS: Site      Region     Measured Date Measured Age WHO Classification Young Adult T-score BMD         %Change vs. Previous Significant Change (*) [facility] Spine L1-L3 06/07/2014 64.8 Normal -0.6 1.094 g/cm2  DualFemur Neck Right 06/07/2014 64.8 Osteopenia -1.5 0.830 g/cm2 ASSESSMENT: BMD as determined from Femur Neck Right is 0.830 g/cm2 with a T-Score of -1.5. This patient is considered osteopenic according to [facility]) criteria. Per official position of the ISCD, it is not possible to quantitatively compare BMD or calculate a LSC between different facilities. (L-4 was excluded due to advanced degenerative changes.) See FRAX report on next page.  World Pharmacologist (WHO) criteria for post-menopausal, Caucasian Women: Normal:       T-score at or above -1 SD Osteopenia:   T-score between -1 and -2.5 SD Osteoporosis: T-score at or below -2.5 SD  RECOMMENDATIONS: [place] recommends that FDA-approved medial therapies be considered in postmenopausal women and men age 16 or older with a: 1. Hip or vertberal (clinical or morphometric) fracture. 2. T-Score of < -2.5  at the spine or hip. 3. Ten-year fracture probability by FRAX of 3% or greater for hip fracture or 20% or greater for major osteoporotic fracture.  All treatment decisions require clinical judgment and consideration of indiviual patient factors, including patient preferences, co-morbidities, previous drug use, risk factors not captured in the FRAX model (e.g. falls, vitamin D deficiency, increased bone turnover, interval significant decline in bone density) and possible under-or over-estimation of fracture risk by FRAX.  All patients should ensure an adequate intake of dietary calcium (1200 mg/d) and vitamin D (800 IU daily) unless contraindicated.  FOLLOW-UP: People with diagnosed cases of osteoporosis or osteopenia should be regularly tested for bone mineral density. For patients eligible for Medicare, routine testing is allowed once every 2 years. Testing frequency can be increased for patients who have rapidly progressing disease, or for those who are receiving medical therapy to restore  bone mass. I have reviewed this report, and agree with the above findings.  [facility] Your patient Shelby B Ogden completed a FRAX assessment on 06/07/2014 using the [place] (analysis version: 14.10) manufactured by [employer]. The following summarizes the results of our evaluation.  PATIENT BIOGRAPHICAL: Name: ODELIA, GRACIANO Patient ID: [id number] Birth Date: [birth year] Height:    60.0 in. Gender:     Female    Age:        65.8       Weight:    192.0 lbs. Ethnicity:  White                            Exam Date: 06/07/2014  FRAX* RESULTS:  (version: 3.5) 10-year Probability of Fracture1 Major Osteoporotic Fracture2 Hip Fracture 8.0% 0.8% Population: [place] (Caucasian) Risk Factors: None  Based on Femur (Right) Neck BMD  1 -The 10-year probability of fracture may be lower than reported if the patient has received treatment. 2 -Major Osteoporotic Fracture: Clinical Spine, Forearm, Hip or Shoulder   *FRAX is a Materials engineer of the [employer] for Metabolic Bone Disease, a [place] (WHO) [facility].  ASSESSMENT: The probability of a major osteoporotic fracture is 8.0% within the next ten years.  The probability of a hip fracture is 0.8% within the next ten years.   Electronically Signed   By: Marin Olp M.D.   On: 06/07/2014 09:28    Impression/Assessment:  Large left renal stones  Plan: Left PCNL  Jorja Loa 06/08/2014, 7:37 PM  [place] Boxer. Juelz Whittenberg MD

## 2014-06-08 NOTE — Telephone Encounter (Signed)
Referring MD/PCP: [place]   Procedure: flex sig  Reason/Indication:  Hx colon ca  Has patient had this procedure before?  Yes, 09/2012  If so, when, by whom and where?    Is there a family history of colon cancer?  yes  Who?  What age when diagnosed?    Is patient diabetic?   no      Does patient have prosthetic heart valve?  no  Do you have a pacemaker?  no  Has patient ever had endocarditis? no  Has patient had joint replacement within last 12 months?  no  Does patient tend to be constipated or take laxatives? no  Is patient on Coumadin, Plavix and/or Aspirin? no  Medications: see epic  Allergies: see epic  Medication Adjustment:   Procedure date & time: 07/08/14 at 830

## 2014-06-09 ENCOUNTER — Ambulatory Visit (HOSPITAL_COMMUNITY): Payer: 59

## 2014-06-09 ENCOUNTER — Ambulatory Visit (HOSPITAL_COMMUNITY): Payer: 59 | Admitting: Anesthesiology

## 2014-06-09 ENCOUNTER — Observation Stay (HOSPITAL_COMMUNITY)
Admission: RE | Admit: 2014-06-09 | Discharge: 2014-06-10 | Disposition: A | Discharge status: Home / Self Care | Payer: 59 | Source: Ambulatory Visit | Attending: Urology | Admitting: Urology

## 2014-06-09 ENCOUNTER — Ambulatory Visit (HOSPITAL_COMMUNITY)
Admission: RE | Admit: 2014-06-09 | Discharge: 2014-06-09 | Disposition: A | Discharge status: Home / Self Care | Payer: 59 | Source: Ambulatory Visit | Attending: Urology | Admitting: Urology

## 2014-06-09 ENCOUNTER — Encounter (HOSPITAL_COMMUNITY): Payer: Self-pay | Admitting: *Deleted

## 2014-06-09 ENCOUNTER — Encounter (HOSPITAL_COMMUNITY)
Admission: RE | Disposition: A | Discharge status: Home / Self Care | Payer: Self-pay | Source: Ambulatory Visit | Attending: Urology

## 2014-06-09 DIAGNOSIS — Z91040 Latex allergy status: Secondary | ICD-10-CM | POA: Insufficient documentation

## 2014-06-09 DIAGNOSIS — Z8572 Personal history of non-Hodgkin lymphomas: Secondary | ICD-10-CM | POA: Diagnosis not present

## 2014-06-09 DIAGNOSIS — M8580 Other specified disorders of bone density and structure, unspecified site: Secondary | ICD-10-CM | POA: Insufficient documentation

## 2014-06-09 DIAGNOSIS — E538 Deficiency of other specified B group vitamins: Secondary | ICD-10-CM | POA: Insufficient documentation

## 2014-06-09 DIAGNOSIS — N200 Calculus of kidney: Secondary | ICD-10-CM | POA: Diagnosis present

## 2014-06-09 DIAGNOSIS — Z905 Acquired absence of kidney: Secondary | ICD-10-CM | POA: Diagnosis not present

## 2014-06-09 DIAGNOSIS — Z9049 Acquired absence of other specified parts of digestive tract: Secondary | ICD-10-CM | POA: Insufficient documentation

## 2014-06-09 DIAGNOSIS — M1990 Unspecified osteoarthritis, unspecified site: Secondary | ICD-10-CM | POA: Insufficient documentation

## 2014-06-09 DIAGNOSIS — Z90710 Acquired absence of both cervix and uterus: Secondary | ICD-10-CM | POA: Diagnosis not present

## 2014-06-09 DIAGNOSIS — Z85528 Personal history of other malignant neoplasm of kidney: Secondary | ICD-10-CM | POA: Diagnosis not present

## 2014-06-09 DIAGNOSIS — Z886 Allergy status to analgesic agent status: Secondary | ICD-10-CM | POA: Diagnosis not present

## 2014-06-09 DIAGNOSIS — Z85038 Personal history of other malignant neoplasm of large intestine: Secondary | ICD-10-CM | POA: Insufficient documentation

## 2014-06-09 HISTORY — PX: NEPHROLITHOTOMY: SHX5134

## 2014-06-09 LAB — PROTIME-INR
INR: 1.01 (ref 0.00–1.49)
Prothrombin Time: 13.4 seconds (ref 11.6–15.2)

## 2014-06-09 LAB — CBC WITH DIFFERENTIAL/PLATELET
Basophils Absolute: 0.1 K/uL (ref 0.0–0.1)
Basophils Relative: 1 % (ref 0–1)
Eosinophils Absolute: 0.2 K/uL (ref 0.0–0.7)
Eosinophils Relative: 3 % (ref 0–5)
HCT: 40.6 % (ref 36.0–46.0)
Hemoglobin: 13.4 g/dL (ref 12.0–15.0)
Lymphocytes Relative: 22 % (ref 12–46)
Lymphs Abs: 1.7 K/uL (ref 0.7–4.0)
MCH: 30 pg (ref 26.0–34.0)
MCHC: 33 g/dL (ref 30.0–36.0)
MCV: 90.8 fL (ref 78.0–100.0)
Monocytes Absolute: 0.8 K/uL (ref 0.1–1.0)
Monocytes Relative: 10 % (ref 3–12)
Neutro Abs: 4.8 K/uL (ref 1.7–7.7)
Neutrophils Relative %: 64 % (ref 43–77)
Platelets: 170 K/uL (ref 150–400)
RBC: 4.47 MIL/uL (ref 3.87–5.11)
RDW: 13.4 % (ref 11.5–15.5)
WBC: 7.5 K/uL (ref 4.0–10.5)

## 2014-06-09 LAB — BASIC METABOLIC PANEL
Anion gap: 12 (ref 5–15)
BUN: 14 mg/dL (ref 6–23)
CO2: 24 mmol/L (ref 19–32)
Calcium: 9.5 mg/dL (ref 8.4–10.5)
Chloride: 105 mmol/L (ref 96–112)
Creatinine, Ser: 0.73 mg/dL (ref 0.50–1.10)
GFR calc Af Amer: >90 mL/min (ref >90)
GFR, EST NON AFRICAN AMERICAN: 88 mL/min — AB (ref >90)
GLUCOSE: 100 mg/dL — AB (ref 70–99)
POTASSIUM: 4 mmol/L (ref 3.5–5.1)
Sodium: 141 mmol/L (ref 135–145)

## 2014-06-09 LAB — APTT: APTT: 28 s (ref 24–37)

## 2014-06-09 SURGERY — NEPHROLITHOTOMY PERCUTANEOUS
Anesthesia: General | Laterality: Left

## 2014-06-09 MED ORDER — ONDANSETRON HCL 4 MG/2ML IJ SOLN
INTRAMUSCULAR | Status: DC | PRN
  Administered 2014-06-09: 4 mg via INTRAVENOUS

## 2014-06-09 MED ORDER — OXYCODONE HCL 5 MG PO TABS
5.0000 mg | ORAL_TABLET | ORAL | Status: DC | PRN
  Administered 2014-06-09: 5 mg via ORAL
  Filled 2014-06-09: qty 1

## 2014-06-09 MED ORDER — SODIUM CHLORIDE 0.45 % IV SOLN
INTRAVENOUS | Status: DC
  Administered 2014-06-09: 16:00:00 via INTRAVENOUS

## 2014-06-09 MED ORDER — DEXAMETHASONE SODIUM PHOSPHATE 10 MG/ML IJ SOLN
INTRAMUSCULAR | Status: AC
  Filled 2014-06-09: qty 1

## 2014-06-09 MED ORDER — SODIUM CHLORIDE 0.9 % IV SOLN
INTRAVENOUS | Status: DC
  Administered 2014-06-09: 09:00:00 via INTRAVENOUS

## 2014-06-09 MED ORDER — FENTANYL CITRATE 0.05 MG/ML IJ SOLN
INTRAMUSCULAR | Status: AC
Start: 2014-06-09 — End: 2014-06-09
  Filled 2014-06-09: qty 4

## 2014-06-09 MED ORDER — IOHEXOL 300 MG/ML  SOLN
20.0000 mL | Freq: Once | INTRAMUSCULAR | Status: AC | PRN
  Administered 2014-06-09: 20 mL

## 2014-06-09 MED ORDER — CISATRACURIUM BESYLATE (PF) 10 MG/5ML IV SOLN
INTRAVENOUS | Status: DC | PRN
  Administered 2014-06-09 (×3): 2 mg via INTRAVENOUS
  Administered 2014-06-09: 8 mg via INTRAVENOUS

## 2014-06-09 MED ORDER — HYDROMORPHONE HCL 1 MG/ML IJ SOLN
INTRAMUSCULAR | Status: AC
  Filled 2014-06-09: qty 1

## 2014-06-09 MED ORDER — MIDAZOLAM HCL 2 MG/2ML IJ SOLN
INTRAMUSCULAR | Status: AC
  Filled 2014-06-09: qty 2

## 2014-06-09 MED ORDER — LIDOCAINE HCL (CARDIAC) 20 MG/ML IV SOLN
INTRAVENOUS | Status: AC
  Filled 2014-06-09: qty 5

## 2014-06-09 MED ORDER — GLYCOPYRROLATE 0.2 MG/ML IJ SOLN
INTRAMUSCULAR | Status: DC | PRN
  Administered 2014-06-09: 0.6 mg via INTRAVENOUS

## 2014-06-09 MED ORDER — NEOSTIGMINE METHYLSULFATE 10 MG/10ML IV SOLN
INTRAVENOUS | Status: AC
  Filled 2014-06-09: qty 1

## 2014-06-09 MED ORDER — DIPHENOXYLATE-ATROPINE 2.5-0.025 MG PO TABS
2.0000 | ORAL_TABLET | Freq: Three times a day (TID) | ORAL | Status: DC
  Administered 2014-06-09 – 2014-06-10 (×2): 2 via ORAL
  Filled 2014-06-09 (×2): qty 2

## 2014-06-09 MED ORDER — ATROPINE SULFATE 0.4 MG/ML IJ SOLN
INTRAMUSCULAR | Status: AC
  Filled 2014-06-09: qty 1

## 2014-06-09 MED ORDER — METOCLOPRAMIDE HCL 5 MG/ML IJ SOLN
INTRAMUSCULAR | Status: DC | PRN
  Administered 2014-06-09: 10 mg via INTRAVENOUS

## 2014-06-09 MED ORDER — FENTANYL CITRATE 0.05 MG/ML IJ SOLN
INTRAMUSCULAR | Status: AC
  Filled 2014-06-09: qty 2

## 2014-06-09 MED ORDER — FENTANYL CITRATE 0.05 MG/ML IJ SOLN
INTRAMUSCULAR | Status: DC | PRN
  Administered 2014-06-09 (×3): 50 ug via INTRAVENOUS

## 2014-06-09 MED ORDER — PROPOFOL 10 MG/ML IV BOLUS
INTRAVENOUS | Status: DC | PRN
  Administered 2014-06-09: 150 mg via INTRAVENOUS

## 2014-06-09 MED ORDER — GLYCOPYRROLATE 0.2 MG/ML IJ SOLN
INTRAMUSCULAR | Status: AC
  Filled 2014-06-09: qty 3

## 2014-06-09 MED ORDER — ONDANSETRON HCL 4 MG/2ML IJ SOLN
INTRAMUSCULAR | Status: AC
Start: 2014-06-09 — End: 2014-06-09
  Filled 2014-06-09: qty 2

## 2014-06-09 MED ORDER — ACETAMINOPHEN 325 MG PO TABS: 650.0000 mg | ORAL_TABLET | ORAL | Status: DC | PRN

## 2014-06-09 MED ORDER — SODIUM CHLORIDE 0.9 % IR SOLN
Status: DC | PRN
  Administered 2014-06-09: 24000 mL

## 2014-06-09 MED ORDER — DOCUSATE SODIUM 100 MG PO CAPS
100.0000 mg | ORAL_CAPSULE | Freq: Two times a day (BID) | ORAL | Status: DC
  Administered 2014-06-09 – 2014-06-10 (×2): 100 mg via ORAL
  Filled 2014-06-09 (×2): qty 1

## 2014-06-09 MED ORDER — LACTATED RINGERS IV SOLN
INTRAVENOUS | Status: DC
  Administered 2014-06-09: 12:00:00 via INTRAVENOUS
  Administered 2014-06-09: 1000 mL via INTRAVENOUS

## 2014-06-09 MED ORDER — MIDAZOLAM HCL 5 MG/5ML IJ SOLN
INTRAMUSCULAR | Status: DC | PRN
  Administered 2014-06-09: 2 mg via INTRAVENOUS

## 2014-06-09 MED ORDER — HYDROMORPHONE HCL 1 MG/ML IJ SOLN
0.2500 mg | INTRAMUSCULAR | Status: DC | PRN
  Administered 2014-06-09 (×2): 0.25 mg via INTRAVENOUS

## 2014-06-09 MED ORDER — LIDOCAINE HCL (CARDIAC) 20 MG/ML IV SOLN
INTRAVENOUS | Status: DC | PRN
  Administered 2014-06-09: 100 mg via INTRAVENOUS

## 2014-06-09 MED ORDER — NEOSTIGMINE METHYLSULFATE 10 MG/10ML IV SOLN
INTRAVENOUS | Status: DC | PRN
Start: 2014-06-09 — End: 2014-06-09
  Administered 2014-06-09: 4 mg via INTRAVENOUS

## 2014-06-09 MED ORDER — OXYBUTYNIN CHLORIDE 5 MG PO TABS: 5.0000 mg | ORAL_TABLET | Freq: Three times a day (TID) | ORAL | Status: DC | PRN

## 2014-06-09 MED ORDER — METOCLOPRAMIDE HCL 5 MG/ML IJ SOLN
INTRAMUSCULAR | Status: AC
  Filled 2014-06-09: qty 2

## 2014-06-09 MED ORDER — FENTANYL CITRATE 0.05 MG/ML IJ SOLN
INTRAMUSCULAR | Status: AC | PRN
  Administered 2014-06-09 (×3): 25 ug via INTRAVENOUS

## 2014-06-09 MED ORDER — DEXAMETHASONE SODIUM PHOSPHATE 10 MG/ML IJ SOLN
INTRAMUSCULAR | Status: DC | PRN
  Administered 2014-06-09: 10 mg via INTRAVENOUS

## 2014-06-09 MED ORDER — MEPERIDINE HCL 50 MG/ML IJ SOLN
INTRAMUSCULAR | Status: AC
  Filled 2014-06-09: qty 1

## 2014-06-09 MED ORDER — MIDAZOLAM HCL 2 MG/2ML IJ SOLN
INTRAMUSCULAR | Status: AC | PRN
  Administered 2014-06-09 (×2): 0.5 mg via INTRAVENOUS
  Administered 2014-06-09: 1 mg via INTRAVENOUS

## 2014-06-09 MED ORDER — MIDAZOLAM HCL 2 MG/2ML IJ SOLN
INTRAMUSCULAR | Status: AC
  Filled 2014-06-09: qty 6

## 2014-06-09 MED ORDER — CIPROFLOXACIN IN D5W 400 MG/200ML IV SOLN
INTRAVENOUS | Status: AC
  Filled 2014-06-09: qty 200

## 2014-06-09 MED ORDER — CIPROFLOXACIN IN D5W 400 MG/200ML IV SOLN
400.0000 mg | INTRAVENOUS | Status: AC
  Administered 2014-06-09: 400 mg via INTRAVENOUS

## 2014-06-09 MED ORDER — LIDOCAINE HCL 1 % IJ SOLN
INTRAMUSCULAR | Status: AC
  Filled 2014-06-09: qty 20

## 2014-06-09 MED ORDER — CIPROFLOXACIN HCL 250 MG PO TABS
250.0000 mg | ORAL_TABLET | Freq: Two times a day (BID) | ORAL | Status: DC
  Administered 2014-06-09 – 2014-06-10 (×2): 250 mg via ORAL
  Filled 2014-06-09 (×2): qty 1

## 2014-06-09 MED ORDER — CISATRACURIUM BESYLATE 20 MG/10ML IV SOLN
INTRAVENOUS | Status: AC
Start: 2014-06-09 — End: 2014-06-09
  Filled 2014-06-09: qty 10

## 2014-06-09 MED ORDER — SUCCINYLCHOLINE CHLORIDE 20 MG/ML IJ SOLN
INTRAMUSCULAR | Status: DC | PRN
  Administered 2014-06-09: 100 mg via INTRAVENOUS

## 2014-06-09 MED ORDER — CIPROFLOXACIN IN D5W 400 MG/200ML IV SOLN: 400.0000 mg | INTRAVENOUS | Status: DC

## 2014-06-09 MED ORDER — ONDANSETRON HCL 4 MG/2ML IJ SOLN
4.0000 mg | INTRAMUSCULAR | Status: DC | PRN
  Administered 2014-06-09: 4 mg via INTRAVENOUS
  Filled 2014-06-09: qty 2

## 2014-06-09 MED ORDER — PHENYLEPHRINE 40 MCG/ML (10ML) SYRINGE FOR IV PUSH (FOR BLOOD PRESSURE SUPPORT)
PREFILLED_SYRINGE | INTRAVENOUS | Status: AC
Start: 2014-06-09 — End: 2014-06-09
  Filled 2014-06-09: qty 10

## 2014-06-09 MED ORDER — IOHEXOL 300 MG/ML  SOLN
INTRAMUSCULAR | Status: DC | PRN
  Administered 2014-06-09: 32 mL

## 2014-06-09 MED ORDER — PROPOFOL 10 MG/ML IV BOLUS
INTRAVENOUS | Status: AC
  Filled 2014-06-09: qty 20

## 2014-06-09 MED ORDER — MEPERIDINE HCL 50 MG/ML IJ SOLN
6.2500 mg | INTRAMUSCULAR | Status: DC | PRN
  Administered 2014-06-09 (×2): 6.25 mg via INTRAVENOUS

## 2014-06-09 MED ORDER — ZOLPIDEM TARTRATE 5 MG PO TABS: 5.0000 mg | ORAL_TABLET | Freq: Every evening | ORAL | Status: DC | PRN

## 2014-06-09 SURGICAL SUPPLY — 52 items
APL SKNCLS STERI-STRIP NONHPOA (GAUZE/BANDAGES/DRESSINGS) ×2
BAG URINE DRAINAGE (UROLOGICAL SUPPLIES) ×3 IMPLANT
BASKET ZERO TIP NITINOL 2.4FR (BASKET) ×6 IMPLANT
BENZOIN TINCTURE PRP APPL 2/3 (GAUZE/BANDAGES/DRESSINGS) ×6 IMPLANT
BLADE SURG 15 STRL LF DISP TIS (BLADE) ×1 IMPLANT
BLADE SURG 15 STRL SS (BLADE) ×3
BSKT STON RTRVL ZERO TP 2.4FR (BASKET) ×2
CARTRIDGE STONEBREAK CO2 KIDNE (ELECTROSURGICAL) ×3 IMPLANT
CATH FOLEY 2W COUNCIL 20FR 5CC (CATHETERS) IMPLANT
CATH ROBINSON RED A/P 20FR (CATHETERS) IMPLANT
CATH SILICON 18FR 30CC (CATHETERS) ×3 IMPLANT
CATH X-FORCE N30 NEPHROSTOMY (TUBING) ×3 IMPLANT
COVER SURGICAL LIGHT HANDLE (MISCELLANEOUS) ×3 IMPLANT
DRAPE C-ARM 42X120 X-RAY (DRAPES) ×3 IMPLANT
DRAPE CAMERA CLOSED 9X96 (DRAPES) ×3 IMPLANT
DRAPE LINGEMAN PERC (DRAPES) ×3 IMPLANT
DRAPE SURG IRRIG POUCH 19X23 (DRAPES) ×3 IMPLANT
DRSG PAD ABDOMINAL 8X10 ST (GAUZE/BANDAGES/DRESSINGS) ×6 IMPLANT
DRSG TEGADERM 8X12 (GAUZE/BANDAGES/DRESSINGS) ×3 IMPLANT
FIBER LASER FLEXIVA 1000 (UROLOGICAL SUPPLIES) IMPLANT
FIBER LASER FLEXIVA 200 (UROLOGICAL SUPPLIES) IMPLANT
FIBER LASER FLEXIVA 365 (UROLOGICAL SUPPLIES) IMPLANT
FIBER LASER FLEXIVA 550 (UROLOGICAL SUPPLIES) IMPLANT
FIBER LASER TRAC TIP (UROLOGICAL SUPPLIES) IMPLANT
GAUZE SPONGE 4X4 12PLY STRL (GAUZE/BANDAGES/DRESSINGS) ×3 IMPLANT
GLOVE BIOGEL M 8.0 STRL (GLOVE) ×3 IMPLANT
GOWN STRL REUS W/TWL XL LVL3 (GOWN DISPOSABLE) ×3 IMPLANT
GUIDEWIRE AMPLAZ .035X145 (WIRE) ×9 IMPLANT
KIT BASIN OR (CUSTOM PROCEDURE TRAY) ×3 IMPLANT
MANIFOLD NEPTUNE II (INSTRUMENTS) ×3 IMPLANT
NS IRRIG 1000ML POUR BTL (IV SOLUTION) IMPLANT
PACK BASIC VI WITH GOWN DISP (CUSTOM PROCEDURE TRAY) ×3 IMPLANT
PAD ABD 8X10 STRL (GAUZE/BANDAGES/DRESSINGS) ×3 IMPLANT
PROBE KIDNEY STONEBRKR 2.0X425 (ELECTROSURGICAL) ×3 IMPLANT
PROBE LITHOCLAST ULTRA 3.8X403 (UROLOGICAL SUPPLIES) ×3 IMPLANT
PROBE PNEUMATIC 1.0MMX570MM (UROLOGICAL SUPPLIES) ×3 IMPLANT
SET IRRIG Y TYPE TUR BLADDER L (SET/KITS/TRAYS/PACK) ×3 IMPLANT
SET WARMING FLUID IRRIGATION (MISCELLANEOUS) IMPLANT
SHEATH PEELAWAY SET 9 (SHEATH) ×3 IMPLANT
SHIELD EYE BINOCULAR (MISCELLANEOUS) IMPLANT
SPONGE LAP 4X18 X RAY DECT (DISPOSABLE) ×3 IMPLANT
STENT URET 6FRX24 CONTOUR (STENTS) ×3 IMPLANT
STONE CATCHER W/TUBE ADAPTER (UROLOGICAL SUPPLIES) ×3 IMPLANT
SUT SILK 2 0 30  PSL (SUTURE) ×2
SUT SILK 2 0 30 PSL (SUTURE) ×1 IMPLANT
SYR 20CC LL (SYRINGE) ×6 IMPLANT
SYRINGE 10CC LL (SYRINGE) ×3 IMPLANT
TRAY FOLEY BAG SILVER LF 14FR (CATHETERS) IMPLANT
TRAY FOLEY CATH 14FRSI W/METER (CATHETERS) IMPLANT
TUBING CONNECTING 10 (TUBING) ×4 IMPLANT
TUBING CONNECTING 10' (TUBING) ×2
WATER STERILE IRR 1500ML POUR (IV SOLUTION) IMPLANT

## 2014-06-09 NOTE — Op Note (Addendum)
Preoperative Diagnosis:   Left renal stone greater than 2 cm   Postoperative Diagnosis:   Left renal stone greater than 2 cm   Procedure(s) Performed:   1. Left percutaneous nephrostolithotomy for stone burden greater than 2 cm 2. Left 6Fr x 24 cm ureteral stent 3. Antegrade nephrostogram with nephrostomy tube placement 4. Simple Foley catheter placement 5. Intraoperative fluoroscopy with interpretation less than 1 hour   Teaching Surgeon:  Franchot Gallo, M.D.   Resident Surgeon:  Nathaniel Man.   Assistants:  None listed   Anesthesia:  General via endotracheal tube.     IV Fluids:  See Anesthesia record.   Estimated Blood Loss:  200 mL's.   Cultures:  Stone for gram stain and culture   Drains:  Left 6Fr x 24cm ureteral stent, 18 French Councill tip nephrostomy tube, 16 French Foley catheter, both to drainage.   Specimens:  Left renal stone for chemical analysis    Complications:  None.   Indications for Surgery:  Nicole [name] is a 64 y.o.-year-old female with a history of nephrolithiasis.  The patient was evaluated and noted with multiple left renal stones totaling > 3 cm in diameter.  The patient presents today for percutaneous treatment of Left kidney stone. The risks and benefits of the procedure were discussed with the patient who wishes to proceed.   Operative Findings:  Both upper and lower pole access points were dilated and utilized. Upper pole access was lost prior to dilation so IR was called in to re-establish access. Antegrade nephrostogram post-treatment demonstrated no contrast extravasation. Successful placement of nephrostomy tube.   Procedure:  The patient was correctly identified in the preoperative holding area where written informed consent as well as potential risks and complications were reviewed. The patient was brought back to the operative suite where a preinduction timeout was performed. Once correct information was verified, general  anesthesia was induced via endotracheal tube.  The patient was then gently repositioned into the prone position, paying careful attention to pad all pressure points and affixed the patient to the bed at multiple points of contact.  She was then prepped and draped in the usual sterile fashion and given appropriate perioperative procedural antibiotics.  Sequential compression devices were placed for VTE prophylaxis.  A second timeout was then performed. A 16 French Foley catheter was placed per urethra with return of urine with 10 mL's of water in the balloon.   A superstiff Amplatz wire was advanced down the patient's ureter through the existing upper pole access. Unfortunately, while exchanging this over the [place], wire access was lost. We then utilized the lower pole access and placed a Superstiff over which an 8-10 renal dilator was used to place a second wire. We then developed our percutaneous tract with the advancement of a 30 French x 20 cm Nephromax balloon dilator.  After this, a 13 French sheath was advanced to the edge of the distal calyx on fluoroscopy.     We then performed rigid nephroscopy with the lithotrite. Immediately upon entrance into the collecting system, we encountered the stone and we then proceeded to treat the stone with lithotripsy. After reaching the limit of the stone capable of being treated with the rigid scope, we then switched to flexible nephroscopy and removed additional stone using the laser and basket.   Once we had cleared as much stone as possible through the lower pole access, our colleagues from IR came into the OR to re-obtain upper pole access. 2  wires and balloon dilation was performed in the same fashion as the lower pole and we continued to treat as much stone as possible.   At the conclusion of the procedure, we repeated flexible nephroscopy in the kidney as well and withdrew our sheath over our rigid nephroscope to the edge of renal parenchyma which did not  reveal any residual stone fragments. There were several small fragments in the interpolar calyx just above the lower pole calyx of puncture that we cleared the majority of the stones out of but could not get back into the calyx due to the sharp angle to confirm that it was completely cleared.    At this point, we elected place our stent to leave our nephrostomy tube.  Through the upper pole access, we advanced a 6Fr x 24cm ureteral stent which curled in the bladder and renal pelvis. We then passed an 8 French silicone catheter with a hole punched in the tip over our wire down the patient's ureter without difficulty. Antegrade nephrostogram demonstrated complete filling of the entire renal collecting system with minimal contrast extravasation from the access tract and satisfactory placement of her nephrostomy tube in the renal pelvis.  3 mL's of saline was placed in the balloon and the nephrostomy tube was affixed to the patient's skin using a single 0 silk suture in an interrupted fashion.  We then closed the upper pole access site and placed the nephrostomy tube to drainage.  The site was then dressed in the usual gauze and tape dressing and the patient was carefully returned to supine position.  At this point, the patient was extubated and taken to the recovery area in stable fashion.  I was present for all aspects of this procedure and agree with the above note.   Post-Op Plan/Instructions:  1. Admit patient to Urology Service for routine postoperative care. Tube to be clamped at 5am and removed later on tomorrow if tolerating. Foley out in the am.

## 2014-06-09 NOTE — Discharge Instructions (Signed)
DISCHARGE INSTRUCTIONS FOR PERCUTANEOUS STONE SURGERY °MEDICATIONS:  °1. DO NOT RESUME YOUR IBUPROFEN, or any other medicines like aspirin, motrin, excedrin, advil, aleve, vitamin E, fish oil as these can all cause bleeding x 10 days.  °2. Resume all your other meds from home - except do not take any other pain meds that you may have at home. ° °ACTIVITY °1. No strenuous activity, sexual activity, or lifting greater than 10 pounds for 2 weeks. °2. No driving while on narcotic pain medications °3. Drink plenty of water °4. Continue to walk at home - you can still get blood clots when you are at home, so keep active, but don't over do it. °5. May return to work in 1 week (but not heavy or strenuous activity).  ° °BATHING °You can shower.  Cover your wound with a dressing and remove the dressing immediately after the shower.  Do not submerge wound under water.  ° °WOUND CARE °Your wound will drain bloody fluid and may do so for 7-14 days. You have 2 options for dressings:  °1. You may use gauze and tape to dress your wound.  If you choose this method, then change the dressing as it becomes soaked.  Change it at least once daily until it stops draining. You may switch to a Band Aid once drainage stops. °2. If drainage is copious, you may use an ostomy device.  This is a bag with an andhesive circle.  The circle has a hole in the middle of it and you cut the hole to the size needed to fit the wound.  This will collect the drainage in the bag and allow you to drain the bag as needed.  ° °SIGNS/SYMPTOMS TO CALL: °1. Please call us if you have a fever greater than 101.5, uncontrolled nausea/vomiting, uncontrolled pain, dizziness, unable to urinate, bloody urine, chest pain, shortness of breath, leg swelling, leg pain, redness around wound, drainage from wound, or any other concerns or questions. °2. You can reach us at [phone]. °FOLLOW-UP ° ° °

## 2014-06-09 NOTE — Anesthesia Preprocedure Evaluation (Addendum)
Anesthesia Evaluation  Patient identified by MRN, date of birth, ID band Patient awake    Reviewed: Allergy & Precautions, H&P , NPO status , Patient's Chart, lab work & pertinent test results  Airway Mallampati: II  TM Distance: >3 FB Neck ROM: Full    Dental no notable dental hx. (+) Teeth Intact, Dental Advisory Given   Pulmonary neg pulmonary ROS,  breath sounds clear to auscultation  Pulmonary exam normal       Cardiovascular negative cardio ROS  Rhythm:Regular Rate:Normal     Neuro/Psych negative neurological ROS  negative psych ROS   GI/Hepatic negative GI ROS, Neg liver ROS,   Endo/Other  negative endocrine ROS  Renal/GU Renal disease  negative genitourinary   Musculoskeletal  (+) Arthritis -, Osteoarthritis,    Abdominal   Peds  Hematology negative hematology ROS (+)   Anesthesia Other Findings   Reproductive/Obstetrics negative OB ROS                            Anesthesia Physical Anesthesia Plan  ASA: II  Anesthesia Plan: General   Post-op Pain Management:    Induction: Intravenous  Airway Management Planned: Oral ETT  Additional Equipment:   Intra-op Plan:   Post-operative Plan: Extubation in OR  Informed Consent: I have reviewed the patients History and Physical, chart, labs and discussed the procedure including the risks, benefits and alternatives for the proposed anesthesia with the patient or authorized representative who has indicated his/her understanding and acceptance.   Dental advisory given  Plan Discussed with: CRNA and Surgeon  Anesthesia Plan Comments:        Anesthesia Quick Evaluation

## 2014-06-09 NOTE — Anesthesia Postprocedure Evaluation (Signed)
  Anesthesia Post-op Note  Patient: Nicole [name]  Procedure(s) Performed: Procedure(s): NEPHROLITHOTOMY PERCUTANEOUS (Left)  Patient Location: PACU  Anesthesia Type:General  Level of Consciousness: awake and alert   Airway and Oxygen Therapy: Patient Spontanous Breathing  Post-op Pain: mild  Post-op Assessment: Post-op Vital signs reviewed, Patient's Cardiovascular Status Stable and Respiratory Function Stable  Post-op Vital Signs: Reviewed  Filed Vitals:   06/09/14 1545  BP: 145/67  Pulse: 95  Temp:   Resp: 18    Complications: No apparent anesthesia complications

## 2014-06-09 NOTE — Transfer of Care (Signed)
Immediate Anesthesia Transfer of Care Note  Patient: Nicole [name]  Procedure(s) Performed: Procedure(s): NEPHROLITHOTOMY PERCUTANEOUS (Left)  Patient Location: PACU  Anesthesia Type:General  Level of Consciousness: awake, sedated and patient cooperative  Airway & Oxygen Therapy: Patient Spontanous Breathing and Patient connected to face mask oxygen  Post-op Assessment: Report given to RN and Post -op Vital signs reviewed and stable  Post vital signs: Reviewed and stable  Last Vitals:  Filed Vitals:   06/09/14 0741  BP: 128/79  Pulse: 82  Temp: 36.8 C  Resp: 16    Complications: No apparent anesthesia complications

## 2014-06-09 NOTE — H&P (Signed)
Chief Complaint: Kidney stones  Referring Physician(s): Dahlstedt,Stephen  History of Present Illness: Nicole [name] is a 64 y.o. female with prior history of left renal cell carcinoma and prior partial nephrectomy 2007, non-Hodgkin's lymphoma, colon cancer/ Lynch syndrome as well as nephrolithiasis. Recent imaging has demonstrated progressive staghorn type calculus involving the left kidney with chronic hydronephrosis and inflammation. She presents today for left nephroureteral catheter placements prior to planned left nephrolithotomy.  Past Medical History  Diagnosis Date  . B12 deficiency   . Right shoulder pain S/P ROTATOR CUFF REPAIR  . Chronic diarrhea SECONDARY HX COLON CANCER  . Personal history of renal cancer S/P LEFT PARTIAL NEPHRECTOMY 2007--  NO RECURRENCE    DUE TO KIDNEY CANCER  . History of colon cancer [facility] SYNDROME ---  COLORECTAL CANCER S/P COLECTOMY X3  LAST ONE 2007    NO RECURRENCE  . Arthritis     shorulder  . History of kidney stones   . History of colon cancer 05/04/2013  . History of MRSA infection 08/2013    left leg  . History of kidney infection 08/2013  . Hx of osteopenia   . Diarrhea   . Kidney stone   . Lynch syndrome 10/17/2010    Hereditary colon cancer  . NHL (non-Hodgkin's lymphoma) 10/17/2010    MONITORED BY DR Tressie Stalker    Past Surgical History  Procedure Laterality Date  . Hemicolectomy  2001    right  . Hemicolectomy  2002    left  . Kidney resection  2007    left partial  . Subtotal colectomy  2007  . Flexible sigmoidoscopy  08/29/2011    Procedure: FLEXIBLE SIGMOIDOSCOPY;  Surgeon: Rogene Houston, MD;  [facility];  Service: Endoscopy;  Laterality: N/A;  930  . Colonoscopy w/ polypectomy    . Left flank exploration w/  partial left nephrectomy/ left hilar lymph node bx  04-17-2005  DR DAHLSTEDT    RENAL CELL CARCINOMA  . Laparoscopic assisted vaginal hysterectomy  1993    W/ BILATERAL SALPINGO-OOPHORECTOMY  .  Right rotator  cuff repair/   bicep repair  10-17-2011  DR SUPPLE  . Appendectomy  1993    W/ LAVH  . Cystoscopy with stent placement  02/07/2012    Procedure: CYSTOSCOPY WITH STENT PLACEMENT;  Surgeon: Franchot Gallo, MD;  Location: [facility];  Service: Urology;  Laterality: Left;  . Lithotripsy  11-22013    x2  . Flexible sigmoidoscopy N/A 09/28/2012    Procedure: FLEXIBLE SIGMOIDOSCOPY;  Surgeon: Rogene Houston, MD;  Location: [facility];  Service: Endoscopy;  Laterality: N/A;  730    Allergies: Latex; Morphine and related; and Tape  Medications: Prior to Admission medications   Medication Sig Start Date End Date Taking? Authorizing Provider  acetaminophen (TYLENOL) 650 MG CR tablet Take 1,300 mg by mouth every 8 (eight) hours as needed for pain.    Historical Provider, MD  cephALEXin (KEFLEX) 250 MG capsule Take 250 mg by mouth daily.    Historical Provider, MD  cyanocobalamin (,VITAMIN [name]-12,) 1000 MCG/ML injection Inject 1 mL (1,000 mcg total) into the muscle once. 10/06/13   Baird Cancer, PA-C  diphenoxylate-atropine (LOMOTIL) 2.5-0.025 MG per tablet Take 2 tablets by mouth 3 (three) times daily before meals. 05/04/13   Baird Cancer, PA-C  ibuprofen (ADVIL,MOTRIN) 200 MG tablet Take 600 mg by mouth every 6 (six) hours as needed (Pain).     Historical Provider, MD  Family History  Problem Relation Age of Onset  . Cancer Son     History   Social History  . Marital Status: Divorced    Spouse Name: N/A  . Number of Children: N/A  . Years of Education: N/A   Social History Main Topics  . Smoking status: Never Smoker   . Smokeless tobacco: Never Used  . Alcohol Use: No  . Drug Use: No  . Sexual Activity: Not on file   Other Topics Concern  . Not on file   Social History Narrative      Review of Systems  Constitutional: Negative for fever and chills.  Respiratory: Negative for cough and shortness of breath.   Cardiovascular:  Negative for chest pain.  Gastrointestinal: Negative for nausea, vomiting, abdominal pain and blood in stool.  Genitourinary: Positive for flank pain. Negative for dysuria and hematuria.  Musculoskeletal: Positive for back pain.  Neurological: Negative for headaches.  Hematological: Does not bruise/bleed easily.  Psychiatric/Behavioral: The patient is nervous/anxious.     Vital Signs: Blood pressure 128/79, temperature 98.2, heart rate 82, respirations 16, oxygen saturation 100% on room air  Physical Exam  Constitutional: She is oriented to person, place, and time. She appears well-developed and well-nourished.  Cardiovascular: Normal rate and regular rhythm.   Pulmonary/Chest: Effort normal and breath sounds normal.  Abdominal: Soft. Bowel sounds are normal. There is no tenderness.  Mild left CVA tenderness  Musculoskeletal: Normal range of motion.  Neurological: She is alert and oriented to person, place, and time.    Imaging: Dg Bone Density  06/07/2014   EXAM: DUAL X-RAY ABSORPTIOMETRY (DXA) FOR BONE MINERAL DENSITY  IMPRESSION: Ordering Physician:  Dr. Lemmie Evens,  Your patient Nicole [name] completed a BMD test on 06/07/2014 using the [employer] (software version: 14.10) manufactured by [employer]. The following summarizes the results of our evaluation. PATIENT BIOGRAPHICAL: Name: Nicole [name], Nicole [name] Patient ID: [id number] Birth Date: January 29, 1951 Height: 60.0 in. Gender: Female Exam Date: 06/07/2014 Weight: 192.0 lbs. Indications: Bilateral Oophrectomy, Caucasian, Follow up Osteopenia, Low Calcium Intake, Post Menopausal Fractures: Treatments: DENSITOMETRY RESULTS: Site      Region     Measured Date Measured Age WHO Classification Young Adult T-score BMD         %Change vs. Previous Significant Change (*) [facility] Spine L1-L3 06/07/2014 64.8 Normal -0.6 1.094 g/cm2  DualFemur Neck Right 06/07/2014 64.8 Osteopenia -1.5 0.830 g/cm2 ASSESSMENT: BMD as determined from  Femur Neck Right is 0.830 g/cm2 with a T-Score of -1.5. This patient is considered osteopenic according to [facility]) criteria. Per official position of the ISCD, it is not possible to quantitatively compare BMD or calculate a LSC between different facilities. (L-4 was excluded due to advanced degenerative changes.) See FRAX report on next page.  World Pharmacologist (WHO) criteria for post-menopausal, Caucasian Women: Normal:       T-score at or above -1 SD Osteopenia:   T-score between -1 and -2.5 SD Osteoporosis: T-score at or below -2.5 SD  RECOMMENDATIONS: [place] recommends that FDA-approved medial therapies be considered in postmenopausal women and men age 65 or older with a: 1. Hip or vertberal (clinical or morphometric) fracture. 2. T-Score of < -2.5 at the spine or hip. 3. Ten-year fracture probability by FRAX of 3% or greater for hip fracture or 20% or greater for major osteoporotic fracture.  All treatment decisions require clinical judgment and consideration of indiviual patient factors, including patient preferences, co-morbidities,  previous drug use, risk factors not captured in the FRAX model (e.g. falls, vitamin D deficiency, increased bone turnover, interval significant decline in bone density) and possible under-or over-estimation of fracture risk by FRAX.  All patients should ensure an adequate intake of dietary calcium (1200 mg/d) and vitamin D (800 IU daily) unless contraindicated.  FOLLOW-UP: People with diagnosed cases of osteoporosis or osteopenia should be regularly tested for bone mineral density. For patients eligible for Medicare, routine testing is allowed once every 2 years. Testing frequency can be increased for patients who have rapidly progressing disease, or for those who are receiving medical therapy to restore bone mass. I have reviewed this report, and agree with the above findings.  [facility] Your patient Nicole  [name] Jezek completed a FRAX assessment on 06/07/2014 using the [place] (analysis version: 14.10) manufactured by [employer]. The following summarizes the results of our evaluation.  PATIENT BIOGRAPHICAL: Name: NORA, SABEY Patient ID: [id number] Birth Date: [birth year] Height:    60.0 in. Gender:     Female    [age]       Weight:    192.0 lbs. Ethnicity:  White                            Exam Date: 06/07/2014  FRAX* RESULTS:  (version: 3.5) 10-year Probability of Fracture1 Major Osteoporotic Fracture2 Hip Fracture 8.0% 0.8% Population: [place] (Caucasian) Risk Factors: None  Based on Femur (Right) Neck BMD  1 -The 10-year probability of fracture may be lower than reported if the patient has received treatment. 2 -Major Osteoporotic Fracture: Clinical Spine, Forearm, Hip or Shoulder  *FRAX is a Materials engineer of the [employer] for Metabolic Bone Disease, a [place] (WHO) [facility].  ASSESSMENT: The probability of a major osteoporotic fracture is 8.0% within the next ten years.  The probability of a hip fracture is 0.8% within the next ten years.   Electronically Signed   By: Marin Olp M.D.   On: 06/07/2014 09:28    Labs:  CBC:  Recent Labs  10/23/13 1534 03/21/14 1107 06/03/14 1155 06/09/14 0825  WBC 9.4 8.6 10.2 7.5  HGB 12.4 13.2 12.4 13.4  HCT 37.6 40.2 39.2 40.6  PLT 199 213 211 170    COAGS: No results for input(s): INR, APTT in the last 8760 hours.  BMP:  Recent Labs  03/21/14 1107 06/03/14 1155  NA 137 141  K 3.8 4.5  CL 106 106  CO2 23 25  GLUCOSE 96 93  BUN 20 18  CALCIUM 8.9 9.0  CREATININE 0.96 0.82  GFRNONAA 61* 74*  GFRAA 71* 86*    LIVER FUNCTION TESTS:  Recent Labs  03/21/14 1107  BILITOT 0.5  AST 17  ALT 20  ALKPHOS 71  PROT 7.1  ALBUMIN 3.9    TUMOR MARKERS:  Recent Labs  03/21/14 1107  CEA 2.6    Assessment and Plan:  Nicole [name] is a 64 y.o.  female with prior history of left renal cell carcinoma and prior partial nephrectomy 2007, non-Hodgkin's lymphoma, colon cancer/ Lynch syndrome as well as nephrolithiasis. Recent imaging has demonstrated progressive staghorn type calculus involving the left kidney with chronic hydronephrosis and inflammation. She presents today for left nephroureteral catheter placements prior to planned left nephrolithotomy. Details/risks of procedure, including but not limited to internal bleeding, infection, damage  to adjacent organs, worsening renal function, discussed with patient with her apparent understanding and consent.    Signed: Autumn Messing 06/09/2014, 8:50 AM   I spent a total of 20 minutes face to face in clinical consultation, greater than 50% of which was counseling/coordinating care for left nephroureteral catheter placements

## 2014-06-09 NOTE — Procedures (Signed)
[facility] 90f antegrade nephroureteral catheters pre op No complication No blood loss. See complete dictation in [facility].

## 2014-06-10 ENCOUNTER — Encounter (HOSPITAL_COMMUNITY): Payer: Self-pay | Admitting: Urology

## 2014-06-10 DIAGNOSIS — N200 Calculus of kidney: Secondary | ICD-10-CM | POA: Diagnosis not present

## 2014-06-10 LAB — URINE CULTURE
Colony Count: NO GROWTH
Culture: NO GROWTH

## 2014-06-10 LAB — HEMOGLOBIN AND HEMATOCRIT, BLOOD
HEMATOCRIT: 31.1 % — AB (ref 36.0–46.0)
HEMOGLOBIN: 10.1 g/dL — AB (ref 12.0–15.0)

## 2014-06-10 MED ORDER — OXYBUTYNIN CHLORIDE 5 MG PO TABS: 5.0000 mg | ORAL_TABLET | Freq: Three times a day (TID) | ORAL | Status: DC | PRN

## 2014-06-10 MED ORDER — CIPROFLOXACIN HCL 250 MG PO TABS: 250.0000 mg | ORAL_TABLET | Freq: Two times a day (BID) | ORAL | Status: DC

## 2014-06-10 MED ORDER — OXYCODONE HCL 5 MG PO TABS: 5.0000 mg | ORAL_TABLET | ORAL | Status: DC | PRN

## 2014-06-10 NOTE — Discharge Summary (Signed)
Patient ID: Nicole [name] MRN: [id number] DOB/AGE: 07-03-1950 64 y.o.  Admit date: 06/09/2014 Discharge date: 06/10/2014    Discharge Diagnoses:   Present on Admission:  **None**  Consults:  None    Discharge Medications:   Medication List    TAKE these medications        acetaminophen 650 MG CR tablet  Commonly known as:  TYLENOL  Take 1,300 mg by mouth every 8 (eight) hours as needed for pain.     cephALEXin 250 MG capsule  Commonly known as:  KEFLEX  Take 250 mg by mouth daily.     ciprofloxacin 250 MG tablet  Commonly known as:  CIPRO  Take 1 tablet (250 mg total) by mouth 2 (two) times daily.     cyanocobalamin 1000 MCG/ML injection  Commonly known as:  (VITAMIN B-12)  Inject 1 mL (1,000 mcg total) into the muscle once.     diphenoxylate-atropine 2.5-0.025 MG per tablet  Commonly known as:  LOMOTIL  Take 2 tablets by mouth 3 (three) times daily before meals.     ibuprofen 200 MG tablet  Commonly known as:  ADVIL,MOTRIN  Take 600 mg by mouth every 6 (six) hours as needed (Pain).     oxybutynin 5 MG tablet  Commonly known as:  DITROPAN  Take 1 tablet (5 mg total) by mouth every 8 (eight) hours as needed for bladder spasms.     oxyCODONE 5 MG immediate release tablet  Commonly known as:  Oxy IR/ROXICODONE  Take 1 tablet (5 mg total) by mouth every 4 (four) hours as needed for moderate pain.         Significant Diagnostic Studies:  Dg C-arm Gt 120 Min-no Report  06/09/2014   CLINICAL DATA: intra op   C-ARM GT 120 MINUTE  Fluoroscopy was utilized by the requesting physician.  No radiographic  interpretation.    Ir Nephrostomy Placement Left  06/09/2014   CLINICAL DATA:  Complex left renal calculus. Lower pole percutaneous access used for percutaneous intraoperative nephrolithotomy. Upper pole access is required.  EXAM: INTRAOPERATIVE LEFT PERCUTANEOUS NEPHROSTOMY CATHETER PLACEMENT UNDER FLUOROSCOPIC GUIDANCE  FLUOROSCOPY TIME:  not documented separate  from total intraoperative procedure time  TECHNIQUE: Under fluoroscopic guidance, a 5 [place] Kumpe catheter was advanced through the lower pole working cannula into the renal collecting system and negotiated into the upper pole with the aid of an angled Glidewire. Contrast injected to opacify the upper pole collecting system. An 18 gauge trocar needle was advanced under fluoroscopy into a posterior upper pole calyx. The Glidewire advanced into the renal collecting system. The needle was exchanged for a 5 [place] Kumpe catheter, and the guidewire directed down the ureter. The catheter exchanged for a 9 French peel-away sheath, which allowed passage of a second parallel safety wire. Over the working wire, the 10 mm balloon was advanced and used to dilate the tract to the renal collecting system. Over the inflated balloon, the working cannula was advanced.  COMPLICATIONS: COMPLICATIONS none  IMPRESSION: 1. Technically successful intraoperative left percutaneous nephrostomy catheter placement.   Electronically Signed   By: Lucrezia Europe M.D.   On: 06/09/2014 15:44   Ir Ureteral Stent Left New Access W/o Sep Nephrostomy Cath  06/09/2014   CLINICAL DATA:  Large left renal calculus, preop percutaneous nephrolithotomy  EXAM: LEFT PERCUTANEOUS NEPHROSTOMY CATHETER PLACEMENT x2 UNDER FLUOROSCOPIC GUIDANCE  FLUOROSCOPY TIME:  9 minutes 48 seconds, 282 mGy  TECHNIQUE: The procedure, risks (including but not limited to bleeding, infection,  organ damage ), benefits, and alternatives were explained to the patient. Questions regarding the procedure were encouraged and answered. The patient understands and consents to the procedure.  The leftflank region prepped with Betadine, draped in usual sterile fashion, infiltrated locally with 1% lidocaine.As antibiotic prophylaxis, Cipro was ordered pre-procedure and administered intravenously within one hour of incision.  Intravenous Fentanyl and Versed were administered as conscious  sedation during continuous cardiorespiratory monitoring by the radiology RN, with a total moderate sedation time of 28 minutes.  Under real-time fluoroscopic guidance, a 22 gauge Chiba needle was advanced into a posterior left lower pole calyx using the radiodense calculus as a guide. Urine spontaneously returned through the needle. Needle was exchanged over a guidewire for transitional dilator. Contrast injection confirmed appropriate positioning. Catheter was exchanged over a guidewire for a 5 [place] Kumpe catheter, advanced with the aid of an angled Glidewire to the urinary bladder.  In similar fashion, under real-time fluoroscopic guidance, a 22 gauge Chiba needle was advanced into a posterior left upper pole calyx of the opacified renal collecting system above the calculus. Needle was exchanged over a guidewire for transitional dilator. Contrast injection confirmed appropriate positioning. Catheter was exchanged over a guidewire for a 5 [place] Kumpe catheter, advanced with the aid of an angled Glidewire to the urinary bladder. Catheters capped and covered with a sterile dressing. The patient tolerated the procedure well. Catheter position data was telephoned to Dr. Diona Fanti.  COMPLICATIONS: COMPLICATIONS none  IMPRESSION: 1. Technically successful left percutaneous nephroureteral catheter placement x2 in preparation for percutaneous intraoperative nephrolithotomy.   Electronically Signed   By: Lucrezia Europe M.D.   On: 06/09/2014 11:03   Ir Ureteral Stent Left New Access W/o Sep Nephrostomy Cath  06/09/2014   CLINICAL DATA:  Large left renal calculus, preop percutaneous nephrolithotomy  EXAM: LEFT PERCUTANEOUS NEPHROSTOMY CATHETER PLACEMENT x2 UNDER FLUOROSCOPIC GUIDANCE  FLUOROSCOPY TIME:  9 minutes 48 seconds, 282 mGy  TECHNIQUE: The procedure, risks (including but not limited to bleeding, infection, organ damage ), benefits, and alternatives were explained to the patient. Questions regarding the procedure  were encouraged and answered. The patient understands and consents to the procedure.  The leftflank region prepped with Betadine, draped in usual sterile fashion, infiltrated locally with 1% lidocaine.As antibiotic prophylaxis, Cipro was ordered pre-procedure and administered intravenously within one hour of incision.  Intravenous Fentanyl and Versed were administered as conscious sedation during continuous cardiorespiratory monitoring by the radiology RN, with a total moderate sedation time of 28 minutes.  Under real-time fluoroscopic guidance, a 22 gauge Chiba needle was advanced into a posterior left lower pole calyx using the radiodense calculus as a guide. Urine spontaneously returned through the needle. Needle was exchanged over a guidewire for transitional dilator. Contrast injection confirmed appropriate positioning. Catheter was exchanged over a guidewire for a 5 [place] Kumpe catheter, advanced with the aid of an angled Glidewire to the urinary bladder.  In similar fashion, under real-time fluoroscopic guidance, a 22 gauge Chiba needle was advanced into a posterior left upper pole calyx of the opacified renal collecting system above the calculus. Needle was exchanged over a guidewire for transitional dilator. Contrast injection confirmed appropriate positioning. Catheter was exchanged over a guidewire for a 5 [place] Kumpe catheter, advanced with the aid of an angled Glidewire to the urinary bladder. Catheters capped and covered with a sterile dressing. The patient tolerated the procedure well. Catheter position data was telephoned to Dr. Diona Fanti.  COMPLICATIONS: COMPLICATIONS none  IMPRESSION: 1. Technically successful left  percutaneous nephroureteral catheter placement x2 in preparation for percutaneous intraoperative nephrolithotomy.   Electronically Signed   By: Lucrezia Europe M.D.   On: 06/09/2014 11:03      Hospital Course:  Active Problems:   Renal stones Patient of Dr. Diona Fanti who was  admitted electively for postop management status post a left percutaneous nephrolithotomy for a large left-sided stone burden. Description of her surgical procedure and operative findings are all outlined in the operative note. The patient was kept overnight for observation and pain control. On the morning of discharge her nephrostomy tube was clamped and she continued to do well clinically after 3 hours of observation the nephrostomy tube was removed. She does have an indwelling double-J stent. She's been able to void and her urine is moderately bloody. She has had no fever. Patient was discharged home on postoperative day 1. She will follow-up in [place] with Dr. Marella Chimes stent  Day of Discharge BP 117/53 mmHg  Pulse 111  Temp(Src) 98.2 F (36.8 C) (Oral)  Resp 20  Ht 5' 0.5" (1.537 m)  Wt 87.091 kg (192 lb)  BMI 36.87 kg/m2  SpO2 94%   Well-developed well-nourished female in no acute distress Respiratory effort normal Events soft and nontender. Nephrostomy tube site without active bleeding Remedies no edema or tenderness  Results for orders placed or performed during the hospital encounter of 06/09/14 (from the past 24 hour(s))  Hemoglobin and hematocrit, blood     Status: Abnormal   Collection Time: 06/10/14  5:45 AM  Result Value Ref Range   Hemoglobin 10.1 (L) 12.0 - 15.0 g/dL   HCT 31.1 (L) 36.0 - 46.0 %

## 2014-06-10 NOTE — Progress Notes (Signed)
UR completed 

## 2014-06-13 ENCOUNTER — Ambulatory Visit (HOSPITAL_COMMUNITY): Payer: BC Managed Care – PPO

## 2014-06-13 NOTE — Telephone Encounter (Signed)
agree

## 2014-06-24 ENCOUNTER — Encounter (INDEPENDENT_AMBULATORY_CARE_PROVIDER_SITE_OTHER): Payer: Self-pay | Admitting: *Deleted

## 2014-06-28 ENCOUNTER — Ambulatory Visit (HOSPITAL_COMMUNITY)
Admission: RE | Admit: 2014-06-28 | Discharge: 2014-06-28 | Disposition: A | Discharge status: Home / Self Care | Payer: 59 | Source: Ambulatory Visit | Attending: Urology | Admitting: Urology

## 2014-06-28 ENCOUNTER — Other Ambulatory Visit: Payer: Self-pay | Admitting: Urology

## 2014-06-28 ENCOUNTER — Ambulatory Visit (INDEPENDENT_AMBULATORY_CARE_PROVIDER_SITE_OTHER): Payer: Self-pay | Admitting: Urology

## 2014-06-28 DIAGNOSIS — Z9889 Other specified postprocedural states: Secondary | ICD-10-CM | POA: Insufficient documentation

## 2014-06-28 DIAGNOSIS — N200 Calculus of kidney: Secondary | ICD-10-CM | POA: Diagnosis not present

## 2014-07-01 ENCOUNTER — Other Ambulatory Visit: Payer: Self-pay | Admitting: Urology

## 2014-07-05 ENCOUNTER — Other Ambulatory Visit (HOSPITAL_COMMUNITY): Payer: Self-pay | Admitting: Oncology

## 2014-07-05 DIAGNOSIS — Z1509 Genetic susceptibility to other malignant neoplasm: Secondary | ICD-10-CM

## 2014-07-05 DIAGNOSIS — R197 Diarrhea, unspecified: Secondary | ICD-10-CM

## 2014-07-05 DIAGNOSIS — Z85038 Personal history of other malignant neoplasm of large intestine: Secondary | ICD-10-CM

## 2014-07-05 MED ORDER — DIPHENOXYLATE-ATROPINE 2.5-0.025 MG PO TABS: 2.0000 | ORAL_TABLET | Freq: Three times a day (TID) | ORAL | Status: DC

## 2014-07-08 ENCOUNTER — Ambulatory Visit (HOSPITAL_COMMUNITY)
Admission: RE | Admit: 2014-07-08 | Discharge: 2014-07-08 | Disposition: A | Discharge status: Home / Self Care | Payer: 59 | Source: Ambulatory Visit | Attending: Internal Medicine | Admitting: Internal Medicine

## 2014-07-08 ENCOUNTER — Encounter (HOSPITAL_COMMUNITY)
Admission: RE | Disposition: A | Discharge status: Home / Self Care | Payer: Self-pay | Source: Ambulatory Visit | Attending: Internal Medicine

## 2014-07-08 ENCOUNTER — Encounter (HOSPITAL_COMMUNITY): Payer: Self-pay

## 2014-07-08 DIAGNOSIS — Z79899 Other long term (current) drug therapy: Secondary | ICD-10-CM | POA: Diagnosis not present

## 2014-07-08 DIAGNOSIS — Z85528 Personal history of other malignant neoplasm of kidney: Secondary | ICD-10-CM | POA: Diagnosis not present

## 2014-07-08 DIAGNOSIS — Z9049 Acquired absence of other specified parts of digestive tract: Secondary | ICD-10-CM | POA: Diagnosis not present

## 2014-07-08 DIAGNOSIS — Z85038 Personal history of other malignant neoplasm of large intestine: Secondary | ICD-10-CM | POA: Diagnosis not present

## 2014-07-08 DIAGNOSIS — Z8572 Personal history of non-Hodgkin lymphomas: Secondary | ICD-10-CM | POA: Insufficient documentation

## 2014-07-08 DIAGNOSIS — N209 Urinary calculus, unspecified: Secondary | ICD-10-CM | POA: Diagnosis not present

## 2014-07-08 DIAGNOSIS — Z809 Family history of malignant neoplasm, unspecified: Secondary | ICD-10-CM | POA: Diagnosis not present

## 2014-07-08 DIAGNOSIS — Z08 Encounter for follow-up examination after completed treatment for malignant neoplasm: Secondary | ICD-10-CM | POA: Insufficient documentation

## 2014-07-08 DIAGNOSIS — E538 Deficiency of other specified B group vitamins: Secondary | ICD-10-CM | POA: Insufficient documentation

## 2014-07-08 DIAGNOSIS — M13819 Other specified arthritis, unspecified shoulder: Secondary | ICD-10-CM | POA: Insufficient documentation

## 2014-07-08 DIAGNOSIS — Z79891 Long term (current) use of opiate analgesic: Secondary | ICD-10-CM | POA: Diagnosis not present

## 2014-07-08 DIAGNOSIS — Z905 Acquired absence of kidney: Secondary | ICD-10-CM | POA: Diagnosis not present

## 2014-07-08 DIAGNOSIS — Z792 Long term (current) use of antibiotics: Secondary | ICD-10-CM | POA: Diagnosis not present

## 2014-07-08 DIAGNOSIS — Z791 Long term (current) use of non-steroidal anti-inflammatories (NSAID): Secondary | ICD-10-CM | POA: Diagnosis not present

## 2014-07-08 DIAGNOSIS — M8580 Other specified disorders of bone density and structure, unspecified site: Secondary | ICD-10-CM | POA: Insufficient documentation

## 2014-07-08 DIAGNOSIS — Z1509 Genetic susceptibility to other malignant neoplasm: Secondary | ICD-10-CM | POA: Diagnosis not present

## 2014-07-08 HISTORY — PX: FLEXIBLE SIGMOIDOSCOPY: SHX5431

## 2014-07-08 SURGERY — SIGMOIDOSCOPY, FLEXIBLE
Anesthesia: Moderate Sedation

## 2014-07-08 MED ORDER — STERILE WATER FOR IRRIGATION IR SOLN
Status: DC | PRN
  Administered 2014-07-08: 11:00:00

## 2014-07-08 NOTE — Op Note (Signed)
FLEXIBLE SIGMOIDOSCOPY PROCEDURE REPORT  PATIENT:  Nicole [name]  MR#:  [id number] Birthdate:  06-18-1950, 64 y.o., female Endoscopist:  Dr. Rogene Houston, MD Referred By:  Dr. [name], MD  Procedure Date: 07/08/2014  Procedure:   Flexible sigmoidoscopy  Indications:  Patient is 64 year old Caucasian female with history of CRC and then syndrome who had subtotal colectomy. She is returning for surveillance sigmoidoscopy. Last exam was 1 year ago. She is doing well other than having problems with urolithiasis.  Informed Consent:  The procedure and risks were reviewed with the patient and informed consent was obtained.  Medications:  None  Description of procedure:  After a digital rectal exam was performed, that colonoscope was advanced from the anus through the rectum and colon to the area of the cecum, ileocecal valve and appendiceal orifice. The cecum was deeply intubated. These structures were well-seen and photographed for the record. From the level of the cecum and ileocecal valve, the scope was slowly and cautiously withdrawn. The mucosal surfaces were carefully surveyed utilizing scope tip to flexion to facilitate fold flattening as needed. The scope was pulled down into the rectum where a thorough exam including retroflexion was performed.  Findings:   Prep excellent. Normal mucosa of distal small bowel. Normal appearing ileocolonic anastomosis. Normal rim of distal sigmoid colon and normal rectal mucosa. Unremarkable anorectal junction.   Therapeutic/Diagnostic Maneuvers Performed:   None  Complications:  None   Impression:  Normal flexible sigmoidoscopy.  Recommendations:  Standard instructions given. Next exam in one year.  Nicole [name]  07/08/2014 10:55 AM  CC: Dr. [name], MD & Dr. Rayne Du ref. provider found

## 2014-07-08 NOTE — H&P (Signed)
Nicole [name] is an 64 y.o. female.   Chief Complaint: Patient's here for flexible sigmoidoscopy. HPI: Patient is 64 year old Caucasian female with history of Lynch syndrome and colon carcinoma who had subtotal colectomy few years ago. She opted to keep her rectum in situ and is undergoing surveillance exam. Last exam was in April 2015.  Past Medical History  Diagnosis Date  . B12 deficiency   . Right shoulder pain S/P ROTATOR CUFF REPAIR  . Chronic diarrhea SECONDARY HX COLON CANCER  . Personal history of renal cancer S/P LEFT PARTIAL NEPHRECTOMY 2007--  NO RECURRENCE    DUE TO KIDNEY CANCER  . History of colon cancer [facility] SYNDROME ---  COLORECTAL CANCER S/P COLECTOMY X3  LAST ONE 2007    NO RECURRENCE  . Arthritis     shorulder  . History of kidney stones   . History of colon cancer 05/04/2013  . History of MRSA infection 08/2013    left leg  . History of kidney infection 08/2013  . Hx of osteopenia   . Diarrhea   . Kidney stone   . Lynch syndrome 10/17/2010    Hereditary colon cancer  . NHL (non-Hodgkin's lymphoma) 10/17/2010    MONITORED BY DR Tressie Stalker    Past Surgical History  Procedure Laterality Date  . Hemicolectomy  2001    right  . Hemicolectomy  2002    left  . Kidney resection  2007    left partial  . Subtotal colectomy  2007  . Flexible sigmoidoscopy  08/29/2011    Procedure: FLEXIBLE SIGMOIDOSCOPY;  Surgeon: Rogene Houston, MD;  [facility];  Service: Endoscopy;  Laterality: N/A;  930  . Colonoscopy w/ polypectomy    . Left flank exploration w/  partial left nephrectomy/ left hilar lymph node bx  04-17-2005  DR DAHLSTEDT    RENAL CELL CARCINOMA  . Laparoscopic assisted vaginal hysterectomy  1993    W/ BILATERAL SALPINGO-OOPHORECTOMY  . Right rotator  cuff repair/   bicep repair  10-17-2011  DR SUPPLE  . Appendectomy  1993    W/ LAVH  . Cystoscopy with stent placement  02/07/2012    Procedure: CYSTOSCOPY WITH STENT PLACEMENT;  Surgeon: Franchot Gallo, MD;  Location: [facility];  Service: Urology;  Laterality: Left;  . Lithotripsy  11-22013    x2  . Flexible sigmoidoscopy N/A 09/28/2012    Procedure: FLEXIBLE SIGMOIDOSCOPY;  Surgeon: Rogene Houston, MD;  [facility]: [facility];  Service: Endoscopy;  Laterality: N/A;  730  . Nephrolithotomy Left 06/09/2014    Procedure: NEPHROLITHOTOMY PERCUTANEOUS;  Surgeon: Franchot Gallo, MD;  Location: [facility];  Service: Urology;  Laterality: Left;  with STENT    Family History  Problem Relation Age of Onset  . Cancer Son    Social History:  reports that she has never smoked. She has never used smokeless tobacco. She reports that she does not drink alcohol or use illicit drugs.  Allergies:  Allergies  Allergen Reactions  . Latex Rash  . Morphine And Related Other (See Comments)    hallucinations  . Tape Rash    Certain tapes     Medications Prior to Admission  Medication Sig Dispense Refill  . acetaminophen (TYLENOL) 650 MG CR tablet Take 1,300 mg by mouth every 8 (eight) hours as needed for pain.    Marland Kitchen atorvastatin (LIPITOR) 40 MG tablet Take 40 mg by mouth daily.    . cyanocobalamin (,VITAMIN B-12,) 1000 MCG/ML injection  Inject 1 mL (1,000 mcg total) into the muscle once. (Patient taking differently: Inject 1,000 mcg into the muscle every 30 (thirty) days. ) 1 mL 11  . diphenoxylate-atropine (LOMOTIL) 2.5-0.025 MG per tablet Take 2 tablets by mouth 3 (three) times daily before meals. 180 tablet 11  . ibuprofen (ADVIL,MOTRIN) 200 MG tablet Take 600 mg by mouth every 6 (six) hours as needed (Pain).     Marland Kitchen oxyCODONE (OXY IR/ROXICODONE) 5 MG immediate release tablet Take 1 tablet (5 mg total) by mouth every 4 (four) hours as needed for moderate pain. 30 tablet 0  . ciprofloxacin (CIPRO) 250 MG tablet Take 1 tablet (250 mg total) by mouth 2 (two) times daily. (Patient not taking: Reported on 06/24/2014) 6 tablet 0  . oxybutynin (DITROPAN) 5 MG tablet Take 1 tablet (5 mg  total) by mouth every 8 (eight) hours as needed for bladder spasms. 30 tablet 0    No results found for this or any previous visit (from the past 48 hour(s)). No results found.  ROS  Blood pressure 139/78, pulse 82, temperature 98.6 F (37 C), temperature source Oral, resp. rate 16, height 5\' 1"  (1.549 m), weight 187 lb (84.823 kg), SpO2 98 %. Physical Exam  Constitutional: She appears well-developed and well-nourished.  HENT:  Mouth/Throat: Oropharynx is clear and moist.  Eyes: Conjunctivae are normal. No scleral icterus.  Neck: No thyromegaly present.  Cardiovascular: Normal rate, regular rhythm and normal heart sounds.   No murmur heard. Respiratory: Effort normal and breath sounds normal.  GI: Soft. She exhibits no distension and no mass. There is no tenderness.  Lower midline scar  Musculoskeletal: She exhibits no edema.  Lymphadenopathy:    She has no cervical adenopathy.  Neurological: She is alert.  Skin: Skin is warm and dry.     Assessment/Plan History of colon carcinoma. Lynch syndrome. Flexible sigmoidoscopy for surveillance.  REHMAN,NAJEEB U 07/08/2014, 10:41 AM

## 2014-07-08 NOTE — Discharge Instructions (Signed)
Resume usual medications and diet. Next flexible sigmoidoscopy in one year.       Flexible Sigmoidoscopy, Care After Refer to this sheet in the next few weeks. These instructions provide you with information on caring for yourself after your procedure. Your health care provider may also give you more specific instructions. Your treatment has been planned according to current medical practices, but problems sometimes occur. Call your health care provider if you have any problems or questions after your procedure. WHAT TO EXPECT AFTER THE PROCEDURE After your procedure, it is typical to have the following:   Abdominal cramps.  Bloating.  A small amount of rectal bleeding if you had a biopsy. HOME CARE INSTRUCTIONS  Only take over-the-counter or prescription medicines for pain, fever, or discomfort as directed by your health care provider.  Resume your normal diet and activities as directed by your health care provider. SEEK MEDICAL CARE IF:  You have abdominal pain or cramping that lasts longer than 1 hour after the procedure.  You continue to have small amounts of rectal bleeding after 24 hours.  You have nausea or vomiting.  You feel weak or dizzy. SEEK IMMEDIATE MEDICAL CARE IF:   You have a fever.  You pass large blood clots or see a large amount of blood in the toilet after having a bowel movement. This may also occur 10-14 days after the procedure. It is more likely if you had a biopsy.  You develop abdominal pain that is not relieved with medicine or your abdominal pain gets worse.  You have nausea or vomiting for more than 24 hours after the procedure.

## 2014-07-08 NOTE — Op Note (Signed)
Pt request no IV, No medication

## 2014-07-11 ENCOUNTER — Encounter (HOSPITAL_COMMUNITY): Payer: Self-pay | Admitting: Internal Medicine

## 2014-07-11 ENCOUNTER — Ambulatory Visit (HOSPITAL_COMMUNITY): Payer: BC Managed Care – PPO

## 2014-07-12 ENCOUNTER — Encounter (HOSPITAL_BASED_OUTPATIENT_CLINIC_OR_DEPARTMENT_OTHER): Payer: Self-pay | Admitting: *Deleted

## 2014-07-12 NOTE — Progress Notes (Addendum)
To [facility] at 1000-Hg,Ekg  on arrival.Instructed Npo after Mn-may take pain med with small water as needed.

## 2014-07-13 NOTE — H&P (Signed)
H&P  Chief Complaint: kidney stones  History of Present Illness: Nicole [name] is a 64 y.o. year old female who underwent recent left percutaneous nephrolithotomy for very large left renal stone burden.  She has 2 remaining upper/interpolar renal calculi, each proximally 6 mm in size.  These were unable to be accessed through either of the 2 percutaneous access sites.  Additionally, she has a very large lower pole stone which I could not access adequately with either of those 2 access sites.  She presents at this time for ureteroscopic management.  Past Medical History  Diagnosis Date  . B12 deficiency   . Right shoulder pain S/P ROTATOR CUFF REPAIR  . Chronic diarrhea SECONDARY HX COLON CANCER  . Personal history of renal cancer S/P LEFT PARTIAL NEPHRECTOMY 2007--  NO RECURRENCE    DUE TO KIDNEY CANCER  . History of colon cancer [facility] SYNDROME ---  COLORECTAL CANCER S/P COLECTOMY X3  LAST ONE 2007    NO RECURRENCE  . Arthritis     shorulder  . History of kidney stones   . History of colon cancer 05/04/2013  . History of MRSA infection 08/2013    left leg  . History of kidney infection 08/2013  . Hx of osteopenia   . Diarrhea   . Kidney stone   . Lynch syndrome 10/17/2010    Hereditary colon cancer  . NHL (non-Hodgkin's lymphoma) 10/17/2010    MONITORED BY DR Tressie Stalker    Past Surgical History  Procedure Laterality Date  . Hemicolectomy  2001    right  . Hemicolectomy  2002    left  . Kidney resection  2007    left partial  . Subtotal colectomy  2007  . Flexible sigmoidoscopy  08/29/2011    Procedure: FLEXIBLE SIGMOIDOSCOPY;  Surgeon: Rogene Houston, MD;  [facility];  Service: Endoscopy;  Laterality: N/A;  930  . Colonoscopy w/ polypectomy    . Left flank exploration w/  partial left nephrectomy/ left hilar lymph node bx  04-17-2005  DR Ambers Iyengar    RENAL CELL CARCINOMA  . Laparoscopic assisted vaginal hysterectomy  1993    W/ BILATERAL SALPINGO-OOPHORECTOMY  .  Right rotator  cuff repair/   bicep repair  10-17-2011  DR SUPPLE  . Appendectomy  1993    W/ LAVH  . Cystoscopy with stent placement  02/07/2012    Procedure: CYSTOSCOPY WITH STENT PLACEMENT;  Surgeon: Franchot Gallo, MD;  Location: [facility];  Service: Urology;  Laterality: Left;  . Lithotripsy  11-22013    x2  . Flexible sigmoidoscopy N/A 09/28/2012    Procedure: FLEXIBLE SIGMOIDOSCOPY;  Surgeon: Rogene Houston, MD;  [facility];  Service: Endoscopy;  Laterality: N/A;  730  . Nephrolithotomy Left 06/09/2014    Procedure: NEPHROLITHOTOMY PERCUTANEOUS;  Surgeon: Franchot Gallo, MD;  Location: [facility];  Service: Urology;  Laterality: Left;  with STENT  . Flexible sigmoidoscopy N/A 07/08/2014    Procedure: FLEXIBLE SIGMOIDOSCOPY;  Surgeon: Rogene Houston, MD;  [facility];  Service: Endoscopy;  Laterality: N/A;  830 - moved to 10:20 - Ann to notify pt    Home Medications:  No prescriptions prior to admission    Allergies:  Allergies  Allergen Reactions  . Latex Rash  . Morphine And Related Other (See Comments)    hallucinations  . Tape Rash    Certain tapes     Family History  Problem Relation Age of Onset  .  Cancer Son     Social History:  reports that she has never smoked. She has never used smokeless tobacco. She reports that she does not drink alcohol or use illicit drugs.  ROS: A complete review of systems was performed.  All systems are negative except for pertinent findings as noted.she does have urinary frequency and urgency secondary to stent  Physical Exam:  Vital signs in last 24 hours: Weight:  [187 lb (84.823 kg)] 187 lb (84.823 kg) (04/26 1651) General:  Alert and oriented, No acute distress HEENT: Normocephalic, atraumatic Neck: No JVD or lymphadenopathy Cardiovascular: Regular rate and rhythm Lungs: Clear bilaterally Abdomen: Soft, nontender, nondistended, no abdominal masses Back: No CVA  tenderness Extremities: No edema Neurologic: Grossly intact  Laboratory Data:  No results found for this or any previous visit (from the past 24 hour(s)). No results found for this or any previous visit (from the past 240 hour(s)). Creatinine: No results for input(s): CREATININE in the last 168 hours.  Radiologic Imaging: No results found.  Impression/Assessment:  Left renal calculi status post percutaneous management of large stone burden with persistent stones  Plan:  Ureteroscopic management with holmium laser of left renal calculi.  Jorja Loa 07/13/2014, 9:16 AM  Lillette Boxer. Docie Abramovich MD

## 2014-07-14 ENCOUNTER — Encounter (HOSPITAL_BASED_OUTPATIENT_CLINIC_OR_DEPARTMENT_OTHER): Payer: Self-pay | Admitting: *Deleted

## 2014-07-14 ENCOUNTER — Ambulatory Visit (HOSPITAL_BASED_OUTPATIENT_CLINIC_OR_DEPARTMENT_OTHER)
Admission: RE | Admit: 2014-07-14 | Discharge: 2014-07-14 | Disposition: A | Discharge status: Home / Self Care | Payer: 59 | Source: Ambulatory Visit | Attending: Urology | Admitting: Urology

## 2014-07-14 ENCOUNTER — Ambulatory Visit (HOSPITAL_BASED_OUTPATIENT_CLINIC_OR_DEPARTMENT_OTHER): Payer: 59 | Admitting: Anesthesiology

## 2014-07-14 ENCOUNTER — Encounter (HOSPITAL_BASED_OUTPATIENT_CLINIC_OR_DEPARTMENT_OTHER)
Admission: RE | Disposition: A | Discharge status: Home / Self Care | Payer: Self-pay | Source: Ambulatory Visit | Attending: Urology

## 2014-07-14 ENCOUNTER — Other Ambulatory Visit: Payer: Self-pay

## 2014-07-14 DIAGNOSIS — Z6835 Body mass index (BMI) 35.0-35.9, adult: Secondary | ICD-10-CM | POA: Diagnosis not present

## 2014-07-14 DIAGNOSIS — Z85528 Personal history of other malignant neoplasm of kidney: Secondary | ICD-10-CM | POA: Insufficient documentation

## 2014-07-14 DIAGNOSIS — Z9049 Acquired absence of other specified parts of digestive tract: Secondary | ICD-10-CM | POA: Insufficient documentation

## 2014-07-14 DIAGNOSIS — Z886 Allergy status to analgesic agent status: Secondary | ICD-10-CM | POA: Diagnosis not present

## 2014-07-14 DIAGNOSIS — N200 Calculus of kidney: Secondary | ICD-10-CM | POA: Insufficient documentation

## 2014-07-14 DIAGNOSIS — Z85038 Personal history of other malignant neoplasm of large intestine: Secondary | ICD-10-CM | POA: Insufficient documentation

## 2014-07-14 DIAGNOSIS — M1990 Unspecified osteoarthritis, unspecified site: Secondary | ICD-10-CM | POA: Diagnosis not present

## 2014-07-14 DIAGNOSIS — E669 Obesity, unspecified: Secondary | ICD-10-CM | POA: Diagnosis not present

## 2014-07-14 DIAGNOSIS — Z888 Allergy status to other drugs, medicaments and biological substances status: Secondary | ICD-10-CM | POA: Diagnosis not present

## 2014-07-14 DIAGNOSIS — Z91040 Latex allergy status: Secondary | ICD-10-CM | POA: Diagnosis not present

## 2014-07-14 DIAGNOSIS — Z90710 Acquired absence of both cervix and uterus: Secondary | ICD-10-CM | POA: Diagnosis not present

## 2014-07-14 HISTORY — PX: CYSTOSCOPY W/ RETROGRADES: SHX1426

## 2014-07-14 HISTORY — PX: HOLMIUM LASER APPLICATION: SHX5852

## 2014-07-14 HISTORY — PX: CYSTOSCOPY W/ URETERAL STENT REMOVAL: SHX1430

## 2014-07-14 HISTORY — PX: CYSTOSCOPY WITH STENT PLACEMENT: SHX5790

## 2014-07-14 HISTORY — PX: URETEROSCOPY WITH HOLMIUM LASER LITHOTRIPSY: SHX6645

## 2014-07-14 LAB — POCT HEMOGLOBIN-HEMACUE: Hemoglobin: 12.9 g/dL (ref 12.0–15.0)

## 2014-07-14 SURGERY — URETEROSCOPY, WITH LITHOTRIPSY USING HOLMIUM LASER
Anesthesia: General | Site: Ureter | Laterality: Left

## 2014-07-14 MED ORDER — MIDAZOLAM HCL 2 MG/2ML IJ SOLN
INTRAMUSCULAR | Status: AC
  Filled 2014-07-14: qty 2

## 2014-07-14 MED ORDER — CEPHALEXIN 500 MG PO CAPS: 500.0000 mg | ORAL_CAPSULE | Freq: Four times a day (QID) | ORAL | Status: DC

## 2014-07-14 MED ORDER — FENTANYL CITRATE (PF) 100 MCG/2ML IJ SOLN
25.0000 ug | INTRAMUSCULAR | Status: DC | PRN
  Filled 2014-07-14: qty 1

## 2014-07-14 MED ORDER — ONDANSETRON HCL 4 MG/2ML IJ SOLN
INTRAMUSCULAR | Status: DC | PRN
  Administered 2014-07-14: 4 mg via INTRAVENOUS

## 2014-07-14 MED ORDER — DEXAMETHASONE SODIUM PHOSPHATE 4 MG/ML IJ SOLN
INTRAMUSCULAR | Status: DC | PRN
  Administered 2014-07-14: 10 mg via INTRAVENOUS

## 2014-07-14 MED ORDER — MIDAZOLAM HCL 5 MG/5ML IJ SOLN
INTRAMUSCULAR | Status: DC | PRN
  Administered 2014-07-14: 2 mg via INTRAVENOUS

## 2014-07-14 MED ORDER — IOHEXOL 350 MG/ML SOLN
INTRAVENOUS | Status: DC | PRN
  Administered 2014-07-14: 10 mL

## 2014-07-14 MED ORDER — CEFAZOLIN SODIUM 1-5 GM-% IV SOLN
1.0000 g | INTRAVENOUS | Status: DC
  Filled 2014-07-14: qty 50

## 2014-07-14 MED ORDER — FENTANYL CITRATE (PF) 100 MCG/2ML IJ SOLN
INTRAMUSCULAR | Status: DC | PRN
Start: 2014-07-14 — End: 2014-07-14
  Administered 2014-07-14 (×3): 50 ug via INTRAVENOUS

## 2014-07-14 MED ORDER — SODIUM CHLORIDE 0.9 % IR SOLN
Status: DC | PRN
  Administered 2014-07-14: 4000 mL
  Administered 2014-07-14: 3000 mL

## 2014-07-14 MED ORDER — CEFAZOLIN SODIUM-DEXTROSE 2-3 GM-% IV SOLR
INTRAVENOUS | Status: AC
  Filled 2014-07-14: qty 50

## 2014-07-14 MED ORDER — PROPOFOL 10 MG/ML IV BOLUS
INTRAVENOUS | Status: DC | PRN
  Administered 2014-07-14: 150 mg via INTRAVENOUS
  Administered 2014-07-14: 50 mg via INTRAVENOUS

## 2014-07-14 MED ORDER — FENTANYL CITRATE (PF) 100 MCG/2ML IJ SOLN
INTRAMUSCULAR | Status: AC
  Filled 2014-07-14: qty 4

## 2014-07-14 MED ORDER — ACETAMINOPHEN 10 MG/ML IV SOLN
INTRAVENOUS | Status: DC | PRN
  Administered 2014-07-14: 1000 mg via INTRAVENOUS

## 2014-07-14 MED ORDER — LIDOCAINE HCL (CARDIAC) 20 MG/ML IV SOLN
INTRAVENOUS | Status: DC | PRN
  Administered 2014-07-14: 60 mg via INTRAVENOUS

## 2014-07-14 MED ORDER — PHENYLEPHRINE HCL 10 MG/ML IJ SOLN
INTRAMUSCULAR | Status: DC | PRN
  Administered 2014-07-14: 40 ug via INTRAVENOUS
  Administered 2014-07-14: 80 ug via INTRAVENOUS
  Administered 2014-07-14: 40 ug via INTRAVENOUS

## 2014-07-14 MED ORDER — LACTATED RINGERS IV SOLN
INTRAVENOUS | Status: DC
  Administered 2014-07-14 (×2): via INTRAVENOUS
  Filled 2014-07-14: qty 1000

## 2014-07-14 MED ORDER — CEFAZOLIN SODIUM-DEXTROSE 2-3 GM-% IV SOLR
2.0000 g | INTRAVENOUS | Status: AC
  Administered 2014-07-14: 2 g via INTRAVENOUS
  Filled 2014-07-14: qty 50

## 2014-07-14 MED ORDER — EPHEDRINE SULFATE 50 MG/ML IJ SOLN
INTRAMUSCULAR | Status: DC | PRN
  Administered 2014-07-14: 15 mg via INTRAVENOUS
  Administered 2014-07-14: 10 mg via INTRAVENOUS

## 2014-07-14 SURGICAL SUPPLY — 24 items
BAG DRAIN URO-CYSTO SKYTR STRL (DRAIN) ×3 IMPLANT
BAG DRN UROCATH (DRAIN) ×2
BASKET STONE 1.7 NGAGE (UROLOGICAL SUPPLIES) ×3 IMPLANT
BASKET ZERO TIP NITINOL 2.4FR (BASKET) ×3 IMPLANT
BSKT STON RTRVL ZERO TP 2.4FR (BASKET) ×2
CANISTER SUCT LVC 12 LTR MEDI- (MISCELLANEOUS) ×3 IMPLANT
CATH INTERMIT  6FR 70CM (CATHETERS) IMPLANT
CLOTH BEACON ORANGE TIMEOUT ST (SAFETY) ×3 IMPLANT
FIBER LASER TRAC TIP (UROLOGICAL SUPPLIES) ×3 IMPLANT
GLOVE BIOGEL PI IND STRL 7.0 (GLOVE) ×2 IMPLANT
GLOVE BIOGEL PI INDICATOR 7.0 (GLOVE) ×1
GLOVE SKINSENSE NS SZ7.0 (GLOVE) ×1
GLOVE SKINSENSE STRL SZ7.0 (GLOVE) ×2 IMPLANT
GLOVE SURG SS PI 7.5 STRL IVOR (GLOVE) ×9 IMPLANT
GLOVE SURG SS PI 8.0 STRL IVOR (GLOVE) ×3 IMPLANT
GOWN STRL REUS W/TWL XL LVL3 (GOWN DISPOSABLE) ×9 IMPLANT
GUIDEWIRE ANG ZIPWIRE 038X150 (WIRE) IMPLANT
GUIDEWIRE STR DUAL SENSOR (WIRE) ×3 IMPLANT
IV NS IRRIG 3000ML ARTHROMATIC (IV SOLUTION) ×6 IMPLANT
NS IRRIG 500ML POUR BTL (IV SOLUTION) ×3 IMPLANT
PACK CYSTO (CUSTOM PROCEDURE TRAY) ×3 IMPLANT
SET IRRIG Y TYPE TUR BLADDER L (SET/KITS/TRAYS/PACK) ×3 IMPLANT
SHEATH ACCESS URETERAL 38CM (SHEATH) ×3 IMPLANT
STENT URET 6FRX24 CONTOUR (STENTS) ×3 IMPLANT

## 2014-07-14 NOTE — Anesthesia Preprocedure Evaluation (Addendum)
Anesthesia Evaluation  Patient identified by MRN, date of birth, ID band Patient awake    Reviewed: Allergy & Precautions, H&P , NPO status , Patient's Chart, lab work & pertinent test results  Airway Mallampati: II  TM Distance: >3 FB Neck ROM: Full    Dental no notable dental hx. (+) Teeth Intact, Dental Advisory Given   Pulmonary neg pulmonary ROS,  breath sounds clear to auscultation  Pulmonary exam normal       Cardiovascular negative cardio ROS  Rhythm:Regular Rate:Normal     Neuro/Psych negative neurological ROS  negative psych ROS   GI/Hepatic negative GI ROS, Neg liver ROS, H/o colon CA   Endo/Other  negative endocrine ROS  Renal/GU Renal disease  negative genitourinary   Musculoskeletal  (+) Arthritis -, Osteoarthritis,    Abdominal   Peds  Hematology negative hematology ROS (+)   Anesthesia Other Findings   Reproductive/Obstetrics negative OB ROS                            Anesthesia Physical Anesthesia Plan  ASA: II  Anesthesia Plan: General   Post-op Pain Management:    Induction: Intravenous  Airway Management Planned: LMA  Additional Equipment:   Intra-op Plan:   Post-operative Plan: Extubation in OR  Informed Consent: I have reviewed the patients History and Physical, chart, labs and discussed the procedure including the risks, benefits and alternatives for the proposed anesthesia with the patient or authorized representative who has indicated his/her understanding and acceptance.   Dental advisory given  Plan Discussed with: CRNA  Anesthesia Plan Comments:         Anesthesia Quick Evaluation

## 2014-07-14 NOTE — Anesthesia Postprocedure Evaluation (Signed)
  Anesthesia Post-op Note  Patient: Nicole [name]  Procedure(s) Performed: Procedure(s) (LRB): URETEROSCOPY  EXTRACTION OF STONES (Left) HOLMIUM LASER LITHOTRIPSY, (Left) CYSTOSCOPY WITH STENT REMOVAL (Left) CYSTOSCOPY WITH STENT PLACEMENT (Left) CYSTOSCOPY WITH RETROGRADE PYELOGRAM  Patient Location: PACU  Anesthesia Type: General  Level of Consciousness: awake and alert   Airway and Oxygen Therapy: Patient Spontanous Breathing  Post-op Pain: mild  Post-op Assessment: Post-op Vital signs reviewed, Patient's Cardiovascular Status Stable, Respiratory Function Stable, Patent Airway and No signs of Nausea or vomiting  Last Vitals:  Filed Vitals:   07/14/14 1510  BP: 128/98  Pulse: 102  Temp: 37.1 C  Resp: 16    Post-op Vital Signs: stable   Complications: No apparent anesthesia complications

## 2014-07-14 NOTE — Discharge Instructions (Signed)
POSTOPERATIVE CARE AFTER URETEROSCOPY  Stent management  *Stents are often left in after ureteroscopy and stone treatment. If left in, they often cause urinary frequency, urgency, occasional blood in the urine, as well as flank discomfort with urination. These are all expected issues, and should resolve after the stent is removed. *Often times, a small thread is left on the end of the stent, and brought out through the urethra. If so, this is used to remove the stent, making it unnecessary to look in the bladder with a scope in the office to remove the stent. If a thread is left on, did not pull on it until instructed. It is okay to remove the stent on Monday morning.  Diet  Once you have adequately recovered from anesthesia, you may gradually advance your diet, as tolerated, to your regular diet.  Activities  You may gradually increase your activities to your normal unrestricted level the day following your procedure.  Medications  You should resume all preoperative medications. If you are on aspirin-like compounds, you should not resume these until the blood clears from your urine. If given an antibiotic by the surgeon, take these until they are completed. You may also be given, if you have a stent, medications to decrease the urinary frequency and urgency.  Pain  After ureteroscopy, there may be some pain on the side of the scope. Take your pain medicine for this. Usually, this pain resolves within a day or 2.  Fever  Please report any fever over 100 to the doctor.      Post Anesthesia Home Care Instructions  Activity: Get plenty of rest for the remainder of the day. A responsible adult should stay with you for 24 hours following the procedure.  For the next 24 hours, DO NOT: -Drive a car -Paediatric nurse -Drink alcoholic beverages -Take any medication unless instructed by your physician -Make any legal decisions or sign important papers.  Meals: Start with liquid  foods such as gelatin or soup. Progress to regular foods as tolerated. Avoid greasy, spicy, heavy foods. If nausea and/or vomiting occur, drink only clear liquids until the nausea and/or vomiting subsides. Call your physician if vomiting continues.  Special Instructions/Symptoms: Your throat may feel dry or sore from the anesthesia or the breathing tube placed in your throat during surgery. If this causes discomfort, gargle with warm salt water. The discomfort should disappear within 24 hours.  If you had a scopolamine patch placed behind your ear for the management of post- operative nausea and/or vomiting:  1. The medication in the patch is effective for 72 hours, after which it should be removed.  Wrap patch in a tissue and discard in the trash. Wash hands thoroughly with soap and water. 2. You may remove the patch earlier than 72 hours if you experience unpleasant side effects which may include dry mouth, dizziness or visual disturbances. 3. Avoid touching the patch. Wash your hands with soap and water after contact with the patch.

## 2014-07-14 NOTE — Op Note (Signed)
Preoperative diagnosis: left renal calculi  Postoperative diagnosis: same  Procedure:  1. Cystoscopy 2. left ureteroscopy and stone removal 3. Ureteroscopic laser lithotripsy 4. left ureteral stent placement (24 cm x 6 French contour with string) 5. left retrograde pyelography with interpretation  Surgeon: Lillette Boxer. Amaro Mangold,  M.D.  Anesthesia: General  Complications: None  Intraoperative findings: left retrograde pyelography demonstrated and intact pyelo-calyceal system. No specific filling defects were seen consistent with calculi.  EBL: Minimal  Specimens: 1. Left renal calculi, to the patient's family  Disposition of specimens: [facility] for stone analysis  Indication: Nicole [name]  is a 64 y.o. patient with urolithiasis.she has had multiple recurrences, and is about a month out from left percutaneous nephrolithotomy. Because of difficult renal anatomy, 2-3 sizable stones were left in despite having 2 access points placed by interventional radiology. Patient has an indwelling ureteral stent.it is necessary to remove the remaining renal calculi.   After reviewing the management options for treatment, he elected to proceed with the above surgical procedure(s). We have discussed the potential benefits and risks of the procedure, side effects of the proposed treatment, the likelihood of the patient achieving the goals of the procedure, and any potential problems that might occur during the procedure or recuperation. Informed consent has been obtained.  Description of procedure:  The patient was taken to the operating room and general anesthesia was induced.  The patient was placed in the dorsal lithotomy position, prepped and draped in the usual sterile fashion, and preoperative antibiotics were administered. A preoperative time-out was performed.   Cystourethroscopy was performed.  The patient's urethra was examined and was normal. A double-J stent was  present at the left ureteral orifice.  The stent was grasped, and brought out the urethral meatus. It was encrusted. A sensor-tip guidewire was placed through the stent and guided into the left renal pelvis using fluoroscopy. The stent was removed over top of the guidewire. I then passed a 12/14 [place] ureteral access sheath (35 cm) up the left ureter using fluoroscopic guidance. The inner core was removed as well as a sensor-tip guidewire. I then passed the flexible digital dual channel ureteroscope proximally up into the left renal pelvis. The entire pyelocalyceal system was inspected. 2-3 stones were present in an interpolar calyx. There was another stone in the upper pole major calyx behind a fold of tissue, most likely secondary to prior percutaneous access or her partial nephrectomy. This was difficult to access. The ureteroscope was used to inspect every calyx. Smaller stones were snared either with the Nitinol basket or the engage basket. There were extracted through the access sheath. There were several lower pole calyceal stones. 2 large ones were brought through the lower pole infundibulum and into the upper pole calyx where there were fragmented with the laser. The 200 micron fiber was used to pass holmium laser energy at a rate of 10 Hz and at a power of 0.5 J. The stones were then fragmented and brought through the access sheath as well. Multiple lower pole stones were grasped, having inspected each of the calyces. During the procedure, retrograde passage of contrast was utilized to identify all calyces. There was a small calyx in the interpolar region with a stenotic infundibulum. This may well benefit access 0.4 the percutaneous procedure. I opened up the stenotic infundibulum with a laser energy. I passed the scope into the calyx. No stone was seen. The larger stone in the upper pole calyx which was difficult to access  was fragmented/sheared off of the urothelium with the laser. I then fragmented  in a couple smaller fragments were then extracted. I inspected all calyces at least one more time. Tiny fragments were snared if possible and removed. Tiny gr of sand were aspirated through the scope if possible. After inspecting al calyces and finding no significant fragments remaining, I terminated the procedure. The scope was removed. I passed the center tip guidewire through the ureteral access sheath. The access sheath was removed. Cystoscopically, over the guidewire, a 24 cm x 6 French contour double-J stent was placed, with the string left on. The bladder was drained. The scope was removed. The string was then taped to the patient's inner thigh with Tegaderm.  The patient was awakened and taken to the PACU in stable condition. She tolerated the procedure well.

## 2014-07-14 NOTE — Interval H&P Note (Signed)
I discussed procedure w/ pt--will proceed

## 2014-07-14 NOTE — Anesthesia Procedure Notes (Signed)
Procedure Name: LMA Insertion Date/Time: 07/14/2014 11:07 AM Performed by: Wanita Chamberlain Pre-anesthesia Checklist: Patient identified, Timeout performed, Emergency Drugs available, Suction available and Patient being monitored Patient Re-evaluated:Patient Re-evaluated prior to inductionOxygen Delivery Method: Circle system utilized Preoxygenation: Pre-oxygenation with 100% oxygen Intubation Type: IV induction Ventilation: Mask ventilation without difficulty LMA: LMA inserted LMA Size: 4.0 Number of attempts: 1 Placement Confirmation: breath sounds checked- equal and bilateral and positive ETCO2 Tube secured with: Tape Dental Injury: Teeth and Oropharynx as per pre-operative assessment

## 2014-07-14 NOTE — Transfer of Care (Signed)
Immediate Anesthesia Transfer of Care Note  Patient: Nicole [name]  Procedure(s) Performed: Procedure(s): URETEROSCOPY  EXTRACTION OF STONES (Left) HOLMIUM LASER LITHOTRIPSY, (Left) CYSTOSCOPY WITH STENT REMOVAL (Left) CYSTOSCOPY WITH STENT PLACEMENT (Left) CYSTOSCOPY WITH RETROGRADE PYELOGRAM  Patient Location: PACU  Anesthesia Type:General  Level of Consciousness: awake, alert , oriented and patient cooperative  Airway & Oxygen Therapy: Patient Spontanous Breathing and Patient connected to nasal cannula oxygen  Post-op Assessment: Report given to RN and Post -op Vital signs reviewed and stable  Post vital signs: Reviewed and stable  Last Vitals:  Filed Vitals:   07/14/14 0958  BP: 140/77  Pulse: 81  Temp: 36.7 C  Resp: 12    Complications: No apparent anesthesia complications

## 2014-07-15 ENCOUNTER — Encounter (HOSPITAL_BASED_OUTPATIENT_CLINIC_OR_DEPARTMENT_OTHER): Payer: Self-pay | Admitting: Urology

## 2014-08-08 ENCOUNTER — Ambulatory Visit (HOSPITAL_COMMUNITY): Payer: BC Managed Care – PPO

## 2014-08-16 ENCOUNTER — Ambulatory Visit (INDEPENDENT_AMBULATORY_CARE_PROVIDER_SITE_OTHER): Payer: Medicare Other | Admitting: Urology

## 2014-08-16 DIAGNOSIS — N200 Calculus of kidney: Secondary | ICD-10-CM | POA: Diagnosis not present

## 2014-09-05 ENCOUNTER — Ambulatory Visit (HOSPITAL_COMMUNITY): Payer: BC Managed Care – PPO

## 2014-09-05 ENCOUNTER — Other Ambulatory Visit (HOSPITAL_COMMUNITY): Payer: Self-pay

## 2014-09-06 ENCOUNTER — Other Ambulatory Visit (HOSPITAL_COMMUNITY): Payer: Self-pay | Admitting: Oncology

## 2014-09-06 DIAGNOSIS — E538 Deficiency of other specified B group vitamins: Secondary | ICD-10-CM

## 2014-09-06 DIAGNOSIS — Z85038 Personal history of other malignant neoplasm of large intestine: Secondary | ICD-10-CM

## 2014-09-06 DIAGNOSIS — C8590 Non-Hodgkin lymphoma, unspecified, unspecified site: Secondary | ICD-10-CM

## 2014-09-06 DIAGNOSIS — Z1509 Genetic susceptibility to other malignant neoplasm: Secondary | ICD-10-CM

## 2014-09-21 ENCOUNTER — Encounter (HOSPITAL_COMMUNITY): Payer: Medicare Other | Attending: Hematology & Oncology

## 2014-09-21 ENCOUNTER — Other Ambulatory Visit (HOSPITAL_COMMUNITY): Payer: Self-pay | Admitting: Oncology

## 2014-09-21 ENCOUNTER — Encounter (HOSPITAL_BASED_OUTPATIENT_CLINIC_OR_DEPARTMENT_OTHER): Payer: Medicare Other | Admitting: Oncology

## 2014-09-21 ENCOUNTER — Ambulatory Visit (HOSPITAL_COMMUNITY): Payer: 59 | Admitting: Oncology

## 2014-09-21 ENCOUNTER — Encounter (HOSPITAL_COMMUNITY): Payer: Self-pay | Admitting: Oncology

## 2014-09-21 VITALS — BP 165/89 | HR 82 | Temp 98.5°F | Resp 18 | Wt 188.0 lb

## 2014-09-21 DIAGNOSIS — Z85038 Personal history of other malignant neoplasm of large intestine: Secondary | ICD-10-CM

## 2014-09-21 DIAGNOSIS — Z8572 Personal history of non-Hodgkin lymphomas: Secondary | ICD-10-CM

## 2014-09-21 DIAGNOSIS — C642 Malignant neoplasm of left kidney, except renal pelvis: Secondary | ICD-10-CM

## 2014-09-21 DIAGNOSIS — Z808 Family history of malignant neoplasm of other organs or systems: Secondary | ICD-10-CM

## 2014-09-21 DIAGNOSIS — R197 Diarrhea, unspecified: Secondary | ICD-10-CM | POA: Insufficient documentation

## 2014-09-21 DIAGNOSIS — Z1509 Genetic susceptibility to other malignant neoplasm: Secondary | ICD-10-CM | POA: Diagnosis not present

## 2014-09-21 DIAGNOSIS — E538 Deficiency of other specified B group vitamins: Secondary | ICD-10-CM

## 2014-09-21 DIAGNOSIS — C8590 Non-Hodgkin lymphoma, unspecified, unspecified site: Secondary | ICD-10-CM | POA: Diagnosis not present

## 2014-09-21 DIAGNOSIS — C649 Malignant neoplasm of unspecified kidney, except renal pelvis: Secondary | ICD-10-CM | POA: Diagnosis not present

## 2014-09-21 DIAGNOSIS — Z8553 Personal history of malignant neoplasm of renal pelvis: Secondary | ICD-10-CM

## 2014-09-21 LAB — CBC WITH DIFFERENTIAL/PLATELET
Basophils Absolute: 0.1 10*3/uL (ref 0.0–0.1)
Basophils Relative: 1 % (ref 0–1)
EOS ABS: 0.2 10*3/uL (ref 0.0–0.7)
Eosinophils Relative: 2 % (ref 0–5)
HCT: 40.4 % (ref 36.0–46.0)
Hemoglobin: 13.2 g/dL (ref 12.0–15.0)
LYMPHS ABS: 1.8 10*3/uL (ref 0.7–4.0)
Lymphocytes Relative: 22 % (ref 12–46)
MCH: 29.3 pg (ref 26.0–34.0)
MCHC: 32.7 g/dL (ref 30.0–36.0)
MCV: 89.6 fL (ref 78.0–100.0)
MONOS PCT: 7 % (ref 3–12)
Monocytes Absolute: 0.6 10*3/uL (ref 0.1–1.0)
NEUTROS PCT: 68 % (ref 43–77)
Neutro Abs: 5.8 10*3/uL (ref 1.7–7.7)
Platelets: 176 10*3/uL (ref 150–400)
RBC: 4.51 MIL/uL (ref 3.87–5.11)
RDW: 13 % (ref 11.5–15.5)
WBC: 8.5 10*3/uL (ref 4.0–10.5)

## 2014-09-21 LAB — C-REACTIVE PROTEIN: CRP: 0.7 mg/dL (ref <1.0)

## 2014-09-21 LAB — COMPREHENSIVE METABOLIC PANEL
ALBUMIN: 3.8 g/dL (ref 3.5–5.0)
ALT: 17 U/L (ref 14–54)
ANION GAP: 8 (ref 5–15)
AST: 17 U/L (ref 15–41)
Alkaline Phosphatase: 82 U/L (ref 38–126)
BILIRUBIN TOTAL: 0.6 mg/dL (ref 0.3–1.2)
BUN: 21 mg/dL — AB (ref 6–20)
CHLORIDE: 106 mmol/L (ref 101–111)
CO2: 26 mmol/L (ref 22–32)
Calcium: 8.8 mg/dL — ABNORMAL LOW (ref 8.9–10.3)
Creatinine, Ser: 0.78 mg/dL (ref 0.44–1.00)
GFR calc Af Amer: >60 mL/min (ref >60)
GLUCOSE: 110 mg/dL — AB (ref 65–99)
POTASSIUM: 4 mmol/L (ref 3.5–5.1)
Sodium: 140 mmol/L (ref 135–145)
Total Protein: 6.9 g/dL (ref 6.5–8.1)

## 2014-09-21 LAB — LACTATE DEHYDROGENASE: LDH: 127 U/L (ref 98–192)

## 2014-09-21 LAB — SEDIMENTATION RATE: Sed Rate: 16 mm/hr (ref 0–22)

## 2014-09-21 LAB — VITAMIN B12: VITAMIN B 12: 156 pg/mL — AB (ref 180–914)

## 2014-09-21 MED ORDER — DIPHENOXYLATE-ATROPINE 2.5-0.025 MG PO TABS: 2.0000 | ORAL_TABLET | Freq: Three times a day (TID) | ORAL | Status: DC

## 2014-09-21 MED ORDER — CYANOCOBALAMIN 1000 MCG/ML IJ SOLN
1000.0000 ug | Freq: Once | INTRAMUSCULAR | Status: AC
Start: 2014-09-21 — End: 2014-09-21
  Administered 2014-09-21: 1000 ug via INTRAMUSCULAR
  Filled 2014-09-21: qty 1

## 2014-09-21 NOTE — Patient Instructions (Addendum)
[facility] at [facility] Discharge Instructions  RECOMMENDATIONS MADE BY THE CONSULTANT AND ANY TEST RESULTS WILL BE SENT TO YOUR REFERRING PHYSICIAN.   Exam and discussion with Kirby Crigler, PA. B12 injection today and monthly. CT abdomen/pelvis in January 2017. Return in 6 months for office visit.  Thank you for choosing [place] at [facility] to provide your oncology and hematology care.  To afford each patient quality time with our provider, please arrive at least 15 minutes before your scheduled appointment time.    You need to re-schedule your appointment should you arrive 10 or more minutes late.  We strive to give you quality time with our providers, and arriving late affects you and other patients whose appointments are after yours.  Also, if you no show three or more times for appointments you may be dismissed from the clinic at the providers discretion.     Again, thank you for choosing [facility].  Our hope is that these requests will decrease the amount of time that you wait before being seen by our physicians.       _____________________________________________________________  Should you have questions after your visit to [facility], please contact our office at [phone] between the hours of 8:30 a.m. and 4:30 p.m.  Voicemails left after 4:30 p.m. will not be returned until the following business day.  For prescription refill requests, have your pharmacy contact our office.

## 2014-09-21 NOTE — Progress Notes (Signed)
Nicole [name] presents today for injection per the provider's orders.  B12 administration without incident; see MAR for injection details.  Patient tolerated procedure well and without incident.  No questions or complaints noted at this time.

## 2014-09-21 NOTE — Assessment & Plan Note (Addendum)
Colon cancer x 3.  S/P subtotal colectomy by Dr. Morton Stall at [facility] on 12/11/2005.  She will undergo flexible sigmoidoscopy annually by Dr. Laural Golden, last performed in April 2016.    Labs today: CEA.    Rx refill for Lomotil printed for the patient to submit to her mail pharmacy.

## 2014-09-21 NOTE — Progress Notes (Signed)
[name], MD [place] [address]  Lynch syndrome - Plan: Comprehensive metabolic panel, diphenoxylate-atropine (LOMOTIL) 2.5-0.025 MG per tablet  NHL (non-Hodgkin's lymphoma) - Plan: CBC with Differential, Comprehensive metabolic panel, Lactate dehydrogenase, Sedimentation rate, C-reactive protein, Beta 2 microglobuline, serum  Renal cell carcinoma, left - Plan: Comprehensive metabolic panel  History of colon cancer - Plan: Comprehensive metabolic panel, CEA, diphenoxylate-atropine (LOMOTIL) 2.5-0.025 MG per tablet  B12 deficiency - Plan: CBC with Differential  Diarrhea - Plan: diphenoxylate-atropine (LOMOTIL) 2.5-0.025 MG per tablet  CURRENT THERAPY: Surveillance  INTERVAL HISTORY: Nicole [name] 64 y.o. 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 S/P hysterectomy with bilateral salpingo-oophorectomy in 1993 by Dr. Heide Spark. AND H/O Follicular-type NHL, well-differentiated with CD20 positivity, grade 1 found at the time of sigmoid colon resection in 2007 with 2 involved lymph nodes. AND Colon cancer x 3. S/P subtotal colectomy with ileorectal anastomosis by Dr. Lenise Arena at [facility] on 12/11/2005. She will undergo flexible sigmoidoscopy annually by Dr. Laural Golden and we will follow CEAs AND Renal cancer, S/P left partial nephrectomy by Dr. Beatrix Fetters. AND Decreased son at young age from pancreatic cancer. (?Lynch Syndrome)  Chart reviewed.   I personally reviewed and went over laboratory results with the patient.  The results are noted within this dictation.  She underwent a sigmoidoscopy by Dr. Laural Golden on 07/08/2014 and this was  normal. This should be performed annually.  On 07/14/2014, she underwent cystoscopy, left ureteroscopy and stone removal, ureteroscopic laser lithotripsy, left ureteral stent placement (24 cm x 6 French contour with string), and left retrograde pyelography with interpretation by Dr. Beatrix Fetters.  She retired in May 2015 and she notes that she is doing well since retiring.    Oncologically and hematologically, she denies any complaints including B symptoms.  ROS questioning is negative.  Past Medical History  Diagnosis Date  . B12 deficiency   . Right shoulder pain S/P ROTATOR CUFF REPAIR  . Chronic diarrhea SECONDARY HX COLON CANCER  . Personal history of renal cancer S/P LEFT PARTIAL NEPHRECTOMY 2007--  NO RECURRENCE    DUE TO KIDNEY CANCER  . History of colon cancer [facility] SYNDROME ---  COLORECTAL CANCER S/P COLECTOMY X3  LAST ONE 2007    NO RECURRENCE  . Arthritis     shorulder  . History of kidney stones   . History of colon cancer 05/04/2013  . History of MRSA infection 08/2013    left leg  . History of kidney infection 08/2013  . Hx of osteopenia   . Diarrhea   . Kidney stone   . Lynch syndrome 10/17/2010    Hereditary colon cancer  . NHL (non-Hodgkin's lymphoma) 10/17/2010    MONITORED BY DR Tressie Stalker    has Lynch syndrome; Renal cell carcinoma; NHL (non-Hodgkin's lymphoma); Obesity; B12 deficiency; Rotator cuff tear, right; Pain in joint, shoulder region; Muscle weakness (generalized); History of colon cancer; Renal calculi; and Renal stones on her problem list.     is allergic to latex; morphine and related; and tape.  Ms. Cardin had no medications administered during this visit.  Past Surgical History  Procedure Laterality Date  .  Hemicolectomy  2001    right  . Hemicolectomy  2002    left  . Kidney resection  2007    left partial  . Subtotal colectomy  2007  . Flexible sigmoidoscopy  08/29/2011    Procedure: FLEXIBLE SIGMOIDOSCOPY;  Surgeon: Rogene Houston, MD;   [facility];  Service: Endoscopy;  Laterality: N/A;  930  . Colonoscopy w/ polypectomy    . Left flank exploration w/  partial left nephrectomy/ left hilar lymph node bx  04-17-2005  DR DAHLSTEDT    RENAL CELL CARCINOMA  . Laparoscopic assisted vaginal hysterectomy  1993    W/ BILATERAL SALPINGO-OOPHORECTOMY  . Right rotator  cuff repair/   bicep repair  10-17-2011  DR SUPPLE  . Appendectomy  1993    W/ LAVH  . Cystoscopy with stent placement  02/07/2012    Procedure: CYSTOSCOPY WITH STENT PLACEMENT;  Surgeon: Franchot Gallo, MD;  Location: [facility];  Service: Urology;  Laterality: Left;  . Lithotripsy  11-22013    x2  . Flexible sigmoidoscopy N/A 09/28/2012    Procedure: FLEXIBLE SIGMOIDOSCOPY;  Surgeon: Rogene Houston, MD;  [facility];  Service: Endoscopy;  Laterality: N/A;  730  . Nephrolithotomy Left 06/09/2014    Procedure: NEPHROLITHOTOMY PERCUTANEOUS;  Surgeon: Franchot Gallo, MD;  Location: [facility];  Service: Urology;  Laterality: Left;  with STENT  . Flexible sigmoidoscopy N/A 07/08/2014    Procedure: FLEXIBLE SIGMOIDOSCOPY;  Surgeon: Rogene Houston, MD;  [facility];  Service: Endoscopy;  Laterality: N/A;  830 - moved to 10:20 - Ann to notify pt  . Ureteroscopy with holmium laser lithotripsy Left 07/14/2014    Procedure: URETEROSCOPY  EXTRACTION OF STONES;  Surgeon: Franchot Gallo, MD;  Location: [facility];  Service: Urology;  Laterality: Left;  . Holmium laser application Left 2/58/5277    Procedure: HOLMIUM LASER LITHOTRIPSY,;  Surgeon: Franchot Gallo, MD;  Location: [facility];  Service: Urology;  Laterality: Left;  . Cystoscopy w/ ureteral stent removal Left 07/14/2014    Procedure: CYSTOSCOPY WITH STENT REMOVAL;  Surgeon: Franchot Gallo, MD;  Location: [facility];  Service: Urology;  Laterality: Left;  . Cystoscopy with stent placement Left 07/14/2014     Procedure: CYSTOSCOPY WITH STENT PLACEMENT;  Surgeon: Franchot Gallo, MD;  Location: [facility];  Service: Urology;  Laterality: Left;  . Cystoscopy w/ retrogrades  07/14/2014    Procedure: CYSTOSCOPY WITH RETROGRADE PYELOGRAM;  Surgeon: Franchot Gallo, MD;  Location: [facility];  Service: Urology;;    Denies any headaches, dizziness, double vision, fevers, chills, night sweats, nausea, vomiting, diarrhea, constipation, chest pain, heart palpitations, shortness of breath, blood in stool, black tarry stool, urinary pain, urinary burning, urinary frequency, hematuria.   PHYSICAL EXAMINATION  ECOG PERFORMANCE STATUS: 0 - Asymptomatic  Filed Vitals:   09/21/14 1010  BP: 165/89  Pulse: 82  Temp: 98.5 F (36.9 C)  Resp: 18    GENERAL:alert, no distress, well nourished, well developed, comfortable, cooperative, obese and smiling SKIN: skin color, texture, turgor are normal, no rashes or significant lesions HEAD: Normocephalic, No masses, lesions, tenderness or abnormalities EYES: normal, PERRLA, EOMI, Conjunctiva are pink and non-injected EARS: External ears normal OROPHARYNX:lips, buccal mucosa, and tongue normal and mucous membranes are moist  NECK: supple, no adenopathy, thyroid normal size, non-tender, without nodularity, no stridor, non-tender, trachea midline LYMPH:  no palpable lymphadenopathy, no hepatosplenomegaly BREAST:not examined LUNGS: clear to  auscultation and percussion HEART: regular rate & rhythm, no murmurs, no gallops, S1 normal and S2 normal ABDOMEN:abdomen soft, non-tender, obese, normal bowel sounds, no masses or organomegaly and no hepatosplenomegaly BACK: Back symmetric, no curvature., No CVA tenderness EXTREMITIES:less then 2 second capillary refill, no joint deformities, effusion, or inflammation, no edema, no skin discoloration, no clubbing, no cyanosis  NEURO: alert & oriented x 3 with fluent speech, no focal motor/sensory  deficits, gait normal   LABORATORY DATA: CBC    Component Value Date/Time   WBC 8.5 09/21/2014 1028   RBC 4.51 09/21/2014 1028   HGB 13.2 09/21/2014 1028   HCT 40.4 09/21/2014 1028   PLT 176 09/21/2014 1028   MCV 89.6 09/21/2014 1028   MCH 29.3 09/21/2014 1028   MCHC 32.7 09/21/2014 1028   RDW 13.0 09/21/2014 1028   LYMPHSABS 1.8 09/21/2014 1028   MONOABS 0.6 09/21/2014 1028   EOSABS 0.2 09/21/2014 1028   BASOSABS 0.1 09/21/2014 1028      Chemistry      Component Value Date/Time   NA 140 09/21/2014 1028   K 4.0 09/21/2014 1028   CL 106 09/21/2014 1028   CO2 26 09/21/2014 1028   BUN 21* 09/21/2014 1028   CREATININE 0.78 09/21/2014 1028      Component Value Date/Time   CALCIUM 8.8* 09/21/2014 1028   ALKPHOS 82 09/21/2014 1028   AST 17 09/21/2014 1028   ALT 17 09/21/2014 1028   BILITOT 0.6 09/21/2014 1028       RADIOGRAPHIC STUDIES:  No results found.    ASSESSMENT AND PLAN:  Lynch syndrome Genetic testing at [facility] on November 21, 2005 demonstrating MLH1 mutation [phone] conferring an 82% risk of colorectal cancer, 60% risk of endometrial cancer (by age 7), 13% risk of gastric cancer, 12 % risk of ovarian cancer (by age 89), and first degree relatives have a 1 in 2 chance of having this mutation. S/P hysterectomy with bilateral salpingo-oophorectomy in 1993 by Dr. Heide Spark.  She is due for CT abd/pelvis in Jan 2017 for annual surveillance.    Return in 6 months for follow-up.  NHL (non-Hodgkin's lymphoma) Well-differentiated with CD20 positivity, grade 1 found at the time of sigmoid colon resection in 2007 with 2 involved lymph nodes.  NED.   Labs today and in 6 months: CBC diff, CMET, LDH, B2M, ESR.   CT abd/pelvis in near future for annual surveillance.  Renal cell carcinoma S/P left partial nephrectomy by Dr. Diona Fanti.  NED  CT abd/pelvis in Jan 2017 for annual surveillance.   On 07/14/2014, she underwent cystoscopy, left ureteroscopy and stone  removal, ureteroscopic laser lithotripsy, left ureteral stent placement (24 cm x 6 French contour with string), and left retrograde pyelography with interpretation by Dr. Beatrix Fetters.  History of colon cancer Colon cancer x 3.  S/P subtotal colectomy by Dr. Morton Stall at [facility] on 12/11/2005.  She will undergo flexible sigmoidoscopy annually by Dr. Laural Golden, last performed in April 2016.    Labs today: CEA.    Rx refill for Lomotil printed for the patient to submit to her mail pharmacy.  B12 deficiency Negative anti-parietal cell or intrinsic factor antibody testing.  B12 injections every 4 weeks, taking at home, but now she wants [place] to administer.  Supportive therapy plan built and we will start today, 09/21/2014.  Labs today: Vit B12.     THERAPY PLAN:  Will continue surveillance given her Lynch Syndrome with labs every 6 months and annual CT abd/pelvis imaging.  All questions  were answered. The patient knows to call the clinic with any problems, questions or concerns. We can certainly see the patient much sooner if necessary.  Patient and plan discussed with Dr. Ancil Linsey and she is in agreement with the aforementioned.   KEFALAS,THOMAS 09/21/2014

## 2014-09-21 NOTE — Progress Notes (Signed)
Labs drawn

## 2014-09-21 NOTE — Assessment & Plan Note (Addendum)
Negative anti-parietal cell or intrinsic factor antibody testing.  B12 injections every 4 weeks, taking at home, but now she wants [place] to administer.  Supportive therapy plan built and we will start today, 09/21/2014.  Labs today: Vit B12.

## 2014-09-21 NOTE — Assessment & Plan Note (Addendum)
Genetic testing at [facility] on November 21, 2005 demonstrating MLH1 mutation [id number] conferring an 82% risk of colorectal cancer, 60% risk of endometrial cancer (by age 65), 13% risk of gastric cancer, 12 % risk of ovarian cancer (by age 55), and first degree relatives have a 1 in 2 chance of having this mutation. S/P hysterectomy with bilateral salpingo-oophorectomy in 1993 by Dr. Heide Spark.  She is due for CT abd/pelvis in Jan 2017 for annual surveillance.    Return in 6 months for follow-up.

## 2014-09-21 NOTE — Assessment & Plan Note (Signed)
Well-differentiated with CD20 positivity, grade 1 found at the time of sigmoid colon resection in 2007 with 2 involved lymph nodes.  NED.   Labs today and in 6 months: CBC diff, CMET, LDH, B2M, ESR.   CT abd/pelvis in near future for annual surveillance.

## 2014-09-21 NOTE — Addendum Note (Signed)
Addended by: Joie Bimler on: 09/21/2014 11:24 AM   Modules accepted: Orders

## 2014-09-21 NOTE — Assessment & Plan Note (Addendum)
S/P left partial nephrectomy by Dr. Diona Fanti.  NED  CT abd/pelvis in Jan 2017 for annual surveillance.   On 07/14/2014, she underwent cystoscopy, left ureteroscopy and stone removal, ureteroscopic laser lithotripsy, left ureteral stent placement (24 cm x 6 French contour with string), and left retrograde pyelography with interpretation by Dr. Beatrix Fetters.

## 2014-09-22 LAB — BETA 2 MICROGLOBULIN, SERUM: Beta-2 Microglobulin: 1.8 mg/L (ref 0.6–2.4)

## 2014-09-22 LAB — CEA: CEA: 2.8 ng/mL (ref 0.0–4.7)

## 2014-10-05 ENCOUNTER — Encounter (HOSPITAL_COMMUNITY): Payer: Medicare Other

## 2014-10-05 DIAGNOSIS — C8590 Non-Hodgkin lymphoma, unspecified, unspecified site: Secondary | ICD-10-CM | POA: Diagnosis not present

## 2014-10-05 DIAGNOSIS — E538 Deficiency of other specified B group vitamins: Secondary | ICD-10-CM

## 2014-10-05 LAB — VITAMIN B12: VITAMIN B 12: 226 pg/mL (ref 180–914)

## 2014-10-05 NOTE — Progress Notes (Unsigned)
Labs drawn

## 2014-10-21 ENCOUNTER — Encounter (HOSPITAL_COMMUNITY): Payer: Medicare Other | Attending: Hematology & Oncology

## 2014-10-21 ENCOUNTER — Encounter (HOSPITAL_COMMUNITY): Payer: Self-pay

## 2014-10-21 VITALS — BP 146/92 | HR 86 | Temp 98.4°F | Resp 18

## 2014-10-21 DIAGNOSIS — Z85038 Personal history of other malignant neoplasm of large intestine: Secondary | ICD-10-CM | POA: Insufficient documentation

## 2014-10-21 DIAGNOSIS — R197 Diarrhea, unspecified: Secondary | ICD-10-CM | POA: Insufficient documentation

## 2014-10-21 DIAGNOSIS — E538 Deficiency of other specified B group vitamins: Secondary | ICD-10-CM | POA: Insufficient documentation

## 2014-10-21 DIAGNOSIS — C8590 Non-Hodgkin lymphoma, unspecified, unspecified site: Secondary | ICD-10-CM | POA: Insufficient documentation

## 2014-10-21 DIAGNOSIS — C649 Malignant neoplasm of unspecified kidney, except renal pelvis: Secondary | ICD-10-CM | POA: Insufficient documentation

## 2014-10-21 MED ORDER — CYANOCOBALAMIN 1000 MCG/ML IJ SOLN
1000.0000 ug | Freq: Once | INTRAMUSCULAR | Status: AC
  Administered 2014-10-21: 1000 ug via INTRAMUSCULAR

## 2014-10-21 MED ORDER — CYANOCOBALAMIN 1000 MCG/ML IJ SOLN
INTRAMUSCULAR | Status: AC
  Filled 2014-10-21: qty 1

## 2014-10-21 NOTE — Progress Notes (Signed)
Nicole [name]'s reason for visit today is for an injection and labs as scheduled per MD orders.  Labs were drawn prior to administration of ordered medication.   Nicole [name] also received b12 injection per MD orders; see [facility] for administration details.  Nicole [name] tolerated all procedures well and without incident; questions were answered and patient was discharged.

## 2014-10-21 NOTE — Patient Instructions (Signed)
[facility] at [facility] Discharge Instructions  RECOMMENDATIONS MADE BY THE CONSULTANT AND ANY TEST RESULTS WILL BE SENT TO YOUR REFERRING PHYSICIAN.  b12 injection today Follow up as scheduled Please call the clinic if you have any questions or concerns  Thank you for choosing [facility] at [facility] to provide your oncology and hematology care.  To afford each patient quality time with our provider, please arrive at least 15 minutes before your scheduled appointment time.    You need to re-schedule your appointment should you arrive 10 or more minutes late.  We strive to give you quality time with our providers, and arriving late affects you and other patients whose appointments are after yours.  Also, if you no show three or more times for appointments you may be dismissed from the clinic at the providers discretion.     Again, thank you for choosing [facility].  Our hope is that these requests will decrease the amount of time that you wait before being seen by our physicians.       _____________________________________________________________  Should you have questions after your visit to [facility], please contact our office at [phone] between the hours of 8:30 a.m. and 4:30 p.m.  Voicemails left after 4:30 p.m. will not be returned until the following business day.  For prescription refill requests, have your pharmacy contact our office.     

## 2014-11-18 ENCOUNTER — Encounter (HOSPITAL_COMMUNITY): Payer: Medicare Other | Attending: Hematology & Oncology

## 2014-11-18 ENCOUNTER — Encounter (HOSPITAL_COMMUNITY): Payer: Self-pay

## 2014-11-18 VITALS — BP 164/81 | HR 70 | Temp 98.1°F | Resp 18

## 2014-11-18 DIAGNOSIS — Z85038 Personal history of other malignant neoplasm of large intestine: Secondary | ICD-10-CM | POA: Insufficient documentation

## 2014-11-18 DIAGNOSIS — E538 Deficiency of other specified B group vitamins: Secondary | ICD-10-CM | POA: Diagnosis not present

## 2014-11-18 DIAGNOSIS — C8590 Non-Hodgkin lymphoma, unspecified, unspecified site: Secondary | ICD-10-CM | POA: Insufficient documentation

## 2014-11-18 DIAGNOSIS — R197 Diarrhea, unspecified: Secondary | ICD-10-CM | POA: Insufficient documentation

## 2014-11-18 DIAGNOSIS — C649 Malignant neoplasm of unspecified kidney, except renal pelvis: Secondary | ICD-10-CM | POA: Insufficient documentation

## 2014-11-18 MED ORDER — CYANOCOBALAMIN 1000 MCG/ML IJ SOLN
1000.0000 ug | Freq: Once | INTRAMUSCULAR | Status: AC
Start: 2014-11-18 — End: 2014-11-18
  Administered 2014-11-18: 1000 ug via INTRAMUSCULAR

## 2014-11-18 NOTE — Progress Notes (Signed)
1330:  Nicole [name] presents today for injection per the provider's orders.  B12 administration without incident; see MAR for injection details.  Patient tolerated procedure well and without incident.  No questions or complaints noted at this time.

## 2014-11-18 NOTE — Patient Instructions (Signed)
[place] at [facility] Discharge Instructions  RECOMMENDATIONS MADE BY THE CONSULTANT AND ANY TEST RESULTS WILL BE SENT TO YOUR REFERRING PHYSICIAN.  B12 injection today. Return as scheduled for injections. Return as scheduled for lab work and office visit. Call the clinic with any questions or concerns.  Thank you for choosing [place] at [facility] to provide your oncology and hematology care.  To afford each patient quality time with our provider, please arrive at least 15 minutes before your scheduled appointment time.    You need to re-schedule your appointment should you arrive 10 or more minutes late.  We strive to give you quality time with our providers, and arriving late affects you and other patients whose appointments are after yours.  Also, if you no show three or more times for appointments you may be dismissed from the clinic at the providers discretion.     Again, thank you for choosing [facility]  Our hope is that these requests will decrease the amount of time that you wait before being seen by our physicians.       _____________________________________________________________  Should you have questions after your visit to [facility], please contact our office at [phone] between the hours of 8:30 a.m. and 4:30 p.m.  Voicemails left after 4:30 p.m. will not be returned until the following business day.  For prescription refill requests, have your pharmacy contact our office.

## 2014-12-16 ENCOUNTER — Encounter (HOSPITAL_COMMUNITY): Payer: Self-pay

## 2014-12-16 ENCOUNTER — Encounter (HOSPITAL_COMMUNITY): Payer: Medicare Other | Attending: Hematology & Oncology

## 2014-12-16 VITALS — BP 139/76 | HR 85 | Temp 97.2°F | Resp 16

## 2014-12-16 DIAGNOSIS — E538 Deficiency of other specified B group vitamins: Secondary | ICD-10-CM

## 2014-12-16 MED ORDER — CYANOCOBALAMIN 1000 MCG/ML IJ SOLN
1000.0000 ug | Freq: Once | INTRAMUSCULAR | Status: AC
  Administered 2014-12-16: 1000 ug via INTRAMUSCULAR

## 2014-12-16 MED ORDER — CYANOCOBALAMIN 1000 MCG/ML IJ SOLN
INTRAMUSCULAR | Status: AC
  Filled 2014-12-16: qty 1

## 2014-12-16 NOTE — Progress Notes (Signed)
1400:  Nicole [name] presents today for injection per the provider's orders.  B12 administration without incident; see MAR for injection details.  Patient tolerated procedure well and without incident.  No questions or complaints noted at this time.

## 2014-12-16 NOTE — Patient Instructions (Signed)
[place] at [facility] Discharge Instructions  RECOMMENDATIONS MADE BY THE CONSULTANT AND ANY TEST RESULTS WILL BE SENT TO YOUR REFERRING PHYSICIAN.  B12 injection today. Return as scheduled for injections. Return as scheduled for labs and office visit.   Thank you for choosing [place] at [facility] to provide your oncology and hematology care.  To afford each patient quality time with our Deni Berti, please arrive at least 15 minutes before your scheduled appointment time.    You need to re-schedule your appointment should you arrive 10 or more minutes late.  We strive to give you quality time with our providers, and arriving late affects you and other patients whose appointments are after yours.  Also, if you no show three or more times for appointments you may be dismissed from the clinic at the providers discretion.     Again, thank you for choosing [facility]  Our hope is that these requests will decrease the amount of time that you wait before being seen by our physicians.       _____________________________________________________________  Should you have questions after your visit to [facility], please contact our office at [phone] between the hours of 8:30 a.m. and 4:30 p.m.  Voicemails left after 4:30 p.m. will not be returned until the following business day.  For prescription refill requests, have your pharmacy contact our office.

## 2015-01-13 ENCOUNTER — Encounter (HOSPITAL_COMMUNITY): Payer: Medicare Other | Attending: Oncology

## 2015-01-13 ENCOUNTER — Encounter (HOSPITAL_COMMUNITY): Payer: Self-pay

## 2015-01-13 VITALS — BP 140/80 | HR 80 | Temp 98.0°F | Resp 20

## 2015-01-13 DIAGNOSIS — E538 Deficiency of other specified B group vitamins: Secondary | ICD-10-CM

## 2015-01-13 MED ORDER — CYANOCOBALAMIN 1000 MCG/ML IJ SOLN
1000.0000 ug | Freq: Once | INTRAMUSCULAR | Status: AC
  Administered 2015-01-13: 1000 ug via INTRAMUSCULAR

## 2015-01-13 NOTE — Patient Instructions (Signed)
[place] at [facility] Discharge Instructions  RECOMMENDATIONS MADE BY THE CONSULTANT AND ANY TEST RESULTS WILL BE SENT TO YOUR REFERRING PHYSICIAN.  B12 injection given today per order We will see you at your next visit.  Thank you for choosing [place] at [facility] to provide your oncology and hematology care.  To afford each patient quality time with our provider, please arrive at least 15 minutes before your scheduled appointment time.    You need to re-schedule your appointment should you arrive 10 or more minutes late.  We strive to give you quality time with our providers, and arriving late affects you and other patients whose appointments are after yours.  Also, if you no show three or more times for appointments you may be dismissed from the clinic at the providers discretion.     Again, thank you for choosing [facility].  Our hope is that these requests will decrease the amount of time that you wait before being seen by our physicians.       _____________________________________________________________  Should you have questions after your visit to [facility], please contact our office at [phone] between the hours of 8:30 a.m. and 4:30 p.m.  Voicemails left after 4:30 p.m. will not be returned until the following business day.  For prescription refill requests, have your pharmacy contact our office.

## 2015-01-13 NOTE — Progress Notes (Signed)
Nicole [name] presents today for injection per MD orders. B12 1075mcg administered SQ in right Upper Arm. Administration without incident. Patient tolerated well.

## 2015-02-13 ENCOUNTER — Encounter (HOSPITAL_COMMUNITY): Payer: PPO

## 2015-02-14 ENCOUNTER — Ambulatory Visit (INDEPENDENT_AMBULATORY_CARE_PROVIDER_SITE_OTHER): Payer: Medicare Other | Admitting: Urology

## 2015-02-14 ENCOUNTER — Ambulatory Visit (HOSPITAL_COMMUNITY)
Admission: RE | Admit: 2015-02-14 | Discharge: 2015-02-14 | Disposition: A | Discharge status: Home / Self Care | Payer: Medicare Other | Source: Ambulatory Visit | Attending: Urology | Admitting: Urology

## 2015-02-14 ENCOUNTER — Other Ambulatory Visit: Payer: Self-pay | Admitting: Urology

## 2015-02-14 DIAGNOSIS — N200 Calculus of kidney: Secondary | ICD-10-CM | POA: Diagnosis present

## 2015-02-14 DIAGNOSIS — N201 Calculus of ureter: Secondary | ICD-10-CM

## 2015-02-15 ENCOUNTER — Encounter (HOSPITAL_COMMUNITY): Payer: PPO | Attending: Hematology & Oncology

## 2015-02-15 VITALS — BP 174/85 | HR 82 | Temp 98.7°F | Resp 20

## 2015-02-15 DIAGNOSIS — C649 Malignant neoplasm of unspecified kidney, except renal pelvis: Secondary | ICD-10-CM | POA: Insufficient documentation

## 2015-02-15 DIAGNOSIS — Z85038 Personal history of other malignant neoplasm of large intestine: Secondary | ICD-10-CM | POA: Insufficient documentation

## 2015-02-15 DIAGNOSIS — E538 Deficiency of other specified B group vitamins: Secondary | ICD-10-CM

## 2015-02-15 DIAGNOSIS — C8590 Non-Hodgkin lymphoma, unspecified, unspecified site: Secondary | ICD-10-CM | POA: Insufficient documentation

## 2015-02-15 DIAGNOSIS — R197 Diarrhea, unspecified: Secondary | ICD-10-CM | POA: Insufficient documentation

## 2015-02-15 MED ORDER — CYANOCOBALAMIN 1000 MCG/ML IJ SOLN
INTRAMUSCULAR | Status: AC
  Filled 2015-02-15: qty 1

## 2015-02-15 MED ORDER — CYANOCOBALAMIN 1000 MCG/ML IJ SOLN
1000.0000 ug | Freq: Once | INTRAMUSCULAR | Status: AC
Start: 2015-02-15 — End: 2015-02-15
  Administered 2015-02-15: 1000 ug via INTRAMUSCULAR

## 2015-02-15 NOTE — Patient Instructions (Signed)
[facility] at [facility] Discharge Instructions  RECOMMENDATIONS MADE BY THE CONSULTANT AND ANY TEST RESULTS WILL BE SENT TO YOUR REFERRING PHYSICIAN.  Vitamin B12 1000 mcg injection given today as ordered. Return as scheduled.  Thank you for choosing [facility] at [facility] to provide your oncology and hematology care.  To afford each patient quality time with our provider, please arrive at least 15 minutes before your scheduled appointment time.    You need to re-schedule your appointment should you arrive 10 or more minutes late.  We strive to give you quality time with our providers, and arriving late affects you and other patients whose appointments are after yours.  Also, if you no show three or more times for appointments you may be dismissed from the clinic at the providers discretion.     Again, thank you for choosing [facility].  Our hope is that these requests will decrease the amount of time that you wait before being seen by our physicians.       _____________________________________________________________  Should you have questions after your visit to [facility], please contact our office at [phone] between the hours of 8:30 a.m. and 4:30 p.m.  Voicemails left after 4:30 p.m. will not be returned until the following business day.  For prescription refill requests, have your pharmacy contact our office.    

## 2015-02-15 NOTE — Progress Notes (Signed)
Azka B Dominik presents today for injection per MD orders. B12 1000mcg administered IM in left Upper Arm. Administration without incident. Patient tolerated well.  

## 2015-03-15 ENCOUNTER — Encounter (HOSPITAL_COMMUNITY): Payer: Self-pay

## 2015-03-15 ENCOUNTER — Encounter (HOSPITAL_COMMUNITY): Payer: PPO | Attending: Hematology & Oncology

## 2015-03-15 VITALS — BP 142/70 | HR 85 | Temp 98.0°F | Resp 16

## 2015-03-15 DIAGNOSIS — E538 Deficiency of other specified B group vitamins: Secondary | ICD-10-CM | POA: Insufficient documentation

## 2015-03-15 DIAGNOSIS — C8590 Non-Hodgkin lymphoma, unspecified, unspecified site: Secondary | ICD-10-CM | POA: Insufficient documentation

## 2015-03-15 DIAGNOSIS — R197 Diarrhea, unspecified: Secondary | ICD-10-CM | POA: Insufficient documentation

## 2015-03-15 DIAGNOSIS — C649 Malignant neoplasm of unspecified kidney, except renal pelvis: Secondary | ICD-10-CM | POA: Insufficient documentation

## 2015-03-15 DIAGNOSIS — Z85038 Personal history of other malignant neoplasm of large intestine: Secondary | ICD-10-CM | POA: Insufficient documentation

## 2015-03-15 MED ORDER — CYANOCOBALAMIN 1000 MCG/ML IJ SOLN
1000.0000 ug | Freq: Once | INTRAMUSCULAR | Status: AC
  Administered 2015-03-15: 1000 ug via INTRAMUSCULAR
  Filled 2015-03-15: qty 1

## 2015-03-15 NOTE — Patient Instructions (Signed)
[place] at [facility] Discharge Instructions  RECOMMENDATIONS MADE BY THE CONSULTANT AND ANY TEST RESULTS WILL BE SENT TO YOUR REFERRING PHYSICIAN.  B12 injection today. CT scan as scheduled. Office visit as scheduled.  Thank you for choosing [place] at [facility] to provide your oncology and hematology care.  To afford each patient quality time with our provider, please arrive at least 15 minutes before your scheduled appointment time.    You need to re-schedule your appointment should you arrive 10 or more minutes late.  We strive to give you quality time with our providers, and arriving late affects you and other patients whose appointments are after yours.  Also, if you no show three or more times for appointments you may be dismissed from the clinic at the providers discretion.     Again, thank you for choosing [facility].  Our hope is that these requests will decrease the amount of time that you wait before being seen by our physicians.       _____________________________________________________________  Should you have questions after your visit to [facility], please contact our office at [phone] between the hours of 8:30 a.m. and 4:30 p.m.  Voicemails left after 4:30 p.m. will not be returned until the following business day.  For prescription refill requests, have your pharmacy contact our office.

## 2015-03-15 NOTE — Progress Notes (Signed)
Nicole [name] presents today for injection per the provider's orders.  B12 administration without incident; see MAR for injection details.  Patient tolerated procedure well and without incident.  No questions or complaints noted at this time. 

## 2015-03-22 ENCOUNTER — Ambulatory Visit (HOSPITAL_COMMUNITY)
Admission: RE | Admit: 2015-03-22 | Discharge: 2015-03-22 | Disposition: A | Discharge status: Home / Self Care | Payer: PPO | Source: Ambulatory Visit | Attending: Oncology | Admitting: Oncology

## 2015-03-22 ENCOUNTER — Telehealth (HOSPITAL_COMMUNITY): Payer: Self-pay | Admitting: Emergency Medicine

## 2015-03-22 DIAGNOSIS — R599 Enlarged lymph nodes, unspecified: Secondary | ICD-10-CM | POA: Insufficient documentation

## 2015-03-22 DIAGNOSIS — Z9889 Other specified postprocedural states: Secondary | ICD-10-CM | POA: Diagnosis not present

## 2015-03-22 DIAGNOSIS — N200 Calculus of kidney: Secondary | ICD-10-CM | POA: Insufficient documentation

## 2015-03-22 DIAGNOSIS — Z1507 Genetic susceptibility to malignant neoplasm of urinary tract: Secondary | ICD-10-CM

## 2015-03-22 DIAGNOSIS — Z905 Acquired absence of kidney: Secondary | ICD-10-CM | POA: Diagnosis not present

## 2015-03-22 DIAGNOSIS — Z9049 Acquired absence of other specified parts of digestive tract: Secondary | ICD-10-CM | POA: Diagnosis not present

## 2015-03-22 DIAGNOSIS — C189 Malignant neoplasm of colon, unspecified: Secondary | ICD-10-CM | POA: Diagnosis not present

## 2015-03-22 DIAGNOSIS — Z85038 Personal history of other malignant neoplasm of large intestine: Secondary | ICD-10-CM

## 2015-03-22 DIAGNOSIS — K8020 Calculus of gallbladder without cholecystitis without obstruction: Secondary | ICD-10-CM | POA: Diagnosis not present

## 2015-03-22 DIAGNOSIS — C649 Malignant neoplasm of unspecified kidney, except renal pelvis: Secondary | ICD-10-CM

## 2015-03-22 DIAGNOSIS — C8590 Non-Hodgkin lymphoma, unspecified, unspecified site: Secondary | ICD-10-CM

## 2015-03-22 DIAGNOSIS — Z1509 Genetic susceptibility to other malignant neoplasm: Secondary | ICD-10-CM

## 2015-03-22 LAB — POCT I-STAT CREATININE: CREATININE: 0.8 mg/dL (ref 0.44–1.00)

## 2015-03-22 MED ORDER — IOHEXOL 300 MG/ML  SOLN
100.0000 mL | Freq: Once | INTRAMUSCULAR | Status: AC | PRN
Start: 2015-03-22 — End: 2015-03-22
  Administered 2015-03-22: 100 mL via INTRAVENOUS

## 2015-03-22 NOTE — Telephone Encounter (Signed)
-----   Message from [facility], PA-C sent at 03/22/2015  2:11 PM EST ----- No evidence of recurrence.  Will review in more detail at office visit.

## 2015-03-22 NOTE — Telephone Encounter (Signed)
Pt notified of results

## 2015-03-24 ENCOUNTER — Encounter (HOSPITAL_COMMUNITY): Payer: PPO | Attending: Hematology & Oncology | Admitting: Hematology & Oncology

## 2015-03-24 ENCOUNTER — Encounter (HOSPITAL_COMMUNITY): Payer: PPO

## 2015-03-24 DIAGNOSIS — E538 Deficiency of other specified B group vitamins: Secondary | ICD-10-CM

## 2015-03-24 DIAGNOSIS — C649 Malignant neoplasm of unspecified kidney, except renal pelvis: Secondary | ICD-10-CM | POA: Insufficient documentation

## 2015-03-24 DIAGNOSIS — Z8553 Personal history of malignant neoplasm of renal pelvis: Secondary | ICD-10-CM

## 2015-03-24 DIAGNOSIS — C8590 Non-Hodgkin lymphoma, unspecified, unspecified site: Secondary | ICD-10-CM

## 2015-03-24 DIAGNOSIS — Z8572 Personal history of non-Hodgkin lymphomas: Secondary | ICD-10-CM

## 2015-03-24 DIAGNOSIS — K909 Intestinal malabsorption, unspecified: Secondary | ICD-10-CM

## 2015-03-24 DIAGNOSIS — Z85038 Personal history of other malignant neoplasm of large intestine: Secondary | ICD-10-CM | POA: Insufficient documentation

## 2015-03-24 DIAGNOSIS — Z1509 Genetic susceptibility to other malignant neoplasm: Secondary | ICD-10-CM

## 2015-03-24 DIAGNOSIS — R197 Diarrhea, unspecified: Secondary | ICD-10-CM | POA: Diagnosis not present

## 2015-03-24 DIAGNOSIS — C642 Malignant neoplasm of left kidney, except renal pelvis: Secondary | ICD-10-CM

## 2015-03-24 LAB — COMPREHENSIVE METABOLIC PANEL
ALBUMIN: 3.8 g/dL (ref 3.5–5.0)
ALK PHOS: 73 U/L (ref 38–126)
ALT: 17 U/L (ref 14–54)
AST: 19 U/L (ref 15–41)
Anion gap: 10 (ref 5–15)
BILIRUBIN TOTAL: 0.5 mg/dL (ref 0.3–1.2)
BUN: 23 mg/dL — AB (ref 6–20)
CALCIUM: 9.6 mg/dL (ref 8.9–10.3)
CO2: 27 mmol/L (ref 22–32)
CREATININE: 0.83 mg/dL (ref 0.44–1.00)
Chloride: 104 mmol/L (ref 101–111)
GFR calc Af Amer: >60 mL/min (ref >60)
GFR calc non Af Amer: >60 mL/min (ref >60)
GLUCOSE: 97 mg/dL (ref 65–99)
Potassium: 3.7 mmol/L (ref 3.5–5.1)
Sodium: 141 mmol/L (ref 135–145)
TOTAL PROTEIN: 6.9 g/dL (ref 6.5–8.1)

## 2015-03-24 LAB — CBC WITH DIFFERENTIAL/PLATELET
BASOS ABS: 0.1 10*3/uL (ref 0.0–0.1)
BASOS PCT: 1 %
Eosinophils Absolute: 0.3 10*3/uL (ref 0.0–0.7)
Eosinophils Relative: 4 %
HEMATOCRIT: 40.7 % (ref 36.0–46.0)
HEMOGLOBIN: 13.6 g/dL (ref 12.0–15.0)
Lymphocytes Relative: 21 %
Lymphs Abs: 1.7 10*3/uL (ref 0.7–4.0)
MCH: 30.3 pg (ref 26.0–34.0)
MCHC: 33.4 g/dL (ref 30.0–36.0)
MCV: 90.6 fL (ref 78.0–100.0)
MONOS PCT: 12 %
Monocytes Absolute: 1 10*3/uL (ref 0.1–1.0)
NEUTROS ABS: 4.8 10*3/uL (ref 1.7–7.7)
NEUTROS PCT: 62 %
Platelets: 185 10*3/uL (ref 150–400)
RBC: 4.49 MIL/uL (ref 3.87–5.11)
RDW: 12.7 % (ref 11.5–15.5)
WBC: 7.8 10*3/uL (ref 4.0–10.5)

## 2015-03-24 LAB — SEDIMENTATION RATE: Sed Rate: 18 mm/hr (ref 0–22)

## 2015-03-24 LAB — LACTATE DEHYDROGENASE: LDH: 135 U/L (ref 98–192)

## 2015-03-24 LAB — C-REACTIVE PROTEIN: CRP: 1 mg/dL — ABNORMAL HIGH (ref <1.0)

## 2015-03-24 MED ORDER — DIPHENOXYLATE-ATROPINE 2.5-0.025 MG PO TABS: 2.0000 | ORAL_TABLET | Freq: Three times a day (TID) | ORAL | Status: DC

## 2015-03-24 NOTE — Progress Notes (Signed)
Nicole Bellow, MD [place] [address]  Diarrhea due to malabsorption - Plan: diphenoxylate-atropine (LOMOTIL) 2.5-0.025 MG tablet  Lynch syndrome - Plan: diphenoxylate-atropine (LOMOTIL) 2.5-0.025 MG tablet, CT Abdomen Pelvis W Contrast  History of colon cancer - Plan: diphenoxylate-atropine (LOMOTIL) 2.5-0.025 MG tablet, CT Abdomen Pelvis W Contrast  CURRENT THERAPY: Surveillance  INTERVAL HISTORY: Nicole [name] 65 y.o. female returns for followup of Lynch Syndrome with genetic testing at [facility] on November 21, 2005 demonstrating MLH1 mutation [phone] conferring an 82% risk of colorectal cancer, 60% risk of endometrial cancer (by age 54), 13% risk of gastric cancer, 12 % risk of ovarian cancer (by age 89), and first degree relatives have a 1 in 2 chance of having this mutation. S/P hysterectomy with bilateral salpingo-oophorectomy in 1993 by Dr. Heide Spark. AND H/O Follicular-type NHL, well-differentiated with CD20 positivity, grade 1 found at the time of sigmoid colon resection in 2007 with 2 involved lymph nodes. AND Colon cancer x 3. S/P subtotal colectomy with ileorectal anastomosis by Dr. Lenise Arena at [facility] on 12/11/2005. She will undergo flexible sigmoidoscopy annually by Dr. Laural Golden and we will follow CEAs AND Renal cancer, S/P left partial nephrectomy by Dr. Beatrix Fetters. AND Decreased son at young age from pancreatic cancer. (?Lynch Syndrome)  Chart reviewed.   Nicole [name] returns to the [place] alone today. She reports that her holiday was good.   She does all of her screening, has had a hysterectomy, subtotal colectomy, sees Dr. Laural Golden. Her one living son and her sister have both been tested for Lynch Syndrome, and neither one of them have it. Her son that passed away from pancreatic cancer has two daughters, who will need to be screened for Lynch Syndrome once they are 56.  When asked if her appetite is good, she comments  "unfortunately."  She takes lomotil chronically and states that it does help her, but she tries not to take more than she needs.  She says she doesn't have any problems urinating. She denies any new pain. She has some problems with LE swelling.  She says "I'm sure it's worse in the evening," but none of it is severe or limiting. She confirms that she has joint problems, especially in her knees.  She denies any other major concerns and says she's doing well.  She gets her B12 here.   Past Medical History  Diagnosis Date  . B12 deficiency   . Right shoulder pain S/P ROTATOR CUFF REPAIR  . Chronic diarrhea SECONDARY HX COLON CANCER  . Personal history of renal cancer S/P LEFT PARTIAL NEPHRECTOMY 2007--  NO RECURRENCE    DUE TO KIDNEY CANCER  . History of colon cancer [facility] SYNDROME ---  COLORECTAL CANCER S/P COLECTOMY X3  LAST ONE 2007    NO RECURRENCE  . Arthritis     shorulder  . History of kidney stones   . History of colon cancer 05/04/2013  . History of MRSA infection 08/2013    left leg  . History of kidney infection 08/2013  . Hx of osteopenia   . Diarrhea   . Kidney stone   . Lynch syndrome 10/17/2010    Hereditary colon cancer  . NHL (non-Hodgkin's lymphoma) ([place]) 10/17/2010    MONITORED BY DR Tressie Stalker    has Lynch syndrome; Renal cell carcinoma ([place]); NHL (non-Hodgkin's lymphoma) ([place]); Obesity; B12 deficiency; Rotator cuff tear, right; Pain in joint, shoulder region; Muscle weakness (generalized); History of colon cancer; Renal  calculi; and Renal stones on her problem list.     is allergic to latex; morphine and related; and tape.  Nicole [name] had no medications administered during this visit.  Past Surgical History  Procedure Laterality Date  . Hemicolectomy  2001    right  . Hemicolectomy  2002    left  . Kidney resection  2007    left partial  . Subtotal colectomy  2007  . Flexible sigmoidoscopy  08/29/2011    Procedure: FLEXIBLE SIGMOIDOSCOPY;  Surgeon: Rogene Houston, MD;  [facility];  Service: Endoscopy;  Laterality: N/A;  930  . Colonoscopy w/ polypectomy    . Left flank exploration w/  partial left nephrectomy/ left hilar lymph node bx  04-17-2005  DR DAHLSTEDT    RENAL CELL CARCINOMA  . Laparoscopic assisted vaginal hysterectomy  1993    W/ BILATERAL SALPINGO-OOPHORECTOMY  . Right rotator  cuff repair/   bicep repair  10-17-2011  DR SUPPLE  . Appendectomy  1993    W/ LAVH  . Cystoscopy with stent placement  02/07/2012    Procedure: CYSTOSCOPY WITH STENT PLACEMENT;  Surgeon: Franchot Gallo, MD;  Location: [facility];  Service: Urology;  Laterality: Left;  . Lithotripsy  11-22013    x2  . Flexible sigmoidoscopy N/A 09/28/2012    Procedure: FLEXIBLE SIGMOIDOSCOPY;  Surgeon: Rogene Houston, MD;  [facility];  Service: Endoscopy;  Laterality: N/A;  730  . Nephrolithotomy Left 06/09/2014    Procedure: NEPHROLITHOTOMY PERCUTANEOUS;  Surgeon: Franchot Gallo, MD;  Location: [facility];  Service: Urology;  Laterality: Left;  with STENT  . Flexible sigmoidoscopy N/A 07/08/2014    Procedure: FLEXIBLE SIGMOIDOSCOPY;  Surgeon: Rogene Houston, MD;  [facility];  Service: Endoscopy;  Laterality: N/A;  830 - moved to 10:20 - Ann to notify pt  . Ureteroscopy with holmium laser lithotripsy Left 07/14/2014    Procedure: URETEROSCOPY  EXTRACTION OF STONES;  Surgeon: Franchot Gallo, MD;  Location: [facility];  Service: Urology;  Laterality: Left;  . Holmium laser application Left 0/27/7412    Procedure: HOLMIUM LASER LITHOTRIPSY,;  Surgeon: Franchot Gallo, MD;  Location: [facility];  Service: Urology;  Laterality: Left;  . Cystoscopy w/ ureteral stent removal Left 07/14/2014    Procedure: CYSTOSCOPY WITH STENT REMOVAL;  Surgeon: Franchot Gallo, MD;  Location: [facility];  Service: Urology;  Laterality: Left;  . Cystoscopy with stent placement Left  07/14/2014    Procedure: CYSTOSCOPY WITH STENT PLACEMENT;  Surgeon: Franchot Gallo, MD;  Location: [facility];  Service: Urology;  Laterality: Left;  . Cystoscopy w/ retrogrades  07/14/2014    Procedure: CYSTOSCOPY WITH RETROGRADE PYELOGRAM;  Surgeon: Franchot Gallo, MD;  Location: [facility];  Service: Urology;;    Denies any headaches, dizziness, double vision, fevers, chills, night sweats, nausea, vomiting, diarrhea, constipation, chest pain, heart palpitations, shortness of breath, blood in stool, black tarry stool, urinary pain, urinary burning, urinary frequency, hematuria.  14 point review of systems was performed and is negative except as detailed under history of present illness and above     PHYSICAL EXAMINATION  ECOG PERFORMANCE STATUS: 0 - Asymptomatic  There were no vitals taken for this visit.  GENERAL:alert, no distress, well nourished, well developed, comfortable, cooperative, obese and smiling SKIN: skin color, texture, turgor are normal, no rashes or significant lesions HEAD: Normocephalic, No masses, lesions, tenderness or abnormalities EYES: normal, PERRLA,  EOMI, Conjunctiva are pink and non-injected EARS: External ears normal OROPHARYNX:lips, buccal mucosa, and tongue normal and mucous membranes are moist  NECK: supple, no adenopathy, thyroid normal size, non-tender, without nodularity, no stridor, non-tender, trachea midline LYMPH:  no palpable lymphadenopathy, no hepatosplenomegaly BREAST:not examined LUNGS: clear to auscultation and percussion HEART: regular rate & rhythm, no murmurs, no gallops, S1 normal and S2 normal ABDOMEN:abdomen soft, non-tender, obese, normal bowel sounds, no masses or organomegaly and no hepatosplenomegaly BACK: Back symmetric, no curvature., No CVA tenderness EXTREMITIES:less then 2 second capillary refill, no joint deformities, effusion, or inflammation, no edema, no skin discoloration, no clubbing,  no cyanosis  NEURO: alert & oriented x 3 with fluent speech, no focal motor/sensory deficits, gait normal   LABORATORY DATA: I have reviewed the data as listed  CBC    Component Value Date/Time   WBC 7.8 03/24/2015 1112   RBC 4.49 03/24/2015 1112   HGB 13.6 03/24/2015 1112   HCT 40.7 03/24/2015 1112   PLT 185 03/24/2015 1112   MCV 90.6 03/24/2015 1112   MCH 30.3 03/24/2015 1112   MCHC 33.4 03/24/2015 1112   RDW 12.7 03/24/2015 1112   LYMPHSABS 1.7 03/24/2015 1112   MONOABS 1.0 03/24/2015 1112   EOSABS 0.3 03/24/2015 1112   BASOSABS 0.1 03/24/2015 1112      Chemistry      Component Value Date/Time   NA 141 03/24/2015 1112   K 3.7 03/24/2015 1112   CL 104 03/24/2015 1112   CO2 27 03/24/2015 1112   BUN 23* 03/24/2015 1112   CREATININE 0.83 03/24/2015 1112      Component Value Date/Time   CALCIUM 9.6 03/24/2015 1112   ALKPHOS 73 03/24/2015 1112   AST 19 03/24/2015 1112   ALT 17 03/24/2015 1112   BILITOT 0.5 03/24/2015 1112       RADIOGRAPHIC STUDIES:  03/22/2015 CLINICAL DATA: Colon cancer, left renal cell carcinoma and lymphoma. Partial left nephrectomy and colon resection.  EXAM: CT ABDOMEN AND PELVIS WITH CONTRAST  TECHNIQUE: Multidetector CT imaging of the abdomen and pelvis was performed using the standard protocol following bolus administration of intravenous contrast.  CONTRAST: 129m OMNIPAQUE IOHEXOL 300 MG/ML SOLN  COMPARISON: 04/04/2014.  FINDINGS: Lower chest: Lung bases show no acute findings. Heart size normal. No pericardial or pleural effusion.  Hepatobiliary: Liver appears slightly decreased in attenuation diffusely. Small stones layer in the gallbladder. No biliary ductal dilatation.  Pancreas: Negative.  Spleen: Negative.  Adrenals/Urinary Tract: Adrenal glands and right kidney are unremarkable. Postoperative changes of partial left nephrectomy. Tiny stone in the left kidney. Ureters are decompressed. Bladder  is unremarkable.  Stomach/Bowel: Stomach is unremarkable. Small duodenal diverticulum. Small bowel is otherwise unremarkable. Subtotal colectomy.  Vascular/Lymphatic: Atherosclerotic calcification of the arterial vasculature without abdominal aortic aneurysm. Retroperitoneal lymph nodes measure up to 2.1 cm in short axis posterior the IVC (image 28), stable.  Reproductive: Hysterectomy. Ovaries not well-visualized.  Other: No free fluid. Scattered surgical clips in the abdomen and pelvis.  Musculoskeletal: No worrisome lytic or sclerotic lesions.  IMPRESSION: 1. Subtotal colectomy and partial left nephrectomy with stable retroperitoneal adenopathy. No evidence of metastatic disease. 2. Retroperitoneal adenopathy, stable. 3. Liver appears fatty. 4. Cholelithiasis. 5. Tiny left renal stone.   Electronically Signed  By: MLorin PicketM.D.     ASSESSMENT AND PLAN:  [facility] MLH1 mutation [id number] an 82% risk of colorectal cancer, 60% risk of endometrial cancer (by age  80), 13% risk of gastric cancer, 12 % risk of ovarian cancer (by age 50), and first degree relatives have a 1 in 2 chance of having this mutation. S/P hysterectomy with bilateral salpingo-oophorectomy in 1993 by Dr. Heide Spark. AND H/O Follicular-type NHL, well-differentiated with CD20 positivity, grade 1 found at the time of sigmoid colon resection in 2007 with 2 involved lymph nodes. AND Colon cancer x 3. S/P subtotal colectomy with ileorectal anastomosis by Dr. Lenise Arena at [facility] on 12/11/2005. She will undergo flexible sigmoidoscopy annually by Dr. Laural Golden and we will follow CEAs AND Renal cancer, S/P left partial nephrectomy by Dr. Beatrix Fetters.   She is currently doing well. We reviewed her CT scans in detail. They are stable.    Will continue surveillance given her Lynch Syndrome with labs every 6 months and annual CT  abd/pelvis imaging.  She would like refills on lomotil. She is due for another mammogram in February.  Orders Placed This Encounter  Procedures  . CT Abdomen Pelvis W Contrast    Standing Status: Future     Number of Occurrences:      Standing Expiration Date: 03/23/2016    Order Specific Question:  If indicated for the ordered procedure, I authorize the administration of contrast media per Radiology protocol    Answer:  Yes    Order Specific Question:  Reason for Exam (SYMPTOM  OR DIAGNOSIS REQUIRED)    Answer:  colon cancer X 3, lynch syndrome    Order Specific Question:  Preferred imaging location?    Answer:  [facility]   All questions were answered. The patient knows to call the clinic with any problems, questions or concerns. We can certainly see the patient much sooner if necessary.  This document serves as a record of services personally performed by Ancil Linsey, MD. It was created on her behalf by Toni Amend, a trained medical scribe. The creation of this record is based on the scribe's personal observations and the provider's statements to them. This document has been checked and approved by the attending provider.  I have reviewed the above documentation for accuracy and completeness, and I agree with the above.  Molli Hazard, MD  05/05/2015

## 2015-03-24 NOTE — Patient Instructions (Signed)
[place] at [facility] Discharge Instructions  RECOMMENDATIONS MADE BY THE CONSULTANT AND ANY TEST RESULTS WILL BE SENT TO YOUR REFERRING PHYSICIAN.  We will give you a prescription for lomotil.   You will be due for your mammogram in February, please be sure to have that done.   Your scans looked stable and clear.   Return in one year for follow up with the MD and for scans.    Thank you for choosing [place] at [facility] to provide your oncology and hematology care.  To afford each patient quality time with our provider, please arrive at least 15 minutes before your scheduled appointment time.    You need to re-schedule your appointment should you arrive 10 or more minutes late.  We strive to give you quality time with our providers, and arriving late affects you and other patients whose appointments are after yours.  Also, if you no show three or more times for appointments you may be dismissed from the clinic at the providers discretion.     Again, thank you for choosing [facility].  Our hope is that these requests will decrease the amount of time that you wait before being seen by our physicians.       _____________________________________________________________  Should you have questions after your visit to [facility], please contact our office at [phone] between the hours of 8:30 a.m. and 4:30 p.m.  Voicemails left after 4:30 p.m. will not be returned until the following business day.  For prescription refill requests, have your pharmacy contact our office.

## 2015-03-25 LAB — CEA: CEA: 3 ng/mL (ref 0.0–4.7)

## 2015-03-26 LAB — BETA 2 MICROGLOBULIN, SERUM: Beta-2 Microglobulin: 2.1 mg/L (ref 0.6–2.4)

## 2015-04-12 ENCOUNTER — Encounter (HOSPITAL_BASED_OUTPATIENT_CLINIC_OR_DEPARTMENT_OTHER): Payer: PPO

## 2015-04-12 VITALS — BP 158/71 | HR 88 | Temp 97.5°F | Resp 18

## 2015-04-12 DIAGNOSIS — E538 Deficiency of other specified B group vitamins: Secondary | ICD-10-CM

## 2015-04-12 MED ORDER — CYANOCOBALAMIN 1000 MCG/ML IJ SOLN
1000.0000 ug | Freq: Once | INTRAMUSCULAR | Status: AC
  Administered 2015-04-12: 1000 ug via INTRAMUSCULAR

## 2015-04-12 MED ORDER — CYANOCOBALAMIN 1000 MCG/ML IJ SOLN
INTRAMUSCULAR | Status: AC
  Filled 2015-04-12: qty 1

## 2015-04-12 NOTE — Patient Instructions (Signed)
[facility] at [facility] Discharge Instructions  RECOMMENDATIONS MADE BY THE CONSULTANT AND ANY TEST RESULTS WILL BE SENT TO YOUR REFERRING PHYSICIAN.  B12 today.     Thank you for choosing [facility] at [facility] to provide your oncology and hematology care.  To afford each patient quality time with our provider, please arrive at least 15 minutes before your scheduled appointment time.   Beginning January 23rd 2017 lab work for the Cancer Center will be done in the  Main lab at [place] on 1st floor. If you have a lab appointment with the Cancer Center please come in thru the  Main Entrance and check in at the main information desk  You need to re-schedule your appointment should you arrive 10 or more minutes late.  We strive to give you quality time with our providers, and arriving late affects you and other patients whose appointments are after yours.  Also, if you no show three or more times for appointments you may be dismissed from the clinic at the providers discretion.     Again, thank you for choosing [facility].  Our hope is that these requests will decrease the amount of time that you wait before being seen by our physicians.       _____________________________________________________________  Should you have questions after your visit to [facility], please contact our office at [phone] between the hours of 8:30 a.m. and 4:30 p.m.  Voicemails left after 4:30 p.m. will not be returned until the following business day.  For prescription refill requests, have your pharmacy contact our office.     

## 2015-04-12 NOTE — Progress Notes (Signed)
Nicole [name] presents today for injection per MD orders. B12 1000mcg administered IM in left Upper Arm. Administration without incident. Patient tolerated well.  

## 2015-05-05 ENCOUNTER — Encounter (HOSPITAL_COMMUNITY): Payer: Self-pay | Admitting: Hematology & Oncology

## 2015-05-10 ENCOUNTER — Encounter (HOSPITAL_COMMUNITY): Payer: PPO | Attending: Hematology & Oncology

## 2015-05-10 VITALS — BP 135/78 | HR 95 | Temp 98.7°F | Resp 20

## 2015-05-10 DIAGNOSIS — Z85038 Personal history of other malignant neoplasm of large intestine: Secondary | ICD-10-CM | POA: Insufficient documentation

## 2015-05-10 DIAGNOSIS — C649 Malignant neoplasm of unspecified kidney, except renal pelvis: Secondary | ICD-10-CM | POA: Insufficient documentation

## 2015-05-10 DIAGNOSIS — E538 Deficiency of other specified B group vitamins: Secondary | ICD-10-CM | POA: Insufficient documentation

## 2015-05-10 DIAGNOSIS — R197 Diarrhea, unspecified: Secondary | ICD-10-CM | POA: Insufficient documentation

## 2015-05-10 DIAGNOSIS — C8590 Non-Hodgkin lymphoma, unspecified, unspecified site: Secondary | ICD-10-CM | POA: Insufficient documentation

## 2015-05-10 MED ORDER — CYANOCOBALAMIN 1000 MCG/ML IJ SOLN
1000.0000 ug | Freq: Once | INTRAMUSCULAR | Status: AC
  Administered 2015-05-10: 1000 ug via INTRAMUSCULAR

## 2015-05-10 MED ORDER — CYANOCOBALAMIN 1000 MCG/ML IJ SOLN
INTRAMUSCULAR | Status: AC
  Filled 2015-05-10: qty 1

## 2015-05-10 NOTE — Patient Instructions (Signed)
[facility] at [facility] Discharge Instructions  RECOMMENDATIONS MADE BY THE CONSULTANT AND ANY TEST RESULTS WILL BE SENT TO YOUR REFERRING PHYSICIAN.  Vitamin B12 1000 mcg injection given today as ordered. Return as scheduled.  Thank you for choosing [facility] at [facility] to provide your oncology and hematology care.  To afford each patient quality time with our provider, please arrive at least 15 minutes before your scheduled appointment time.   Beginning January 23rd 2017 lab work for the Cancer Center will be done in the  Main lab at [place] on 1st floor. If you have a lab appointment with the Cancer Center please come in thru the  Main Entrance and check in at the main information desk  You need to re-schedule your appointment should you arrive 10 or more minutes late.  We strive to give you quality time with our providers, and arriving late affects you and other patients whose appointments are after yours.  Also, if you no show three or more times for appointments you may be dismissed from the clinic at the providers discretion.     Again, thank you for choosing [facility].  Our hope is that these requests will decrease the amount of time that you wait before being seen by our physicians.       _____________________________________________________________  Should you have questions after your visit to [facility], please contact our office at [phone] between the hours of 8:30 a.m. and 4:30 p.m.  Voicemails left after 4:30 p.m. will not be returned until the following business day.  For prescription refill requests, have your pharmacy contact our office.    

## 2015-05-10 NOTE — Progress Notes (Signed)
Nicole [name] presents today for injection per MD orders. B12 1000mcg administered IM in right Upper Arm. Administration without incident. Patient tolerated well.  

## 2015-06-07 ENCOUNTER — Encounter (HOSPITAL_COMMUNITY): Payer: PPO

## 2015-06-09 DIAGNOSIS — H5203 Hypermetropia, bilateral: Secondary | ICD-10-CM | POA: Diagnosis not present

## 2015-06-09 DIAGNOSIS — Z961 Presence of intraocular lens: Secondary | ICD-10-CM | POA: Diagnosis not present

## 2015-06-09 DIAGNOSIS — H52222 Regular astigmatism, left eye: Secondary | ICD-10-CM | POA: Diagnosis not present

## 2015-06-09 DIAGNOSIS — H524 Presbyopia: Secondary | ICD-10-CM | POA: Diagnosis not present

## 2015-06-12 ENCOUNTER — Encounter (HOSPITAL_COMMUNITY): Payer: PPO | Attending: Hematology & Oncology

## 2015-06-12 VITALS — BP 161/98 | HR 95 | Temp 98.4°F | Resp 20

## 2015-06-12 DIAGNOSIS — E538 Deficiency of other specified B group vitamins: Secondary | ICD-10-CM | POA: Insufficient documentation

## 2015-06-12 DIAGNOSIS — R197 Diarrhea, unspecified: Secondary | ICD-10-CM | POA: Insufficient documentation

## 2015-06-12 DIAGNOSIS — Z85038 Personal history of other malignant neoplasm of large intestine: Secondary | ICD-10-CM | POA: Insufficient documentation

## 2015-06-12 DIAGNOSIS — C8590 Non-Hodgkin lymphoma, unspecified, unspecified site: Secondary | ICD-10-CM | POA: Insufficient documentation

## 2015-06-12 DIAGNOSIS — C649 Malignant neoplasm of unspecified kidney, except renal pelvis: Secondary | ICD-10-CM | POA: Insufficient documentation

## 2015-06-12 MED ORDER — CYANOCOBALAMIN 1000 MCG/ML IJ SOLN
INTRAMUSCULAR | Status: AC
  Filled 2015-06-12: qty 1

## 2015-06-12 MED ORDER — CYANOCOBALAMIN 1000 MCG/ML IJ SOLN
1000.0000 ug | Freq: Once | INTRAMUSCULAR | Status: AC
  Administered 2015-06-12: 1000 ug via INTRAMUSCULAR

## 2015-06-12 NOTE — Patient Instructions (Signed)
[facility] at [facility] Discharge Instructions  RECOMMENDATIONS MADE BY THE CONSULTANT AND ANY TEST RESULTS WILL BE SENT TO YOUR REFERRING PHYSICIAN.  Vitamin B12 1000 mcg injection given as ordered.  Thank you for choosing [facility] at [facility] to provide your oncology and hematology care.  To afford each patient quality time with our provider, please arrive at least 15 minutes before your scheduled appointment time.   Beginning January 23rd 2017 lab work for the Cancer Center will be done in the  Main lab at [place] on 1st floor. If you have a lab appointment with the Cancer Center please come in thru the  Main Entrance and check in at the main information desk  You need to re-schedule your appointment should you arrive 10 or more minutes late.  We strive to give you quality time with our providers, and arriving late affects you and other patients whose appointments are after yours.  Also, if you no show three or more times for appointments you may be dismissed from the clinic at the providers discretion.     Again, thank you for choosing [facility].  Our hope is that these requests will decrease the amount of time that you wait before being seen by our physicians.       _____________________________________________________________  Should you have questions after your visit to  Cancer Center, please contact our office at [phone] between the hours of 8:30 a.m. and 4:30 p.m.  Voicemails left after 4:30 p.m. will not be returned until the following business day.  For prescription refill requests, have your pharmacy contact our office.         Resources For Cancer Patients and their Caregivers ? American Cancer Society: Can assist with transportation, wigs, general needs, runs Look Good Feel Better.        1-888-227-6333 ? Cancer Care: Provides financial assistance, online support groups,  medication/co-pay assistance.  1-800-813-HOPE (4673) ? [facility] Assists [place] cancer patients and their families through emotional , educational and financial support.  336-427-4357 ? [place] DSS Where to apply for food stamps, Medicaid and utility assistance. 336-342-1394 ? [employer]: Transportation to medical appointments. 336-347-2287 ? Social Security Administration: May apply for disability if have a Stage IV cancer. 336-342-7796 1-800-772-1213 ? [place] Aging, Disability and Transit Services: Assists with nutrition, care and transit needs. 336-349-2343 

## 2015-06-12 NOTE — Progress Notes (Signed)
Nicole [name] presents today for injection per MD orders. B12 1000mcg administered IM in right Upper Arm. Administration without incident. Patient tolerated well.  

## 2015-06-28 DIAGNOSIS — J111 Influenza due to unidentified influenza virus with other respiratory manifestations: Secondary | ICD-10-CM | POA: Diagnosis not present

## 2015-06-28 DIAGNOSIS — N390 Urinary tract infection, site not specified: Secondary | ICD-10-CM | POA: Diagnosis not present

## 2015-06-29 ENCOUNTER — Encounter (INDEPENDENT_AMBULATORY_CARE_PROVIDER_SITE_OTHER): Payer: Self-pay | Admitting: *Deleted

## 2015-07-06 ENCOUNTER — Encounter (INDEPENDENT_AMBULATORY_CARE_PROVIDER_SITE_OTHER): Payer: Self-pay | Admitting: *Deleted

## 2015-07-06 ENCOUNTER — Other Ambulatory Visit (INDEPENDENT_AMBULATORY_CARE_PROVIDER_SITE_OTHER): Payer: Self-pay | Admitting: Internal Medicine

## 2015-07-06 DIAGNOSIS — Z85038 Personal history of other malignant neoplasm of large intestine: Secondary | ICD-10-CM

## 2015-07-10 ENCOUNTER — Encounter (HOSPITAL_COMMUNITY): Payer: PPO | Attending: Hematology & Oncology

## 2015-07-10 VITALS — BP 160/68 | HR 83 | Temp 98.0°F | Resp 20

## 2015-07-10 DIAGNOSIS — E538 Deficiency of other specified B group vitamins: Secondary | ICD-10-CM | POA: Diagnosis not present

## 2015-07-10 MED ORDER — CYANOCOBALAMIN 1000 MCG/ML IJ SOLN
1000.0000 ug | Freq: Once | INTRAMUSCULAR | Status: AC
  Administered 2015-07-10: 1000 ug via INTRAMUSCULAR

## 2015-07-10 NOTE — Patient Instructions (Signed)
[place] at [facility] Discharge Instructions  RECOMMENDATIONS MADE BY THE CONSULTANT AND ANY TEST RESULTS WILL BE SENT TO YOUR REFERRING PHYSICIAN.  Vitamin B12 1000 mcg injection given today as ordered.  Thank you for choosing [place] at [facility] to provide your oncology and hematology care.  To afford each patient quality time with our provider, please arrive at least 15 minutes before your scheduled appointment time.   Beginning January 23rd 2017 lab work for the [facility] will be done in the  Main lab at [facility] on 1st floor. If you have a lab appointment with the [place] please come in thru the  Main Entrance and check in at the main information desk  You need to re-schedule your appointment should you arrive 10 or more minutes late.  We strive to give you quality time with our providers, and arriving late affects you and other patients whose appointments are after yours.  Also, if you no show three or more times for appointments you may be dismissed from the clinic at the providers discretion.     Again, thank you for choosing [facility].  Our hope is that these requests will decrease the amount of time that you wait before being seen by our physicians.       _____________________________________________________________  Should you have questions after your visit to [facility], please contact our office at [phone] between the hours of 8:30 a.m. and 4:30 p.m.  Voicemails left after 4:30 p.m. will not be returned until the following business day.  For prescription refill requests, have your pharmacy contact our office.         Resources For Cancer Patients and their Caregivers ? American Cancer Society: Can assist with transportation, wigs, general needs, runs Look Good Feel Better.        (854)741-4899 ? Cancer Care: Provides financial assistance, online support groups,  medication/co-pay assistance.  1-800-813-HOPE 650-789-8990) ? [facility] Assists [place] cancer patients and their families through emotional , educational and financial support.  902-414-6816 ? [place] DSS Where to apply for food stamps, Medicaid and utility assistance. 940 138 2998 ? RCATS: Transportation to medical appointments. (709)264-2025 ? Social Security Administration: May apply for disability if have a Stage IV cancer. 860-160-9256 402-275-6923 ? [employer], Disability and Transit Services: Assists with nutrition, care and transit needs. (979)175-3226

## 2015-07-10 NOTE — Progress Notes (Signed)
Nicole [name] presents today for injection per MD orders. B12 1000mcg administered IM in left Upper Arm. Administration without incident. Patient tolerated well.  

## 2015-07-12 ENCOUNTER — Encounter (HOSPITAL_COMMUNITY): Payer: PPO

## 2015-07-13 ENCOUNTER — Encounter (HOSPITAL_COMMUNITY): Payer: PPO

## 2015-07-13 ENCOUNTER — Telehealth (INDEPENDENT_AMBULATORY_CARE_PROVIDER_SITE_OTHER): Payer: Self-pay | Admitting: *Deleted

## 2015-07-13 NOTE — Telephone Encounter (Signed)
Referring MD/PCP: knowlton   Procedure: flex sig  Reason/Indication:  Hx colon ca  Has patient had this procedure before?  Yes, 06/2014  If so, when, by whom and where?    Is there a family history of colon cancer?  yes  Who?  What age when diagnosed?    Is patient diabetic?   no      Does patient have prosthetic heart valve or mechanical valve? no  Do you have a pacemaker?  no  Has patient ever had endocarditis? no  Has patient had joint replacement within last 12 months?  no  Does patient tend to be constipated or take laxatives? no  Does patient have a history of alcohol/drug use?  no  Is patient on Coumadin, Plavix and/or Aspirin? no  Medications: see epic  Allergies: see epic  Medication Adjustment:   Procedure date & time: 08/11/15 at 730

## 2015-07-14 NOTE — Telephone Encounter (Signed)
agree

## 2015-07-21 DIAGNOSIS — R600 Localized edema: Secondary | ICD-10-CM | POA: Diagnosis not present

## 2015-07-21 DIAGNOSIS — E559 Vitamin D deficiency, unspecified: Secondary | ICD-10-CM | POA: Diagnosis not present

## 2015-07-21 DIAGNOSIS — E7800 Pure hypercholesterolemia, unspecified: Secondary | ICD-10-CM | POA: Diagnosis not present

## 2015-07-21 DIAGNOSIS — E6609 Other obesity due to excess calories: Secondary | ICD-10-CM | POA: Diagnosis not present

## 2015-08-07 ENCOUNTER — Encounter (HOSPITAL_COMMUNITY): Payer: PPO | Attending: Hematology & Oncology

## 2015-08-07 DIAGNOSIS — C8590 Non-Hodgkin lymphoma, unspecified, unspecified site: Secondary | ICD-10-CM | POA: Insufficient documentation

## 2015-08-07 DIAGNOSIS — Z85038 Personal history of other malignant neoplasm of large intestine: Secondary | ICD-10-CM | POA: Insufficient documentation

## 2015-08-07 DIAGNOSIS — R197 Diarrhea, unspecified: Secondary | ICD-10-CM | POA: Insufficient documentation

## 2015-08-07 DIAGNOSIS — E538 Deficiency of other specified B group vitamins: Secondary | ICD-10-CM | POA: Insufficient documentation

## 2015-08-07 DIAGNOSIS — C649 Malignant neoplasm of unspecified kidney, except renal pelvis: Secondary | ICD-10-CM | POA: Insufficient documentation

## 2015-08-09 ENCOUNTER — Encounter (HOSPITAL_BASED_OUTPATIENT_CLINIC_OR_DEPARTMENT_OTHER): Payer: PPO

## 2015-08-09 ENCOUNTER — Encounter (HOSPITAL_COMMUNITY): Payer: PPO

## 2015-08-09 ENCOUNTER — Encounter (HOSPITAL_COMMUNITY): Payer: Self-pay

## 2015-08-09 VITALS — BP 145/72 | HR 72 | Temp 98.0°F | Resp 18

## 2015-08-09 DIAGNOSIS — E538 Deficiency of other specified B group vitamins: Secondary | ICD-10-CM

## 2015-08-09 MED ORDER — CYANOCOBALAMIN 1000 MCG/ML IJ SOLN
1000.0000 ug | Freq: Once | INTRAMUSCULAR | Status: AC
  Administered 2015-08-09: 1000 ug via INTRAMUSCULAR

## 2015-08-09 MED ORDER — CYANOCOBALAMIN 1000 MCG/ML IJ SOLN
INTRAMUSCULAR | Status: AC
  Filled 2015-08-09: qty 1

## 2015-08-09 NOTE — Patient Instructions (Signed)
[place] at [facility] Discharge Instructions  RECOMMENDATIONS MADE BY THE CONSULTANT AND ANY TEST RESULTS WILL BE SENT TO YOUR REFERRING PHYSICIAN.  B12 injection today. Return as scheduled for injections, lab work, and office visit.  Thank you for choosing [place] at [facility] to provide your oncology and hematology care.  To afford each patient quality time with our provider, please arrive at least 15 minutes before your scheduled appointment time.   Beginning January 23rd 2017 lab work for the [facility] will be done in the  Main lab at [facility] on 1st floor. If you have a lab appointment with the [place] please come in thru the  Main Entrance and check in at the main information desk  You need to re-schedule your appointment should you arrive 10 or more minutes late.  We strive to give you quality time with our providers, and arriving late affects you and other patients whose appointments are after yours.  Also, if you no show three or more times for appointments you may be dismissed from the clinic at the providers discretion.     Again, thank you for choosing [facility].  Our hope is that these requests will decrease the amount of time that you wait before being seen by our physicians.       _____________________________________________________________  Should you have questions after your visit to [facility], please contact our office at [phone] between the hours of 8:30 a.m. and 4:30 p.m.  Voicemails left after 4:30 p.m. will not be returned until the following business day.  For prescription refill requests, have your pharmacy contact our office.         Resources For Cancer Patients and their Caregivers ? American Cancer Society: Can assist with transportation, wigs, general needs, runs Look Good Feel Better.        [phone] ? Cancer Care: Provides financial assistance,  online support groups, medication/co-pay assistance.  1-800-813-HOPE 850-109-8574) ? [place] Assists [place] cancer patients and their families through emotional , educational and financial support.  504-699-7362 ? [place] DSS Where to apply for food stamps, Medicaid and utility assistance. 279-479-6971 ? RCATS: Transportation to medical appointments. 7792417940 ? Social Security Administration: May apply for disability if have a Stage IV cancer. 310-338-1631 806-079-7411 ? [employer], Disability and Transit Services: Assists with nutrition, care and transit needs. [place] Support Programs: @10RELATIVEDAYS @ > Cancer Support Group  2nd Tuesday of the month 1pm-2pm, Journey Room  > Creative Journey  3rd Tuesday of the month 1130am-1pm, Journey Room  > Look Good Feel Better  1st Wednesday of the month 10am-12 noon, Journey Room (Call [place] to register [phone])

## 2015-08-10 MED ORDER — SODIUM CHLORIDE 0.9 % IV SOLN: INTRAVENOUS | Status: DC

## 2015-08-11 ENCOUNTER — Encounter (HOSPITAL_COMMUNITY): Payer: Self-pay | Admitting: *Deleted

## 2015-08-11 ENCOUNTER — Encounter (HOSPITAL_COMMUNITY)
Admission: RE | Disposition: A | Discharge status: Home / Self Care | Payer: Self-pay | Source: Ambulatory Visit | Attending: Internal Medicine

## 2015-08-11 ENCOUNTER — Ambulatory Visit (HOSPITAL_COMMUNITY): Admit: 2015-08-11 | Payer: Self-pay | Admitting: Internal Medicine

## 2015-08-11 ENCOUNTER — Ambulatory Visit (HOSPITAL_COMMUNITY)
Admission: RE | Admit: 2015-08-11 | Discharge: 2015-08-11 | Disposition: A | Discharge status: Home / Self Care | Payer: PPO | Source: Ambulatory Visit | Attending: Internal Medicine | Admitting: Internal Medicine

## 2015-08-11 ENCOUNTER — Encounter (HOSPITAL_COMMUNITY): Payer: Self-pay

## 2015-08-11 DIAGNOSIS — Z791 Long term (current) use of non-steroidal anti-inflammatories (NSAID): Secondary | ICD-10-CM | POA: Diagnosis not present

## 2015-08-11 DIAGNOSIS — Z885 Allergy status to narcotic agent status: Secondary | ICD-10-CM | POA: Insufficient documentation

## 2015-08-11 DIAGNOSIS — Z8614 Personal history of Methicillin resistant Staphylococcus aureus infection: Secondary | ICD-10-CM | POA: Diagnosis not present

## 2015-08-11 DIAGNOSIS — Z85528 Personal history of other malignant neoplasm of kidney: Secondary | ICD-10-CM | POA: Insufficient documentation

## 2015-08-11 DIAGNOSIS — Z85038 Personal history of other malignant neoplasm of large intestine: Secondary | ICD-10-CM | POA: Insufficient documentation

## 2015-08-11 DIAGNOSIS — Z8572 Personal history of non-Hodgkin lymphomas: Secondary | ICD-10-CM | POA: Diagnosis not present

## 2015-08-11 DIAGNOSIS — Z1509 Genetic susceptibility to other malignant neoplasm: Secondary | ICD-10-CM | POA: Insufficient documentation

## 2015-08-11 DIAGNOSIS — Z79899 Other long term (current) drug therapy: Secondary | ICD-10-CM | POA: Diagnosis not present

## 2015-08-11 DIAGNOSIS — Z09 Encounter for follow-up examination after completed treatment for conditions other than malignant neoplasm: Secondary | ICD-10-CM | POA: Diagnosis not present

## 2015-08-11 DIAGNOSIS — Z9049 Acquired absence of other specified parts of digestive tract: Secondary | ICD-10-CM | POA: Diagnosis not present

## 2015-08-11 DIAGNOSIS — E538 Deficiency of other specified B group vitamins: Secondary | ICD-10-CM | POA: Diagnosis not present

## 2015-08-11 DIAGNOSIS — Z1211 Encounter for screening for malignant neoplasm of colon: Secondary | ICD-10-CM | POA: Insufficient documentation

## 2015-08-11 DIAGNOSIS — Z86010 Personal history of colonic polyps: Secondary | ICD-10-CM | POA: Diagnosis not present

## 2015-08-11 DIAGNOSIS — Z91040 Latex allergy status: Secondary | ICD-10-CM | POA: Insufficient documentation

## 2015-08-11 DIAGNOSIS — K648 Other hemorrhoids: Secondary | ICD-10-CM | POA: Diagnosis not present

## 2015-08-11 HISTORY — PX: FLEXIBLE SIGMOIDOSCOPY: SHX5431

## 2015-08-11 SURGERY — BRONCHOSCOPY, FLEXIBLE
Anesthesia: Moderate Sedation

## 2015-08-11 SURGERY — SIGMOIDOSCOPY, FLEXIBLE
Anesthesia: Moderate Sedation

## 2015-08-11 NOTE — Op Note (Signed)
[facility] Patient Name: Nicole [name] Procedure Date: 08/11/2015 7:28 AM MRN: [id number] Date of Birth: [birth year] Attending MD: Hildred Laser , MD CSN: [id number] Age: 65 Admit Type: Outpatient Procedure:                Flexible Sigmoidoscopy Indications:              High risk colon cancer surveillance: Personal                            history of colon cancer, High risk colon cancer                            surveillance: Personal history of hereditary                            nonpolyposis colorectal cancer (Lynch Syndrome) Providers:                Hildred Laser, MD, Gwenlyn Fudge, RN, Isabella Stalling, Technician Referring MD:             Newt Minion, MD Medicines:                None Complications:            No immediate complications. Estimated Blood Loss:     Estimated blood loss: none. Procedure:                After obtaining informed consent, the scope was                            passed under direct vision. The [id number]                            [id number]) scope was introduced through the anus and                            advanced to the the ileo-sigmoid anastomosis. The                            flexible sigmoidoscopy was accomplished without                            difficulty. The patient tolerated the procedure                            well. The quality of the bowel preparation was                            excellent. Scope In: 7:39:00 AM Scope Out: 7:44:06 AM Total Procedure Duration: 0 hours 5 minutes 6 seconds  Findings:      Internal hemorrhoids were found during retroflexion. The hemorrhoids       were small.      The exam was otherwise without abnormality. Impression:               -  Internal hemorrhoids.                           - The examination was otherwise normal(distal small                            bowel, [name] colonic anastomosis andrectum)                           - No specimens  collected. Moderate Sedation:      not administered Recommendation:           - Discharge patient to home (ambulatory).                           - Resume previous diet today.                           - Continue present medications.                           - Repeat flexible sigmoidoscopy in 1 year for                            surveillance. Procedure Code(s):        --- Professional ---                           [phone], Sigmoidoscopy, flexible; diagnostic,                            including collection of specimen(s) by brushing or                            washing, when performed (separate procedure) Diagnosis Code(s):        --- Professional ---                           [id number], Personal history of other malignant                            neoplasm of large intestine                           Z15.09, Genetic susceptibility to other malignant                            neoplasm                           K64.8, Other hemorrhoids CPT copyright 2016 American Medical Association. All rights reserved. The codes documented in this report are preliminary and upon coder review may  be revised to meet current compliance requirements. Hildred Laser, MD Hildred Laser, MD 08/11/2015 7:57:58 AM This report has been signed electronically. Number of Addenda: 0

## 2015-08-11 NOTE — Discharge Instructions (Signed)
Resume usual medications and diet. Flexible sigmoidoscopy in one year.  Flexible Sigmoidoscopy Flexible sigmoidoscopy is a procedure to check your lower colon (sigmoid colon). The procedure is done using a short, flexible tube that has a tiny camera attached (sigmoidoscope). The sigmoidoscope is inserted into your anus and passed through your rectum into your sigmoid colon. The camera on the scope sends images to a television monitor.  You may need this exam if you have had changes in your bowel habits, bleeding from your rectum, or abdominal pain. You may also need this test if your health care provider wants to check for abnormal growths in your rectum or lower colon. LET [facility] CARE PROVIDER KNOW ABOUT:  Any allergies you have.  All medicines you are taking, including vitamins, herbs, eye drops, creams, and over-the-counter medicines.  Previous problems you or members of your family have had with the use of anesthetics.  Any blood disorders you have.  Previous surgeries you have had.  Medical conditions you have. RISKS AND COMPLICATIONS Generally, this is a safe procedure. However, as with any procedure, problems can occur. Possible problems include:  Abdominal pain.  Bleeding.  Infection or inflammation in your colon.  A tear through your rectum or colon (perforation). BEFORE THE PROCEDURE  You will need to follow a special diet and use a laxative or an enema before the procedure (bowel prep). This is to clean out your colon. You may need to start your bowel prep 1-3 days before the procedure. Follow your health care provider's instructions exactly.  Do not eat or drink anything after midnight the night before the procedure or as directed by your health care provider.  You may need to have a rectal suppository or enema in the morning before your procedure.  Arrange for someone to take you home after the procedure. PROCEDURE  Sigmoidoscopy takes about 20 minutes and may  include the following steps:  You may get a medicine through an IV tube to make you relaxed and drowsy (sedation).  You will lie on your side on the examination table.  The sigmoidoscope will be lubricated and gently inserted into your anus.  Air will be injected into your colon and the sigmoidoscope will be moved into your sigmoid colon. You may feel some pressure or cramping.  You may be asked to change positions at times during the procedure.  Your health care provider will check the monitor for any abnormal findings.  Your health care provider may also want to take a small piece of tissue to check under a microscope (biopsy).  Your health care provider takes a final look at your sigmoid colon and rectum as the scope is slowly removed. AFTER THE PROCEDURE  You will stay in a recovery area for a short while until your health care provider says you can go home.   This information is not intended to replace advice given to you by your health care provider. Make sure you discuss any questions you have with your health care provider.   Document Released: 03/01/2000 Document Revised: 03/09/2013 Document Reviewed: 02/04/2013 Elsevier Interactive Patient Education [employer].

## 2015-08-11 NOTE — H&P (Signed)
Nicole [name] is an 65 y.o. female.   Chief Complaint: Patient is here for flexible sigmoidoscopy HPI: Patient is 66 year old Caucasian female was Lynch syndrome and history of colon carcinoma was here for surveillance Lex sigmoidoscopy. She only has rectum and short segment of sigmoid left in place. She denies rectal bleeding melena. She has an average of 3-4 stools per day. Occasionally she may have more.  Past Medical History  Diagnosis Date  . B12 deficiency   . Right shoulder pain S/P ROTATOR CUFF REPAIR  . Chronic diarrhea SECONDARY HX COLON CANCER  . Personal history of renal cancer S/P LEFT PARTIAL NEPHRECTOMY 2007--  NO RECURRENCE    DUE TO KIDNEY CANCER  . History of colon cancer [facility] SYNDROME ---  COLORECTAL CANCER S/P COLECTOMY X3  LAST ONE 2007    NO RECURRENCE  . Arthritis     [place]der  . History of kidney stones   . History of colon cancer 05/04/2013  . History of MRSA infection 08/2013    left leg  . History of kidney infection 08/2013  . Hx of osteopenia   . Diarrhea   . Kidney stone   . Lynch syndrome 10/17/2010    Hereditary colon cancer  . NHL (non-Hodgkin's lymphoma) ([place]) 10/17/2010    MONITORED BY DR Tressie Stalker    Past Surgical History  Procedure Laterality Date  . Hemicolectomy  2001    right  . Hemicolectomy  2002    left  . Kidney resection  2007    left partial  . Subtotal colectomy  2007  . Flexible sigmoidoscopy  08/29/2011    Procedure: FLEXIBLE SIGMOIDOSCOPY;  Surgeon: Rogene Houston, MD;  [facility];  Service: Endoscopy;  Laterality: N/A;  930  . Colonoscopy w/ polypectomy    . Left flank exploration w/  partial left nephrectomy/ left hilar lymph node bx  04-17-2005  DR DAHLSTEDT    RENAL CELL CARCINOMA  . Laparoscopic assisted vaginal hysterectomy  1993    W/ BILATERAL SALPINGO-OOPHORECTOMY  . Right rotator  cuff repair/   bicep repair  10-17-2011  DR SUPPLE  . Appendectomy  1993    W/ LAVH  . Cystoscopy with stent placement   02/07/2012    Procedure: CYSTOSCOPY WITH STENT PLACEMENT;  Surgeon: Franchot Gallo, MD;  Location: [facility];  Service: Urology;  Laterality: Left;  . Lithotripsy  11-22013    x2  . Flexible sigmoidoscopy N/A 09/28/2012    Procedure: FLEXIBLE SIGMOIDOSCOPY;  Surgeon: Rogene Houston, MD;  [facility];  Service: Endoscopy;  Laterality: N/A;  730  . Nephrolithotomy Left 06/09/2014    Procedure: NEPHROLITHOTOMY PERCUTANEOUS;  Surgeon: Franchot Gallo, MD;  [facility];  Service: Urology;  Laterality: Left;  with STENT  . Flexible sigmoidoscopy N/A 07/08/2014    Procedure: FLEXIBLE SIGMOIDOSCOPY;  Surgeon: Rogene Houston, MD;  [facility];  Service: Endoscopy;  Laterality: N/A;  830 - moved to 10:20 - Ann to notify pt  . Ureteroscopy with holmium laser lithotripsy Left 07/14/2014    Procedure: URETEROSCOPY  EXTRACTION OF STONES;  Surgeon: Franchot Gallo, MD;  Location: [facility];  Service: Urology;  Laterality: Left;  . Holmium laser application Left 0000000    Procedure: HOLMIUM LASER LITHOTRIPSY,;  Surgeon: Franchot Gallo, MD;  Location: [facility];  Service: Urology;  Laterality: Left;  . Cystoscopy w/ ureteral stent removal Left 07/14/2014    Procedure: CYSTOSCOPY WITH STENT REMOVAL;  Surgeon: Franchot Gallo, MD;  Location: [facility];  Service: Urology;  Laterality: Left;  . Cystoscopy with stent placement Left 07/14/2014    Procedure: CYSTOSCOPY WITH STENT PLACEMENT;  Surgeon: Franchot Gallo, MD;  Location: [facility];  Service: Urology;  Laterality: Left;  . Cystoscopy w/ retrogrades  07/14/2014    Procedure: CYSTOSCOPY WITH RETROGRADE PYELOGRAM;  Surgeon: Franchot Gallo, MD;  Location: [facility];  Service: Urology;;    Family History  Problem Relation Age of Onset  . Cancer Son    Social History:  reports that she has never smoked. She has never  used smokeless tobacco. She reports that she does not drink alcohol or use illicit drugs.  Allergies:  Allergies  Allergen Reactions  . Latex Rash  . Morphine And Related Other (See Comments)    hallucinations  . Tape Rash    Certain tapes     Medications Prior to Admission  Medication Sig Dispense Refill  . atorvastatin (LIPITOR) 40 MG tablet Take 20 mg by mouth daily.     . Cholecalciferol 5000 UNITS capsule Take 5,000 Units by mouth daily.    . cyanocobalamin (,VITAMIN B-12,) 1000 MCG/ML injection Inject 1 mL (1,000 mcg total) into the muscle once. (Patient taking differently: Inject 1,000 mcg into the muscle every 30 (thirty) days. ) 1 mL 11  . diphenoxylate-atropine (LOMOTIL) 2.5-0.025 MG tablet Take 2 tablets by mouth 3 (three) times daily before meals. 180 tablet 11  . ibuprofen (ADVIL,MOTRIN) 200 MG tablet Take 600 mg by mouth every 6 (six) hours as needed (Pain).     Marland Kitchen POTASSIUM CITRATE PO Take 1,620 mg by mouth 3 (three) times daily.    Marland Kitchen lactobacillus acidophilus (BACID) TABS tablet Take 1 tablet by mouth daily.      No results found for this or any previous visit (from the past 48 hour(s)). No results found.  ROS  Blood pressure 133/74, pulse 91, temperature 98.5 F (36.9 C), temperature source Oral, resp. rate 19, height 5\' 1"  (1.549 m), weight 192 lb (87.091 kg), SpO2 97 %. Physical Exam  Constitutional: She appears well-developed and well-nourished.  HENT:  Mouth/Throat: Oropharynx is clear and moist.  Eyes: Conjunctivae are normal. No scleral icterus.  Neck: No thyromegaly present.  Cardiovascular: Normal rate, regular rhythm and normal heart sounds.   No murmur heard. Respiratory: Effort normal and breath sounds normal.  GI: Soft. She exhibits no distension and no mass. There is no tenderness.  Musculoskeletal: She exhibits no edema.  Lymphadenopathy:    She has no cervical adenopathy.  Neurological: She is alert.  Skin: Skin is warm and dry.      Assessment/Plan History of CRC and limb syndrome. Surveillance flexible sigmoidoscopy.  Hildred Laser, MD 08/11/2015, 7:32 AM

## 2015-08-18 ENCOUNTER — Encounter (HOSPITAL_COMMUNITY): Payer: Self-pay | Admitting: Internal Medicine

## 2015-08-29 ENCOUNTER — Other Ambulatory Visit: Payer: Self-pay | Admitting: Urology

## 2015-08-29 ENCOUNTER — Other Ambulatory Visit (HOSPITAL_COMMUNITY)
Admission: RE | Admit: 2015-08-29 | Discharge: 2015-08-29 | Disposition: A | Discharge status: Home / Self Care | Payer: PPO | Source: Ambulatory Visit | Attending: Urology | Admitting: Urology

## 2015-08-29 ENCOUNTER — Ambulatory Visit (INDEPENDENT_AMBULATORY_CARE_PROVIDER_SITE_OTHER): Payer: PPO | Admitting: Urology

## 2015-08-29 ENCOUNTER — Ambulatory Visit (HOSPITAL_COMMUNITY)
Admission: RE | Admit: 2015-08-29 | Discharge: 2015-08-29 | Disposition: A | Discharge status: Home / Self Care | Payer: PPO | Source: Ambulatory Visit | Attending: Urology | Admitting: Urology

## 2015-08-29 DIAGNOSIS — N200 Calculus of kidney: Secondary | ICD-10-CM

## 2015-08-29 DIAGNOSIS — R8271 Bacteriuria: Secondary | ICD-10-CM | POA: Diagnosis not present

## 2015-08-29 LAB — URINALYSIS, ROUTINE W REFLEX MICROSCOPIC
Bilirubin Urine: NEGATIVE
GLUCOSE, UA: NEGATIVE mg/dL
Ketones, ur: NEGATIVE mg/dL
LEUKOCYTES UA: NEGATIVE
NITRITE: NEGATIVE
PH: 5.5 (ref 5.0–8.0)
Protein, ur: NEGATIVE mg/dL
SPECIFIC GRAVITY, URINE: 1.015 (ref 1.005–1.030)

## 2015-08-29 LAB — URINE MICROSCOPIC-ADD ON: WBC UA: NONE SEEN WBC/hpf (ref 0–5)

## 2015-09-11 ENCOUNTER — Encounter (HOSPITAL_COMMUNITY): Payer: PPO | Attending: Hematology & Oncology

## 2015-09-11 VITALS — BP 146/72 | HR 84 | Resp 16

## 2015-09-11 DIAGNOSIS — E538 Deficiency of other specified B group vitamins: Secondary | ICD-10-CM | POA: Diagnosis not present

## 2015-09-11 DIAGNOSIS — C8590 Non-Hodgkin lymphoma, unspecified, unspecified site: Secondary | ICD-10-CM | POA: Insufficient documentation

## 2015-09-11 DIAGNOSIS — C649 Malignant neoplasm of unspecified kidney, except renal pelvis: Secondary | ICD-10-CM | POA: Insufficient documentation

## 2015-09-11 DIAGNOSIS — R197 Diarrhea, unspecified: Secondary | ICD-10-CM | POA: Insufficient documentation

## 2015-09-11 DIAGNOSIS — Z85038 Personal history of other malignant neoplasm of large intestine: Secondary | ICD-10-CM | POA: Insufficient documentation

## 2015-09-11 MED ORDER — CYANOCOBALAMIN 1000 MCG/ML IJ SOLN
INTRAMUSCULAR | Status: AC
  Filled 2015-09-11: qty 1

## 2015-09-11 MED ORDER — CYANOCOBALAMIN 1000 MCG/ML IJ SOLN
1000.0000 ug | Freq: Once | INTRAMUSCULAR | Status: AC
Start: 2015-09-11 — End: 2015-09-11
  Administered 2015-09-11: 1000 ug via INTRAMUSCULAR

## 2015-09-11 NOTE — Patient Instructions (Signed)
[place] at [facility] Discharge Instructions  RECOMMENDATIONS MADE BY THE CONSULTANT AND ANY TEST RESULTS WILL BE SENT TO YOUR REFERRING PHYSICIAN.  B12 given today per orders. Follow up as scheduled  Thank you for choosing [place] at [facility] to provide your oncology and hematology care.  To afford each patient quality time with our provider, please arrive at least 15 minutes before your scheduled appointment time.   Beginning January 23rd 2017 lab work for the [facility] will be done in the  Main lab at [facility] on 1st floor. If you have a lab appointment with the [facility] please come in thru the  Main Entrance and check in at the main information desk  You need to re-schedule your appointment should you arrive 10 or more minutes late.  We strive to give you quality time with our providers, and arriving late affects you and other patients whose appointments are after yours.  Also, if you no show three or more times for appointments you may be dismissed from the clinic at the providers discretion.     Again, thank you for choosing [facility].  Our hope is that these requests will decrease the amount of time that you wait before being seen by our physicians.       _____________________________________________________________  Should you have questions after your visit to [facility], please contact our office at [phone] between the hours of 8:30 a.m. and 4:30 p.m.  Voicemails left after 4:30 p.m. will not be returned until the following business day.  For prescription refill requests, have your pharmacy contact our office.         Resources For Cancer Patients and their Caregivers ? American Cancer Society: Can assist with transportation, wigs, general needs, runs Look Good Feel Better.        [phone] ? Cancer Care: Provides financial assistance, online support groups,  medication/co-pay assistance.  1-800-813-HOPE (208)574-9441) ? [place] Assists [place] cancer patients and their families through emotional , educational and financial support.  [phone] ? [place] DSS Where to apply for food stamps, Medicaid and utility assistance. 217-511-4948 ? RCATS: Transportation to medical appointments. [phone] ? Social Security Administration: May apply for disability if have a Stage IV cancer. [phone] ? [employer], Disability and Transit Services: Assists with nutrition, care and transit needs. [place] Support Programs: @10RELATIVEDAYS @ > Cancer Support Group  2nd Tuesday of the month 1pm-2pm, Journey Room  > Creative Journey  3rd Tuesday of the month 1130am-1pm, Journey Room  > Look Good Feel Better  1st Wednesday of the month 10am-12 noon, Journey Room (Call [place] to register [phone])

## 2015-09-11 NOTE — Progress Notes (Signed)
Nicole [name] presents today for injection per MD orders. B12 1062mcg administered SQ in left Upper Arm. Administration without incident. Patient tolerated well.

## 2015-10-03 DIAGNOSIS — M170 Bilateral primary osteoarthritis of knee: Secondary | ICD-10-CM | POA: Diagnosis not present

## 2015-10-03 DIAGNOSIS — E6609 Other obesity due to excess calories: Secondary | ICD-10-CM | POA: Diagnosis not present

## 2015-10-10 ENCOUNTER — Encounter (HOSPITAL_COMMUNITY): Payer: Self-pay

## 2015-10-10 ENCOUNTER — Encounter (HOSPITAL_COMMUNITY): Payer: PPO | Attending: Hematology & Oncology

## 2015-10-10 VITALS — BP 147/73 | HR 83 | Temp 98.4°F | Resp 18

## 2015-10-10 DIAGNOSIS — Z85038 Personal history of other malignant neoplasm of large intestine: Secondary | ICD-10-CM | POA: Insufficient documentation

## 2015-10-10 DIAGNOSIS — C8590 Non-Hodgkin lymphoma, unspecified, unspecified site: Secondary | ICD-10-CM | POA: Insufficient documentation

## 2015-10-10 DIAGNOSIS — E538 Deficiency of other specified B group vitamins: Secondary | ICD-10-CM

## 2015-10-10 DIAGNOSIS — R197 Diarrhea, unspecified: Secondary | ICD-10-CM | POA: Insufficient documentation

## 2015-10-10 DIAGNOSIS — C649 Malignant neoplasm of unspecified kidney, except renal pelvis: Secondary | ICD-10-CM | POA: Insufficient documentation

## 2015-10-10 MED ORDER — CYANOCOBALAMIN 1000 MCG/ML IJ SOLN
INTRAMUSCULAR | Status: AC
  Filled 2015-10-10: qty 1

## 2015-10-10 MED ORDER — CYANOCOBALAMIN 1000 MCG/ML IJ SOLN
1000.0000 ug | Freq: Once | INTRAMUSCULAR | Status: AC
  Administered 2015-10-10: 1000 ug via INTRAMUSCULAR

## 2015-10-10 NOTE — Patient Instructions (Signed)
[place] at [facility] Discharge Instructions  RECOMMENDATIONS MADE BY THE CONSULTANT AND ANY TEST RESULTS WILL BE SENT TO YOUR REFERRING PHYSICIAN.  b12 injection today Return monthly for your b12 injection today Return as scheduled   Thank you for choosing [place] at [facility] to provide your oncology and hematology care.  To afford each patient quality time with our provider, please arrive at least 15 minutes before your scheduled appointment time.   Beginning January 23rd 2017 lab work for the [facility] will be done in the  Main lab at [facility] on 1st floor. If you have a lab appointment with the [facility] please come in thru the  Main Entrance and check in at the main information desk  You need to re-schedule your appointment should you arrive 10 or more minutes late.  We strive to give you quality time with our providers, and arriving late affects you and other patients whose appointments are after yours.  Also, if you no show three or more times for appointments you may be dismissed from the clinic at the providers discretion.     Again, thank you for choosing [facility]  Our hope is that these requests will decrease the amount of time that you wait before being seen by our physicians.       _____________________________________________________________  Should you have questions after your visit to [facility], please contact our office at [phone] between the hours of 8:30 a.m. and 4:30 p.m.  Voicemails left after 4:30 p.m. will not be returned until the following business day.  For prescription refill requests, have your pharmacy contact our office.         Resources For Cancer Patients and their Caregivers ? American Cancer Society: Can assist with transportation, wigs, general needs, runs Look Good Feel Better.        240-351-2391 ? Cancer Care: Provides financial  assistance, online support groups, medication/co-pay assistance.  1-800-813-HOPE 819-773-0514) ? [place] Assists [place] cancer patients and their families through emotional , educational and financial support.  ([phone] ? [place] DSS Where to apply for food stamps, Medicaid and utility assistance. 5791548431 ? RCATS: Transportation to medical appointments. (252)204-0110 ? Social Security Administration: May apply for disability if have a Stage IV cancer. 8126712897 434-565-5395 ? [employer], Disability and Transit Services: Assists with nutrition, care and transit needs. [place] Support Programs: @10RELATIVEDAYS @ > Cancer Support Group  2nd Tuesday of the month 1pm-2pm, Journey Room  > Creative Journey  3rd Tuesday of the month 1130am-1pm, Journey Room  > Look Good Feel Better  1st Wednesday of the month 10am-12 noon, Journey Room (Call [place] to register [phone])

## 2015-10-10 NOTE — Progress Notes (Signed)
Nicole [name] presents today for injection per the provider's orders.  Vitamin b12  administration without incident; see MAR for injection details.  Patient tolerated procedure well and without incident.  No questions or complaints noted at this time.

## 2015-11-09 ENCOUNTER — Encounter (HOSPITAL_COMMUNITY): Payer: PPO | Attending: Hematology & Oncology

## 2015-11-09 VITALS — BP 147/90 | HR 86 | Temp 98.3°F | Resp 18

## 2015-11-09 DIAGNOSIS — E538 Deficiency of other specified B group vitamins: Secondary | ICD-10-CM

## 2015-11-09 DIAGNOSIS — C649 Malignant neoplasm of unspecified kidney, except renal pelvis: Secondary | ICD-10-CM | POA: Insufficient documentation

## 2015-11-09 DIAGNOSIS — Z85038 Personal history of other malignant neoplasm of large intestine: Secondary | ICD-10-CM | POA: Insufficient documentation

## 2015-11-09 DIAGNOSIS — C8590 Non-Hodgkin lymphoma, unspecified, unspecified site: Secondary | ICD-10-CM | POA: Insufficient documentation

## 2015-11-09 DIAGNOSIS — R197 Diarrhea, unspecified: Secondary | ICD-10-CM | POA: Insufficient documentation

## 2015-11-09 MED ORDER — CYANOCOBALAMIN 1000 MCG/ML IJ SOLN
1000.0000 ug | Freq: Once | INTRAMUSCULAR | Status: AC
  Administered 2015-11-09: 1000 ug via INTRAMUSCULAR

## 2015-11-09 MED ORDER — CYANOCOBALAMIN 1000 MCG/ML IJ SOLN
INTRAMUSCULAR | Status: AC
  Filled 2015-11-09: qty 1

## 2015-11-09 NOTE — Patient Instructions (Signed)
[facility] at [facility] Discharge Instructions  RECOMMENDATIONS MADE BY THE CONSULTANT AND ANY TEST RESULTS WILL BE SENT TO YOUR REFERRING PHYSICIAN.  Received Vit B12 injection today. Follow-up as scheduled. Call clinic for any questions or concerns  Thank you for choosing [facility] at [facility] to provide your oncology and hematology care.  To afford each patient quality time with our provider, please arrive at least 15 minutes before your scheduled appointment time.   Beginning January 23rd 2017 lab work for the Cancer Center will be done in the  Main lab at [place] on 1st floor. If you have a lab appointment with the Cancer Center please come in thru the  Main Entrance and check in at the main information desk  You need to re-schedule your appointment should you arrive 10 or more minutes late.  We strive to give you quality time with our providers, and arriving late affects you and other patients whose appointments are after yours.  Also, if you no show three or more times for appointments you may be dismissed from the clinic at the providers discretion.     Again, thank you for choosing [facility].  Our hope is that these requests will decrease the amount of time that you wait before being seen by our physicians.       _____________________________________________________________  Should you have questions after your visit to  Cancer Center, please contact our office at [phone] between the hours of 8:30 a.m. and 4:30 p.m.  Voicemails left after 4:30 p.m. will not be returned until the following business day.  For prescription refill requests, have your pharmacy contact our office.         Resources For Cancer Patients and their Caregivers ? American Cancer Society: Can assist with transportation, wigs, general needs, runs Look Good Feel Better.        1-888-227-6333 ? Cancer Care: Provides  financial assistance, online support groups, medication/co-pay assistance.  1-800-813-HOPE (4673) ? [facility] Assists [place] cancer patients and their families through emotional , educational and financial support.  336-427-4357 ? [place] DSS Where to apply for food stamps, Medicaid and utility assistance. 336-342-1394 ? RCATS: Transportation to medical appointments. 336-347-2287 ? Social Security Administration: May apply for disability if have a Stage IV cancer. 336-342-7796 1-800-772-1213 ? [place] Aging, Disability and Transit Services: Assists with nutrition, care and transit needs. 336-349-2343  Cancer Center Support Programs: @10RELATIVEDAYS@ > Cancer Support Group  2nd Tuesday of the month 1pm-2pm, Journey Room  > Creative Journey  3rd Tuesday of the month 1130am-1pm, Journey Room  > Look Good Feel Better  1st Wednesday of the month 10am-12 noon, Journey Room (Call American Cancer Society to register 1-800-395-5775)   

## 2015-11-10 ENCOUNTER — Ambulatory Visit (HOSPITAL_COMMUNITY): Payer: PPO

## 2015-12-11 ENCOUNTER — Encounter (HOSPITAL_COMMUNITY): Payer: PPO | Attending: Hematology & Oncology

## 2015-12-11 VITALS — BP 151/89 | HR 97 | Temp 97.7°F | Resp 18

## 2015-12-11 DIAGNOSIS — E538 Deficiency of other specified B group vitamins: Secondary | ICD-10-CM | POA: Diagnosis not present

## 2015-12-11 DIAGNOSIS — C649 Malignant neoplasm of unspecified kidney, except renal pelvis: Secondary | ICD-10-CM | POA: Insufficient documentation

## 2015-12-11 DIAGNOSIS — C8590 Non-Hodgkin lymphoma, unspecified, unspecified site: Secondary | ICD-10-CM | POA: Insufficient documentation

## 2015-12-11 DIAGNOSIS — Z85038 Personal history of other malignant neoplasm of large intestine: Secondary | ICD-10-CM | POA: Insufficient documentation

## 2015-12-11 DIAGNOSIS — R197 Diarrhea, unspecified: Secondary | ICD-10-CM | POA: Insufficient documentation

## 2015-12-11 MED ORDER — CYANOCOBALAMIN 1000 MCG/ML IJ SOLN
1000.0000 ug | Freq: Once | INTRAMUSCULAR | Status: AC
  Administered 2015-12-11: 1000 ug via INTRAMUSCULAR

## 2015-12-11 MED ORDER — CYANOCOBALAMIN 1000 MCG/ML IJ SOLN
INTRAMUSCULAR | Status: AC
  Filled 2015-12-11: qty 1

## 2015-12-11 NOTE — Patient Instructions (Signed)
[facility] at [facility] Discharge Instructions  RECOMMENDATIONS MADE BY THE CONSULTANT AND ANY TEST RESULTS WILL BE SENT TO YOUR REFERRING PHYSICIAN.  B12 today.    Thank you for choosing [facility] at  [facility] to provide your oncology and hematology care.  To afford each patient quality time with our provider, please arrive at least 15 minutes before your scheduled appointment time.   Beginning January 23rd 2017 lab work for the Cancer Center will be done in the  Main lab at [place] on 1st floor. If you have a lab appointment with the Cancer Center please come in thru the  Main Entrance and check in at the main information desk  You need to re-schedule your appointment should you arrive 10 or more minutes late.  We strive to give you quality time with our providers, and arriving late affects you and other patients whose appointments are after yours.  Also, if you no show three or more times for appointments you may be dismissed from the clinic at the providers discretion.     Again, thank you for choosing [facility].  Our hope is that these requests will decrease the amount of time that you wait before being seen by our physicians.       _____________________________________________________________  Should you have questions after your visit to [facility], please contact our office at [phone] between the hours of 8:30 a.m. and 4:30 p.m.  Voicemails left after 4:30 p.m. will not be returned until the following business day.  For prescription refill requests, have your pharmacy contact our office.         Resources For Cancer Patients and their Caregivers ? American Cancer Society: Can assist with transportation, wigs, general needs, runs Look Good Feel Better.        1-888-227-6333 ? Cancer Care: Provides financial assistance, online support groups, medication/co-pay assistance.  1-800-813-HOPE  (4673) ? [facility] Assists [place] cancer patients and their families through emotional , educational and financial support.  336-427-4357 ? [place] DSS Where to apply for food stamps, Medicaid and utility assistance. 336-342-1394 ? RCATS: Transportation to medical appointments. 336-347-2287 ? Social Security Administration: May apply for disability if have a Stage IV cancer. 336-342-7796 1-800-772-1213 ? [place] Aging, Disability and Transit Services: Assists with nutrition, care and transit needs. 336-349-2343  Cancer Center Support Programs: @10RELATIVEDAYS@ > Cancer Support Group  2nd Tuesday of the month 1pm-2pm, Journey Room  > Creative Journey  3rd Tuesday of the month 1130am-1pm, Journey Room  > Look Good Feel Better  1st Wednesday of the month 10am-12 noon, Journey Room (Call American Cancer Society to register 1-800-395-5775)    

## 2015-12-11 NOTE — Progress Notes (Signed)
Nicole [name] presents today for injection per MD orders. B12 1000mcg administered IM in right Upper Arm. Administration without incident. Patient tolerated well.  

## 2016-01-09 ENCOUNTER — Encounter (HOSPITAL_COMMUNITY): Payer: PPO | Attending: Hematology & Oncology

## 2016-01-09 ENCOUNTER — Encounter (HOSPITAL_COMMUNITY): Payer: Self-pay

## 2016-01-09 VITALS — BP 143/89 | HR 80 | Temp 98.2°F | Resp 16

## 2016-01-09 DIAGNOSIS — E538 Deficiency of other specified B group vitamins: Secondary | ICD-10-CM

## 2016-01-09 DIAGNOSIS — C8590 Non-Hodgkin lymphoma, unspecified, unspecified site: Secondary | ICD-10-CM | POA: Insufficient documentation

## 2016-01-09 DIAGNOSIS — Z23 Encounter for immunization: Secondary | ICD-10-CM | POA: Diagnosis not present

## 2016-01-09 DIAGNOSIS — R197 Diarrhea, unspecified: Secondary | ICD-10-CM | POA: Insufficient documentation

## 2016-01-09 DIAGNOSIS — Z85038 Personal history of other malignant neoplasm of large intestine: Secondary | ICD-10-CM | POA: Insufficient documentation

## 2016-01-09 DIAGNOSIS — C649 Malignant neoplasm of unspecified kidney, except renal pelvis: Secondary | ICD-10-CM | POA: Insufficient documentation

## 2016-01-09 MED ORDER — INFLUENZA VAC SPLIT QUAD 0.5 ML IM SUSY
PREFILLED_SYRINGE | INTRAMUSCULAR | Status: AC
  Filled 2016-01-09: qty 0.5

## 2016-01-09 MED ORDER — CYANOCOBALAMIN 1000 MCG/ML IJ SOLN
1000.0000 ug | Freq: Once | INTRAMUSCULAR | Status: AC
  Administered 2016-01-09: 1000 ug via INTRAMUSCULAR
  Filled 2016-01-09: qty 1

## 2016-01-09 MED ORDER — INFLUENZA VAC SPLIT QUAD 0.5 ML IM SUSY
0.5000 mL | PREFILLED_SYRINGE | Freq: Once | INTRAMUSCULAR | Status: AC
  Administered 2016-01-09: 0.5 mL via INTRAMUSCULAR

## 2016-01-09 NOTE — Progress Notes (Signed)
Pt here today for B12 injection, pt also wanted flu shot. Pt given B12 in right deltoid, Flu shot given in left deltoid. Pt tolerated well. Pt stable and discharged home ambulatory.

## 2016-01-09 NOTE — Patient Instructions (Signed)
[place] at [facility] Discharge Instructions  RECOMMENDATIONS MADE BY THE CONSULTANT AND ANY TEST RESULTS WILL BE SENT TO YOUR REFERRING PHYSICIAN.  You were given a B12 injection and flu shot.  Return as scheduled.    Thank you for choosing [place] at [facility] to provide your oncology and hematology care.  To afford each patient quality time with our provider, please arrive at least 15 minutes before your scheduled appointment time.   Beginning January 23rd 2017 lab work for the [facility] will be done in the  Main lab at [facility] on 1st floor. If you have a lab appointment with the [facility] please come in thru the  Main Entrance and check in at the main information desk  You need to re-schedule your appointment should you arrive 10 or more minutes late.  We strive to give you quality time with our providers, and arriving late affects you and other patients whose appointments are after yours.  Also, if you no show three or more times for appointments you may be dismissed from the clinic at the providers discretion.     Again, thank you for choosing [facility].  Our hope is that these requests will decrease the amount of time that you wait before being seen by our physicians.       _____________________________________________________________  Should you have questions after your visit to [facility], please contact our office at [phone] between the hours of 8:30 a.m. and 4:30 p.m.  Voicemails left after 4:30 p.m. will not be returned until the following business day.  For prescription refill requests, have your pharmacy contact our office.         Resources For Cancer Patients and their Caregivers ? American Cancer Society: Can assist with transportation, wigs, general needs, runs Look Good Feel Better.        ([phone] ? Cancer Care: Provides financial assistance, online support  groups, medication/co-pay assistance.  1-800-813-HOPE 619-806-0523) ? [facility] Assists [place] cancer patients and their families through emotional , educational and financial support.  (828)258-4575 ? [place] DSS Where to apply for food stamps, Medicaid and utility assistance. (907)133-2624 ? RCATS: Transportation to medical appointments. 530-536-3030 ? Social Security Administration: May apply for disability if have a Stage IV cancer. 7063178173 207-869-9058 ? [employer], Disability and Transit Services: Assists with nutrition, care and transit needs. [facility] Support Programs: @10RELATIVEDAYS @ > Cancer Support Group  2nd Tuesday of the month 1pm-2pm, Journey Room  > Creative Journey  3rd Tuesday of the month 1130am-1pm, Journey Room  > Look Good Feel Better  1st Wednesday of the month 10am-12 noon, Journey Room (Call [place] to register [phone])

## 2016-01-10 ENCOUNTER — Ambulatory Visit (HOSPITAL_COMMUNITY): Payer: PPO

## 2016-02-12 ENCOUNTER — Encounter (HOSPITAL_COMMUNITY): Payer: PPO | Attending: Oncology

## 2016-02-12 ENCOUNTER — Encounter (HOSPITAL_COMMUNITY): Payer: Self-pay

## 2016-02-12 VITALS — BP 139/82 | HR 77 | Resp 16

## 2016-02-12 DIAGNOSIS — E538 Deficiency of other specified B group vitamins: Secondary | ICD-10-CM

## 2016-02-12 MED ORDER — CYANOCOBALAMIN 1000 MCG/ML IJ SOLN
1000.0000 ug | Freq: Once | INTRAMUSCULAR | Status: AC
  Administered 2016-02-12: 1000 ug via INTRAMUSCULAR

## 2016-02-12 MED ORDER — CYANOCOBALAMIN 1000 MCG/ML IJ SOLN
INTRAMUSCULAR | Status: AC
  Filled 2016-02-12: qty 1

## 2016-02-12 NOTE — Progress Notes (Signed)
Nicole [name] presents today for injection per MD orders. B12 1,06mcg administered IM in left Upper Arm. Administration without incident. Patient tolerated well. Vitals stable and discharged home from clinic ambulatory.

## 2016-02-12 NOTE — Patient Instructions (Signed)
[facility] at [facility] Discharge Instructions  RECOMMENDATIONS MADE BY THE CONSULTANT AND ANY TEST RESULTS WILL BE SENT TO YOUR REFERRING PHYSICIAN.  B12 injection given today. Follow up as scheduled.  Thank you for choosing [facility] at [facility] to provide your oncology and hematology care.  To afford each patient quality time with our provider, please arrive at least 15 minutes before your scheduled appointment time.   Beginning January 23rd 2017 lab work for the Cancer Center will be done in the  Main lab at [place] on 1st floor. If you have a lab appointment with the Cancer Center please come in thru the  Main Entrance and check in at the main information desk  You need to re-schedule your appointment should you arrive 10 or more minutes late.  We strive to give you quality time with our providers, and arriving late affects you and other patients whose appointments are after yours.  Also, if you no show three or more times for appointments you may be dismissed from the clinic at the providers discretion.     Again, thank you for choosing [facility].  Our hope is that these requests will decrease the amount of time that you wait before being seen by our physicians.       _____________________________________________________________  Should you have questions after your visit to  Cancer Center, please contact our office at [phone] between the hours of 8:30 a.m. and 4:30 p.m.  Voicemails left after 4:30 p.m. will not be returned until the following business day.  For prescription refill requests, have your pharmacy contact our office.         Resources For Cancer Patients and their Caregivers ? American Cancer Society: Can assist with transportation, wigs, general needs, runs Look Good Feel Better.        1-888-227-6333 ? Cancer Care: Provides financial assistance, online support groups,  medication/co-pay assistance.  1-800-813-HOPE (4673) ? [facility] Assists [place] cancer patients and their families through emotional , educational and financial support.  336-427-4357 ? [place] DSS Where to apply for food stamps, Medicaid and utility assistance. 336-342-1394 ? RCATS: Transportation to medical appointments. 336-347-2287 ? Social Security Administration: May apply for disability if have a Stage IV cancer. 336-342-7796 1-800-772-1213 ? [place] Aging, Disability and Transit Services: Assists with nutrition, care and transit needs. 336-349-2343  Cancer Center Support Programs: @10RELATIVEDAYS@ > Cancer Support Group  2nd Tuesday of the month 1pm-2pm, Journey Room  > Creative Journey  3rd Tuesday of the month 1130am-1pm, Journey Room  > Look Good Feel Better  1st Wednesday of the month 10am-12 noon, Journey Room (Call American Cancer Society to register [phone])   

## 2016-02-26 ENCOUNTER — Other Ambulatory Visit: Payer: Self-pay | Admitting: Urology

## 2016-02-26 ENCOUNTER — Other Ambulatory Visit (HOSPITAL_COMMUNITY): Payer: Self-pay | Admitting: Family Medicine

## 2016-02-26 ENCOUNTER — Ambulatory Visit (HOSPITAL_COMMUNITY)
Admission: RE | Admit: 2016-02-26 | Discharge: 2016-02-26 | Disposition: A | Discharge status: Home / Self Care | Payer: PPO | Source: Ambulatory Visit | Attending: Urology | Admitting: Urology

## 2016-02-26 DIAGNOSIS — N200 Calculus of kidney: Secondary | ICD-10-CM

## 2016-02-26 DIAGNOSIS — Z1231 Encounter for screening mammogram for malignant neoplasm of breast: Secondary | ICD-10-CM

## 2016-02-26 DIAGNOSIS — Z87442 Personal history of urinary calculi: Secondary | ICD-10-CM | POA: Diagnosis not present

## 2016-02-27 ENCOUNTER — Ambulatory Visit (INDEPENDENT_AMBULATORY_CARE_PROVIDER_SITE_OTHER): Payer: PPO | Admitting: Urology

## 2016-02-27 DIAGNOSIS — N200 Calculus of kidney: Secondary | ICD-10-CM | POA: Diagnosis not present

## 2016-03-13 ENCOUNTER — Encounter (HOSPITAL_COMMUNITY): Payer: Self-pay

## 2016-03-13 ENCOUNTER — Encounter (HOSPITAL_COMMUNITY): Payer: PPO | Attending: Hematology & Oncology

## 2016-03-13 VITALS — BP 145/81 | HR 91 | Temp 98.4°F | Resp 18

## 2016-03-13 DIAGNOSIS — R197 Diarrhea, unspecified: Secondary | ICD-10-CM | POA: Insufficient documentation

## 2016-03-13 DIAGNOSIS — C649 Malignant neoplasm of unspecified kidney, except renal pelvis: Secondary | ICD-10-CM | POA: Insufficient documentation

## 2016-03-13 DIAGNOSIS — C8590 Non-Hodgkin lymphoma, unspecified, unspecified site: Secondary | ICD-10-CM | POA: Insufficient documentation

## 2016-03-13 DIAGNOSIS — Z85038 Personal history of other malignant neoplasm of large intestine: Secondary | ICD-10-CM | POA: Insufficient documentation

## 2016-03-13 DIAGNOSIS — E538 Deficiency of other specified B group vitamins: Secondary | ICD-10-CM | POA: Insufficient documentation

## 2016-03-13 MED ORDER — CYANOCOBALAMIN 1000 MCG/ML IJ SOLN
1000.0000 ug | Freq: Once | INTRAMUSCULAR | Status: AC
  Administered 2016-03-13: 1000 ug via INTRAMUSCULAR

## 2016-03-13 NOTE — Patient Instructions (Signed)
[facility] at [facility] Discharge Instructions  RECOMMENDATIONS MADE BY THE CONSULTANT AND ANY TEST RESULTS WILL BE SENT TO YOUR REFERRING PHYSICIAN.  B12 injection today. Return as scheduled for injections. Return as scheduled for lab work and office visit.  Thank you for choosing [facility] at [facility] to provide your oncology and hematology care.  To afford each patient quality time with our provider, please arrive at least 15 minutes before your scheduled appointment time.   Beginning January 23rd 2017 lab work for the Cancer Center will be done in the  Main lab at [place] on 1st floor. If you have a lab appointment with the Cancer Center please come in thru the  Main Entrance and check in at the main information desk  You need to re-schedule your appointment should you arrive 10 or more minutes late.  We strive to give you quality time with our providers, and arriving late affects you and other patients whose appointments are after yours.  Also, if you no show three or more times for appointments you may be dismissed from the clinic at the providers discretion.     Again, thank you for choosing [facility].  Our hope is that these requests will decrease the amount of time that you wait before being seen by our physicians.       _____________________________________________________________  Should you have questions after your visit to [facility], please contact our office at [phone] between the hours of 8:30 a.m. and 4:30 p.m.  Voicemails left after 4:30 p.m. will not be returned until the following business day.  For prescription refill requests, have your pharmacy contact our office.         Resources For Cancer Patients and their Caregivers ? American Cancer Society: Can assist with transportation, wigs, general needs, runs Look Good Feel Better.        1-888-227-6333 ? Cancer  Care: Provides financial assistance, online support groups, medication/co-pay assistance.  1-800-813-HOPE (4673) ? [facility] Assists [place] cancer patients and their families through emotional , educational and financial support.  336-427-4357 ? [place] DSS Where to apply for food stamps, Medicaid and utility assistance. 336-342-1394 ? RCATS: Transportation to medical appointments. 336-347-2287 ? Social Security Administration: May apply for disability if have a Stage IV cancer. 336-342-7796 1-800-772-1213 ? [place] Aging, Disability and Transit Services: Assists with nutrition, care and transit needs. 336-349-2343  Cancer Center Support Programs: @10RELATIVEDAYS@ > Cancer Support Group  2nd Tuesday of the month 1pm-2pm, Journey Room  > Creative Journey  3rd Tuesday of the month 1130am-1pm, Journey Room  > Look Good Feel Better  1st Wednesday of the month 10am-12 noon, Journey Room (Call American Cancer Society to register [phone])   

## 2016-03-13 NOTE — Progress Notes (Signed)
[place] B Nicole [name] presents today for injection per the provider's orders.  B12 administration without incident; see MAR for injection details.  Patient tolerated procedure well and without incident.  No questions or complaints noted at this time. 

## 2016-03-21 ENCOUNTER — Ambulatory Visit (HOSPITAL_COMMUNITY)
Admission: RE | Admit: 2016-03-21 | Discharge: 2016-03-21 | Disposition: A | Discharge status: Home / Self Care | Payer: PPO | Source: Ambulatory Visit | Attending: Hematology & Oncology | Admitting: Hematology & Oncology

## 2016-03-21 DIAGNOSIS — Z905 Acquired absence of kidney: Secondary | ICD-10-CM | POA: Insufficient documentation

## 2016-03-21 DIAGNOSIS — K8020 Calculus of gallbladder without cholecystitis without obstruction: Secondary | ICD-10-CM | POA: Insufficient documentation

## 2016-03-21 DIAGNOSIS — N200 Calculus of kidney: Secondary | ICD-10-CM | POA: Insufficient documentation

## 2016-03-21 DIAGNOSIS — I700 Atherosclerosis of aorta: Secondary | ICD-10-CM | POA: Insufficient documentation

## 2016-03-21 DIAGNOSIS — Z85038 Personal history of other malignant neoplasm of large intestine: Secondary | ICD-10-CM | POA: Diagnosis not present

## 2016-03-21 DIAGNOSIS — R591 Generalized enlarged lymph nodes: Secondary | ICD-10-CM | POA: Insufficient documentation

## 2016-03-21 DIAGNOSIS — Z1509 Genetic susceptibility to other malignant neoplasm: Secondary | ICD-10-CM | POA: Diagnosis not present

## 2016-03-21 LAB — POCT I-STAT CREATININE: CREATININE: 0.8 mg/dL (ref 0.44–1.00)

## 2016-03-21 MED ORDER — IOPAMIDOL (ISOVUE-300) INJECTION 61%
100.0000 mL | Freq: Once | INTRAVENOUS | Status: AC | PRN
  Administered 2016-03-21: 100 mL via INTRAVENOUS

## 2016-03-26 ENCOUNTER — Encounter (HOSPITAL_COMMUNITY): Payer: PPO | Attending: Hematology & Oncology | Admitting: Hematology & Oncology

## 2016-03-26 ENCOUNTER — Encounter (HOSPITAL_COMMUNITY): Payer: Self-pay | Admitting: Hematology & Oncology

## 2016-03-26 VITALS — BP 164/83 | HR 84 | Temp 98.1°F | Resp 20 | Wt 196.4 lb

## 2016-03-26 DIAGNOSIS — Z85038 Personal history of other malignant neoplasm of large intestine: Secondary | ICD-10-CM | POA: Insufficient documentation

## 2016-03-26 DIAGNOSIS — C649 Malignant neoplasm of unspecified kidney, except renal pelvis: Secondary | ICD-10-CM | POA: Insufficient documentation

## 2016-03-26 DIAGNOSIS — C642 Malignant neoplasm of left kidney, except renal pelvis: Secondary | ICD-10-CM

## 2016-03-26 DIAGNOSIS — Z1509 Genetic susceptibility to other malignant neoplasm: Secondary | ICD-10-CM

## 2016-03-26 DIAGNOSIS — C8200 Follicular lymphoma grade I, unspecified site: Secondary | ICD-10-CM

## 2016-03-26 DIAGNOSIS — R197 Diarrhea, unspecified: Secondary | ICD-10-CM | POA: Insufficient documentation

## 2016-03-26 DIAGNOSIS — C8590 Non-Hodgkin lymphoma, unspecified, unspecified site: Secondary | ICD-10-CM | POA: Insufficient documentation

## 2016-03-26 DIAGNOSIS — Z8572 Personal history of non-Hodgkin lymphomas: Secondary | ICD-10-CM

## 2016-03-26 DIAGNOSIS — E538 Deficiency of other specified B group vitamins: Secondary | ICD-10-CM | POA: Insufficient documentation

## 2016-03-26 DIAGNOSIS — Z8553 Personal history of malignant neoplasm of renal pelvis: Secondary | ICD-10-CM

## 2016-03-26 NOTE — Patient Instructions (Signed)
[place] at [facility] Discharge Instructions  RECOMMENDATIONS MADE BY THE CONSULTANT AND ANY TEST RESULTS WILL BE SENT TO YOUR REFERRING PHYSICIAN.  You were seen today by Dr. Whitney Muse Follow up in 1 year with her Have CT scan prior to that visit  Thank you for choosing [place] at [facility] to provide your oncology and hematology care.  To afford each patient quality time with our provider, please arrive at least 15 minutes before your scheduled appointment time.    If you have a lab appointment with the [facility] please come in thru the  Main Entrance and check in at the main information desk  You need to re-schedule your appointment should you arrive 10 or more minutes late.  We strive to give you quality time with our providers, and arriving late affects you and other patients whose appointments are after yours.  Also, if you no show three or more times for appointments you may be dismissed from the clinic at the providers discretion.     Again, thank you for choosing [facility].  Our hope is that these requests will decrease the amount of time that you wait before being seen by our physicians.       _____________________________________________________________  Should you have questions after your visit to [facility], please contact our office at [phone] between the hours of 8:30 a.m. and 4:30 p.m.  Voicemails left after 4:30 p.m. will not be returned until the following business day.  For prescription refill requests, have your pharmacy contact our office.       Resources For Cancer Patients and their Caregivers ? American Cancer Society: Can assist with transportation, wigs, general needs, runs Look Good Feel Better.        772-192-6674 ? Cancer Care: Provides financial assistance, online support groups, medication/co-pay assistance.  1-800-813-HOPE 682-275-7435) ? [place] Assists [place] cancer patients and their families through emotional , educational and financial support.  (830)113-2325 ? [place] DSS Where to apply for food stamps, Medicaid and utility assistance. 539-240-2597 ? RCATS: Transportation to medical appointments. 828-106-4602 ? Social Security Administration: May apply for disability if have a Stage IV cancer. 773-226-7300 732-301-5517 ? [employer], Disability and Transit Services: Assists with nutrition, care and transit needs. [place] Support Programs: @10RELATIVEDAYS @ > Cancer Support Group  2nd Tuesday of the month 1pm-2pm, Journey Room  > Creative Journey  3rd Tuesday of the month 1130am-1pm, Journey Room  > Look Good Feel Better  1st Wednesday of the month 10am-12 noon, Journey Room (Call [place] to register [phone])

## 2016-03-26 NOTE — Progress Notes (Signed)
[name], MD [address] [place] [address]  Renal cell carcinoma of left kidney [facility]) - Plan: Cholecalciferol 5000 units capsule, CT Abdomen Pelvis W Contrast  Follicular lymphoma grade I, unspecified body region [facility]) - Plan: Cholecalciferol 5000 units capsule, CT Abdomen Pelvis W Contrast  Lynch syndrome - Plan: Cholecalciferol 5000 units capsule, CT Abdomen Pelvis W Contrast  CURRENT THERAPY: Surveillance  INTERVAL HISTORY: Nicole [name] 66 y.o. 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 S/P hysterectomy with bilateral salpingo-oophorectomy in 1993 by Dr. Heide Spark. AND H/O Follicular-type NHL, well-differentiated with CD20 positivity, grade 1 found at the time of sigmoid colon resection in 2007 with 2 involved lymph nodes. AND Colon cancer x 3. S/P subtotal colectomy with ileorectal anastomosis by Dr. Lenise Arena at [facility] on 12/11/2005. She will undergo flexible sigmoidoscopy annually by Dr. Laural Golden and we will follow CEAs AND Renal cancer, S/P left partial nephrectomy by Dr. Beatrix Fetters. AND Deceased son at young age from pancreatic cancer. (?Lynch Syndrome)  Chart reviewed.   Nicole [name] returns to the [place] alone today.  She is currently taking vitamin b12 monthtly   States she is doing great. Energy is good.  She has a mammogram scheduled for 05/16/15. She says she sometimes does self breast exams.  She reports right shoulder pain.   She sees Dr. Laural Golden in 2-3 months. Denies blood in stool.  She notes her bowel movents have been better, she doesn't have to take lomotil as much.   She has not seen her PCP recently  because she was supposed to have lost weight for her next visit and she hasn't. She admits she is supposed to see them every 3 months because of the cholesterol medication she is on.   States appetite is good.   She takes Prednizone for her hips and back.  She has infrequent abnormal hearth rhythm that she attributes to anxiety.   She has already received  her flu shot.  Patient has no other questions or concerns at this time.  We reviewed scans with the patient. She admitted she gets kidney stones.  Past Medical History:  Diagnosis Date  . Arthritis    shorulder  . B12 deficiency   . Chronic diarrhea SECONDARY HX COLON CANCER  . Diarrhea   . History of colon cancer [facility] SYNDROME ---  COLORECTAL CANCER S/P COLECTOMY X3  LAST ONE 2007   NO RECURRENCE  . History of colon cancer 05/04/2013  . History of kidney infection 08/2013  . History of kidney stones   . History of MRSA infection 08/2013   left leg  . Hx of osteopenia   . Kidney stone   . Lynch syndrome 10/17/2010   Hereditary colon cancer  . NHL (non-Hodgkin's lymphoma) ([place]) 10/17/2010   MONITORED BY DR Tressie Stalker  . Personal history of renal cancer S/P LEFT PARTIAL NEPHRECTOMY 2007--  NO RECURRENCE   DUE TO KIDNEY CANCER  . Right shoulder pain S/P ROTATOR CUFF REPAIR    has Lynch syndrome; Renal cell carcinoma ([place]); NHL (non-Hodgkin's lymphoma) ([place]); Obesity; B12 deficiency; Rotator cuff tear, right; Pain in joint, shoulder region;  Muscle weakness (generalized); History of colon cancer; Renal calculi; and Renal stones on her problem list.     is allergic to latex; morphine and related; and tape.  Nicole [name] had no medications administered during this visit.  Past Surgical History:  Procedure Laterality Date  . APPENDECTOMY  1993   W/ LAVH  . COLONOSCOPY W/ POLYPECTOMY    . CYSTOSCOPY W/ RETROGRADES  07/14/2014   Procedure: CYSTOSCOPY WITH RETROGRADE PYELOGRAM;  Surgeon: Franchot Gallo, MD;  Location[facility];  Service: Urology;;  . Consuela Mimes W/ URETERAL STENT REMOVAL Left 07/14/2014   Procedure: CYSTOSCOPY WITH STENT REMOVAL;  Surgeon: Franchot Gallo, MD;  Location[facility];  Service: Urology;  Laterality: Left;  . CYSTOSCOPY WITH STENT PLACEMENT  02/07/2012   Procedure: CYSTOSCOPY WITH STENT PLACEMENT;  Surgeon: Franchot Gallo, MD;  Location: [facility];  Service: Urology;  Laterality: Left;  . CYSTOSCOPY WITH STENT PLACEMENT Left 07/14/2014   Procedure: CYSTOSCOPY WITH STENT PLACEMENT;  Surgeon: Franchot Gallo, MD;  Location: [facility];  Service: Urology;  Laterality: Left;  . FLEXIBLE SIGMOIDOSCOPY  08/29/2011   Procedure: FLEXIBLE SIGMOIDOSCOPY;  Surgeon: Rogene Houston, MD;  [facility];  Service: Endoscopy;  Laterality: N/A;  930  . FLEXIBLE SIGMOIDOSCOPY N/A 09/28/2012   Procedure: FLEXIBLE SIGMOIDOSCOPY;  Surgeon: Rogene Houston, MD;  [facility];  Service: Endoscopy;  Laterality: N/A;  730  . FLEXIBLE SIGMOIDOSCOPY N/A 07/08/2014   Procedure: FLEXIBLE SIGMOIDOSCOPY;  Surgeon: Rogene Houston, MD;  [facility];  Service: Endoscopy;  Laterality: N/A;  830 - moved to 10:20 - Ann to notify pt  . FLEXIBLE SIGMOIDOSCOPY N/A 08/11/2015   Procedure: FLEXIBLE SIGMOIDOSCOPY;  Surgeon: Rogene Houston, MD;  [facility];  Service: Endoscopy;  Laterality: N/A;  730  . HEMICOLECTOMY  2001   right  . HEMICOLECTOMY  2002   left  . HOLMIUM LASER APPLICATION Left 07/10/9561   Procedure: HOLMIUM LASER LITHOTRIPSY,;  Surgeon: Franchot Gallo, MD;  Location: [facility];  Service: Urology;  Laterality: Left;  . kidney resection  2007   left partial  . LAPAROSCOPIC ASSISTED VAGINAL HYSTERECTOMY  1993   W/ BILATERAL SALPINGO-OOPHORECTOMY  . LEFT FLANK EXPLORATION W/  PARTIAL LEFT NEPHRECTOMY/ LEFT HILAR LYMPH NODE BX  04-17-2005  DR DAHLSTEDT   RENAL CELL CARCINOMA  .  LITHOTRIPSY  11-22013   x2  . NEPHROLITHOTOMY Left 06/09/2014   Procedure: NEPHROLITHOTOMY PERCUTANEOUS;  Surgeon: Franchot Gallo, MD;  Location: [facility];  Service: Urology;  Laterality: Left;  with STENT  . RIGHT ROTATOR  CUFF REPAIR/   BICEP REPAIR  10-17-2011  DR SUPPLE  . SUBTOTAL COLECTOMY  2007  . URETEROSCOPY WITH HOLMIUM LASER LITHOTRIPSY Left 07/14/2014   Procedure: URETEROSCOPY  EXTRACTION OF STONES;  Surgeon: Franchot Gallo, MD;  Location: [facility];  Service: Urology;  Laterality: Left;    Review of Systems  Constitutional: Negative.        Appetite is good.  HENT: Negative.   Eyes: Negative.   Respiratory: Negative.   Cardiovascular: Negative.        Infrequent irregular heart beat.  Gastrointestinal: Negative.  Negative for blood in stool.       Bowel movements have been better than before.  Genitourinary: Negative.   Musculoskeletal: Negative.        Right Shoulder pain  Skin: Negative.   Neurological: Negative.   Endo/Heme/Allergies: Negative.  Psychiatric/Behavioral: Negative.   All other systems reviewed and are negative. 14 point review of systems was performed and is negative except as detailed under history of present illness and above     PHYSICAL EXAMINATION  ECOG PERFORMANCE STATUS: 0 - Asymptomatic  BP (!) 164/83 (BP Location: Left Arm, Patient Position: Sitting)   Pulse 84   Temp 98.1 F (36.7 C) (Oral)   Resp 20   Wt 196 lb 6.4 oz (89.1 kg)   SpO2 100%   BMI 37.11 kg/m    Physical Exam  Constitutional: She is oriented to person, place, and time and well-developed, well-nourished, and in no distress.  HENT:  Head: Normocephalic and atraumatic.  Nose: Nose normal.  Mouth/Throat: Oropharynx is clear and moist. No oropharyngeal exudate.  Eyes: Conjunctivae and EOM are normal. Pupils are equal, round, and reactive to light. Right eye exhibits no discharge. Left eye exhibits no discharge. No scleral icterus.  Neck:  Normal range of motion. Neck supple. No tracheal deviation present. No thyromegaly present.  Cardiovascular: Normal rate, regular rhythm and normal heart sounds.  Exam reveals no gallop and no friction rub.   No murmur heard. Pulmonary/Chest: Effort normal and breath sounds normal. She has no wheezes. She has no rales.  Abdominal: Soft. Bowel sounds are normal. She exhibits no distension and no mass. There is no tenderness. There is no rebound and no guarding.  Musculoskeletal: Normal range of motion. She exhibits no edema.  Lymphadenopathy:    She has no cervical adenopathy.  Neurological: She is alert and oriented to person, place, and time. She has normal reflexes. No cranial nerve deficit. Gait normal. Coordination normal.  Skin: Skin is warm and dry. No rash noted.  Psychiatric: Mood, memory, affect and judgment normal.  Nursing note and vitals reviewed.   LABORATORY DATA: I have reviewed the data as listed  CBC    Component Value Date/Time   WBC 7.8 03/24/2015 1112   RBC 4.49 03/24/2015 1112   HGB 13.6 03/24/2015 1112   HCT 40.7 03/24/2015 1112   PLT 185 03/24/2015 1112   MCV 90.6 03/24/2015 1112   MCH 30.3 03/24/2015 1112   MCHC 33.4 03/24/2015 1112   RDW 12.7 03/24/2015 1112   LYMPHSABS 1.7 03/24/2015 1112   MONOABS 1.0 03/24/2015 1112   EOSABS 0.3 03/24/2015 1112   BASOSABS 0.1 03/24/2015 1112      Chemistry      Component Value Date/Time   NA 141 03/24/2015 1112   K 3.7 03/24/2015 1112   CL 104 03/24/2015 1112   CO2 27 03/24/2015 1112   BUN 23 (H) 03/24/2015 1112   CREATININE 0.80 03/21/2016 1103      Component Value Date/Time   CALCIUM 9.6 03/24/2015 1112   ALKPHOS 73 03/24/2015 1112   AST 19 03/24/2015 1112   ALT 17 03/24/2015 1112   BILITOT 0.5 03/24/2015 1112       RADIOGRAPHIC STUDIES: I have personally reviewed the radiological images as listed and agreed with the findings in the report. CLINICAL DATA:  65 year old female with history of  lymphoma, kidney cancer status post partial nephrectomy in 2007, and colon cancer status post surgical resection in 2001, 2002 in 2007. Followup study.  EXAM: CT ABDOMEN AND PELVIS WITH CONTRAST  TECHNIQUE: Multidetector CT imaging of the abdomen and pelvis was performed using the standard protocol following bolus administration of intravenous contrast.  CONTRAST:  118m ISOVUE-300 IOPAMIDOL (ISOVUE-300) INJECTION 61%  COMPARISON:  CT the abdomen and pelvis 03/22/2015.  FINDINGS: Lower chest: Unremarkable.  Hepatobiliary: No cystic or solid hepatic lesions. No intra or extrahepatic biliary ductal dilatation. Small calcified gallstone lying dependently in the gallbladder. No findings to suggest an acute cholecystitis at this time.  Pancreas: No pancreatic mass. No pancreatic ductal dilatation. No pancreatic or peripancreatic fluid or inflammatory changes.  Spleen: Unremarkable.  Adrenals/Urinary Tract: Postoperative changes of partial nephrectomy are again noted in the lateral aspect of the interpolar region of the left kidney. Several nonobstructive calculi are present within the collecting systems of the kidneys bilaterally measuring up to 7 mm in the interpolar collecting system of the left kidney. No suspicious renal lesions are noted. There is no hydroureteronephrosis. Urinary bladder is normal in appearance. Bilateral adrenal glands are normal in appearance.  Stomach/Bowel: The appearance of the stomach is normal. There is no pathologic dilatation of small bowel or remaining portions of the colon. Postoperative changes of subtotal colectomy are noted.  Vascular/Lymphatic: Aortic atherosclerosis (mild), without evidence of aneurysm or dissection in the abdominal or pelvic vasculature. Numerous borderline enlarged and enlarged retroperitoneal lymph nodes appear similar to the prior examination, with the largest right para-aortic lymph node measuring up to  19 mm in short axis (image 31 of series 2). No pelvic lymphadenopathy.  Reproductive: Status post hysterectomy. Ovaries are not confidently identified may be surgically absent or atrophic.  Other: No significant volume of ascites.  No pneumoperitoneum.  Musculoskeletal: There are no aggressive appearing lytic or blastic lesions noted in the visualized portions of the skeleton.  IMPRESSION: 1. Retroperitoneal lymphadenopathy appears very similar to prior study 03/22/2015, as above. 2. Status post partial left nephrectomy and subtotal colectomy. No definite findings to suggest new metastatic disease in the abdomen or pelvis. 3. Nonobstructive calculi in the collecting systems of the kidneys bilaterally (left greater than right) measuring up to 7 mm in the interpolar collecting system of the left kidney. No ureteral stones or findings of urinary tract obstruction are noted at this time. 4. Cholelithiasis without evidence of acute cholecystitis. 5. Aortic atherosclerosis (mild). 6. Additional incidental findings, as above.   Electronically Signed   By: Vinnie Langton M.D.   On: 03/21/2016 13:46    ASSESSMENT AND PLAN:  Lynch Syndrome with genetic testing at [facility] on November 21, 2005 demonstrating MLH1 mutation [id number] conferring an 82% risk of colorectal cancer, 60% risk of endometrial cancer (by age 42), 13% risk of gastric cancer, 12 % risk of ovarian cancer (by age 20), and first degree relatives have a 1 in 2 chance of having this mutation. S/P hysterectomy with bilateral salpingo-oophorectomy in 1993 by Dr. Heide Spark. AND H/O Follicular-type NHL, well-differentiated with CD20 positivity, grade 1 found at the time of sigmoid colon resection in 2007 with 2 involved lymph nodes. AND Colon cancer x 3. S/P subtotal colectomy with ileorectal anastomosis by Dr. Lenise Arena at [facility] on 12/11/2005. She will undergo flexible sigmoidoscopy annually by Dr. Laural Golden and we will follow  CEAs AND Renal cancer, S/P left partial nephrectomy by Dr. Beatrix Fetters.  She is currently doing well. We reviewed her CT scans in detail. They are stable.  Results are noted above  Will continue surveillance given her Lynch Syndrome with labs every 6 months and annual CT abd/pelvis imaging.  She would like refills on lomotil. She is due for another mammogram in February.   Orders Placed This Encounter  Procedures  . CT Abdomen Pelvis W Contrast    Standing Status:   Future    Standing Expiration Date:  03/26/2017    Order Specific Question:   If indicated for the ordered procedure, I authorize the administration of contrast media per Radiology protocol    Answer:   Yes    Order Specific Question:   Reason for Exam (SYMPTOM  OR DIAGNOSIS REQUIRED)    Answer:   lynch syndrome    Order Specific Question:   Preferred imaging location?    Answer:   [facility]   All questions were answered. The patient knows to call the clinic with any problems, questions or concerns. We can certainly see the patient much sooner if necessary.  This document serves as a record of services personally performed by Ancil Linsey, MD. It was created on her behalf by Shirlean Mylar, a trained medical scribe. The creation of this record is based on the scribe's personal observations and the provider's statements to them. This document has been checked and approved by the attending provider.   I have reviewed the above documentation for accuracy and completeness, and I agree with the above.  Molli Hazard, MD  03/26/2016

## 2016-04-15 ENCOUNTER — Encounter (HOSPITAL_BASED_OUTPATIENT_CLINIC_OR_DEPARTMENT_OTHER): Payer: PPO

## 2016-04-15 VITALS — BP 152/77 | HR 77 | Temp 97.7°F | Resp 20

## 2016-04-15 DIAGNOSIS — E538 Deficiency of other specified B group vitamins: Secondary | ICD-10-CM | POA: Diagnosis not present

## 2016-04-15 MED ORDER — CYANOCOBALAMIN 1000 MCG/ML IJ SOLN
1000.0000 ug | Freq: Once | INTRAMUSCULAR | Status: AC
  Administered 2016-04-15: 1000 ug via INTRAMUSCULAR

## 2016-04-15 MED ORDER — CYANOCOBALAMIN 1000 MCG/ML IJ SOLN
INTRAMUSCULAR | Status: AC
  Filled 2016-04-15: qty 1

## 2016-04-15 NOTE — Patient Instructions (Signed)
[facility] at [facility] Discharge Instructions  RECOMMENDATIONS MADE BY THE CONSULTANT AND ANY TEST RESULTS WILL BE SENT TO YOUR REFERRING PHYSICIAN.  Vitamin B12 1000 mcg injection given today as ordered. Return as scheduled.  Thank you for choosing [facility] at [facility] to provide your oncology and hematology care.  To afford each patient quality time with our provider, please arrive at least 15 minutes before your scheduled appointment time.    If you have a lab appointment with the Cancer Center please come in thru the  Main Entrance and check in at the main information desk  You need to re-schedule your appointment should you arrive 10 or more minutes late.  We strive to give you quality time with our providers, and arriving late affects you and other patients whose appointments are after yours.  Also, if you no show three or more times for appointments you may be dismissed from the clinic at the providers discretion.     Again, thank you for choosing [facility].  Our hope is that these requests will decrease the amount of time that you wait before being seen by our physicians.       _____________________________________________________________  Should you have questions after your visit to [facility], please contact our office at [phone] between the hours of 8:30 a.m. and 4:30 p.m.  Voicemails left after 4:30 p.m. will not be returned until the following business day.  For prescription refill requests, have your pharmacy contact our office.       Resources For Cancer Patients and their Caregivers ? American Cancer Society: Can assist with transportation, wigs, general needs, runs Look Good Feel Better.        1-888-227-6333 ? Cancer Care: Provides financial assistance, online support groups, medication/co-pay assistance.  1-800-813-HOPE (4673) ? [facility] Assists  [place] cancer patients and their families through emotional , educational and financial support.  336-427-4357 ? [place] DSS Where to apply for food stamps, Medicaid and utility assistance. 336-342-1394 ? RCATS: Transportation to medical appointments. 336-347-2287 ? Social Security Administration: May apply for disability if have a Stage IV cancer. 336-342-7796 1-800-772-1213 ? [place] Aging, Disability and Transit Services: Assists with nutrition, care and transit needs. 336-349-2343  Cancer Center Support Programs: @10RELATIVEDAYS@ > Cancer Support Group  2nd Tuesday of the month 1pm-2pm, Journey Room  > Creative Journey  3rd Tuesday of the month 1130am-1pm, Journey Room  > Look Good Feel Better  1st Wednesday of the month 10am-12 noon, Journey Room (Call American Cancer Society to register 1-800-395-5775)   

## 2016-04-15 NOTE — Progress Notes (Signed)
Nicole [name] presents today for injection per MD orders. B12 1000mcg administered IM in right Upper Arm. Administration without incident. Patient tolerated well.  

## 2016-04-17 ENCOUNTER — Encounter (HOSPITAL_COMMUNITY): Payer: Self-pay | Admitting: Hematology & Oncology

## 2016-05-15 ENCOUNTER — Ambulatory Visit (HOSPITAL_COMMUNITY): Payer: PPO

## 2016-05-16 ENCOUNTER — Ambulatory Visit (HOSPITAL_COMMUNITY): Payer: PPO

## 2016-05-17 ENCOUNTER — Encounter (HOSPITAL_COMMUNITY): Payer: PPO | Attending: Hematology & Oncology

## 2016-05-17 VITALS — BP 147/86 | HR 107 | Temp 98.2°F | Resp 20

## 2016-05-17 DIAGNOSIS — E538 Deficiency of other specified B group vitamins: Secondary | ICD-10-CM | POA: Insufficient documentation

## 2016-05-17 DIAGNOSIS — R197 Diarrhea, unspecified: Secondary | ICD-10-CM | POA: Insufficient documentation

## 2016-05-17 DIAGNOSIS — Z85038 Personal history of other malignant neoplasm of large intestine: Secondary | ICD-10-CM | POA: Insufficient documentation

## 2016-05-17 DIAGNOSIS — C8590 Non-Hodgkin lymphoma, unspecified, unspecified site: Secondary | ICD-10-CM | POA: Insufficient documentation

## 2016-05-17 DIAGNOSIS — C649 Malignant neoplasm of unspecified kidney, except renal pelvis: Secondary | ICD-10-CM | POA: Insufficient documentation

## 2016-05-17 MED ORDER — CYANOCOBALAMIN 1000 MCG/ML IJ SOLN
1000.0000 ug | Freq: Once | INTRAMUSCULAR | Status: AC
  Administered 2016-05-17: 1000 ug via INTRAMUSCULAR

## 2016-05-17 MED ORDER — CYANOCOBALAMIN 1000 MCG/ML IJ SOLN
INTRAMUSCULAR | Status: AC
  Filled 2016-05-17: qty 1

## 2016-05-17 NOTE — Progress Notes (Signed)
Nicole [name] presents today for injection per MD orders. B12 1000mcg administered IM in left Upper Arm. Administration without incident. Patient tolerated well.  

## 2016-05-17 NOTE — Patient Instructions (Signed)
[facility] at [facility] Discharge Instructions  RECOMMENDATIONS MADE BY THE CONSULTANT AND ANY TEST RESULTS WILL BE SENT TO YOUR REFERRING PHYSICIAN.  Vitamin B12 1000 mcg injection given as ordered. Return as scheduled.  Thank you for choosing [facility] at [facility] to provide your oncology and hematology care.  To afford each patient quality time with our provider, please arrive at least 15 minutes before your scheduled appointment time.    If you have a lab appointment with the Cancer Center please come in thru the  Main Entrance and check in at the main information desk  You need to re-schedule your appointment should you arrive 10 or more minutes late.  We strive to give you quality time with our providers, and arriving late affects you and other patients whose appointments are after yours.  Also, if you no show three or more times for appointments you may be dismissed from the clinic at the providers discretion.     Again, thank you for choosing [facility].  Our hope is that these requests will decrease the amount of time that you wait before being seen by our physicians.       _____________________________________________________________  Should you have questions after your visit to [facility], please contact our office at [phone] between the hours of 8:30 a.m. and 4:30 p.m.  Voicemails left after 4:30 p.m. will not be returned until the following business day.  For prescription refill requests, have your pharmacy contact our office.       Resources For Cancer Patients and their Caregivers ? American Cancer Society: Can assist with transportation, wigs, general needs, runs Look Good Feel Better.        1-888-227-6333 ? Cancer Care: Provides financial assistance, online support groups, medication/co-pay assistance.  1-800-813-HOPE (4673) ? [facility] Assists [place] cancer patients and their families through emotional , educational and financial support.  336-427-4357 ? [place] DSS Where to apply for food stamps, Medicaid and utility assistance. 336-342-1394 ? RCATS: Transportation to medical appointments. 336-347-2287 ? Social Security Administration: May apply for disability if have a Stage IV cancer. 336-342-7796 1-800-772-1213 ? [place] Aging, Disability and Transit Services: Assists with nutrition, care and transit needs. 336-349-2343  Cancer Center Support Programs: @10RELATIVEDAYS@ > Cancer Support Group  2nd Tuesday of the month 1pm-2pm, Journey Room  > Creative Journey  3rd Tuesday of the month 1130am-1pm, Journey Room  > Look Good Feel Better  1st Wednesday of the month 10am-12 noon, Journey Room (Call American Cancer Society to register 1-800-395-5775)   

## 2016-05-20 ENCOUNTER — Ambulatory Visit (HOSPITAL_COMMUNITY)
Admission: RE | Admit: 2016-05-20 | Discharge: 2016-05-20 | Disposition: A | Discharge status: Home / Self Care | Payer: PPO | Source: Ambulatory Visit | Attending: Family Medicine | Admitting: Family Medicine

## 2016-05-20 DIAGNOSIS — Z1231 Encounter for screening mammogram for malignant neoplasm of breast: Secondary | ICD-10-CM | POA: Diagnosis not present

## 2016-06-10 DIAGNOSIS — H52223 Regular astigmatism, bilateral: Secondary | ICD-10-CM | POA: Diagnosis not present

## 2016-06-10 DIAGNOSIS — H5203 Hypermetropia, bilateral: Secondary | ICD-10-CM | POA: Diagnosis not present

## 2016-06-10 DIAGNOSIS — H524 Presbyopia: Secondary | ICD-10-CM | POA: Diagnosis not present

## 2016-06-10 DIAGNOSIS — H35371 Puckering of macula, right eye: Secondary | ICD-10-CM | POA: Diagnosis not present

## 2016-06-17 ENCOUNTER — Encounter (HOSPITAL_COMMUNITY): Payer: PPO | Attending: Hematology & Oncology

## 2016-06-17 VITALS — BP 151/77 | HR 91 | Temp 98.1°F | Resp 20

## 2016-06-17 DIAGNOSIS — E538 Deficiency of other specified B group vitamins: Secondary | ICD-10-CM

## 2016-06-17 DIAGNOSIS — C8590 Non-Hodgkin lymphoma, unspecified, unspecified site: Secondary | ICD-10-CM | POA: Insufficient documentation

## 2016-06-17 DIAGNOSIS — C649 Malignant neoplasm of unspecified kidney, except renal pelvis: Secondary | ICD-10-CM | POA: Insufficient documentation

## 2016-06-17 DIAGNOSIS — R197 Diarrhea, unspecified: Secondary | ICD-10-CM | POA: Insufficient documentation

## 2016-06-17 DIAGNOSIS — Z85038 Personal history of other malignant neoplasm of large intestine: Secondary | ICD-10-CM | POA: Insufficient documentation

## 2016-06-17 MED ORDER — CYANOCOBALAMIN 1000 MCG/ML IJ SOLN
INTRAMUSCULAR | Status: AC
  Filled 2016-06-17: qty 1

## 2016-06-17 MED ORDER — CYANOCOBALAMIN 1000 MCG/ML IJ SOLN
1000.0000 ug | Freq: Once | INTRAMUSCULAR | Status: AC
  Administered 2016-06-17: 1000 ug via INTRAMUSCULAR

## 2016-06-17 NOTE — Progress Notes (Signed)
Nicole [name] presents today for injection per MD orders. B12 1038mcg administered IM in left Upper Arm. Administration without incident. Patient tolerated well.

## 2016-07-17 ENCOUNTER — Encounter (HOSPITAL_COMMUNITY): Payer: PPO | Attending: Hematology & Oncology

## 2016-07-17 ENCOUNTER — Encounter (HOSPITAL_COMMUNITY): Payer: Self-pay

## 2016-07-17 ENCOUNTER — Other Ambulatory Visit (HOSPITAL_COMMUNITY): Payer: Self-pay | Admitting: Adult Health

## 2016-07-17 ENCOUNTER — Telehealth (HOSPITAL_COMMUNITY): Payer: Self-pay

## 2016-07-17 VITALS — BP 124/77 | HR 91 | Temp 98.2°F | Resp 18

## 2016-07-17 DIAGNOSIS — E538 Deficiency of other specified B group vitamins: Secondary | ICD-10-CM | POA: Diagnosis not present

## 2016-07-17 DIAGNOSIS — Z85038 Personal history of other malignant neoplasm of large intestine: Secondary | ICD-10-CM | POA: Insufficient documentation

## 2016-07-17 DIAGNOSIS — C642 Malignant neoplasm of left kidney, except renal pelvis: Secondary | ICD-10-CM

## 2016-07-17 DIAGNOSIS — C8590 Non-Hodgkin lymphoma, unspecified, unspecified site: Secondary | ICD-10-CM | POA: Insufficient documentation

## 2016-07-17 DIAGNOSIS — C8200 Follicular lymphoma grade I, unspecified site: Secondary | ICD-10-CM

## 2016-07-17 DIAGNOSIS — C649 Malignant neoplasm of unspecified kidney, except renal pelvis: Secondary | ICD-10-CM | POA: Insufficient documentation

## 2016-07-17 DIAGNOSIS — Z1509 Genetic susceptibility to other malignant neoplasm: Secondary | ICD-10-CM

## 2016-07-17 DIAGNOSIS — D649 Anemia, unspecified: Secondary | ICD-10-CM

## 2016-07-17 DIAGNOSIS — R197 Diarrhea, unspecified: Secondary | ICD-10-CM | POA: Insufficient documentation

## 2016-07-17 MED ORDER — CYANOCOBALAMIN 1000 MCG/ML IJ SOLN
INTRAMUSCULAR | Status: AC
  Filled 2016-07-17: qty 1

## 2016-07-17 MED ORDER — CYANOCOBALAMIN 1000 MCG/ML IJ SOLN
1000.0000 ug | Freq: Once | INTRAMUSCULAR | Status: AC
  Administered 2016-07-17: 1000 ug via INTRAMUSCULAR

## 2016-07-17 NOTE — Patient Instructions (Signed)
[facility] at [facility] Discharge Instructions  RECOMMENDATIONS MADE BY THE CONSULTANT AND ANY TEST RESULTS WILL BE SENT TO YOUR REFERRING PHYSICIAN.  Received Vit B12 injection today. Follow-up as scheduled. Call clinic for any questions or concerns  Thank you for choosing [facility] at [facility] to provide your oncology and hematology care.  To afford each patient quality time with our provider, please arrive at least 15 minutes before your scheduled appointment time.    If you have a lab appointment with the Cancer Center please come in thru the  Main Entrance and check in at the main information desk  You need to re-schedule your appointment should you arrive 10 or more minutes late.  We strive to give you quality time with our providers, and arriving late affects you and other patients whose appointments are after yours.  Also, if you no show three or more times for appointments you may be dismissed from the clinic at the providers discretion.     Again, thank you for choosing [facility].  Our hope is that these requests will decrease the amount of time that you wait before being seen by our physicians.       _____________________________________________________________  Should you have questions after your visit to [facility], please contact our office at [phone] between the hours of 8:30 a.m. and 4:30 p.m.  Voicemails left after 4:30 p.m. will not be returned until the following business day.  For prescription refill requests, have your pharmacy contact our office.       Resources For Cancer Patients and their Caregivers ? American Cancer Society: Can assist with transportation, wigs, general needs, runs Look Good Feel Better.        1-888-227-6333 ? Cancer Care: Provides financial assistance, online support groups, medication/co-pay assistance.  1-800-813-HOPE (4673) ? [facility] Assists [place] cancer patients and their families through emotional , educational and financial support.  336-427-4357 ? [place] DSS Where to apply for food stamps, Medicaid and utility assistance. 336-342-1394 ? RCATS: Transportation to medical appointments. 336-347-2287 ? Social Security Administration: May apply for disability if have a Stage IV cancer. 336-342-7796 1-800-772-1213 ? [place] Aging, Disability and Transit Services: Assists with nutrition, care and transit needs. 336-349-2343  Cancer Center Support Programs: @10RELATIVEDAYS@ > Cancer Support Group  2nd Tuesday of the month 1pm-2pm, Journey Room  > Creative Journey  3rd Tuesday of the month 1130am-1pm, Journey Room  > Look Good Feel Better  1st Wednesday of the month 10am-12 noon, Journey Room (Call American Cancer Society to register 1-800-395-5775)   

## 2016-07-17 NOTE — Progress Notes (Signed)
Nicole [name] tolerated Vit B12 injection well without complaints or incident VSS upon discharge. Pt discharged self ambulatory in satisfactory condition

## 2016-07-17 NOTE — Telephone Encounter (Signed)
See telephone encounter note.

## 2016-07-25 ENCOUNTER — Encounter (INDEPENDENT_AMBULATORY_CARE_PROVIDER_SITE_OTHER): Payer: Self-pay | Admitting: *Deleted

## 2016-08-07 ENCOUNTER — Other Ambulatory Visit (INDEPENDENT_AMBULATORY_CARE_PROVIDER_SITE_OTHER): Payer: Self-pay | Admitting: Internal Medicine

## 2016-08-07 ENCOUNTER — Telehealth (INDEPENDENT_AMBULATORY_CARE_PROVIDER_SITE_OTHER): Payer: Self-pay | Admitting: *Deleted

## 2016-08-07 DIAGNOSIS — Z85038 Personal history of other malignant neoplasm of large intestine: Secondary | ICD-10-CM

## 2016-08-07 NOTE — Telephone Encounter (Signed)
Referring MD/PCP: knowlton   Procedure: flex sig  Reason/Indication:  Colon ca  Has patient had this procedure before?  Yes, 07/2015  If so, when, by whom and where?    Is there a family history of colon cancer?  yes  Who?  What age when diagnosed?    Is patient diabetic?   no      Does patient have prosthetic heart valve or mechanical valve?  no  Do you have a pacemaker?  no  Has patient ever had endocarditis? no  Has patient had joint replacement within last 12 months?  no  Does patient tend to be constipated or take laxatives? no  Does patient have a history of alcohol/drug use?  no  Is patient on Coumadin, Plavix and/or Aspirin? no  Medications: see epic  Allergies: see epic  Medication Adjustment per Dr Laural Golden:   Procedure date & time: 08/13/16 at 730

## 2016-08-07 NOTE — Telephone Encounter (Signed)
agree

## 2016-08-11 ENCOUNTER — Other Ambulatory Visit (HOSPITAL_COMMUNITY): Payer: Self-pay | Admitting: Hematology & Oncology

## 2016-08-11 DIAGNOSIS — R197 Diarrhea, unspecified: Principal | ICD-10-CM

## 2016-08-11 DIAGNOSIS — Z1509 Genetic susceptibility to other malignant neoplasm: Secondary | ICD-10-CM

## 2016-08-11 DIAGNOSIS — K909 Intestinal malabsorption, unspecified: Secondary | ICD-10-CM

## 2016-08-11 DIAGNOSIS — Z85038 Personal history of other malignant neoplasm of large intestine: Secondary | ICD-10-CM

## 2016-08-13 ENCOUNTER — Encounter (HOSPITAL_COMMUNITY): Payer: Self-pay

## 2016-08-13 ENCOUNTER — Ambulatory Visit (HOSPITAL_COMMUNITY)
Admission: RE | Admit: 2016-08-13 | Discharge: 2016-08-13 | Disposition: A | Discharge status: Home / Self Care | Payer: PPO | Source: Ambulatory Visit | Attending: Internal Medicine | Admitting: Internal Medicine

## 2016-08-13 ENCOUNTER — Encounter (HOSPITAL_COMMUNITY)
Admission: RE | Disposition: A | Discharge status: Home / Self Care | Payer: Self-pay | Source: Ambulatory Visit | Attending: Internal Medicine

## 2016-08-13 DIAGNOSIS — Z85528 Personal history of other malignant neoplasm of kidney: Secondary | ICD-10-CM | POA: Diagnosis not present

## 2016-08-13 DIAGNOSIS — Z91048 Other nonmedicinal substance allergy status: Secondary | ICD-10-CM | POA: Insufficient documentation

## 2016-08-13 DIAGNOSIS — Z905 Acquired absence of kidney: Secondary | ICD-10-CM | POA: Diagnosis not present

## 2016-08-13 DIAGNOSIS — Z85038 Personal history of other malignant neoplasm of large intestine: Secondary | ICD-10-CM

## 2016-08-13 DIAGNOSIS — K529 Noninfective gastroenteritis and colitis, unspecified: Secondary | ICD-10-CM | POA: Diagnosis not present

## 2016-08-13 DIAGNOSIS — Z1509 Genetic susceptibility to other malignant neoplasm: Secondary | ICD-10-CM | POA: Insufficient documentation

## 2016-08-13 DIAGNOSIS — Z885 Allergy status to narcotic agent status: Secondary | ICD-10-CM | POA: Diagnosis not present

## 2016-08-13 DIAGNOSIS — Z1211 Encounter for screening for malignant neoplasm of colon: Secondary | ICD-10-CM | POA: Insufficient documentation

## 2016-08-13 DIAGNOSIS — E538 Deficiency of other specified B group vitamins: Secondary | ICD-10-CM | POA: Insufficient documentation

## 2016-08-13 DIAGNOSIS — Z980 Intestinal bypass and anastomosis status: Secondary | ICD-10-CM | POA: Diagnosis not present

## 2016-08-13 DIAGNOSIS — C8590 Non-Hodgkin lymphoma, unspecified, unspecified site: Secondary | ICD-10-CM | POA: Diagnosis not present

## 2016-08-13 DIAGNOSIS — Z800 Family history of malignant neoplasm of digestive organs: Secondary | ICD-10-CM | POA: Diagnosis not present

## 2016-08-13 DIAGNOSIS — Z91040 Latex allergy status: Secondary | ICD-10-CM | POA: Insufficient documentation

## 2016-08-13 DIAGNOSIS — Z08 Encounter for follow-up examination after completed treatment for malignant neoplasm: Secondary | ICD-10-CM | POA: Diagnosis not present

## 2016-08-13 HISTORY — PX: FLEXIBLE SIGMOIDOSCOPY: SHX5431

## 2016-08-13 SURGERY — SIGMOIDOSCOPY, FLEXIBLE
Anesthesia: Moderate Sedation

## 2016-08-13 MED ORDER — SIMETHICONE 40 MG/0.6ML PO SUSP
ORAL | Status: AC
  Filled 2016-08-13: qty 30

## 2016-08-13 NOTE — Op Note (Signed)
[facility] Patient Name: Nicole [name] Procedure Date: 08/13/2016 7:14 AM MRN: [id number] Date of Birth: Dec 08, 1950 Attending MD: Hildred Laser , MD CSN: [id number] Age: 66 Admit Type: Outpatient Procedure:                Flexible Sigmoidoscopy Indications:              High risk colon cancer surveillance: Personal                            history of colon cancer, High risk colon cancer                            surveillance: Personal history of hereditary                            nonpolyposis colorectal cancer (Lynch Syndrome) Providers:                Hildred Laser, MD, Otis Peak B. Sharon Seller, RN, Randa Spike, Technician Referring MD:             Lemmie Evens, MD Medicines:                None Complications:            No immediate complications. Estimated Blood Loss:     Estimated blood loss: none. Procedure:                Pre-Anesthesia Assessment:                           - Prior to the procedure, a History and Physical                            was performed, and patient medications and                            allergies were reviewed. The patient's tolerance of                            previous anesthesia was also reviewed. The risks                            and benefits of the procedure and the sedation                            options and risks were discussed with the patient.                            All questions were answered, and informed consent                            was obtained. Prior Anticoagulants: The patient  last took ibuprofen 1 day prior to the procedure.                            ASA Grade Assessment: I - A normal, healthy                            patient. After reviewing the risks and benefits,                            the patient was deemed in satisfactory condition to                            undergo the procedure.                           After obtaining informed  consent, the scope was                            passed under direct vision. The was introduced                            through the anus and advanced to the the                            ileo-sigmoid anastomosis. The flexible                            sigmoidoscopy was accomplished without difficulty.                            The patient tolerated the procedure well. The                            quality of the bowel preparation was excellent. Scope In: 7:39:45 AM Scope Out: 7:45:17 AM Total Procedure Duration: 0 hours 5 minutes 32 seconds  Findings:      The perianal and digital rectal examinations were normal.      The neo-terminal ileum appeared normal.      There was evidence of a prior end-to-end ileo-colonic anastomosis at 25       cm proximal to the anus. This was patent and was characterized by       healthy appearing mucosa. The anastomosis was traversed.      The recto-sigmoid colon appeared normal.      The rectum appeared normal.      No additional abnormalities were found on retroflexion. Impression:               - The terminal ileum is normal.                           - Patent end-to-end ileo-colonic anastomosis,                            characterized by healthy appearing mucosa.                           -  The recto-sigmoid colon is normal.                           - The rectum is normal.                           - No specimens collected. Moderate Sedation:      None Recommendation:           - Discharge patient to home (ambulatory).                           - Resume previous diet today.                           - Continue present medications.                           - Repeat flexible sigmoidoscopy in 1 year for                            surveillance. Procedure Code(s):        --- Professional ---                           [phone], Sigmoidoscopy, flexible; diagnostic,                            including collection of specimen(s) by brushing or                             washing, when performed (separate procedure) Diagnosis Code(s):        --- Professional ---                           N46.270, Personal history of other malignant                            neoplasm of large intestine                           Z15.09, Genetic susceptibility to other malignant                            neoplasm                           Z98.0, Intestinal bypass and anastomosis status CPT copyright 2016 American Medical Association. All rights reserved. The codes documented in this report are preliminary and upon coder review may  be revised to meet current compliance requirements. Hildred Laser, MD Hildred Laser, MD 08/13/2016 7:57:05 AM This report has been signed electronically. Number of Addenda: 0

## 2016-08-13 NOTE — Discharge Instructions (Signed)
Resume usual medications and diet. Next flexible sigmoidoscopy in one year.

## 2016-08-13 NOTE — H&P (Signed)
Nicole [name] is an 66 y.o. female.   Chief Complaint: Patient is here for flexible sigmoidoscopy. HPI: Patient is 66 year old Caucasian female with history of colon carcinoma and Lynch syndrome who is here for surveillance flexible sigmoidoscopy. She says rectum and rectosigmoid in situ. Rest of her colon has been removed. She has 3-4 bowel movements per day. She is getting better not taking Lomotil daily. She denies abdominal pain or rectal bleeding. History significant for CRC in mother and maternal grandfather. Her son was diagnosed with duodenal adenocarcinoma at age 88 and underwent triple procedure. About 6 years later he was diagnosed with lung and sigmoid colon primary he is deceased.  Past Medical History:  Diagnosis Date  . Arthritis    shorulder  . B12 deficiency   . Chronic diarrhea SECONDARY HX COLON CANCER  . Diarrhea   . History of colon cancer [facility] SYNDROME ---  COLORECTAL CANCER S/P COLECTOMY X3  LAST ONE 2007   NO RECURRENCE  . History of colon cancer 05/04/2013  . History of kidney infection 08/2013  . History of kidney stones   . History of MRSA infection 08/2013   left leg  . Hx of osteopenia   . Kidney stone   . Lynch syndrome 10/17/2010   Hereditary colon cancer  . NHL (non-Hodgkin's lymphoma) ([place]) 10/17/2010   MONITORED BY DR Tressie Stalker  . Personal history of renal cancer S/P LEFT PARTIAL NEPHRECTOMY 2007--  NO RECURRENCE   DUE TO KIDNEY CANCER  . Right shoulder pain S/P ROTATOR CUFF REPAIR    Past Surgical History:  Procedure Laterality Date  . APPENDECTOMY  1993   W/ LAVH  . COLONOSCOPY W/ POLYPECTOMY    . CYSTOSCOPY W/ RETROGRADES  07/14/2014   Procedure: CYSTOSCOPY WITH RETROGRADE PYELOGRAM;  Surgeon: Franchot Gallo, MD;  Location: [facility];  Service: Urology;;  . Consuela Mimes W/ URETERAL STENT REMOVAL Left 07/14/2014   Procedure: CYSTOSCOPY WITH STENT REMOVAL;  Surgeon: Franchot Gallo, MD;  Location[facility];   Service: Urology;  Laterality: Left;  . CYSTOSCOPY WITH STENT PLACEMENT  02/07/2012   Procedure: CYSTOSCOPY WITH STENT PLACEMENT;  Surgeon: Franchot Gallo, MD;  Location[facility];  Service: Urology;  Laterality: Left;  . CYSTOSCOPY WITH STENT PLACEMENT Left 07/14/2014   Procedure: CYSTOSCOPY WITH STENT PLACEMENT;  Surgeon: Franchot Gallo, MD;  Location: [facility];  Service: Urology;  Laterality: Left;  . FLEXIBLE SIGMOIDOSCOPY  08/29/2011   Procedure: FLEXIBLE SIGMOIDOSCOPY;  Surgeon: Rogene Houston, MD;  [facility];  Service: Endoscopy;  Laterality: N/A;  930  . FLEXIBLE SIGMOIDOSCOPY N/A 09/28/2012   Procedure: FLEXIBLE SIGMOIDOSCOPY;  Surgeon: Rogene Houston, MD;  [facility];  Service: Endoscopy;  Laterality: N/A;  730  . FLEXIBLE SIGMOIDOSCOPY N/A 07/08/2014   Procedure: FLEXIBLE SIGMOIDOSCOPY;  Surgeon: Rogene Houston, MD;  [facility];  Service: Endoscopy;  Laterality: N/A;  830 - moved to 10:20 - Ann to notify pt  . FLEXIBLE SIGMOIDOSCOPY N/A 08/11/2015   Procedure: FLEXIBLE SIGMOIDOSCOPY;  Surgeon: Rogene Houston, MD;  [facility];  Service: Endoscopy;  Laterality: N/A;  730  . HEMICOLECTOMY  2001   right  . HEMICOLECTOMY  2002   left  . HOLMIUM LASER APPLICATION Left 07/14/7679   Procedure: HOLMIUM LASER LITHOTRIPSY,;  Surgeon: Franchot Gallo, MD;  Location: [facility];  Service: Urology;  Laterality: Left;  . kidney resection  2007   left  partial  . LAPAROSCOPIC ASSISTED VAGINAL HYSTERECTOMY  1993   W/ BILATERAL SALPINGO-OOPHORECTOMY  . LEFT FLANK EXPLORATION W/  PARTIAL LEFT NEPHRECTOMY/ LEFT HILAR LYMPH NODE BX  04-17-2005  DR DAHLSTEDT   RENAL CELL CARCINOMA  . LITHOTRIPSY  11-22013   x2  . NEPHROLITHOTOMY Left 06/09/2014   Procedure: NEPHROLITHOTOMY PERCUTANEOUS;  Surgeon: Franchot Gallo, MD;  Location: [facility];  Service: Urology;  Laterality: Left;  with STENT  .  RIGHT ROTATOR  CUFF REPAIR/   BICEP REPAIR  10-17-2011  DR SUPPLE  . SUBTOTAL COLECTOMY  2007  . URETEROSCOPY WITH HOLMIUM LASER LITHOTRIPSY Left 07/14/2014   Procedure: URETEROSCOPY  EXTRACTION OF STONES;  Surgeon: Franchot Gallo, MD;  Location: [facility];  Service: Urology;  Laterality: Left;    Family History  Problem Relation Age of Onset  . Cancer Son    Social History:  reports that she has never smoked. She has never used smokeless tobacco. She reports that she does not drink alcohol or use drugs.  Allergies:  Allergies  Allergen Reactions  . Latex Rash  . Morphine And Related Other (See Comments)    hallucinations  . Tape Rash    Certain tapes     Medications Prior to Admission  Medication Sig Dispense Refill  . Biotin 5000 MCG CAPS Take 5,000 mcg by mouth daily.    . cyanocobalamin (,VITAMIN B-12,) 1000 MCG/ML injection Inject 1 mL (1,000 mcg total) into the muscle once. (Patient taking differently: Inject 1,000 mcg into the muscle every 30 (thirty) days. ) 1 mL 11  . diphenoxylate-atropine (LOMOTIL) 2.5-0.025 MG tablet TAKE 2 TABLETS BY MOUTH THREE TIMES DAILY BEFORE MEALS 180 tablet 2  . Echinacea 450 MG CAPS Take 450 mg by mouth every other day.    . furosemide (LASIX) 40 MG tablet Take 40 mg by mouth daily as needed for fluid.    Marland Kitchen ibuprofen (ADVIL,MOTRIN) 800 MG tablet Take 800 mg by mouth 2 (two) times daily as needed for pain.  11  . Potassium Citrate 15 MEQ (1620 MG) TBCR Take 15 mEq by mouth 3 (three) times daily.  1    No results found for this or any previous visit (from the past 48 hour(s)). No results found.  ROS  Blood pressure 131/86, pulse 74, temperature 99.5 F (37.5 C), temperature source Oral, resp. rate 14, SpO2 100 %. Physical Exam  Constitutional: She appears well-developed and well-nourished.  HENT:  Mouth/Throat: Oropharynx is clear and moist.  Eyes: Conjunctivae are normal. No scleral icterus.  Neck: No thyromegaly  present.  Cardiovascular: Normal rate, regular rhythm and normal heart sounds.   No murmur heard. Respiratory: Effort normal and breath sounds normal.  GI:  Abdomen is full with upper and lower midline scars. No organomegaly or masses.  Musculoskeletal: She exhibits no edema.  Lymphadenopathy:    She has no cervical adenopathy.  Neurological: She is alert.  Skin: Skin is warm and dry.     Assessment/Plan History of colon carcinoma and syndrome. Flexible sigmoidoscopy.  Hildred Laser, MD 08/13/2016, 7:32 AM

## 2016-08-16 ENCOUNTER — Encounter (HOSPITAL_COMMUNITY): Payer: Self-pay | Admitting: Internal Medicine

## 2016-08-16 ENCOUNTER — Encounter (HOSPITAL_COMMUNITY): Payer: PPO | Attending: Hematology & Oncology

## 2016-08-16 ENCOUNTER — Encounter (HOSPITAL_COMMUNITY): Payer: PPO

## 2016-08-16 VITALS — BP 127/73 | HR 80 | Temp 98.4°F | Resp 20

## 2016-08-16 DIAGNOSIS — Z85038 Personal history of other malignant neoplasm of large intestine: Secondary | ICD-10-CM | POA: Diagnosis not present

## 2016-08-16 DIAGNOSIS — R197 Diarrhea, unspecified: Secondary | ICD-10-CM | POA: Insufficient documentation

## 2016-08-16 DIAGNOSIS — E538 Deficiency of other specified B group vitamins: Secondary | ICD-10-CM | POA: Insufficient documentation

## 2016-08-16 DIAGNOSIS — C642 Malignant neoplasm of left kidney, except renal pelvis: Secondary | ICD-10-CM

## 2016-08-16 DIAGNOSIS — C8200 Follicular lymphoma grade I, unspecified site: Secondary | ICD-10-CM

## 2016-08-16 DIAGNOSIS — Z1509 Genetic susceptibility to other malignant neoplasm: Secondary | ICD-10-CM

## 2016-08-16 DIAGNOSIS — C649 Malignant neoplasm of unspecified kidney, except renal pelvis: Secondary | ICD-10-CM | POA: Insufficient documentation

## 2016-08-16 DIAGNOSIS — D649 Anemia, unspecified: Secondary | ICD-10-CM

## 2016-08-16 DIAGNOSIS — C8590 Non-Hodgkin lymphoma, unspecified, unspecified site: Secondary | ICD-10-CM | POA: Insufficient documentation

## 2016-08-16 LAB — COMPREHENSIVE METABOLIC PANEL
ALT: 23 U/L (ref 14–54)
AST: 21 U/L (ref 15–41)
Albumin: 4 g/dL (ref 3.5–5.0)
Alkaline Phosphatase: 67 U/L (ref 38–126)
Anion gap: 9 (ref 5–15)
BILIRUBIN TOTAL: 0.6 mg/dL (ref 0.3–1.2)
BUN: 24 mg/dL — AB (ref 6–20)
CALCIUM: 9.6 mg/dL (ref 8.9–10.3)
CHLORIDE: 111 mmol/L (ref 101–111)
CO2: 25 mmol/L (ref 22–32)
CREATININE: 0.85 mg/dL (ref 0.44–1.00)
Glucose, Bld: 92 mg/dL (ref 65–99)
Potassium: 4.1 mmol/L (ref 3.5–5.1)
Sodium: 145 mmol/L (ref 135–145)
TOTAL PROTEIN: 7.2 g/dL (ref 6.5–8.1)

## 2016-08-16 LAB — CBC WITH DIFFERENTIAL/PLATELET
Basophils Absolute: 0.1 10*3/uL (ref 0.0–0.1)
Basophils Relative: 1 %
EOS PCT: 3 %
Eosinophils Absolute: 0.3 10*3/uL (ref 0.0–0.7)
HEMATOCRIT: 39.9 % (ref 36.0–46.0)
Hemoglobin: 13.1 g/dL (ref 12.0–15.0)
LYMPHS ABS: 2.2 10*3/uL (ref 0.7–4.0)
LYMPHS PCT: 21 %
MCH: 30.1 pg (ref 26.0–34.0)
MCHC: 32.8 g/dL (ref 30.0–36.0)
MCV: 91.7 fL (ref 78.0–100.0)
MONO ABS: 1.2 10*3/uL — AB (ref 0.1–1.0)
Monocytes Relative: 12 %
NEUTROS ABS: 6.6 10*3/uL (ref 1.7–7.7)
Neutrophils Relative %: 63 %
PLATELETS: 186 10*3/uL (ref 150–400)
RBC: 4.35 MIL/uL (ref 3.87–5.11)
RDW: 13.1 % (ref 11.5–15.5)
WBC: 10.4 10*3/uL (ref 4.0–10.5)

## 2016-08-16 LAB — FERRITIN: Ferritin: 52 ng/mL (ref 11–307)

## 2016-08-16 LAB — IRON AND TIBC
IRON: 92 ug/dL (ref 28–170)
Saturation Ratios: 20 % (ref 10.4–31.8)
TIBC: 449 ug/dL (ref 250–450)
UIBC: 357 ug/dL

## 2016-08-16 LAB — VITAMIN B12: VITAMIN B 12: 225 pg/mL (ref 180–914)

## 2016-08-16 LAB — FOLATE: Folate: 14.6 ng/mL (ref >5.9)

## 2016-08-16 LAB — LACTATE DEHYDROGENASE: LDH: 139 U/L (ref 98–192)

## 2016-08-16 MED ORDER — CYANOCOBALAMIN 1000 MCG/ML IJ SOLN
INTRAMUSCULAR | Status: AC
  Filled 2016-08-16: qty 1

## 2016-08-16 MED ORDER — CYANOCOBALAMIN 1000 MCG/ML IJ SOLN
1000.0000 ug | Freq: Once | INTRAMUSCULAR | Status: AC
  Administered 2016-08-16: 1000 ug via INTRAMUSCULAR

## 2016-08-16 NOTE — Progress Notes (Signed)
Nicole [name] presents today for injection per MD orders. B12 1071mcg administered IM in right Upper Arm. Administration without incident. Patient tolerated well.

## 2016-08-17 LAB — CEA: CEA: 2.6 ng/mL (ref 0.0–4.7)

## 2016-09-13 ENCOUNTER — Encounter (HOSPITAL_BASED_OUTPATIENT_CLINIC_OR_DEPARTMENT_OTHER): Payer: PPO

## 2016-09-13 VITALS — BP 155/71 | HR 79 | Temp 98.3°F | Resp 18

## 2016-09-13 DIAGNOSIS — E538 Deficiency of other specified B group vitamins: Secondary | ICD-10-CM | POA: Diagnosis not present

## 2016-09-13 MED ORDER — CYANOCOBALAMIN 1000 MCG/ML IJ SOLN
INTRAMUSCULAR | Status: AC
  Filled 2016-09-13: qty 1

## 2016-09-13 MED ORDER — CYANOCOBALAMIN 1000 MCG/ML IJ SOLN
1000.0000 ug | Freq: Once | INTRAMUSCULAR | Status: AC
  Administered 2016-09-13: 1000 ug via INTRAMUSCULAR

## 2016-09-13 NOTE — Patient Instructions (Signed)
[facility] at [facility] Discharge Instructions  RECOMMENDATIONS MADE BY THE CONSULTANT AND ANY TEST RESULTS WILL BE SENT TO YOUR REFERRING PHYSICIAN.  Vitamin B12 1000 mcg injection given as ordered. Return as scheduled.  Thank you for choosing [facility] at [facility] to provide your oncology and hematology care.  To afford each patient quality time with our provider, please arrive at least 15 minutes before your scheduled appointment time.    If you have a lab appointment with the Cancer Center please come in thru the  Main Entrance and check in at the main information desk  You need to re-schedule your appointment should you arrive 10 or more minutes late.  We strive to give you quality time with our providers, and arriving late affects you and other patients whose appointments are after yours.  Also, if you no show three or more times for appointments you may be dismissed from the clinic at the providers discretion.     Again, thank you for choosing [facility].  Our hope is that these requests will decrease the amount of time that you wait before being seen by our physicians.       _____________________________________________________________  Should you have questions after your visit to [facility], please contact our office at [phone] between the hours of 8:30 a.m. and 4:30 p.m.  Voicemails left after 4:30 p.m. will not be returned until the following business day.  For prescription refill requests, have your pharmacy contact our office.       Resources For Cancer Patients and their Caregivers ? American Cancer Society: Can assist with transportation, wigs, general needs, runs Look Good Feel Better.        1-888-227-6333 ? Cancer Care: Provides financial assistance, online support groups, medication/co-pay assistance.  1-800-813-HOPE (4673) ? [facility] Assists [place] cancer patients and their families through emotional , educational and financial support.  336-427-4357 ? [place] DSS Where to apply for food stamps, Medicaid and utility assistance. 336-342-1394 ? RCATS: Transportation to medical appointments. 336-347-2287 ? Social Security Administration: May apply for disability if have a Stage IV cancer. 336-342-7796 1-800-772-1213 ? [place] Aging, Disability and Transit Services: Assists with nutrition, care and transit needs. 336-349-2343  Cancer Center Support Programs: @10RELATIVEDAYS@ > Cancer Support Group  2nd Tuesday of the month 1pm-2pm, Journey Room  > Creative Journey  3rd Tuesday of the month 1130am-1pm, Journey Room  > Look Good Feel Better  1st Wednesday of the month 10am-12 noon, Journey Room (Call American Cancer Society to register 1-800-395-5775)   

## 2016-09-13 NOTE — Progress Notes (Signed)
Nicole [name] presents today for injection per MD orders. B12 1000 mcg administered IM in right Upper Arm. Administration without incident. Patient tolerated well. Stable and ambulatory on discharge home to self.

## 2016-10-14 ENCOUNTER — Encounter (HOSPITAL_COMMUNITY): Payer: PPO | Attending: Hematology & Oncology

## 2016-10-14 VITALS — BP 147/86 | HR 93 | Temp 99.0°F | Resp 20

## 2016-10-14 DIAGNOSIS — E538 Deficiency of other specified B group vitamins: Secondary | ICD-10-CM | POA: Diagnosis not present

## 2016-10-14 DIAGNOSIS — C8590 Non-Hodgkin lymphoma, unspecified, unspecified site: Secondary | ICD-10-CM | POA: Insufficient documentation

## 2016-10-14 DIAGNOSIS — R197 Diarrhea, unspecified: Secondary | ICD-10-CM | POA: Insufficient documentation

## 2016-10-14 DIAGNOSIS — C649 Malignant neoplasm of unspecified kidney, except renal pelvis: Secondary | ICD-10-CM | POA: Insufficient documentation

## 2016-10-14 DIAGNOSIS — Z85038 Personal history of other malignant neoplasm of large intestine: Secondary | ICD-10-CM | POA: Insufficient documentation

## 2016-10-14 MED ORDER — CYANOCOBALAMIN 1000 MCG/ML IJ SOLN
INTRAMUSCULAR | Status: AC
  Filled 2016-10-14: qty 1

## 2016-10-14 MED ORDER — CYANOCOBALAMIN 1000 MCG/ML IJ SOLN
1000.0000 ug | Freq: Once | INTRAMUSCULAR | Status: AC
  Administered 2016-10-14: 1000 ug via INTRAMUSCULAR

## 2016-10-14 NOTE — Progress Notes (Signed)
Nicole [name] presents today for injection per MD orders. B12 1057mcg administered IM in left Upper Arm. Administration without incident. Patient tolerated well.

## 2016-11-14 ENCOUNTER — Encounter (HOSPITAL_COMMUNITY): Payer: PPO | Attending: Hematology & Oncology

## 2016-11-14 VITALS — BP 156/86 | HR 97 | Temp 97.8°F | Resp 18

## 2016-11-14 DIAGNOSIS — C649 Malignant neoplasm of unspecified kidney, except renal pelvis: Secondary | ICD-10-CM | POA: Insufficient documentation

## 2016-11-14 DIAGNOSIS — E538 Deficiency of other specified B group vitamins: Secondary | ICD-10-CM

## 2016-11-14 DIAGNOSIS — Z85038 Personal history of other malignant neoplasm of large intestine: Secondary | ICD-10-CM | POA: Insufficient documentation

## 2016-11-14 DIAGNOSIS — C8590 Non-Hodgkin lymphoma, unspecified, unspecified site: Secondary | ICD-10-CM | POA: Insufficient documentation

## 2016-11-14 DIAGNOSIS — R197 Diarrhea, unspecified: Secondary | ICD-10-CM | POA: Insufficient documentation

## 2016-11-14 MED ORDER — CYANOCOBALAMIN 1000 MCG/ML IJ SOLN
1000.0000 ug | Freq: Once | INTRAMUSCULAR | Status: AC
  Administered 2016-11-14: 1000 ug via INTRAMUSCULAR

## 2016-11-14 MED ORDER — CYANOCOBALAMIN 1000 MCG/ML IJ SOLN
INTRAMUSCULAR | Status: AC
Start: 2016-11-14 — End: ?
  Filled 2016-11-14: qty 1

## 2016-11-14 NOTE — Patient Instructions (Signed)
[place] at [facility]  Discharge Instructions:  You received b12 shot.  Keep shceduled appointments.  _______________________________________________________________  Thank you for choosing [place] at [facility] to provide your oncology and hematology care.  To afford each patient quality time with our providers, please arrive at least 15 minutes before your scheduled appointment.  You need to re-schedule your appointment if you arrive 10 or more minutes late.  We strive to give you quality time with our providers, and arriving late affects you and other patients whose appointments are after yours.  Also, if you no show three or more times for appointments you may be dismissed from the clinic.  Again, thank you for choosing [place] [facility] [place] hope is that these requests will allow you access to exceptional care and in a timely manner. _______________________________________________________________  If you have questions after your visit, please contact our office at [phone] between the hours of 8:30 a.m. and 5:00 p.m. Voicemails left after 4:30 p.m. will not be returned until the following business day. _______________________________________________________________  For prescription refill requests, have your pharmacy contact our office. _______________________________________________________________  Recommendations made by the consultant and any test results will be sent to your referring physician. _______________________________________________________________

## 2016-11-14 NOTE — Progress Notes (Signed)
Patient tolerated b12 shot well with no complaints.  VSS with discharge and left ambulatory.  No complaints voiced with shot and band aid clean and dry.

## 2016-12-16 ENCOUNTER — Encounter (HOSPITAL_COMMUNITY): Payer: PPO | Attending: Hematology & Oncology

## 2016-12-16 ENCOUNTER — Encounter (HOSPITAL_COMMUNITY): Payer: Self-pay

## 2016-12-16 VITALS — BP 168/92 | HR 86 | Temp 97.7°F | Resp 18

## 2016-12-16 DIAGNOSIS — C649 Malignant neoplasm of unspecified kidney, except renal pelvis: Secondary | ICD-10-CM | POA: Insufficient documentation

## 2016-12-16 DIAGNOSIS — E538 Deficiency of other specified B group vitamins: Secondary | ICD-10-CM | POA: Diagnosis not present

## 2016-12-16 DIAGNOSIS — Z23 Encounter for immunization: Secondary | ICD-10-CM | POA: Diagnosis not present

## 2016-12-16 DIAGNOSIS — R197 Diarrhea, unspecified: Secondary | ICD-10-CM | POA: Insufficient documentation

## 2016-12-16 DIAGNOSIS — C8590 Non-Hodgkin lymphoma, unspecified, unspecified site: Secondary | ICD-10-CM | POA: Insufficient documentation

## 2016-12-16 DIAGNOSIS — Z85038 Personal history of other malignant neoplasm of large intestine: Secondary | ICD-10-CM | POA: Insufficient documentation

## 2016-12-16 MED ORDER — INFLUENZA VAC SPLIT QUAD 0.5 ML IM SUSY
PREFILLED_SYRINGE | INTRAMUSCULAR | Status: AC
  Filled 2016-12-16: qty 0.5

## 2016-12-16 MED ORDER — CYANOCOBALAMIN 1000 MCG/ML IJ SOLN
1000.0000 ug | Freq: Once | INTRAMUSCULAR | Status: AC
  Administered 2016-12-16: 1000 ug via INTRAMUSCULAR

## 2016-12-16 MED ORDER — INFLUENZA VAC SPLIT QUAD 0.5 ML IM SUSY
0.5000 mL | PREFILLED_SYRINGE | Freq: Once | INTRAMUSCULAR | Status: AC
  Administered 2016-12-16: 0.5 mL via INTRAMUSCULAR

## 2016-12-16 NOTE — Patient Instructions (Signed)
[place] at [facility] Discharge Instructions  RECOMMENDATIONS MADE BY THE CONSULTANT AND ANY TEST RESULTS WILL BE SENT TO YOUR REFERRING PHYSICIAN.  B12 injection and flu vaccine given today Follow up as scheduled.  Thank you for choosing [place] at [facility] to provide your oncology and hematology care.  To afford each patient quality time with our provider, please arrive at least 15 minutes before your scheduled appointment time.    If you have a lab appointment with the [facility] please come in thru the  Main Entrance and check in at the main information desk  You need to re-schedule your appointment should you arrive 10 or more minutes late.  We strive to give you quality time with our providers, and arriving late affects you and other patients whose appointments are after yours.  Also, if you no show three or more times for appointments you may be dismissed from the clinic at the providers discretion.     Again, thank you for choosing [facility].  Our hope is that these requests will decrease the amount of time that you wait before being seen by our physicians.       _____________________________________________________________  Should you have questions after your visit to [facility], please contact our office at [phone] between the hours of 8:30 a.m. and 4:30 p.m.  Voicemails left after 4:30 p.m. will not be returned until the following business day.  For prescription refill requests, have your pharmacy contact our office.       Resources For Cancer Patients and their Caregivers ? American Cancer Society: Can assist with transportation, wigs, general needs, runs Look Good Feel Better.        989 391 6754 ? Cancer Care: Provides financial assistance, online support groups, medication/co-pay assistance.  1-800-813-HOPE (639)416-2638) ? [facility] Assists [place]  cancer patients and their families through emotional , educational and financial support.  450-497-3158 ? [place] DSS Where to apply for food stamps, Medicaid and utility assistance. (760) 629-2329 ? RCATS: Transportation to medical appointments. (220)295-2875 ? Social Security Administration: May apply for disability if have a Stage IV cancer. 540-607-4534 (240) 612-1070 ? [employer], Disability and Transit Services: Assists with nutrition, care and transit needs. [place] Support Programs: @10RELATIVEDAYS @ > Cancer Support Group  2nd Tuesday of the month 1pm-2pm, Journey Room  > Creative Journey  3rd Tuesday of the month 1130am-1pm, Journey Room  > Look Good Feel Better  1st Wednesday of the month 10am-12 noon, Journey Room (Call [place] to register [phone])

## 2016-12-16 NOTE — Progress Notes (Signed)
Nicole [name] presents today for injection per MD orders. B12 1,066mcg administered IM in left Upper Arm. Administration without incident. Patient tolerated well.   Nicole [name] presents today for injection per MD orders. Fluarix 0.5 ml dministered IM in right Upper Arm. Administration without incident. Patient tolerated well.  Treatment given per orders. Patient tolerated it well without problems. Vitals stable and discharged home from clinic ambulatory. Follow up as scheduled.

## 2017-01-16 ENCOUNTER — Encounter (HOSPITAL_COMMUNITY): Payer: Self-pay

## 2017-01-16 ENCOUNTER — Encounter (HOSPITAL_COMMUNITY): Payer: PPO | Attending: Hematology & Oncology

## 2017-01-16 VITALS — BP 161/73 | HR 88 | Temp 98.0°F | Resp 16

## 2017-01-16 DIAGNOSIS — C649 Malignant neoplasm of unspecified kidney, except renal pelvis: Secondary | ICD-10-CM | POA: Insufficient documentation

## 2017-01-16 DIAGNOSIS — C8590 Non-Hodgkin lymphoma, unspecified, unspecified site: Secondary | ICD-10-CM | POA: Insufficient documentation

## 2017-01-16 DIAGNOSIS — E538 Deficiency of other specified B group vitamins: Secondary | ICD-10-CM | POA: Insufficient documentation

## 2017-01-16 DIAGNOSIS — R197 Diarrhea, unspecified: Secondary | ICD-10-CM | POA: Insufficient documentation

## 2017-01-16 DIAGNOSIS — Z85038 Personal history of other malignant neoplasm of large intestine: Secondary | ICD-10-CM | POA: Insufficient documentation

## 2017-01-16 MED ORDER — CYANOCOBALAMIN 1000 MCG/ML IJ SOLN
1000.0000 ug | Freq: Once | INTRAMUSCULAR | Status: AC
  Administered 2017-01-16: 1000 ug via INTRAMUSCULAR

## 2017-01-16 MED ORDER — CYANOCOBALAMIN 1000 MCG/ML IJ SOLN
INTRAMUSCULAR | Status: AC
  Filled 2017-01-16: qty 1

## 2017-01-16 NOTE — Patient Instructions (Signed)
[facility] at [facility] Discharge Instructions  RECOMMENDATIONS MADE BY THE CONSULTANT AND ANY TEST RESULTS WILL BE SENT TO YOUR REFERRING PHYSICIAN.  B12 injection given Follow up as scheduled.  Thank you for choosing [facility] at [facility] to provide your oncology and hematology care.  To afford each patient quality time with our provider, please arrive at least 15 minutes before your scheduled appointment time.    If you have a lab appointment with the Cancer Center please come in thru the  Main Entrance and check in at the main information desk  You need to re-schedule your appointment should you arrive 10 or more minutes late.  We strive to give you quality time with our providers, and arriving late affects you and other patients whose appointments are after yours.  Also, if you no show three or more times for appointments you may be dismissed from the clinic at the providers discretion.     Again, thank you for choosing [facility].  Our hope is that these requests will decrease the amount of time that you wait before being seen by our physicians.       _____________________________________________________________  Should you have questions after your visit to [facility], please contact our office at [phone] between the hours of 8:30 a.m. and 4:30 p.m.  Voicemails left after 4:30 p.m. will not be returned until the following business day.  For prescription refill requests, have your pharmacy contact our office.       Resources For Cancer Patients and their Caregivers ? American Cancer Society: Can assist with transportation, wigs, general needs, runs Look Good Feel Better.        1-888-227-6333 ? Cancer Care: Provides financial assistance, online support groups, medication/co-pay assistance.  1-800-813-HOPE (4673) ? [facility] Assists [place] cancer patients and  their families through emotional , educational and financial support.  336-427-4357 ? [place] DSS Where to apply for food stamps, Medicaid and utility assistance. 336-342-1394 ? RCATS: Transportation to medical appointments. 336-347-2287 ? Social Security Administration: May apply for disability if have a Stage IV cancer. 336-342-7796 1-800-772-1213 ? [place] Aging, Disability and Transit Services: Assists with nutrition, care and transit needs. 336-349-2343  Cancer Center Support Programs: @10RELATIVEDAYS@ > Cancer Support Group  2nd Tuesday of the month 1pm-2pm, Journey Room  > Creative Journey  3rd Tuesday of the month 1130am-1pm, Journey Room  > Look Good Feel Better  1st Wednesday of the month 10am-12 noon, Journey Room (Call American Cancer Society to register 1-800-395-5775)   

## 2017-01-16 NOTE — Progress Notes (Signed)
Nicole [name] presents today for injection per MD orders. B12 1,053mcg administered IM  in left Upper Arm. Administration without incident. Patient tolerated well.  Treatment given per orders. Patient tolerated it well without problems. Vitals stable and discharged home from clinic ambulatory. Follow up as scheduled.

## 2017-02-17 ENCOUNTER — Encounter (HOSPITAL_COMMUNITY): Payer: Self-pay

## 2017-02-17 ENCOUNTER — Encounter (HOSPITAL_COMMUNITY): Payer: PPO | Attending: Hematology & Oncology

## 2017-02-17 VITALS — BP 159/73 | HR 98 | Temp 98.7°F | Resp 20

## 2017-02-17 DIAGNOSIS — C649 Malignant neoplasm of unspecified kidney, except renal pelvis: Secondary | ICD-10-CM | POA: Insufficient documentation

## 2017-02-17 DIAGNOSIS — E538 Deficiency of other specified B group vitamins: Secondary | ICD-10-CM

## 2017-02-17 DIAGNOSIS — R197 Diarrhea, unspecified: Secondary | ICD-10-CM | POA: Insufficient documentation

## 2017-02-17 DIAGNOSIS — C8590 Non-Hodgkin lymphoma, unspecified, unspecified site: Secondary | ICD-10-CM | POA: Insufficient documentation

## 2017-02-17 DIAGNOSIS — Z85038 Personal history of other malignant neoplasm of large intestine: Secondary | ICD-10-CM | POA: Insufficient documentation

## 2017-02-17 MED ORDER — CYANOCOBALAMIN 1000 MCG/ML IJ SOLN
INTRAMUSCULAR | Status: AC
  Filled 2017-02-17: qty 1

## 2017-02-17 MED ORDER — CYANOCOBALAMIN 1000 MCG/ML IJ SOLN
1000.0000 ug | Freq: Once | INTRAMUSCULAR | Status: AC
  Administered 2017-02-17: 1000 ug via INTRAMUSCULAR

## 2017-02-17 NOTE — Progress Notes (Signed)
Patient received a b12 shot today with no complaints voiced.  Injection site clean and dry with no bruising or swelling noted at site.  Band aid applied.  VSS with discharge and left ambulatory.

## 2017-02-17 NOTE — Patient Instructions (Signed)
[facility] at [facility]  Discharge Instructions:  You received a b12 shot today.  _______________________________________________________________  Thank you for choosing [facility] at [facility] to provide your oncology and hematology care.  To afford each patient quality time with our providers, please arrive at least 15 minutes before your scheduled appointment.  You need to re-schedule your appointment if you arrive 10 or more minutes late.  We strive to give you quality time with our providers, and arriving late affects you and other patients whose appointments are after yours.  Also, if you no show three or more times for appointments you may be dismissed from the clinic.  Again, thank you for choosing  Cancer Center at [facility]. Our hope is that these requests will allow you access to exceptional care and in a timely manner. _______________________________________________________________  If you have questions after your visit, please contact our office at [phone] between the hours of 8:30 a.m. and 5:00 p.m. Voicemails left after 4:30 p.m. will not be returned until the following business day. _______________________________________________________________  For prescription refill requests, have your pharmacy contact our office. _______________________________________________________________  Recommendations made by the consultant and any test results will be sent to your referring physician. _______________________________________________________________ 

## 2017-03-03 ENCOUNTER — Ambulatory Visit (HOSPITAL_COMMUNITY)
Admission: RE | Admit: 2017-03-03 | Discharge: 2017-03-03 | Disposition: A | Discharge status: Home / Self Care | Payer: PPO | Source: Ambulatory Visit | Attending: Urology | Admitting: Urology

## 2017-03-03 ENCOUNTER — Other Ambulatory Visit: Payer: Self-pay | Admitting: Urology

## 2017-03-03 DIAGNOSIS — N200 Calculus of kidney: Secondary | ICD-10-CM | POA: Insufficient documentation

## 2017-03-04 ENCOUNTER — Ambulatory Visit: Payer: PPO | Admitting: Urology

## 2017-03-04 DIAGNOSIS — N201 Calculus of ureter: Secondary | ICD-10-CM

## 2017-03-21 ENCOUNTER — Ambulatory Visit (HOSPITAL_COMMUNITY): Payer: PPO

## 2017-03-21 ENCOUNTER — Other Ambulatory Visit: Payer: Self-pay

## 2017-03-21 ENCOUNTER — Inpatient Hospital Stay (HOSPITAL_COMMUNITY): Payer: PPO | Attending: Oncology

## 2017-03-21 ENCOUNTER — Encounter (HOSPITAL_COMMUNITY): Payer: Self-pay

## 2017-03-21 VITALS — BP 153/71 | HR 90 | Temp 98.1°F | Resp 20

## 2017-03-21 DIAGNOSIS — R197 Diarrhea, unspecified: Secondary | ICD-10-CM | POA: Insufficient documentation

## 2017-03-21 DIAGNOSIS — E538 Deficiency of other specified B group vitamins: Secondary | ICD-10-CM | POA: Diagnosis not present

## 2017-03-21 DIAGNOSIS — Z8572 Personal history of non-Hodgkin lymphomas: Secondary | ICD-10-CM | POA: Insufficient documentation

## 2017-03-21 DIAGNOSIS — K760 Fatty (change of) liver, not elsewhere classified: Secondary | ICD-10-CM | POA: Insufficient documentation

## 2017-03-21 DIAGNOSIS — Z8614 Personal history of Methicillin resistant Staphylococcus aureus infection: Secondary | ICD-10-CM | POA: Insufficient documentation

## 2017-03-21 DIAGNOSIS — Z90710 Acquired absence of both cervix and uterus: Secondary | ICD-10-CM | POA: Insufficient documentation

## 2017-03-21 DIAGNOSIS — Z85528 Personal history of other malignant neoplasm of kidney: Secondary | ICD-10-CM | POA: Insufficient documentation

## 2017-03-21 DIAGNOSIS — R634 Abnormal weight loss: Secondary | ICD-10-CM | POA: Insufficient documentation

## 2017-03-21 DIAGNOSIS — M7989 Other specified soft tissue disorders: Secondary | ICD-10-CM | POA: Insufficient documentation

## 2017-03-21 DIAGNOSIS — N200 Calculus of kidney: Secondary | ICD-10-CM | POA: Insufficient documentation

## 2017-03-21 DIAGNOSIS — Z85038 Personal history of other malignant neoplasm of large intestine: Secondary | ICD-10-CM | POA: Insufficient documentation

## 2017-03-21 DIAGNOSIS — M129 Arthropathy, unspecified: Secondary | ICD-10-CM | POA: Insufficient documentation

## 2017-03-21 DIAGNOSIS — K8020 Calculus of gallbladder without cholecystitis without obstruction: Secondary | ICD-10-CM | POA: Insufficient documentation

## 2017-03-21 DIAGNOSIS — Z800 Family history of malignant neoplasm of digestive organs: Secondary | ICD-10-CM | POA: Insufficient documentation

## 2017-03-21 DIAGNOSIS — Z87442 Personal history of urinary calculi: Secondary | ICD-10-CM | POA: Insufficient documentation

## 2017-03-21 DIAGNOSIS — Z87440 Personal history of urinary (tract) infections: Secondary | ICD-10-CM | POA: Insufficient documentation

## 2017-03-21 DIAGNOSIS — H8110 Benign paroxysmal vertigo, unspecified ear: Secondary | ICD-10-CM | POA: Insufficient documentation

## 2017-03-21 DIAGNOSIS — Z90722 Acquired absence of ovaries, bilateral: Secondary | ICD-10-CM | POA: Insufficient documentation

## 2017-03-21 DIAGNOSIS — Z1509 Genetic susceptibility to other malignant neoplasm: Secondary | ICD-10-CM | POA: Insufficient documentation

## 2017-03-21 DIAGNOSIS — Z9049 Acquired absence of other specified parts of digestive tract: Secondary | ICD-10-CM | POA: Insufficient documentation

## 2017-03-21 DIAGNOSIS — M5136 Other intervertebral disc degeneration, lumbar region: Secondary | ICD-10-CM | POA: Insufficient documentation

## 2017-03-21 DIAGNOSIS — R109 Unspecified abdominal pain: Secondary | ICD-10-CM | POA: Insufficient documentation

## 2017-03-21 MED ORDER — CYANOCOBALAMIN 1000 MCG/ML IJ SOLN
1000.0000 ug | Freq: Once | INTRAMUSCULAR | Status: AC
  Administered 2017-03-21: 1000 ug via INTRAMUSCULAR

## 2017-03-21 NOTE — Patient Instructions (Signed)
[facility] Discharge Instructions  RECOMMENDATIONS MADE BY THE CONSULTANT AND ANY TEST RESULTS WILL BE SENT TO YOUR REFERRING PHYSICIAN.  B12 injection done Follow up as scheduled.  Thank you for choosing [facility] at [facility] to provide your oncology and hematology care.  To afford each patient quality time with our provider, please arrive at least 15 minutes before your scheduled appointment time.    If you have a lab appointment with the Cancer Center please come in thru the  Main Entrance and check in at the main information desk  You need to re-schedule your appointment should you arrive 10 or more minutes late.  We strive to give you quality time with our providers, and arriving late affects you and other patients whose appointments are after yours.  Also, if you no show three or more times for appointments you may be dismissed from the clinic at the providers discretion.     Again, thank you for choosing [facility].  Our hope is that these requests will decrease the amount of time that you wait before being seen by our physicians.       _____________________________________________________________  Should you have questions after your visit to [facility], please contact our office at [phone] between the hours of 8:30 a.m. and 4:30 p.m.  Voicemails left after 4:30 p.m. will not be returned until the following business day.  For prescription refill requests, have your pharmacy contact our office.       Resources For Cancer Patients and their Caregivers ? American Cancer Society: Can assist with transportation, wigs, general needs, runs Look Good Feel Better.        1-888-227-6333 ? Cancer Care: Provides financial assistance, online support groups, medication/co-pay assistance.  1-800-813-HOPE (4673) ? [facility] Assists [place] cancer patients and their  families through emotional , educational and financial support.  336-427-4357 ? [place] DSS Where to apply for food stamps, Medicaid and utility assistance. 336-342-1394 ? [facility]: Transportation to medical appointments. 336-347-2287 ? Social Security Administration: May apply for disability if have a Stage IV cancer. 336-342-7796 1-800-772-1213 ? [place] Aging, Disability and Transit Services: Assists with nutrition, care and transit needs. 336-349-2343  Cancer Center Support Programs: @10RELATIVEDAYS@ > Cancer Support Group  2nd Tuesday of the month 1pm-2pm, Journey Room  > Creative Journey  3rd Tuesday of the month 1130am-1pm, Journey Room  > Look Good Feel Better  1st Wednesday of the month 10am-12 noon, Journey Room (Call American Cancer Society to register 1-800-395-5775)   

## 2017-03-21 NOTE — Progress Notes (Signed)
Nicole [name] presents today for injection per MD orders. B12 1,000mg  administered IM in right Upper Arm. Administration without incident. Patient tolerated well.   Treatment given per orders. Patient tolerated it well without problems. Vitals stable and discharged home from clinic ambulatory. Follow up as scheduled.

## 2017-03-25 ENCOUNTER — Ambulatory Visit (HOSPITAL_COMMUNITY)
Admission: RE | Admit: 2017-03-25 | Discharge: 2017-03-25 | Disposition: A | Discharge status: Home / Self Care | Payer: PPO | Source: Ambulatory Visit | Attending: Hematology & Oncology | Admitting: Hematology & Oncology

## 2017-03-25 DIAGNOSIS — C8200 Follicular lymphoma grade I, unspecified site: Secondary | ICD-10-CM | POA: Insufficient documentation

## 2017-03-25 DIAGNOSIS — N200 Calculus of kidney: Secondary | ICD-10-CM | POA: Diagnosis not present

## 2017-03-25 DIAGNOSIS — C642 Malignant neoplasm of left kidney, except renal pelvis: Secondary | ICD-10-CM

## 2017-03-25 DIAGNOSIS — K8020 Calculus of gallbladder without cholecystitis without obstruction: Secondary | ICD-10-CM | POA: Diagnosis not present

## 2017-03-25 DIAGNOSIS — Z1509 Genetic susceptibility to other malignant neoplasm: Secondary | ICD-10-CM | POA: Insufficient documentation

## 2017-03-25 DIAGNOSIS — R590 Localized enlarged lymph nodes: Secondary | ICD-10-CM | POA: Diagnosis not present

## 2017-03-25 DIAGNOSIS — Z905 Acquired absence of kidney: Secondary | ICD-10-CM | POA: Insufficient documentation

## 2017-03-25 LAB — POCT I-STAT CREATININE: CREATININE: 0.8 mg/dL (ref 0.44–1.00)

## 2017-03-25 MED ORDER — IOPAMIDOL (ISOVUE-300) INJECTION 61%
100.0000 mL | Freq: Once | INTRAVENOUS | Status: AC | PRN
  Administered 2017-03-25: 100 mL via INTRAVENOUS

## 2017-03-31 ENCOUNTER — Encounter (HOSPITAL_COMMUNITY): Payer: Self-pay | Admitting: Oncology

## 2017-03-31 ENCOUNTER — Inpatient Hospital Stay (HOSPITAL_COMMUNITY): Payer: PPO

## 2017-03-31 ENCOUNTER — Inpatient Hospital Stay (HOSPITAL_BASED_OUTPATIENT_CLINIC_OR_DEPARTMENT_OTHER): Payer: PPO | Admitting: Oncology

## 2017-03-31 ENCOUNTER — Other Ambulatory Visit (HOSPITAL_COMMUNITY)
Admission: RE | Admit: 2017-03-31 | Discharge: 2017-03-31 | Disposition: A | Discharge status: Home / Self Care | Payer: PPO | Source: Ambulatory Visit | Attending: Oncology | Admitting: Oncology

## 2017-03-31 ENCOUNTER — Other Ambulatory Visit: Payer: Self-pay

## 2017-03-31 DIAGNOSIS — R197 Diarrhea, unspecified: Secondary | ICD-10-CM | POA: Diagnosis not present

## 2017-03-31 DIAGNOSIS — Z1509 Genetic susceptibility to other malignant neoplasm: Secondary | ICD-10-CM | POA: Diagnosis not present

## 2017-03-31 DIAGNOSIS — Z85528 Personal history of other malignant neoplasm of kidney: Secondary | ICD-10-CM

## 2017-03-31 DIAGNOSIS — Z9049 Acquired absence of other specified parts of digestive tract: Secondary | ICD-10-CM | POA: Diagnosis not present

## 2017-03-31 DIAGNOSIS — Z800 Family history of malignant neoplasm of digestive organs: Secondary | ICD-10-CM

## 2017-03-31 DIAGNOSIS — E538 Deficiency of other specified B group vitamins: Secondary | ICD-10-CM

## 2017-03-31 DIAGNOSIS — Z0000 Encounter for general adult medical examination without abnormal findings: Secondary | ICD-10-CM

## 2017-03-31 DIAGNOSIS — Z8614 Personal history of Methicillin resistant Staphylococcus aureus infection: Secondary | ICD-10-CM

## 2017-03-31 DIAGNOSIS — H8110 Benign paroxysmal vertigo, unspecified ear: Secondary | ICD-10-CM

## 2017-03-31 DIAGNOSIS — M7989 Other specified soft tissue disorders: Secondary | ICD-10-CM | POA: Diagnosis not present

## 2017-03-31 DIAGNOSIS — K8020 Calculus of gallbladder without cholecystitis without obstruction: Secondary | ICD-10-CM

## 2017-03-31 DIAGNOSIS — Z90710 Acquired absence of both cervix and uterus: Secondary | ICD-10-CM

## 2017-03-31 DIAGNOSIS — N200 Calculus of kidney: Secondary | ICD-10-CM

## 2017-03-31 DIAGNOSIS — Z87440 Personal history of urinary (tract) infections: Secondary | ICD-10-CM | POA: Diagnosis not present

## 2017-03-31 DIAGNOSIS — Z8572 Personal history of non-Hodgkin lymphomas: Secondary | ICD-10-CM | POA: Diagnosis not present

## 2017-03-31 DIAGNOSIS — M5136 Other intervertebral disc degeneration, lumbar region: Secondary | ICD-10-CM | POA: Diagnosis not present

## 2017-03-31 DIAGNOSIS — Z85038 Personal history of other malignant neoplasm of large intestine: Secondary | ICD-10-CM | POA: Diagnosis not present

## 2017-03-31 DIAGNOSIS — C642 Malignant neoplasm of left kidney, except renal pelvis: Secondary | ICD-10-CM

## 2017-03-31 DIAGNOSIS — K760 Fatty (change of) liver, not elsewhere classified: Secondary | ICD-10-CM | POA: Diagnosis not present

## 2017-03-31 DIAGNOSIS — R109 Unspecified abdominal pain: Secondary | ICD-10-CM | POA: Diagnosis not present

## 2017-03-31 DIAGNOSIS — Z87442 Personal history of urinary calculi: Secondary | ICD-10-CM | POA: Diagnosis not present

## 2017-03-31 DIAGNOSIS — M129 Arthropathy, unspecified: Secondary | ICD-10-CM | POA: Diagnosis not present

## 2017-03-31 DIAGNOSIS — R634 Abnormal weight loss: Secondary | ICD-10-CM

## 2017-03-31 DIAGNOSIS — Z90722 Acquired absence of ovaries, bilateral: Secondary | ICD-10-CM | POA: Diagnosis not present

## 2017-03-31 LAB — LACTATE DEHYDROGENASE: LDH: 142 U/L (ref 98–192)

## 2017-03-31 LAB — COMPREHENSIVE METABOLIC PANEL
ALBUMIN: 3.9 g/dL (ref 3.5–5.0)
ALK PHOS: 66 U/L (ref 38–126)
ALT: 18 U/L (ref 14–54)
AST: 20 U/L (ref 15–41)
Anion gap: 10 (ref 5–15)
BILIRUBIN TOTAL: 0.7 mg/dL (ref 0.3–1.2)
BUN: 17 mg/dL (ref 6–20)
CALCIUM: 8.9 mg/dL (ref 8.9–10.3)
CO2: 25 mmol/L (ref 22–32)
CREATININE: 0.74 mg/dL (ref 0.44–1.00)
Chloride: 104 mmol/L (ref 101–111)
GFR calc Af Amer: >60 mL/min (ref >60)
GFR calc non Af Amer: >60 mL/min (ref >60)
GLUCOSE: 89 mg/dL (ref 65–99)
Potassium: 4.6 mmol/L (ref 3.5–5.1)
Sodium: 139 mmol/L (ref 135–145)
Total Protein: 7 g/dL (ref 6.5–8.1)

## 2017-03-31 LAB — CBC WITH DIFFERENTIAL/PLATELET
BASOS PCT: 1 %
Basophils Absolute: 0.1 10*3/uL (ref 0.0–0.1)
EOS PCT: 3 %
Eosinophils Absolute: 0.3 10*3/uL (ref 0.0–0.7)
HCT: 39.4 % (ref 36.0–46.0)
Hemoglobin: 12.9 g/dL (ref 12.0–15.0)
Lymphocytes Relative: 22 %
Lymphs Abs: 1.9 10*3/uL (ref 0.7–4.0)
MCH: 29.9 pg (ref 26.0–34.0)
MCHC: 32.7 g/dL (ref 30.0–36.0)
MCV: 91.2 fL (ref 78.0–100.0)
MONO ABS: 0.9 10*3/uL (ref 0.1–1.0)
Monocytes Relative: 11 %
Neutro Abs: 5.5 10*3/uL (ref 1.7–7.7)
Neutrophils Relative %: 63 %
PLATELETS: 177 10*3/uL (ref 150–400)
RBC: 4.32 MIL/uL (ref 3.87–5.11)
RDW: 13 % (ref 11.5–15.5)
WBC: 8.7 10*3/uL (ref 4.0–10.5)

## 2017-03-31 LAB — IRON AND TIBC
Iron: 99 ug/dL (ref 28–170)
Saturation Ratios: 23 % (ref 10.4–31.8)
TIBC: 435 ug/dL (ref 250–450)
UIBC: 336 ug/dL

## 2017-03-31 LAB — FOLATE: Folate: 15.2 ng/mL (ref >5.9)

## 2017-03-31 LAB — FERRITIN: Ferritin: 82 ng/mL (ref 11–307)

## 2017-03-31 LAB — VITAMIN B12: Vitamin B-12: 294 pg/mL (ref 180–914)

## 2017-03-31 NOTE — Progress Notes (Signed)
Nicole Evens, MD [place] [id number]  Lynch syndrome - Plan: CEA, Lactate dehydrogenase, Comprehensive metabolic panel, CBC with Differential/Platelet, Ferritin, Iron and TIBC, Folate, Vitamin B12, CEA, Lactate dehydrogenase, Comprehensive metabolic panel, CBC with Differential/Platelet, Ferritin, Iron and TIBC, Folate, Vitamin B12  Hx of colon cancer, stage I - Plan: CT Abdomen Pelvis W Contrast  History of colon cancer - Plan: CEA, Lactate dehydrogenase, Comprehensive metabolic panel, CBC with Differential/Platelet, Ferritin, Iron and TIBC, Folate, Vitamin B12  Renal cell carcinoma of left kidney (HCC) - Plan: CEA, Lactate dehydrogenase, Comprehensive metabolic panel, CBC with Differential/Platelet, Ferritin, Iron and TIBC, Folate, Vitamin B12, CT Abdomen Pelvis W Contrast  Health care maintenance - Plan: MM SCREENING BREAST TOMO BILATERAL  CURRENT THERAPY: Surveillance  INTERVAL HISTORY: Nicole [name] 67 y.o. 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 S/P hysterectomy with bilateral salpingo-oophorectomy in 1993 by Dr. Heide Spark. AND H/O Follicular-type NHL, well-differentiated with CD20 positivity, grade 1 found at the time of sigmoid colon resection in 2007 with 2 involved lymph nodes. AND Colon cancer x 3. S/P subtotal colectomy with ileorectal anastomosis by Dr. Lenise Arena at [facility] on 12/11/2005. She will undergo flexible sigmoidoscopy annually by Dr. Laural Golden and we will follow CEAs AND Renal cancer, S/P left partial nephrectomy by Dr. Beatrix Fetters. AND Deceased son at young age from pancreatic cancer. (?Lynch Syndrome)  Chart  reviewed.   Nicole [name] returns to the [place] unaccompanied today. She continues vitamin B12 injections monthly. States she is doing well.  Energy and appetite is 100%.  Patient notes no issues with her bowel movements. She continues to struggle with her weight and does not regularly see PCP because she was supposed to have lost weight.  She has every day arthritic pains that she takes ibuprofen daily that helps.  Her abnormal heart rhythms that she attributed to anxiety during last visit has resolved.  She states she looked at her [facility] at both her mammogram from March 2018 and CT scan of her abdomen pelvis and has already seen her results.  She is happy with both.  She continues to have dizziness when standing and attributes this to her benign positional vertigo.  Complains of chronic numbness/burning and tingling in left hand attributes this to  carpal tunnel.  She complains of bilateral lower extremity swelling relieved with elevation and rest and occasionally she takes p.o. Lasix prescribed by PCP.  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  She is ready for warmer weather so she can get out and start walking.  Otherwise she offers no significant complaints.   We reviewed both scans together.  All questions answered.  Past Medical History:  Diagnosis Date  . Arthritis    shorulder  . B12 deficiency   . Chronic diarrhea SECONDARY HX COLON CANCER  . Diarrhea   . History of colon cancer [facility] SYNDROME ---  COLORECTAL CANCER S/P COLECTOMY X3  LAST ONE 2007   NO RECURRENCE  . History of colon cancer 05/04/2013  .  History of kidney infection 08/2013  . History of kidney stones   . History of MRSA infection 08/2013   left leg  . Hx of osteopenia   . Kidney stone   . Lynch syndrome 10/17/2010   Hereditary colon cancer  . NHL (non-Hodgkin's lymphoma) ([place]) 10/17/2010    MONITORED BY DR Tressie Stalker  . Personal history of renal cancer S/P LEFT PARTIAL NEPHRECTOMY 2007--  NO RECURRENCE   DUE TO KIDNEY CANCER  . Right shoulder pain S/P ROTATOR CUFF REPAIR    has Lynch syndrome; Renal cell carcinoma ([place]); NHL (non-Hodgkin's lymphoma) ([place]); Obesity; B12 deficiency; Rotator cuff tear, right; Pain in joint, shoulder region; Muscle weakness (generalized); History of colon cancer; Renal calculi; Renal stones; and Hx of colon cancer, stage I on their problem list.     is allergic to latex; morphine and related; and tape.  Nicole [name] had no medications administered during this visit.  Past Surgical History:  Procedure Laterality Date  . APPENDECTOMY  1993   W/ LAVH  . COLONOSCOPY W/ POLYPECTOMY    . CYSTOSCOPY W/ RETROGRADES  07/14/2014   Procedure: CYSTOSCOPY WITH RETROGRADE PYELOGRAM;  Surgeon: Franchot Gallo, MD;  Location: [facility];  Service: Urology;;  . Consuela Mimes W/ URETERAL STENT REMOVAL Left 07/14/2014   Procedure: CYSTOSCOPY WITH STENT REMOVAL;  Surgeon: Franchot Gallo, MD;  Location: [facility];  Service: Urology;  Laterality: Left;  . CYSTOSCOPY WITH STENT PLACEMENT  02/07/2012   Procedure: CYSTOSCOPY WITH STENT PLACEMENT;  Surgeon: Franchot Gallo, MD;  Location: [facility];  Service: Urology;  Laterality: Left;  . CYSTOSCOPY WITH STENT PLACEMENT Left 07/14/2014   Procedure: CYSTOSCOPY WITH STENT PLACEMENT;  Surgeon: Franchot Gallo, MD;  [facility];  Service: Urology;  Laterality: Left;  . FLEXIBLE SIGMOIDOSCOPY  08/29/2011   Procedure: FLEXIBLE SIGMOIDOSCOPY;  Surgeon: Rogene Houston, MD;  [facility];  Service: Endoscopy;  Laterality: N/A;  930  . FLEXIBLE SIGMOIDOSCOPY N/A 09/28/2012   Procedure: FLEXIBLE SIGMOIDOSCOPY;  Surgeon: Rogene Houston, MD;  [facility];  Service: Endoscopy;  Laterality: N/A;  730  . FLEXIBLE SIGMOIDOSCOPY N/A  07/08/2014   Procedure: FLEXIBLE SIGMOIDOSCOPY;  Surgeon: Rogene Houston, MD;  [facility];  Service: Endoscopy;  Laterality: N/A;  830 - moved to 10:20 - Ann to notify pt  . FLEXIBLE SIGMOIDOSCOPY N/A 08/11/2015   Procedure: FLEXIBLE SIGMOIDOSCOPY;  Surgeon: Rogene Houston, MD;  [facility];  Service: Endoscopy;  Laterality: N/A;  730  . FLEXIBLE SIGMOIDOSCOPY N/A 08/13/2016   Procedure: FLEXIBLE SIGMOIDOSCOPY;  Surgeon: Rogene Houston, MD;  [facility];  Service: Endoscopy;  Laterality: N/A;  730  . HEMICOLECTOMY  2001   right  . HEMICOLECTOMY  2002   left  . HOLMIUM LASER APPLICATION Left 08/12/7822   Procedure: HOLMIUM LASER LITHOTRIPSY,;  Surgeon: Franchot Gallo, MD;  Location: [facility];  Service: Urology;  Laterality: Left;  . kidney resection  2007   left partial  . LAPAROSCOPIC ASSISTED VAGINAL HYSTERECTOMY  1993   W/ BILATERAL SALPINGO-OOPHORECTOMY  . LEFT FLANK EXPLORATION W/  PARTIAL LEFT NEPHRECTOMY/ LEFT HILAR LYMPH NODE BX  04-17-2005  DR DAHLSTEDT   RENAL CELL CARCINOMA  . LITHOTRIPSY  11-22013   x2  . NEPHROLITHOTOMY Left 06/09/2014   Procedure: NEPHROLITHOTOMY PERCUTANEOUS;  Surgeon: Franchot Gallo, MD;  Location: [facility];  Service: Urology;  Laterality: Left;  with STENT  .  RIGHT ROTATOR  CUFF REPAIR/   BICEP REPAIR  10-17-2011  DR SUPPLE  . SUBTOTAL COLECTOMY  2007  . URETEROSCOPY WITH HOLMIUM LASER LITHOTRIPSY Left 07/14/2014   Procedure: URETEROSCOPY  EXTRACTION OF STONES;  Surgeon: Franchot Gallo, MD;  Location: [facility];  Service: Urology;  Laterality: Left;    Review of Systems  Constitutional: Positive for malaise/fatigue (On occasion). Negative for chills and fever.       Appetite is good.  HENT: Negative.   Eyes: Negative.   Respiratory: Negative.  Negative for sputum production, shortness of breath and wheezing.   Cardiovascular: Positive for leg swelling (occasional ). Negative  for chest pain and palpitations.  Gastrointestinal: Positive for diarrhea (Occasional diarrhea.  Relieved with Lomotil). Negative for blood in stool, nausea and vomiting.  Genitourinary: Positive for flank pain (On occasion; history of frequent kidney stones).  Musculoskeletal: Positive for joint pain (left hand).  Skin: Negative.  Negative for rash.  Neurological: Positive for dizziness (States it is from positional vertigo). Negative for sensory change, speech change, weakness and headaches.  Endo/Heme/Allergies: Negative.   Psychiatric/Behavioral: Negative.  Negative for depression.  All other systems reviewed and are negative. 14 point review of systems was performed and is negative except as detailed under history of present illness and above     PHYSICAL EXAMINATION  ECOG PERFORMANCE STATUS: 0 - Asymptomatic  BP (!) 163/91 (BP Location: Left Arm, Patient Position: Sitting)   Pulse 79   Temp 98.5 F (36.9 C) (Oral)   Resp 18   Ht 5' 0.5" (1.537 m)   Wt 201 lb 6.4 oz (91.4 kg)   SpO2 98%   BMI 38.69 kg/m    Physical Exam  Constitutional: She is oriented to person, place, and time and well-developed, well-nourished, and in no distress.  HENT:  Head: Normocephalic and atraumatic.  Nose: Nose normal.  Mouth/Throat: Oropharynx is clear and moist. No oropharyngeal exudate.  Eyes: Conjunctivae and EOM are normal. Pupils are equal, round, and reactive to light. Right eye exhibits no discharge. Left eye exhibits no discharge. No scleral icterus.  Neck: Normal range of motion. Neck supple. No tracheal deviation present. No thyromegaly present.  Cardiovascular: Normal rate, regular rhythm, normal heart sounds and intact distal pulses. Exam reveals no gallop and no friction rub.  No murmur heard. Pulmonary/Chest: Effort normal and breath sounds normal. She has no wheezes. She has no rales.  Abdominal: Soft. Bowel sounds are normal. She exhibits no distension and no mass. There is no  tenderness. There is no rebound and no guarding.  Musculoskeletal: Normal range of motion. She exhibits edema.  Bilateral lower extremities nonpitting  Lymphadenopathy:    She has no cervical adenopathy.  Neurological: She is alert and oriented to person, place, and time. She has normal reflexes. No cranial nerve deficit. Gait normal. Coordination normal.  Skin: Skin is warm and dry. No rash noted.  Psychiatric: Mood, memory, affect and judgment normal.  Nursing note and vitals reviewed.   LABORATORY DATA: Labs were not drawn prior to visit.  Will call patient with results.  CBC    Component Value Date/Time   WBC 8.7 03/31/2017 1215   RBC 4.32 03/31/2017 1215   HGB 12.9 03/31/2017 1215   HCT 39.4 03/31/2017 1215   PLT 177 03/31/2017 1215   MCV 91.2 03/31/2017 1215   MCH 29.9 03/31/2017 1215   MCHC 32.7 03/31/2017 1215   RDW 13.0 03/31/2017 1215   LYMPHSABS 1.9 03/31/2017 1215  MONOABS 0.9 03/31/2017 1215   EOSABS 0.3 03/31/2017 1215   BASOSABS 0.1 03/31/2017 1215      Chemistry      Component Value Date/Time   NA 145 08/16/2016 1229   K 4.1 08/16/2016 1229   CL 111 08/16/2016 1229   CO2 25 08/16/2016 1229   BUN 24 (H) 08/16/2016 1229   CREATININE 0.80 03/25/2017 1109      Component Value Date/Time   CALCIUM 9.6 08/16/2016 1229   ALKPHOS 67 08/16/2016 1229   AST 21 08/16/2016 1229   ALT 23 08/16/2016 1229   BILITOT 0.6 08/16/2016 1229       RADIOGRAPHIC STUDIES: I have personally reviewed the radiological images as listed and agreed with the findings in the report.   CLINICAL DATA:  Followup lymphoma and renal cell carcinoma.  EXAM: CT ABDOMEN AND PELVIS WITH CONTRAST  TECHNIQUE: Multidetector CT imaging of the abdomen and pelvis was performed using the standard protocol following bolus administration of intravenous contrast.  CONTRAST:  145m ISOVUE-300 IOPAMIDOL (ISOVUE-300) INJECTION 61%  COMPARISON:  03/21/2016  FINDINGS: Lower chest: No  acute abnormality.  Hepatobiliary: Hepatic steatosis noted. No focal liver abnormality. 6 mm gallstone identified. No gallbladder wall thickening or pericholecystic fluid.  Pancreas: Unremarkable. No pancreatic ductal dilatation or surrounding inflammatory changes.  Spleen: Normal in size without focal abnormality.  Adrenals/Urinary Tract: The adrenal glands are normal.  Postsurgical changes from partial nephrectomy of the left kidney appears stable from previous exam. There are several nonobstructing calculi within the left renal collecting system. The largest is in the inferior pole of the left kidney measuring 10 x 4 mm, image 32 of series 2. No suspicious kidney lesions identified. Unremarkable appearance of the right kidney. Urinary bladder appears normal.  Stomach/Bowel: The stomach appears normal. Postsurgical changes from subtotal colectomy identified. There is no pathologic dilatation of the bowel loops.  Vascular/Lymphatic: Normal appearance of the abdominal aorta.  Retrocaval node measures 1.5 cm, image 33 of series 2. Previously 1.9 cm. Periaortic node measures 1.1 cm, image 36 of series 2. Previously 1.1 cm. Pre aortic node measures 0.9 cm, image 31 of series 2. Previously 1.1 cm. No pelvic or inguinal adenopathy.  Reproductive: Status post hysterectomy. No adnexal masses.  Other: No abdominal wall hernia or abnormality. No abdominopelvic ascites.  Musculoskeletal: Degenerative disc disease noted within the lumbar spine.  IMPRESSION: 1. No significant change in the appearance of retroperitoneal adenopathy. 2. Status post partial left nephrectomy and subtotal colectomy without evidence to suggest recurrent disease or metastasis. 3. Nonobstructing left renal calculi 4. Gallstones   Electronically Signed   By: TKerby MoorsM.D.   On: 03/25/2017 14:22  ASSESSMENT AND PLAN:  Lynch Syndrome [facility]  demonstrating MLH1 mutation [id number] an 82% risk of colorectal cancer, 60% risk of endometrial cancer (by age 67, 13% risk of gastric cancer, 12 % risk of ovarian cancer (by age 67, and first degree relatives have a 1 in 2 chance of having this mutation. S/P hysterectomy with bilateral salpingo-oophorectomy in 1993 by Dr. WHeide Spark AND H/O Follicular-type NHL, well-differentiated with CD20 positivity, grade 1 found at the time of sigmoid colon resection in 2007 with 2 involved lymph nodes. AND Colon cancer x 3. S/P [facility]. She will undergo flexible sigmoidoscopy annually by Dr. RLaural Goldenand we will follow CEAs AND Renal cancer, S/P left partial nephrectomy by Dr. DBeatrix Fetters  Dispo: She is currently doing very well.  We reviewed her CT scans in detail.  They continue to be stable.  Results are noted as above.  Per Dr. Gladys Damme last note, we will continue surveillance given her Lynch syndrome with labs every 6 months and annual CT abdomen/pelvis imaging.  Unfortunately labs were not placed prior to her visit so she will get labs prior to leaving today and we will call with results.  I have also scheduled her screening mammogram for March 2019.  She will continue monthly B12 injections here at the cancer center.  I have scheduled her annual CT abdomen/pelvis for next year and standing labs every 6 months.  Patient will be following up with PCP in the next several weeks.   Orders Placed This Encounter  Procedures  . MM SCREENING BREAST TOMO BILATERAL    Standing Status:   Future    Standing Expiration Date:   05/30/2018    Order Specific Question:   Reason for Exam (SYMPTOM  OR DIAGNOSIS REQUIRED)    Answer:   Annual screening    Order Specific Question:   Preferred imaging location?    Answer:   [facility]  . CT Abdomen Pelvis W Contrast    Standing Status:   Future    Standing Expiration Date:    09/29/2018    Order Specific Question:   If indicated for the ordered procedure, I authorize the administration of contrast media per Radiology protocol    Answer:   Yes    Order Specific Question:   Preferred imaging location?    Answer:   [facility]    Order Specific Question:   Radiology Contrast Protocol - do NOT remove file path    Answer:   [email or web address]  . CEA    Standing Status:   Future    Standing Expiration Date:   03/31/2018  . Lactate dehydrogenase    Standing Status:   Future    Standing Expiration Date:   03/31/2018  . Comprehensive metabolic panel  . CBC with Differential/Platelet  . Ferritin    Standing Status:   Future    Standing Expiration Date:   03/31/2018  . Iron and TIBC    Standing Status:   Future    Standing Expiration Date:   03/31/2018  . Folate    Standing Status:   Future    Standing Expiration Date:   03/31/2018  . Vitamin B12    Standing Status:   Future    Standing Expiration Date:   03/31/2018  . CEA    Standing Status:   Standing    Number of Occurrences:   5    Standing Expiration Date:   03/31/2020  . Lactate dehydrogenase    Standing Status:   Standing    Number of Occurrences:   5    Standing Expiration Date:   03/31/2020  . Comprehensive metabolic panel    Standing Status:   Standing    Number of Occurrences:   5    Standing Expiration Date:   03/31/2020  . CBC with Differential/Platelet    Standing Status:   Standing    Number of Occurrences:   5    Standing Expiration Date:   03/31/2020  . Ferritin    Standing Status:   Standing    Number of Occurrences:   5    Standing Expiration Date:   03/31/2020  . Iron and TIBC    Standing Status:  Standing    Number of Occurrences:   5    Standing Expiration Date:   03/31/2020  . Folate    Standing Status:   Standing    Number of Occurrences:   5    Standing Expiration Date:   03/31/2020  . Vitamin B12    Standing Status:   Standing    Number of  Occurrences:   5    Standing Expiration Date:   03/31/2020   All questions were answered. The patient knows to call the clinic with any problems, questions or concerns. We can certainly see the patient much sooner if necessary.  Greater than 50% was spent in counseling and coordination of care with this patient including but not limited to discussion of the relevant topics above (See A&P) including, but not limited to diagnosis and management of acute and chronic medical conditions.   Jacquelin Hawking, NP  03/31/2017

## 2017-04-01 LAB — CEA: CEA: 2.9 ng/mL (ref 0.0–4.7)

## 2017-04-02 ENCOUNTER — Telehealth (HOSPITAL_COMMUNITY): Payer: Self-pay | Admitting: Oncology

## 2017-04-02 NOTE — Telephone Encounter (Signed)
Called patient with lab results.  All labs look stable.  All questions answered.   CBC Latest Ref Rng & Units 03/31/2017 08/16/2016 03/24/2015  WBC 4.0 - 10.5 K/uL 8.7 10.4 7.8  Hemoglobin 12.0 - 15.0 g/dL 12.9 13.1 13.6  Hematocrit 36.0 - 46.0 % 39.4 39.9 40.7  Platelets 150 - 400 K/uL 177 186 185   LDH 142 Folate 15.2 Viatmin B12 294  Iron/TIBC/Ferritin/ %Sat    Component Value Date/Time   IRON 99 03/31/2017 1215   TIBC 435 03/31/2017 1215   FERRITIN 82 03/31/2017 1215   IRONPCTSAT 23 03/31/2017 1215   CEA 2.9  Faythe Casa, NP 04/02/2017 10:48 AM

## 2017-04-02 NOTE — Progress Notes (Signed)
Spoke to patient and gave her detailed summary of her lab results that were drawn after visit.  They all look good and stable. Patient's questions answered.

## 2017-04-21 ENCOUNTER — Encounter (HOSPITAL_COMMUNITY): Payer: Self-pay

## 2017-04-21 ENCOUNTER — Other Ambulatory Visit: Payer: Self-pay

## 2017-04-21 ENCOUNTER — Inpatient Hospital Stay (HOSPITAL_COMMUNITY): Payer: PPO | Attending: Oncology

## 2017-04-21 ENCOUNTER — Ambulatory Visit (HOSPITAL_COMMUNITY): Payer: PPO

## 2017-04-21 VITALS — BP 167/75 | HR 88 | Temp 97.7°F | Resp 18

## 2017-04-21 DIAGNOSIS — D538 Other specified nutritional anemias: Secondary | ICD-10-CM | POA: Insufficient documentation

## 2017-04-21 DIAGNOSIS — Z79899 Other long term (current) drug therapy: Secondary | ICD-10-CM | POA: Diagnosis not present

## 2017-04-21 DIAGNOSIS — E538 Deficiency of other specified B group vitamins: Secondary | ICD-10-CM

## 2017-04-21 MED ORDER — CYANOCOBALAMIN 1000 MCG/ML IJ SOLN
1000.0000 ug | Freq: Once | INTRAMUSCULAR | Status: AC
  Administered 2017-04-21: 1000 ug via INTRAMUSCULAR

## 2017-04-21 NOTE — Progress Notes (Signed)
Nicole [name] presents today for injection per MD orders. B12 1000 mcg administered IM in left deltoid. Administration without incident. Patient tolerated well. Patient tolerated treatment without incidence. Patient discharged ambulatory and in stable condition from clinic. Patient to follow up as scheduled.

## 2017-04-21 NOTE — Patient Instructions (Signed)
[place] at [facility] Discharge Instructions  RECOMMENDATIONS MADE BY THE CONSULTANT AND ANY TEST RESULTS WILL BE SENT TO YOUR REFERRING PHYSICIAN.  You had your B-12 injection today Follow up next month as scheduled.  Thank you for choosing [place] at [facility] to provide your oncology and hematology care.  To afford each patient quality time with our provider, please arrive at least 15 minutes before your scheduled appointment time.    If you have a lab appointment with the [facility] please come in thru the  Main Entrance and check in at the main information desk  You need to re-schedule your appointment should you arrive 10 or more minutes late.  We strive to give you quality time with our providers, and arriving late affects you and other patients whose appointments are after yours.  Also, if you no show three or more times for appointments you may be dismissed from the clinic at the providers discretion.     Again, thank you for choosing [facility].  Our hope is that these requests will decrease the amount of time that you wait before being seen by our physicians.       _____________________________________________________________  Should you have questions after your visit to [facility], please contact our office at [phone] between the hours of 8:30 a.m. and 4:30 p.m.  Voicemails left after 4:30 p.m. will not be returned until the following business day.  For prescription refill requests, have your pharmacy contact our office.       Resources For Cancer Patients and their Caregivers ? American Cancer Society: Can assist with transportation, wigs, general needs, runs Look Good Feel Better.        925-722-0898 ? Cancer Care: Provides financial assistance, online support groups, medication/co-pay assistance.  1-800-813-HOPE 4076575898) ? [place] Assists [place]  cancer patients and their families through emotional , educational and financial support.  (236) 794-7250 ? [place] DSS Where to apply for food stamps, Medicaid and utility assistance. 602 291 9073 ? RCATS: Transportation to medical appointments. 548-858-4923 ? Social Security Administration: May apply for disability if have a Stage IV cancer. 219-708-0643 (385)037-4321 ? [employer], Disability and Transit Services: Assists with nutrition, care and transit needs. [place] Support Programs: @10RELATIVEDAYS @ > Cancer Support Group  2nd Tuesday of the month 1pm-2pm, Journey Room  > Creative Journey  3rd Tuesday of the month 1130am-1pm, Journey Room  > Look Good Feel Better  1st Wednesday of the month 10am-12 noon, Journey Room (Call [place] to register [phone])

## 2017-05-19 ENCOUNTER — Encounter (HOSPITAL_COMMUNITY): Payer: Self-pay

## 2017-05-19 ENCOUNTER — Inpatient Hospital Stay (HOSPITAL_COMMUNITY): Payer: PPO | Attending: Oncology

## 2017-05-19 VITALS — BP 149/80 | HR 77 | Temp 98.1°F | Resp 18

## 2017-05-19 DIAGNOSIS — E538 Deficiency of other specified B group vitamins: Secondary | ICD-10-CM | POA: Diagnosis not present

## 2017-05-19 DIAGNOSIS — Z79899 Other long term (current) drug therapy: Secondary | ICD-10-CM | POA: Diagnosis not present

## 2017-05-19 MED ORDER — CYANOCOBALAMIN 1000 MCG/ML IJ SOLN
1000.0000 ug | Freq: Once | INTRAMUSCULAR | Status: AC
  Administered 2017-05-19: 1000 ug via INTRAMUSCULAR

## 2017-05-19 MED ORDER — CYANOCOBALAMIN 1000 MCG/ML IJ SOLN
INTRAMUSCULAR | Status: AC
  Filled 2017-05-19: qty 1

## 2017-05-19 NOTE — Patient Instructions (Signed)
[facility] at [facility] Discharge Instructions  Received Vit B12 injection today. Follow-up as scheduled. Call clinic for any questions or concerns   Thank you for choosing [facility] at [facility] to provide your oncology and hematology care.  To afford each patient quality time with our provider, please arrive at least 15 minutes before your scheduled appointment time.   If you have a lab appointment with the Cancer Center please come in thru the  Main Entrance and check in at the main information desk  You need to re-schedule your appointment should you arrive 10 or more minutes late.  We strive to give you quality time with our providers, and arriving late affects you and other patients whose appointments are after yours.  Also, if you no show three or more times for appointments you may be dismissed from the clinic at the providers discretion.     Again, thank you for choosing [facility].  Our hope is that these requests will decrease the amount of time that you wait before being seen by our physicians.       _____________________________________________________________  Should you have questions after your visit to [facility], please contact our office at [phone] between the hours of 8:30 a.m. and 4:30 p.m.  Voicemails left after 4:30 p.m. will not be returned until the following business day.  For prescription refill requests, have your pharmacy contact our office.       Resources For Cancer Patients and their Caregivers ? American Cancer Society: Can assist with transportation, wigs, general needs, runs Look Good Feel Better.        1-888-227-6333 ? Cancer Care: Provides financial assistance, online support groups, medication/co-pay assistance.  1-800-813-HOPE (4673) ? [facility] Assists [place] cancer patients and their families through emotional , educational and  financial support.  336-427-4357 ? [place] DSS Where to apply for food stamps, Medicaid and utility assistance. 336-342-1394 ? RCATS: Transportation to medical appointments. 336-347-2287 ? Social Security Administration: May apply for disability if have a Stage IV cancer. 336-342-7796 1-800-772-1213 ? [place] Aging, Disability and Transit Services: Assists with nutrition, care and transit needs. 336-349-2343  Cancer Center Support Programs:   > Cancer Support Group  2nd Tuesday of the month 1pm-2pm, Journey Room   > Creative Journey  3rd Tuesday of the month 1130am-1pm, Journey Room    

## 2017-05-19 NOTE — Progress Notes (Signed)
Nicole [name] tolerated Vit B12 injection well without complaints or incident. VSS Pt discharged self ambulatory in satisfactory condition 

## 2017-05-20 ENCOUNTER — Other Ambulatory Visit (HOSPITAL_COMMUNITY): Payer: Self-pay | Admitting: Oncology

## 2017-05-20 DIAGNOSIS — Z1231 Encounter for screening mammogram for malignant neoplasm of breast: Secondary | ICD-10-CM

## 2017-05-26 ENCOUNTER — Encounter (HOSPITAL_COMMUNITY): Payer: Self-pay

## 2017-05-26 ENCOUNTER — Ambulatory Visit (HOSPITAL_COMMUNITY)
Admission: RE | Admit: 2017-05-26 | Discharge: 2017-05-26 | Disposition: A | Discharge status: Home / Self Care | Payer: PPO | Source: Ambulatory Visit | Attending: Oncology | Admitting: Oncology

## 2017-05-26 DIAGNOSIS — Z1231 Encounter for screening mammogram for malignant neoplasm of breast: Secondary | ICD-10-CM | POA: Diagnosis not present

## 2017-06-19 ENCOUNTER — Inpatient Hospital Stay (HOSPITAL_COMMUNITY): Payer: PPO | Attending: Oncology

## 2017-06-19 ENCOUNTER — Encounter (HOSPITAL_COMMUNITY): Payer: Self-pay

## 2017-06-19 VITALS — BP 125/78 | HR 93 | Temp 98.2°F | Resp 18

## 2017-06-19 DIAGNOSIS — Z79899 Other long term (current) drug therapy: Secondary | ICD-10-CM | POA: Insufficient documentation

## 2017-06-19 DIAGNOSIS — E538 Deficiency of other specified B group vitamins: Secondary | ICD-10-CM

## 2017-06-19 MED ORDER — CYANOCOBALAMIN 1000 MCG/ML IJ SOLN
INTRAMUSCULAR | Status: AC
  Filled 2017-06-19: qty 1

## 2017-06-19 MED ORDER — CYANOCOBALAMIN 1000 MCG/ML IJ SOLN
1000.0000 ug | Freq: Once | INTRAMUSCULAR | Status: AC
  Administered 2017-06-19: 1000 ug via INTRAMUSCULAR

## 2017-06-19 NOTE — Patient Instructions (Signed)
[facility] at [facility]  Discharge Instructions:  You received a b12 shot today.  _______________________________________________________________  Thank you for choosing [facility] at [facility] to provide your oncology and hematology care.  To afford each patient quality time with our providers, please arrive at least 15 minutes before your scheduled appointment.  You need to re-schedule your appointment if you arrive 10 or more minutes late.  We strive to give you quality time with our providers, and arriving late affects you and other patients whose appointments are after yours.  Also, if you no show three or more times for appointments you may be dismissed from the clinic.  Again, thank you for choosing [facility] at [facility]. Our hope is that these requests will allow you access to exceptional care and in a timely manner. _______________________________________________________________  If you have questions after your visit, please contact our office at [phone] between the hours of 8:30 a.m. and 5:00 p.m. Voicemails left after 4:30 p.m. will not be returned until the following business day. _______________________________________________________________  For prescription refill requests, have your pharmacy contact our office. _______________________________________________________________  Recommendations made by the consultant and any test results will be sent to your referring physician. _______________________________________________________________ 

## 2017-07-18 ENCOUNTER — Encounter (HOSPITAL_COMMUNITY): Payer: Self-pay

## 2017-07-18 ENCOUNTER — Other Ambulatory Visit: Payer: Self-pay

## 2017-07-18 ENCOUNTER — Inpatient Hospital Stay (HOSPITAL_COMMUNITY): Payer: PPO | Attending: Oncology

## 2017-07-18 ENCOUNTER — Ambulatory Visit (HOSPITAL_COMMUNITY): Payer: PPO

## 2017-07-18 VITALS — BP 147/67 | HR 78 | Temp 98.3°F | Resp 16

## 2017-07-18 DIAGNOSIS — Z79899 Other long term (current) drug therapy: Secondary | ICD-10-CM | POA: Insufficient documentation

## 2017-07-18 DIAGNOSIS — E538 Deficiency of other specified B group vitamins: Secondary | ICD-10-CM | POA: Diagnosis not present

## 2017-07-18 MED ORDER — CYANOCOBALAMIN 1000 MCG/ML IJ SOLN
INTRAMUSCULAR | Status: AC
  Filled 2017-07-18: qty 1

## 2017-07-18 MED ORDER — CYANOCOBALAMIN 1000 MCG/ML IJ SOLN
1000.0000 ug | Freq: Once | INTRAMUSCULAR | Status: AC
  Administered 2017-07-18: 1000 ug via INTRAMUSCULAR

## 2017-07-18 NOTE — Patient Instructions (Signed)
[place] at [facility] Discharge Instructions  You were given your monthly B12 injection. Return in 1 month for your next B12 injection.    Thank you for choosing [place] at [facility] to provide your oncology and hematology care.  To afford each patient quality time with our provider, please arrive at least 15 minutes before your scheduled appointment time.   If you have a lab appointment with the [facility] please come in thru the  Main Entrance and check in at the main information desk  You need to re-schedule your appointment should you arrive 10 or more minutes late.  We strive to give you quality time with our providers, and arriving late affects you and other patients whose appointments are after yours.  Also, if you no show three or more times for appointments you may be dismissed from the clinic at the providers discretion.     Again, thank you for choosing [facility].  Our hope is that these requests will decrease the amount of time that you wait before being seen by our physicians.       _____________________________________________________________  Should you have questions after your visit to [facility], please contact our office at [phone] between the hours of 8:30 a.m. and 4:30 p.m.  Voicemails left after 4:30 p.m. will not be returned until the following business day.  For prescription refill requests, have your pharmacy contact our office.       Resources For Cancer Patients and their Caregivers ? American Cancer Society: Can assist with transportation, wigs, general needs, runs Look Good Feel Better.        5180789870 ? Cancer Care: Provides financial assistance, online support groups, medication/co-pay assistance.  1-800-813-HOPE 541 259 4737) ? [place] Assists [place] cancer patients and their families through emotional , educational and financial  support.  858-043-3978 ? [place] DSS Where to apply for food stamps, Medicaid and utility assistance. 646 676 8840 ? RCATS: Transportation to medical appointments. 920-268-8462 ? Social Security Administration: May apply for disability if have a Stage IV cancer. 437-607-9663 718-551-7779 ? [employer], Disability and Transit Services: Assists with nutrition, care and transit needs. [place] Support Programs:   > Cancer Support Group  2nd Tuesday of the month 1pm-2pm, Journey Room   > Creative Journey  3rd Tuesday of the month 1130am-1pm, Journey Room

## 2017-07-18 NOTE — Progress Notes (Signed)
Pt here today for B12 injection. Pt given B12 injection in right deltoid. Pt tolerated well. Pt stable and discharged home ambulatory. Pt to return in 1 month for next B12 injection.

## 2017-08-18 ENCOUNTER — Inpatient Hospital Stay (HOSPITAL_COMMUNITY): Payer: PPO | Attending: Oncology

## 2017-08-18 ENCOUNTER — Ambulatory Visit (HOSPITAL_COMMUNITY): Payer: PPO

## 2017-08-18 ENCOUNTER — Encounter (HOSPITAL_COMMUNITY): Payer: Self-pay

## 2017-08-18 VITALS — BP 160/85 | HR 82 | Resp 18

## 2017-08-18 DIAGNOSIS — Z79899 Other long term (current) drug therapy: Secondary | ICD-10-CM | POA: Insufficient documentation

## 2017-08-18 DIAGNOSIS — E538 Deficiency of other specified B group vitamins: Secondary | ICD-10-CM | POA: Insufficient documentation

## 2017-08-18 MED ORDER — CYANOCOBALAMIN 1000 MCG/ML IJ SOLN
1000.0000 ug | Freq: Once | INTRAMUSCULAR | Status: AC
  Administered 2017-08-18: 1000 ug via INTRAMUSCULAR

## 2017-08-18 MED ORDER — CYANOCOBALAMIN 1000 MCG/ML IJ SOLN
INTRAMUSCULAR | Status: AC
  Filled 2017-08-18: qty 1

## 2017-08-18 NOTE — Patient Instructions (Signed)
[facility] at [facility]  Discharge Instructions:  You received a b12 shot today.  _______________________________________________________________  Thank you for choosing [facility] at [facility] to provide your oncology and hematology care.  To afford each patient quality time with our providers, please arrive at least 15 minutes before your scheduled appointment.  You need to re-schedule your appointment if you arrive 10 or more minutes late.  We strive to give you quality time with our providers, and arriving late affects you and other patients whose appointments are after yours.  Also, if you no show three or more times for appointments you may be dismissed from the clinic.  Again, thank you for choosing [facility] at [facility]. Our hope is that these requests will allow you access to exceptional care and in a timely manner. _______________________________________________________________  If you have questions after your visit, please contact our office at [phone] between the hours of 8:30 a.m. and 5:00 p.m. Voicemails left after 4:30 p.m. will not be returned until the following business day. _______________________________________________________________  For prescription refill requests, have your pharmacy contact our office. _______________________________________________________________  Recommendations made by the consultant and any test results will be sent to your referring physician. _______________________________________________________________ 

## 2017-08-18 NOTE — Progress Notes (Signed)
Patient tolerated injection with no complaints voiced. Injection site clean and dry with no bruising or swelling noted at site.  Band aid applied.  VSS with discharge and left ambulatory with no s/s of distress noted.  

## 2017-09-15 ENCOUNTER — Other Ambulatory Visit (HOSPITAL_COMMUNITY): Payer: Self-pay | Admitting: *Deleted

## 2017-09-15 DIAGNOSIS — Z1509 Genetic susceptibility to other malignant neoplasm: Secondary | ICD-10-CM

## 2017-09-15 DIAGNOSIS — E538 Deficiency of other specified B group vitamins: Secondary | ICD-10-CM

## 2017-09-17 ENCOUNTER — Inpatient Hospital Stay (HOSPITAL_COMMUNITY): Payer: PPO

## 2017-09-17 ENCOUNTER — Inpatient Hospital Stay (HOSPITAL_COMMUNITY): Payer: PPO | Attending: Hematology

## 2017-09-17 ENCOUNTER — Encounter (HOSPITAL_COMMUNITY): Payer: Self-pay

## 2017-09-17 ENCOUNTER — Ambulatory Visit (HOSPITAL_COMMUNITY): Payer: PPO

## 2017-09-17 ENCOUNTER — Other Ambulatory Visit (HOSPITAL_COMMUNITY): Payer: PPO

## 2017-09-17 VITALS — BP 144/86 | HR 78 | Temp 98.0°F | Resp 18

## 2017-09-17 DIAGNOSIS — E538 Deficiency of other specified B group vitamins: Secondary | ICD-10-CM

## 2017-09-17 DIAGNOSIS — Z1509 Genetic susceptibility to other malignant neoplasm: Secondary | ICD-10-CM

## 2017-09-17 DIAGNOSIS — Z79899 Other long term (current) drug therapy: Secondary | ICD-10-CM | POA: Insufficient documentation

## 2017-09-17 LAB — IRON AND TIBC
Iron: 83 ug/dL (ref 28–170)
SATURATION RATIOS: 19 % (ref 10.4–31.8)
TIBC: 427 ug/dL (ref 250–450)
UIBC: 344 ug/dL

## 2017-09-17 LAB — CBC WITH DIFFERENTIAL/PLATELET
BASOS ABS: 0.1 10*3/uL (ref 0.0–0.1)
Basophils Relative: 1 %
EOS ABS: 0.3 10*3/uL (ref 0.0–0.7)
EOS PCT: 4 %
HCT: 42.6 % (ref 36.0–46.0)
Hemoglobin: 13.8 g/dL (ref 12.0–15.0)
Lymphocytes Relative: 25 %
Lymphs Abs: 2 10*3/uL (ref 0.7–4.0)
MCH: 30.3 pg (ref 26.0–34.0)
MCHC: 32.4 g/dL (ref 30.0–36.0)
MCV: 93.6 fL (ref 78.0–100.0)
Monocytes Absolute: 0.6 10*3/uL (ref 0.1–1.0)
Monocytes Relative: 7 %
NEUTROS PCT: 63 %
Neutro Abs: 4.9 10*3/uL (ref 1.7–7.7)
PLATELETS: 180 10*3/uL (ref 150–400)
RBC: 4.55 MIL/uL (ref 3.87–5.11)
RDW: 13.1 % (ref 11.5–15.5)
WBC: 7.8 10*3/uL (ref 4.0–10.5)

## 2017-09-17 LAB — COMPREHENSIVE METABOLIC PANEL
ALT: 20 U/L (ref 0–44)
AST: 19 U/L (ref 15–41)
Albumin: 3.9 g/dL (ref 3.5–5.0)
Alkaline Phosphatase: 72 U/L (ref 38–126)
Anion gap: 10 (ref 5–15)
BUN: 16 mg/dL (ref 8–23)
CALCIUM: 9.2 mg/dL (ref 8.9–10.3)
CHLORIDE: 108 mmol/L (ref 98–111)
CO2: 25 mmol/L (ref 22–32)
Creatinine, Ser: 0.82 mg/dL (ref 0.44–1.00)
GFR calc non Af Amer: >60 mL/min (ref >60)
GLUCOSE: 109 mg/dL — AB (ref 70–99)
Potassium: 4.3 mmol/L (ref 3.5–5.1)
SODIUM: 143 mmol/L (ref 135–145)
TOTAL PROTEIN: 7.2 g/dL (ref 6.5–8.1)
Total Bilirubin: 0.5 mg/dL (ref 0.3–1.2)

## 2017-09-17 LAB — FERRITIN: Ferritin: 67 ng/mL (ref 11–307)

## 2017-09-17 LAB — FOLATE: FOLATE: 19 ng/mL (ref >5.9)

## 2017-09-17 LAB — VITAMIN B12: Vitamin B-12: 237 pg/mL (ref 180–914)

## 2017-09-17 LAB — LACTATE DEHYDROGENASE: LDH: 123 U/L (ref 98–192)

## 2017-09-17 MED ORDER — CYANOCOBALAMIN 1000 MCG/ML IJ SOLN
INTRAMUSCULAR | Status: AC
  Filled 2017-09-17: qty 1

## 2017-09-17 MED ORDER — CYANOCOBALAMIN 1000 MCG/ML IJ SOLN
1000.0000 ug | Freq: Once | INTRAMUSCULAR | Status: AC
  Administered 2017-09-17: 1000 ug via INTRAMUSCULAR

## 2017-09-17 NOTE — Patient Instructions (Signed)
[facility] at [facility]  Discharge Instructions:  You received a b12 shot today.  _______________________________________________________________  Thank you for choosing [facility] at [facility] to provide your oncology and hematology care.  To afford each patient quality time with our providers, please arrive at least 15 minutes before your scheduled appointment.  You need to re-schedule your appointment if you arrive 10 or more minutes late.  We strive to give you quality time with our providers, and arriving late affects you and other patients whose appointments are after yours.  Also, if you no show three or more times for appointments you may be dismissed from the clinic.  Again, thank you for choosing [facility]. Our hope is that these requests will allow you access to exceptional care and in a timely manner. _______________________________________________________________  If you have questions after your visit, please contact our office at [phone] between the hours of 8:30 a.m. and 5:00 p.m. Voicemails left after 4:30 p.m. will not be returned until the following business day. _______________________________________________________________  For prescription refill requests, have your pharmacy contact our office. _______________________________________________________________  Recommendations made by the consultant and any test results will be sent to your referring physician. _______________________________________________________________ 

## 2017-09-17 NOTE — Progress Notes (Signed)
Patient tolerated b12 shot with no complaints voiced.  Injection site clean and dry with no bruising or swelling noted.  Band aid applied.  VSS with discharge and left ambulatory with no s/s of distress noted.

## 2017-09-18 LAB — CEA: CEA: 3.3 ng/mL (ref 0.0–4.7)

## 2017-09-19 ENCOUNTER — Other Ambulatory Visit (HOSPITAL_COMMUNITY): Payer: PPO

## 2017-10-20 ENCOUNTER — Encounter (HOSPITAL_COMMUNITY): Payer: Self-pay

## 2017-10-20 ENCOUNTER — Other Ambulatory Visit: Payer: Self-pay

## 2017-10-20 ENCOUNTER — Inpatient Hospital Stay (HOSPITAL_COMMUNITY): Payer: PPO | Attending: Oncology

## 2017-10-20 ENCOUNTER — Ambulatory Visit (HOSPITAL_COMMUNITY): Payer: PPO

## 2017-10-20 VITALS — BP 142/74 | HR 80 | Temp 98.2°F | Resp 18

## 2017-10-20 DIAGNOSIS — E538 Deficiency of other specified B group vitamins: Secondary | ICD-10-CM

## 2017-10-20 DIAGNOSIS — Z79899 Other long term (current) drug therapy: Secondary | ICD-10-CM | POA: Diagnosis not present

## 2017-10-20 MED ORDER — CYANOCOBALAMIN 1000 MCG/ML IJ SOLN
INTRAMUSCULAR | Status: AC
  Filled 2017-10-20: qty 1

## 2017-10-20 MED ORDER — CYANOCOBALAMIN 1000 MCG/ML IJ SOLN
1000.0000 ug | Freq: Once | INTRAMUSCULAR | Status: AC
  Administered 2017-10-20: 1000 ug via INTRAMUSCULAR

## 2017-10-20 NOTE — Progress Notes (Signed)
Nicole [name] presents today for injection per MD orders. B12 1,046mcg administered SQ in left Upper Arm. Administration without incident. Patient tolerated well. Vitals stable and discharged home from clinic ambulatory. Follow up as scheduled.

## 2017-10-20 NOTE — Patient Instructions (Signed)
[place] [name] at [facility] Discharge Instructions  B12 injection today.  Follow up as scheduled.    Thank you for choosing [name] at [facility] to provide your oncology and hematology care.  To afford each patient quality time with our provider, please arrive at least 15 minutes before your scheduled appointment time.   If you have a lab appointment with the Cancer [name] please come in thru the  Main Entrance and check in at the main information desk  You need to re-schedule your appointment should you arrive 10 or more minutes late.  We strive to give you quality time with our providers, and arriving late affects you and other patients whose appointments are after yours.  Also, if you no show three or more times for appointments you may be dismissed from the clinic at the providers discretion.     Again, thank you for choosing [name].  Our hope is that these requests will decrease the amount of time that you wait before being seen by our physicians.       _____________________________________________________________  Should you have questions after your visit to [name], please contact our office at [phone] between the hours of 8:00 a.m. and 4:30 p.m.  Voicemails left after 4:00 p.m. will not be returned until the following business day.  For prescription refill requests, have your pharmacy contact our office and allow 72 hours.    [name] Support Programs:   > Cancer Support Group  2nd Tuesday of the month 1pm-2pm, Journey Room   

## 2017-11-18 ENCOUNTER — Other Ambulatory Visit (HOSPITAL_COMMUNITY): Payer: Self-pay | Admitting: Hematology

## 2017-11-20 ENCOUNTER — Inpatient Hospital Stay (HOSPITAL_COMMUNITY): Payer: PPO | Attending: Oncology

## 2017-11-20 ENCOUNTER — Encounter (HOSPITAL_COMMUNITY): Payer: Self-pay

## 2017-11-20 ENCOUNTER — Other Ambulatory Visit: Payer: Self-pay

## 2017-11-20 VITALS — BP 143/82 | HR 88 | Temp 98.0°F | Resp 18

## 2017-11-20 DIAGNOSIS — E538 Deficiency of other specified B group vitamins: Secondary | ICD-10-CM

## 2017-11-20 MED ORDER — CYANOCOBALAMIN 1000 MCG/ML IJ SOLN
1000.0000 ug | Freq: Once | INTRAMUSCULAR | Status: AC
  Administered 2017-11-20: 1000 ug via INTRAMUSCULAR

## 2017-11-20 MED ORDER — CYANOCOBALAMIN 1000 MCG/ML IJ SOLN
INTRAMUSCULAR | Status: AC
  Filled 2017-11-20: qty 1

## 2017-11-20 NOTE — Progress Notes (Signed)
Nicole [name] presents today for injection per the provider's orders.  B12 administration without incident; see MAR for injection details.  Patient tolerated procedure well and without incident.  No questions or complaints noted at this time.  Discharged ambulatory.

## 2017-12-22 ENCOUNTER — Inpatient Hospital Stay (HOSPITAL_COMMUNITY): Payer: PPO | Attending: Oncology

## 2017-12-22 ENCOUNTER — Ambulatory Visit (HOSPITAL_COMMUNITY): Payer: PPO

## 2017-12-22 VITALS — BP 131/71 | HR 83 | Temp 97.9°F | Resp 18

## 2017-12-22 DIAGNOSIS — E538 Deficiency of other specified B group vitamins: Secondary | ICD-10-CM | POA: Diagnosis not present

## 2017-12-22 DIAGNOSIS — Z79899 Other long term (current) drug therapy: Secondary | ICD-10-CM | POA: Insufficient documentation

## 2017-12-22 MED ORDER — CYANOCOBALAMIN 1000 MCG/ML IJ SOLN
1000.0000 ug | Freq: Once | INTRAMUSCULAR | Status: AC
  Administered 2017-12-22: 1000 ug via INTRAMUSCULAR

## 2017-12-22 MED ORDER — CYANOCOBALAMIN 1000 MCG/ML IJ SOLN
INTRAMUSCULAR | Status: AC
  Filled 2017-12-22: qty 1

## 2017-12-22 MED ORDER — FULVESTRANT 250 MG/5ML IM SOLN
INTRAMUSCULAR | Status: AC
  Filled 2017-12-22: qty 5

## 2017-12-22 NOTE — Progress Notes (Signed)
Nicole [name] tolerated B12 injection without incident or complaint. VSS. Pt discharged self ambulatory in satisfactory condition.

## 2017-12-22 NOTE — Patient Instructions (Signed)
[facility] Discharge Instructions for Patients Receiving Chemotherapy   Beginning January 23rd 2017 lab work for the [facility] will be done in the  Main lab at [facility] on 1st floor. If you have a lab appointment with the [facility] please come in thru the  Main Entrance and check in at the main information desk   Today you received the following: B12 injection. Follow up as scheduled. Call clinic for any questions or concerns.  To help prevent nausea and vomiting after your treatment, we encourage you to take your nausea medication   If you develop nausea and vomiting, or diarrhea that is not controlled by your medication, call the clinic.  The clinic phone number is [phone]. Office hours are Monday-Friday 8:30am-5:00pm.  BELOW ARE SYMPTOMS THAT SHOULD BE REPORTED IMMEDIATELY:  *FEVER GREATER THAN 101.0 F  *CHILLS WITH OR WITHOUT FEVER  NAUSEA AND VOMITING THAT IS NOT CONTROLLED WITH YOUR NAUSEA MEDICATION  *UNUSUAL SHORTNESS OF BREATH  *UNUSUAL BRUISING OR BLEEDING  TENDERNESS IN MOUTH AND THROAT WITH OR WITHOUT PRESENCE OF ULCERS  *URINARY PROBLEMS  *BOWEL PROBLEMS  UNUSUAL RASH Items with * indicate a potential emergency and should be followed up as soon as possible. If you have an emergency after office hours please contact your primary care physician or go to the nearest emergency department.  Please call the clinic during office hours if you have any questions or concerns.   You may also contact the Patient Navigator at [phone] should you have any questions or need assistance in obtaining follow up care.      Resources For Cancer Patients and their Caregivers ? American Cancer Society: Can assist with transportation, wigs, general needs, runs Look Good Feel Better.        ([phone] ? Cancer Care: Provides financial assistance, online support groups, medication/co-pay assistance.  1-800-813-HOPE (623)786-7040) ? [place] Assists [place] cancer patients and their families through emotional , educational and financial support.  973 354 4756 ? [place] DSS Where to apply for food stamps, Medicaid and utility assistance. (678)534-1794 ? RCATS: Transportation to medical appointments. [phone] ? Social Security Administration: May apply for disability if have a Stage IV cancer. [phone] ? [employer], Disability and Transit Services: Assists with nutrition, care and transit needs. [phone]

## 2018-01-22 ENCOUNTER — Ambulatory Visit (HOSPITAL_COMMUNITY): Payer: PPO

## 2018-01-22 ENCOUNTER — Inpatient Hospital Stay (HOSPITAL_COMMUNITY): Payer: PPO | Attending: Oncology

## 2018-01-22 ENCOUNTER — Encounter (HOSPITAL_COMMUNITY): Payer: Self-pay

## 2018-01-22 VITALS — BP 145/90 | HR 71 | Temp 97.8°F | Resp 18

## 2018-01-22 DIAGNOSIS — E538 Deficiency of other specified B group vitamins: Secondary | ICD-10-CM

## 2018-01-22 DIAGNOSIS — D538 Other specified nutritional anemias: Secondary | ICD-10-CM | POA: Diagnosis not present

## 2018-01-22 DIAGNOSIS — Z79899 Other long term (current) drug therapy: Secondary | ICD-10-CM | POA: Insufficient documentation

## 2018-01-22 MED ORDER — CYANOCOBALAMIN 1000 MCG/ML IJ SOLN
INTRAMUSCULAR | Status: AC
  Filled 2018-01-22: qty 1

## 2018-01-22 MED ORDER — FULVESTRANT 250 MG/5ML IM SOLN
INTRAMUSCULAR | Status: AC
  Filled 2018-01-22: qty 5

## 2018-01-22 MED ORDER — CYANOCOBALAMIN 1000 MCG/ML IJ SOLN
1000.0000 ug | Freq: Once | INTRAMUSCULAR | Status: AC
  Administered 2018-01-22: 1000 ug via INTRAMUSCULAR

## 2018-01-22 NOTE — Patient Instructions (Signed)
[facility] at [facility] Discharge Instructions  Received Vit B12 injection today. Follow-up as scheduled. Call clinic for any questions or concerns   Thank you for choosing [facility] at [facility] to provide your oncology and hematology care.  To afford each patient quality time with our provider, please arrive at least 15 minutes before your scheduled appointment time.   If you have a lab appointment with the Cancer Center please come in thru the  Main Entrance and check in at the main information desk  You need to re-schedule your appointment should you arrive 10 or more minutes late.  We strive to give you quality time with our providers, and arriving late affects you and other patients whose appointments are after yours.  Also, if you no show three or more times for appointments you may be dismissed from the clinic at the providers discretion.     Again, thank you for choosing [facility].  Our hope is that these requests will decrease the amount of time that you wait before being seen by our physicians.       _____________________________________________________________  Should you have questions after your visit to [facility], please contact our office at [phone] between the hours of 8:00 a.m. and 4:30 p.m.  Voicemails left after 4:00 p.m. will not be returned until the following business day.  For prescription refill requests, have your pharmacy contact our office and allow 72 hours.    Cancer Center Support Programs:   > Cancer Support Group  2nd Tuesday of the month 1pm-2pm, Journey Room   

## 2018-01-22 NOTE — Progress Notes (Signed)
Nicole [name] tolerated Vit B12 injection well without complaints or incident. VSS Pt discharged self ambulatory in satisfactory condition

## 2018-02-23 ENCOUNTER — Encounter (HOSPITAL_COMMUNITY): Payer: Self-pay

## 2018-02-23 ENCOUNTER — Inpatient Hospital Stay (HOSPITAL_COMMUNITY): Payer: PPO | Attending: Oncology

## 2018-02-23 ENCOUNTER — Other Ambulatory Visit: Payer: Self-pay

## 2018-02-23 VITALS — BP 151/76 | HR 74 | Temp 97.5°F | Resp 18

## 2018-02-23 DIAGNOSIS — Z9049 Acquired absence of other specified parts of digestive tract: Secondary | ICD-10-CM | POA: Diagnosis not present

## 2018-02-23 DIAGNOSIS — Z800 Family history of malignant neoplasm of digestive organs: Secondary | ICD-10-CM | POA: Diagnosis not present

## 2018-02-23 DIAGNOSIS — E538 Deficiency of other specified B group vitamins: Secondary | ICD-10-CM | POA: Diagnosis not present

## 2018-02-23 DIAGNOSIS — Z1501 Genetic susceptibility to malignant neoplasm of breast: Secondary | ICD-10-CM | POA: Diagnosis not present

## 2018-02-23 DIAGNOSIS — Z8553 Personal history of malignant neoplasm of renal pelvis: Secondary | ICD-10-CM | POA: Diagnosis not present

## 2018-02-23 DIAGNOSIS — Z8572 Personal history of non-Hodgkin lymphomas: Secondary | ICD-10-CM | POA: Diagnosis not present

## 2018-02-23 DIAGNOSIS — Z79899 Other long term (current) drug therapy: Secondary | ICD-10-CM | POA: Diagnosis not present

## 2018-02-23 MED ORDER — CYANOCOBALAMIN 1000 MCG/ML IJ SOLN
1000.0000 ug | Freq: Once | INTRAMUSCULAR | Status: AC
  Administered 2018-02-23: 1000 ug via INTRAMUSCULAR

## 2018-02-23 MED ORDER — CYANOCOBALAMIN 1000 MCG/ML IJ SOLN
INTRAMUSCULAR | Status: AC
  Filled 2018-02-23: qty 1

## 2018-02-23 NOTE — Progress Notes (Signed)
Nicole [name] presents today for injection per the provider's orders.  b12 administration without incident; see MAR for injection details.  Patient tolerated procedure well and without incident.  No questions or complaints noted at this time.  Discharged ambulatory.

## 2018-03-09 ENCOUNTER — Other Ambulatory Visit (HOSPITAL_COMMUNITY): Payer: Self-pay | Admitting: Urology

## 2018-03-09 ENCOUNTER — Ambulatory Visit (HOSPITAL_COMMUNITY)
Admission: RE | Admit: 2018-03-09 | Discharge: 2018-03-09 | Disposition: A | Discharge status: Home / Self Care | Payer: PPO | Source: Ambulatory Visit | Attending: Urology | Admitting: Urology

## 2018-03-09 DIAGNOSIS — N200 Calculus of kidney: Secondary | ICD-10-CM | POA: Insufficient documentation

## 2018-03-13 ENCOUNTER — Other Ambulatory Visit (HOSPITAL_COMMUNITY): Payer: Self-pay | Admitting: *Deleted

## 2018-03-13 DIAGNOSIS — E538 Deficiency of other specified B group vitamins: Secondary | ICD-10-CM

## 2018-03-13 DIAGNOSIS — Z85038 Personal history of other malignant neoplasm of large intestine: Secondary | ICD-10-CM

## 2018-03-13 DIAGNOSIS — D649 Anemia, unspecified: Secondary | ICD-10-CM

## 2018-03-16 ENCOUNTER — Inpatient Hospital Stay (HOSPITAL_COMMUNITY): Payer: PPO

## 2018-03-16 DIAGNOSIS — E538 Deficiency of other specified B group vitamins: Secondary | ICD-10-CM | POA: Diagnosis not present

## 2018-03-16 DIAGNOSIS — Z85038 Personal history of other malignant neoplasm of large intestine: Secondary | ICD-10-CM

## 2018-03-16 DIAGNOSIS — D649 Anemia, unspecified: Secondary | ICD-10-CM

## 2018-03-16 LAB — COMPREHENSIVE METABOLIC PANEL
ALK PHOS: 54 U/L (ref 38–126)
ALT: 16 U/L (ref 0–44)
AST: 16 U/L (ref 15–41)
Albumin: 3.6 g/dL (ref 3.5–5.0)
Anion gap: 7 (ref 5–15)
BILIRUBIN TOTAL: 0.6 mg/dL (ref 0.3–1.2)
BUN: 21 mg/dL (ref 8–23)
CO2: 24 mmol/L (ref 22–32)
Calcium: 8.6 mg/dL — ABNORMAL LOW (ref 8.9–10.3)
Chloride: 108 mmol/L (ref 98–111)
Creatinine, Ser: 0.75 mg/dL (ref 0.44–1.00)
GFR calc Af Amer: >60 mL/min (ref >60)
GFR calc non Af Amer: >60 mL/min (ref >60)
Glucose, Bld: 105 mg/dL — ABNORMAL HIGH (ref 70–99)
Potassium: 4.3 mmol/L (ref 3.5–5.1)
Sodium: 139 mmol/L (ref 135–145)
TOTAL PROTEIN: 6.4 g/dL — AB (ref 6.5–8.1)

## 2018-03-16 LAB — CBC WITH DIFFERENTIAL/PLATELET
Abs Immature Granulocytes: 0.04 10*3/uL (ref 0.00–0.07)
Basophils Absolute: 0.1 10*3/uL (ref 0.0–0.1)
Basophils Relative: 1 %
Eosinophils Absolute: 0.2 10*3/uL (ref 0.0–0.5)
Eosinophils Relative: 4 %
HCT: 40 % (ref 36.0–46.0)
HEMOGLOBIN: 12.6 g/dL (ref 12.0–15.0)
Immature Granulocytes: 1 %
LYMPHS ABS: 1.2 10*3/uL (ref 0.7–4.0)
Lymphocytes Relative: 19 %
MCH: 29.3 pg (ref 26.0–34.0)
MCHC: 31.5 g/dL (ref 30.0–36.0)
MCV: 93 fL (ref 80.0–100.0)
Monocytes Absolute: 0.6 10*3/uL (ref 0.1–1.0)
Monocytes Relative: 10 %
Neutro Abs: 4.3 10*3/uL (ref 1.7–7.7)
Neutrophils Relative %: 65 %
Platelets: 154 10*3/uL (ref 150–400)
RBC: 4.3 MIL/uL (ref 3.87–5.11)
RDW: 12.5 % (ref 11.5–15.5)
WBC: 6.5 10*3/uL (ref 4.0–10.5)
nRBC: 0 % (ref 0.0–0.2)

## 2018-03-16 LAB — IRON AND TIBC
Iron: 80 ug/dL (ref 28–170)
Saturation Ratios: 20 % (ref 10.4–31.8)
TIBC: 402 ug/dL (ref 250–450)
UIBC: 322 ug/dL

## 2018-03-16 LAB — LACTATE DEHYDROGENASE: LDH: 118 U/L (ref 98–192)

## 2018-03-16 LAB — FOLATE: FOLATE: 12.4 ng/mL (ref >5.9)

## 2018-03-16 LAB — VITAMIN B12: Vitamin B-12: 177 pg/mL — ABNORMAL LOW (ref 180–914)

## 2018-03-16 LAB — FERRITIN: FERRITIN: 56 ng/mL (ref 11–307)

## 2018-03-17 LAB — CEA: CEA: 2.6 ng/mL (ref 0.0–4.7)

## 2018-03-20 ENCOUNTER — Ambulatory Visit (HOSPITAL_COMMUNITY)
Admission: RE | Admit: 2018-03-20 | Discharge: 2018-03-20 | Disposition: A | Discharge status: Home / Self Care | Payer: PPO | Source: Ambulatory Visit | Attending: Oncology | Admitting: Oncology

## 2018-03-20 DIAGNOSIS — K8020 Calculus of gallbladder without cholecystitis without obstruction: Secondary | ICD-10-CM | POA: Diagnosis not present

## 2018-03-20 DIAGNOSIS — Z85038 Personal history of other malignant neoplasm of large intestine: Secondary | ICD-10-CM | POA: Insufficient documentation

## 2018-03-20 DIAGNOSIS — C642 Malignant neoplasm of left kidney, except renal pelvis: Secondary | ICD-10-CM | POA: Diagnosis not present

## 2018-03-20 MED ORDER — IOPAMIDOL (ISOVUE-300) INJECTION 61%
100.0000 mL | Freq: Once | INTRAVENOUS | Status: AC | PRN
  Administered 2018-03-20: 100 mL via INTRAVENOUS

## 2018-03-23 ENCOUNTER — Inpatient Hospital Stay (HOSPITAL_COMMUNITY): Payer: PPO | Attending: Oncology | Admitting: Hematology

## 2018-03-23 ENCOUNTER — Inpatient Hospital Stay (HOSPITAL_COMMUNITY): Payer: PPO

## 2018-03-23 ENCOUNTER — Other Ambulatory Visit (HOSPITAL_COMMUNITY): Payer: PPO

## 2018-03-23 ENCOUNTER — Other Ambulatory Visit: Payer: Self-pay

## 2018-03-23 ENCOUNTER — Encounter (HOSPITAL_COMMUNITY): Payer: Self-pay | Admitting: Hematology

## 2018-03-23 VITALS — BP 156/84 | HR 72 | Temp 98.0°F | Resp 16 | Wt 201.4 lb

## 2018-03-23 DIAGNOSIS — C8200 Follicular lymphoma grade I, unspecified site: Secondary | ICD-10-CM

## 2018-03-23 DIAGNOSIS — C642 Malignant neoplasm of left kidney, except renal pelvis: Secondary | ICD-10-CM

## 2018-03-23 DIAGNOSIS — Z933 Colostomy status: Secondary | ICD-10-CM | POA: Diagnosis not present

## 2018-03-23 DIAGNOSIS — R197 Diarrhea, unspecified: Secondary | ICD-10-CM

## 2018-03-23 DIAGNOSIS — Z85038 Personal history of other malignant neoplasm of large intestine: Secondary | ICD-10-CM | POA: Diagnosis not present

## 2018-03-23 DIAGNOSIS — R590 Localized enlarged lymph nodes: Secondary | ICD-10-CM | POA: Insufficient documentation

## 2018-03-23 DIAGNOSIS — Z1509 Genetic susceptibility to other malignant neoplasm: Secondary | ICD-10-CM | POA: Insufficient documentation

## 2018-03-23 DIAGNOSIS — Z905 Acquired absence of kidney: Secondary | ICD-10-CM | POA: Insufficient documentation

## 2018-03-23 DIAGNOSIS — E538 Deficiency of other specified B group vitamins: Secondary | ICD-10-CM

## 2018-03-23 DIAGNOSIS — Z87442 Personal history of urinary calculi: Secondary | ICD-10-CM | POA: Insufficient documentation

## 2018-03-23 DIAGNOSIS — C8290 Follicular lymphoma, unspecified, unspecified site: Secondary | ICD-10-CM | POA: Insufficient documentation

## 2018-03-23 DIAGNOSIS — Z8614 Personal history of Methicillin resistant Staphylococcus aureus infection: Secondary | ICD-10-CM | POA: Insufficient documentation

## 2018-03-23 DIAGNOSIS — Z90722 Acquired absence of ovaries, bilateral: Secondary | ICD-10-CM | POA: Insufficient documentation

## 2018-03-23 DIAGNOSIS — M129 Arthropathy, unspecified: Secondary | ICD-10-CM | POA: Insufficient documentation

## 2018-03-23 DIAGNOSIS — Z90710 Acquired absence of both cervix and uterus: Secondary | ICD-10-CM | POA: Insufficient documentation

## 2018-03-23 DIAGNOSIS — Z79899 Other long term (current) drug therapy: Secondary | ICD-10-CM

## 2018-03-23 MED ORDER — CYANOCOBALAMIN 1000 MCG/ML IJ SOLN
1000.0000 ug | Freq: Once | INTRAMUSCULAR | Status: AC
  Administered 2018-03-23: 1000 ug via INTRAMUSCULAR
  Filled 2018-03-23: qty 1

## 2018-03-23 NOTE — Progress Notes (Signed)
[place] [place], [place] [address]   CLINIC:  Medical Oncology/Hematology  PCP:  Lemmie Evens, MD [place] [address] [phone]   REASON FOR VISIT: Follow-up for Lynch Syndrome, Hx of renal cancer, hx of colon cancer, hx of follicular lymphoma.   CURRENT THERAPY: observation   INTERVAL HISTORY:  Nicole [name] 68 y.o. female returns for routine follow-up for lynch Syndrome. She reports having some numbness and tingling in her hands and feet. Otherwise she is feeling great with no other complaints at this time. Denies any nausea, vomiting, or diarrhea. Denies any new pains. Had not noticed any recent bleeding such as epistaxis, hematuria or hematochezia. Denies recent chest pain on exertion, shortness of breath on minimal exertion, pre-syncopal episodes, or palpitations. Denies any recent fevers, infections, or recent hospitalizations. She reports her appetite at 100% and her energy level at 75%.    REVIEW OF SYSTEMS:  Review of Systems  Genitourinary: Positive for difficulty urinating.   Neurological: Positive for numbness.  All other systems reviewed and are negative.    PAST MEDICAL/SURGICAL HISTORY:  Past Medical History:  Diagnosis Date  . Arthritis    shorulder  . B12 deficiency   . Chronic diarrhea SECONDARY HX COLON CANCER  . Diarrhea   . History of colon cancer [facility] SYNDROME ---  COLORECTAL CANCER S/P COLECTOMY X3  LAST ONE 2007   NO RECURRENCE  . History of colon cancer 05/04/2013  . History of kidney infection 08/2013  . History of kidney stones   . History of MRSA infection 08/2013   left leg  . Hx of osteopenia   . Kidney stone   . Lynch syndrome 10/17/2010   Hereditary colon cancer  . NHL (non-Hodgkin's lymphoma) ([place]) 10/17/2010   MONITORED BY DR Tressie Stalker  . Personal history of renal cancer S/P LEFT PARTIAL NEPHRECTOMY 2007--  NO RECURRENCE   DUE TO KIDNEY CANCER  . Right shoulder pain S/P ROTATOR  CUFF REPAIR   Past Surgical History:  Procedure Laterality Date  . APPENDECTOMY  1993   W/ LAVH  . COLONOSCOPY W/ POLYPECTOMY    . CYSTOSCOPY W/ RETROGRADES  07/14/2014   Procedure: CYSTOSCOPY WITH RETROGRADE PYELOGRAM;  Surgeon: Franchot Gallo, MD;  Location: [facility];  Service: Urology;;  . Consuela Mimes W/ URETERAL STENT REMOVAL Left 07/14/2014   Procedure: CYSTOSCOPY WITH STENT REMOVAL;  Surgeon: Franchot Gallo, MD;  Location: [facility];  Service: Urology;  Laterality: Left;  . CYSTOSCOPY WITH STENT PLACEMENT  02/07/2012   Procedure: CYSTOSCOPY WITH STENT PLACEMENT;  Surgeon: Franchot Gallo, MD;  Location: [facility];  Service: Urology;  Laterality: Left;  . CYSTOSCOPY WITH STENT PLACEMENT Left 07/14/2014   Procedure: CYSTOSCOPY WITH STENT PLACEMENT;  Surgeon: Franchot Gallo, MD;  Location: [facility];  Service: Urology;  Laterality: Left;  . FLEXIBLE SIGMOIDOSCOPY  08/29/2011   Procedure: FLEXIBLE SIGMOIDOSCOPY;  Surgeon: Rogene Houston, MD;  [facility];  Service: Endoscopy;  Laterality: N/A;  930  . FLEXIBLE SIGMOIDOSCOPY N/A 09/28/2012   Procedure: FLEXIBLE SIGMOIDOSCOPY;  Surgeon: Rogene Houston, MD;  [facility];  Service: Endoscopy;  Laterality: N/A;  730  . FLEXIBLE SIGMOIDOSCOPY N/A 07/08/2014   Procedure: FLEXIBLE SIGMOIDOSCOPY;  Surgeon: Rogene Houston, MD;  [facility];  Service: Endoscopy;  Laterality: N/A;  830 - moved to 10:20 - Ann to notify pt  . FLEXIBLE SIGMOIDOSCOPY N/A 08/11/2015  Procedure: FLEXIBLE SIGMOIDOSCOPY;  Surgeon: Rogene Houston, MD;  [facility];  Service: Endoscopy;  Laterality: N/A;  730  . FLEXIBLE SIGMOIDOSCOPY N/A 08/13/2016   Procedure: FLEXIBLE SIGMOIDOSCOPY;  Surgeon: Rogene Houston, MD;  [facility];  Service: Endoscopy;  Laterality: N/A;  730  . HEMICOLECTOMY  2001   right  . HEMICOLECTOMY  2002   left  .  HOLMIUM LASER APPLICATION Left 06/29/2438   Procedure: HOLMIUM LASER LITHOTRIPSY,;  Surgeon: Franchot Gallo, MD;  Location: [facility];  Service: Urology;  Laterality: Left;  . kidney resection  2007   left partial  . LAPAROSCOPIC ASSISTED VAGINAL HYSTERECTOMY  1993   W/ BILATERAL SALPINGO-OOPHORECTOMY  . LEFT FLANK EXPLORATION W/  PARTIAL LEFT NEPHRECTOMY/ LEFT HILAR LYMPH NODE BX  04-17-2005  DR DAHLSTEDT   RENAL CELL CARCINOMA  . LITHOTRIPSY  11-22013   x2  . NEPHROLITHOTOMY Left 06/09/2014   Procedure: NEPHROLITHOTOMY PERCUTANEOUS;  Surgeon: Franchot Gallo, MD;  Location: [facility];  Service: Urology;  Laterality: Left;  with STENT  . RIGHT ROTATOR  CUFF REPAIR/   BICEP REPAIR  10-17-2011  DR SUPPLE  . SUBTOTAL COLECTOMY  2007  . URETEROSCOPY WITH HOLMIUM LASER LITHOTRIPSY Left 07/14/2014   Procedure: URETEROSCOPY  EXTRACTION OF STONES;  Surgeon: Franchot Gallo, MD;  Location: [facility];  Service: Urology;  Laterality: Left;     SOCIAL HISTORY:  Social History   Socioeconomic History  . Marital status: Divorced    Spouse name: Not on file  . Number of children: Not on file  . Years of education: Not on file  . Highest education level: Not on file  Occupational History  . Not on file  Social Needs  . Financial resource strain: Not on file  . Food insecurity:    Worry: Not on file    Inability: Not on file  . Transportation needs:    Medical: Not on file    Non-medical: Not on file  Tobacco Use  . Smoking status: Never Smoker  . Smokeless tobacco: Never Used  Substance and Sexual Activity  . Alcohol use: No  . Drug use: No  . Sexual activity: Not on file  Lifestyle  . Physical activity:    Days per week: Not on file    Minutes per session: Not on file  . Stress: Not on file  Relationships  . Social connections:    Talks on phone: Not on file    Gets together: Not on file    Attends religious service: Not on file    Active  member of club or organization: Not on file    Attends meetings of clubs or organizations: Not on file    Relationship status: Not on file  . Intimate partner violence:    Fear of current or ex partner: Not on file    Emotionally abused: Not on file    Physically abused: Not on file    Forced sexual activity: Not on file  Other Topics Concern  . Not on file  Social History Narrative  . Not on file    FAMILY HISTORY:  Family History  Problem Relation Age of Onset  . Cancer Son     CURRENT MEDICATIONS:  Outpatient Encounter Medications as of 03/23/2018  Medication Sig Note  . Biotin 5000 MCG CAPS Take 5,000 mcg by mouth daily.   . cyanocobalamin (,VITAMIN B-12,) 1000 MCG/ML injection Inject 1 mL (1,000 mcg total) into the  muscle once. (Patient taking differently: Inject 1,000 mcg into the muscle every 30 (thirty) days. ) 08/07/2016: Beginning of the month  . diphenoxylate-atropine (LOMOTIL) 2.5-0.025 MG tablet TAKE 2 TABLETS BY MOUTH THREE TIMES DAILY BEFORE MEALS   . Echinacea 450 MG CAPS Take 450 mg by mouth every other day.   . furosemide (LASIX) 40 MG tablet Take 40 mg by mouth daily as needed for fluid.   Marland Kitchen ibuprofen (ADVIL,MOTRIN) 800 MG tablet Take 800 mg by mouth 2 (two) times daily as needed for pain.   Marland Kitchen Potassium Citrate 15 MEQ (1620 MG) TBCR Take 15 mEq by mouth 3 (three) times daily.    No facility-administered encounter medications on file as of 03/23/2018.     ALLERGIES:  Allergies  Allergen Reactions  . Latex Rash  . Morphine And Related Other (See Comments)    hallucinations  . Tape Rash    Certain tapes      PHYSICAL EXAM:  ECOG Performance status: 1  Vitals:   03/23/18 1000  BP: (!) 156/84  Pulse: 72  Resp: 16  Temp: 98 F (36.7 C)  SpO2: 95%   Filed Weights   03/23/18 1000  Weight: 201 lb 6 oz (91.3 kg)    Physical Exam Constitutional:      Appearance: Normal appearance. She is normal weight.  Cardiovascular:     Rate and Rhythm: Normal  rate and regular rhythm.     Heart sounds: Normal heart sounds.  Pulmonary:     Effort: Pulmonary effort is normal.     Breath sounds: Normal breath sounds.  Abdominal:     General: Abdomen is flat.     Palpations: Abdomen is soft.  Musculoskeletal: Normal range of motion.  Skin:    General: Skin is warm and dry.  Neurological:     Mental Status: She is alert and oriented to person, place, and time. Mental status is at baseline.  Psychiatric:        Mood and Affect: Mood normal.        Behavior: Behavior normal.        Thought Content: Thought content normal.        Judgment: Judgment normal.      LABORATORY DATA:  I have reviewed the labs as listed.  CBC    Component Value Date/Time   WBC 6.5 03/16/2018 0824   RBC 4.30 03/16/2018 0824   HGB 12.6 03/16/2018 0824   HCT 40.0 03/16/2018 0824   PLT 154 03/16/2018 0824   MCV 93.0 03/16/2018 0824   MCH 29.3 03/16/2018 0824   MCHC 31.5 03/16/2018 0824   RDW 12.5 03/16/2018 0824   LYMPHSABS 1.2 03/16/2018 0824   MONOABS 0.6 03/16/2018 0824   EOSABS 0.2 03/16/2018 0824   BASOSABS 0.1 03/16/2018 0824   CMP Latest Ref Rng & Units 03/16/2018 09/17/2017 03/31/2017  Glucose 70 - 99 mg/dL 105(H) 109(H) 89  BUN 8 - 23 mg/dL _0 Creatinine 0.44 - 1.00 mg/dL 0.75 0.82 0.74  Sodium 135 - 145 mmol/L 139 143 139  Potassium 3.5 - 5.1 mmol/L 4.3 4.3 4.6  Chloride 98 - 111 mmol/L 108 108 104  CO2 22 - 32 mmol/L _1 Calcium 8.9 - 10.3 mg/dL 8.6(L) 9.2 8.9  Total Protein 6.5 - 8.1 g/dL 6.4(L) 7.2 7.0  Total Bilirubin 0.3 - 1.2 mg/dL 0.6 0.5 0.7  Alkaline Phos 38 - 126 U/L 54 72 66  AST 15 - 41 U/L 16 19 20  ALT 0 - 44 U/L _0 DIAGNOSTIC IMAGING:  I have independently reviewed the scans and discussed with the patient.   I have reviewed Francene Finders, NP's note and agree with the documentation.  I personally performed a face-to-face visit, made revisions and my assessment and plan is as  follows.    ASSESSMENT & PLAN:   Renal cell carcinoma 1.  Left renal cell carcinoma: -Status post left partial nephrectomy on surveillance. - We reviewed the results of the CT abdomen and pelvis dated 03/20/2018 which showed new 1.9 cm left renal upper pole solid enhancing slightly exophytic mass concerning for recurrence of the left renal cell carcinoma, along the partial nephrectomy site superiorly. -No significant change in the retroperitoneal, pericaval and periaortic adenopathy. -I have recommended doing a PET CT scan.  She has an appointment to see Dr. Diona Fanti tomorrow.  2.  Lynch syndrome: - Patient has MLH1 mutation. - Status post TAH and BSO in 1993. -She follows up closely with Dr.Rehman for exams.  3.  Colon cancer: -She underwent subtotal colectomy with ileorectal anastomosis. -She has occasional diarrhea which is well controlled.  She takes Imodium as needed.  4.  Follicular non-Hodgkin's lymphoma: -Sigmoid colon resection in 2007 with 2+ lymph nodes. - She denies any fevers, night sweats or weight loss.      Orders placed this encounter:  Orders Placed This Encounter  Procedures  . NM PET Image Initial (PI) Whole Body  . CBC with Differential/Platelet  . Comprehensive metabolic panel      Derek Jack, MD [place] [phone]

## 2018-03-23 NOTE — Patient Instructions (Signed)
[facility] at [facility] Discharge Instructions     Thank you for choosing [facility] at [facility] to provide your oncology and hematology care.  To afford each patient quality time with our provider, please arrive at least 15 minutes before your scheduled appointment time.   If you have a lab appointment with the Cancer Center please come in thru the  Main Entrance and check in at the main information desk  You need to re-schedule your appointment should you arrive 10 or more minutes late.  We strive to give you quality time with our providers, and arriving late affects you and other patients whose appointments are after yours.  Also, if you no show three or more times for appointments you may be dismissed from the clinic at the providers discretion.     Again, thank you for choosing [facility].  Our hope is that these requests will decrease the amount of time that you wait before being seen by our physicians.       _____________________________________________________________  Should you have questions after your visit to  Cancer Center, please contact our office at [phone] between the hours of 8:00 a.m. and 4:30 p.m.  Voicemails left after 4:00 p.m. will not be returned until the following business day.  For prescription refill requests, have your pharmacy contact our office and allow 72 hours.    Cancer Center Support Programs:   > Cancer Support Group  2nd Tuesday of the month 1pm-2pm, Journey Room    

## 2018-03-23 NOTE — Progress Notes (Signed)
Nicole [name] tolerated Vit B12 injection well without complaints or incident. VSS Pt discharged self ambulatory in satisfactory condition

## 2018-03-23 NOTE — Patient Instructions (Signed)
[facility] at [facility] Discharge Instructions  Received Vit B12 injection today. Follow-up as scheduled. Call clinic for any questions or concerns   Thank you for choosing [facility] at [facility] to provide your oncology and hematology care.  To afford each patient quality time with our provider, please arrive at least 15 minutes before your scheduled appointment time.   If you have a lab appointment with the Cancer Center please come in thru the  Main Entrance and check in at the main information desk  You need to re-schedule your appointment should you arrive 10 or more minutes late.  We strive to give you quality time with our providers, and arriving late affects you and other patients whose appointments are after yours.  Also, if you no show three or more times for appointments you may be dismissed from the clinic at the providers discretion.     Again, thank you for choosing [facility].  Our hope is that these requests will decrease the amount of time that you wait before being seen by our physicians.       _____________________________________________________________  Should you have questions after your visit to [facility], please contact our office at [phone] between the hours of 8:00 a.m. and 4:30 p.m.  Voicemails left after 4:00 p.m. will not be returned until the following business day.  For prescription refill requests, have your pharmacy contact our office and allow 72 hours.    Cancer Center Support Programs:   > Cancer Support Group  2nd Tuesday of the month 1pm-2pm, Journey Room   

## 2018-03-23 NOTE — Assessment & Plan Note (Signed)
1.  Left renal cell carcinoma: -Status post left partial nephrectomy on surveillance. - We reviewed the results of the CT abdomen and pelvis dated 03/20/2018 which showed new 1.9 cm left renal upper pole solid enhancing slightly exophytic mass concerning for recurrence of the left renal cell carcinoma, along the partial nephrectomy site superiorly. -No significant change in the retroperitoneal, pericaval and periaortic adenopathy. -I have recommended doing a PET CT scan.  She has an appointment to see Dr. Dahlstedt tomorrow.  2.  Lynch syndrome: - Patient has MLH1 mutation. - Status post TAH and BSO in 1993. -She follows up closely with Dr.Rehman for exams.  3.  Colon cancer: -She underwent subtotal colectomy with ileorectal anastomosis. -She has occasional diarrhea which is well controlled.  She takes Imodium as needed.  4.  Follicular non-Hodgkin's lymphoma: -Sigmoid colon resection in 2007 with 2+ lymph nodes. - She denies any fevers, night sweats or weight loss. 

## 2018-03-24 ENCOUNTER — Ambulatory Visit: Payer: PPO | Admitting: Urology

## 2018-03-24 DIAGNOSIS — C642 Malignant neoplasm of left kidney, except renal pelvis: Secondary | ICD-10-CM | POA: Diagnosis not present

## 2018-03-24 DIAGNOSIS — N200 Calculus of kidney: Secondary | ICD-10-CM | POA: Diagnosis not present

## 2018-04-06 ENCOUNTER — Encounter (HOSPITAL_COMMUNITY)
Admission: RE | Admit: 2018-04-06 | Discharge: 2018-04-06 | Disposition: A | Discharge status: Home / Self Care | Payer: PPO | Source: Ambulatory Visit | Attending: Nurse Practitioner | Admitting: Nurse Practitioner

## 2018-04-06 DIAGNOSIS — K8020 Calculus of gallbladder without cholecystitis without obstruction: Secondary | ICD-10-CM | POA: Diagnosis not present

## 2018-04-06 DIAGNOSIS — C642 Malignant neoplasm of left kidney, except renal pelvis: Secondary | ICD-10-CM | POA: Diagnosis not present

## 2018-04-06 DIAGNOSIS — Z85038 Personal history of other malignant neoplasm of large intestine: Secondary | ICD-10-CM | POA: Insufficient documentation

## 2018-04-06 DIAGNOSIS — N289 Disorder of kidney and ureter, unspecified: Secondary | ICD-10-CM | POA: Diagnosis not present

## 2018-04-06 DIAGNOSIS — C8200 Follicular lymphoma grade I, unspecified site: Secondary | ICD-10-CM | POA: Insufficient documentation

## 2018-04-06 DIAGNOSIS — Z1509 Genetic susceptibility to other malignant neoplasm: Secondary | ICD-10-CM | POA: Insufficient documentation

## 2018-04-06 MED ORDER — FLUDEOXYGLUCOSE F - 18 (FDG) INJECTION
8.2200 | Freq: Once | INTRAVENOUS | Status: AC | PRN
  Administered 2018-04-06: 11.32 via INTRAVENOUS

## 2018-04-09 ENCOUNTER — Ambulatory Visit (HOSPITAL_COMMUNITY): Payer: PPO | Admitting: Hematology

## 2018-04-09 ENCOUNTER — Other Ambulatory Visit (HOSPITAL_COMMUNITY): Payer: PPO

## 2018-04-13 ENCOUNTER — Inpatient Hospital Stay (HOSPITAL_BASED_OUTPATIENT_CLINIC_OR_DEPARTMENT_OTHER): Payer: PPO | Admitting: Hematology

## 2018-04-13 ENCOUNTER — Encounter (HOSPITAL_COMMUNITY): Payer: Self-pay | Admitting: Hematology

## 2018-04-13 ENCOUNTER — Inpatient Hospital Stay (HOSPITAL_COMMUNITY): Payer: PPO

## 2018-04-13 VITALS — BP 144/75 | HR 76 | Temp 98.2°F | Resp 16 | Wt 201.0 lb

## 2018-04-13 DIAGNOSIS — Z8614 Personal history of Methicillin resistant Staphylococcus aureus infection: Secondary | ICD-10-CM

## 2018-04-13 DIAGNOSIS — Z933 Colostomy status: Secondary | ICD-10-CM | POA: Diagnosis not present

## 2018-04-13 DIAGNOSIS — M129 Arthropathy, unspecified: Secondary | ICD-10-CM

## 2018-04-13 DIAGNOSIS — Z1509 Genetic susceptibility to other malignant neoplasm: Secondary | ICD-10-CM

## 2018-04-13 DIAGNOSIS — C8290 Follicular lymphoma, unspecified, unspecified site: Secondary | ICD-10-CM

## 2018-04-13 DIAGNOSIS — Z85038 Personal history of other malignant neoplasm of large intestine: Secondary | ICD-10-CM

## 2018-04-13 DIAGNOSIS — Z79899 Other long term (current) drug therapy: Secondary | ICD-10-CM

## 2018-04-13 DIAGNOSIS — C8200 Follicular lymphoma grade I, unspecified site: Secondary | ICD-10-CM

## 2018-04-13 DIAGNOSIS — E538 Deficiency of other specified B group vitamins: Secondary | ICD-10-CM

## 2018-04-13 DIAGNOSIS — R590 Localized enlarged lymph nodes: Secondary | ICD-10-CM

## 2018-04-13 DIAGNOSIS — C642 Malignant neoplasm of left kidney, except renal pelvis: Secondary | ICD-10-CM | POA: Diagnosis not present

## 2018-04-13 DIAGNOSIS — Z905 Acquired absence of kidney: Secondary | ICD-10-CM | POA: Diagnosis not present

## 2018-04-13 DIAGNOSIS — Z87442 Personal history of urinary calculi: Secondary | ICD-10-CM

## 2018-04-13 DIAGNOSIS — R197 Diarrhea, unspecified: Secondary | ICD-10-CM | POA: Diagnosis not present

## 2018-04-13 DIAGNOSIS — Z90710 Acquired absence of both cervix and uterus: Secondary | ICD-10-CM

## 2018-04-13 DIAGNOSIS — Z90722 Acquired absence of ovaries, bilateral: Secondary | ICD-10-CM

## 2018-04-13 LAB — CBC WITH DIFFERENTIAL/PLATELET
Abs Immature Granulocytes: 0.03 10*3/uL (ref 0.00–0.07)
Basophils Absolute: 0.1 10*3/uL (ref 0.0–0.1)
Basophils Relative: 1 %
Eosinophils Absolute: 0.2 10*3/uL (ref 0.0–0.5)
Eosinophils Relative: 3 %
HEMATOCRIT: 40.4 % (ref 36.0–46.0)
Hemoglobin: 12.9 g/dL (ref 12.0–15.0)
Immature Granulocytes: 0 %
Lymphocytes Relative: 19 %
Lymphs Abs: 1.4 10*3/uL (ref 0.7–4.0)
MCH: 29.9 pg (ref 26.0–34.0)
MCHC: 31.9 g/dL (ref 30.0–36.0)
MCV: 93.5 fL (ref 80.0–100.0)
MONOS PCT: 8 %
Monocytes Absolute: 0.6 10*3/uL (ref 0.1–1.0)
NEUTROS PCT: 69 %
Neutro Abs: 5 10*3/uL (ref 1.7–7.7)
Platelets: 154 10*3/uL (ref 150–400)
RBC: 4.32 MIL/uL (ref 3.87–5.11)
RDW: 12.6 % (ref 11.5–15.5)
WBC: 7.4 10*3/uL (ref 4.0–10.5)
nRBC: 0 % (ref 0.0–0.2)

## 2018-04-13 LAB — COMPREHENSIVE METABOLIC PANEL
ALK PHOS: 57 U/L (ref 38–126)
ALT: 16 U/L (ref 0–44)
AST: 17 U/L (ref 15–41)
Albumin: 3.5 g/dL (ref 3.5–5.0)
Anion gap: 7 (ref 5–15)
BUN: 21 mg/dL (ref 8–23)
CO2: 26 mmol/L (ref 22–32)
Calcium: 8.9 mg/dL (ref 8.9–10.3)
Chloride: 108 mmol/L (ref 98–111)
Creatinine, Ser: 0.84 mg/dL (ref 0.44–1.00)
GFR calc Af Amer: >60 mL/min (ref >60)
GFR calc non Af Amer: >60 mL/min (ref >60)
GLUCOSE: 133 mg/dL — AB (ref 70–99)
Potassium: 4.3 mmol/L (ref 3.5–5.1)
Sodium: 141 mmol/L (ref 135–145)
Total Bilirubin: 0.5 mg/dL (ref 0.3–1.2)
Total Protein: 6.5 g/dL (ref 6.5–8.1)

## 2018-04-13 MED ORDER — CYANOCOBALAMIN 1000 MCG/ML IJ SOLN
1000.0000 ug | Freq: Once | INTRAMUSCULAR | Status: AC
  Administered 2018-04-13: 1000 ug via INTRAMUSCULAR

## 2018-04-13 NOTE — Progress Notes (Signed)
Nicole [name], [place] [address]   CLINIC:  Medical Oncology/Hematology  PCP:  Lemmie Evens, MD [place] [address] [phone]   REASON FOR VISIT: Follow-up for Lynch Syndrome, Hx of renal cancer, hx of colon cancer, hx of follicular lymphoma.   CURRENT THERAPY: observation   INTERVAL HISTORY:  Nicole [name] 68 y.o. female returns for routine follow-up for Lynch Syndrome, Hx of renal cancer, hx of colon cancer, hx of follicular lymphoma. Nicole [name] is here today and doing well. He has no complaints today. Nicole [name] did report they found microscopic blood in Nicole [name] urine at the Urology appointment. Denies any nausea, vomiting, or diarrhea. Denies any new pains. Had not noticed any recent bleeding such as epistaxis, hematuria or hematochezia. Denies recent chest pain on exertion, shortness of breath on minimal exertion, pre-syncopal episodes, or palpitations. Denies any numbness or tingling in hands or feet. Denies any recent fevers, infections, or recent hospitalizations. Patient reports appetite at 100% and energy level at 100%.   REVIEW OF SYSTEMS:  Review of Systems  All other systems reviewed and are negative.    PAST MEDICAL/SURGICAL HISTORY:  Past Medical History:  Diagnosis Date  . Arthritis    shorulder  . B12 deficiency   . Chronic diarrhea SECONDARY HX COLON CANCER  . Diarrhea   . History of colon cancer [facility] SYNDROME ---  COLORECTAL CANCER S/P COLECTOMY X3  LAST ONE 2007   NO RECURRENCE  . History of colon cancer 05/04/2013  . History of kidney infection 08/2013  . History of kidney stones   . History of MRSA infection 08/2013   left leg  . Hx of osteopenia   . Kidney stone   . Lynch syndrome 10/17/2010   Hereditary colon cancer  . NHL (non-Hodgkin's lymphoma) ([place]) 10/17/2010   MONITORED BY DR Tressie Stalker  . Personal history of renal cancer S/P LEFT PARTIAL NEPHRECTOMY 2007--  NO RECURRENCE   DUE TO KIDNEY CANCER  . Right  shoulder pain S/P ROTATOR CUFF REPAIR   Past Surgical History:  Procedure Laterality Date  . APPENDECTOMY  1993   W/ LAVH  . COLONOSCOPY W/ POLYPECTOMY    . CYSTOSCOPY W/ RETROGRADES  07/14/2014   Procedure: CYSTOSCOPY WITH RETROGRADE PYELOGRAM;  Surgeon: Franchot Gallo, MD;  Location[facility];  Service: Urology;;  . Consuela Mimes W/ URETERAL STENT REMOVAL Left 07/14/2014   Procedure: CYSTOSCOPY WITH STENT REMOVAL;  Surgeon: Franchot Gallo, MD;  Location: [facility];  Service: Urology;  Laterality: Left;  . CYSTOSCOPY WITH STENT PLACEMENT  02/07/2012   Procedure: CYSTOSCOPY WITH STENT PLACEMENT;  Surgeon: Franchot Gallo, MD;  Location[facility];  Service: Urology;  Laterality: Left;  . CYSTOSCOPY WITH STENT PLACEMENT Left 07/14/2014   Procedure: CYSTOSCOPY WITH STENT PLACEMENT;  Surgeon: Franchot Gallo, MD;  Location: [facility];  Service: Urology;  Laterality: Left;  . FLEXIBLE SIGMOIDOSCOPY  08/29/2011   Procedure: FLEXIBLE SIGMOIDOSCOPY;  Surgeon: Rogene Houston, MD;  [facility];  Service: Endoscopy;  Laterality: N/A;  930  . FLEXIBLE SIGMOIDOSCOPY N/A 09/28/2012   Procedure: FLEXIBLE SIGMOIDOSCOPY;  Surgeon: Rogene Houston, MD;  [facility];  Service: Endoscopy;  Laterality: N/A;  730  . FLEXIBLE SIGMOIDOSCOPY N/A 07/08/2014   Procedure: FLEXIBLE SIGMOIDOSCOPY;  Surgeon: Rogene Houston, MD;  [facility];  Service: Endoscopy;  Laterality: N/A;  830 - moved to 10:20 - Ann to notify  pt  . FLEXIBLE SIGMOIDOSCOPY N/A 08/11/2015   Procedure: FLEXIBLE SIGMOIDOSCOPY;  Surgeon: Rogene Houston, MD;  [facility];  Service: Endoscopy;  Laterality: N/A;  730  . FLEXIBLE SIGMOIDOSCOPY N/A 08/13/2016   Procedure: FLEXIBLE SIGMOIDOSCOPY;  Surgeon: Rogene Houston, MD;  [facility];  Service: Endoscopy;  Laterality: N/A;  730  . HEMICOLECTOMY  2001   right  .  HEMICOLECTOMY  2002   left  . HOLMIUM LASER APPLICATION Left 04/11/5807   Procedure: HOLMIUM LASER LITHOTRIPSY,;  Surgeon: Franchot Gallo, MD;  Location: [facility];  Service: Urology;  Laterality: Left;  . kidney resection  2007   left partial  . LAPAROSCOPIC ASSISTED VAGINAL HYSTERECTOMY  1993   W/ BILATERAL SALPINGO-OOPHORECTOMY  . LEFT FLANK EXPLORATION W/  PARTIAL LEFT NEPHRECTOMY/ LEFT HILAR LYMPH NODE BX  04-17-2005  DR DAHLSTEDT   RENAL CELL CARCINOMA  . LITHOTRIPSY  11-22013   x2  . NEPHROLITHOTOMY Left 06/09/2014   Procedure: NEPHROLITHOTOMY PERCUTANEOUS;  Surgeon: Franchot Gallo, MD;  Location: [facility];  Service: Urology;  Laterality: Left;  with STENT  . RIGHT ROTATOR  CUFF REPAIR/   BICEP REPAIR  10-17-2011  DR SUPPLE  . SUBTOTAL COLECTOMY  2007  . URETEROSCOPY WITH HOLMIUM LASER LITHOTRIPSY Left 07/14/2014   Procedure: URETEROSCOPY  EXTRACTION OF STONES;  Surgeon: Franchot Gallo, MD;  Location: [facility];  Service: Urology;  Laterality: Left;     SOCIAL HISTORY:  Social History   Socioeconomic History  . Marital status: Divorced    Spouse name: Not on file  . Number of children: Not on file  . Years of education: Not on file  . Highest education level: Not on file  Occupational History  . Not on file  Social Needs  . Financial resource strain: Not on file  . Food insecurity:    Worry: Not on file    Inability: Not on file  . Transportation needs:    Medical: Not on file    Non-medical: Not on file  Tobacco Use  . Smoking status: Never Smoker  . Smokeless tobacco: Never Used  Substance and Sexual Activity  . Alcohol use: No  . Drug use: No  . Sexual activity: Not on file  Lifestyle  . Physical activity:    Days per week: Not on file    Minutes per session: Not on file  . Stress: Not on file  Relationships  . Social connections:    Talks on phone: Not on file    Gets together: Not on file    Attends religious  service: Not on file    Active member of club or organization: Not on file    Attends meetings of clubs or organizations: Not on file    Relationship status: Not on file  . Intimate partner violence:    Fear of current or ex partner: Not on file    Emotionally abused: Not on file    Physically abused: Not on file    Forced sexual activity: Not on file  Other Topics Concern  . Not on file  Social History Narrative  . Not on file    FAMILY HISTORY:  Family History  Problem Relation Age of Onset  . Cancer Son     CURRENT MEDICATIONS:  Outpatient Encounter Medications as of 04/13/2018  Medication Sig Note  . Biotin 5000 MCG CAPS Take 5,000 mcg by mouth daily.   . cyanocobalamin (,VITAMIN B-12,) 1000 MCG/ML  injection Inject 1 mL (1,000 mcg total) into the muscle once. (Patient taking differently: Inject 1,000 mcg into the muscle every 30 (thirty) days. ) 08/07/2016: Beginning of the month  . diphenoxylate-atropine (LOMOTIL) 2.5-0.025 MG tablet TAKE 2 TABLETS BY MOUTH THREE TIMES DAILY BEFORE MEALS   . Echinacea 450 MG CAPS Take 450 mg by mouth every other day.   . furosemide (LASIX) 40 MG tablet Take 40 mg by mouth daily as needed for fluid.   Marland Kitchen ibuprofen (ADVIL,MOTRIN) 800 MG tablet Take 800 mg by mouth 2 (two) times daily as needed for pain.   Marland Kitchen Potassium Citrate 15 MEQ (1620 MG) TBCR Take 15 mEq by mouth 3 (three) times daily.    No facility-administered encounter medications on file as of 04/13/2018.     ALLERGIES:  Allergies  Allergen Reactions  . Latex Rash  . Morphine And Related Other (See Comments)    hallucinations  . Tape Rash    Certain tapes      PHYSICAL EXAM:  ECOG Performance status: 1  Vitals:   04/13/18 1011  BP: (!) 144/75  Pulse: 76  Resp: 16  Temp: 98.2 F (36.8 C)  SpO2: 98%   Filed Weights   04/13/18 1011  Weight: 201 lb (91.2 kg)    Physical Exam Constitutional:      Appearance: Normal appearance. Nicole [name] is normal weight.    Musculoskeletal: Normal range of motion.  Skin:    General: Skin is warm and dry.  Neurological:     Mental Status: Nicole [name] is alert and oriented to person, place, and time. Mental status is at baseline.  Psychiatric:        Mood and Affect: Mood normal.        Behavior: Behavior normal.        Thought Content: Thought content normal.        Judgment: Judgment normal.      LABORATORY DATA:  I have reviewed the labs as listed.  CBC    Component Value Date/Time   WBC 7.4 04/13/2018 0910   RBC 4.32 04/13/2018 0910   HGB 12.9 04/13/2018 0910   HCT 40.4 04/13/2018 0910   PLT 154 04/13/2018 0910   MCV 93.5 04/13/2018 0910   MCH 29.9 04/13/2018 0910   MCHC 31.9 04/13/2018 0910   RDW 12.6 04/13/2018 0910   LYMPHSABS 1.4 04/13/2018 0910   MONOABS 0.6 04/13/2018 0910   EOSABS 0.2 04/13/2018 0910   BASOSABS 0.1 04/13/2018 0910   CMP Latest Ref Rng & Units 04/13/2018 03/16/2018 09/17/2017  Glucose 70 - 99 mg/dL 133(H) 105(H) 109(H)  BUN 8 - 23 mg/dL '21 21 16  '$ Creatinine 0.44 - 1.00 mg/dL 0.84 0.75 0.82  Sodium 135 - 145 mmol/L 141 139 143  Potassium 3.5 - 5.1 mmol/L 4.3 4.3 4.3  Chloride 98 - 111 mmol/L 108 108 108  CO2 22 - 32 mmol/L '26 24 25  '$ Calcium 8.9 - 10.3 mg/dL 8.9 8.6(L) 9.2  Total Protein 6.5 - 8.1 g/dL 6.5 6.4(L) 7.2  Total Bilirubin 0.3 - 1.2 mg/dL 0.5 0.6 0.5  Alkaline Phos 38 - 126 U/L 57 54 72  AST 15 - 41 U/L '17 16 19  '$ ALT 0 - 44 U/L '16 16 20       '$ DIAGNOSTIC IMAGING:  I have independently reviewed the scans and discussed with the patient.   I have reviewed Francene Finders, NP's note and agree with the documentation.  I personally performed a face-to-face visit, made revisions and  my assessment and plan is as follows.    ASSESSMENT & PLAN:   Renal cell carcinoma 1.  Left renal cell carcinoma: -Status post left partial nephrectomy on 04/17/2005.  Pathology revealed stage T1 a clear cell carcinoma, Fuhrman nuclear grade 3/4, tumor size was 1.9 cm. - CT  abdomen pelvis on 03/20/2018 showed new 1.9 cm left upper pole kidney mass enhancing slightly concerning for recurrence.  This was seen along the partial nephrectomy site superiorly.  -No significant change in the retroperitoneal, pericaval and periaortic adenopathy, largest measuring 1.6 cm. - We discussed the results of the PET CT scan dated 04/06/2018 showing 17 mm lesion in the upper pole of the left kidney with no FDG accumulation above background renal parenchyma levels.  Mild lymphadenopathy in the chest, and abdomen shows low-level hypermetabolism. -An MRI of the abdomen with and without contrast to further identify this lesion was recommended. -We will see Nicole [name] back after the MRI.  2.  Lynch syndrome: - Patient has MLH1 mutation. - Status post TAH and BSO in 1993. -Nicole [name] follows up closely with Dr.Rehman for exams.  3.  Colon cancer: -Nicole [name] underwent subtotal colectomy with ileorectal anastomosis. -Nicole [name] has occasional diarrhea which is well controlled.  Nicole [name] takes Imodium as needed.  4.  Follicular non-Hodgkin's lymphoma: -Sigmoid colon resection in 2007 with 2+ lymph nodes. - Nicole [name] denies any fevers, night sweats or weight loss.      Orders placed this encounter:  Orders Placed This Encounter  Procedures  . MR Abdomen W [place], [place] [phone]

## 2018-04-13 NOTE — Patient Instructions (Signed)
[place] at [facility]  Discharge Instructions:  You received a B12 injection today. _______________________________________________________________  Thank you for choosing [place] at [facility] to provide your oncology and hematology care.  To afford each patient quality time with our providers, please arrive at least 15 minutes before your scheduled appointment.  You need to re-schedule your appointment if you arrive 10 or more minutes late.  We strive to give you quality time with our providers, and arriving late affects you and other patients whose appointments are after yours.  Also, if you no show three or more times for appointments you may be dismissed from the clinic.  Again, thank you for choosing [place] at [place] hope is that these requests will allow you access to exceptional care and in a timely manner. _______________________________________________________________  If you have questions after your visit, please contact our office at [phone] between the hours of 8:30 a.m. and 5:00 p.m. Voicemails left after 4:30 p.m. will not be returned until the following business day. _______________________________________________________________  For prescription refill requests, have your pharmacy contact our office. _______________________________________________________________  Recommendations made by the consultant and any test results will be sent to your referring physician. _______________________________________________________________

## 2018-04-13 NOTE — Patient Instructions (Signed)
[facility] at [facility] Discharge Instructions     Thank you for choosing [facility] at [facility] to provide your oncology and hematology care.  To afford each patient quality time with our provider, please arrive at least 15 minutes before your scheduled appointment time.   If you have a lab appointment with the Cancer Center please come in thru the  Main Entrance and check in at the main information desk  You need to re-schedule your appointment should you arrive 10 or more minutes late.  We strive to give you quality time with our providers, and arriving late affects you and other patients whose appointments are after yours.  Also, if you no show three or more times for appointments you may be dismissed from the clinic at the providers discretion.     Again, thank you for choosing [facility].  Our hope is that these requests will decrease the amount of time that you wait before being seen by our physicians.       _____________________________________________________________  Should you have questions after your visit to [facility], please contact our office at [phone] between the hours of 8:00 a.m. and 4:30 p.m.  Voicemails left after 4:00 p.m. will not be returned until the following business day.  For prescription refill requests, have your pharmacy contact our office and allow 72 hours.    Cancer Center Support Programs:   > Cancer Support Group  2nd Tuesday of the month 1pm-2pm, Journey Room    

## 2018-04-13 NOTE — Assessment & Plan Note (Signed)
1.  Left renal cell carcinoma: -Status post left partial nephrectomy on 04/17/2005.  Pathology revealed stage T1 a clear cell carcinoma, Fuhrman nuclear grade 3/4, tumor size was 1.9 cm. - CT abdomen pelvis on 03/20/2018 showed new 1.9 cm left upper pole kidney mass enhancing slightly concerning for recurrence.  This was seen along the partial nephrectomy site superiorly.  -No significant change in the retroperitoneal, pericaval and periaortic adenopathy, largest measuring 1.6 cm. - We discussed the results of the PET CT scan dated 04/06/2018 showing 17 mm lesion in the upper pole of the left kidney with no FDG accumulation above background renal parenchyma levels.  Mild lymphadenopathy in the chest, and abdomen shows low-level hypermetabolism. -An MRI of the abdomen with and without contrast to further identify this lesion was recommended. -We will see her back after the MRI.  2.  Lynch syndrome: - Patient has MLH1 mutation. - Status post TAH and BSO in 1993. -She follows up closely with Dr.Rehman for exams.  3.  Colon cancer: -She underwent subtotal colectomy with ileorectal anastomosis. -She has occasional diarrhea which is well controlled.  She takes Imodium as needed.  4.  Follicular non-Hodgkin's lymphoma: -Sigmoid colon resection in 2007 with 2+ lymph nodes. - She denies any fevers, night sweats or weight loss.

## 2018-04-13 NOTE — Progress Notes (Signed)
Mattalynn B Kille tolerated B12 injection without incident or complaint. VSS. Discharged self ambulatory in satisfactory condition.

## 2018-04-21 ENCOUNTER — Ambulatory Visit (HOSPITAL_COMMUNITY)
Admission: RE | Admit: 2018-04-21 | Discharge: 2018-04-21 | Disposition: A | Discharge status: Home / Self Care | Payer: PPO | Source: Ambulatory Visit | Attending: Nurse Practitioner | Admitting: Nurse Practitioner

## 2018-04-21 DIAGNOSIS — C642 Malignant neoplasm of left kidney, except renal pelvis: Secondary | ICD-10-CM | POA: Diagnosis not present

## 2018-04-21 DIAGNOSIS — N289 Disorder of kidney and ureter, unspecified: Secondary | ICD-10-CM | POA: Diagnosis not present

## 2018-04-21 DIAGNOSIS — K8020 Calculus of gallbladder without cholecystitis without obstruction: Secondary | ICD-10-CM | POA: Diagnosis not present

## 2018-04-21 MED ORDER — GADOBUTROL 1 MMOL/ML IV SOLN
10.0000 mL | Freq: Once | INTRAVENOUS | Status: AC | PRN
  Administered 2018-04-21: 7 mL via INTRAVENOUS

## 2018-04-23 ENCOUNTER — Ambulatory Visit (HOSPITAL_COMMUNITY): Payer: PPO

## 2018-04-23 ENCOUNTER — Inpatient Hospital Stay (HOSPITAL_COMMUNITY): Payer: PPO | Attending: Oncology | Admitting: Hematology

## 2018-04-23 ENCOUNTER — Other Ambulatory Visit: Payer: Self-pay

## 2018-04-23 ENCOUNTER — Encounter (HOSPITAL_COMMUNITY): Payer: Self-pay | Admitting: Hematology

## 2018-04-23 VITALS — BP 153/78 | HR 85 | Temp 98.0°F | Resp 16 | Wt 199.0 lb

## 2018-04-23 DIAGNOSIS — Z905 Acquired absence of kidney: Secondary | ICD-10-CM

## 2018-04-23 DIAGNOSIS — E538 Deficiency of other specified B group vitamins: Secondary | ICD-10-CM | POA: Insufficient documentation

## 2018-04-23 DIAGNOSIS — M129 Arthropathy, unspecified: Secondary | ICD-10-CM | POA: Diagnosis not present

## 2018-04-23 DIAGNOSIS — Z8572 Personal history of non-Hodgkin lymphomas: Secondary | ICD-10-CM | POA: Insufficient documentation

## 2018-04-23 DIAGNOSIS — C642 Malignant neoplasm of left kidney, except renal pelvis: Secondary | ICD-10-CM | POA: Diagnosis not present

## 2018-04-23 DIAGNOSIS — Z800 Family history of malignant neoplasm of digestive organs: Secondary | ICD-10-CM | POA: Insufficient documentation

## 2018-04-23 DIAGNOSIS — Z79899 Other long term (current) drug therapy: Secondary | ICD-10-CM | POA: Diagnosis not present

## 2018-04-23 DIAGNOSIS — Z85038 Personal history of other malignant neoplasm of large intestine: Secondary | ICD-10-CM | POA: Diagnosis not present

## 2018-04-23 DIAGNOSIS — R197 Diarrhea, unspecified: Secondary | ICD-10-CM | POA: Insufficient documentation

## 2018-04-23 DIAGNOSIS — Z1509 Genetic susceptibility to other malignant neoplasm: Secondary | ICD-10-CM | POA: Diagnosis not present

## 2018-04-23 DIAGNOSIS — M25519 Pain in unspecified shoulder: Secondary | ICD-10-CM | POA: Diagnosis not present

## 2018-04-23 DIAGNOSIS — Z87442 Personal history of urinary calculi: Secondary | ICD-10-CM | POA: Insufficient documentation

## 2018-04-23 NOTE — Patient Instructions (Signed)
[facility] at [facility] Discharge Instructions     Thank you for choosing [facility] at [facility] to provide your oncology and hematology care.  To afford each patient quality time with our provider, please arrive at least 15 minutes before your scheduled appointment time.   If you have a lab appointment with the Cancer Center please come in thru the  Main Entrance and check in at the main information desk  You need to re-schedule your appointment should you arrive 10 or more minutes late.  We strive to give you quality time with our providers, and arriving late affects you and other patients whose appointments are after yours.  Also, if you no show three or more times for appointments you may be dismissed from the clinic at the providers discretion.     Again, thank you for choosing [facility].  Our hope is that these requests will decrease the amount of time that you wait before being seen by our physicians.       _____________________________________________________________  Should you have questions after your visit to [facility], please contact our office at [phone] between the hours of 8:00 a.m. and 4:30 p.m.  Voicemails left after 4:00 p.m. will not be returned until the following business day.  For prescription refill requests, have your pharmacy contact our office and allow 72 hours.    Cancer Center Support Programs:   > Cancer Support Group  2nd Tuesday of the month 1pm-2pm, Journey Room    

## 2018-04-23 NOTE — Progress Notes (Signed)
Nicole [name], [place] [id number]   CLINIC:  Medical Oncology/Hematology  PCP:  Lemmie Evens, MD [place] [address] [phone]   REASON FOR VISIT: Follow-up for Lynch Syndrome, Hx of renal cancer, hx of colon cancer, hx of follicular lymphoma.  CURRENT THERAPY:observation and referal to Dr. Diona Fanti    INTERVAL HISTORY:  Nicole [name] 68 y.o. female returns for routine follow-up Lynch Syndrome, Hx of renal cancer, hx of colon cancer, hx of follicular lymphoma.She has no complaints today. She is curious about her results. Denies any B symptoms. Denies any nausea, vomiting, or diarrhea. Denies any new pains. Had not noticed any recent bleeding such as epistaxis, hematuria or hematochezia. Denies recent chest pain on exertion, shortness of breath on minimal exertion, pre-syncopal episodes, or palpitations. Denies any numbness or tingling in hands or feet. Denies any recent fevers, infections, or recent hospitalizations. Patient reports appetite at 100% and energy level at 100%. She has no problems maintaining her weight at this time.     REVIEW OF SYSTEMS:  Review of Systems  All other systems reviewed and are negative.    PAST MEDICAL/SURGICAL HISTORY:  Past Medical History:  Diagnosis Date  . Arthritis    shorulder  . B12 deficiency   . Chronic diarrhea SECONDARY HX COLON CANCER  . Diarrhea   . History of colon cancer [facility] SYNDROME ---  COLORECTAL CANCER S/P COLECTOMY X3  LAST ONE 2007   NO RECURRENCE  . History of colon cancer 05/04/2013  . History of kidney infection 08/2013  . History of kidney stones   . History of MRSA infection 08/2013   left leg  . Hx of osteopenia   . Kidney stone   . Lynch syndrome 10/17/2010   Hereditary colon cancer  . NHL (non-Hodgkin's lymphoma) ([place]) 10/17/2010   MONITORED BY DR Tressie Stalker  . Personal history of renal cancer S/P LEFT PARTIAL NEPHRECTOMY 2007--  NO RECURRENCE   DUE  TO KIDNEY CANCER  . Right shoulder pain S/P ROTATOR CUFF REPAIR   Past Surgical History:  Procedure Laterality Date  . APPENDECTOMY  1993   W/ LAVH  . COLONOSCOPY W/ POLYPECTOMY    . CYSTOSCOPY W/ RETROGRADES  07/14/2014   Procedure: CYSTOSCOPY WITH RETROGRADE PYELOGRAM;  Surgeon: Franchot Gallo, MD;  Location: [facility];  Service: Urology;;  . Consuela Mimes W/ URETERAL STENT REMOVAL Left 07/14/2014   Procedure: CYSTOSCOPY WITH STENT REMOVAL;  Surgeon: Franchot Gallo, MD;  Location: [facility];  Service: Urology;  Laterality: Left;  . CYSTOSCOPY WITH STENT PLACEMENT  02/07/2012   Procedure: CYSTOSCOPY WITH STENT PLACEMENT;  Surgeon: Franchot Gallo, MD;  Location: [facility];  Service: Urology;  Laterality: Left;  . CYSTOSCOPY WITH STENT PLACEMENT Left 07/14/2014   Procedure: CYSTOSCOPY WITH STENT PLACEMENT;  Surgeon: Franchot Gallo, MD;  Location: [facility];  Service: Urology;  Laterality: Left;  . FLEXIBLE SIGMOIDOSCOPY  08/29/2011   Procedure: FLEXIBLE SIGMOIDOSCOPY;  Surgeon: Rogene Houston, MD;  [facility];  Service: Endoscopy;  Laterality: N/A;  930  . FLEXIBLE SIGMOIDOSCOPY N/A 09/28/2012   Procedure: FLEXIBLE SIGMOIDOSCOPY;  Surgeon: Rogene Houston, MD;  [facility];  Service: Endoscopy;  Laterality: N/A;  730  . FLEXIBLE SIGMOIDOSCOPY N/A 07/08/2014   Procedure: FLEXIBLE SIGMOIDOSCOPY;  Surgeon: Rogene Houston, MD;  [facility];  Service: Endoscopy;  Laterality: N/A;  830 - moved to 10:20 -  Ann to notify pt  . FLEXIBLE SIGMOIDOSCOPY N/A 08/11/2015   Procedure: FLEXIBLE SIGMOIDOSCOPY;  Surgeon: Rogene Houston, MD;  [facility];  Service: Endoscopy;  Laterality: N/A;  730  . FLEXIBLE SIGMOIDOSCOPY N/A 08/13/2016   Procedure: FLEXIBLE SIGMOIDOSCOPY;  Surgeon: Rogene Houston, MD;  [facility];  Service: Endoscopy;  Laterality: N/A;  730  . HEMICOLECTOMY   2001   right  . HEMICOLECTOMY  2002   left  . HOLMIUM LASER APPLICATION Left 6/65/9935   Procedure: HOLMIUM LASER LITHOTRIPSY,;  Surgeon: Franchot Gallo, MD;  Location: [facility];  Service: Urology;  Laterality: Left;  . kidney resection  2007   left partial  . LAPAROSCOPIC ASSISTED VAGINAL HYSTERECTOMY  1993   W/ BILATERAL SALPINGO-OOPHORECTOMY  . LEFT FLANK EXPLORATION W/  PARTIAL LEFT NEPHRECTOMY/ LEFT HILAR LYMPH NODE BX  04-17-2005  DR DAHLSTEDT   RENAL CELL CARCINOMA  . LITHOTRIPSY  11-22013   x2  . NEPHROLITHOTOMY Left 06/09/2014   Procedure: NEPHROLITHOTOMY PERCUTANEOUS;  Surgeon: Franchot Gallo, MD;  Location: [facility];  Service: Urology;  Laterality: Left;  with STENT  . RIGHT ROTATOR  CUFF REPAIR/   BICEP REPAIR  10-17-2011  DR SUPPLE  . SUBTOTAL COLECTOMY  2007  . URETEROSCOPY WITH HOLMIUM LASER LITHOTRIPSY Left 07/14/2014   Procedure: URETEROSCOPY  EXTRACTION OF STONES;  Surgeon: Franchot Gallo, MD;  Location: [facility];  Service: Urology;  Laterality: Left;     SOCIAL HISTORY:  Social History   Socioeconomic History  . Marital status: Divorced    Spouse name: Not on file  . Number of children: Not on file  . Years of education: Not on file  . Highest education level: Not on file  Occupational History  . Not on file  Social Needs  . Financial resource strain: Not on file  . Food insecurity:    Worry: Not on file    Inability: Not on file  . Transportation needs:    Medical: Not on file    Non-medical: Not on file  Tobacco Use  . Smoking status: Never Smoker  . Smokeless tobacco: Never Used  Substance and Sexual Activity  . Alcohol use: No  . Drug use: No  . Sexual activity: Not on file  Lifestyle  . Physical activity:    Days per week: Not on file    Minutes per session: Not on file  . Stress: Not on file  Relationships  . Social connections:    Talks on phone: Not on file    Gets together: Not on file     Attends religious service: Not on file    Active member of club or organization: Not on file    Attends meetings of clubs or organizations: Not on file    Relationship status: Not on file  . Intimate partner violence:    Fear of current or ex partner: Not on file    Emotionally abused: Not on file    Physically abused: Not on file    Forced sexual activity: Not on file  Other Topics Concern  . Not on file  Social History Narrative  . Not on file    FAMILY HISTORY:  Family History  Problem Relation Age of Onset  . Cancer Son     CURRENT MEDICATIONS:  Outpatient Encounter Medications as of 04/23/2018  Medication Sig Note  . Biotin 5000 MCG CAPS Take 5,000 mcg by mouth daily.   . cyanocobalamin (,VITAMIN  B-12,) 1000 MCG/ML injection Inject 1 mL (1,000 mcg total) into the muscle once. (Patient taking differently: Inject 1,000 mcg into the muscle every 30 (thirty) days. ) 08/07/2016: Beginning of the month  . diphenoxylate-atropine (LOMOTIL) 2.5-0.025 MG tablet TAKE 2 TABLETS BY MOUTH THREE TIMES DAILY BEFORE MEALS   . Echinacea 450 MG CAPS Take 450 mg by mouth every other day.   . furosemide (LASIX) 40 MG tablet Take 40 mg by mouth daily as needed for fluid.   Marland Kitchen ibuprofen (ADVIL,MOTRIN) 800 MG tablet Take 800 mg by mouth 2 (two) times daily as needed for pain.   Marland Kitchen Potassium Citrate 15 MEQ (1620 MG) TBCR Take 15 mEq by mouth 3 (three) times daily.    No facility-administered encounter medications on file as of 04/23/2018.     ALLERGIES:  Allergies  Allergen Reactions  . Latex Rash  . Morphine And Related Other (See Comments)    hallucinations  . Tape Rash    Certain tapes      PHYSICAL EXAM:  ECOG Performance status: 1  Vitals:   04/23/18 0800  BP: (!) 153/78  Pulse: 85  Resp: 16  Temp: 98 F (36.7 C)  SpO2: 97%   Filed Weights   04/23/18 0800  Weight: 199 lb (90.3 kg)    Physical Exam Constitutional:      Appearance: Normal appearance. She is normal weight.    Musculoskeletal: Normal range of motion.  Skin:    General: Skin is warm and dry.  Neurological:     Mental Status: She is alert and oriented to person, place, and time. Mental status is at baseline.  Psychiatric:        Mood and Affect: Mood normal.        Behavior: Behavior normal.        Thought Content: Thought content normal.        Judgment: Judgment normal.      LABORATORY DATA:  I have reviewed the labs as listed.  CBC    Component Value Date/Time   WBC 7.4 04/13/2018 0910   RBC 4.32 04/13/2018 0910   HGB 12.9 04/13/2018 0910   HCT 40.4 04/13/2018 0910   PLT 154 04/13/2018 0910   MCV 93.5 04/13/2018 0910   MCH 29.9 04/13/2018 0910   MCHC 31.9 04/13/2018 0910   RDW 12.6 04/13/2018 0910   LYMPHSABS 1.4 04/13/2018 0910   MONOABS 0.6 04/13/2018 0910   EOSABS 0.2 04/13/2018 0910   BASOSABS 0.1 04/13/2018 0910   CMP Latest Ref Rng & Units 04/13/2018 03/16/2018 09/17/2017  Glucose 70 - 99 mg/dL 133(H) 105(H) 109(H)  BUN 8 - 23 mg/dL _0 Creatinine 0.44 - 1.00 mg/dL 0.84 0.75 0.82  Sodium 135 - 145 mmol/L 141 139 143  Potassium 3.5 - 5.1 mmol/L 4.3 4.3 4.3  Chloride 98 - 111 mmol/L 108 108 108  CO2 22 - 32 mmol/L _1 Calcium 8.9 - 10.3 mg/dL 8.9 8.6(L) 9.2  Total Protein 6.5 - 8.1 g/dL 6.5 6.4(L) 7.2  Total Bilirubin 0.3 - 1.2 mg/dL 0.5 0.6 0.5  Alkaline Phos 38 - 126 U/L 57 54 72  AST 15 - 41 U/L _2 ALT 0 - 44 U/L _3 DIAGNOSTIC IMAGING:  I have independently reviewed the scans and discussed with the patient.   I have reviewed Francene Finders, NP's note and agree with the documentation.  I personally performed a face-to-face visit,  made revisions and my assessment and plan is as follows.    ASSESSMENT & PLAN:   Renal cell carcinoma 1.  Left renal cell carcinoma: -Status post left partial nephrectomy on 04/17/2005.  Pathology revealed stage T1 a clear cell carcinoma, Fuhrman nuclear grade 3/4, tumor size was 1.9 cm. - CT  abdomen pelvis on 03/20/2018 showed new 1.9 cm left upper pole kidney mass enhancing slightly concerning for recurrence.  This was seen along the partial nephrectomy site superiorly.  -No significant change in the retroperitoneal, pericaval and periaortic adenopathy, largest measuring 1.6 cm. - PET CT scan on 04/06/2018 shows 17 mm lesion in the upper pole of the left kidney with no FDG accumulation above background renal parenchyma levels.  Mild lymphadenopathy in the chest, abdomen shows low-level hypermetabolism. - MRI of the abdomen on 04/21/2018 shows enhancing and possibly partially centrally necrotic 18 mm left upper pole renal lesion suspicious for recurrent renal cell cancer near the partial nephrectomy site.  Stable retrocrural and retroperitoneal lymphadenopathy. -We discussed the results of the MRI in detail.  I have made an appointment for her to follow-up with Dr. Diona Fanti.  She will likely be planned for cryoablation by Dr. Kathlene Cote. -We will see her back in 3 months for follow-up.  2.  Lynch syndrome: - Patient has MLH1 mutation. - Status post TAH and BSO in 1993. -She follows up closely with Dr.Rehman for exams.  3.  Colon cancer: -She underwent subtotal colectomy with ileorectal anastomosis. -She has occasional diarrhea which is well controlled.  She takes Imodium as needed.  4.  Follicular non-Hodgkin's lymphoma: -Sigmoid colon resection in 2007 with 2+ lymph nodes. - She denies any fevers, night sweats or weight loss.      Orders placed this encounter:  Orders Placed This Encounter  Procedures  . CEA  . CBC with Differential/Platelet  . Comprehensive metabolic panel  . Lactate dehydrogenase      Derek Jack, MD [place] [phone]

## 2018-04-23 NOTE — Assessment & Plan Note (Signed)
1.  Left renal cell carcinoma: -Status post left partial nephrectomy on 04/17/2005.  Pathology revealed stage T1 a clear cell carcinoma, Fuhrman nuclear grade 3/4, tumor size was 1.9 cm. - CT abdomen pelvis on 03/20/2018 showed new 1.9 cm left upper pole kidney mass enhancing slightly concerning for recurrence.  This was seen along the partial nephrectomy site superiorly.  -No significant change in the retroperitoneal, pericaval and periaortic adenopathy, largest measuring 1.6 cm. - PET CT scan on 04/06/2018 shows 17 mm lesion in the upper pole of the left kidney with no FDG accumulation above background renal parenchyma levels.  Mild lymphadenopathy in the chest, abdomen shows low-level hypermetabolism. - MRI of the abdomen on 04/21/2018 shows enhancing and possibly partially centrally necrotic 18 mm left upper pole renal lesion suspicious for recurrent renal cell cancer near the partial nephrectomy site.  Stable retrocrural and retroperitoneal lymphadenopathy. -We discussed the results of the MRI in detail.  I have made an appointment for her to follow-up with Dr. Diona Fanti.  She will likely be planned for cryoablation by Dr. Kathlene Cote. -We will see her back in 3 months for follow-up.  2.  Lynch syndrome: - Patient has MLH1 mutation. - Status post TAH and BSO in 1993. -She follows up closely with Dr.Rehman for exams.  3.  Colon cancer: -She underwent subtotal colectomy with ileorectal anastomosis. -She has occasional diarrhea which is well controlled.  She takes Imodium as needed.  4.  Follicular non-Hodgkin's lymphoma: -Sigmoid colon resection in 2007 with 2+ lymph nodes. - She denies any fevers, night sweats or weight loss.

## 2018-04-27 ENCOUNTER — Other Ambulatory Visit: Payer: Self-pay | Admitting: Urology

## 2018-04-27 DIAGNOSIS — N289 Disorder of kidney and ureter, unspecified: Secondary | ICD-10-CM

## 2018-04-29 ENCOUNTER — Encounter: Payer: Self-pay | Admitting: Radiology

## 2018-04-29 ENCOUNTER — Ambulatory Visit
Admission: RE | Admit: 2018-04-29 | Discharge: 2018-04-29 | Disposition: A | Discharge status: Home / Self Care | Payer: PPO | Source: Ambulatory Visit | Attending: Urology | Admitting: Urology

## 2018-04-29 DIAGNOSIS — Z85528 Personal history of other malignant neoplasm of kidney: Secondary | ICD-10-CM | POA: Diagnosis not present

## 2018-04-29 DIAGNOSIS — Z8572 Personal history of non-Hodgkin lymphomas: Secondary | ICD-10-CM | POA: Diagnosis not present

## 2018-04-29 DIAGNOSIS — N289 Disorder of kidney and ureter, unspecified: Secondary | ICD-10-CM

## 2018-04-29 DIAGNOSIS — N2889 Other specified disorders of kidney and ureter: Secondary | ICD-10-CM | POA: Diagnosis not present

## 2018-04-29 DIAGNOSIS — Z905 Acquired absence of kidney: Secondary | ICD-10-CM | POA: Diagnosis not present

## 2018-04-29 HISTORY — PX: IR RADIOLOGIST EVAL & MGMT: IMG5224

## 2018-04-29 NOTE — Consult Note (Signed)
Chief Complaint:  History of remote renal cell carcinoma 2007 status post partial left nephrectomy.  New left renal mass  Referring Physician(s): Dahlstedt,Stephen  History of Present Illness: Nicole [name] is a 68 y.o. female with a prior history of colon cancer, non-Hodgkin's lymphoma, renal cell carcinoma.  Patient had a remote partial nephrectomy in 2007 for left renal cell carcinoma.  She has been undergoing surveillance imaging for her colon cancer annually.  In January 2020, the annual surveillance scan demonstrates development of a left upper pole solid enhancing renal mass concerning for recurrent renal cell carcinoma on the surgical site measuring up to 1.8 cm in greatest dimension.  No signs of metastatic disease or renal vein invasion.  She does have chronic stable retroperitoneal adenopathy related to lymphoma.  No current symptoms including flank pain, hematuria, dysuria, or fevers.  Overall she remains asymptomatic.  She has an excellent functional status.  Past Medical History:  Diagnosis Date  . Arthritis    shorulder  . B12 deficiency   . Chronic diarrhea SECONDARY HX COLON CANCER  . Diarrhea   . History of colon cancer [facility] SYNDROME ---  COLORECTAL CANCER S/P COLECTOMY X3  LAST ONE 2007   NO RECURRENCE  . History of colon cancer 05/04/2013  . History of kidney infection 08/2013  . History of kidney stones   . History of MRSA infection 08/2013   left leg  . Hx of osteopenia   . Kidney stone   . Lynch syndrome 10/17/2010   Hereditary colon cancer  . NHL (non-Hodgkin's lymphoma) ([place]) 10/17/2010   MONITORED BY DR Tressie Stalker  . Personal history of renal cancer S/P LEFT PARTIAL NEPHRECTOMY 2007--  NO RECURRENCE   DUE TO KIDNEY CANCER  . Right shoulder pain S/P ROTATOR CUFF REPAIR    Past Surgical History:  Procedure Laterality Date  . APPENDECTOMY  1993   W/ LAVH  . COLONOSCOPY W/ POLYPECTOMY    . CYSTOSCOPY W/ RETROGRADES  07/14/2014   Procedure:  CYSTOSCOPY WITH RETROGRADE PYELOGRAM;  Surgeon: Franchot Gallo, MD;  Location: [facility];  Service: Urology;;  . Consuela Mimes W/ URETERAL STENT REMOVAL Left 07/14/2014   Procedure: CYSTOSCOPY WITH STENT REMOVAL;  Surgeon: Franchot Gallo, MD;  Location: [facility];  Service: Urology;  Laterality: Left;  . CYSTOSCOPY WITH STENT PLACEMENT  02/07/2012   Procedure: CYSTOSCOPY WITH STENT PLACEMENT;  Surgeon: Franchot Gallo, MD;  Location: [facility];  Service: Urology;  Laterality: Left;  . CYSTOSCOPY WITH STENT PLACEMENT Left 07/14/2014   Procedure: CYSTOSCOPY WITH STENT PLACEMENT;  Surgeon: Franchot Gallo, MD;  Location: [facility];  Service: Urology;  Laterality: Left;  . FLEXIBLE SIGMOIDOSCOPY  08/29/2011   Procedure: FLEXIBLE SIGMOIDOSCOPY;  Surgeon: Rogene Houston, MD;  [facility];  Service: Endoscopy;  Laterality: N/A;  930  . FLEXIBLE SIGMOIDOSCOPY N/A 09/28/2012   Procedure: FLEXIBLE SIGMOIDOSCOPY;  Surgeon: Rogene Houston, MD;  [facility];  Service: Endoscopy;  Laterality: N/A;  730  . FLEXIBLE SIGMOIDOSCOPY N/A 07/08/2014   Procedure: FLEXIBLE SIGMOIDOSCOPY;  Surgeon: Rogene Houston, MD;  [facility];  Service: Endoscopy;  Laterality: N/A;  830 - moved to 10:20 - Ann to notify pt  . FLEXIBLE SIGMOIDOSCOPY N/A 08/11/2015   Procedure: FLEXIBLE SIGMOIDOSCOPY;  Surgeon: Rogene Houston, MD;  [facility];  Service: Endoscopy;  Laterality: N/A;  730  . FLEXIBLE SIGMOIDOSCOPY N/A 08/13/2016  Procedure: FLEXIBLE SIGMOIDOSCOPY;  Surgeon: Rogene Houston, MD;  [facility];  Service: Endoscopy;  Laterality: N/A;  730  . HEMICOLECTOMY  2001   right  . HEMICOLECTOMY  2002   left  . HOLMIUM LASER APPLICATION Left 04/01/7260   Procedure: HOLMIUM LASER LITHOTRIPSY,;  Surgeon: Franchot Gallo, MD;  Location: [facility];  Service: Urology;  Laterality: Left;   . kidney resection  2007   left partial  . LAPAROSCOPIC ASSISTED VAGINAL HYSTERECTOMY  1993   W/ BILATERAL SALPINGO-OOPHORECTOMY  . LEFT FLANK EXPLORATION W/  PARTIAL LEFT NEPHRECTOMY/ LEFT HILAR LYMPH NODE BX  04-17-2005  DR DAHLSTEDT   RENAL CELL CARCINOMA  . LITHOTRIPSY  11-22013   x2  . NEPHROLITHOTOMY Left 06/09/2014   Procedure: NEPHROLITHOTOMY PERCUTANEOUS;  Surgeon: Franchot Gallo, MD;  Location: [facility];  Service: Urology;  Laterality: Left;  with STENT  . RIGHT ROTATOR  CUFF REPAIR/   BICEP REPAIR  10-17-2011  DR SUPPLE  . SUBTOTAL COLECTOMY  2007  . URETEROSCOPY WITH HOLMIUM LASER LITHOTRIPSY Left 07/14/2014   Procedure: URETEROSCOPY  EXTRACTION OF STONES;  Surgeon: Franchot Gallo, MD;  Location: [facility];  Service: Urology;  Laterality: Left;    Allergies: Latex; Morphine and related; and Tape  Medications: Prior to Admission medications   Medication Sig Start Date End Date Taking? Authorizing Provider  Biotin 5000 MCG CAPS Take 5,000 mcg by mouth daily.    [provider]  cyanocobalamin (,VITAMIN B-12,) 1000 MCG/ML injection Inject 1 mL (1,000 mcg total) into the muscle once. Patient taking differently: Inject 1,000 mcg into the muscle every 30 (thirty) days.  10/06/13   Baird Cancer, PA-C  diphenoxylate-atropine (LOMOTIL) 2.5-0.025 MG tablet TAKE 2 TABLETS BY MOUTH THREE TIMES DAILY BEFORE MEALS 08/12/16   Baird Cancer, PA-C  Echinacea 450 MG CAPS Take 450 mg by mouth every other day.    [provider]  furosemide (LASIX) 40 MG tablet Take 40 mg by mouth daily as needed for fluid.    [provider]  ibuprofen (ADVIL,MOTRIN) 800 MG tablet Take 800 mg by mouth 2 (two) times daily as needed for pain. 08/01/16   [provider]  Potassium Citrate 15 MEQ (1620 MG) TBCR Take 15 mEq by mouth 3 (three) times daily. 08/05/16   [provider]     Family History  Problem Relation Age of Onset  . Cancer  Son     Social History   Socioeconomic History  . Marital status: Divorced    Spouse name: Not on file  . Number of children: Not on file  . Years of education: Not on file  . Highest education level: Not on file  Occupational History  . Not on file  Social Needs  . Financial resource strain: Not on file  . Food insecurity:    Worry: Not on file    Inability: Not on file  . Transportation needs:    Medical: Not on file    Non-medical: Not on file  Tobacco Use  . Smoking status: Never Smoker  . Smokeless tobacco: Never Used  Substance and Sexual Activity  . Alcohol use: No  . Drug use: No  . Sexual activity: Not on file  Lifestyle  . Physical activity:    Days per week: Not on file    Minutes per session: Not on file  . Stress: Not on file  Relationships  . Social connections:    Talks on  phone: Not on file    Gets together: Not on file    Attends religious service: Not on file    Active member of club or organization: Not on file    Attends meetings of clubs or organizations: Not on file    Relationship status: Not on file  Other Topics Concern  . Not on file  Social History Narrative  . Not on file    ECOG Status: 1 - Symptomatic but completely ambulatory  Review of Systems: A 12 point ROS discussed and pertinent positives are indicated in the HPI above.  All other systems are negative.  Review of Systems  Vital Signs: BP (!) 154/85   Pulse 82   Temp 97.9 F (36.6 C) (Oral)   Resp 16   Ht 5\' 1"  (1.549 m)   Wt 88.5 kg   SpO2 100%   BMI 36.84 kg/m   Physical Exam Constitutional:      General: She is not in acute distress.    Appearance: She is obese. She is not toxic-appearing or diaphoretic.  Eyes:     General: No scleral icterus.    Conjunctiva/sclera: Conjunctivae normal.  Cardiovascular:     Rate and Rhythm: Normal rate and regular rhythm.     Pulses: Normal pulses.     Heart sounds: Normal heart sounds.  Pulmonary:     Effort:  Pulmonary effort is normal. No respiratory distress.     Breath sounds: Normal breath sounds.  Abdominal:     General: Bowel sounds are normal.     Palpations: Abdomen is soft. There is no mass.     Tenderness: There is no abdominal tenderness.     Hernia: No hernia is present.  Skin:    General: Skin is warm and dry.  Neurological:     General: No focal deficit present.     Mental Status: She is alert.  Psychiatric:        Mood and Affect: Mood normal.     Imaging: Mr Abdomen W Wo Contrast  Result Date: 04/21/2018 CLINICAL DATA:  History of stage I colon cancer, left renal cell cancer and non-Hodgkin's lymphoma. Evaluate left renal lesions seen on recent CT scan. EXAM: MRI ABDOMEN WITHOUT AND WITH CONTRAST TECHNIQUE: Multiplanar multisequence MR imaging of the abdomen was performed both before and after the administration of intravenous contrast. CONTRAST:  7 cc Gadavist COMPARISON:  CT scan 03/20/2018. FINDINGS: Lower chest: The lung bases demonstrates small effusions. No pulmonary lesions are identified. Stable lower right axillary lymph node measuring 11 mm. The heart is normal in size. No pericardial effusion. Hepatobiliary: Diffuse fatty infiltration of the liver but no focal hepatic lesions or intrahepatic biliary dilatation. Small layering gallstones are noted in the gallbladder. No findings for acute cholecystitis. Normal caliber and course of the common bile duct. Pancreas:  No mass, inflammation or ductal dilatation. Spleen:  Normal size.  No focal lesions. Adrenals/Urinary Tract:  The adrenal glands are normal and stable. Surgical changes involving the left kidney from a prior partial nephrectomy. As demonstrated on the recent CT scan there is a small upper pole left renal lesion worrisome for recurrent renal cell cancer. It measures approximately 18 x 17.5 mm and appears to have small focus of central necrosis. Slight decreased T2 signal intensity may suggest papillary renal cell  carcinoma. Recommend correlation with original diagnosis. The right kidney is unremarkable.  No renal lesions. Stomach/Bowel: Visualized portions within the abdomen are unremarkable. Vascular/Lymphatic: Stable retrocrural and retroperitoneal lymphadenopathy.  Other:  No ascites or abdominal wall hernia. Musculoskeletal: No significant bony findings. IMPRESSION: 1. Enhancing and possibly partially centrally necrotic 18 mm left upper pole renal lesion suspicious for recurrent renal cell cancer near the partial nephrectomy site. 2. Stable retrocrural and retroperitoneal lymphadenopathy. 3. No findings for hepatic metastatic disease. 4. Cholelithiasis. Electronically Signed   By: Marijo Sanes M.D.   On: 04/21/2018 10:22   Nm Pet Image Initial (pi) Whole Body  Result Date: 04/07/2018 CLINICAL DATA:  Subsequent treatment strategy for left renal cell carcinoma, status post partial nephrectomy. Now with enlarging lesion upper pole left kidney concerning for recurrence. History of colon cancer and Hodgkin's lymphoma. EXAM: NUCLEAR MEDICINE PET WHOLE BODY TECHNIQUE: 11.3 mCi F-18 FDG was injected intravenously. Full-ring PET imaging was performed from the skull base to thigh after the radiotracer. CT data was obtained and used for attenuation correction and anatomic localization. Fasting blood glucose: 78 mg/dl COMPARISON:  CT scan 03/20/2018. PET-CT 03/17/2006. FINDINGS: Mediastinal blood pool activity: SUV max 2.5 HEAD/NECK: Cluster of low left cervical lymph nodes, towards the supraclavicular space show hypermetabolism. These lymph nodes measure up to 9 mm short axis (image 96/series 3) and demonstrate SUV max = 3.2. These are not present in 2007. Incidental CT findings: None. CHEST: 6 mm short axis left supraclavicular lymph node demonstrates low level FDG uptake with SUV max = 1.6. 10 mm short axis left subpectoral lymph node (125/3) shows low level FDG uptake. Low right axillary lymph nodes measure up to 13 mm short  axis and show SUV max = 3.5. Incidental CT findings: Atherosclerotic calcification is noted in the wall of the thoracic aorta. ABDOMEN/PELVIS: Retrocrural, upper abdominal, and mild retroperitoneal lymphadenopathy is evident. This is similar to 03/20/2018. These lymph nodes show low level hypermetabolism with SUV max = 4.2 in the left para-aortic node measuring 18 mm short axis (217/3). 17 mm lesion upper pole left kidney shows no FDG accumulation above background renal parenchymal levels. Incidental CT findings: Layering tiny calcified gallstones evident. Fatty deposition noted in the liver parenchyma. Stone debris evident in the lower pole left kidney. Status post subtotal colectomy. SKELETON: No focal hypermetabolic activity to suggest skeletal metastasis. Incidental CT findings: none EXTREMITIES: No abnormal hypermetabolic activity in the lower extremities. Incidental CT findings: none IMPRESSION: 1. 17 mm exophytic left upper pole renal lesion shows no hypermetabolism. This is a reassuring finding and while the lesion may represent a cyst complicated by proteinaceous debris or hemorrhage, low-grade or well differentiated neoplasm not excluded. As the recent CT scan was performed only after intravenous contrast administration, abdominal MRI without and with contrast recommended to better assess for enhancement within this lesion. 2. Mild lymphadenopathy in the chest and abdomen shows low level hypermetabolism. Patient has a known history of lymphoma. Previous CT reports document stability of the abdominal lymphadenopathy. No recent chest CT with which to compare. 3. Cholelithiasis. 4. Hepatic steatosis. 5.  Aortic Atherosclerois (ICD10-170.0) Electronically Signed   By: Misty Stanley M.D.   On: 04/07/2018 08:42    Labs:  CBC: Recent Labs    09/17/17 0836 03/16/18 0824 04/13/18 0910  WBC 7.8 6.5 7.4  HGB 13.8 12.6 12.9  HCT 42.6 40.0 40.4  PLT 180 154 154    COAGS: No results for input(s): INR,  APTT in the last 8760 hours.  BMP: Recent Labs    09/17/17 0836 03/16/18 0824 04/13/18 0910  NA 143 139 141  K 4.3 4.3 4.3  CL 108 108 108  CO2  25 24 26   GLUCOSE 109* 105* 133*  BUN 16 21 21   CALCIUM 9.2 8.6* 8.9  CREATININE 0.82 0.75 0.84  GFRNONAA >60 >60 >60  GFRAA >60 >60 >60    LIVER FUNCTION TESTS: Recent Labs    09/17/17 0836 03/16/18 0824 04/13/18 0910  BILITOT 0.5 0.6 0.5  AST 19 16 17   ALT 20 16 16   ALKPHOS 72 54 57  PROT 7.2 6.4* 6.5  ALBUMIN 3.9 3.6 3.5    TUMOR MARKERS: No results for input(s): AFPTM, CEA, CA199, CHROMGRNA in the last 8760 hours.  Assessment and Plan:  Previous history of left renal cell carcinoma, status post left partial nephrectomy 2007.  Interval development of a 1.8 cm left renal upper pole solid enhancing mass by CT and MRI concerning for recurrence.  The lesion size and location is amenable to percutaneous image guided cryoablation under general anesthesia.  The procedure, risk, benefits and alternatives were reviewed.  The expected goals, recovery, outcomes and surveillance were all reviewed.  She understands this may require an overnight recovery at [facility].  All questions were addressed.  She would like to proceed with scheduling the procedure.  No further imaging is necessary.  No laboratory abnormalities.  Normal creatinine measuring 0.84.  Plan: Scheduled for image guided left renal cryoablation and biopsy at [facility].  Thank you for this interesting consult.  I greatly enjoyed meeting ARIYAH SEDLACK and look forward to participating in their care.  A copy of this report was sent to the requesting provider on this date.  Electronically Signed: Greggory Keen 04/29/2018, 2:32 PM   I spent a total of  30 Minutes   in face to face in clinical consultation, greater than 50% of which was counseling/coordinating care for this patient with recurrent left renal cell carcinoma

## 2018-05-01 ENCOUNTER — Other Ambulatory Visit (HOSPITAL_COMMUNITY): Payer: Self-pay | Admitting: Interventional Radiology

## 2018-05-01 DIAGNOSIS — N2889 Other specified disorders of kidney and ureter: Secondary | ICD-10-CM

## 2018-05-04 ENCOUNTER — Inpatient Hospital Stay (HOSPITAL_COMMUNITY): Payer: PPO

## 2018-05-04 ENCOUNTER — Encounter (HOSPITAL_COMMUNITY): Payer: Self-pay

## 2018-05-04 ENCOUNTER — Other Ambulatory Visit: Payer: Self-pay

## 2018-05-04 VITALS — BP 164/86 | HR 79 | Temp 98.3°F | Resp 18

## 2018-05-04 DIAGNOSIS — C642 Malignant neoplasm of left kidney, except renal pelvis: Secondary | ICD-10-CM | POA: Diagnosis not present

## 2018-05-04 DIAGNOSIS — E538 Deficiency of other specified B group vitamins: Secondary | ICD-10-CM

## 2018-05-04 MED ORDER — CYANOCOBALAMIN 1000 MCG/ML IJ SOLN
1000.0000 ug | Freq: Once | INTRAMUSCULAR | Status: AC
  Administered 2018-05-04: 1000 ug via INTRAMUSCULAR
  Filled 2018-05-04: qty 1

## 2018-05-04 NOTE — Patient Instructions (Signed)
[facility] at [facility]  Discharge Instructions:   _______________________________________________________________  Thank you for choosing [facility] at [facility] to provide your oncology and hematology care.  To afford each patient quality time with our providers, please arrive at least 15 minutes before your scheduled appointment.  You need to re-schedule your appointment if you arrive 10 or more minutes late.  We strive to give you quality time with our providers, and arriving late affects you and other patients whose appointments are after yours.  Also, if you no show three or more times for appointments you may be dismissed from the clinic.  Again, thank you for choosing [facility] at [facility]. Our hope is that these requests will allow you access to exceptional care and in a timely manner. _______________________________________________________________  If you have questions after your visit, please contact our office at [phone] between the hours of 8:30 a.m. and 5:00 p.m. Voicemails left after 4:30 p.m. will not be returned until the following business day. _______________________________________________________________  For prescription refill requests, have your pharmacy contact our office. _______________________________________________________________  Recommendations made by the consultant and any test results will be sent to your referring physician. _______________________________________________________________ 

## 2018-05-04 NOTE — Progress Notes (Signed)
Nicole [name] presents today for injection per MD orders. B12 1,000 mcg administered SQ in left Upper Arm. Administration without incident. Patient tolerated well.  This is scheduled for every 3 weeks per orders.  Treatment given per orders. Patient tolerated it well without problems. Vitals stable and discharged home from clinic ambulatory. Follow up as scheduled.

## 2018-05-12 ENCOUNTER — Other Ambulatory Visit: Payer: Self-pay | Admitting: Physician Assistant

## 2018-05-14 ENCOUNTER — Other Ambulatory Visit (HOSPITAL_COMMUNITY): Payer: Self-pay | Admitting: *Deleted

## 2018-05-14 NOTE — Patient Instructions (Addendum)
Nicole [name]    Your procedure is scheduled on: 05-20-2018  Report to [facility] Main  Entrance  Report to RADIOLOGY at 630  AM    Call this number if you have problems the morning of surgery [phone]    Remember: Do not eat food or drink liquids :After Midnight. BRUSH YOUR TEETH MORNING OF SURGERY AND RINSE YOUR MOUTH OUT, NO CHEWING GUM CANDY OR MINTS.     Take these medicines the morning of surgery with A SIP OF WATER: NONE             You may not have any metal on your body including hair pins and              piercings  Do not wear jewelry, make-up, lotions, powders or perfumes, deodorant             Do not wear nail polish.  Do not shave  48 hours prior to surgery.              Men may shave face and neck.   Do not bring valuables to the hospital. [place].  Contacts, dentures or bridgework may not be worn into surgery.  Leave suitcase in the car. After surgery it may be brought to your room.   _____________________________________________________________________             [facility] - Preparing for Surgery Before surgery, you can play an important role.  Because skin is not sterile, your skin needs to be as free of germs as possible.  You can reduce the number of germs on your skin by washing with CHG (chlorahexidine gluconate) soap before surgery.  CHG is an antiseptic cleaner which kills germs and bonds with the skin to continue killing germs even after washing. Please DO NOT use if you have an allergy to CHG or antibacterial soaps.  If your skin becomes reddened/irritated stop using the CHG and inform your nurse when you arrive at Short Stay. Do not shave (including legs and underarms) for at least 48 hours prior to the first CHG shower.  You may shave your face/neck. Please follow these instructions carefully:  1.  Shower with CHG Soap the night before surgery and the  morning of  Surgery.  2.  If you choose to wash your hair, wash your hair first as usual with your  normal  shampoo.  3.  After you shampoo, rinse your hair and body thoroughly to remove the  shampoo.                           4.  Use CHG as you would any other liquid soap.  You can apply chg directly  to the skin and wash                       Gently with a scrungie or clean washcloth.  5.  Apply the CHG Soap to your body ONLY FROM THE NECK DOWN.   Do not use on face/ open                           Wound or open sores. Avoid contact with eyes, ears  mouth and genitals (private parts).                       Wash face,  Genitals (private parts) with your normal soap.             6.  Wash thoroughly, paying special attention to the area where your surgery  will be performed.  7.  Thoroughly rinse your body with warm water from the neck down.  8.  DO NOT shower/wash with your normal soap after using and rinsing off  the CHG Soap.                9.  Pat yourself dry with a clean towel.            10.  Wear clean pajamas.            11.  Place clean sheets on your bed the night of your first shower and do not  sleep with pets. Day of Surgery : Do not apply any lotions/deodorants the morning of surgery.  Please wear clean clothes to the hospital/surgery center.  FAILURE TO FOLLOW THESE INSTRUCTIONS MAY RESULT IN THE CANCELLATION OF YOUR SURGERY PATIENT SIGNATURE_________________________________  NURSE SIGNATURE__________________________________  ________________________________________________________________________

## 2018-05-15 ENCOUNTER — Encounter (HOSPITAL_COMMUNITY): Payer: Self-pay

## 2018-05-15 ENCOUNTER — Encounter (HOSPITAL_COMMUNITY)
Admission: RE | Admit: 2018-05-15 | Discharge: 2018-05-15 | Disposition: A | Discharge status: Home / Self Care | Payer: PPO | Source: Ambulatory Visit | Attending: Interventional Radiology | Admitting: Interventional Radiology

## 2018-05-15 ENCOUNTER — Other Ambulatory Visit: Payer: Self-pay

## 2018-05-15 DIAGNOSIS — Z01812 Encounter for preprocedural laboratory examination: Secondary | ICD-10-CM | POA: Insufficient documentation

## 2018-05-15 LAB — SURGICAL PCR SCREEN
MRSA, PCR: NEGATIVE
STAPHYLOCOCCUS AUREUS: NEGATIVE

## 2018-05-15 NOTE — Progress Notes (Signed)
Spoke with brittany in short stay all ir labs to be done morning of procedure as ordered in epic and patient reports to radiology am of procedure

## 2018-05-18 ENCOUNTER — Other Ambulatory Visit: Payer: Self-pay | Admitting: Radiology

## 2018-05-19 NOTE — Anesthesia Preprocedure Evaluation (Addendum)
Anesthesia Evaluation  Patient identified by MRN, date of birth, ID band Patient awake    Reviewed: Allergy & Precautions, H&P , NPO status , Patient's Chart, lab work & pertinent test results  History of Anesthesia Complications Negative for: history of anesthetic complications  Airway Mallampati: II  TM Distance: >3 FB Neck ROM: Full    Dental no notable dental hx. (+) Dental Advisory Given   Pulmonary neg pulmonary ROS,    Pulmonary exam normal        Cardiovascular negative cardio ROS Normal cardiovascular exam     Neuro/Psych negative neurological ROS  negative psych ROS   GI/Hepatic negative GI ROS, Neg liver ROS, Hx of Colon Ca   Endo/Other  negative endocrine ROS  Renal/GU Renal diseaseHx of Renal Cell Ca S/P partial nephrectomy  negative genitourinary   Musculoskeletal  (+) Arthritis , Osteoarthritis,    Abdominal   Peds  Hematology negative hematology ROS (+) Hx non Hodgkin's lymphoma   Anesthesia Other Findings   Reproductive/Obstetrics negative OB ROS                            Anesthesia Physical  Anesthesia Plan  ASA: III  Anesthesia Plan: General   Post-op Pain Management:    Induction: Intravenous  PONV Risk Score and Plan: 3 and Ondansetron, Dexamethasone and Diphenhydramine  Airway Management Planned: Oral ETT  Additional Equipment:   Intra-op Plan:   Post-operative Plan: Extubation in OR  Informed Consent: I have reviewed the patients History and Physical, chart, labs and discussed the procedure including the risks, benefits and alternatives for the proposed anesthesia with the patient or authorized representative who has indicated his/her understanding and acceptance.     Dental advisory given  Plan Discussed with: CRNA, Anesthesiologist and Surgeon  Anesthesia Plan Comments:        Anesthesia Quick Evaluation

## 2018-05-20 ENCOUNTER — Observation Stay (HOSPITAL_COMMUNITY)
Admission: RE | Admit: 2018-05-20 | Discharge: 2018-05-21 | Disposition: A | Discharge status: Home / Self Care | Payer: PPO | Attending: Interventional Radiology | Admitting: Interventional Radiology

## 2018-05-20 ENCOUNTER — Ambulatory Visit (HOSPITAL_COMMUNITY)
Admission: RE | Admit: 2018-05-20 | Discharge: 2018-05-20 | Disposition: A | Discharge status: Home / Self Care | Payer: PPO | Source: Ambulatory Visit | Attending: Interventional Radiology | Admitting: Interventional Radiology

## 2018-05-20 ENCOUNTER — Ambulatory Visit (HOSPITAL_COMMUNITY): Payer: PPO | Admitting: Physician Assistant

## 2018-05-20 ENCOUNTER — Other Ambulatory Visit: Payer: Self-pay | Admitting: Radiology

## 2018-05-20 ENCOUNTER — Encounter (HOSPITAL_COMMUNITY): Payer: Self-pay | Admitting: Anesthesiology

## 2018-05-20 ENCOUNTER — Encounter (HOSPITAL_COMMUNITY)
Admission: RE | Disposition: A | Discharge status: Home / Self Care | Payer: Self-pay | Source: Home / Self Care | Attending: Interventional Radiology

## 2018-05-20 ENCOUNTER — Ambulatory Visit (HOSPITAL_COMMUNITY): Payer: PPO | Admitting: Anesthesiology

## 2018-05-20 ENCOUNTER — Other Ambulatory Visit: Payer: Self-pay

## 2018-05-20 DIAGNOSIS — Z885 Allergy status to narcotic agent status: Secondary | ICD-10-CM | POA: Insufficient documentation

## 2018-05-20 DIAGNOSIS — E538 Deficiency of other specified B group vitamins: Secondary | ICD-10-CM | POA: Insufficient documentation

## 2018-05-20 DIAGNOSIS — Z85038 Personal history of other malignant neoplasm of large intestine: Secondary | ICD-10-CM | POA: Diagnosis not present

## 2018-05-20 DIAGNOSIS — Z85528 Personal history of other malignant neoplasm of kidney: Secondary | ICD-10-CM | POA: Insufficient documentation

## 2018-05-20 DIAGNOSIS — Z8572 Personal history of non-Hodgkin lymphomas: Secondary | ICD-10-CM | POA: Diagnosis not present

## 2018-05-20 DIAGNOSIS — Z01818 Encounter for other preprocedural examination: Secondary | ICD-10-CM

## 2018-05-20 DIAGNOSIS — Z79899 Other long term (current) drug therapy: Secondary | ICD-10-CM | POA: Diagnosis not present

## 2018-05-20 DIAGNOSIS — R000 Tachycardia, unspecified: Secondary | ICD-10-CM | POA: Diagnosis not present

## 2018-05-20 DIAGNOSIS — N2889 Other specified disorders of kidney and ureter: Secondary | ICD-10-CM | POA: Diagnosis not present

## 2018-05-20 DIAGNOSIS — M19019 Primary osteoarthritis, unspecified shoulder: Secondary | ICD-10-CM | POA: Diagnosis not present

## 2018-05-20 DIAGNOSIS — C642 Malignant neoplasm of left kidney, except renal pelvis: Principal | ICD-10-CM | POA: Insufficient documentation

## 2018-05-20 HISTORY — PX: RADIOLOGY WITH ANESTHESIA: SHX6223

## 2018-05-20 LAB — TYPE AND SCREEN
ABO/RH(D): A POS
Antibody Screen: NEGATIVE

## 2018-05-20 LAB — PROTIME-INR
INR: 1 (ref 0.8–1.2)
Prothrombin Time: 12.7 seconds (ref 11.4–15.2)

## 2018-05-20 LAB — CBC
HCT: 40.1 % (ref 36.0–46.0)
Hemoglobin: 12.8 g/dL (ref 12.0–15.0)
MCH: 30 pg (ref 26.0–34.0)
MCHC: 31.9 g/dL (ref 30.0–36.0)
MCV: 94.1 fL (ref 80.0–100.0)
Platelets: 151 10*3/uL (ref 150–400)
RBC: 4.26 MIL/uL (ref 3.87–5.11)
RDW: 12.9 % (ref 11.5–15.5)
WBC: 7.8 10*3/uL (ref 4.0–10.5)
nRBC: 0 % (ref 0.0–0.2)

## 2018-05-20 LAB — BASIC METABOLIC PANEL
Anion gap: 9 (ref 5–15)
BUN: 19 mg/dL (ref 8–23)
CO2: 23 mmol/L (ref 22–32)
Calcium: 9.1 mg/dL (ref 8.9–10.3)
Chloride: 107 mmol/L (ref 98–111)
Creatinine, Ser: 0.77 mg/dL (ref 0.44–1.00)
GFR calc non Af Amer: >60 mL/min (ref >60)
Glucose, Bld: 103 mg/dL — ABNORMAL HIGH (ref 70–99)
Potassium: 4.6 mmol/L (ref 3.5–5.1)
Sodium: 139 mmol/L (ref 135–145)

## 2018-05-20 LAB — APTT: aPTT: 24 seconds (ref 24–36)

## 2018-05-20 SURGERY — CT WITH ANESTHESIA
Anesthesia: General | Laterality: Left

## 2018-05-20 MED ORDER — FUROSEMIDE 40 MG PO TABS: 40.0000 mg | ORAL_TABLET | Freq: Every day | ORAL | Status: DC | PRN

## 2018-05-20 MED ORDER — FENTANYL CITRATE (PF) 250 MCG/5ML IJ SOLN
INTRAMUSCULAR | Status: AC
  Filled 2018-05-20: qty 5

## 2018-05-20 MED ORDER — DOCUSATE SODIUM 100 MG PO CAPS
100.0000 mg | ORAL_CAPSULE | Freq: Two times a day (BID) | ORAL | Status: DC
  Filled 2018-05-20 (×2): qty 1

## 2018-05-20 MED ORDER — POTASSIUM CITRATE ER 10 MEQ (1080 MG) PO TBCR
10.0000 meq | EXTENDED_RELEASE_TABLET | Freq: Three times a day (TID) | ORAL | Status: DC
  Administered 2018-05-21: 10 meq via ORAL
  Filled 2018-05-20 (×3): qty 1

## 2018-05-20 MED ORDER — PROMETHAZINE HCL 25 MG/ML IJ SOLN: 6.2500 mg | INTRAMUSCULAR | Status: DC | PRN

## 2018-05-20 MED ORDER — BIOTIN 10000 MCG PO TABS: 10000.0000 ug | ORAL_TABLET | Freq: Every day | ORAL | Status: DC

## 2018-05-20 MED ORDER — FENTANYL CITRATE (PF) 250 MCG/5ML IJ SOLN
INTRAMUSCULAR | Status: DC | PRN
  Administered 2018-05-20 (×2): 50 ug via INTRAVENOUS
  Administered 2018-05-20: 100 ug via INTRAVENOUS

## 2018-05-20 MED ORDER — SUGAMMADEX SODIUM 200 MG/2ML IV SOLN
INTRAVENOUS | Status: DC | PRN
  Administered 2018-05-20: 200 mg via INTRAVENOUS

## 2018-05-20 MED ORDER — MIDAZOLAM HCL 2 MG/2ML IJ SOLN
INTRAMUSCULAR | Status: DC | PRN
  Administered 2018-05-20 (×2): 1 mg via INTRAVENOUS

## 2018-05-20 MED ORDER — VITAMIN D 25 MCG (1000 UNIT) PO TABS
5000.0000 [IU] | ORAL_TABLET | Freq: Every day | ORAL | Status: DC
  Administered 2018-05-21: 5000 [IU] via ORAL
  Filled 2018-05-20: qty 5

## 2018-05-20 MED ORDER — LACTATED RINGERS IV BOLUS
500.0000 mL | Freq: Once | INTRAVENOUS | Status: AC
  Administered 2018-05-20: 500 mL via INTRAVENOUS

## 2018-05-20 MED ORDER — CEFAZOLIN SODIUM-DEXTROSE 2-4 GM/100ML-% IV SOLN
INTRAVENOUS | Status: AC
  Filled 2018-05-20: qty 100

## 2018-05-20 MED ORDER — VITAMIN D3 125 MCG (5000 UT) PO CAPS: 5000.0000 [IU] | ORAL_CAPSULE | Freq: Every day | ORAL | Status: DC

## 2018-05-20 MED ORDER — POTASSIUM CITRATE ER 15 MEQ (1620 MG) PO TBCR: 15.0000 | EXTENDED_RELEASE_TABLET | Freq: Three times a day (TID) | ORAL | Status: DC

## 2018-05-20 MED ORDER — ROCURONIUM BROMIDE 10 MG/ML (PF) SYRINGE
PREFILLED_SYRINGE | INTRAVENOUS | Status: DC | PRN
  Administered 2018-05-20: 10 mg via INTRAVENOUS
  Administered 2018-05-20: 70 mg via INTRAVENOUS

## 2018-05-20 MED ORDER — LACTATED RINGERS IV SOLN
INTRAVENOUS | Status: DC
  Administered 2018-05-20 (×2): via INTRAVENOUS

## 2018-05-20 MED ORDER — ONDANSETRON HCL 4 MG/2ML IJ SOLN: 4.0000 mg | Freq: Four times a day (QID) | INTRAMUSCULAR | Status: DC | PRN

## 2018-05-20 MED ORDER — LIDOCAINE 2% (20 MG/ML) 5 ML SYRINGE
INTRAMUSCULAR | Status: DC | PRN
  Administered 2018-05-20: 100 mg via INTRAVENOUS

## 2018-05-20 MED ORDER — DEXAMETHASONE SODIUM PHOSPHATE 10 MG/ML IJ SOLN
INTRAMUSCULAR | Status: DC | PRN
  Administered 2018-05-20: 10 mg via INTRAVENOUS

## 2018-05-20 MED ORDER — HYDROCODONE-ACETAMINOPHEN 5-325 MG PO TABS: 1.0000 | ORAL_TABLET | ORAL | Status: DC | PRN

## 2018-05-20 MED ORDER — SODIUM CHLORIDE 0.9 % IV SOLN
INTRAVENOUS | Status: AC
  Filled 2018-05-20: qty 1000

## 2018-05-20 MED ORDER — CEFAZOLIN SODIUM-DEXTROSE 2-4 GM/100ML-% IV SOLN
2.0000 g | Freq: Once | INTRAVENOUS | Status: AC
  Administered 2018-05-20: 2 g via INTRAVENOUS

## 2018-05-20 MED ORDER — PROPOFOL 10 MG/ML IV BOLUS
INTRAVENOUS | Status: DC | PRN
  Administered 2018-05-20: 110 mg via INTRAVENOUS

## 2018-05-20 MED ORDER — SODIUM CHLORIDE (PF) 0.9 % IJ SOLN
INTRAMUSCULAR | Status: AC
  Filled 2018-05-20: qty 150

## 2018-05-20 MED ORDER — ONDANSETRON HCL 4 MG/2ML IJ SOLN
INTRAMUSCULAR | Status: DC | PRN
  Administered 2018-05-20: 4 mg via INTRAVENOUS

## 2018-05-20 MED ORDER — MIDAZOLAM HCL 2 MG/2ML IJ SOLN
INTRAMUSCULAR | Status: AC
  Filled 2018-05-20: qty 2

## 2018-05-20 MED ORDER — FENTANYL CITRATE (PF) 100 MCG/2ML IJ SOLN
INTRAMUSCULAR | Status: AC
  Filled 2018-05-20: qty 2

## 2018-05-20 MED ORDER — DIPHENHYDRAMINE HCL 50 MG/ML IJ SOLN
INTRAMUSCULAR | Status: DC | PRN
  Administered 2018-05-20: 12.5 mg via INTRAVENOUS

## 2018-05-20 MED ORDER — FENTANYL CITRATE (PF) 100 MCG/2ML IJ SOLN: 25.0000 ug | INTRAMUSCULAR | Status: DC | PRN

## 2018-05-20 MED ORDER — EPHEDRINE SULFATE-NACL 50-0.9 MG/10ML-% IV SOSY
PREFILLED_SYRINGE | INTRAVENOUS | Status: DC | PRN
  Administered 2018-05-20 (×2): 5 mg via INTRAVENOUS
  Administered 2018-05-20 (×2): 10 mg via INTRAVENOUS
  Administered 2018-05-20: 5 mg via INTRAVENOUS

## 2018-05-20 NOTE — Procedures (Signed)
Left renal mass  S/p CT left renal cryo and bx  No comp Stable ebl min Path pending Full report in pacs

## 2018-05-20 NOTE — H&P (Signed)
Referring Physician(s): Dahlstedt,S  Supervising Physician: Daryll Brod  Patient Status:  [facility] TBA  Chief Complaint: Left renal mass   Subjective: Patient familiar to IR service from prior left nephroureteral catheter placement x2 in 2016 and recent consultation with Dr. Annamaria Boots on 04/29/2018 to discuss treatment options for possible left renal cell carcinoma recurrence.  She has a history of left renal cell carcinoma in 2007, status post partial nephrectomy. She also has a prior history of colon cancer and non-Hodgkin's lymphoma.  Recent follow-up imaging has revealed an enhancing and possibly partially necrotic 1.8 cm left upper pole renal lesion suspicious for recurrent renal cell carcinoma; also with stable retrocrural and retroperitoneal lymphadenopathy.  She was deemed an appropriate candidate for CT-guided cryoablation /possible biopsy of the left renal mass and presents today for the procedure.  She currently denies fever, headache, chest pain, dyspnea, cough, abdominal/back pain, nausea, vomiting or visible bleeding.  Past Medical History:  Diagnosis Date  . Arthritis    shorulder  . B12 deficiency   . Chronic diarrhea SECONDARY HX COLON CANCER  . History of colon cancer [facility] SYNDROME ---  COLORECTAL CANCER S/P COLECTOMY X3  LAST ONE 2007   NO RECURRENCE  . History of colon cancer 05/04/2013  . History of kidney infection 08/2013  . History of kidney stones   . History of MRSA infection 08/2013   left leg  . Hx of osteopenia   . Lynch syndrome 10/17/2010   Hereditary colon cancer  . NHL (non-Hodgkin's lymphoma) ([place]) 10/17/2010   MONITORED BY DR Tressie Stalker  . Personal history of renal cancer S/P LEFT PARTIAL NEPHRECTOMY 2007--  NO RECURRENCE   DUE TO KIDNEY CANCER  . Right shoulder pain S/P ROTATOR CUFF REPAIR   Past Surgical History:  Procedure Laterality Date  . APPENDECTOMY  1993   W/ LAVH  . COLONOSCOPY W/ POLYPECTOMY    . CYSTOSCOPY W/ RETROGRADES  07/14/2014   Procedure: CYSTOSCOPY WITH RETROGRADE PYELOGRAM;  Surgeon: Franchot Gallo, MD;  Location: [facility];  Service: Urology;;  . Consuela Mimes W/ URETERAL STENT REMOVAL Left 07/14/2014   Procedure: CYSTOSCOPY WITH STENT REMOVAL;  Surgeon: Franchot Gallo, MD;  Location: [facility];  Service: Urology;  Laterality: Left;  . CYSTOSCOPY WITH STENT PLACEMENT  02/07/2012   Procedure: CYSTOSCOPY WITH STENT PLACEMENT;  Surgeon: Franchot Gallo, MD;  Location: [facility];  Service: Urology;  Laterality: Left;  . CYSTOSCOPY WITH STENT PLACEMENT Left 07/14/2014   Procedure: CYSTOSCOPY WITH STENT PLACEMENT;  Surgeon: Franchot Gallo, MD;  Location: [facility];  Service: Urology;  Laterality: Left;  . FLEXIBLE SIGMOIDOSCOPY  08/29/2011   Procedure: FLEXIBLE SIGMOIDOSCOPY;  Surgeon: Rogene Houston, MD;  [facility];  Service: Endoscopy;  Laterality: N/A;  930  . FLEXIBLE SIGMOIDOSCOPY N/A 09/28/2012   Procedure: FLEXIBLE SIGMOIDOSCOPY;  Surgeon: Rogene Houston, MD;  [facility];  Service: Endoscopy;  Laterality: N/A;  730  . FLEXIBLE SIGMOIDOSCOPY N/A 07/08/2014   Procedure: FLEXIBLE SIGMOIDOSCOPY;  Surgeon: Rogene Houston, MD;  [facility];  Service: Endoscopy;  Laterality: N/A;  830 - moved to 10:20 - Ann to notify pt  . FLEXIBLE SIGMOIDOSCOPY N/A 08/11/2015   Procedure: FLEXIBLE SIGMOIDOSCOPY;  Surgeon: Rogene Houston, MD;  [facility];  Service: Endoscopy;  Laterality: N/A;  730  . FLEXIBLE SIGMOIDOSCOPY N/A 08/13/2016   Procedure: FLEXIBLE SIGMOIDOSCOPY;  Surgeon: Rogene Houston, MD;  [facility];  Service: Endoscopy;  Laterality: N/A;  730  . HEMICOLECTOMY  2001   right  . HEMICOLECTOMY  2002   left  . HOLMIUM LASER APPLICATION Left 7/86/7544   Procedure: HOLMIUM LASER LITHOTRIPSY,;  Surgeon: Franchot Gallo, MD;  Location: [facility];  Service: Urology;   Laterality: Left;  . IR RADIOLOGIST EVAL & MGMT  04/29/2018  . kidney resection  2007   left partial  . LAPAROSCOPIC ASSISTED VAGINAL HYSTERECTOMY  1993   W/ BILATERAL SALPINGO-OOPHORECTOMY  . LEFT FLANK EXPLORATION W/  PARTIAL LEFT NEPHRECTOMY/ LEFT HILAR LYMPH NODE BX  04-17-2005  DR DAHLSTEDT   RENAL CELL CARCINOMA  . LITHOTRIPSY  11-22013   x2  . NEPHROLITHOTOMY Left 06/09/2014   Procedure: NEPHROLITHOTOMY PERCUTANEOUS;  Surgeon: Franchot Gallo, MD;  Location[facility];  Service: Urology;  Laterality: Left;  with STENT  . RIGHT ROTATOR  CUFF REPAIR/   BICEP REPAIR  10-17-2011  DR SUPPLE  . SUBTOTAL COLECTOMY  2007  . URETEROSCOPY WITH HOLMIUM LASER LITHOTRIPSY Left 07/14/2014   Procedure: URETEROSCOPY  EXTRACTION OF STONES;  Surgeon: Franchot Gallo, MD;  Location: [facility];  Service: Urology;  Laterality: Left;      Allergies: Latex; Morphine and related; and Tape  Medications: Prior to Admission medications   Medication Sig Start Date End Date Taking? Authorizing Provider  Biotin 10000 MCG TABS Take 10,000 mcg by mouth daily.   Yes [provider]  Cholecalciferol (VITAMIN D3) 125 MCG (5000 UT) CAPS Take 5,000 Units by mouth daily.   Yes [provider]  cyanocobalamin (,VITAMIN B-12,) 1000 MCG/ML injection Inject 1 mL (1,000 mcg total) into the muscle once. Patient taking differently: Inject 1,000 mcg into the muscle every 21 ( twenty-one) days.  10/06/13  Yes Kefalas, Manon Hilding, PA-C  diphenoxylate-atropine (LOMOTIL) 2.5-0.025 MG tablet TAKE 2 TABLETS BY MOUTH THREE TIMES DAILY BEFORE MEALS Patient taking differently: Take 2 tablets by mouth 3 (three) times daily as needed for diarrhea or loose stools.  08/12/16  Yes Kefalas, Manon Hilding, PA-C  ECHINACEA PO Take 1 tablet by mouth every other day.   Yes [provider]  furosemide (LASIX) 40 MG tablet Take 40 mg by mouth daily as needed for fluid.   Yes [provider]    ibuprofen (ADVIL,MOTRIN) 800 MG tablet Take 800 mg by mouth 3 (three) times daily as needed for moderate pain.  08/01/16  Yes [provider]  Potassium Citrate 15 MEQ (1620 MG) TBCR Take 15 mEq by mouth 3 (three) times daily. 08/05/16  Yes [provider]     Vital Signs: BP 137/72   Pulse 75   Temp 97.9 F (36.6 C) (Oral)   Resp 18   Ht 5\' 1"  (1.549 m)   Wt 201 lb 9.6 oz (91.4 kg)   SpO2 97%   BMI 38.09 kg/m   Physical Exam awake, alert; chest-clear to auscultation bilaterally.  Heart with regular rate and rhythm.  Abdomen soft, positive bowel sounds, nontender.  No lower extremity edema.  Imaging: No results found.  Labs:  CBC: Recent Labs    09/17/17 0836 03/16/18 0824 04/13/18 0910  WBC 7.8 6.5 7.4  HGB 13.8 12.6 12.9  HCT 42.6 40.0 40.4  PLT 180 154 154    COAGS: No results for input(s): INR, APTT in the last 8760 hours.  BMP: Recent Labs    09/17/17 0836 03/16/18 0824 04/13/18 0910  NA 143 139 141  K 4.3 4.3  4.3  CL 108 108 108  CO2 25 24 26   GLUCOSE 109* 105* 133*  BUN 16 21 21   CALCIUM 9.2 8.6* 8.9  CREATININE 0.82 0.75 0.84  GFRNONAA >60 >60 >60  GFRAA >60 >60 >60    LIVER FUNCTION TESTS: Recent Labs    09/17/17 0836 03/16/18 0824 04/13/18 0910  BILITOT 0.5 0.6 0.5  AST 19 16 17   ALT 20 16 16   ALKPHOS 72 54 57  PROT 7.2 6.4* 6.5  ALBUMIN 3.9 3.6 3.5    Assessment and Plan: Patient with prior history of colon cancer, non-Hodgkin's lymphoma as well as left renal cell carcinoma, status post partial left nephrectomy in 2007.  She was seen in consultation by Dr. Annamaria Boots on 04/29/2018 to discuss treatment options for a newly noted left upper pole renal mass measuring approximately 1.8 cm and concerning for renal cell carcinoma recurrence.  She was deemed an appropriate candidate for CT-guided cryoablation /possible biopsy of the renal mass and presents today for the procedure.  Details/risks of procedure, including but not  limited to, internal bleeding, infection, injury to adjacent structures, anesthesia related complications discussed with patient with her understanding and consent.This procedure involves the use of X-rays and because of the nature of the planned procedure, it is possible that we will have prolonged use of X-ray fluoroscopy.  Potential radiation risks to you include (but are not limited to) the following: - A slightly elevated risk for cancer  several years later in life. This risk is typically less than 0.5% percent. This risk is low in comparison to the normal incidence of human cancer, which is 33% for women and 50% for men according to the [employer]. - Radiation induced injury can include skin redness, resembling a rash, tissue breakdown / ulcers and hair loss (which can be temporary or permanent).   The likelihood of either of these occurring depends on the difficulty of the procedure and whether you are sensitive to radiation due to previous procedures, disease, or genetic conditions.   IF your procedure requires a prolonged use of radiation, you will be notified and given written instructions for further action.  It is your responsibility to monitor the irradiated area for the 2 weeks following the procedure and to notify your physician if you are concerned that you have suffered a radiation induced injury.    Post procedure she will be admitted for overnight observation.   LABS PENDING  Electronically Signed: D. Rowe Robert, PA-C 05/20/2018, 8:28 AM   I spent a total of 30 minutes at the the patient's bedside AND on the patient's hospital floor or unit, greater than 50% of which was counseling/coordinating care for CT-guided cryoablation possible biopsy of left renal mass

## 2018-05-20 NOTE — Transfer of Care (Signed)
Immediate Anesthesia Transfer of Care Note  Patient: Nicole [name]  Procedure(s) Performed: CT WITH ANESTHESIA LEFT RENAL CRYOABLATION AND BIOPSY (Left )  Patient Location: PACU  Anesthesia Type:General  Level of Consciousness: awake, alert  and oriented  Airway & Oxygen Therapy: Patient Spontanous Breathing and Patient connected to face mask oxygen  Post-op Assessment: Report given to RN and Post -op Vital signs reviewed and stable  Post vital signs: Reviewed and stable  Last Vitals:  Vitals Value Taken Time  BP 162/79 05/20/2018 11:15 AM  Temp    Pulse 104 05/20/2018 11:16 AM  Resp 19 05/20/2018 11:16 AM  SpO2 100 % 05/20/2018 11:16 AM  Vitals shown include unvalidated device data.  Last Pain:  Vitals:   05/20/18 0756  TempSrc:   PainSc: 0-No pain      Patients Stated Pain Goal: 3 (88/64/84 7207)  Complications: No apparent anesthesia complications

## 2018-05-20 NOTE — Anesthesia Postprocedure Evaluation (Signed)
Anesthesia Post Note  Patient: Nicole [name]  Procedure(s) Performed: CT WITH ANESTHESIA LEFT RENAL CRYOABLATION AND BIOPSY (Left )     Patient location during evaluation: PACU Anesthesia Type: General Level of consciousness: sedated Pain management: pain level controlled Vital Signs Assessment: post-procedure vital signs reviewed and stable Respiratory status: spontaneous breathing and respiratory function stable Cardiovascular status: stable Postop Assessment: no apparent nausea or vomiting Anesthetic complications: no    Last Vitals:  Vitals:   05/20/18 1400 05/20/18 1435  BP: (!) 163/85   Pulse: (!) 110   Resp: 16   Temp: (!) 36.3 C   SpO2: 95% 93%    Last Pain:  Vitals:   05/20/18 1400  TempSrc:   PainSc: 0-No pain                 Che Below DANIEL

## 2018-05-20 NOTE — Progress Notes (Signed)
PHARMACIST - PHYSICIAN ORDER COMMUNICATION  CONCERNING: P&T Medication Policy on Herbal Medications  DESCRIPTION:  This patient's order for:  Biotin  has been noted.  This product(s) is classified as an "herbal" or natural product. Due to a lack of definitive safety studies or FDA approval, nonstandard manufacturing practices, plus the potential risk of unknown drug-drug interactions while on inpatient medications, the Pharmacy and Therapeutics Committee does not permit the use of "herbal" or natural products of this type within [facility].   ACTION TAKEN: The pharmacy department is unable to verify this order at this time and your patient has been informed of this safety policy. Please reevaluate patient's clinical condition at discharge and address if the herbal or natural product(s) should be resumed at that time.  Dia Sitter, PharmD, BCPS 05/20/2018 4:04 PM

## 2018-05-20 NOTE — Progress Notes (Signed)
Patient ID: Nicole [name], female   DOB: 1951-01-09, 68 y.o.   MRN: [id number]  S/p CT left renal mass cryoablation and biopsy  Patient is doing well after procedure and has no complaints. Patient denies abdominal pain, chest pain, shortness of breath, headache, fever, and nausea.   Blood pressure 131/81, pulse (!) 113, temperature 98.4 F (36.9 C), resp. rate 16, height 5\' 1"  (1.549 m), weight 201 lb 9.6 oz (91.4 kg), SpO2 94 %.  Physical Exam: Patient is awake and alert, resting comfortably in bed. Jugular venous distension noted; Breath sounds clear to ausculation bilaterally; patient is tachycardic but no murmurs/rubs on ausculation; abdomen is soft and non-tender to palpation with active bowel sounds; sites of probe insertion and biopsy on L flank were clean and dry with minimal dried blood present; no LE edema noted; urine yellow  Assessment and Plan: S/p CT guided left renal mass cryoablation and biopsy Admit patient for overnight observation due to tachycardia and monitor for symptoms of bleeding/infection CBC/BMP to be ordered tomorrow morning at 0500 before discharge from hospital Follow up at clinic in 4 weeks; repeat CT imaging in 3 months   Signed by:  Rowe Robert, PA-C/Maxwell Leland Johns, PA-S

## 2018-05-20 NOTE — Progress Notes (Signed)
Patient ID: Nicole [name], female   DOB: 10-02-1950, 68 y.o.   MRN: [id number]   Agree with plan for CT left renal cryo and bx  Risks and benefits discussed with the patient including, but not limited to failure to treat entire lesion, bleeding, infection, damage to adjacent structures, decrease in renal function or post procedural neuropathy.  All of the patient's questions were answered, patient is agreeable to proceed. Consent signed and in chart.

## 2018-05-20 NOTE — Anesthesia Procedure Notes (Signed)
Procedure Name: Intubation Date/Time: 05/20/2018 9:03 AM Performed by: Sharlette Dense, CRNA Patient Re-evaluated:Patient Re-evaluated prior to induction Oxygen Delivery Method: Circle system utilized Preoxygenation: Pre-oxygenation with 100% oxygen Induction Type: IV induction Ventilation: Mask ventilation without difficulty and Oral airway inserted - appropriate to patient size Laryngoscope Size: Sabra Heck and 2 Grade View: Grade I Tube type: Oral Tube size: 7.5 mm Number of attempts: 1 Airway Equipment and Method: Stylet Placement Confirmation: ETT inserted through vocal cords under direct vision,  positive ETCO2 and breath sounds checked- equal and bilateral Secured at: 22 cm Tube secured with: Tape Dental Injury: Teeth and Oropharynx as per pre-operative assessment

## 2018-05-21 ENCOUNTER — Encounter (HOSPITAL_COMMUNITY): Payer: Self-pay | Admitting: Interventional Radiology

## 2018-05-21 DIAGNOSIS — Z85528 Personal history of other malignant neoplasm of kidney: Secondary | ICD-10-CM | POA: Diagnosis not present

## 2018-05-21 DIAGNOSIS — N2889 Other specified disorders of kidney and ureter: Secondary | ICD-10-CM | POA: Diagnosis not present

## 2018-05-21 DIAGNOSIS — Z8572 Personal history of non-Hodgkin lymphomas: Secondary | ICD-10-CM | POA: Diagnosis not present

## 2018-05-21 DIAGNOSIS — C642 Malignant neoplasm of left kidney, except renal pelvis: Secondary | ICD-10-CM | POA: Diagnosis not present

## 2018-05-21 LAB — CBC WITH DIFFERENTIAL/PLATELET
Abs Immature Granulocytes: 0.08 10*3/uL — ABNORMAL HIGH (ref 0.00–0.07)
Basophils Absolute: 0 10*3/uL (ref 0.0–0.1)
Basophils Relative: 0 %
Eosinophils Absolute: 0 10*3/uL (ref 0.0–0.5)
Eosinophils Relative: 0 %
HCT: 38.3 % (ref 36.0–46.0)
Hemoglobin: 12 g/dL (ref 12.0–15.0)
Immature Granulocytes: 1 %
LYMPHS ABS: 1 10*3/uL (ref 0.7–4.0)
Lymphocytes Relative: 9 %
MCH: 29.9 pg (ref 26.0–34.0)
MCHC: 31.3 g/dL (ref 30.0–36.0)
MCV: 95.3 fL (ref 80.0–100.0)
Monocytes Absolute: 1.2 10*3/uL — ABNORMAL HIGH (ref 0.1–1.0)
Monocytes Relative: 10 %
NRBC: 0 % (ref 0.0–0.2)
Neutro Abs: 8.8 10*3/uL — ABNORMAL HIGH (ref 1.7–7.7)
Neutrophils Relative %: 80 %
Platelets: 157 10*3/uL (ref 150–400)
RBC: 4.02 MIL/uL (ref 3.87–5.11)
RDW: 12.9 % (ref 11.5–15.5)
WBC: 11.1 10*3/uL — ABNORMAL HIGH (ref 4.0–10.5)

## 2018-05-21 LAB — BASIC METABOLIC PANEL
Anion gap: 8 (ref 5–15)
BUN: 17 mg/dL (ref 8–23)
CO2: 23 mmol/L (ref 22–32)
Calcium: 8.4 mg/dL — ABNORMAL LOW (ref 8.9–10.3)
Chloride: 107 mmol/L (ref 98–111)
Creatinine, Ser: 0.74 mg/dL (ref 0.44–1.00)
GFR calc non Af Amer: >60 mL/min (ref >60)
Glucose, Bld: 130 mg/dL — ABNORMAL HIGH (ref 70–99)
Potassium: 4.2 mmol/L (ref 3.5–5.1)
Sodium: 138 mmol/L (ref 135–145)

## 2018-05-21 NOTE — Progress Notes (Signed)
Patient was given discharge instructions with no immediate questions or concerns. The patient was given the option to be taken downstairs for wheelchair for discharge, but the patient refused and ambulated down stairs for discharge.

## 2018-05-21 NOTE — Discharge Summary (Signed)
Patient ID: Nicole [name] MRN: [id number] DOB/AGE: [birth year] 68 y.o.  Admit date: 05/20/2018 Discharge date: 05/21/2018  Supervising Physician: Markus Daft  Patient Status: [facility] - In-pt  Admission Diagnoses: Left renal mass Renal mass, left  Discharge Diagnoses:  Active Problems:   Left renal mass   Renal mass, left   Discharged Condition: stable  Hospital Course:  Patient presented to [facility] 05/20/2018 for an image-guided left renal mass biopsy and cryoablation with Dr. Annamaria Boots. Procedure occurred without major complications and patient was transferred to floor in stable condition (VSS) for overnight observation. No major events occurred overnight.  Patient awake and alert laying in bed with no complaints at this time. Left flank incisions stable. Plan to discharge home today and follow-up with Dr. Annamaria Boots in clinic 4 weeks after discharge.   Consults: None  Significant Diagnostic Studies: Ct Guide Tissue Ablation  Result Date: 05/20/2018 INDICATION: History of left renal cell carcinoma, status post remote partial nephrectomy. New left upper pole enhancing 2 cm mass at the nephrectomy superior margin concerning for local recurrence of renal cell carcinoma. EXAM: CT-GUIDED LEFT RENAL MASS 18 GAUGE CORE BIOPSY CT-GUIDED PERCUTANEOUS CRYOABLATION OF THE 2 CM LEFT UPPER POLE RENAL MASS ANESTHESIA/SEDATION: General MEDICATIONS: Ancef 2 g administered within 1 hour of the procedure CONTRAST:  50 CC OMNIPAQUE 300 PROCEDURE: The procedure, risks, benefits, and alternatives were explained to the patient. Questions regarding the procedure were encouraged and answered. The patient understands and consents to the procedure. The patient was placed under general anesthesia. Initial unenhanced CT was performed in a PRONE position to localize the 2 cm left upper pole renal mass. Overlying skin marked for a posterior oblique approach. The patient was prepped with ChloraPrep in a sterile fashion, and a  sterile drape was applied covering the operative field. A sterile gown and sterile gloves were used for the procedure. Under CT guidance, a 2 17 gauge ice pearl 2.1 CX percutaneous cryoablation probes were advanced into the left upper pole slightly exophytic renal mass. Probe positioning was confirmed by CT prior to cryoablation. After initial cryoablation probe insertions, a 17 gauge coaxial guide needle was advanced to the left renal mass. Needle position also confirmed with CT. A single 18 gauge core biopsy was obtained. This was placed in formalin. Biopsy access needle removed. Following biopsy, repeat imaging confirms stable position of the ablation needles. No surrounding vital structures that warrant hydro dissection. Cryoablation was performed through the 2 cryo ablation needles. Initial 10 minute cycle of cryoablation was performed. This was followed by a 6 minute thaw cycle. A second 10 minute cycle of cryoablation was then performed. During ablation, periodic CT imaging was performed to monitor ice ball formation and morphology. After active thaw, the cryoablation probes were removed. Post-procedural CT was performed. COMPLICATIONS: None immediate. FINDINGS: CT imaging confirms insertion of 2 cryo ablation needles within the left upper pole renal mass. 39 gauge core biopsy also performed prior to the ablation. Surveillance imaging during the ablation confirms adequate " ice ball " coverage of the left upper pole renal mass. IMPRESSION: Successful CT-guided core biopsy and cryoablation of the left upper pole renal mass as above. . The patient will be observed overnight. Initial follow-up will be performed in approximately 4 weeks. Electronically Signed   By: Jerilynn Mages.  Shick M.D.   On: 05/20/2018 11:54   Ct Biopsy  Result Date: 05/20/2018 INDICATION: History of left renal cell carcinoma, status post remote partial nephrectomy. New left upper pole enhancing 2 cm mass  at the nephrectomy superior margin  concerning for local recurrence of renal cell carcinoma. EXAM: CT-GUIDED LEFT RENAL MASS 18 GAUGE CORE BIOPSY CT-GUIDED PERCUTANEOUS CRYOABLATION OF THE 2 CM LEFT UPPER POLE RENAL MASS ANESTHESIA/SEDATION: General MEDICATIONS: Ancef 2 g administered within 1 hour of the procedure CONTRAST:  50 CC OMNIPAQUE 300 PROCEDURE: The procedure, risks, benefits, and alternatives were explained to the patient. Questions regarding the procedure were encouraged and answered. The patient understands and consents to the procedure. The patient was placed under general anesthesia. Initial unenhanced CT was performed in a PRONE position to localize the 2 cm left upper pole renal mass. Overlying skin marked for a posterior oblique approach. The patient was prepped with ChloraPrep in a sterile fashion, and a sterile drape was applied covering the operative field. A sterile gown and sterile gloves were used for the procedure. Under CT guidance, a 2 17 gauge ice pearl 2.1 CX percutaneous cryoablation probes were advanced into the left upper pole slightly exophytic renal mass. Probe positioning was confirmed by CT prior to cryoablation. After initial cryoablation probe insertions, a 17 gauge coaxial guide needle was advanced to the left renal mass. Needle position also confirmed with CT. A single 18 gauge core biopsy was obtained. This was placed in formalin. Biopsy access needle removed. Following biopsy, repeat imaging confirms stable position of the ablation needles. No surrounding vital structures that warrant hydro dissection. Cryoablation was performed through the 2 cryo ablation needles. Initial 10 minute cycle of cryoablation was performed. This was followed by a 6 minute thaw cycle. A second 10 minute cycle of cryoablation was then performed. During ablation, periodic CT imaging was performed to monitor ice ball formation and morphology. After active thaw, the cryoablation probes were removed. Post-procedural CT was performed.  COMPLICATIONS: None immediate. FINDINGS: CT imaging confirms insertion of 2 cryo ablation needles within the left upper pole renal mass. 63 gauge core biopsy also performed prior to the ablation. Surveillance imaging during the ablation confirms adequate " ice ball " coverage of the left upper pole renal mass. IMPRESSION: Successful CT-guided core biopsy and cryoablation of the left upper pole renal mass as above. . The patient will be observed overnight. Initial follow-up will be performed in approximately 4 weeks. Electronically Signed   By: Jerilynn Mages.  Shick M.D.   On: 05/20/2018 11:54   [facility] Radiologist Eval & Mgmt  Result Date: 04/29/2018 Please refer to notes tab for details about interventional procedure. (Op Note)   Treatments: Cryoablation of left renal mass  Discharge Exam: Blood pressure 131/81, pulse 94, temperature 98.4 F (36.9 C), resp. rate 16, height 5\' 1"  (1.549 m), weight 206 lb 5.6 oz (93.6 kg), SpO2 96 %. Physical Exam Vitals signs and nursing note reviewed.  Constitutional:      General: She is not in acute distress.    Appearance: Normal appearance.  Cardiovascular:     Rate and Rhythm: Normal rate and regular rhythm.     Heart sounds: Normal heart sounds. No murmur.  Pulmonary:     Effort: Pulmonary effort is normal. No respiratory distress.     Breath sounds: Normal breath sounds. No wheezing.  Skin:    General: Skin is warm and dry.     Comments: Left flank incisions without tenderness, erythema, or drainage.  Neurological:     Mental Status: She is alert and oriented to person, place, and time.  Psychiatric:        Mood and Affect: Mood normal.  Behavior: Behavior normal.        Thought Content: Thought content normal.        Judgment: Judgment normal.     Disposition: Discharge disposition: 01-Home or Self Care       Discharge Instructions    Call MD for:  difficulty breathing, headache or visual disturbances   Complete by:  As directed    Call MD  for:  extreme fatigue   Complete by:  As directed    Call MD for:  hives   Complete by:  As directed    Call MD for:  persistant dizziness or light-headedness   Complete by:  As directed    Call MD for:  persistant nausea and vomiting   Complete by:  As directed    Call MD for:  redness, tenderness, or signs of infection (pain, swelling, redness, odor or green/yellow discharge around incision site)   Complete by:  As directed    Call MD for:  severe uncontrolled pain   Complete by:  As directed    Call MD for:  temperature >100.4   Complete by:  As directed    Diet - low sodium heart healthy   Complete by:  As directed    Discharge wound care:   Complete by:  As directed    Can shower today. Shower with dressing on, remove dressing and pat dry. No need for additional dressings following this. No submerging (bathing, swimming) for 48 hours.   Increase activity slowly   Complete by:  As directed      Allergies as of 05/21/2018      Reactions   Latex Rash   Morphine And Related Other (See Comments)   hallucinations   Tape Rash   Certain tapes       Medication List    TAKE these medications   Biotin 10000 MCG Tabs Take 10,000 mcg by mouth daily.   cyanocobalamin 1000 MCG/ML injection Commonly known as:  (VITAMIN B-12) Inject 1 mL (1,000 mcg total) into the muscle once. What changed:  when to take this   diphenoxylate-atropine 2.5-0.025 MG tablet Commonly known as:  LOMOTIL TAKE 2 TABLETS BY MOUTH THREE TIMES DAILY BEFORE MEALS What changed:  See the new instructions.   ECHINACEA PO Take 1 tablet by mouth every other day.   furosemide 40 MG tablet Commonly known as:  LASIX Take 40 mg by mouth daily as needed for fluid.   ibuprofen 800 MG tablet Commonly known as:  ADVIL,MOTRIN Take 800 mg by mouth 3 (three) times daily as needed for moderate pain.   Potassium Citrate 15 MEQ (1620 MG) Tbcr Take 15 mEq by mouth 3 (three) times daily.   Vitamin D3 125 MCG (5000 UT)  Caps Take 5,000 Units by mouth daily.            Discharge Care Instructions  (From admission, onward)         Start     Ordered   05/21/18 0000  Discharge wound care:    Comments:  Can shower today. Shower with dressing on, remove dressing and pat dry. No need for additional dressings following this. No submerging (bathing, swimming) for 48 hours.   05/21/18 [id number]            Electronically Signed: Earley Abide, PA-C 05/21/2018, 9:16 AM   I have spent Less Than 30 Minutes discharging [place].

## 2018-05-21 NOTE — Discharge Instructions (Signed)
Cryoablation, Care After  This sheet gives you information about how to care for yourself after your procedure. Your health care provider may also give you more specific instructions. If you have problems or questions, contact your health care provider.  What can I expect after the procedure?  After the procedure, it is common to have:   Soreness around the treatment area.   Mild pain and swelling in the treatment area.  Follow these instructions at home:  Treatment area care     Follow instructions from your health care provider about how to take care of your incision. Make sure you:  ? Wash your hands with soap and water before you change your bandage (dressing). If soap and water are not available, use hand sanitizer.  ? Change your dressing as told by your health care provider.  ? Leave stitches (sutures) in place. They may need to stay in place for 2 weeks or longer.   Check your treatment area every day for signs of infection. Check for:  ? More redness, swelling, or pain.  ? More fluid or blood.  ? Warmth.  ? Pus or a bad smell.   Keep the treated area clean, dry, and covered with a dressing until it has healed. Clean the area with soap and water or as told by your health care provider.   You may shower if your health care provider approves. If your bandage gets wet, change it right away.  Activity   Follow instructions from your health care provider about any activity limitations.   Do not drive for 24 hours if you received a medicine to help you relax (sedative).  General instructions   Take over-the-counter and prescription medicines only as told by your health care provider.   Keep all follow-up visits as told by your health care provider. This is important.  Contact a health care provider if:   You do not have a bowel movement for 2 days.   You have nausea or vomiting.   You have more redness, swelling, or pain around your treatment area.   You have more fluid or blood coming from your  treatment area.   Your treatment area feels warm to the touch.   You have pus or a bad smell coming from your treatment area.   You have a fever.  Get help right away if:   You have severe pain.   You have trouble swallowing or breathing.   You have severe weakness or dizziness.   You have chest pain or shortness of breath.  This information is not intended to replace advice given to you by your health care provider. Make sure you discuss any questions you have with your health care provider.  Document Released: 12/23/2012 Document Revised: 09/22/2015 Document Reviewed: 08/02/2015  Elsevier Interactive Patient Education  2019 [employer]

## 2018-05-25 ENCOUNTER — Ambulatory Visit (HOSPITAL_COMMUNITY): Payer: PPO

## 2018-05-26 ENCOUNTER — Other Ambulatory Visit: Payer: Self-pay

## 2018-05-26 ENCOUNTER — Inpatient Hospital Stay (HOSPITAL_COMMUNITY): Payer: PPO | Attending: Oncology

## 2018-05-26 ENCOUNTER — Encounter (HOSPITAL_COMMUNITY): Payer: Self-pay

## 2018-05-26 VITALS — BP 149/73 | HR 78 | Temp 98.8°F | Resp 18

## 2018-05-26 DIAGNOSIS — E538 Deficiency of other specified B group vitamins: Secondary | ICD-10-CM | POA: Diagnosis not present

## 2018-05-26 DIAGNOSIS — Z79899 Other long term (current) drug therapy: Secondary | ICD-10-CM | POA: Insufficient documentation

## 2018-05-26 DIAGNOSIS — C642 Malignant neoplasm of left kidney, except renal pelvis: Secondary | ICD-10-CM | POA: Insufficient documentation

## 2018-05-26 MED ORDER — CYANOCOBALAMIN 1000 MCG/ML IJ SOLN
INTRAMUSCULAR | Status: AC
  Filled 2018-05-26: qty 1

## 2018-05-26 MED ORDER — CYANOCOBALAMIN 1000 MCG/ML IJ SOLN
1000.0000 ug | Freq: Once | INTRAMUSCULAR | Status: AC
  Administered 2018-05-26: 1000 ug via INTRAMUSCULAR

## 2018-05-26 NOTE — Progress Notes (Signed)
Nicole [name] tolerated Vit B12 injection well without complaints or incident. VSS Pt discharged self ambulatory in satisfactory condition

## 2018-05-26 NOTE — Patient Instructions (Signed)
[facility] at [facility] Discharge Instructions  Received Vit B12 injection today. Follow-up as scheduled. Call clinic for any questions or concerns   Thank you for choosing [facility] at [facility] to provide your oncology and hematology care.  To afford each patient quality time with our provider, please arrive at least 15 minutes before your scheduled appointment time.   If you have a lab appointment with the Cancer Center please come in thru the  Main Entrance and check in at the main information desk  You need to re-schedule your appointment should you arrive 10 or more minutes late.  We strive to give you quality time with our providers, and arriving late affects you and other patients whose appointments are after yours.  Also, if you no show three or more times for appointments you may be dismissed from the clinic at the providers discretion.     Again, thank you for choosing [facility].  Our hope is that these requests will decrease the amount of time that you wait before being seen by our physicians.       _____________________________________________________________  Should you have questions after your visit to [facility], please contact our office at [phone] between the hours of 8:00 a.m. and 4:30 p.m.  Voicemails left after 4:00 p.m. will not be returned until the following business day.  For prescription refill requests, have your pharmacy contact our office and allow 72 hours.    Cancer Center Support Programs:   > Cancer Support Group  2nd Tuesday of the month 1pm-2pm, Journey Room   

## 2018-06-08 ENCOUNTER — Other Ambulatory Visit (HOSPITAL_COMMUNITY): Payer: Self-pay | Admitting: Family Medicine

## 2018-06-08 DIAGNOSIS — Z1231 Encounter for screening mammogram for malignant neoplasm of breast: Secondary | ICD-10-CM

## 2018-06-15 ENCOUNTER — Ambulatory Visit (HOSPITAL_COMMUNITY): Payer: PPO

## 2018-06-16 ENCOUNTER — Other Ambulatory Visit: Payer: PPO

## 2018-06-25 ENCOUNTER — Telehealth (HOSPITAL_COMMUNITY): Payer: Self-pay | Admitting: *Deleted

## 2018-06-29 ENCOUNTER — Other Ambulatory Visit (HOSPITAL_COMMUNITY): Payer: Self-pay | Admitting: *Deleted

## 2018-06-29 DIAGNOSIS — E538 Deficiency of other specified B group vitamins: Secondary | ICD-10-CM

## 2018-06-29 MED ORDER — CYANOCOBALAMIN 1000 MCG/ML IJ SOLN: 1000.0000 ug | INTRAMUSCULAR | 17 refills | Status: DC

## 2018-06-29 NOTE — Telephone Encounter (Signed)
Patient called clinic wanting to know if she can give herself the B-12 injections. She states that she has been taught before how to do it and actually used to give them to herself.  I have sent a refill in to her pharmacy for the injection and she knows to continue taking it IM injection every 21 days.

## 2018-07-06 ENCOUNTER — Ambulatory Visit (HOSPITAL_COMMUNITY): Payer: PPO

## 2018-07-20 ENCOUNTER — Other Ambulatory Visit (HOSPITAL_COMMUNITY): Payer: PPO

## 2018-07-21 ENCOUNTER — Ambulatory Visit (HOSPITAL_COMMUNITY): Payer: PPO

## 2018-07-27 ENCOUNTER — Ambulatory Visit (HOSPITAL_COMMUNITY): Payer: PPO

## 2018-07-27 ENCOUNTER — Other Ambulatory Visit (HOSPITAL_COMMUNITY): Payer: PPO

## 2018-07-27 ENCOUNTER — Ambulatory Visit (HOSPITAL_COMMUNITY): Payer: PPO | Admitting: Hematology

## 2018-08-03 ENCOUNTER — Other Ambulatory Visit: Payer: Self-pay

## 2018-08-03 ENCOUNTER — Ambulatory Visit (HOSPITAL_COMMUNITY)
Admission: RE | Admit: 2018-08-03 | Discharge: 2018-08-03 | Disposition: A | Discharge status: Home / Self Care | Payer: PPO | Source: Ambulatory Visit | Attending: Family Medicine | Admitting: Family Medicine

## 2018-08-03 DIAGNOSIS — Z1231 Encounter for screening mammogram for malignant neoplasm of breast: Secondary | ICD-10-CM | POA: Diagnosis not present

## 2018-08-04 DIAGNOSIS — C642 Malignant neoplasm of left kidney, except renal pelvis: Secondary | ICD-10-CM | POA: Diagnosis not present

## 2018-08-04 DIAGNOSIS — E538 Deficiency of other specified B group vitamins: Secondary | ICD-10-CM | POA: Diagnosis not present

## 2018-08-04 DIAGNOSIS — Z1509 Genetic susceptibility to other malignant neoplasm: Secondary | ICD-10-CM | POA: Diagnosis not present

## 2018-08-04 DIAGNOSIS — Z85038 Personal history of other malignant neoplasm of large intestine: Secondary | ICD-10-CM | POA: Diagnosis not present

## 2018-08-28 ENCOUNTER — Inpatient Hospital Stay (HOSPITAL_COMMUNITY): Payer: PPO | Attending: Oncology

## 2018-08-28 ENCOUNTER — Other Ambulatory Visit: Payer: Self-pay

## 2018-08-28 DIAGNOSIS — Z79899 Other long term (current) drug therapy: Secondary | ICD-10-CM | POA: Insufficient documentation

## 2018-08-28 DIAGNOSIS — R000 Tachycardia, unspecified: Secondary | ICD-10-CM | POA: Diagnosis not present

## 2018-08-28 DIAGNOSIS — R197 Diarrhea, unspecified: Secondary | ICD-10-CM | POA: Diagnosis not present

## 2018-08-28 DIAGNOSIS — Z1509 Genetic susceptibility to other malignant neoplasm: Secondary | ICD-10-CM | POA: Insufficient documentation

## 2018-08-28 DIAGNOSIS — E538 Deficiency of other specified B group vitamins: Secondary | ICD-10-CM | POA: Insufficient documentation

## 2018-08-28 DIAGNOSIS — Z8572 Personal history of non-Hodgkin lymphomas: Secondary | ICD-10-CM | POA: Diagnosis not present

## 2018-08-28 DIAGNOSIS — C642 Malignant neoplasm of left kidney, except renal pelvis: Secondary | ICD-10-CM | POA: Insufficient documentation

## 2018-08-28 DIAGNOSIS — M8580 Other specified disorders of bone density and structure, unspecified site: Secondary | ICD-10-CM | POA: Diagnosis not present

## 2018-08-28 DIAGNOSIS — M129 Arthropathy, unspecified: Secondary | ICD-10-CM | POA: Diagnosis not present

## 2018-08-28 DIAGNOSIS — Z8614 Personal history of Methicillin resistant Staphylococcus aureus infection: Secondary | ICD-10-CM | POA: Insufficient documentation

## 2018-08-28 DIAGNOSIS — Z87442 Personal history of urinary calculi: Secondary | ICD-10-CM | POA: Insufficient documentation

## 2018-08-28 DIAGNOSIS — Z85038 Personal history of other malignant neoplasm of large intestine: Secondary | ICD-10-CM | POA: Diagnosis not present

## 2018-08-28 LAB — CBC WITH DIFFERENTIAL/PLATELET
Abs Immature Granulocytes: 0.06 10*3/uL (ref 0.00–0.07)
Basophils Absolute: 0.1 10*3/uL (ref 0.0–0.1)
Basophils Relative: 2 %
Eosinophils Absolute: 0.2 10*3/uL (ref 0.0–0.5)
Eosinophils Relative: 3 %
HCT: 40.9 % (ref 36.0–46.0)
Hemoglobin: 13.3 g/dL (ref 12.0–15.0)
Immature Granulocytes: 1 %
Lymphocytes Relative: 20 %
Lymphs Abs: 1.7 10*3/uL (ref 0.7–4.0)
MCH: 30.2 pg (ref 26.0–34.0)
MCHC: 32.5 g/dL (ref 30.0–36.0)
MCV: 92.7 fL (ref 80.0–100.0)
Monocytes Absolute: 0.9 10*3/uL (ref 0.1–1.0)
Monocytes Relative: 11 %
Neutro Abs: 5.3 10*3/uL (ref 1.7–7.7)
Neutrophils Relative %: 63 %
Platelets: 166 10*3/uL (ref 150–400)
RBC: 4.41 MIL/uL (ref 3.87–5.11)
RDW: 12.8 % (ref 11.5–15.5)
WBC: 8.2 10*3/uL (ref 4.0–10.5)
nRBC: 0 % (ref 0.0–0.2)

## 2018-08-28 LAB — COMPREHENSIVE METABOLIC PANEL
ALT: 19 U/L (ref 0–44)
AST: 19 U/L (ref 15–41)
Albumin: 3.9 g/dL (ref 3.5–5.0)
Alkaline Phosphatase: 69 U/L (ref 38–126)
Anion gap: 8 (ref 5–15)
BUN: 22 mg/dL (ref 8–23)
CO2: 25 mmol/L (ref 22–32)
Calcium: 8.9 mg/dL (ref 8.9–10.3)
Chloride: 104 mmol/L (ref 98–111)
Creatinine, Ser: 0.8 mg/dL (ref 0.44–1.00)
GFR calc Af Amer: >60 mL/min (ref >60)
GFR calc non Af Amer: >60 mL/min (ref >60)
Glucose, Bld: 145 mg/dL — ABNORMAL HIGH (ref 70–99)
Potassium: 4.1 mmol/L (ref 3.5–5.1)
Sodium: 137 mmol/L (ref 135–145)
Total Bilirubin: 0.7 mg/dL (ref 0.3–1.2)
Total Protein: 7 g/dL (ref 6.5–8.1)

## 2018-08-28 LAB — LACTATE DEHYDROGENASE: LDH: 135 U/L (ref 98–192)

## 2018-08-29 LAB — CEA: CEA: 2.5 ng/mL (ref 0.0–4.7)

## 2018-08-31 ENCOUNTER — Other Ambulatory Visit: Payer: Self-pay

## 2018-08-31 ENCOUNTER — Encounter (HOSPITAL_COMMUNITY): Payer: Self-pay | Admitting: Hematology

## 2018-08-31 ENCOUNTER — Inpatient Hospital Stay (HOSPITAL_BASED_OUTPATIENT_CLINIC_OR_DEPARTMENT_OTHER): Payer: PPO | Admitting: Hematology

## 2018-08-31 VITALS — BP 151/86 | HR 78 | Temp 98.1°F | Resp 18 | Wt 209.0 lb

## 2018-08-31 DIAGNOSIS — Z85038 Personal history of other malignant neoplasm of large intestine: Secondary | ICD-10-CM

## 2018-08-31 DIAGNOSIS — Z8614 Personal history of Methicillin resistant Staphylococcus aureus infection: Secondary | ICD-10-CM

## 2018-08-31 DIAGNOSIS — Z1509 Genetic susceptibility to other malignant neoplasm: Secondary | ICD-10-CM

## 2018-08-31 DIAGNOSIS — Z79899 Other long term (current) drug therapy: Secondary | ICD-10-CM

## 2018-08-31 DIAGNOSIS — R000 Tachycardia, unspecified: Secondary | ICD-10-CM | POA: Diagnosis not present

## 2018-08-31 DIAGNOSIS — M129 Arthropathy, unspecified: Secondary | ICD-10-CM | POA: Diagnosis not present

## 2018-08-31 DIAGNOSIS — Z8572 Personal history of non-Hodgkin lymphomas: Secondary | ICD-10-CM

## 2018-08-31 DIAGNOSIS — C642 Malignant neoplasm of left kidney, except renal pelvis: Secondary | ICD-10-CM

## 2018-08-31 DIAGNOSIS — R197 Diarrhea, unspecified: Secondary | ICD-10-CM

## 2018-08-31 DIAGNOSIS — E538 Deficiency of other specified B group vitamins: Secondary | ICD-10-CM

## 2018-08-31 DIAGNOSIS — M8580 Other specified disorders of bone density and structure, unspecified site: Secondary | ICD-10-CM

## 2018-08-31 DIAGNOSIS — Z87442 Personal history of urinary calculi: Secondary | ICD-10-CM | POA: Diagnosis not present

## 2018-08-31 MED ORDER — CYANOCOBALAMIN 1000 MCG/ML IJ SOLN
1000.0000 ug | Freq: Once | INTRAMUSCULAR | Status: AC
  Administered 2018-08-31: 1000 ug via INTRAMUSCULAR

## 2018-08-31 MED ORDER — CYANOCOBALAMIN 1000 MCG/ML IJ SOLN
INTRAMUSCULAR | Status: AC
  Filled 2018-08-31: qty 1

## 2018-08-31 NOTE — Patient Instructions (Addendum)
[facility] at [facility] Discharge Instructions  You were seen today by Dr. Katragadda. He went over your recent lab results. He will see you back after your scan for follow up.   Thank you for choosing [facility] at [facility] to provide your oncology and hematology care.  To afford each patient quality time with our provider, please arrive at least 15 minutes before your scheduled appointment time.   If you have a lab appointment with the Cancer Center please come in thru the  Main Entrance and check in at the main information desk  You need to re-schedule your appointment should you arrive 10 or more minutes late.  We strive to give you quality time with our providers, and arriving late affects you and other patients whose appointments are after yours.  Also, if you no show three or more times for appointments you may be dismissed from the clinic at the providers discretion.     Again, thank you for choosing [facility].  Our hope is that these requests will decrease the amount of time that you wait before being seen by our physicians.       _____________________________________________________________  Should you have questions after your visit to [facility], please contact our office at [phone] between the hours of 8:00 a.m. and 4:30 p.m.  Voicemails left after 4:00 p.m. will not be returned until the following business day.  For prescription refill requests, have your pharmacy contact our office and allow 72 hours.    Cancer Center Support Programs:   > Cancer Support Group  2nd Tuesday of the month 1pm-2pm, Journey Room    

## 2018-08-31 NOTE — Progress Notes (Signed)
Nicole [name], [place] [address]   CLINIC:  Medical Oncology/Hematology  PCP:  Lemmie Evens, MD [place] [address] [phone]   REASON FOR VISIT: Follow-up for Lynch Syndrome, Hx of renal cancer, hx of colon cancer, hx of follicular lymphoma.  CURRENT THERAPY:Observation.   INTERVAL HISTORY:  Nicole [name] 68 y.o. female returns for follow-up of her kidney cancer.  She had left kidney cryoablation and biopsy on 05/20/2018.  She had to stay overnight because of tachycardia.  She denies any hematuria.  She denies any pains in the kidney area.  Denies any fevers, night sweats or weight loss.  Denies any change in bowel habits.  Denies any bleeding per rectum or melena.  No new pains were reported.  Shortness of breath on exertion has been stable.  Numbness in the hands is also been stable.    REVIEW OF SYSTEMS:  Review of Systems  Respiratory: Positive for shortness of breath.   Neurological: Positive for numbness.  All other systems reviewed and are negative.    PAST MEDICAL/SURGICAL HISTORY:  Past Medical History:  Diagnosis Date  . Arthritis    shorulder  . B12 deficiency   . Chronic diarrhea SECONDARY HX COLON CANCER  . History of colon cancer [facility] SYNDROME ---  COLORECTAL CANCER S/P COLECTOMY X3  LAST ONE 2007   NO RECURRENCE  . History of colon cancer 05/04/2013  . History of kidney infection 08/2013  . History of kidney stones   . History of MRSA infection 08/2013   left leg  . Hx of osteopenia   . Lynch syndrome 10/17/2010   Hereditary colon cancer  . NHL (non-Hodgkin's lymphoma) ([place]) 10/17/2010   MONITORED BY DR Tressie Stalker  . Personal history of renal cancer S/P LEFT PARTIAL NEPHRECTOMY 2007--  NO RECURRENCE   DUE TO KIDNEY CANCER  . Right shoulder pain S/P ROTATOR CUFF REPAIR   Past Surgical History:  Procedure Laterality Date  . APPENDECTOMY  1993   W/ LAVH  . COLONOSCOPY W/ POLYPECTOMY    .  CYSTOSCOPY W/ RETROGRADES  07/14/2014   Procedure: CYSTOSCOPY WITH RETROGRADE PYELOGRAM;  Surgeon: Franchot Gallo, MD;  Location: [facility];  Service: Urology;;  . Consuela Mimes W/ URETERAL STENT REMOVAL Left 07/14/2014   Procedure: CYSTOSCOPY WITH STENT REMOVAL;  Surgeon: Franchot Gallo, MD;  Location: [facility];  Service: Urology;  Laterality: Left;  . CYSTOSCOPY WITH STENT PLACEMENT  02/07/2012   Procedure: CYSTOSCOPY WITH STENT PLACEMENT;  Surgeon: Franchot Gallo, MD;  Location: [facility];  Service: Urology;  Laterality: Left;  . CYSTOSCOPY WITH STENT PLACEMENT Left 07/14/2014   Procedure: CYSTOSCOPY WITH STENT PLACEMENT;  Surgeon: Franchot Gallo, MD;  Location: [facility];  Service: Urology;  Laterality: Left;  . FLEXIBLE SIGMOIDOSCOPY  08/29/2011   Procedure: FLEXIBLE SIGMOIDOSCOPY;  Surgeon: Rogene Houston, MD;  [facility];  Service: Endoscopy;  Laterality: N/A;  930  . FLEXIBLE SIGMOIDOSCOPY N/A 09/28/2012   Procedure: FLEXIBLE SIGMOIDOSCOPY;  Surgeon: Rogene Houston, MD;  [facility]: [facility];  Service: Endoscopy;  Laterality: N/A;  730  . FLEXIBLE SIGMOIDOSCOPY N/A 07/08/2014   Procedure: FLEXIBLE SIGMOIDOSCOPY;  Surgeon: Rogene Houston, MD;  [facility]: [facility];  Service: Endoscopy;  Laterality: N/A;  830 - moved to 10:20 - Ann to notify pt  . FLEXIBLE SIGMOIDOSCOPY N/A 08/11/2015   Procedure: FLEXIBLE SIGMOIDOSCOPY;  Surgeon: Rogene Houston, MD;  [facility];  Service: Endoscopy;  Laterality: N/A;  730  . FLEXIBLE SIGMOIDOSCOPY N/A 08/13/2016   Procedure: FLEXIBLE SIGMOIDOSCOPY;  Surgeon: Rogene Houston, MD;  [facility];  Service: Endoscopy;  Laterality: N/A;  730  . HEMICOLECTOMY  2001   right  . HEMICOLECTOMY  2002   left  . HOLMIUM LASER APPLICATION Left 6/96/2952   Procedure: HOLMIUM LASER LITHOTRIPSY,;  Surgeon: Franchot Gallo, MD;  Location: [facility];  Service: Urology;  Laterality: Left;  . IR RADIOLOGIST EVAL & MGMT  04/29/2018  . kidney resection  2007   left partial  . LAPAROSCOPIC ASSISTED VAGINAL HYSTERECTOMY  1993   W/ BILATERAL SALPINGO-OOPHORECTOMY  . LEFT FLANK EXPLORATION W/  PARTIAL LEFT NEPHRECTOMY/ LEFT HILAR LYMPH NODE BX  04-17-2005  DR DAHLSTEDT   RENAL CELL CARCINOMA  . LITHOTRIPSY  11-22013   x2  . NEPHROLITHOTOMY Left 06/09/2014   Procedure: NEPHROLITHOTOMY PERCUTANEOUS;  Surgeon: Franchot Gallo, MD;  Location: [facility];  Service: Urology;  Laterality: Left;  with STENT  . RADIOLOGY WITH ANESTHESIA Left 05/20/2018   Procedure: CT WITH ANESTHESIA LEFT RENAL CRYOABLATION AND BIOPSY;  Surgeon: Greggory Keen, MD;  Location: [facility];  Service: Anesthesiology;  Laterality: Left;  . RIGHT ROTATOR  CUFF REPAIR/   BICEP REPAIR  10-17-2011  DR SUPPLE  . SUBTOTAL COLECTOMY  2007  . URETEROSCOPY WITH HOLMIUM LASER LITHOTRIPSY Left 07/14/2014   Procedure: URETEROSCOPY  EXTRACTION OF STONES;  Surgeon: Franchot Gallo, MD;  Location: [facility];  Service: Urology;  Laterality: Left;     SOCIAL HISTORY:  Social History   Socioeconomic History  . Marital status: Divorced    Spouse name: Not on file  . Number of children: Not on file  . Years of education: Not on file  . Highest education level: Not on file  Occupational History  . Not on file  Social Needs  . Financial resource strain: Not on file  . Food insecurity    Worry: Not on file    Inability: Not on file  . Transportation needs    Medical: Not on file    Non-medical: Not on file  Tobacco Use  . Smoking status: Never Smoker  . Smokeless tobacco: Never Used  Substance and Sexual Activity  . Alcohol use: No  . Drug use: No  . Sexual activity: Not on file  Lifestyle  . Physical activity    Days per week: Not on file    Minutes per session: Not on file  . Stress: Not on file  Relationships  . Social Herbalist on  phone: Not on file    Gets together: Not on file    Attends religious service: Not on file    Active member of club or organization: Not on file    Attends meetings of clubs or organizations: Not on file    Relationship status: Not on file  . Intimate partner violence    Fear of current or ex partner: Not on file    Emotionally abused: Not on file    Physically abused: Not on file    Forced sexual activity: Not on file  Other Topics Concern  . Not on file  Social History Narrative  . Not on file    FAMILY HISTORY:  Family History  Problem Relation Age of Onset  . Cancer Son     CURRENT MEDICATIONS:  Outpatient Encounter Medications as of 08/31/2018  Medication Sig  . Biotin 10000 MCG TABS Take 10,000 mcg by mouth daily.  . Cholecalciferol (VITAMIN D3) 125 MCG (5000 UT) CAPS Take 5,000 Units by mouth daily.  . cyanocobalamin (,VITAMIN B-12,) 1000 MCG/ML injection Inject 1 mL (1,000 mcg total) into the muscle every 21 ( twenty-one) days.  . diphenoxylate-atropine (LOMOTIL) 2.5-0.025 MG tablet TAKE 2 TABLETS BY MOUTH THREE TIMES DAILY BEFORE MEALS (Patient taking differently: Take 2 tablets by mouth 3 (three) times daily as needed for diarrhea or loose stools. )  . ECHINACEA PO Take 1 tablet by mouth every other day.  . furosemide (LASIX) 40 MG tablet Take 40 mg by mouth daily as needed for fluid.  Marland Kitchen ibuprofen (ADVIL,MOTRIN) 800 MG tablet Take 800 mg by mouth 3 (three) times daily as needed for moderate pain.   Marland Kitchen Potassium Citrate 15 MEQ (1620 MG) TBCR Take 15 mEq by mouth 3 (three) times daily.   Facility-Administered Encounter Medications as of 08/31/2018  Medication  . cyanocobalamin ((VITAMIN B-12)) injection 1,000 mcg    ALLERGIES:  Allergies  Allergen Reactions  . Latex Rash  . Morphine And Related Other (See Comments)    hallucinations  . Tape Rash    Certain tapes      PHYSICAL EXAM:  ECOG Performance status: 1  Vitals:   08/31/18 1509  BP: (!) 151/86   Pulse: 78  Resp: 18  Temp: 98.1 F (36.7 C)  SpO2: 99%   Filed Weights   08/31/18 1509  Weight: 209 lb (94.8 kg)    Physical Exam Constitutional:      Appearance: Normal appearance. She is normal weight.  Cardiovascular:     Rate and Rhythm: Normal rate and regular rhythm.     Heart sounds: Normal heart sounds.  Pulmonary:     Effort: Pulmonary effort is normal.     Breath sounds: Normal breath sounds.  Abdominal:     General: There is no distension.     Palpations: Abdomen is soft. There is no mass.  Musculoskeletal: Normal range of motion.  Skin:    General: Skin is warm.  Neurological:     Mental Status: She is alert and oriented to person, place, and time. Mental status is at baseline.  Psychiatric:        Mood and Affect: Mood normal.        Behavior: Behavior normal.        Thought Content: Thought content normal.        Judgment: Judgment normal.      LABORATORY DATA:  I have reviewed the labs as listed.  CBC    Component Value Date/Time   WBC 8.2 08/28/2018 1158   RBC 4.41 08/28/2018 1158   HGB 13.3 08/28/2018 1158   HCT 40.9 08/28/2018 1158   PLT 166 08/28/2018 1158   MCV 92.7 08/28/2018 1158   MCH 30.2 08/28/2018 1158   MCHC 32.5 08/28/2018 1158   RDW 12.8 08/28/2018 1158   LYMPHSABS 1.7 08/28/2018 1158   MONOABS 0.9 08/28/2018 1158   EOSABS 0.2 08/28/2018 1158   BASOSABS 0.1 08/28/2018 1158   CMP Latest Ref Rng & Units 08/28/2018 05/21/2018 05/20/2018  Glucose 70 - 99 mg/dL 145(H) 130(H) 103(H)  BUN 8 - 23 mg/dL '22 17 19  '$ Creatinine 0.44 - 1.00 mg/dL 0.80 0.74 0.77  Sodium 135 - 145 mmol/L 137 138 139  Potassium 3.5 - 5.1 mmol/L 4.1 4.2 4.6  Chloride 98 - 111 mmol/L 104 107 107  CO2 22 -  32 mmol/L '25 23 23  '$ Calcium 8.9 - 10.3 mg/dL 8.9 8.4(L) 9.1  Total Protein 6.5 - 8.1 g/dL 7.0 - -  Total Bilirubin 0.3 - 1.2 mg/dL 0.7 - -  Alkaline Phos 38 - 126 U/L 69 - -  AST 15 - 41 U/L 19 - -  ALT 0 - 44 U/L 19 - -       DIAGNOSTIC IMAGING:  I  have independently reviewed the scans and discussed with the patient.      ASSESSMENT & PLAN:   Renal cell carcinoma 1.  Left renal cell carcinoma: -Status post left partial nephrectomy on 04/17/2005.  Pathology revealed stage T1 a clear cell carcinoma, Fuhrman nuclear grade 3/4, tumor size was 1.9 cm. - CT abdomen pelvis on 03/20/2018 showed new 1.9 cm left upper pole kidney mass enhancing slightly concerning for recurrence.  This was seen along the partial nephrectomy site superiorly.  -No significant change in the retroperitoneal, pericaval and periaortic adenopathy, largest measuring 1.6 cm. - PET CT scan on 04/06/2018 shows 17 mm lesion in the upper pole of the left kidney with no FDG accumulation above background renal parenchyma levels.  Mild lymphadenopathy in the chest, abdomen shows low-level hypermetabolism. - MRI of the abdomen on 04/21/2018 shows enhancing and possibly partially centrally necrotic 18 mm left upper pole renal lesion suspicious for recurrent renal cell cancer near the partial nephrectomy site.  Stable retrocrural and retroperitoneal lymphadenopathy. - She underwent left renal mass cryoablation and biopsy on 05/20/2018. -Had a prolonged discussion about the biopsy which showed clear cell renal cell carcinoma, grade 2. -I have recommended doing a CT scan of the abdomen with and without contrast to follow-up on the cryoablation.  We will see her back after the biopsy.  2.  Lynch syndrome: - Patient has MLH1 mutation. - Status post TAH and BSO in 1993. -Flexible sigmoidoscopy on 08/13/2016 shows normal ileum, end-to-end ileocolonic anastomosis, normal rectosigmoid colon, normal rectum.  3.  Colon cancer: -She underwent subtotal colectomy with ileorectal anastomosis.  Denies any bleeding per rectum or melena. -CEA on 08/28/2018 was 2.5.  She has occasional diarrhea which is controlled with Imodium.  4.  Follicular non-Hodgkin's lymphoma: -Sigmoid colon resection in 2007 with  2+ lymph nodes. - She denies any B symptoms.  5.  B12 deficiency: -She continues to have B12 injections monthly.  6.  Health maintenance: -Bilateral mammograms on 08/03/2018 was BI-RADS Category 1.   Total time spent is 25 minutes with more than 50% of the time spent face-to-face discussing results, further management and coordination of care.  Orders placed this encounter:  Orders Placed This Encounter  Procedures  . CT ABDOMEN [place], [place] [phone]

## 2018-08-31 NOTE — Assessment & Plan Note (Addendum)
1.  Left renal cell carcinoma: -Status post left partial nephrectomy on 04/17/2005.  Pathology revealed stage T1 a clear cell carcinoma, Fuhrman nuclear grade 3/4, tumor size was 1.9 cm. - CT abdomen pelvis on 03/20/2018 showed new 1.9 cm left upper pole kidney mass enhancing slightly concerning for recurrence.  This was seen along the partial nephrectomy site superiorly.  -No significant change in the retroperitoneal, pericaval and periaortic adenopathy, largest measuring 1.6 cm. - PET CT scan on 04/06/2018 shows 17 mm lesion in the upper pole of the left kidney with no FDG accumulation above background renal parenchyma levels.  Mild lymphadenopathy in the chest, abdomen shows low-level hypermetabolism. - MRI of the abdomen on 04/21/2018 shows enhancing and possibly partially centrally necrotic 18 mm left upper pole renal lesion suspicious for recurrent renal cell cancer near the partial nephrectomy site.  Stable retrocrural and retroperitoneal lymphadenopathy. - She underwent left renal mass cryoablation and biopsy on 05/20/2018. -Had a prolonged discussion about the biopsy which showed clear cell renal cell carcinoma, grade 2. -I have recommended doing a CT scan of the abdomen with and without contrast to follow-up on the cryoablation.  We will see her back after the biopsy.  2.  Lynch syndrome: - Patient has MLH1 mutation. - Status post TAH and BSO in 1993. -Flexible sigmoidoscopy on 08/13/2016 shows normal ileum, end-to-end ileocolonic anastomosis, normal rectosigmoid colon, normal rectum.  3.  Colon cancer: -She underwent subtotal colectomy with ileorectal anastomosis.  Denies any bleeding per rectum or melena. -CEA on 08/28/2018 was 2.5.  She has occasional diarrhea which is controlled with Imodium.  4.  Follicular non-Hodgkin's lymphoma: -Sigmoid colon resection in 2007 with 2+ lymph nodes. - She denies any B symptoms.  5.  B12 deficiency: -She continues to have B12 injections monthly.  6.   Health maintenance: -Bilateral mammograms on 08/03/2018 was BI-RADS Category 1.

## 2018-09-08 ENCOUNTER — Ambulatory Visit (HOSPITAL_COMMUNITY)
Admission: RE | Admit: 2018-09-08 | Discharge: 2018-09-08 | Disposition: A | Discharge status: Home / Self Care | Payer: PPO | Source: Ambulatory Visit | Attending: Hematology | Admitting: Hematology

## 2018-09-08 ENCOUNTER — Other Ambulatory Visit: Payer: Self-pay

## 2018-09-08 DIAGNOSIS — C642 Malignant neoplasm of left kidney, except renal pelvis: Secondary | ICD-10-CM | POA: Insufficient documentation

## 2018-09-08 MED ORDER — IOHEXOL 300 MG/ML  SOLN
100.0000 mL | Freq: Once | INTRAMUSCULAR | Status: AC | PRN
  Administered 2018-09-08: 09:00:00 100 mL via INTRAVENOUS

## 2018-09-09 ENCOUNTER — Inpatient Hospital Stay (HOSPITAL_BASED_OUTPATIENT_CLINIC_OR_DEPARTMENT_OTHER): Payer: PPO | Admitting: Hematology

## 2018-09-09 ENCOUNTER — Encounter (HOSPITAL_COMMUNITY): Payer: Self-pay | Admitting: Hematology

## 2018-09-09 VITALS — BP 184/72 | HR 94 | Temp 98.6°F | Resp 16 | Wt 210.6 lb

## 2018-09-09 DIAGNOSIS — R197 Diarrhea, unspecified: Secondary | ICD-10-CM

## 2018-09-09 DIAGNOSIS — Z79899 Other long term (current) drug therapy: Secondary | ICD-10-CM | POA: Diagnosis not present

## 2018-09-09 DIAGNOSIS — C642 Malignant neoplasm of left kidney, except renal pelvis: Secondary | ICD-10-CM

## 2018-09-09 DIAGNOSIS — Z8614 Personal history of Methicillin resistant Staphylococcus aureus infection: Secondary | ICD-10-CM

## 2018-09-09 DIAGNOSIS — Z87442 Personal history of urinary calculi: Secondary | ICD-10-CM | POA: Diagnosis not present

## 2018-09-09 DIAGNOSIS — Z8572 Personal history of non-Hodgkin lymphomas: Secondary | ICD-10-CM

## 2018-09-09 DIAGNOSIS — Z85038 Personal history of other malignant neoplasm of large intestine: Secondary | ICD-10-CM | POA: Diagnosis not present

## 2018-09-09 DIAGNOSIS — M129 Arthropathy, unspecified: Secondary | ICD-10-CM | POA: Diagnosis not present

## 2018-09-09 DIAGNOSIS — R000 Tachycardia, unspecified: Secondary | ICD-10-CM | POA: Diagnosis not present

## 2018-09-09 DIAGNOSIS — M8580 Other specified disorders of bone density and structure, unspecified site: Secondary | ICD-10-CM

## 2018-09-09 DIAGNOSIS — Z1509 Genetic susceptibility to other malignant neoplasm: Secondary | ICD-10-CM | POA: Diagnosis not present

## 2018-09-09 DIAGNOSIS — E538 Deficiency of other specified B group vitamins: Secondary | ICD-10-CM

## 2018-09-09 NOTE — Progress Notes (Signed)
Nicole [name], [place] [address]   CLINIC:  Medical Oncology/Hematology  PCP:  Lemmie Evens, MD [place] [address] [phone]   REASON FOR VISIT: Follow-up for Lynch Syndrome, Hx of renal cancer, hx of colon cancer, hx of follicular lymphoma.  CURRENT THERAPY:Observation.   INTERVAL HISTORY:  Nicole [name] 68 y.o. female returns for follow-up of kidney cancer.  She had scans done after cryoablation and biopsy on 05/20/2018.  She denies any hematuria.  Denies any new onset pains.  Denies any fevers, night sweats or weight loss.  No ER visits or hospitalizations.  Denies any nausea, vomiting, diarrhea or constipation.   REVIEW OF SYSTEMS:  Review of Systems  Respiratory: Negative for shortness of breath.   Neurological: Negative for numbness.  All other systems reviewed and are negative.    PAST MEDICAL/SURGICAL HISTORY:  Past Medical History:  Diagnosis Date  . Arthritis    shorulder  . B12 deficiency   . Chronic diarrhea SECONDARY HX COLON CANCER  . History of colon cancer [facility] SYNDROME ---  COLORECTAL CANCER S/P COLECTOMY X3  LAST ONE 2007   NO RECURRENCE  . History of colon cancer 05/04/2013  . History of kidney infection 08/2013  . History of kidney stones   . History of MRSA infection 08/2013   left leg  . Hx of osteopenia   . Lynch syndrome 10/17/2010   Hereditary colon cancer  . NHL (non-Hodgkin's lymphoma) ([place]) 10/17/2010   MONITORED BY DR Tressie Stalker  . Personal history of renal cancer S/P LEFT PARTIAL NEPHRECTOMY 2007--  NO RECURRENCE   DUE TO KIDNEY CANCER  . Right shoulder pain S/P ROTATOR CUFF REPAIR   Past Surgical History:  Procedure Laterality Date  . APPENDECTOMY  1993   W/ LAVH  . COLONOSCOPY W/ POLYPECTOMY    . CYSTOSCOPY W/ RETROGRADES  07/14/2014   Procedure: CYSTOSCOPY WITH RETROGRADE PYELOGRAM;  Surgeon: Franchot Gallo, MD;  Location: [facility];  Service: Urology;;   . Consuela Mimes W/ URETERAL STENT REMOVAL Left 07/14/2014   Procedure: CYSTOSCOPY WITH STENT REMOVAL;  Surgeon: Franchot Gallo, MD;  Location: [facility];  Service: Urology;  Laterality: Left;  . CYSTOSCOPY WITH STENT PLACEMENT  02/07/2012   Procedure: CYSTOSCOPY WITH STENT PLACEMENT;  Surgeon: Franchot Gallo, MD;  Location: [facility];  Service: Urology;  Laterality: Left;  . CYSTOSCOPY WITH STENT PLACEMENT Left 07/14/2014   Procedure: CYSTOSCOPY WITH STENT PLACEMENT;  Surgeon: Franchot Gallo, MD;  Location: [facility];  Service: Urology;  Laterality: Left;  . FLEXIBLE SIGMOIDOSCOPY  08/29/2011   Procedure: FLEXIBLE SIGMOIDOSCOPY;  Surgeon: Rogene Houston, MD;  [facility];  Service: Endoscopy;  Laterality: N/A;  930  . FLEXIBLE SIGMOIDOSCOPY N/A 09/28/2012   Procedure: FLEXIBLE SIGMOIDOSCOPY;  Surgeon: Rogene Houston, MD;  [facility];  Service: Endoscopy;  Laterality: N/A;  730  . FLEXIBLE SIGMOIDOSCOPY N/A 07/08/2014   Procedure: FLEXIBLE SIGMOIDOSCOPY;  Surgeon: Rogene Houston, MD;  [facility];  Service: Endoscopy;  Laterality: N/A;  830 - moved to 10:20 - Ann to notify pt  . FLEXIBLE SIGMOIDOSCOPY N/A 08/11/2015   Procedure: FLEXIBLE SIGMOIDOSCOPY;  Surgeon: Rogene Houston, MD;  [facility];  Service: Endoscopy;  Laterality: N/A;  730  . FLEXIBLE SIGMOIDOSCOPY N/A 08/13/2016   Procedure: FLEXIBLE SIGMOIDOSCOPY;  Surgeon: Rogene Houston, MD;  [facility];  Service: Endoscopy;  Laterality: N/A;  730  . HEMICOLECTOMY  2001   right  . HEMICOLECTOMY  2002   left  . HOLMIUM LASER APPLICATION Left 8/76/8115   Procedure: HOLMIUM LASER LITHOTRIPSY,;  Surgeon: Franchot Gallo, MD;  Location: [facility];  Service: Urology;  Laterality: Left;  . IR RADIOLOGIST EVAL & MGMT  04/29/2018  . kidney resection  2007   left partial  . LAPAROSCOPIC ASSISTED VAGINAL  HYSTERECTOMY  1993   W/ BILATERAL SALPINGO-OOPHORECTOMY  . LEFT FLANK EXPLORATION W/  PARTIAL LEFT NEPHRECTOMY/ LEFT HILAR LYMPH NODE BX  04-17-2005  DR DAHLSTEDT   RENAL CELL CARCINOMA  . LITHOTRIPSY  11-22013   x2  . NEPHROLITHOTOMY Left 06/09/2014   Procedure: NEPHROLITHOTOMY PERCUTANEOUS;  Surgeon: Franchot Gallo, MD;  [facility];  Service: Urology;  Laterality: Left;  with STENT  . RADIOLOGY WITH ANESTHESIA Left 05/20/2018   Procedure: CT WITH ANESTHESIA LEFT RENAL CRYOABLATION AND BIOPSY;  Surgeon: Greggory Keen, MD;  Location: [facility];  Service: Anesthesiology;  Laterality: Left;  . RIGHT ROTATOR  CUFF REPAIR/   BICEP REPAIR  10-17-2011  DR SUPPLE  . SUBTOTAL COLECTOMY  2007  . URETEROSCOPY WITH HOLMIUM LASER LITHOTRIPSY Left 07/14/2014   Procedure: URETEROSCOPY  EXTRACTION OF STONES;  Surgeon: Franchot Gallo, MD;  Location: [facility];  Service: Urology;  Laterality: Left;     SOCIAL HISTORY:  Social History   Socioeconomic History  . Marital status: Divorced    Spouse name: Not on file  . Number of children: Not on file  . Years of education: Not on file  . Highest education level: Not on file  Occupational History  . Not on file  Social Needs  . Financial resource strain: Not on file  . Food insecurity    Worry: Not on file    Inability: Not on file  . Transportation needs    Medical: Not on file    Non-medical: Not on file  Tobacco Use  . Smoking status: Never Smoker  . Smokeless tobacco: Never Used  Substance and Sexual Activity  . Alcohol use: No  . Drug use: No  . Sexual activity: Not on file  Lifestyle  . Physical activity    Days per week: Not on file    Minutes per session: Not on file  . Stress: Not on file  Relationships  . Social Herbalist on phone: Not on file    Gets together: Not on file    Attends religious service: Not on file    Active member of club or organization: Not on file    Attends meetings of  clubs or organizations: Not on file    Relationship status: Not on file  . Intimate partner violence    Fear of current or ex partner: Not on file    Emotionally abused: Not on file    Physically abused: Not on file    Forced sexual activity: Not on file  Other Topics Concern  . Not on file  Social History Narrative  . Not on file    FAMILY HISTORY:  Family History  Problem Relation Age of Onset  . Cancer Son     CURRENT MEDICATIONS:  Outpatient Encounter Medications as of 09/09/2018  Medication Sig  . Biotin 10000 MCG TABS Take 10,000 mcg by mouth daily.  . Cholecalciferol (VITAMIN D3) 125 MCG (5000 UT) CAPS Take 5,000 Units by mouth daily.  . cyanocobalamin (,VITAMIN B-12,) 1000 MCG/ML injection  Inject 1 mL (1,000 mcg total) into the muscle every 21 ( twenty-one) days.  . diphenoxylate-atropine (LOMOTIL) 2.5-0.025 MG tablet TAKE 2 TABLETS BY MOUTH THREE TIMES DAILY BEFORE MEALS (Patient taking differently: Take 2 tablets by mouth 3 (three) times daily as needed for diarrhea or loose stools. )  . ECHINACEA PO Take 1 tablet by mouth every other day.  . furosemide (LASIX) 40 MG tablet Take 40 mg by mouth daily as needed for fluid.  Marland Kitchen ibuprofen (ADVIL,MOTRIN) 800 MG tablet Take 800 mg by mouth 3 (three) times daily as needed for moderate pain.   Marland Kitchen Potassium Citrate 15 MEQ (1620 MG) TBCR Take 15 mEq by mouth 3 (three) times daily.   No facility-administered encounter medications on file as of 09/09/2018.     ALLERGIES:  Allergies  Allergen Reactions  . Latex Rash  . Morphine And Related Other (See Comments)    hallucinations  . Tape Rash    Certain tapes      PHYSICAL EXAM:  ECOG Performance status: 1  Vitals:   09/09/18 1500  BP: (!) 184/72  Pulse: 94  Resp: 16  Temp: 98.6 F (37 C)  SpO2: 97%   Filed Weights   09/09/18 1500  Weight: 210 lb 9 oz (95.5 kg)    Physical Exam Constitutional:      Appearance: Normal appearance. She is normal weight.   Cardiovascular:     Rate and Rhythm: Normal rate and regular rhythm.     Heart sounds: Normal heart sounds.  Pulmonary:     Effort: Pulmonary effort is normal.     Breath sounds: Normal breath sounds.  Abdominal:     General: There is no distension.     Palpations: Abdomen is soft. There is no mass.  Musculoskeletal: Normal range of motion.  Skin:    General: Skin is warm.  Neurological:     Mental Status: She is alert and oriented to person, place, and time. Mental status is at baseline.  Psychiatric:        Mood and Affect: Mood normal.        Behavior: Behavior normal.        Thought Content: Thought content normal.        Judgment: Judgment normal.      LABORATORY DATA:  I have reviewed the labs as listed.  CBC    Component Value Date/Time   WBC 8.2 08/28/2018 1158   RBC 4.41 08/28/2018 1158   HGB 13.3 08/28/2018 1158   HCT 40.9 08/28/2018 1158   PLT 166 08/28/2018 1158   MCV 92.7 08/28/2018 1158   MCH 30.2 08/28/2018 1158   MCHC 32.5 08/28/2018 1158   RDW 12.8 08/28/2018 1158   LYMPHSABS 1.7 08/28/2018 1158   MONOABS 0.9 08/28/2018 1158   EOSABS 0.2 08/28/2018 1158   BASOSABS 0.1 08/28/2018 1158   CMP Latest Ref Rng & Units 08/28/2018 05/21/2018 05/20/2018  Glucose 70 - 99 mg/dL 145(H) 130(H) 103(H)  BUN 8 - 23 mg/dL _0 Creatinine 0.44 - 1.00 mg/dL 0.80 0.74 0.77  Sodium 135 - 145 mmol/L 137 138 139  Potassium 3.5 - 5.1 mmol/L 4.1 4.2 4.6  Chloride 98 - 111 mmol/L 104 107 107  CO2 22 - 32 mmol/L _1 Calcium 8.9 - 10.3 mg/dL 8.9 8.4(L) 9.1  Total Protein 6.5 - 8.1 g/dL 7.0 - -  Total Bilirubin 0.3 - 1.2 mg/dL 0.7 - -  Alkaline Phos 38 - 126 U/L 69 - -  AST 15 - 41 U/L 19 - -  ALT 0 - 44 U/L 19 - -       DIAGNOSTIC IMAGING:  I have independently reviewed the scans and discussed with the patient.      ASSESSMENT & PLAN:   Renal cell carcinoma 1.  Left renal cell carcinoma: -Status post left partial nephrectomy on 04/17/2005.  Pathology  revealed stage T1 a clear cell carcinoma, Fuhrman nuclear grade 3/4, tumor size was 1.9 cm. - CT abdomen pelvis on 03/20/2018 showed new 1.9 cm left upper pole kidney mass enhancing slightly concerning for recurrence.  This was seen along the partial nephrectomy site superiorly.  -No significant change in the retroperitoneal, pericaval and periaortic adenopathy, largest measuring 1.6 cm. - PET CT scan on 04/06/2018 shows 17 mm lesion in the upper pole of the left kidney with no FDG accumulation above background renal parenchyma levels.  Mild lymphadenopathy in the chest, abdomen shows low-level hypermetabolism. - MRI of the abdomen on 04/21/2018 shows enhancing and possibly partially centrally necrotic 18 mm left upper pole renal lesion suspicious for recurrent renal cell cancer near the partial nephrectomy site.  Stable retrocrural and retroperitoneal lymphadenopathy. - She underwent left renal mass cryoablation and biopsy on 05/20/2018. -Had a prolonged discussion about the biopsy which showed clear cell renal cell carcinoma, grade 2. -We have reviewed CT abdomen from 09/08/2018.  It shows cryoablation of the left upper pole renal cell carcinoma with no viable tumor.  Stable retroperitoneal adenopathy. -I have recommended follow-up in 6 months with repeat CT scan.  2.  Lynch syndrome: - Patient has MLH1 mutation. - Status post TAH and BSO in 1993. -Flexible sigmoidoscopy on 08/13/2016 shows normal ileum, end-to-end ileocolonic anastomosis, normal rectosigmoid colon, normal rectum.  3.  Colon cancer: -She underwent subtotal colectomy with ileorectal anastomosis.  Denies any bleeding per rectum or melena. -CEA on 08/28/2018 was 2.5.  She has occasional diarrhea which is controlled with Imodium.  4.  Follicular non-Hodgkin's lymphoma: -Sigmoid colon resection in 2007 with 2+ lymph nodes. - She denies any B symptoms.  5.  B12 deficiency: -She continues to have B12 injections monthly.  6.  Health  maintenance: -Bilateral mammograms on 08/03/2018 was BI-RADS Category 1.    Orders placed this encounter:  Orders Placed This Encounter  Procedures  . CT ABDOMEN [place], [place] [phone]

## 2018-09-09 NOTE — Assessment & Plan Note (Signed)
1.  Left renal cell carcinoma: -Status post left partial nephrectomy on 04/17/2005.  Pathology revealed stage T1 a clear cell carcinoma, Fuhrman nuclear grade 3/4, tumor size was 1.9 cm. - CT abdomen pelvis on 03/20/2018 showed new 1.9 cm left upper pole kidney mass enhancing slightly concerning for recurrence.  This was seen along the partial nephrectomy site superiorly.  -No significant change in the retroperitoneal, pericaval and periaortic adenopathy, largest measuring 1.6 cm. - PET CT scan on 04/06/2018 shows 17 mm lesion in the upper pole of the left kidney with no FDG accumulation above background renal parenchyma levels.  Mild lymphadenopathy in the chest, abdomen shows low-level hypermetabolism. - MRI of the abdomen on 04/21/2018 shows enhancing and possibly partially centrally necrotic 18 mm left upper pole renal lesion suspicious for recurrent renal cell cancer near the partial nephrectomy site.  Stable retrocrural and retroperitoneal lymphadenopathy. - She underwent left renal mass cryoablation and biopsy on 05/20/2018. -Had a prolonged discussion about the biopsy which showed clear cell renal cell carcinoma, grade 2. -We have reviewed CT abdomen from 09/08/2018.  It shows cryoablation of the left upper pole renal cell carcinoma with no viable tumor.  Stable retroperitoneal adenopathy. -I have recommended follow-up in 6 months with repeat CT scan.  2.  Lynch syndrome: - Patient has MLH1 mutation. - Status post TAH and BSO in 1993. -Flexible sigmoidoscopy on 08/13/2016 shows normal ileum, end-to-end ileocolonic anastomosis, normal rectosigmoid colon, normal rectum.  3.  Colon cancer: -She underwent subtotal colectomy with ileorectal anastomosis.  Denies any bleeding per rectum or melena. -CEA on 08/28/2018 was 2.5.  She has occasional diarrhea which is controlled with Imodium.  4.  Follicular non-Hodgkin's lymphoma: -Sigmoid colon resection in 2007 with 2+ lymph nodes. - She denies any B  symptoms.  5.  B12 deficiency: -She continues to have B12 injections monthly.  6.  Health maintenance: -Bilateral mammograms on 08/03/2018 was BI-RADS Category 1.

## 2019-02-08 ENCOUNTER — Other Ambulatory Visit: Payer: Self-pay

## 2019-02-08 ENCOUNTER — Ambulatory Visit
Admission: EM | Admit: 2019-02-08 | Discharge: 2019-02-08 | Disposition: A | Discharge status: Home / Self Care | Payer: PPO | Attending: Emergency Medicine | Admitting: Emergency Medicine

## 2019-02-08 DIAGNOSIS — M5442 Lumbago with sciatica, left side: Secondary | ICD-10-CM

## 2019-02-08 DIAGNOSIS — M25552 Pain in left hip: Secondary | ICD-10-CM | POA: Diagnosis not present

## 2019-02-08 MED ORDER — TIZANIDINE HCL 4 MG PO CAPS: 4.0000 mg | ORAL_CAPSULE | Freq: Every evening | ORAL | 0 refills | Status: DC | PRN

## 2019-02-08 MED ORDER — DEXAMETHASONE SODIUM PHOSPHATE 10 MG/ML IJ SOLN
10.0000 mg | Freq: Once | INTRAMUSCULAR | Status: AC
  Administered 2019-02-08: 10 mg via INTRAMUSCULAR

## 2019-02-08 NOTE — Discharge Instructions (Signed)
Decadron shot given in office Continue conservative management of rest, ice, heat, and gentle stretches/ massage Take tizanidine at nighttime for symptomatic relief. Avoid driving or operating heavy machinery while using medication. Follow up with PCP if symptoms persist Return or go to the ER if you have any new or worsening symptoms (fever, chills, chest pain, abdominal pain, changes in bowel or bladder habits, pain radiating into lower legs, etc...)

## 2019-02-08 NOTE — ED Triage Notes (Signed)
Pt presents with c/o left hip pain. Pt denies injury just began hurting on Saturday , becomes worse with walking

## 2019-02-08 NOTE — ED Provider Notes (Signed)
[facility]   [id number] 02/08/19 Arrival Time: [id number]  CC: Low back and LT hip pain  SUBJECTIVE: History from: patient. Nicole [name] is a 68 y.o. female complains of low back and LT hip pain x 3 days.  Denies a precipitating event or specific injury.  Localizes the pain to the the low back and outside of left hip, intermittently in the buttock.  Describes the pain as intermittent sharp, and dull in character.  Has tried OTC medications without relief.  Symptoms are made worse with walking.  Reports similar symptoms 2 weeks ago.  Denies fever, chills, erythema, ecchymosis, effusion, weakness, numbness and tingling, saddle paresthesias, loss of bowel or bladder function.      ROS: As per HPI.  All other pertinent ROS negative.     Past Medical History:  Diagnosis Date  . Arthritis    shorulder  . B12 deficiency   . Chronic diarrhea SECONDARY HX COLON CANCER  . History of colon cancer [facility] SYNDROME ---  COLORECTAL CANCER S/P COLECTOMY X3  LAST ONE 2007   NO RECURRENCE  . History of colon cancer 05/04/2013  . History of kidney infection 08/2013  . History of kidney stones   . History of MRSA infection 08/2013   left leg  . Hx of osteopenia   . Lynch syndrome 10/17/2010   Hereditary colon cancer  . NHL (non-Hodgkin's lymphoma) ([place]) 10/17/2010   MONITORED BY DR Tressie Stalker  . Personal history of renal cancer S/P LEFT PARTIAL NEPHRECTOMY 2007--  NO RECURRENCE   DUE TO KIDNEY CANCER  . Right shoulder pain S/P ROTATOR CUFF REPAIR   Past Surgical History:  Procedure Laterality Date  . APPENDECTOMY  1993   W/ LAVH  . COLONOSCOPY W/ POLYPECTOMY    . CYSTOSCOPY W/ RETROGRADES  07/14/2014   Procedure: CYSTOSCOPY WITH RETROGRADE PYELOGRAM;  Surgeon: Franchot Gallo, MD;  Location: [facility];  Service: Urology;;  . Consuela Mimes W/ URETERAL STENT REMOVAL Left 07/14/2014   Procedure: CYSTOSCOPY WITH STENT REMOVAL;  Surgeon: Franchot Gallo, MD;  Location: [facility];  Service: Urology;  Laterality: Left;  . CYSTOSCOPY WITH STENT PLACEMENT  02/07/2012   Procedure: CYSTOSCOPY WITH STENT PLACEMENT;  Surgeon: Franchot Gallo, MD;  Location[facility];  Service: Urology;  Laterality: Left;  . CYSTOSCOPY WITH STENT PLACEMENT Left 07/14/2014   Procedure: CYSTOSCOPY WITH STENT PLACEMENT;  Surgeon: Franchot Gallo, MD;  Location: [facility];  Service: Urology;  Laterality: Left;  . FLEXIBLE SIGMOIDOSCOPY  08/29/2011   Procedure: FLEXIBLE SIGMOIDOSCOPY;  Surgeon: Rogene Houston, MD;  [facility];  Service: Endoscopy;  Laterality: N/A;  930  . FLEXIBLE SIGMOIDOSCOPY N/A 09/28/2012   Procedure: FLEXIBLE SIGMOIDOSCOPY;  Surgeon: Rogene Houston, MD;  [facility];  Service: Endoscopy;  Laterality: N/A;  730  . FLEXIBLE SIGMOIDOSCOPY N/A 07/08/2014   Procedure: FLEXIBLE SIGMOIDOSCOPY;  Surgeon: Rogene Houston, MD;  [facility];  Service: Endoscopy;  Laterality: N/A;  830 - moved to 10:20 - Ann to notify pt  . FLEXIBLE SIGMOIDOSCOPY N/A 08/11/2015   Procedure: FLEXIBLE SIGMOIDOSCOPY;  Surgeon: Rogene Houston, MD;  [facility];  Service: Endoscopy;  Laterality: N/A;  730  . FLEXIBLE SIGMOIDOSCOPY N/A 08/13/2016   Procedure: FLEXIBLE SIGMOIDOSCOPY;  Surgeon: Rogene Houston, MD;  [facility];  Service: Endoscopy;  Laterality: N/A;  730  . HEMICOLECTOMY  2001   right  . HEMICOLECTOMY  2002   left  . HOLMIUM LASER APPLICATION Left 0000000   Procedure: HOLMIUM LASER LITHOTRIPSY,;  Surgeon: Franchot Gallo, MD;  Location: [facility];  Service: Urology;  Laterality: Left;  . IR RADIOLOGIST EVAL & MGMT  04/29/2018  . kidney resection  2007   left partial  . LAPAROSCOPIC ASSISTED VAGINAL HYSTERECTOMY  1993   W/ BILATERAL SALPINGO-OOPHORECTOMY  . LEFT FLANK EXPLORATION W/  PARTIAL LEFT NEPHRECTOMY/ LEFT HILAR LYMPH NODE BX  04-17-2005  DR DAHLSTEDT    RENAL CELL CARCINOMA  . LITHOTRIPSY  11-22013   x2  . NEPHROLITHOTOMY Left 06/09/2014   Procedure: NEPHROLITHOTOMY PERCUTANEOUS;  Surgeon: Franchot Gallo, MD;  [facility];  Service: Urology;  Laterality: Left;  with STENT  . RADIOLOGY WITH ANESTHESIA Left 05/20/2018   Procedure: CT WITH ANESTHESIA LEFT RENAL CRYOABLATION AND BIOPSY;  Surgeon: Greggory Keen, MD;  Location[facility];  Service: Anesthesiology;  Laterality: Left;  . RIGHT ROTATOR  CUFF REPAIR/   BICEP REPAIR  10-17-2011  DR SUPPLE  . SUBTOTAL COLECTOMY  2007  . URETEROSCOPY WITH HOLMIUM LASER LITHOTRIPSY Left 07/14/2014   Procedure: URETEROSCOPY  EXTRACTION OF STONES;  Surgeon: Franchot Gallo, MD;  Location: [facility];  Service: Urology;  Laterality: Left;   Allergies  Allergen Reactions  . Latex Rash  . Morphine And Related Other (See Comments)    hallucinations  . Tape Rash    Certain tapes    No current facility-administered medications on file prior to encounter.    Current Outpatient Medications on File Prior to Encounter  Medication Sig Dispense Refill  . Biotin 10000 MCG TABS Take 10,000 mcg by mouth daily.    . Cholecalciferol (VITAMIN D3) 125 MCG (5000 UT) CAPS Take 5,000 Units by mouth daily.    . cyanocobalamin (,VITAMIN B-12,) 1000 MCG/ML injection Inject 1 mL (1,000 mcg total) into the muscle every 21 ( twenty-one) days. 1 mL 17  . diphenoxylate-atropine (LOMOTIL) 2.5-0.025 MG tablet TAKE 2 TABLETS BY MOUTH THREE TIMES DAILY BEFORE MEALS (Patient taking differently: Take 2 tablets by mouth 3 (three) times daily as needed for diarrhea or loose stools. ) 180 tablet 2  . ECHINACEA PO Take 1 tablet by mouth every other day.    . furosemide (LASIX) 40 MG tablet Take 40 mg by mouth daily as needed for fluid.    Marland Kitchen ibuprofen (ADVIL,MOTRIN) 800 MG tablet Take 800 mg by mouth 3 (three) times daily as needed for moderate pain.   11  . Potassium Citrate 15 MEQ (1620 MG) TBCR Take 15 mEq by mouth 3  (three) times daily.  1   Social History   Socioeconomic History  . Marital status: Divorced    Spouse name: Not on file  . Number of children: Not on file  . Years of education: Not on file  . Highest education level: Not on file  Occupational History  . Not on file  Social Needs  . Financial resource strain: Not on file  . Food insecurity    Worry: Not on file    Inability: Not on file  . Transportation needs    Medical: Not on file    Non-medical: Not on file  Tobacco Use  . Smoking status: Never Smoker  . Smokeless tobacco: Never Used  Substance and Sexual Activity  . Alcohol use: No  . Drug use: No  . Sexual activity: Not on file  Lifestyle  . Physical activity    Days per week:  Not on file    Minutes per session: Not on file  . Stress: Not on file  Relationships  . Social Herbalist on phone: Not on file    Gets together: Not on file    Attends religious service: Not on file    Active member of club or organization: Not on file    Attends meetings of clubs or organizations: Not on file    Relationship status: Not on file  . Intimate partner violence    Fear of current or ex partner: Not on file    Emotionally abused: Not on file    Physically abused: Not on file    Forced sexual activity: Not on file  Other Topics Concern  . Not on file  Social History Narrative  . Not on file   Family History  Problem Relation Age of Onset  . Cancer Son     OBJECTIVE:  Vitals:   02/08/19 1459  BP: (!) 174/101  Pulse: 72  Resp: 20  Temp: 98 F (36.7 C)  SpO2: 97%    General appearance: ALERT; in no acute distress.  Head: NCAT Lungs: Normal respiratory effort; CTAB CV: RRR Musculoskeletal: Low back and LT hip Inspection: Skin warm, dry, clear and intact without obvious erythema, effusion, or ecchymosis.  Palpation: Diffusely TTP over sciatic notch, and lateral hip ROM: FROM active and passive Strength: 5/5 hip flexion, 5/5 hip extension, 5/5  knee flexion, 5/5 knee extension Skin: warm and dry Neurologic: Ambulates without difficulty; Sensation intact about the lower extremities Psychological: alert and cooperative; normal mood and affect  ASSESSMENT & PLAN:  1. Acute left-sided low back pain with left-sided sciatica   2. Discomfort of left hip     Meds ordered this encounter  Medications  . dexamethasone (DECADRON) injection 10 mg  . tiZANidine (ZANAFLEX) 4 MG capsule    Sig: Take 1 capsule (4 mg total) by mouth at bedtime as needed for muscle spasms.    Dispense:  12 capsule    Refill:  0    Order Specific Question:   Supervising Provider    Answer:   Raylene Everts [id number]   Decadron shot given in office Continue conservative management of rest, ice, heat, and gentle stretches/ massage Take tizanidine at nighttime for symptomatic relief. Avoid driving or operating heavy machinery while using medication. Follow up with PCP if symptoms persist Return or go to the ER if you have any new or worsening symptoms (fever, chills, chest pain, abdominal pain, changes in bowel or bladder habits, pain radiating into lower legs, etc...)   Reviewed expectations re: course of current medical issues. Questions answered. Outlined signs and symptoms indicating need for more acute intervention. Patient verbalized understanding. After Visit Summary given.    Lestine Box, PA-C 02/08/19 1538

## 2019-03-16 ENCOUNTER — Other Ambulatory Visit (HOSPITAL_COMMUNITY): Payer: Self-pay | Admitting: *Deleted

## 2019-03-16 DIAGNOSIS — E538 Deficiency of other specified B group vitamins: Secondary | ICD-10-CM

## 2019-03-16 DIAGNOSIS — C642 Malignant neoplasm of left kidney, except renal pelvis: Secondary | ICD-10-CM

## 2019-03-16 DIAGNOSIS — D649 Anemia, unspecified: Secondary | ICD-10-CM

## 2019-03-17 ENCOUNTER — Other Ambulatory Visit: Payer: Self-pay

## 2019-03-17 ENCOUNTER — Inpatient Hospital Stay (HOSPITAL_COMMUNITY): Payer: PPO | Attending: Hematology

## 2019-03-17 DIAGNOSIS — C642 Malignant neoplasm of left kidney, except renal pelvis: Secondary | ICD-10-CM | POA: Diagnosis not present

## 2019-03-17 DIAGNOSIS — E538 Deficiency of other specified B group vitamins: Secondary | ICD-10-CM

## 2019-03-17 DIAGNOSIS — D649 Anemia, unspecified: Secondary | ICD-10-CM

## 2019-03-17 LAB — CBC WITH DIFFERENTIAL/PLATELET
Abs Immature Granulocytes: 0.03 10*3/uL (ref 0.00–0.07)
Basophils Absolute: 0.1 10*3/uL (ref 0.0–0.1)
Basophils Relative: 1 %
Eosinophils Absolute: 0.2 10*3/uL (ref 0.0–0.5)
Eosinophils Relative: 3 %
HCT: 40.6 % (ref 36.0–46.0)
Hemoglobin: 12.9 g/dL (ref 12.0–15.0)
Immature Granulocytes: 0 %
Lymphocytes Relative: 23 %
Lymphs Abs: 1.8 10*3/uL (ref 0.7–4.0)
MCH: 30.1 pg (ref 26.0–34.0)
MCHC: 31.8 g/dL (ref 30.0–36.0)
MCV: 94.6 fL (ref 80.0–100.0)
Monocytes Absolute: 0.8 10*3/uL (ref 0.1–1.0)
Monocytes Relative: 10 %
Neutro Abs: 5 10*3/uL (ref 1.7–7.7)
Neutrophils Relative %: 63 %
Platelets: 161 10*3/uL (ref 150–400)
RBC: 4.29 MIL/uL (ref 3.87–5.11)
RDW: 12.8 % (ref 11.5–15.5)
WBC: 7.9 10*3/uL (ref 4.0–10.5)
nRBC: 0 % (ref 0.0–0.2)

## 2019-03-17 LAB — COMPREHENSIVE METABOLIC PANEL
ALT: 22 U/L (ref 0–44)
AST: 19 U/L (ref 15–41)
Albumin: 3.8 g/dL (ref 3.5–5.0)
Alkaline Phosphatase: 60 U/L (ref 38–126)
Anion gap: 12 (ref 5–15)
BUN: 19 mg/dL (ref 8–23)
CO2: 23 mmol/L (ref 22–32)
Calcium: 9.3 mg/dL (ref 8.9–10.3)
Chloride: 103 mmol/L (ref 98–111)
Creatinine, Ser: 0.79 mg/dL (ref 0.44–1.00)
GFR calc Af Amer: >60 mL/min (ref >60)
GFR calc non Af Amer: >60 mL/min (ref >60)
Glucose, Bld: 109 mg/dL — ABNORMAL HIGH (ref 70–99)
Potassium: 4.5 mmol/L (ref 3.5–5.1)
Sodium: 138 mmol/L (ref 135–145)
Total Bilirubin: 0.6 mg/dL (ref 0.3–1.2)
Total Protein: 6.8 g/dL (ref 6.5–8.1)

## 2019-03-17 LAB — LACTATE DEHYDROGENASE: LDH: 125 U/L (ref 98–192)

## 2019-03-18 LAB — CEA: CEA: 2.8 ng/mL (ref 0.0–4.7)

## 2019-03-22 ENCOUNTER — Ambulatory Visit (HOSPITAL_COMMUNITY)
Admission: RE | Admit: 2019-03-22 | Discharge: 2019-03-22 | Disposition: A | Discharge status: Home / Self Care | Payer: PPO | Source: Ambulatory Visit | Attending: Hematology | Admitting: Hematology

## 2019-03-22 ENCOUNTER — Other Ambulatory Visit: Payer: Self-pay

## 2019-03-22 DIAGNOSIS — C642 Malignant neoplasm of left kidney, except renal pelvis: Secondary | ICD-10-CM | POA: Diagnosis not present

## 2019-03-22 DIAGNOSIS — N200 Calculus of kidney: Secondary | ICD-10-CM | POA: Diagnosis not present

## 2019-03-22 DIAGNOSIS — C649 Malignant neoplasm of unspecified kidney, except renal pelvis: Secondary | ICD-10-CM | POA: Diagnosis not present

## 2019-03-22 MED ORDER — IOHEXOL 300 MG/ML  SOLN
100.0000 mL | Freq: Once | INTRAMUSCULAR | Status: AC | PRN
  Administered 2019-03-22: 09:00:00 100 mL via INTRAVENOUS

## 2019-03-23 ENCOUNTER — Other Ambulatory Visit: Payer: Self-pay

## 2019-03-24 ENCOUNTER — Inpatient Hospital Stay (HOSPITAL_COMMUNITY): Payer: PPO | Attending: Hematology | Admitting: Hematology

## 2019-03-24 DIAGNOSIS — Z1509 Genetic susceptibility to other malignant neoplasm: Secondary | ICD-10-CM | POA: Insufficient documentation

## 2019-03-24 DIAGNOSIS — E538 Deficiency of other specified B group vitamins: Secondary | ICD-10-CM | POA: Diagnosis not present

## 2019-03-24 DIAGNOSIS — Z8572 Personal history of non-Hodgkin lymphomas: Secondary | ICD-10-CM | POA: Diagnosis not present

## 2019-03-24 DIAGNOSIS — M129 Arthropathy, unspecified: Secondary | ICD-10-CM | POA: Diagnosis not present

## 2019-03-24 DIAGNOSIS — C642 Malignant neoplasm of left kidney, except renal pelvis: Secondary | ICD-10-CM

## 2019-03-24 DIAGNOSIS — Z85528 Personal history of other malignant neoplasm of kidney: Secondary | ICD-10-CM | POA: Insufficient documentation

## 2019-03-24 DIAGNOSIS — Z85038 Personal history of other malignant neoplasm of large intestine: Secondary | ICD-10-CM | POA: Insufficient documentation

## 2019-03-24 NOTE — Progress Notes (Signed)
[place], [place] [address]   CLINIC:  Medical Oncology/Hematology  PCP:  Lemmie Evens, MD [place] [address] [phone]   REASON FOR VISIT: Follow-up for Lynch Syndrome, Hx of renal cancer, hx of colon cancer, hx of follicular lymphoma.  CURRENT THERAPY:Observation.   INTERVAL HISTORY:  [name] 69 y.o. female seen for follow-up for kidney cancer and history of colon cancer and lymphoma.  Denies any fevers, night sweats or weight loss.  Appetite is 100%.  Energy levels are 50%.  No ER visits or hospitalizations.  Denies any hematuria.   REVIEW OF SYSTEMS:  Review of Systems  Respiratory: Negative for shortness of breath.   Neurological: Negative for numbness.  All other systems reviewed and are negative.    PAST MEDICAL/SURGICAL HISTORY:  Past Medical History:  Diagnosis Date  . Arthritis    shorulder  . B12 deficiency   . Chronic diarrhea SECONDARY HX COLON CANCER  . History of colon cancer [facility] SYNDROME ---  COLORECTAL CANCER S/P COLECTOMY X3  LAST ONE 2007   NO RECURRENCE  . History of colon cancer 05/04/2013  . History of kidney infection 08/2013  . History of kidney stones   . History of MRSA infection 08/2013   left leg  . Hx of osteopenia   . Lynch syndrome 10/17/2010   Hereditary colon cancer  . NHL (non-Hodgkin's lymphoma) ([facility]) 10/17/2010   MONITORED BY DR Tressie Stalker  . Personal history of renal cancer S/P LEFT PARTIAL NEPHRECTOMY 2007--  NO RECURRENCE   DUE TO KIDNEY CANCER  . Right shoulder pain S/P ROTATOR CUFF REPAIR   Past Surgical History:  Procedure Laterality Date  . APPENDECTOMY  1993   W/ LAVH  . COLONOSCOPY W/ POLYPECTOMY    . CYSTOSCOPY W/ RETROGRADES  07/14/2014   Procedure: CYSTOSCOPY WITH RETROGRADE PYELOGRAM;  Surgeon: Franchot Gallo, MD;  Location: [facility];  Service: Urology;;  . Consuela Mimes W/ URETERAL STENT REMOVAL Left 07/14/2014   Procedure:  CYSTOSCOPY WITH STENT REMOVAL;  Surgeon: Franchot Gallo, MD;  Location: [facility];  Service: Urology;  Laterality: Left;  . CYSTOSCOPY WITH STENT PLACEMENT  02/07/2012   Procedure: CYSTOSCOPY WITH STENT PLACEMENT;  Surgeon: Franchot Gallo, MD;  Location: [facility];  Service: Urology;  Laterality: Left;  . CYSTOSCOPY WITH STENT PLACEMENT Left 07/14/2014   Procedure: CYSTOSCOPY WITH STENT PLACEMENT;  Surgeon: Franchot Gallo, MD;  Location: [facility];  Service: Urology;  Laterality: Left;  . FLEXIBLE SIGMOIDOSCOPY  08/29/2011   Procedure: FLEXIBLE SIGMOIDOSCOPY;  Surgeon: Rogene Houston, MD;  [facility];  Service: Endoscopy;  Laterality: N/A;  930  . FLEXIBLE SIGMOIDOSCOPY N/A 09/28/2012   Procedure: FLEXIBLE SIGMOIDOSCOPY;  Surgeon: Rogene Houston, MD;  [facility];  Service: Endoscopy;  Laterality: N/A;  730  . FLEXIBLE SIGMOIDOSCOPY N/A 07/08/2014   Procedure: FLEXIBLE SIGMOIDOSCOPY;  Surgeon: Rogene Houston, MD;  [facility];  Service: Endoscopy;  Laterality: N/A;  830 - moved to 10:20 - Ann to notify pt  . FLEXIBLE SIGMOIDOSCOPY N/A 08/11/2015   Procedure: FLEXIBLE SIGMOIDOSCOPY;  Surgeon: Rogene Houston, MD;  [facility];  Service: Endoscopy;  Laterality: N/A;  730  . FLEXIBLE SIGMOIDOSCOPY N/A 08/13/2016   Procedure: FLEXIBLE SIGMOIDOSCOPY;  Surgeon: Rogene Houston, MD;  [facility];  Service: Endoscopy;  Laterality: N/A;  730  . HEMICOLECTOMY  2001  right  . HEMICOLECTOMY  2002   left  . HOLMIUM LASER APPLICATION Left 3/41/9622   Procedure: HOLMIUM LASER LITHOTRIPSY,;  Surgeon: Franchot Gallo, MD;  Location: [facility];  Service: Urology;  Laterality: Left;  . IR RADIOLOGIST EVAL & MGMT  04/29/2018  . kidney resection  2007   left partial  . LAPAROSCOPIC ASSISTED VAGINAL HYSTERECTOMY  1993   W/ BILATERAL SALPINGO-OOPHORECTOMY  . LEFT FLANK  EXPLORATION W/  PARTIAL LEFT NEPHRECTOMY/ LEFT HILAR LYMPH NODE BX  04-17-2005  DR DAHLSTEDT   RENAL CELL CARCINOMA  . LITHOTRIPSY  11-22013   x2  . NEPHROLITHOTOMY Left 06/09/2014   Procedure: NEPHROLITHOTOMY PERCUTANEOUS;  Surgeon: Franchot Gallo, MD;  Location: [facility];  Service: Urology;  Laterality: Left;  with STENT  . RADIOLOGY WITH ANESTHESIA Left 05/20/2018   Procedure: CT WITH ANESTHESIA LEFT RENAL CRYOABLATION AND BIOPSY;  Surgeon: Greggory Keen, MD;  Location: [facility];  Service: Anesthesiology;  Laterality: Left;  . RIGHT ROTATOR  CUFF REPAIR/   BICEP REPAIR  10-17-2011  DR SUPPLE  . SUBTOTAL COLECTOMY  2007  . URETEROSCOPY WITH HOLMIUM LASER LITHOTRIPSY Left 07/14/2014   Procedure: URETEROSCOPY  EXTRACTION OF STONES;  Surgeon: Franchot Gallo, MD;  Location[facility];  Service: Urology;  Laterality: Left;     SOCIAL HISTORY:  Social History   Socioeconomic History  . Marital status: Divorced    Spouse name: Not on file  . Number of children: Not on file  . Years of education: Not on file  . Highest education level: Not on file  Occupational History  . Not on file  Tobacco Use  . Smoking status: Never Smoker  . Smokeless tobacco: Never Used  Substance and Sexual Activity  . Alcohol use: No  . Drug use: No  . Sexual activity: Not on file  Other Topics Concern  . Not on file  Social History Narrative  . Not on file   Social Determinants of Health   Financial Resource Strain:   . Difficulty of Paying Living Expenses: Not on file  Food Insecurity:   . Worried About Charity fundraiser in the Last Year: Not on file  . Ran Out of Food in the Last Year: Not on file  Transportation Needs:   . Lack of Transportation (Medical): Not on file  . Lack of Transportation (Non-Medical): Not on file  Physical Activity:   . Days of Exercise per Week: Not on file  . Minutes of Exercise per Session: Not on file  Stress:   . Feeling of Stress : Not on file    Social Connections:   . Frequency of Communication with Friends and Family: Not on file  . Frequency of Social Gatherings with Friends and Family: Not on file  . Attends Religious Services: Not on file  . Active Member of Clubs or Organizations: Not on file  . Attends Archivist Meetings: Not on file  . Marital Status: Not on file  Intimate Partner Violence:   . Fear of Current or Ex-Partner: Not on file  . Emotionally Abused: Not on file  . Physically Abused: Not on file  . Sexually Abused: Not on file    FAMILY HISTORY:  Family History  Problem Relation Age of Onset  . Cancer Son     CURRENT MEDICATIONS:  Outpatient Encounter Medications as of 03/24/2019  Medication Sig  . Biotin 10000 MCG TABS Take 10,000 mcg by mouth daily.  . Cholecalciferol (VITAMIN  D3) 125 MCG (5000 UT) CAPS Take 5,000 Units by mouth daily.  . cyanocobalamin (,VITAMIN B-12,) 1000 MCG/ML injection Inject 1 mL (1,000 mcg total) into the muscle every 21 ( twenty-one) days.  Marland Kitchen ECHINACEA PO Take 1 tablet by mouth every other day.  Marland Kitchen FLUAD QUADRIVALENT 0.5 ML injection   . Potassium Citrate 15 MEQ (1620 MG) TBCR Take 15 mEq by mouth 3 (three) times daily.  Marland Kitchen SHINGRIX injection   . diphenoxylate-atropine (LOMOTIL) 2.5-0.025 MG tablet TAKE 2 TABLETS BY MOUTH THREE TIMES DAILY BEFORE MEALS (Patient not taking: No sig reported)  . furosemide (LASIX) 40 MG tablet Take 40 mg by mouth daily as needed for fluid.  Marland Kitchen ibuprofen (ADVIL,MOTRIN) 800 MG tablet Take 800 mg by mouth 3 (three) times daily as needed for moderate pain.   . [DISCONTINUED] tiZANidine (ZANAFLEX) 4 MG capsule Take 1 capsule (4 mg total) by mouth at bedtime as needed for muscle spasms.   No facility-administered encounter medications on file as of 03/24/2019.    ALLERGIES:  Allergies  Allergen Reactions  . Latex Rash  . Morphine And Related Other (See Comments)    hallucinations  . Tape Rash    Certain tapes      PHYSICAL EXAM:   ECOG Performance status: 1  Vitals:   03/24/19 0808  BP: (!) 145/72  Pulse: 96  Resp: 18  Temp: (!) 97.1 F (36.2 C)  SpO2: 97%   Filed Weights   03/24/19 0808  Weight: 208 lb 8 oz (94.6 kg)    Physical Exam Constitutional:      Appearance: Normal appearance. She is normal weight.  Cardiovascular:     Rate and Rhythm: Normal rate and regular rhythm.     Heart sounds: Normal heart sounds.  Pulmonary:     Effort: Pulmonary effort is normal.     Breath sounds: Normal breath sounds.  Abdominal:     General: There is no distension.     Palpations: Abdomen is soft. There is no mass.  Musculoskeletal:        General: Normal range of motion.  Skin:    General: Skin is warm.  Neurological:     Mental Status: She is alert and oriented to person, place, and time. Mental status is at baseline.  Psychiatric:        Mood and Affect: Mood normal.        Behavior: Behavior normal.        Thought Content: Thought content normal.        Judgment: Judgment normal.      LABORATORY DATA:  I have reviewed the labs as listed.  CBC    Component Value Date/Time   WBC 7.9 03/17/2019 0919   RBC 4.29 03/17/2019 0919   HGB 12.9 03/17/2019 0919   HCT 40.6 03/17/2019 0919   PLT 161 03/17/2019 0919   MCV 94.6 03/17/2019 0919   MCH 30.1 03/17/2019 0919   MCHC 31.8 03/17/2019 0919   RDW 12.8 03/17/2019 0919   LYMPHSABS 1.8 03/17/2019 0919   MONOABS 0.8 03/17/2019 0919   EOSABS 0.2 03/17/2019 0919   BASOSABS 0.1 03/17/2019 0919   CMP Latest Ref Rng & Units 03/17/2019 08/28/2018 05/21/2018  Glucose 70 - 99 mg/dL 109(H) 145(H) 130(H)  BUN 8 - 23 mg/dL _0 Creatinine 0.44 - 1.00 mg/dL 0.79 0.80 0.74  Sodium 135 - 145 mmol/L 138 137 138  Potassium 3.5 - 5.1 mmol/L 4.5 4.1 4.2  Chloride 98 - 111  mmol/L 103 104 107  CO2 22 - 32 mmol/L _0 Calcium 8.9 - 10.3 mg/dL 9.3 8.9 8.4(L)  Total Protein 6.5 - 8.1 g/dL 6.8 7.0 -  Total Bilirubin 0.3 - 1.2 mg/dL 0.6 0.7 -  Alkaline Phos  38 - 126 U/L 60 69 -  AST 15 - 41 U/L 19 19 -  ALT 0 - 44 U/L 22 19 -       DIAGNOSTIC IMAGING:  I have independently reviewed the scans and discussed with the patient.      ASSESSMENT & PLAN:   Renal cell carcinoma 1.  Left renal cell carcinoma: -Status post left partial nephrectomy on 04/17/2005.  Pathology revealed stage T1 a clear cell carcinoma, Fuhrman nuclear grade 3/4, tumor size was 1.9 cm. - CT abdomen pelvis on 03/20/2018 showed new 1.9 cm left upper pole kidney mass enhancing slightly concerning for recurrence.  This was seen along the partial nephrectomy site superiorly.  -No significant change in the retroperitoneal, pericaval and periaortic adenopathy, largest measuring 1.6 cm. - PET CT scan on 04/06/2018 shows 17 mm lesion in the upper pole of the left kidney with no FDG accumulation above background renal parenchyma levels.  Mild lymphadenopathy in the chest, abdomen shows low-level hypermetabolism. - MRI of the abdomen on 04/21/2018 shows enhancing and possibly partially centrally necrotic 18 mm left upper pole renal lesion suspicious for recurrent renal cell cancer near the partial nephrectomy site.  Stable retrocrural and retroperitoneal lymphadenopathy. -Status post left renal mass biopsy and cryoablation on 05/20/2018. -Biopsy showed clear cell renal cell carcinoma. -We reviewed results of CT of the abdomen with and without contrast on 03/22/2019 which showed cryoablation defect along the upper pole of the left kidney.  No evidence of recurrent disease.  Retroperitoneal adenopathy is stable. -We will plan to repeat CT of the abdomen with and without contrast in 6 months.  2.  Lynch syndrome: - Patient has MLH1 mutation. - Status post TAH and BSO in 1993. -Flexible sigmoidoscopy on 08/13/2016 shows normal ileum, end-to-end ileocolonic anastomosis, normal rectosigmoid colon, normal rectum.  3.  Colon cancer: -Underwent subtotal colectomy with ileorectal anastomosis.   Denies any change in bowel habits or bleeding per rectum. -We reviewed labs from 03/17/2019 which showed CEA of 2.8.  LFTs are normal.  4.  Follicular lymphoma: -Denies any B symptoms.  Retroperitoneal adenopathy is stable.  LDH is normal.  5.  B12 deficiency: -She continues to have B12 injections monthly.  6.  Health maintenance: -Bilateral mammograms on 08/03/2018 was BI-RADS Category 1.    Orders placed this encounter:  Orders Placed This Encounter  Procedures  . CT Abdomen W Contrast  . CBC with Differential/Platelet  . Comprehensive metabolic panel  . Lactate dehydrogenase  . CEA  . Vitamin B12      Derek Jack, [place] [phone]

## 2019-03-24 NOTE — Patient Instructions (Addendum)
[facility] at [facility] Discharge Instructions  You were seen today by Dr. Katragadda. He went over your recent lab and scan results. He will see you back in 6 months for labs, scan and follow up.   Thank you for choosing [facility] at [facility] to provide your oncology and hematology care.  To afford each patient quality time with our provider, please arrive at least 15 minutes before your scheduled appointment time.   If you have a lab appointment with the Cancer Center please come in thru the  Main Entrance and check in at the main information desk  You need to re-schedule your appointment should you arrive 10 or more minutes late.  We strive to give you quality time with our providers, and arriving late affects you and other patients whose appointments are after yours.  Also, if you no show three or more times for appointments you may be dismissed from the clinic at the providers discretion.     Again, thank you for choosing [facility].  Our hope is that these requests will decrease the amount of time that you wait before being seen by our physicians.       _____________________________________________________________  Should you have questions after your visit to [facility], please contact our office at [phone] between the hours of 8:00 a.m. and 4:30 p.m.  Voicemails left after 4:00 p.m. will not be returned until the following business day.  For prescription refill requests, have your pharmacy contact our office and allow 72 hours.    Cancer Center Support Programs:   > Cancer Support Group  2nd Tuesday of the month 1pm-2pm, Journey Room    

## 2019-03-24 NOTE — Assessment & Plan Note (Signed)
1.  Left renal cell carcinoma: -Status post left partial nephrectomy on 04/17/2005.  Pathology revealed stage T1 a clear cell carcinoma, Fuhrman nuclear grade 3/4, tumor size was 1.9 cm. - CT abdomen pelvis on 03/20/2018 showed new 1.9 cm left upper pole kidney mass enhancing slightly concerning for recurrence.  This was seen along the partial nephrectomy site superiorly.  -No significant change in the retroperitoneal, pericaval and periaortic adenopathy, largest measuring 1.6 cm. - PET CT scan on 04/06/2018 shows 17 mm lesion in the upper pole of the left kidney with no FDG accumulation above background renal parenchyma levels.  Mild lymphadenopathy in the chest, abdomen shows low-level hypermetabolism. - MRI of the abdomen on 04/21/2018 shows enhancing and possibly partially centrally necrotic 18 mm left upper pole renal lesion suspicious for recurrent renal cell cancer near the partial nephrectomy site.  Stable retrocrural and retroperitoneal lymphadenopathy. -Status post left renal mass biopsy and cryoablation on 05/20/2018. -Biopsy showed clear cell renal cell carcinoma. -We reviewed results of CT of the abdomen with and without contrast on 03/22/2019 which showed cryoablation defect along the upper pole of the left kidney.  No evidence of recurrent disease.  Retroperitoneal adenopathy is stable. -We will plan to repeat CT of the abdomen with and without contrast in 6 months.  2.  Lynch syndrome: - Patient has MLH1 mutation. - Status post TAH and BSO in 1993. -Flexible sigmoidoscopy on 08/13/2016 shows normal ileum, end-to-end ileocolonic anastomosis, normal rectosigmoid colon, normal rectum.  3.  Colon cancer: -Underwent subtotal colectomy with ileorectal anastomosis.  Denies any change in bowel habits or bleeding per rectum. -We reviewed labs from 03/17/2019 which showed CEA of 2.8.  LFTs are normal.  4.  Follicular lymphoma: -Denies any B symptoms.  Retroperitoneal adenopathy is stable.  LDH is  normal.  5.  B12 deficiency: -She continues to have B12 injections monthly.  6.  Health maintenance: -Bilateral mammograms on 08/03/2018 was BI-RADS Category 1.

## 2019-03-29 ENCOUNTER — Encounter (HOSPITAL_COMMUNITY): Payer: Self-pay | Admitting: Hematology

## 2019-04-13 ENCOUNTER — Other Ambulatory Visit: Payer: Self-pay | Admitting: Interventional Radiology

## 2019-04-13 DIAGNOSIS — C642 Malignant neoplasm of left kidney, except renal pelvis: Secondary | ICD-10-CM

## 2019-05-25 DIAGNOSIS — M79672 Pain in left foot: Secondary | ICD-10-CM | POA: Insufficient documentation

## 2019-05-27 ENCOUNTER — Ambulatory Visit: Payer: PPO

## 2019-05-27 ENCOUNTER — Other Ambulatory Visit: Payer: Self-pay

## 2019-05-27 ENCOUNTER — Encounter: Payer: Self-pay | Admitting: Orthopaedic Surgery

## 2019-05-27 ENCOUNTER — Ambulatory Visit: Payer: PPO | Admitting: Orthopaedic Surgery

## 2019-05-27 DIAGNOSIS — M79672 Pain in left foot: Secondary | ICD-10-CM

## 2019-05-27 NOTE — Progress Notes (Signed)
Subjective:    Patient ID: Nicole [name], female    DOB: 05-15-1950, 69 y.o.   MRN: [id number]  HPI She began having pain in the left heel about two weeks ago. She has no trauma, no redness.  She has pain when first standing but it gets better as she walks.  She has no change in shoe wear.  She has no numbness.  Tylenol does not help. She takes ibuprofen 800 tid for back pain.   Review of Systems  Constitutional: Positive for activity change.  Musculoskeletal: Positive for arthralgias and gait problem.  All other systems reviewed and are negative.  For Review of Systems, all other systems reviewed and are negative.  The following is a summary of the past history medically, past history surgically, known current medicines, social history and family history.  This information is gathered electronically by the computer from prior information and documentation.  I review this each visit and have found including this information at this point in the chart is beneficial and informative.   Past Medical History:  Diagnosis Date  . Arthritis    shorulder  . B12 deficiency   . Chronic diarrhea SECONDARY HX COLON CANCER  . History of colon cancer [facility] SYNDROME ---  COLORECTAL CANCER S/P COLECTOMY X3  LAST ONE 2007   NO RECURRENCE  . History of colon cancer 05/04/2013  . History of kidney infection 08/2013  . History of kidney stones   . History of MRSA infection 08/2013   left leg  . Hx of osteopenia   . Lynch syndrome 10/17/2010   Hereditary colon cancer  . NHL (non-Hodgkin's lymphoma) ([place]) 10/17/2010   MONITORED BY DR Tressie Stalker  . Personal history of renal cancer S/P LEFT PARTIAL NEPHRECTOMY 2007--  NO RECURRENCE   DUE TO KIDNEY CANCER  . Right shoulder pain S/P ROTATOR CUFF REPAIR    Past Surgical History:  Procedure Laterality Date  . APPENDECTOMY  1993   W/ LAVH  . COLONOSCOPY W/ POLYPECTOMY    . CYSTOSCOPY W/ RETROGRADES  07/14/2014   Procedure: CYSTOSCOPY WITH RETROGRADE  PYELOGRAM;  Surgeon: Franchot Gallo, MD;  Location: [facility];  Service: Urology;;  . Consuela Mimes W/ URETERAL STENT REMOVAL Left 07/14/2014   Procedure: CYSTOSCOPY WITH STENT REMOVAL;  Surgeon: Franchot Gallo, MD;  Location: [facility];  Service: Urology;  Laterality: Left;  . CYSTOSCOPY WITH STENT PLACEMENT  02/07/2012   Procedure: CYSTOSCOPY WITH STENT PLACEMENT;  Surgeon: Franchot Gallo, MD;  Location: [facility];  Service: Urology;  Laterality: Left;  . CYSTOSCOPY WITH STENT PLACEMENT Left 07/14/2014   Procedure: CYSTOSCOPY WITH STENT PLACEMENT;  Surgeon: Franchot Gallo, MD;  Location: [facility];  Service: Urology;  Laterality: Left;  . FLEXIBLE SIGMOIDOSCOPY  08/29/2011   Procedure: FLEXIBLE SIGMOIDOSCOPY;  Surgeon: Rogene Houston, MD;  [facility];  Service: Endoscopy;  Laterality: N/A;  930  . FLEXIBLE SIGMOIDOSCOPY N/A 09/28/2012   Procedure: FLEXIBLE SIGMOIDOSCOPY;  Surgeon: Rogene Houston, MD;  [facility];  Service: Endoscopy;  Laterality: N/A;  730  . FLEXIBLE SIGMOIDOSCOPY N/A 07/08/2014   Procedure: FLEXIBLE SIGMOIDOSCOPY;  Surgeon: Rogene Houston, MD;  [facility];  Service: Endoscopy;  Laterality: N/A;  830 - moved to 10:20 - Ann to notify pt  . FLEXIBLE SIGMOIDOSCOPY N/A 08/11/2015   Procedure: FLEXIBLE SIGMOIDOSCOPY;  Surgeon: Rogene Houston, MD;  [facility];  Service: Endoscopy;  Laterality: N/A;  Ault 08/13/2016   Procedure: FLEXIBLE SIGMOIDOSCOPY;  Surgeon: Rogene Houston, MD;  [facility];  Service: Endoscopy;  Laterality: N/A;  730  . HEMICOLECTOMY  2001   right  . HEMICOLECTOMY  2002   left  . HOLMIUM LASER APPLICATION Left 0000000   Procedure: HOLMIUM LASER LITHOTRIPSY,;  Surgeon: Franchot Gallo, MD;  Location: [facility];  Service: Urology;  Laterality: Left;  . IR RADIOLOGIST EVAL &  MGMT  04/29/2018  . kidney resection  2007   left partial  . LAPAROSCOPIC ASSISTED VAGINAL HYSTERECTOMY  1993   W/ BILATERAL SALPINGO-OOPHORECTOMY  . LEFT FLANK EXPLORATION W/  PARTIAL LEFT NEPHRECTOMY/ LEFT HILAR LYMPH NODE BX  04-17-2005  DR DAHLSTEDT   RENAL CELL CARCINOMA  . LITHOTRIPSY  11-22013   x2  . NEPHROLITHOTOMY Left 06/09/2014   Procedure: NEPHROLITHOTOMY PERCUTANEOUS;  Surgeon: Franchot Gallo, MD;  [facility];  Service: Urology;  Laterality: Left;  with STENT  . RADIOLOGY WITH ANESTHESIA Left 05/20/2018   Procedure: CT WITH ANESTHESIA LEFT RENAL CRYOABLATION AND BIOPSY;  Surgeon: Greggory Keen, MD;  [facility];  Service: Anesthesiology;  Laterality: Left;  . RIGHT ROTATOR  CUFF REPAIR/   BICEP REPAIR  10-17-2011  DR SUPPLE  . SUBTOTAL COLECTOMY  2007  . URETEROSCOPY WITH HOLMIUM LASER LITHOTRIPSY Left 07/14/2014   Procedure: URETEROSCOPY  EXTRACTION OF STONES;  Surgeon: Franchot Gallo, MD;  Location: [facility];  Service: Urology;  Laterality: Left;    Current Outpatient Medications on File Prior to Visit  Medication Sig Dispense Refill  . Biotin 10000 MCG TABS Take 10,000 mcg by mouth daily.    . Cholecalciferol (VITAMIN D3) 125 MCG (5000 UT) CAPS Take 5,000 Units by mouth daily.    . cyanocobalamin (,VITAMIN B-12,) 1000 MCG/ML injection Inject 1 mL (1,000 mcg total) into the muscle every 21 ( twenty-one) days. 1 mL 17  . ECHINACEA PO Take 1 tablet by mouth every other day.    . furosemide (LASIX) 40 MG tablet Take 40 mg by mouth daily as needed for fluid.    Marland Kitchen ibuprofen (ADVIL,MOTRIN) 800 MG tablet Take 800 mg by mouth 3 (three) times daily as needed for moderate pain.   11  . Potassium Citrate 15 MEQ (1620 MG) TBCR Take 15 mEq by mouth 3 (three) times daily.  1  . diphenoxylate-atropine (LOMOTIL) 2.5-0.025 MG tablet TAKE 2 TABLETS BY MOUTH THREE TIMES DAILY BEFORE MEALS (Patient not taking: No sig reported) 180 tablet 2   No current  facility-administered medications on file prior to visit.    Social History   Socioeconomic History  . Marital status: Divorced    Spouse name: Not on file  . Number of children: Not on file  . Years of education: Not on file  . Highest education level: Not on file  Occupational History  . Not on file  Tobacco Use  . Smoking status: Never Smoker  . Smokeless tobacco: Never Used  Substance and Sexual Activity  . Alcohol use: No  . Drug use: No  . Sexual activity: Not on file  Other Topics Concern  . Not on file  Social History Narrative  . Not on file   Social Determinants of Health   Financial Resource Strain:   . Difficulty of Paying Living Expenses:   Food Insecurity:   . Worried About Charity fundraiser in the Last Year:   . [employer]  in the Last Year:   Transportation Needs:   . Film/video editor (Medical):   Marland Kitchen Lack of Transportation (Non-Medical):   Physical Activity:   . Days of Exercise per Week:   . Minutes of Exercise per Session:   Stress:   . Feeling of Stress :   Social Connections:   . Frequency of Communication with Friends and Family:   . Frequency of Social Gatherings with Friends and Family:   . Attends Religious Services:   . Active Member of Clubs or Organizations:   . Attends Archivist Meetings:   Marland Kitchen Marital Status:   Intimate Partner Violence:   . Fear of Current or Ex-Partner:   . Emotionally Abused:   Marland Kitchen Physically Abused:   . Sexually Abused:     Family History  Problem Relation Age of Onset  . Cancer Son     BP (!) 156/83   Pulse 86   Ht 5' 0.5" (1.537 m)   Wt 209 lb (94.8 kg)   BMI 40.15 kg/m   Body mass index is 40.15 kg/m.     Objective:   Physical Exam Vitals and nursing note reviewed.  Constitutional:      Appearance: She is well-developed.  HENT:     Head: Normocephalic and atraumatic.  Eyes:     Conjunctiva/sclera: Conjunctivae normal.     Pupils: Pupils are equal, round, and reactive  to light.  Cardiovascular:     Rate and Rhythm: Normal rate and regular rhythm.  Pulmonary:     Effort: Pulmonary effort is normal.  Abdominal:     Palpations: Abdomen is soft.  Musculoskeletal:     Cervical back: Normal range of motion and neck supple.       Feet:  Skin:    General: Skin is warm and dry.  Neurological:     Mental Status: She is alert and oriented to person, place, and time.     Cranial Nerves: No cranial nerve deficit.     Motor: No abnormal muscle tone.     Coordination: Coordination normal.     Deep Tendon Reflexes: Reflexes are normal and symmetric. Reflexes normal.  Psychiatric:        Behavior: Behavior normal.        Thought Content: Thought content normal.        Judgment: Judgment normal.       X-rays of the left heel were done, reported separately.    Assessment & Plan:   Encounter Diagnosis  Name Primary?  . Pain of left heel    Begin stretching exercises.  I have explained these.  Continue the ibuprofen.  Consider heel pad.  Return in three weeks.  Call if any problem.  Precautions discussed.   Electronically Signed Sanjuana Kava, MD 3/11/20218:28 AM

## 2019-06-17 ENCOUNTER — Ambulatory Visit: Payer: PPO | Admitting: Orthopaedic Surgery

## 2019-07-06 ENCOUNTER — Other Ambulatory Visit (HOSPITAL_COMMUNITY): Payer: Self-pay | Admitting: Hematology

## 2019-07-06 DIAGNOSIS — E538 Deficiency of other specified B group vitamins: Secondary | ICD-10-CM

## 2019-07-08 ENCOUNTER — Telehealth: Payer: Self-pay | Admitting: Urology

## 2019-07-08 DIAGNOSIS — N200 Calculus of kidney: Secondary | ICD-10-CM

## 2019-07-08 NOTE — Telephone Encounter (Signed)
Pt called and states she has an appt on 08/24/19 but her potassium citrate has ran out and it got denied at pharmacy for a refill. She uses [facility] on [address] [place]

## 2019-07-09 MED ORDER — POTASSIUM CITRATE ER 15 MEQ (1620 MG) PO TBCR: 1.0000 | EXTENDED_RELEASE_TABLET | Freq: Three times a day (TID) | ORAL | 3 refills | Status: DC

## 2019-07-09 NOTE — Telephone Encounter (Signed)
Refill sent in. Pt notified.

## 2019-07-23 ENCOUNTER — Other Ambulatory Visit (HOSPITAL_COMMUNITY): Payer: Self-pay | Admitting: Family Medicine

## 2019-07-23 DIAGNOSIS — Z1231 Encounter for screening mammogram for malignant neoplasm of breast: Secondary | ICD-10-CM

## 2019-08-06 ENCOUNTER — Other Ambulatory Visit (HOSPITAL_COMMUNITY): Payer: Self-pay | Admitting: Family Medicine

## 2019-08-06 ENCOUNTER — Ambulatory Visit (HOSPITAL_COMMUNITY)
Admission: RE | Admit: 2019-08-06 | Discharge: 2019-08-06 | Disposition: A | Discharge status: Home / Self Care | Payer: PPO | Source: Ambulatory Visit | Attending: Family Medicine | Admitting: Family Medicine

## 2019-08-06 ENCOUNTER — Other Ambulatory Visit: Payer: Self-pay

## 2019-08-06 DIAGNOSIS — N951 Menopausal and female climacteric states: Secondary | ICD-10-CM

## 2019-08-06 DIAGNOSIS — Z1231 Encounter for screening mammogram for malignant neoplasm of breast: Secondary | ICD-10-CM

## 2019-08-10 DIAGNOSIS — E559 Vitamin D deficiency, unspecified: Secondary | ICD-10-CM | POA: Diagnosis not present

## 2019-08-10 DIAGNOSIS — R739 Hyperglycemia, unspecified: Secondary | ICD-10-CM | POA: Diagnosis not present

## 2019-08-10 DIAGNOSIS — N390 Urinary tract infection, site not specified: Secondary | ICD-10-CM | POA: Diagnosis not present

## 2019-08-10 DIAGNOSIS — E782 Mixed hyperlipidemia: Secondary | ICD-10-CM | POA: Diagnosis not present

## 2019-08-10 DIAGNOSIS — E6609 Other obesity due to excess calories: Secondary | ICD-10-CM | POA: Diagnosis not present

## 2019-08-23 ENCOUNTER — Other Ambulatory Visit: Payer: Self-pay

## 2019-08-23 ENCOUNTER — Ambulatory Visit (HOSPITAL_COMMUNITY)
Admission: RE | Admit: 2019-08-23 | Discharge: 2019-08-23 | Disposition: A | Discharge status: Home / Self Care | Payer: PPO | Source: Ambulatory Visit | Attending: Family Medicine | Admitting: Family Medicine

## 2019-08-23 DIAGNOSIS — M85851 Other specified disorders of bone density and structure, right thigh: Secondary | ICD-10-CM | POA: Insufficient documentation

## 2019-08-23 DIAGNOSIS — N951 Menopausal and female climacteric states: Secondary | ICD-10-CM | POA: Diagnosis not present

## 2019-08-23 DIAGNOSIS — Z780 Asymptomatic menopausal state: Secondary | ICD-10-CM | POA: Diagnosis not present

## 2019-08-24 ENCOUNTER — Ambulatory Visit: Payer: PPO | Admitting: Urology

## 2019-08-24 ENCOUNTER — Other Ambulatory Visit (HOSPITAL_COMMUNITY)
Admission: AD | Admit: 2019-08-24 | Discharge: 2019-08-24 | Disposition: A | Discharge status: Home / Self Care | Payer: PPO | Source: Skilled Nursing Facility | Attending: Urology | Admitting: Urology

## 2019-08-24 ENCOUNTER — Encounter: Payer: Self-pay | Admitting: Urology

## 2019-08-24 VITALS — BP 184/98 | HR 91 | Temp 97.8°F | Ht 60.5 in | Wt 209.0 lb

## 2019-08-24 DIAGNOSIS — N390 Urinary tract infection, site not specified: Secondary | ICD-10-CM | POA: Diagnosis not present

## 2019-08-24 LAB — POCT URINALYSIS DIPSTICK
Glucose, UA: NEGATIVE
Ketones, UA: NEGATIVE
Nitrite, UA: NEGATIVE
Protein, UA: POSITIVE — AB
Spec Grav, UA: >=1.03 — AB (ref 1.010–1.025)
Urobilinogen, UA: NEGATIVE E.U./dL — AB
pH, UA: 6 (ref 5.0–8.0)

## 2019-08-24 NOTE — Progress Notes (Addendum)
See progress notes

## 2019-08-24 NOTE — Progress Notes (Signed)
H&P  Chief Complaint: Kidney stones + Kidney Cancer  History of Present Illness:  6.8.2021: Nicole [name] is a 68 y.o. year old female returning today for follow-up. She denies having had any blood per urine or urinary tract infections in interim. She has an upcoming CT to f/u left renal mass cryoablation.  (below copied from [facility] records):  Kidney stones:   Here for follow-up of left sided urolithiasis. She underwent left PCNL March, 2016 followed by left-sided ureteroscopy with laser and extraction of residual left renal calculi in April 16.  She is on potassium citrate 15 mEq tid.   Stone analysis reveals carbonate apatite.   03/2016--CT A/P revealed bilateral renal calculi, largest stone being a 7 mm in left interpolar region.  24-hour urine showed low magnesium and citrate level.   She is now on potassium citrate 15 mEq at least twice a day.   1.6.2020: On her recent CT she did have a branched lower pole renal calculus. She is having no flank pain or hematuria.   Kidney Cancer:  The problem is on the left side.   She is not having flank pain. She does have a good appetite. BOWEL HABITS: her bowels are moving normally. She is not having pain in new locations. She has not recently had unwanted weight loss.   She is status post left partial nephrectomy 1.31.2007 for renal cell carcinoma. Final pathology revealed stage T1a clear cell carcinoma, Fuhrman nuclear grade 3/4. Tumor size was 1.9 cm.   CT A/P 1/18 revealed no evidence of recurrence.   1.6.2020: Recent CT scan from 3 days ago revealed enhancing 19 mm left upper pole renal mass consistent with recurrent renal cell carcinoma. There is stable retroperitoneal/peri aortic adenopathy. She is having no blood in the urine and no flank pain. This was treated w/ cryoablation.  Past Medical History:  Diagnosis Date  . Arthritis    shorulder  . B12 deficiency   . Chronic diarrhea SECONDARY HX COLON CANCER  . History of colon  cancer [facility] SYNDROME ---  COLORECTAL CANCER S/P COLECTOMY X3  LAST ONE 2007   NO RECURRENCE  . History of colon cancer 05/04/2013  . History of kidney infection 08/2013  . History of kidney stones   . History of MRSA infection 08/2013   left leg  . Hx of osteopenia   . Lynch syndrome 10/17/2010   Hereditary colon cancer  . NHL (non-Hodgkin's lymphoma) ([place]) 10/17/2010   MONITORED BY DR Tressie Stalker  . Personal history of renal cancer S/P LEFT PARTIAL NEPHRECTOMY 2007--  NO RECURRENCE   DUE TO KIDNEY CANCER  . Right shoulder pain S/P ROTATOR CUFF REPAIR    Past Surgical History:  Procedure Laterality Date  . APPENDECTOMY  1993   W/ LAVH  . COLONOSCOPY W/ POLYPECTOMY    . CYSTOSCOPY W/ RETROGRADES  07/14/2014   Procedure: CYSTOSCOPY WITH RETROGRADE PYELOGRAM;  Surgeon: Franchot Gallo, MD;  Location: [facility];  Service: Urology;;  . Consuela Mimes W/ URETERAL STENT REMOVAL Left 07/14/2014   Procedure: CYSTOSCOPY WITH STENT REMOVAL;  Surgeon: Franchot Gallo, MD;  Location: [facility];  Service: Urology;  Laterality: Left;  . CYSTOSCOPY WITH STENT PLACEMENT  02/07/2012   Procedure: CYSTOSCOPY WITH STENT PLACEMENT;  Surgeon: Franchot Gallo, MD;  [facility];  Service: Urology;  Laterality: Left;  . CYSTOSCOPY WITH STENT PLACEMENT Left 07/14/2014   Procedure: CYSTOSCOPY WITH STENT PLACEMENT;  Surgeon: Franchot Gallo, MD;  Location: [facility];  Service: Urology;  Laterality: Left;  . FLEXIBLE SIGMOIDOSCOPY  08/29/2011   Procedure: FLEXIBLE SIGMOIDOSCOPY;  Surgeon: Rogene Houston, MD;  [facility];  Service: Endoscopy;  Laterality: N/A;  930  . FLEXIBLE SIGMOIDOSCOPY N/A 09/28/2012   Procedure: FLEXIBLE SIGMOIDOSCOPY;  Surgeon: Rogene Houston, MD;  [facility];  Service: Endoscopy;  Laterality: N/A;  730  . FLEXIBLE SIGMOIDOSCOPY N/A 07/08/2014   Procedure: FLEXIBLE SIGMOIDOSCOPY;  Surgeon: Rogene Houston, MD;  [facility];  Service: Endoscopy;  Laterality: N/A;  830 - moved to 10:20 - Ann to notify pt  . FLEXIBLE SIGMOIDOSCOPY N/A 08/11/2015   Procedure: FLEXIBLE SIGMOIDOSCOPY;  Surgeon: Rogene Houston, MD;  [facility];  Service: Endoscopy;  Laterality: N/A;  730  . FLEXIBLE SIGMOIDOSCOPY N/A 08/13/2016   Procedure: FLEXIBLE SIGMOIDOSCOPY;  Surgeon: Rogene Houston, MD;  [facility];  Service: Endoscopy;  Laterality: N/A;  730  . HEMICOLECTOMY  2001   right  . HEMICOLECTOMY  2002   left  . HOLMIUM LASER APPLICATION Left 4/31/5400   Procedure: HOLMIUM LASER LITHOTRIPSY,;  Surgeon: Franchot Gallo, MD;  Location: [facility];  Service: Urology;  Laterality: Left;  . IR RADIOLOGIST EVAL & MGMT  04/29/2018  . kidney resection  2007   left partial  . LAPAROSCOPIC ASSISTED VAGINAL HYSTERECTOMY  1993   W/ BILATERAL SALPINGO-OOPHORECTOMY  . LEFT FLANK EXPLORATION W/  PARTIAL LEFT NEPHRECTOMY/ LEFT HILAR LYMPH NODE BX  04-17-2005  DR Kamyia Thomason   RENAL CELL CARCINOMA  . LITHOTRIPSY  11-22013   x2  . NEPHROLITHOTOMY Left 06/09/2014   Procedure: NEPHROLITHOTOMY PERCUTANEOUS;  Surgeon: Franchot Gallo, MD;  [facility];  Service: Urology;  Laterality: Left;  with STENT  . RADIOLOGY WITH ANESTHESIA Left 05/20/2018   Procedure: CT WITH ANESTHESIA LEFT RENAL CRYOABLATION AND BIOPSY;  Surgeon: Greggory Keen, MD;  [facility];  Service: Anesthesiology;  Laterality: Left;  . RIGHT ROTATOR  CUFF REPAIR/   BICEP REPAIR  10-17-2011  DR SUPPLE  . SUBTOTAL COLECTOMY  2007  . URETEROSCOPY WITH HOLMIUM LASER LITHOTRIPSY Left 07/14/2014   Procedure: URETEROSCOPY  EXTRACTION OF STONES;  Surgeon: Franchot Gallo, MD;  Location: [facility];  Service: Urology;  Laterality: Left;    Home Medications:  (Not in a hospital admission)   Allergies:  Allergies  Allergen Reactions  . Latex Rash  . Morphine And Related Other (See  Comments)    hallucinations  . Tape Rash    Certain tapes     Family History  Problem Relation Age of Onset  . Cancer Son     Social History:  reports that she has never smoked. She has never used smokeless tobacco. She reports that she does not drink alcohol or use drugs.  ROS: Urological Symptom Review  Patient is experiencing the following symptoms: Hard to postpone urination Burning/pain with urination Get up at night to urinate Leakage of urine Review of Systems Gastrointestinal (upper)  : Negative Gastrointestinal (lower) : Negative for lower GI symptoms Constitutional : Negative for symptoms Skin: Negative for skin symptoms Eyes: Negative for eye symptoms Ear/Nose/Throat : Negative for Ear/Nose/Throat symptoms Hematologic/Lymphatic: Negative for Hematologic/Lymphatic symptoms Cardiovascular : Leg swelling Respiratory : Negative for respiratory symptoms Endocrine: Negative for endocrine symptoms Musculoskeletal: Negative for musculoskeletal symptoms Neurological: Negative for neurological symptoms Psychologic: Negative for psychiatric symptoms   Physical Exam:  Vital signs in last 24 hours: Temp:  [  97.8 F (36.6 C)] 97.8 F (36.6 C) (06/08 0941) Pulse Rate:  [91] 91 (06/08 0941) BP: (184)/(98) 184/98 (06/08 0941) Weight:  [209 lb (94.8 kg)] 209 lb (94.8 kg) (06/08 0941) General:  Alert and oriented, No acute distress HEENT: Normocephalic, atraumatic Neck: No JVD or lymphadenopathy Cardiovascular: Regular rate  Lungs: Normal inspiratory/expiratory excursion Extremities: No edema Neurologic: Grossly intact  Laboratory Data:  Results for orders placed or performed in visit on 08/24/19 (from the past 24 hour(s))  POCT urinalysis dipstick     Status: Abnormal   Collection Time: 08/24/19 10:05 AM  Result Value Ref Range   Color, UA lt yellow    Clarity, UA clear    Glucose, UA Negative Negative   Bilirubin, UA small    Ketones, UA neg     Spec Grav, UA >=1.030 (A) 1.010 - 1.025   Blood, UA large    pH, UA 6.0 5.0 - 8.0   Protein, UA Positive (A) Negative   Urobilinogen, UA negative (A) 0.2 or 1.0 E.U./dL   Nitrite, UA neg    Leukocytes, UA Large (3+) (A) Negative   Appearance clear    Odor     No results found for this or any previous visit (from the past 240 hour(s)). Creatinine: No results for input(s): CREATININE in the last 168 hours.  Radiologic Imaging: DG BONE DENSITY (DXA)  Result Date: 08/23/2019 EXAM: DUAL X-RAY ABSORPTIOMETRY (DXA) FOR BONE MINERAL DENSITY IMPRESSION: Your patient Shinita Mac completed a BMD test on 08/23/2019 using the [place] (software version: 14.10) manufactured by [employer]. The following summarizes the results of our evaluation. Technologist: [facility] PATIENT BIOGRAPHICAL: Name: Jacklyne, Baik Patient ID: [id number] Birth Date: 09-28-1950 Height: 60.5 in. Gender: Female Exam Date: 08/23/2019 Weight: 209.0 lbs. Indications: Bilateral Oophrectomy, Caucasian, Follow up Osteopenia, Height Loss, Low Calcium Intake, Post Menopausal Fractures: Treatments: Vitamin D DENSITOMETRY RESULTS: Site      Region     Measured Date Measured Age WHO Classification Young Adult T-score BMD         %Change vs. Previous Significant Change (*) AP Spine L1-L3 (L2) 08/23/2019 70.0 Normal -0.2 1.142 g/cm2 6.4% Yes AP Spine L1-L3 (L2) 06/07/2014 64.8 Normal -0.8 1.073 g/cm2 - - DualFemur Neck Right 08/23/2019 70.0 Osteopenia -1.5 0.829 g/cm2 -0.1% - DualFemur Neck Right 06/07/2014 64.8 Osteopenia -1.5 0.830 g/cm2 - - DualFemur Total Mean 08/23/2019 70.0 Normal 0.5 1.069 g/cm2 -0.7% - DualFemur Total Mean 06/07/2014 64.8 Normal 0.6 1.077 g/cm2 - - ASSESSMENT: The BMD measured at Femur Neck Right is 0.829 g/cm2 with a T-score of -1.5. This patient is considered OSTEOPENIC according to [facility]) criteria. The scan quality is good. L2 and L4 were excluded due to degenerative  changes. Compared with the prior study on 06/07/2014, the BMD of the total mean shows no statistically significant change. World Pharmacologist [facility]) criteria for post-menopausal, Caucasian Women: Normal:       T-score at or above -1 SD Osteopenia:   T-score between -1 and -2.5 SD Osteoporosis: T-score at or below -2.5 SD RECOMMENDATIONS: 1. All patients should optimize calcium and vitamin D intake. 2. Consider FDA-approved medical therapies in postmenopausal women and med aged 48 years and older, based on the following: a. A hip or vertebral (clinical or morphometric) fracture b. T-score < -2.5 at the femoral neck or spine after appropriate evaluation to exclude secondary causes c. Low bone mass (T-score between -1.0 and -2.5 at the femoral neck or  spine) and a 10-year probability of a hip fracture > 3% or a 10-year probability of a major osteoporosis-related fracture > 20% based on the [place]-adapted WHO algorithm d. Clinician judgment and/or patient preferences may indicate treatment for people with 10-year fracture probabilities above or below these levels FOLLOW-UP: People with diagnosed cases of osteoporosis or at high risk for fracture should have regular bone mineral density tests. For patients eligible for Medicare, routine testing is allowed once every 2 years. The testing frequency can be increased to one year for patients who have rapidly progressing disease, those who are receiving or discontinuing medical therapy to restore bone mass, or have additional risk factors. I have reviewed this report, and agree with the above findings. Mark A. Thornton Papas, M.D. [facility] Your patient YANET BALLIET completed a FRAX assessment on 08/23/2019 using the [place] (analysis version: 14.10) manufactured by [employer]. The following summarizes the results of our evaluation. PATIENT BIOGRAPHICAL: Name: Reita, Shindler Patient ID: [id number] Birth Date: 07-04-1950 Height:    60.5 in.  Gender:     Female    [age]       Weight:    209.0 lbs. Ethnicity:  White                            Exam Date: 08/23/2019 FRAX* RESULTS:  (version: 3.5) 10-year Probability of Fracture1 Major Osteoporotic Fracture2 Hip Fracture 8.8% 1.2% Population: [place] (Caucasian) Risk Factors: None Based on Femur (Right) Neck BMD 1 -The 10-year probability of fracture may be lower than reported if the patient has received treatment. 2 -Major Osteoporotic Fracture: Clinical Spine, Forearm, Hip or Shoulder *FRAX is a Materials engineer of the [employer] for Metabolic Bone Disease, a [place] (WHO) [facility]. ASSESSMENT: The probability of a major osteoporotic fracture is 8.8% within the next ten years. The probability of a hip fracture is 1.2% within the next ten years. I have reviewed this report and agree with the above findings. Mark A. Thornton Papas, M.D. [facility] Electronically Signed   By: Lavonia Dana M.D.   On: 08/23/2019 09:25   I have reviewed the patient's medical history in detail and updated the computerized patient record. e Impression/Assessment:  Most recent CT (January 2021) showed ~19 mm stone present in left kidney and multiple smaller stones present in the lower pole. This stone burden is seemingly stable since June 2020.  Not currently symptomatic.  Plan:  1. She is already scheduled for a CT this summer -- requested that she have these results forwarded to our office.  2. Continue on potassium citrate.  3. Reminded of general stone prevention/management strategies.  4. Return for OV in 1 yr  Dena Billet 08/24/2019, 10:23 AM  Lillette Boxer. Rachell Druckenmiller MD

## 2019-08-25 LAB — URINE CULTURE: Culture: <10000 — AB

## 2019-09-16 ENCOUNTER — Other Ambulatory Visit: Payer: Self-pay

## 2019-09-16 ENCOUNTER — Inpatient Hospital Stay (HOSPITAL_COMMUNITY): Payer: PPO | Attending: Hematology

## 2019-09-16 DIAGNOSIS — Z79899 Other long term (current) drug therapy: Secondary | ICD-10-CM | POA: Diagnosis not present

## 2019-09-16 DIAGNOSIS — N200 Calculus of kidney: Secondary | ICD-10-CM | POA: Insufficient documentation

## 2019-09-16 DIAGNOSIS — M8580 Other specified disorders of bone density and structure, unspecified site: Secondary | ICD-10-CM | POA: Diagnosis not present

## 2019-09-16 DIAGNOSIS — C642 Malignant neoplasm of left kidney, except renal pelvis: Secondary | ICD-10-CM | POA: Diagnosis not present

## 2019-09-16 DIAGNOSIS — M129 Arthropathy, unspecified: Secondary | ICD-10-CM | POA: Diagnosis not present

## 2019-09-16 DIAGNOSIS — Z86010 Personal history of colonic polyps: Secondary | ICD-10-CM | POA: Diagnosis not present

## 2019-09-16 DIAGNOSIS — Z905 Acquired absence of kidney: Secondary | ICD-10-CM | POA: Diagnosis not present

## 2019-09-16 DIAGNOSIS — Z85528 Personal history of other malignant neoplasm of kidney: Secondary | ICD-10-CM | POA: Diagnosis not present

## 2019-09-16 DIAGNOSIS — Z87442 Personal history of urinary calculi: Secondary | ICD-10-CM | POA: Insufficient documentation

## 2019-09-16 DIAGNOSIS — R590 Localized enlarged lymph nodes: Secondary | ICD-10-CM | POA: Diagnosis not present

## 2019-09-16 DIAGNOSIS — I700 Atherosclerosis of aorta: Secondary | ICD-10-CM | POA: Diagnosis not present

## 2019-09-16 DIAGNOSIS — Z8614 Personal history of Methicillin resistant Staphylococcus aureus infection: Secondary | ICD-10-CM | POA: Insufficient documentation

## 2019-09-16 DIAGNOSIS — Z85038 Personal history of other malignant neoplasm of large intestine: Secondary | ICD-10-CM | POA: Diagnosis not present

## 2019-09-16 DIAGNOSIS — Z1509 Genetic susceptibility to other malignant neoplasm: Secondary | ICD-10-CM | POA: Diagnosis not present

## 2019-09-16 DIAGNOSIS — K760 Fatty (change of) liver, not elsewhere classified: Secondary | ICD-10-CM | POA: Diagnosis not present

## 2019-09-16 DIAGNOSIS — R160 Hepatomegaly, not elsewhere classified: Secondary | ICD-10-CM | POA: Insufficient documentation

## 2019-09-16 DIAGNOSIS — K8020 Calculus of gallbladder without cholecystitis without obstruction: Secondary | ICD-10-CM | POA: Diagnosis not present

## 2019-09-16 DIAGNOSIS — C8290 Follicular lymphoma, unspecified, unspecified site: Secondary | ICD-10-CM | POA: Insufficient documentation

## 2019-09-16 DIAGNOSIS — E538 Deficiency of other specified B group vitamins: Secondary | ICD-10-CM | POA: Insufficient documentation

## 2019-09-16 LAB — CBC WITH DIFFERENTIAL/PLATELET
Abs Immature Granulocytes: 0.06 10*3/uL (ref 0.00–0.07)
Basophils Absolute: 0.1 10*3/uL (ref 0.0–0.1)
Basophils Relative: 1 %
Eosinophils Absolute: 0.3 10*3/uL (ref 0.0–0.5)
Eosinophils Relative: 4 %
HCT: 40.3 % (ref 36.0–46.0)
Hemoglobin: 13 g/dL (ref 12.0–15.0)
Immature Granulocytes: 1 %
Lymphocytes Relative: 19 %
Lymphs Abs: 1.5 10*3/uL (ref 0.7–4.0)
MCH: 30.5 pg (ref 26.0–34.0)
MCHC: 32.3 g/dL (ref 30.0–36.0)
MCV: 94.6 fL (ref 80.0–100.0)
Monocytes Absolute: 0.8 10*3/uL (ref 0.1–1.0)
Monocytes Relative: 11 %
Neutro Abs: 5.1 10*3/uL (ref 1.7–7.7)
Neutrophils Relative %: 64 %
Platelets: 162 10*3/uL (ref 150–400)
RBC: 4.26 MIL/uL (ref 3.87–5.11)
RDW: 12.9 % (ref 11.5–15.5)
WBC: 7.8 10*3/uL (ref 4.0–10.5)
nRBC: 0 % (ref 0.0–0.2)

## 2019-09-16 LAB — COMPREHENSIVE METABOLIC PANEL
ALT: 16 U/L (ref 0–44)
AST: 16 U/L (ref 15–41)
Albumin: 3.6 g/dL (ref 3.5–5.0)
Alkaline Phosphatase: 65 U/L (ref 38–126)
Anion gap: 10 (ref 5–15)
BUN: 20 mg/dL (ref 8–23)
CO2: 25 mmol/L (ref 22–32)
Calcium: 9 mg/dL (ref 8.9–10.3)
Chloride: 103 mmol/L (ref 98–111)
Creatinine, Ser: 0.7 mg/dL (ref 0.44–1.00)
GFR calc Af Amer: >60 mL/min (ref >60)
GFR calc non Af Amer: >60 mL/min (ref >60)
Glucose, Bld: 104 mg/dL — ABNORMAL HIGH (ref 70–99)
Potassium: 4.1 mmol/L (ref 3.5–5.1)
Sodium: 138 mmol/L (ref 135–145)
Total Bilirubin: 0.6 mg/dL (ref 0.3–1.2)
Total Protein: 6.5 g/dL (ref 6.5–8.1)

## 2019-09-16 LAB — LACTATE DEHYDROGENASE: LDH: 115 U/L (ref 98–192)

## 2019-09-16 LAB — VITAMIN B12: Vitamin B-12: 252 pg/mL (ref 180–914)

## 2019-09-17 LAB — CEA: CEA: 2.4 ng/mL (ref 0.0–4.7)

## 2019-09-22 ENCOUNTER — Ambulatory Visit (HOSPITAL_COMMUNITY)
Admission: RE | Admit: 2019-09-22 | Discharge: 2019-09-22 | Disposition: A | Discharge status: Home / Self Care | Payer: PPO | Source: Ambulatory Visit | Attending: Hematology | Admitting: Hematology

## 2019-09-22 ENCOUNTER — Other Ambulatory Visit: Payer: Self-pay

## 2019-09-22 DIAGNOSIS — K7689 Other specified diseases of liver: Secondary | ICD-10-CM | POA: Diagnosis not present

## 2019-09-22 DIAGNOSIS — C642 Malignant neoplasm of left kidney, except renal pelvis: Secondary | ICD-10-CM

## 2019-09-22 DIAGNOSIS — N200 Calculus of kidney: Secondary | ICD-10-CM | POA: Diagnosis not present

## 2019-09-22 DIAGNOSIS — K760 Fatty (change of) liver, not elsewhere classified: Secondary | ICD-10-CM | POA: Diagnosis not present

## 2019-09-22 DIAGNOSIS — I700 Atherosclerosis of aorta: Secondary | ICD-10-CM | POA: Diagnosis not present

## 2019-09-22 MED ORDER — IOHEXOL 300 MG/ML  SOLN
100.0000 mL | Freq: Once | INTRAMUSCULAR | Status: AC | PRN
  Administered 2019-09-22: 100 mL via INTRAVENOUS

## 2019-09-28 ENCOUNTER — Inpatient Hospital Stay (HOSPITAL_COMMUNITY): Payer: PPO | Admitting: Hematology

## 2019-09-28 ENCOUNTER — Other Ambulatory Visit: Payer: Self-pay

## 2019-09-28 VITALS — BP 147/73 | HR 85 | Temp 97.3°F | Resp 18 | Wt 211.5 lb

## 2019-09-28 DIAGNOSIS — C642 Malignant neoplasm of left kidney, except renal pelvis: Secondary | ICD-10-CM | POA: Diagnosis not present

## 2019-09-28 NOTE — Progress Notes (Signed)
[place], [place] [address]   CLINIC:  Medical Oncology/Hematology  PCP:  Lemmie Evens, MD [place]. / [place] [address] [phone]   REASON FOR VISIT:  Follow-up for left renal cell carcinoma  PRIOR THERAPY: Left partial nephrectomy on 04/17/2005  NGS Results: None  CURRENT THERAPY: Surveillance  BRIEF ONCOLOGIC HISTORY:  Oncology History   No history exists.    CANCER STAGING: Cancer Staging No matching staging information was found for the patient.  INTERVAL HISTORY:  Nicole [name], a 68 y.o. female, returns for routine follow-up of her left renal cell carcinoma. Nicole [name] was last seen on 03/24/2019.  Today she reports feeling well and denies any new pains, hematuria, hematochezia, F/C, or weight loss.  She is due for a sigmoidoscopy, but has not scheduled one yet; she is anxious about getting a colostomy since her mother had one.   REVIEW OF SYSTEMS:  Review of Systems  Constitutional: Positive for fatigue (mild). Negative for appetite change, chills, fever and unexpected weight change.  Respiratory: Positive for cough.   Gastrointestinal: Negative for blood in stool.  Genitourinary: Negative for hematuria.   Musculoskeletal: Negative for arthralgias and myalgias.  All other systems reviewed and are negative.   PAST MEDICAL/SURGICAL HISTORY:  Past Medical History:  Diagnosis Date  . Arthritis    shorulder  . B12 deficiency   . Chronic diarrhea SECONDARY HX COLON CANCER  . History of colon cancer [facility] SYNDROME ---  COLORECTAL CANCER S/P COLECTOMY X3  LAST ONE 2007   NO RECURRENCE  . History of colon cancer 05/04/2013  . History of kidney infection 08/2013  . History of kidney stones   . History of MRSA infection 08/2013   left leg  . Hx of osteopenia   . Lynch syndrome 10/17/2010   Hereditary colon cancer  . NHL (non-Hodgkin's lymphoma) ([place]) 10/17/2010   MONITORED BY DR Tressie Stalker  . Personal  history of renal cancer S/P LEFT PARTIAL NEPHRECTOMY 2007--  NO RECURRENCE   DUE TO KIDNEY CANCER  . Right shoulder pain S/P ROTATOR CUFF REPAIR   Past Surgical History:  Procedure Laterality Date  . APPENDECTOMY  1993   W/ LAVH  . COLONOSCOPY W/ POLYPECTOMY    . CYSTOSCOPY W/ RETROGRADES  07/14/2014   Procedure: CYSTOSCOPY WITH RETROGRADE PYELOGRAM;  Surgeon: Franchot Gallo, MD;  Location: [facility];  Service: Urology;;  . Consuela Mimes W/ URETERAL STENT REMOVAL Left 07/14/2014   Procedure: CYSTOSCOPY WITH STENT REMOVAL;  Surgeon: Franchot Gallo, MD;  Location: [facility];  Service: Urology;  Laterality: Left;  . CYSTOSCOPY WITH STENT PLACEMENT  02/07/2012   Procedure: CYSTOSCOPY WITH STENT PLACEMENT;  Surgeon: Franchot Gallo, MD;  Location: [facility];  Service: Urology;  Laterality: Left;  . CYSTOSCOPY WITH STENT PLACEMENT Left 07/14/2014   Procedure: CYSTOSCOPY WITH STENT PLACEMENT;  Surgeon: Franchot Gallo, MD;  Location: [facility];  Service: Urology;  Laterality: Left;  . FLEXIBLE SIGMOIDOSCOPY  08/29/2011   Procedure: FLEXIBLE SIGMOIDOSCOPY;  Surgeon: Rogene Houston, MD;  [facility];  Service: Endoscopy;  Laterality: N/A;  930  . FLEXIBLE SIGMOIDOSCOPY N/A 09/28/2012   Procedure: FLEXIBLE SIGMOIDOSCOPY;  Surgeon: Rogene Houston, MD;  [facility];  Service: Endoscopy;  Laterality: N/A;  730  . FLEXIBLE SIGMOIDOSCOPY N/A 07/08/2014   Procedure: FLEXIBLE SIGMOIDOSCOPY;  Surgeon: Rogene Houston, MD;  [facility];  Service: Endoscopy;  Laterality: N/A;  830 - moved to 10:20 - Ann to notify pt  . FLEXIBLE SIGMOIDOSCOPY N/A 08/11/2015   Procedure: FLEXIBLE SIGMOIDOSCOPY;  Surgeon: Rogene Houston, MD;  [facility];  Service: Endoscopy;  Laterality: N/A;  730  . FLEXIBLE SIGMOIDOSCOPY N/A 08/13/2016   Procedure: FLEXIBLE SIGMOIDOSCOPY;  Surgeon: Rogene Houston, MD;   [facility];  Service: Endoscopy;  Laterality: N/A;  730  . HEMICOLECTOMY  2001   right  . HEMICOLECTOMY  2002   left  . HOLMIUM LASER APPLICATION Left 9/48/5462   Procedure: HOLMIUM LASER LITHOTRIPSY,;  Surgeon: Franchot Gallo, MD;  Location: [facility];  Service: Urology;  Laterality: Left;  . IR RADIOLOGIST EVAL & MGMT  04/29/2018  . kidney resection  2007   left partial  . LAPAROSCOPIC ASSISTED VAGINAL HYSTERECTOMY  1993   W/ BILATERAL SALPINGO-OOPHORECTOMY  . LEFT FLANK EXPLORATION W/  PARTIAL LEFT NEPHRECTOMY/ LEFT HILAR LYMPH NODE BX  04-17-2005  DR DAHLSTEDT   RENAL CELL CARCINOMA  . LITHOTRIPSY  11-22013   x2  . NEPHROLITHOTOMY Left 06/09/2014   Procedure: NEPHROLITHOTOMY PERCUTANEOUS;  Surgeon: Franchot Gallo, MD;  Location: [facility];  Service: Urology;  Laterality: Left;  with STENT  . RADIOLOGY WITH ANESTHESIA Left 05/20/2018   Procedure: CT WITH ANESTHESIA LEFT RENAL CRYOABLATION AND BIOPSY;  Surgeon: Greggory Keen, MD;  Location: [facility];  Service: Anesthesiology;  Laterality: Left;  . RIGHT ROTATOR  CUFF REPAIR/   BICEP REPAIR  10-17-2011  DR SUPPLE  . SUBTOTAL COLECTOMY  2007  . URETEROSCOPY WITH HOLMIUM LASER LITHOTRIPSY Left 07/14/2014   Procedure: URETEROSCOPY  EXTRACTION OF STONES;  Surgeon: Franchot Gallo, MD;  Location: [facility];  Service: Urology;  Laterality: Left;    SOCIAL HISTORY:  Social History   Socioeconomic History  . Marital status: Divorced    Spouse name: Not on file  . Number of children: Not on file  . Years of education: Not on file  . Highest education level: Not on file  Occupational History  . Not on file  Tobacco Use  . Smoking status: Never Smoker  . Smokeless tobacco: Never Used  Vaping Use  . Vaping Use: Never used  Substance and Sexual Activity  . Alcohol use: No  . Drug use: No  . Sexual activity: Not on file  Other Topics Concern  . Not on file  Social History Narrative  .  Not on file   Social Determinants of Health   Financial Resource Strain:   . Difficulty of Paying Living Expenses:   Food Insecurity:   . Worried About Charity fundraiser in the Last Year:   . Arboriculturist in the Last Year:   Transportation Needs:   . Film/video editor (Medical):   Marland Kitchen Lack of Transportation (Non-Medical):   Physical Activity:   . Days of Exercise per Week:   . Minutes of Exercise per Session:   Stress:   . Feeling of Stress :   Social Connections:   . Frequency of Communication with Friends and Family:   . Frequency of Social Gatherings with Friends and Family:   . Attends Religious Services:   . Active Member of Clubs or Organizations:   . Attends Archivist Meetings:   Marland Kitchen Marital Status:   Intimate Partner Violence:   . Fear of Current or Ex-Partner:   . Emotionally Abused:   Marland Kitchen Physically Abused:   . Sexually Abused:  FAMILY HISTORY:  Family History  Problem Relation Age of Onset  . Cancer Son     CURRENT MEDICATIONS:  Current Outpatient Medications  Medication Sig Dispense Refill  . Biotin 10000 MCG TABS Take 10,000 mcg by mouth daily.    . Cholecalciferol (VITAMIN D3) 125 MCG (5000 UT) CAPS Take 5,000 Units by mouth daily.    . cyanocobalamin (,VITAMIN B-12,) 1000 MCG/ML injection ADMINISTER 1 ML(1000 MCG) IN THE MUSCLE EVERY 21 DAYS 1 mL 17  . diphenoxylate-atropine (LOMOTIL) 2.5-0.025 MG tablet TAKE 2 TABLETS BY MOUTH THREE TIMES DAILY BEFORE MEALS (Patient not taking: No sig reported) 180 tablet 2  . ECHINACEA PO Take 1 tablet by mouth every other day.    . furosemide (LASIX) 40 MG tablet Take 40 mg by mouth daily as needed for fluid.    Marland Kitchen ibuprofen (ADVIL,MOTRIN) 800 MG tablet Take 800 mg by mouth 3 (three) times daily as needed for moderate pain.   11  . Potassium Citrate 15 MEQ (1620 MG) TBCR Take 1 tablet by mouth 3 (three) times daily. 270 tablet 3   No current facility-administered medications for this visit.     ALLERGIES:  Allergies  Allergen Reactions  . Latex Rash  . Morphine And Related Other (See Comments)    hallucinations  . Tape Rash    Certain tapes     PHYSICAL EXAM:  Performance status (ECOG): 1 - Symptomatic but completely ambulatory  There were no vitals filed for this visit. Wt Readings from Last 3 Encounters:  08/24/19 209 lb (94.8 kg)  05/27/19 209 lb (94.8 kg)  03/24/19 208 lb 8 oz (94.6 kg)   Physical Exam Vitals reviewed.  Constitutional:      Appearance: Normal appearance. She is obese.  Cardiovascular:     Rate and Rhythm: Normal rate and regular rhythm.     Pulses: Normal pulses.     Heart sounds: Normal heart sounds.  Pulmonary:     Effort: Pulmonary effort is normal.     Breath sounds: Normal breath sounds.  Chest:     Chest wall: No tenderness.  Abdominal:     Palpations: Abdomen is soft. There is no mass.     Tenderness: There is no abdominal tenderness.     Hernia: No hernia is present.  Lymphadenopathy:     Upper Body:     Right upper body: No supraclavicular or axillary adenopathy.     Left upper body: No supraclavicular or axillary adenopathy.     Lower Body: No right inguinal adenopathy. No left inguinal adenopathy.  Neurological:     General: No focal deficit present.     Mental Status: She is alert and oriented to person, place, and time.  Psychiatric:        Mood and Affect: Mood normal.        Behavior: Behavior normal.      LABORATORY DATA:  I have reviewed the labs as listed.  CBC Latest Ref Rng & Units 09/16/2019 03/17/2019 08/28/2018  WBC 4.0 - 10.5 K/uL 7.8 7.9 8.2  Hemoglobin 12.0 - 15.0 g/dL 13.0 12.9 13.3  Hematocrit 36 - 46 % 40.3 40.6 40.9  Platelets 150 - 400 K/uL 162 161 166   CMP Latest Ref Rng & Units 09/16/2019 03/17/2019 08/28/2018  Glucose 70 - 99 mg/dL 104(H) 109(H) 145(H)  BUN 8 - 23 mg/dL '20 19 22  '$ Creatinine 0.44 - 1.00 mg/dL 0.70 0.79 0.80  Sodium 135 - 145 mmol/L 138 138 137  Potassium 3.5 - 5.1 mmol/L  4.1 4.5 4.1  Chloride 98 - 111 mmol/L 103 103 104  CO2 22 - 32 mmol/L '25 23 25  '$ Calcium 8.9 - 10.3 mg/dL 9.0 9.3 8.9  Total Protein 6.5 - 8.1 g/dL 6.5 6.8 7.0  Total Bilirubin 0.3 - 1.2 mg/dL 0.6 0.6 0.7  Alkaline Phos 38 - 126 U/L 65 60 69  AST 15 - 41 U/L '16 19 19  '$ ALT 0 - 44 U/L '16 22 19   '$ Lab Results  Component Value Date   CEA1 2.4 09/16/2019   CEA1 2.8 03/17/2019   CEA1 2.5 08/28/2018   Lab Results  Component Value Date   LDH 115 09/16/2019   LDH 125 03/17/2019   LDH 135 08/28/2018    DIAGNOSTIC IMAGING:  I have independently reviewed the scans and discussed with the patient. CT ABDOMEN W WO CONTRAST  Result Date: 09/22/2019 CLINICAL DATA:  Status post left partial nephrectomy 2007 for renal cell carcinoma. Status post biopsy and cryoablation of left upper pole renal mass more recently on 05/20/2018. Past history of colon cancer. EXAM: CT ABDOMEN WITHOUT AND WITH CONTRAST TECHNIQUE: Multidetector CT imaging of the abdomen was performed following the standard protocol before and following the bolus administration of intravenous contrast. CONTRAST:  137m OMNIPAQUE IOHEXOL 300 MG/ML  SOLN COMPARISON:  03/22/2019 FINDINGS: Lower chest: Unremarkable. Hepatobiliary: The liver shows diffusely decreased attenuation suggesting fat deposition. Liver measures 18.4 cm craniocaudal length, enlarged. Calcified gallstones evident. No intrahepatic or extrahepatic biliary dilation. Pancreas: No focal mass lesion. No dilatation of the main duct. No intraparenchymal cyst. No peripancreatic edema. Spleen: No splenomegaly. No focal mass lesion. Adrenals/Urinary Tract: No adrenal nodule or mass. Partial nephrectomy defect noted in the lateral left kidney. Staghorn calculus in the lower pole left kidney is similar to prior. The cryo ablation defect in the upper pole left kidney is stable, including the small focus of increased attenuation (image 41/series 2) and the soft tissue nodular component  posteromedially Right kidney unremarkable with tiny nonobstructing stone in the upper pole. Stomach/Bowel: Stomach is unremarkable. No gastric wall thickening. No evidence of outlet obstruction. Duodenum is normally positioned as is the ligament of Treitz. Duodenal diverticulum noted. No small bowel or colonic dilatation within the visualized abdomen. Vascular/Lymphatic: There is abdominal aortic atherosclerosis without aneurysm. Retroperitoneal lymphadenopathy is stable. The index left para-aortic node measured previously at 1.3 cm is 1.3 cm today when measured in a similar fashion (image 54/series 4). No new or progressive lymphadenopathy in the abdomen Other: No intraperitoneal free fluid. Musculoskeletal: No worrisome lytic or sclerotic osseous abnormality. IMPRESSION: 1. Stable exam. No new or progressive findings. 2. Status post left partial nephrectomy with stable appearance of the cryo ablation defect in the upper pole left kidney. 3. Bilateral renal stones, stable. 4. Stable retroperitoneal lymphadenopathy. 5. Cholelithiasis. 6. Hepatomegaly with hepatic steatosis. 7. Aortic Atherosclerosis (ICD10-I70.0). Electronically Signed   By: EMisty StanleyM.D.   On: 09/22/2019 12:45     ASSESSMENT:  1.  Left renal cell carcinoma: -Left partial nephrectomy on 04/17/2005, stage T1 a clear-cell renal cell carcinoma, Fuhrman nuclear grade 3/4, tumor size 1.9 cm. -MRI of the abdomen on 04/21/2018 showed left upper pole renal lesion suspicious for recurrent renal cell cancer near the partial nephrectomy site.  Stable retrocrural and retroperitoneal lymphadenopathy. -Left renal mass biopsy and cryoablation on 05/20/2018, biopsy consistent with clear-cell renal cell carcinoma. -CT abdomen with and without contrast on 09/22/2019 shows left partial nephrectomy with stable appearance of  the cryoablation defect in the upper pole of the left kidney with no new or progressive findings.  Bilateral renal stones stable.  Stable  retroperitoneal adenopathy.  Hepatomegaly with steatosis.  2.  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 -TAH and BSO in 1993. -Flex sigmoidoscopy on 08/13/2016 with normal ileum, end-to-end ileocolonic anastomosis, normal rectosigmoid colon, normal rectum.  3.  Colon cancer: -Subtotal colectomy with ileorectal anastomosis.  No change in bowel habits.  4.  Follicular lymphoma: -Retroperitoneal adenopathy is stable.  No B symptoms.   PLAN:  1.  Left renal cell carcinoma: -We reviewed results of the CT abdomen from 09/22/2019.  No evidence of recurrence. -She will come back in 6 months with repeat CT scan.  2.  Lynch syndrome: -She was recommended to follow-up with GI for sigmoidoscopy.  We will make a referral.  3.  Colon cancer: -I have reviewed her labs which included normal CEA of 2.4.  No clinical signs or symptoms of recurrence.  4.  Follicular lymphoma: -No palpable adenopathy.  No B symptoms.  LDH was normal.  5.  B12 deficiency: -B12 is 252.  She will continue B12 injection every 3 weeks.  May change to every 2 weeks if feels tired.  6.  Health maintenance: -Bone density on 08/23/2019 shows T score -1.5, osteopenia.  Continue vitamin D3.    Orders placed this encounter:  No orders of the defined types were placed in this encounter.    Derek Jack, MD [place] [phone]   I, Milinda Antis, am acting as a scribe for Dr. Sanda Linger.  I, Derek Jack MD, have reviewed the above documentation for accuracy and completeness, and I agree with the above.

## 2019-09-28 NOTE — Patient Instructions (Signed)
[place] at [facility] Discharge Instructions  You were seen today by Dr. Delton Coombes. He went over your recent results and scans. Please schedule a sigmoidoscopy as soon as possible. You will be scheduled for a CT scan of your abdomen before your next visit. Dr. Delton Coombes will see you back in 6 months for labs and follow up.   Thank you for choosing [place] at [facility] to provide your oncology and hematology care.  To afford each patient quality time with our provider, please arrive at least 15 minutes before your scheduled appointment time.   If you have a lab appointment with the [facility] please come in thru the Main Entrance and check in at the main information desk  You need to re-schedule your appointment should you arrive 10 or more minutes late.  We strive to give you quality time with our providers, and arriving late affects you and other patients whose appointments are after yours.  Also, if you no show three or more times for appointments you may be dismissed from the clinic at the providers discretion.     Again, thank you for choosing [facility].  Our hope is that these requests will decrease the amount of time that you wait before being seen by our physicians.       _____________________________________________________________  Should you have questions after your visit to [facility], please contact our office at [phone] between the hours of 8:00 a.m. and 4:30 p.m.  Voicemails left after 4:00 p.m. will not be returned until the following business day.  For prescription refill requests, have your pharmacy contact our office and allow 72 hours.    Cancer Center Support Programs:   > Cancer Support Group  2nd Tuesday of the month 1pm-2pm, Journey Room

## 2019-09-29 ENCOUNTER — Encounter (INDEPENDENT_AMBULATORY_CARE_PROVIDER_SITE_OTHER): Payer: Self-pay

## 2019-09-29 ENCOUNTER — Other Ambulatory Visit (INDEPENDENT_AMBULATORY_CARE_PROVIDER_SITE_OTHER): Payer: Self-pay | Admitting: *Deleted

## 2019-09-29 DIAGNOSIS — Z85038 Personal history of other malignant neoplasm of large intestine: Secondary | ICD-10-CM

## 2019-10-04 ENCOUNTER — Other Ambulatory Visit: Payer: Self-pay

## 2019-10-04 ENCOUNTER — Encounter (HOSPITAL_COMMUNITY)
Admission: RE | Admit: 2019-10-04 | Discharge: 2019-10-04 | Disposition: A | Discharge status: Home / Self Care | Payer: PPO | Source: Ambulatory Visit | Attending: Internal Medicine | Admitting: Internal Medicine

## 2019-10-04 DIAGNOSIS — Z01812 Encounter for preprocedural laboratory examination: Secondary | ICD-10-CM | POA: Diagnosis not present

## 2019-10-04 DIAGNOSIS — Z20822 Contact with and (suspected) exposure to covid-19: Secondary | ICD-10-CM | POA: Diagnosis not present

## 2019-10-04 LAB — SARS CORONAVIRUS 2 (TAT 6-24 HRS): SARS Coronavirus 2: NEGATIVE

## 2019-10-06 ENCOUNTER — Other Ambulatory Visit: Payer: Self-pay

## 2019-10-06 ENCOUNTER — Encounter (HOSPITAL_COMMUNITY)
Admission: RE | Disposition: A | Discharge status: Home / Self Care | Payer: Self-pay | Source: Home / Self Care | Attending: Internal Medicine

## 2019-10-06 ENCOUNTER — Ambulatory Visit (HOSPITAL_COMMUNITY)
Admission: RE | Admit: 2019-10-06 | Discharge: 2019-10-06 | Disposition: A | Discharge status: Home / Self Care | Payer: PPO | Attending: Internal Medicine | Admitting: Internal Medicine

## 2019-10-06 ENCOUNTER — Encounter (HOSPITAL_COMMUNITY): Payer: Self-pay | Admitting: Internal Medicine

## 2019-10-06 DIAGNOSIS — Z79899 Other long term (current) drug therapy: Secondary | ICD-10-CM | POA: Insufficient documentation

## 2019-10-06 DIAGNOSIS — Z85038 Personal history of other malignant neoplasm of large intestine: Secondary | ICD-10-CM | POA: Diagnosis not present

## 2019-10-06 DIAGNOSIS — Z8614 Personal history of Methicillin resistant Staphylococcus aureus infection: Secondary | ICD-10-CM | POA: Insufficient documentation

## 2019-10-06 DIAGNOSIS — M8580 Other specified disorders of bone density and structure, unspecified site: Secondary | ICD-10-CM | POA: Diagnosis not present

## 2019-10-06 DIAGNOSIS — Z800 Family history of malignant neoplasm of digestive organs: Secondary | ICD-10-CM | POA: Insufficient documentation

## 2019-10-06 DIAGNOSIS — Z980 Intestinal bypass and anastomosis status: Secondary | ICD-10-CM | POA: Diagnosis not present

## 2019-10-06 DIAGNOSIS — Z85528 Personal history of other malignant neoplasm of kidney: Secondary | ICD-10-CM | POA: Insufficient documentation

## 2019-10-06 DIAGNOSIS — Z8572 Personal history of non-Hodgkin lymphomas: Secondary | ICD-10-CM | POA: Insufficient documentation

## 2019-10-06 DIAGNOSIS — Z1509 Genetic susceptibility to other malignant neoplasm: Secondary | ICD-10-CM | POA: Insufficient documentation

## 2019-10-06 DIAGNOSIS — M1990 Unspecified osteoarthritis, unspecified site: Secondary | ICD-10-CM | POA: Diagnosis not present

## 2019-10-06 DIAGNOSIS — Z905 Acquired absence of kidney: Secondary | ICD-10-CM | POA: Insufficient documentation

## 2019-10-06 DIAGNOSIS — Z1211 Encounter for screening for malignant neoplasm of colon: Secondary | ICD-10-CM | POA: Diagnosis not present

## 2019-10-06 DIAGNOSIS — K648 Other hemorrhoids: Secondary | ICD-10-CM | POA: Diagnosis not present

## 2019-10-06 DIAGNOSIS — Z08 Encounter for follow-up examination after completed treatment for malignant neoplasm: Secondary | ICD-10-CM | POA: Diagnosis not present

## 2019-10-06 DIAGNOSIS — Z8507 Personal history of malignant neoplasm of pancreas: Secondary | ICD-10-CM | POA: Insufficient documentation

## 2019-10-06 DIAGNOSIS — Z87442 Personal history of urinary calculi: Secondary | ICD-10-CM | POA: Insufficient documentation

## 2019-10-06 HISTORY — PX: FLEXIBLE SIGMOIDOSCOPY: SHX5431

## 2019-10-06 SURGERY — SIGMOIDOSCOPY, FLEXIBLE
Anesthesia: Moderate Sedation

## 2019-10-06 MED ORDER — STERILE WATER FOR IRRIGATION IR SOLN
Status: DC | PRN
  Administered 2019-10-06: 2.5 mL

## 2019-10-06 MED ORDER — MIDAZOLAM HCL 5 MG/5ML IJ SOLN
INTRAMUSCULAR | Status: AC
  Filled 2019-10-06: qty 10

## 2019-10-06 MED ORDER — MEPERIDINE HCL 50 MG/ML IJ SOLN
INTRAMUSCULAR | Status: AC
  Filled 2019-10-06: qty 1

## 2019-10-06 MED ORDER — SODIUM CHLORIDE 0.9 % IV SOLN: INTRAVENOUS | Status: DC

## 2019-10-06 NOTE — Discharge Instructions (Signed)
Resume usual medications and diet as before. Next sigmoidoscopy in 2 years.   Flexible Sigmoidoscopy, Care After This sheet gives you information about how to care for yourself after your procedure. Your health care provider may also give you more specific instructions. If you have problems or questions, contact your health care provider.  Dr. Laural Golden, [phone] What can I expect after the procedure? After the procedure, it is common to have:  Abdominal cramping or pain.  Bloating.  A small amount of rectal bleeding if you had a biopsy. Follow these instructions at home:   Keep all follow-up visits as told by your health care provider. This is important. Contact a health care provider if:  You have abdominal pain or cramping that gets worse or is not helped with medicine.  You continue to have small amounts of rectal bleeding after 24 hours.  You have nausea or vomiting.  You feel weak or dizzy.  You have a fever. Get help right away if:  You pass large blood clots or see a large amount of blood in the toilet after having a bowel movement.  You have nausea or vomiting for more than 24 hours after the procedure. This information is not intended to replace advice given to you by your health care provider. Make sure you discuss any questions you have with your health care provider. Document Revised: 10/26/2015 Document Reviewed: 06/03/2015 Elsevier Patient Education  [place].

## 2019-10-06 NOTE — Op Note (Signed)
[facility] Patient Name: Nicole [name] Procedure Date: 10/06/2019 12:25 PM MRN: [id number] Date of Birth: 12-01-1950 Attending MD: Hildred Laser , MD CSN: [id number] Age: 69 Admit Type: Outpatient Procedure:                Flexible Sigmoidoscopy Indications:              High risk colon cancer surveillance: Personal                            history of colon cancer, High risk colon cancer                            surveillance: Personal history of hereditary                            nonpolyposis colorectal cancer (Lynch Syndrome) Providers:                Hildred Laser, MD, Charlsie Quest. Theda Sers RN, RN, Lurline Del, RN, Crystal Page, Randa Spike, Technician Referring MD:             Lemmie Evens, MD Medicines:                None Complications:            No immediate complications. Estimated Blood Loss:     Estimated blood loss: none. Procedure:                Pre-Anesthesia Assessment:                           - Prior to the procedure, a History and Physical                            was performed, and patient medications and                            allergies were reviewed. The patient's tolerance of                            previous anesthesia was also reviewed. The risks                            and benefits of the procedure and the sedation                            options and risks were discussed with the patient.                            All questions were answered, and informed consent                            was obtained. Prior Anticoagulants: The patient has  taken no previous anticoagulant or antiplatelet                            agents except for NSAID medication. ASA Grade                            Assessment: II - A patient with mild systemic                            disease. After reviewing the risks and benefits,                            the patient was deemed in satisfactory  condition to                            undergo the procedure.                           After obtaining informed consent, the scope was                            passed under direct vision. The [id number]-[id number]                            ([id number]) scope was introduced through the anus and                            advanced to the 40 cm from the anal verge. The                            flexible sigmoidoscopy was accomplished without                            difficulty. The patient tolerated the procedure                            well. The quality of the bowel preparation was                            excellent. Scope In: 12:45:49 PM Scope Out: 12:51:47 PM Total Procedure Duration: 0 hours 5 minutes 58 seconds  Findings:      The perianal and digital rectal examinations were normal.      The ileum appeared normal.      There was evidence of a prior end-to-end ileo-rectal anastomosis in the       recto-sigmoid colon. This was patent and was characterized by healthy       appearing mucosa.      The rectum and recto-sigmoid colon appeared normal.      Internal hemorrhoids were found during retroflexion. The hemorrhoids       were small. Impression:               - The terminal ileum is normal.                           -  Patent end-to-end ileo-rectal anastomosis,                            characterized by healthy appearing mucosa.                           - The rectum and recto-sigmoid colon are normal.                           - Internal hemorrhoids.                           - No specimens collected. Moderate Sedation:      no sedation given but vital signs monitored Recommendation:           - Discharge patient to home (ambulatory).                           - Continue present medications.                           - Repeat flexible sigmoidoscopy in 2 years for                            surveillance. Procedure Code(s):        --- Professional ---                            [phone], Sigmoidoscopy, flexible; diagnostic,                            including collection of specimen(s) by brushing or                            washing, when performed (separate procedure) Diagnosis Code(s):        --- Professional ---                           G64.403, Personal history of other malignant                            neoplasm of large intestine                           Z15.09, Genetic susceptibility to other malignant                            neoplasm                           K64.8, Other hemorrhoids                           Z98.0, Intestinal bypass and anastomosis status CPT copyright 2019 American Medical Association. All rights reserved. The codes documented in this report are preliminary and upon coder review may  be revised to meet current compliance requirements. Hildred Laser, MD Hildred Laser, MD 10/06/2019 1:02:29 PM This report has been  signed electronically. Number of Addenda: 0

## 2019-10-06 NOTE — H&P (Signed)
Nicole [name] is an 69 y.o. female.   Chief Complaint: Patient is here for flexible sigmoidoscopy. HPI: Patient is 69 year old Caucasian female who is history of colon carcinoma at 2 different times was eventually diagnosed with Lynch syndrome who also has history of renal cell carcinoma as well as lymphoma who opted not to have her rectum taken out at her's second surgery.  She has been undergoing yearly flexible sigmoidoscopies but last one was over 3 years ago.  Denies abdominal pain melena or rectal bleeding.  Her appetite is good and her weight has been stable.  She generally has 3-4 bowel movements per day which is her baseline since colon surgery. Family history significant for colon carcinoma and mother and maternal aunt and her son died in his early 45s.  He had duodenal/pancreatic carcinoma at 33 followed by lung carcinoma and colon carcinoma in his early 35s.  Past Medical History:  Diagnosis Date  . Arthritis    shorulder  . B12 deficiency   . Chronic diarrhea SECONDARY HX COLON CANCER  . History of colon cancer [facility] SYNDROME ---  COLORECTAL CANCER S/P COLECTOMY X3  LAST ONE 2007   NO RECURRENCE  . History of colon cancer 05/04/2013  . History of kidney infection 08/2013  . History of kidney stones   . History of MRSA infection 08/2013   left leg  . Hx of osteopenia   . Lynch syndrome 10/17/2010   Hereditary colon cancer  . NHL (non-Hodgkin's lymphoma) ([facility]) 10/17/2010   MONITORED BY DR Tressie Stalker  . Personal history of renal cancer S/P LEFT PARTIAL NEPHRECTOMY 2007--  NO RECURRENCE   DUE TO KIDNEY CANCER  . Right shoulder pain S/P ROTATOR CUFF REPAIR    Past Surgical History:  Procedure Laterality Date  . ABDOMINAL HYSTERECTOMY    . APPENDECTOMY  1993   W/ LAVH  . COLONOSCOPY W/ POLYPECTOMY    . CYSTOSCOPY W/ RETROGRADES  07/14/2014   Procedure: CYSTOSCOPY WITH RETROGRADE PYELOGRAM;  Surgeon: Franchot Gallo, MD;  Location: [facility];  Service: Urology;;   . Consuela Mimes W/ URETERAL STENT REMOVAL Left 07/14/2014   Procedure: CYSTOSCOPY WITH STENT REMOVAL;  Surgeon: Franchot Gallo, MD;  Location: [facility];  Service: Urology;  Laterality: Left;  . CYSTOSCOPY WITH STENT PLACEMENT  02/07/2012   Procedure: CYSTOSCOPY WITH STENT PLACEMENT;  Surgeon: Franchot Gallo, MD;  Location: [facility];  Service: Urology;  Laterality: Left;  . CYSTOSCOPY WITH STENT PLACEMENT Left 07/14/2014   Procedure: CYSTOSCOPY WITH STENT PLACEMENT;  Surgeon: Franchot Gallo, MD;  Location: [facility];  Service: Urology;  Laterality: Left;  . FLEXIBLE SIGMOIDOSCOPY  08/29/2011   Procedure: FLEXIBLE SIGMOIDOSCOPY;  Surgeon: Rogene Houston, MD;  [facility];  Service: Endoscopy;  Laterality: N/A;  930  . FLEXIBLE SIGMOIDOSCOPY N/A 09/28/2012   Procedure: FLEXIBLE SIGMOIDOSCOPY;  Surgeon: Rogene Houston, MD;  [facility];  Service: Endoscopy;  Laterality: N/A;  730  . FLEXIBLE SIGMOIDOSCOPY N/A 07/08/2014   Procedure: FLEXIBLE SIGMOIDOSCOPY;  Surgeon: Rogene Houston, MD;  [facility];  Service: Endoscopy;  Laterality: N/A;  830 - moved to 10:20 - Ann to notify pt  . FLEXIBLE SIGMOIDOSCOPY N/A 08/11/2015   Procedure: FLEXIBLE SIGMOIDOSCOPY;  Surgeon: Rogene Houston, MD;  [facility];  Service: Endoscopy;  Laterality: N/A;  730  . FLEXIBLE SIGMOIDOSCOPY N/A 08/13/2016   Procedure: FLEXIBLE SIGMOIDOSCOPY;  Surgeon: Rogene Houston, MD;  [facility];  Service: Endoscopy;  Laterality: N/A;  730  . HEMICOLECTOMY  2001   right  . HEMICOLECTOMY  2002   left  . HOLMIUM LASER APPLICATION Left 05/31/4006   Procedure: HOLMIUM LASER LITHOTRIPSY,;  Surgeon: Franchot Gallo, MD;  Location: [facility];  Service: Urology;  Laterality: Left;  . IR RADIOLOGIST EVAL & MGMT  04/29/2018  . kidney resection  2007   left partial  . LAPAROSCOPIC ASSISTED VAGINAL  HYSTERECTOMY  1993   W/ BILATERAL SALPINGO-OOPHORECTOMY  . LEFT FLANK EXPLORATION W/  PARTIAL LEFT NEPHRECTOMY/ LEFT HILAR LYMPH NODE BX  04-17-2005  DR DAHLSTEDT   RENAL CELL CARCINOMA  . LITHOTRIPSY  11-22013   x2  . NEPHROLITHOTOMY Left 06/09/2014   Procedure: NEPHROLITHOTOMY PERCUTANEOUS;  Surgeon: Franchot Gallo, MD;  [facility];  Service: Urology;  Laterality: Left;  with STENT  . RADIOLOGY WITH ANESTHESIA Left 05/20/2018   Procedure: CT WITH ANESTHESIA LEFT RENAL CRYOABLATION AND BIOPSY;  Surgeon: Greggory Keen, MD;  Location[facility];  Service: Anesthesiology;  Laterality: Left;  . RIGHT ROTATOR  CUFF REPAIR/   BICEP REPAIR  10-17-2011  DR SUPPLE  . SUBTOTAL COLECTOMY  2007  . URETEROSCOPY WITH HOLMIUM LASER LITHOTRIPSY Left 07/14/2014   Procedure: URETEROSCOPY  EXTRACTION OF STONES;  Surgeon: Franchot Gallo, MD;  Location: [facility];  Service: Urology;  Laterality: Left;    Family History  Problem Relation Age of Onset  . Cancer Son    Social History:  reports that she has never smoked. She has never used smokeless tobacco. She reports current alcohol use. She reports that she does not use drugs.  Allergies:  Allergies  Allergen Reactions  . Latex Rash  . Morphine And Related Other (See Comments)    hallucinations  . Tape Rash    Certain tapes     Medications Prior to Admission  Medication Sig Dispense Refill  . Biotin 10000 MCG TABS Take 10,000 mcg by mouth daily.    . Cholecalciferol (VITAMIN D3) 125 MCG (5000 UT) CAPS Take 5,000 Units by mouth daily.    . cyanocobalamin (,VITAMIN B-12,) 1000 MCG/ML injection ADMINISTER 1 ML(1000 MCG) IN THE MUSCLE EVERY 21 DAYS (Patient taking differently: Inject 1,000 mcg into the muscle every 21 ( twenty-one) days. ) 1 mL 17  . furosemide (LASIX) 40 MG tablet Take 40 mg by mouth daily as needed for fluid.     Marland Kitchen ibuprofen (ADVIL,MOTRIN) 800 MG tablet Take 800 mg by mouth 3 (three) times daily as needed for  moderate pain.   11  . milk thistle 175 MG tablet Take 1,000 mg by mouth daily.    . Potassium Citrate 15 MEQ (1620 MG) TBCR Take 1 tablet by mouth 3 (three) times daily. 270 tablet 3  . diphenoxylate-atropine (LOMOTIL) 2.5-0.025 MG tablet Take 1 tablet by mouth 4 (four) times daily as needed for diarrhea or loose stools.      No results found for this or any previous visit (from the past 48 hour(s)). No results found.  Review of Systems  Blood pressure (!) 146/79, pulse 90, temperature 98.4 F (36.9 C), temperature source Oral, resp. rate 15, height 5\' 5"  (1.651 m), weight 94.8 kg, SpO2 97 %. Physical Exam HENT:     Mouth/Throat:     Mouth: Mucous membranes are moist.     Pharynx: Oropharynx is clear.  Eyes:     General: No scleral icterus.    Conjunctiva/sclera: Conjunctivae normal.  Cardiovascular:     Rate and Rhythm: Normal rate and regular rhythm.     Heart sounds: Normal heart sounds. No murmur heard.   Pulmonary:     Effort: Pulmonary effort is normal.     Breath sounds: Normal breath sounds.  Abdominal:     Comments: Abdomen is full.  She has long midline scar.  Abdomen is soft and nontender with organomegaly or masses.  Musculoskeletal:        General: No swelling.     Cervical back: Neck supple.  Lymphadenopathy:     Cervical: No cervical adenopathy.  Skin:    General: Skin is warm and dry.  Neurological:     Mental Status: She is alert.  Psychiatric:        Mood and Affect: Mood normal.      Assessment/Plan History of colon carcinoma. Lynch syndrome. Patient is status post subtotal colectomy and has distal sigmoid colon and rectum in situ. Surveillance flexible sigmoidoscopy.  Hildred Laser, MD 10/06/2019, 12:38 PM

## 2019-10-07 ENCOUNTER — Encounter: Payer: Self-pay | Admitting: *Deleted

## 2019-10-07 ENCOUNTER — Ambulatory Visit
Admission: RE | Admit: 2019-10-07 | Discharge: 2019-10-07 | Disposition: A | Discharge status: Home / Self Care | Payer: PPO | Source: Ambulatory Visit | Attending: Interventional Radiology | Admitting: Interventional Radiology

## 2019-10-07 DIAGNOSIS — N200 Calculus of kidney: Secondary | ICD-10-CM | POA: Diagnosis not present

## 2019-10-07 DIAGNOSIS — Z85528 Personal history of other malignant neoplasm of kidney: Secondary | ICD-10-CM | POA: Diagnosis not present

## 2019-10-07 DIAGNOSIS — Z9889 Other specified postprocedural states: Secondary | ICD-10-CM | POA: Diagnosis not present

## 2019-10-07 DIAGNOSIS — C642 Malignant neoplasm of left kidney, except renal pelvis: Secondary | ICD-10-CM

## 2019-10-07 HISTORY — PX: IR RADIOLOGIST EVAL & MGMT: IMG5224

## 2019-10-07 NOTE — Progress Notes (Signed)
Patient ID: Nicole [name], female   DOB: 04-18-1950, 69 y.o.   MRN: [id number]       Chief Complaint:  Left renal cell carcinoma  Referring Physician(s): Mkenzie Dotts  History of Present Illness: SPRUHA WEIGHT is a 69 y.o. female with a prior history of colon cancer, non-Hodgkin's lymphoma, and left renal cell carcinoma.  She underwent a partial left nephrectomy in 2007.  Surveillance imaging back in January 2020 revealed a left upper pole solid enhancing renal mass concerning for recurrent renal cell carcinoma along the superior surgical margin.  Lesion measured up to 1.8 cm.  She underwent successful image guided biopsy and cryoablation 05/20/2018.  Telehealth visit today for review of surveillance imaging.  She has been doing very well.  No current flank or abdominal pain.  No other urinary tract symptoms, fever or illness.  Surveillance imaging performed 09/22/2019 [facility] demonstrates a stable ablation defect as well as left partial nephrectomy site.  No signs of residual or recurrent disease.  No new renal abnormality.  Bilateral nephrolithiasis again noted.  No hydronephrosis.  Gallstones also evident.  Past Medical History:  Diagnosis Date  . Arthritis    shorulder  . B12 deficiency   . Chronic diarrhea SECONDARY HX COLON CANCER  . History of colon cancer [facility] SYNDROME ---  COLORECTAL CANCER S/P COLECTOMY X3  LAST ONE 2007   NO RECURRENCE  . History of colon cancer 05/04/2013  . History of kidney infection 08/2013  . History of kidney stones   . History of MRSA infection 08/2013   left leg  . Hx of osteopenia   . Lynch syndrome 10/17/2010   Hereditary colon cancer  . NHL (non-Hodgkin's lymphoma) ([place]) 10/17/2010   MONITORED BY DR Tressie Stalker  . Personal history of renal cancer S/P LEFT PARTIAL NEPHRECTOMY 2007--  NO RECURRENCE   DUE TO KIDNEY CANCER  . Right shoulder pain S/P ROTATOR CUFF REPAIR    Past Surgical History:  Procedure Laterality Date  . ABDOMINAL  HYSTERECTOMY    . APPENDECTOMY  1993   W/ LAVH  . COLONOSCOPY W/ POLYPECTOMY    . CYSTOSCOPY W/ RETROGRADES  07/14/2014   Procedure: CYSTOSCOPY WITH RETROGRADE PYELOGRAM;  Surgeon: Franchot Gallo, MD;  Location: [facility];  Service: Urology;;  . Consuela Mimes W/ URETERAL STENT REMOVAL Left 07/14/2014   Procedure: CYSTOSCOPY WITH STENT REMOVAL;  Surgeon: Franchot Gallo, MD;  Location: [facility];  Service: Urology;  Laterality: Left;  . CYSTOSCOPY WITH STENT PLACEMENT  02/07/2012   Procedure: CYSTOSCOPY WITH STENT PLACEMENT;  Surgeon: Franchot Gallo, MD;  Location: [facility];  Service: Urology;  Laterality: Left;  . CYSTOSCOPY WITH STENT PLACEMENT Left 07/14/2014   Procedure: CYSTOSCOPY WITH STENT PLACEMENT;  Surgeon: Franchot Gallo, MD;  Location: [facility];  Service: Urology;  Laterality: Left;  . FLEXIBLE SIGMOIDOSCOPY  08/29/2011   Procedure: FLEXIBLE SIGMOIDOSCOPY;  Surgeon: Rogene Houston, MD;  [facility];  Service: Endoscopy;  Laterality: N/A;  930  . FLEXIBLE SIGMOIDOSCOPY N/A 09/28/2012   Procedure: FLEXIBLE SIGMOIDOSCOPY;  Surgeon: Rogene Houston, MD;  [facility];  Service: Endoscopy;  Laterality: N/A;  730  . FLEXIBLE SIGMOIDOSCOPY N/A 07/08/2014   Procedure: FLEXIBLE SIGMOIDOSCOPY;  Surgeon: Rogene Houston, MD;  [facility];  Service: Endoscopy;  Laterality: N/A;  830 - moved to 10:20 - Ann to notify pt  . FLEXIBLE SIGMOIDOSCOPY N/A 08/11/2015   Procedure: FLEXIBLE SIGMOIDOSCOPY;  Surgeon: Rogene Houston, MD;  [facility];  Service: Endoscopy;  Laterality: N/A;  730  . FLEXIBLE SIGMOIDOSCOPY N/A 08/13/2016   Procedure: FLEXIBLE SIGMOIDOSCOPY;  Surgeon: Rogene Houston, MD;  [facility];  Service: Endoscopy;  Laterality: N/A;  730  . HEMICOLECTOMY  2001   right  . HEMICOLECTOMY  2002   left  . HOLMIUM LASER APPLICATION Left 6/33/3545   Procedure:  HOLMIUM LASER LITHOTRIPSY,;  Surgeon: Franchot Gallo, MD;  Location: [facility];  Service: Urology;  Laterality: Left;  . IR RADIOLOGIST EVAL & MGMT  04/29/2018  . kidney resection  2007   left partial  . LAPAROSCOPIC ASSISTED VAGINAL HYSTERECTOMY  1993   W/ BILATERAL SALPINGO-OOPHORECTOMY  . LEFT FLANK EXPLORATION W/  PARTIAL LEFT NEPHRECTOMY/ LEFT HILAR LYMPH NODE BX  04-17-2005  DR DAHLSTEDT   RENAL CELL CARCINOMA  . LITHOTRIPSY  11-22013   x2  . NEPHROLITHOTOMY Left 06/09/2014   Procedure: NEPHROLITHOTOMY PERCUTANEOUS;  Surgeon: Franchot Gallo, MD;  [facility];  Service: Urology;  Laterality: Left;  with STENT  . RADIOLOGY WITH ANESTHESIA Left 05/20/2018   Procedure: CT WITH ANESTHESIA LEFT RENAL CRYOABLATION AND BIOPSY;  Surgeon: Greggory Keen, MD;  [facility];  Service: Anesthesiology;  Laterality: Left;  . RIGHT ROTATOR  CUFF REPAIR/   BICEP REPAIR  10-17-2011  DR SUPPLE  . SUBTOTAL COLECTOMY  2007  . URETEROSCOPY WITH HOLMIUM LASER LITHOTRIPSY Left 07/14/2014   Procedure: URETEROSCOPY  EXTRACTION OF STONES;  Surgeon: Franchot Gallo, MD;  Location: [facility];  Service: Urology;  Laterality: Left;    Allergies: Latex, Morphine and related, and Tape  Medications: Prior to Admission medications   Medication Sig Start Date End Date Taking? Authorizing Provider  Biotin 10000 MCG TABS Take 10,000 mcg by mouth daily.    [provider]  Cholecalciferol (VITAMIN D3) 125 MCG (5000 UT) CAPS Take 5,000 Units by mouth daily.    [provider]  cyanocobalamin (,VITAMIN B-12,) 1000 MCG/ML injection ADMINISTER 1 ML(1000 MCG) IN THE MUSCLE EVERY 21 DAYS Patient taking differently: Inject 1,000 mcg into the muscle every 21 ( twenty-one) days.  07/06/19   Derek Jack, MD  diphenoxylate-atropine (LOMOTIL) 2.5-0.025 MG tablet Take 1 tablet by mouth 4 (four) times daily as needed for diarrhea or loose stools.    [provider]  furosemide (LASIX) 40 MG tablet Take 40 mg by mouth daily as needed for fluid.     [provider]  ibuprofen (ADVIL,MOTRIN) 800 MG tablet Take 800 mg by mouth 3 (three) times daily as needed for moderate pain.  08/01/16   [provider]  milk thistle 175 MG tablet Take 1,000 mg by mouth daily.    [provider]  Potassium Citrate 15 MEQ (1620 MG) TBCR Take 1 tablet by mouth 3 (three) times daily. 07/09/19   Franchot Gallo, MD     Family History  Problem Relation Age of Onset  . Cancer Son     Social History   Socioeconomic History  . Marital status: Divorced    Spouse name: Not on file  . Number of children: Not on file  . Years of education: Not on file  . Highest education level: Not on file  Occupational History  . Not on file  Tobacco Use  . Smoking status: Never Smoker  . Smokeless tobacco: Never Used  Vaping Use  . Vaping Use: Never used  Substance and Sexual Activity  .  Alcohol use: Yes    Comment: 2 glasses of wine per year - maybe  . Drug use: No  . Sexual activity: Not on file  Other Topics Concern  . Not on file  Social History Narrative  . Not on file   Social Determinants of Health   Financial Resource Strain:   . Difficulty of Paying Living Expenses:   Food Insecurity:   . Worried About Charity fundraiser in the Last Year:   . Arboriculturist in the Last Year:   Transportation Needs:   . Film/video editor (Medical):   Marland Kitchen Lack of Transportation (Non-Medical):   Physical Activity:   . Days of Exercise per Week:   . Minutes of Exercise per Session:   Stress:   . Feeling of Stress :   Social Connections:   . Frequency of Communication with Friends and Family:   . Frequency of Social Gatherings with Friends and Family:   . Attends Religious Services:   . Active Member of Clubs or Organizations:   . Attends Archivist Meetings:   Marland Kitchen Marital Status:      Review of Systems  Review of  Systems: A 12 point ROS discussed and pertinent positives are indicated in the HPI above.  All other systems are negative.  Physical Exam No direct physical exam was performed, telephone health visit only because of Covid pandemic Vital Signs: There were no vitals taken for this visit.  Imaging: CT ABDOMEN W WO CONTRAST  Result Date: 09/22/2019 CLINICAL DATA:  Status post left partial nephrectomy 2007 for renal cell carcinoma. Status post biopsy and cryoablation of left upper pole renal mass more recently on 05/20/2018. Past history of colon cancer. EXAM: CT ABDOMEN WITHOUT AND WITH CONTRAST TECHNIQUE: Multidetector CT imaging of the abdomen was performed following the standard protocol before and following the bolus administration of intravenous contrast. CONTRAST:  150mL OMNIPAQUE IOHEXOL 300 MG/ML  SOLN COMPARISON:  03/22/2019 FINDINGS: Lower chest: Unremarkable. Hepatobiliary: The liver shows diffusely decreased attenuation suggesting fat deposition. Liver measures 18.4 cm craniocaudal length, enlarged. Calcified gallstones evident. No intrahepatic or extrahepatic biliary dilation. Pancreas: No focal mass lesion. No dilatation of the main duct. No intraparenchymal cyst. No peripancreatic edema. Spleen: No splenomegaly. No focal mass lesion. Adrenals/Urinary Tract: No adrenal nodule or mass. Partial nephrectomy defect noted in the lateral left kidney. Staghorn calculus in the lower pole left kidney is similar to prior. The cryo ablation defect in the upper pole left kidney is stable, including the small focus of increased attenuation (image 41/series 2) and the soft tissue nodular component posteromedially Right kidney unremarkable with tiny nonobstructing stone in the upper pole. Stomach/Bowel: Stomach is unremarkable. No gastric wall thickening. No evidence of outlet obstruction. Duodenum is normally positioned as is the ligament of Treitz. Duodenal diverticulum noted. No small bowel or colonic  dilatation within the visualized abdomen. Vascular/Lymphatic: There is abdominal aortic atherosclerosis without aneurysm. Retroperitoneal lymphadenopathy is stable. The index left para-aortic node measured previously at 1.3 cm is 1.3 cm today when measured in a similar fashion (image 54/series 4). No new or progressive lymphadenopathy in the abdomen Other: No intraperitoneal free fluid. Musculoskeletal: No worrisome lytic or sclerotic osseous abnormality. IMPRESSION: 1. Stable exam. No new or progressive findings. 2. Status post left partial nephrectomy with stable appearance of the cryo ablation defect in the upper pole left kidney. 3. Bilateral renal stones, stable. 4. Stable retroperitoneal lymphadenopathy. 5. Cholelithiasis. 6. Hepatomegaly with hepatic steatosis. 7. Aortic  Atherosclerosis (ICD10-I70.0). Electronically Signed   By: Misty Stanley M.D.   On: 09/22/2019 12:45    Labs:  CBC: Recent Labs    03/17/19 0919 09/16/19 0951  WBC 7.9 7.8  HGB 12.9 13.0  HCT 40.6 40.3  PLT 161 162    COAGS: No results for input(s): INR, APTT in the last 8760 hours.  BMP: Recent Labs    03/17/19 0919 09/16/19 0951  NA 138 138  K 4.5 4.1  CL 103 103  CO2 23 25  GLUCOSE 109* 104*  BUN 19 20  CALCIUM 9.3 9.0  CREATININE 0.79 0.70  GFRNONAA >60 >60  GFRAA >60 >60    LIVER FUNCTION TESTS: Recent Labs    03/17/19 0919 09/16/19 0951  BILITOT 0.6 0.6  AST 19 16  ALT 22 16  ALKPHOS 60 65  PROT 6.8 6.5  ALBUMIN 3.8 3.6    TUMOR MARKERS: No results for input(s): AFPTM, CEA, CA199, CHROMGRNA in the last 8760 hours.  Assessment and Plan:  1 year 59-month status post recurrent left renal cell carcinoma cryoablation and biopsy.  Biopsy confirmed clear cell renal cell carcinoma.  Surveillance imaging confirms stable ablation defect.  No signs of residual or recurrent disease.  No new renal abnormality or acute finding.  Stable bilateral nephrolithiasis.  Plan: Continue surveillance  imaging in 1 year in July 2022 at [facility].   Electronically Signed: Greggory Keen 10/07/2019, 10:54 AM   I spent a total of    25 Minutes in remote  clinical consultation, greater than 50% of which was counseling/coordinating care for this patient with left renal cell carcinoma.    Visit type: Audio only (telephone). Audio (no video) only due to patient's lack of internet/smartphone capability. Alternative for in-person consultation at [facility], [address], [place], [place]. This visit type was conducted due to national recommendations for restrictions regarding the COVID-19 Pandemic (e.g. social distancing).  This format is felt to be most appropriate for this patient at this time.  All issues noted in this document were discussed and addressed.

## 2019-10-11 ENCOUNTER — Encounter (HOSPITAL_COMMUNITY): Payer: Self-pay | Admitting: Internal Medicine

## 2020-02-16 IMAGING — MG DIGITAL SCREENING BILATERAL MAMMOGRAM WITH TOMO AND CAD
6 of 10 series · 6 of 30 positions shown · non-contrast
Comparison: Previous exam(s).

CLINICAL DATA: Screening.

EXAM:
DIGITAL SCREENING BILATERAL MAMMOGRAM WITH TOMO AND CAD

[L CC synth-2D]
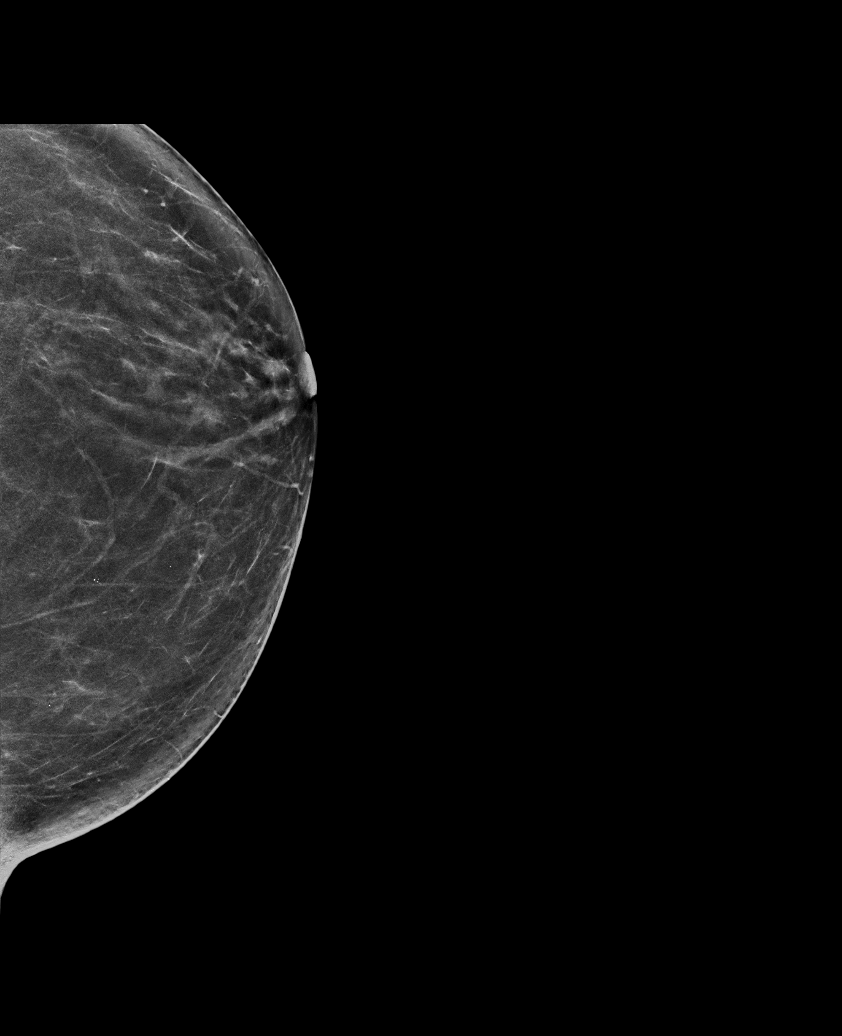

[R MLO synth-2D (1 of 2)]
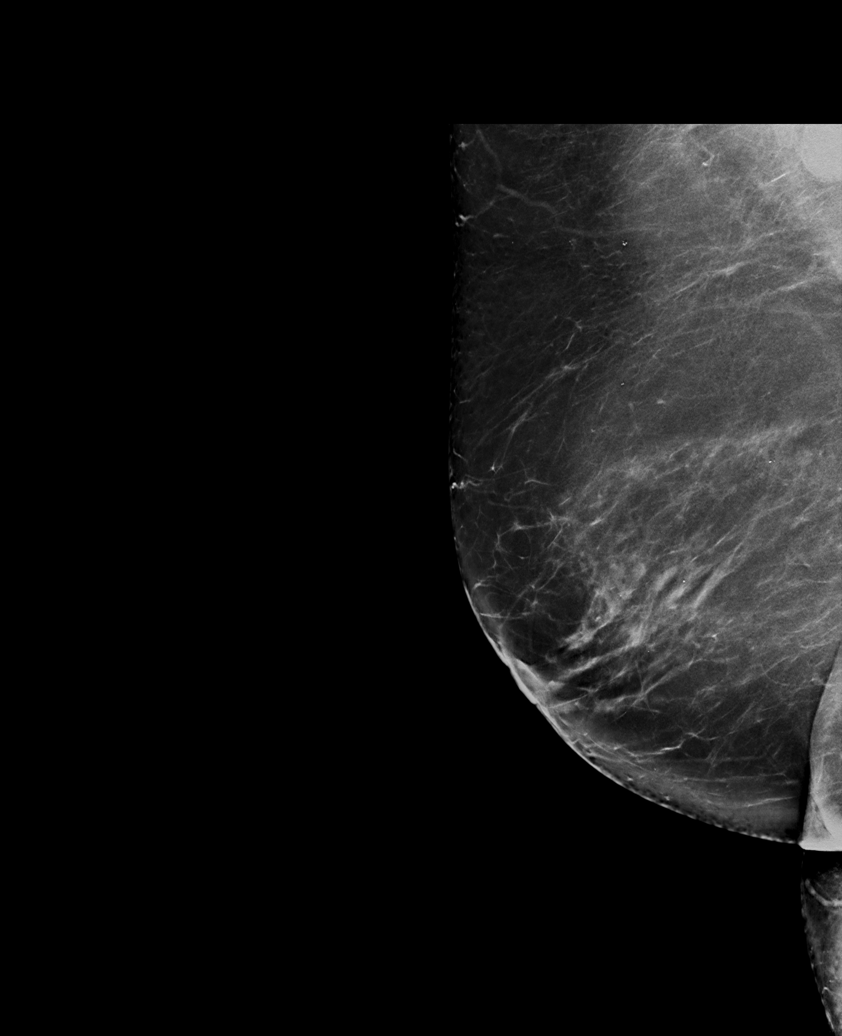

[R CC synth-2D]
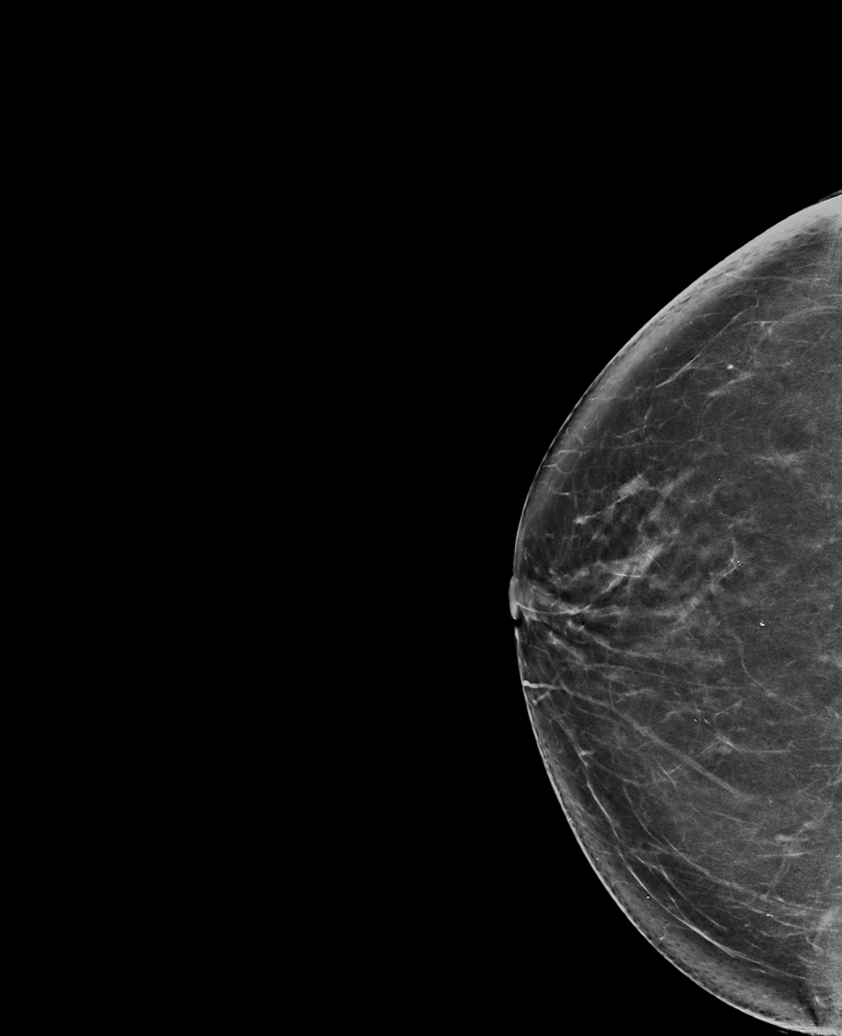

[R MLO synth-2D (2 of 2)]
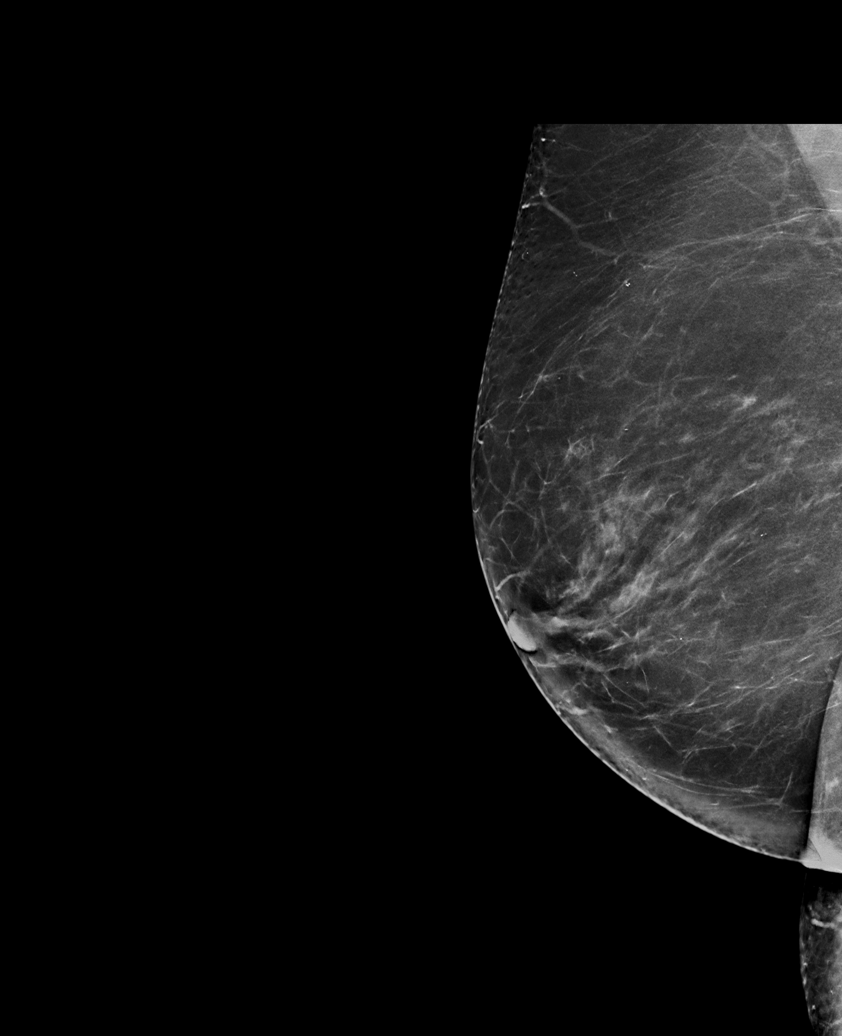

[L MLO synth-2D]
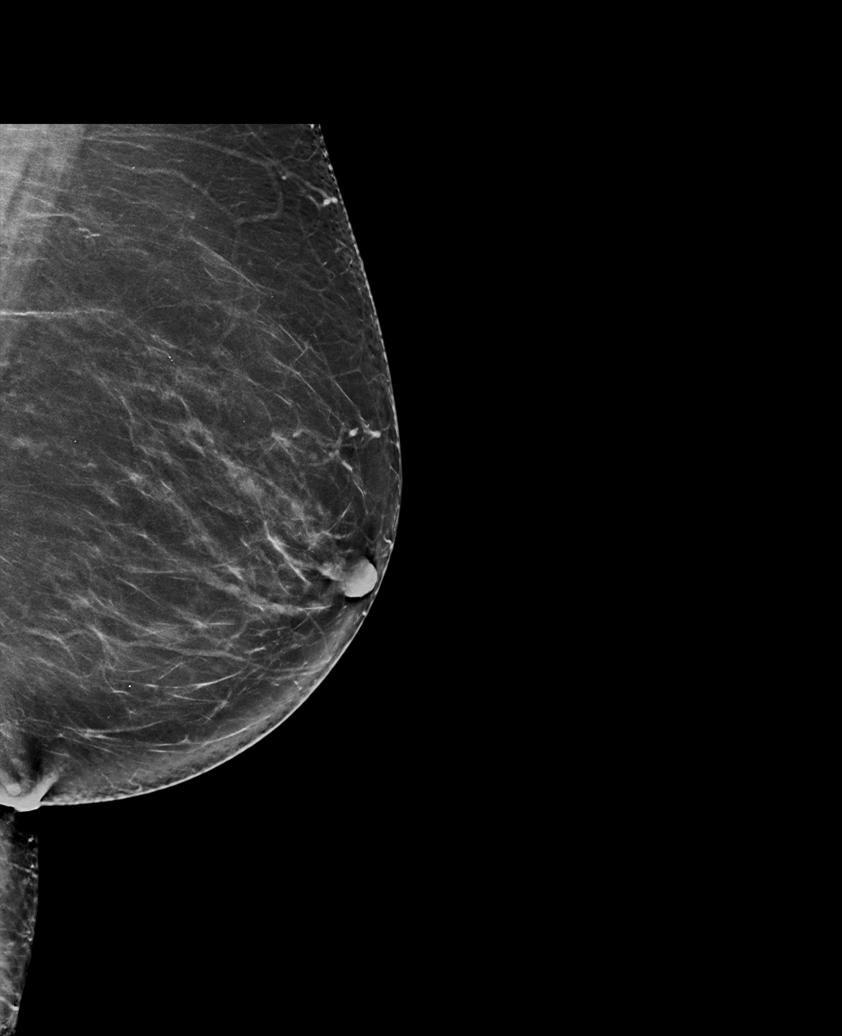

[R CC tomo · tomo slice 41/80.0]
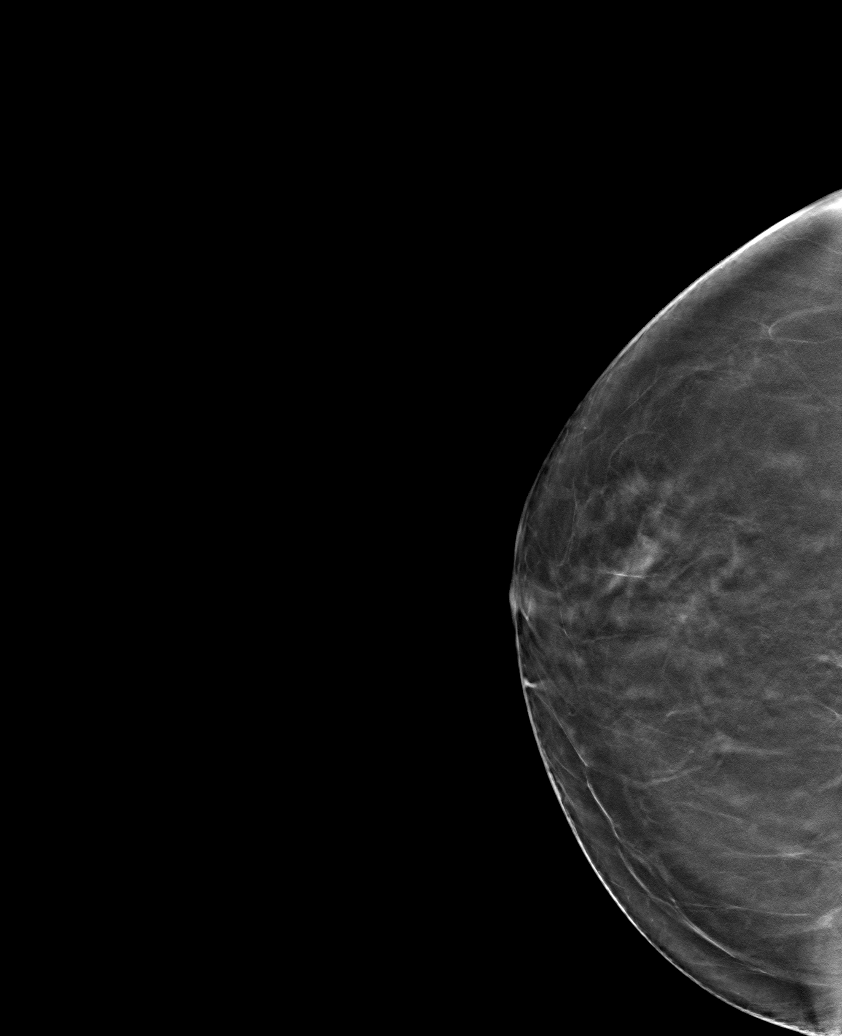

[6 of 30 positions shown; findings below may reference images not displayed]

ACR Breast Density Category b: There are scattered areas of
fibroglandular density.
FINDINGS: There are no findings suspicious for malignancy. Images were
processed with CAD.
IMPRESSION: No mammographic evidence of malignancy. A result letter of this
screening mammogram will be mailed directly to the patient.

RECOMMENDATION:
Screening mammogram in one year. (Code:[id number])

BI-RADS CATEGORY  1: Negative.

## 2020-03-30 ENCOUNTER — Ambulatory Visit (HOSPITAL_COMMUNITY)
Admission: RE | Admit: 2020-03-30 | Discharge: 2020-03-30 | Disposition: A | Discharge status: Home / Self Care | Payer: PPO | Source: Ambulatory Visit | Attending: Hematology | Admitting: Hematology

## 2020-03-30 ENCOUNTER — Other Ambulatory Visit: Payer: Self-pay

## 2020-03-30 ENCOUNTER — Inpatient Hospital Stay (HOSPITAL_COMMUNITY): Payer: PPO | Attending: Hematology

## 2020-03-30 ENCOUNTER — Other Ambulatory Visit (HOSPITAL_COMMUNITY): Payer: Self-pay | Admitting: Hematology

## 2020-03-30 DIAGNOSIS — Z85038 Personal history of other malignant neoplasm of large intestine: Secondary | ICD-10-CM | POA: Diagnosis not present

## 2020-03-30 DIAGNOSIS — I700 Atherosclerosis of aorta: Secondary | ICD-10-CM | POA: Diagnosis not present

## 2020-03-30 DIAGNOSIS — M129 Arthropathy, unspecified: Secondary | ICD-10-CM | POA: Diagnosis not present

## 2020-03-30 DIAGNOSIS — Z800 Family history of malignant neoplasm of digestive organs: Secondary | ICD-10-CM | POA: Insufficient documentation

## 2020-03-30 DIAGNOSIS — J479 Bronchiectasis, uncomplicated: Secondary | ICD-10-CM | POA: Diagnosis not present

## 2020-03-30 DIAGNOSIS — C642 Malignant neoplasm of left kidney, except renal pelvis: Secondary | ICD-10-CM | POA: Insufficient documentation

## 2020-03-30 DIAGNOSIS — R590 Localized enlarged lymph nodes: Secondary | ICD-10-CM | POA: Diagnosis not present

## 2020-03-30 DIAGNOSIS — R160 Hepatomegaly, not elsewhere classified: Secondary | ICD-10-CM | POA: Insufficient documentation

## 2020-03-30 DIAGNOSIS — M8580 Other specified disorders of bone density and structure, unspecified site: Secondary | ICD-10-CM

## 2020-03-30 DIAGNOSIS — Z79899 Other long term (current) drug therapy: Secondary | ICD-10-CM | POA: Diagnosis not present

## 2020-03-30 DIAGNOSIS — Z87442 Personal history of urinary calculi: Secondary | ICD-10-CM | POA: Insufficient documentation

## 2020-03-30 DIAGNOSIS — Z8572 Personal history of non-Hodgkin lymphomas: Secondary | ICD-10-CM | POA: Insufficient documentation

## 2020-03-30 DIAGNOSIS — Z85048 Personal history of other malignant neoplasm of rectum, rectosigmoid junction, and anus: Secondary | ICD-10-CM | POA: Diagnosis not present

## 2020-03-30 DIAGNOSIS — E538 Deficiency of other specified B group vitamins: Secondary | ICD-10-CM | POA: Diagnosis not present

## 2020-03-30 DIAGNOSIS — K8020 Calculus of gallbladder without cholecystitis without obstruction: Secondary | ICD-10-CM | POA: Diagnosis not present

## 2020-03-30 DIAGNOSIS — Z1509 Genetic susceptibility to other malignant neoplasm: Secondary | ICD-10-CM | POA: Insufficient documentation

## 2020-03-30 LAB — VITAMIN D 25 HYDROXY (VIT D DEFICIENCY, FRACTURES): Vit D, 25-Hydroxy: 48.12 ng/mL (ref 30–100)

## 2020-03-30 LAB — CBC WITH DIFFERENTIAL/PLATELET
Abs Immature Granulocytes: 0.04 10*3/uL (ref 0.00–0.07)
Basophils Absolute: 0.1 10*3/uL (ref 0.0–0.1)
Basophils Relative: 1 %
Eosinophils Absolute: 0.3 10*3/uL (ref 0.0–0.5)
Eosinophils Relative: 4 %
HCT: 40.1 % (ref 36.0–46.0)
Hemoglobin: 12.9 g/dL (ref 12.0–15.0)
Immature Granulocytes: 1 %
Lymphocytes Relative: 18 %
Lymphs Abs: 1.2 10*3/uL (ref 0.7–4.0)
MCH: 30.1 pg (ref 26.0–34.0)
MCHC: 32.2 g/dL (ref 30.0–36.0)
MCV: 93.7 fL (ref 80.0–100.0)
Monocytes Absolute: 0.7 10*3/uL (ref 0.1–1.0)
Monocytes Relative: 11 %
Neutro Abs: 4.4 10*3/uL (ref 1.7–7.7)
Neutrophils Relative %: 65 %
Platelets: 163 10*3/uL (ref 150–400)
RBC: 4.28 MIL/uL (ref 3.87–5.11)
RDW: 12.9 % (ref 11.5–15.5)
WBC: 6.8 10*3/uL (ref 4.0–10.5)
nRBC: 0 % (ref 0.0–0.2)

## 2020-03-30 LAB — COMPREHENSIVE METABOLIC PANEL
ALT: 16 U/L (ref 0–44)
AST: 16 U/L (ref 15–41)
Albumin: 3.4 g/dL — ABNORMAL LOW (ref 3.5–5.0)
Alkaline Phosphatase: 59 U/L (ref 38–126)
Anion gap: 7 (ref 5–15)
BUN: 18 mg/dL (ref 8–23)
CO2: 26 mmol/L (ref 22–32)
Calcium: 8.9 mg/dL (ref 8.9–10.3)
Chloride: 105 mmol/L (ref 98–111)
Creatinine, Ser: 0.83 mg/dL (ref 0.44–1.00)
GFR, Estimated: >60 mL/min (ref >60)
Glucose, Bld: 108 mg/dL — ABNORMAL HIGH (ref 70–99)
Potassium: 4.1 mmol/L (ref 3.5–5.1)
Sodium: 138 mmol/L (ref 135–145)
Total Bilirubin: 0.6 mg/dL (ref 0.3–1.2)
Total Protein: 6.6 g/dL (ref 6.5–8.1)

## 2020-03-30 LAB — VITAMIN B12: Vitamin B-12: 308 pg/mL (ref 180–914)

## 2020-03-30 MED ORDER — IOHEXOL 300 MG/ML  SOLN
75.0000 mL | Freq: Once | INTRAMUSCULAR | Status: AC | PRN
  Administered 2020-03-30: 75 mL via INTRAVENOUS

## 2020-03-31 LAB — CEA: CEA: 2.4 ng/mL (ref 0.0–4.7)

## 2020-04-04 ENCOUNTER — Telehealth: Payer: Self-pay | Admitting: Urology

## 2020-04-04 NOTE — Telephone Encounter (Signed)
Pt called and wanted to let you know that she had new scans done so you can look at them. Thanks!

## 2020-04-06 ENCOUNTER — Inpatient Hospital Stay (HOSPITAL_COMMUNITY): Payer: PPO | Admitting: Hematology

## 2020-04-06 ENCOUNTER — Other Ambulatory Visit: Payer: Self-pay

## 2020-04-06 VITALS — BP 142/76 | HR 80 | Resp 16 | Wt 212.4 lb

## 2020-04-06 DIAGNOSIS — C642 Malignant neoplasm of left kidney, except renal pelvis: Secondary | ICD-10-CM

## 2020-04-06 NOTE — Patient Instructions (Signed)
[facility] Discharge Instructions  You were seen today by Dr. Katragadda. He went over your recent results and scans. Dr. Katragadda will see you back in 6 months for labs and follow up.   Thank you for choosing [facility] at [facility] to provide your oncology and hematology care.  To afford each patient quality time with our provider, please arrive at least 15 minutes before your scheduled appointment time.   If you have a lab appointment with the Cancer Center please come in thru the Main Entrance and check in at the main information desk  You need to re-schedule your appointment should you arrive 10 or more minutes late.  We strive to give you quality time with our providers, and arriving late affects you and other patients whose appointments are after yours.  Also, if you no show three or more times for appointments you may be dismissed from the clinic at the providers discretion.     Again, thank you for choosing [facility].  Our hope is that these requests will decrease the amount of time that you wait before being seen by our physicians.       _____________________________________________________________  Should you have questions after your visit to [facility], please contact our office at [phone] between the hours of 8:00 a.m. and 4:30 p.m.  Voicemails left after 4:00 p.m. will not be returned until the following business day.  For prescription refill requests, have your pharmacy contact our office and allow 72 hours.    Cancer Center Support Programs:   > Cancer Support Group  2nd Tuesday of the month 1pm-2pm, Journey Room   

## 2020-04-06 NOTE — Progress Notes (Signed)
Edcouch Inkerman, Nicole [name]   CLINIC:  Medical Oncology/Hematology  PCP:  Lemmie Evens, MD [place]. / [place] [address] [phone]   REASON FOR VISIT:  Follow-up for left renal cell carcinoma  PRIOR THERAPY: Left partial nephrectomy on 04/17/2005  NGS Results: Not done  CURRENT THERAPY: Observation  BRIEF ONCOLOGIC HISTORY:  Oncology History   No history exists.    CANCER STAGING: Cancer Staging No matching staging information was found for the patient.  INTERVAL HISTORY:  Nicole [name], a 70 y.o. female, returns for routine follow-up of her left renal cell carcinoma. Nicole [name] was last seen on 09/28/2019.   Today she reports feeling well. She reports having 1 episode of hematuria upon waking up, but denies overall hematuria or hematochezia. She denies having recent infections, F/C, night sweats, weight loss, N/V or leg swelling. She is getting vitamin B12 injections every 3 weeks and takes vitamin D 5,000 units daily.   REVIEW OF SYSTEMS:  Review of Systems  Constitutional: Positive for fatigue (75%). Negative for appetite change, chills, diaphoresis, fever and unexpected weight change.  Respiratory: Positive for shortness of breath (w/ exertion).   Cardiovascular: Negative for leg swelling.  Gastrointestinal: Negative for blood in stool, nausea and vomiting.  Genitourinary: Positive for hematuria (x1 episode in AM).   Musculoskeletal: Positive for flank pain (4/10 L flank pain).  Neurological: Positive for dizziness.  All other systems reviewed and are negative.   PAST MEDICAL/SURGICAL HISTORY:  Past Medical History:  Diagnosis Date  . Arthritis    shorulder  . B12 deficiency   . Chronic diarrhea SECONDARY HX COLON CANCER  . History of colon cancer [facility] SYNDROME ---  COLORECTAL CANCER S/P COLECTOMY X3  LAST ONE 2007   NO RECURRENCE  . History of colon cancer 05/04/2013  . History of kidney infection  08/2013  . History of kidney stones   . History of MRSA infection 08/2013   left leg  . Hx of osteopenia   . Lynch syndrome 10/17/2010   Hereditary colon cancer  . NHL (non-Hodgkin's lymphoma) ([place]) 10/17/2010   MONITORED BY DR Tressie Stalker  . Personal history of renal cancer S/P LEFT PARTIAL NEPHRECTOMY 2007--  NO RECURRENCE   DUE TO KIDNEY CANCER  . Right shoulder pain S/P ROTATOR CUFF REPAIR   Past Surgical History:  Procedure Laterality Date  . ABDOMINAL HYSTERECTOMY    . APPENDECTOMY  1993   W/ LAVH  . COLONOSCOPY W/ POLYPECTOMY    . CYSTOSCOPY W/ RETROGRADES  07/14/2014   Procedure: CYSTOSCOPY WITH RETROGRADE PYELOGRAM;  Surgeon: Franchot Gallo, MD;  Location: [facility];  Service: Urology;;  . Consuela Mimes W/ URETERAL STENT REMOVAL Left 07/14/2014   Procedure: CYSTOSCOPY WITH STENT REMOVAL;  Surgeon: Franchot Gallo, MD;  Location: [facility];  Service: Urology;  Laterality: Left;  . CYSTOSCOPY WITH STENT PLACEMENT  02/07/2012   Procedure: CYSTOSCOPY WITH STENT PLACEMENT;  Surgeon: Franchot Gallo, MD;  Location: [facility];  Service: Urology;  Laterality: Left;  . CYSTOSCOPY WITH STENT PLACEMENT Left 07/14/2014   Procedure: CYSTOSCOPY WITH STENT PLACEMENT;  Surgeon: Franchot Gallo, MD;  Location: [facility];  Service: Urology;  Laterality: Left;  . FLEXIBLE SIGMOIDOSCOPY  08/29/2011   Procedure: FLEXIBLE SIGMOIDOSCOPY;  Surgeon: Rogene Houston, MD;  [facility];  Service: Endoscopy;  Laterality: N/A;  930  . FLEXIBLE SIGMOIDOSCOPY N/A 09/28/2012   Procedure: FLEXIBLE SIGMOIDOSCOPY;  Surgeon: Rogene Houston, MD;  [facility];  Service: Endoscopy;  Laterality: N/A;  730  . FLEXIBLE SIGMOIDOSCOPY N/A 07/08/2014   Procedure: FLEXIBLE SIGMOIDOSCOPY;  Surgeon: Rogene Houston, MD;  [facility];  Service: Endoscopy;  Laterality: N/A;  830 - moved to 10:20 - Ann to notify pt  . FLEXIBLE  SIGMOIDOSCOPY N/A 08/11/2015   Procedure: FLEXIBLE SIGMOIDOSCOPY;  Surgeon: Rogene Houston, MD;  [facility];  Service: Endoscopy;  Laterality: N/A;  730  . FLEXIBLE SIGMOIDOSCOPY N/A 08/13/2016   Procedure: FLEXIBLE SIGMOIDOSCOPY;  Surgeon: Rogene Houston, MD;  [facility];  Service: Endoscopy;  Laterality: N/A;  730  . FLEXIBLE SIGMOIDOSCOPY N/A 10/06/2019   Procedure: FLEXIBLE SIGMOIDOSCOPY;  Surgeon: Rogene Houston, MD;  [facility];  Service: Endoscopy;  Laterality: N/A;  1245  . HEMICOLECTOMY  2001   right  . HEMICOLECTOMY  2002   left  . HOLMIUM LASER APPLICATION Left 11/14/5619   Procedure: HOLMIUM LASER LITHOTRIPSY,;  Surgeon: Franchot Gallo, MD;  Location[facility];  Service: Urology;  Laterality: Left;  . IR RADIOLOGIST EVAL & MGMT  04/29/2018  . IR RADIOLOGIST EVAL & MGMT  10/07/2019  . kidney resection  2007   left partial  . LAPAROSCOPIC ASSISTED VAGINAL HYSTERECTOMY  1993   W/ BILATERAL SALPINGO-OOPHORECTOMY  . LEFT FLANK EXPLORATION W/  PARTIAL LEFT NEPHRECTOMY/ LEFT HILAR LYMPH NODE BX  04-17-2005  DR DAHLSTEDT   RENAL CELL CARCINOMA  . LITHOTRIPSY  11-22013   x2  . NEPHROLITHOTOMY Left 06/09/2014   Procedure: NEPHROLITHOTOMY PERCUTANEOUS;  Surgeon: Franchot Gallo, MD;  Location: [facility];  Service: Urology;  Laterality: Left;  with STENT  . RADIOLOGY WITH ANESTHESIA Left 05/20/2018   Procedure: CT WITH ANESTHESIA LEFT RENAL CRYOABLATION AND BIOPSY;  Surgeon: Greggory Keen, MD;  Location: [facility];  Service: Anesthesiology;  Laterality: Left;  . RIGHT ROTATOR  CUFF REPAIR/   BICEP REPAIR  10-17-2011  DR SUPPLE  . SUBTOTAL COLECTOMY  2007  . URETEROSCOPY WITH HOLMIUM LASER LITHOTRIPSY Left 07/14/2014   Procedure: URETEROSCOPY  EXTRACTION OF STONES;  Surgeon: Franchot Gallo, MD;  [facility];  Service: Urology;  Laterality: Left;    SOCIAL HISTORY:  Social History   Socioeconomic History   . Marital status: Divorced    Spouse name: Not on file  . Number of children: Not on file  . Years of education: Not on file  . Highest education level: Not on file  Occupational History  . Not on file  Tobacco Use  . Smoking status: Never Smoker  . Smokeless tobacco: Never Used  Vaping Use  . Vaping Use: Never used  Substance and Sexual Activity  . Alcohol use: Yes    Comment: 2 glasses of wine per year - maybe  . Drug use: No  . Sexual activity: Not on file  Other Topics Concern  . Not on file  Social History Narrative  . Not on file   Social Determinants of Health   Financial Resource Strain: Low Risk   . Difficulty of Paying Living Expenses: Not hard at all  Food Insecurity: No Food Insecurity  . Worried About Charity fundraiser in the Last Year: Never true  . Ran Out of Food in the Last Year: Never true  Transportation Needs: No Transportation Needs  . Lack of Transportation (Medical): No  . Lack of Transportation (Non-Medical): No  Physical Activity: Inactive  .  Days of Exercise per Week: 0 days  . Minutes of Exercise per Session: 0 min  Stress: No Stress Concern Present  . Feeling of Stress : Only a little  Social Connections: Moderately Isolated  . Frequency of Communication with Friends and Family: More than three times a week  . Frequency of Social Gatherings with Friends and Family: Twice a week  . Attends Religious Services: More than 4 times per year  . Active Member of Clubs or Organizations: No  . Attends Archivist Meetings: Never  . Marital Status: Divorced  Human resources officer Violence: Not At Risk  . Fear of Current or Ex-Partner: No  . Emotionally Abused: No  . Physically Abused: No  . Sexually Abused: No    FAMILY HISTORY:  Family History  Problem Relation Age of Onset  . Cancer Son     CURRENT MEDICATIONS:  Current Outpatient Medications  Medication Sig Dispense Refill  . Biotin 10000 MCG TABS Take 10,000 mcg by mouth daily.     . Cholecalciferol (VITAMIN D3) 125 MCG (5000 UT) CAPS Take 5,000 Units by mouth daily.    . cyanocobalamin (,VITAMIN B-12,) 1000 MCG/ML injection ADMINISTER 1 ML(1000 MCG) IN THE MUSCLE EVERY 21 DAYS (Patient taking differently: Inject 1,000 mcg into the muscle every 21 ( twenty-one) days.) 1 mL 17  . diphenoxylate-atropine (LOMOTIL) 2.5-0.025 MG tablet Take 1 tablet by mouth 4 (four) times daily as needed for diarrhea or loose stools.    . furosemide (LASIX) 40 MG tablet Take 40 mg by mouth daily as needed for fluid.     Marland Kitchen ibuprofen (ADVIL,MOTRIN) 800 MG tablet Take 800 mg by mouth 3 (three) times daily as needed for moderate pain.   11  . milk thistle 175 MG tablet Take 1,000 mg by mouth daily.    . Potassium Citrate 15 MEQ (1620 MG) TBCR Take 1 tablet by mouth 3 (three) times daily. 270 tablet 3   No current facility-administered medications for this visit.    ALLERGIES:  Allergies  Allergen Reactions  . Latex Rash  . Morphine And Related Other (See Comments)    hallucinations  . Tape Rash    Certain tapes     PHYSICAL EXAM:  Performance status (ECOG): 1 - Symptomatic but completely ambulatory  Vitals:   04/06/20 0818  BP: (!) 142/76  Pulse: 80  Resp: 16  SpO2: 100%   Wt Readings from Last 3 Encounters:  04/06/20 212 lb 6.4 oz (96.3 kg)  10/06/19 209 lb (94.8 kg)  09/28/19 211 lb 8 oz (95.9 kg)   Physical Exam Vitals reviewed.  Constitutional:      Appearance: Normal appearance. She is obese.  Cardiovascular:     Rate and Rhythm: Normal rate and regular rhythm.     Pulses: Normal pulses.     Heart sounds: Normal heart sounds.  Pulmonary:     Effort: Pulmonary effort is normal.     Breath sounds: Normal breath sounds.  Abdominal:     Palpations: Abdomen is soft. There is no hepatomegaly, splenomegaly or mass.     Tenderness: There is no abdominal tenderness.     Hernia: No hernia is present.  Musculoskeletal:     Right lower leg: No edema.     Left lower leg:  No edema.  Neurological:     General: No focal deficit present.     Mental Status: She is alert and oriented to person, place, and time.  Psychiatric:  Mood and Affect: Mood normal.        Behavior: Behavior normal.      LABORATORY DATA:  I have reviewed the labs as listed.  CBC Latest Ref Rng & Units 03/30/2020 09/16/2019 03/17/2019  WBC 4.0 - 10.5 K/uL 6.8 7.8 7.9  Hemoglobin 12.0 - 15.0 g/dL 12.9 13.0 12.9  Hematocrit 36.0 - 46.0 % 40.1 40.3 40.6  Platelets 150 - 400 K/uL 163 162 161   CMP Latest Ref Rng & Units 03/30/2020 09/16/2019 03/17/2019  Glucose 70 - 99 mg/dL 108(H) 104(H) 109(H)  BUN 8 - 23 mg/dL $Remove'18 20 19  'IjGFghv$ Creatinine 0.44 - 1.00 mg/dL 0.83 0.70 0.79  Sodium 135 - 145 mmol/L 138 138 138  Potassium 3.5 - 5.1 mmol/L 4.1 4.1 4.5  Chloride 98 - 111 mmol/L 105 103 103  CO2 22 - 32 mmol/L $RemoveB'26 25 23  'szamZGQQ$ Calcium 8.9 - 10.3 mg/dL 8.9 9.0 9.3  Total Protein 6.5 - 8.1 g/dL 6.6 6.5 6.8  Total Bilirubin 0.3 - 1.2 mg/dL 0.6 0.6 0.6  Alkaline Phos 38 - 126 U/L 59 65 60  AST 15 - 41 U/L $Remo'16 16 19  'fpWsB$ ALT 0 - 44 U/L $Remo'16 16 22   'piiKG$ Lab Results  Component Value Date   VD25OH 48.12 03/30/2020   Lab Results  Component Value Date   CEA1 2.4 03/30/2020   CEA1 2.4 09/16/2019   CEA1 2.8 03/17/2019    DIAGNOSTIC IMAGING:  I have independently reviewed the scans and discussed with the patient. CT ABDOMEN W WO CONTRAST  Result Date: 03/30/2020 CLINICAL DATA:  70 year old female with history of colon cancer and non-Hodgkin's lymphoma. Left-sided renal cell carcinoma. Surveillance scan. EXAM: CT ABDOMEN WITHOUT AND WITH CONTRAST TECHNIQUE: Multidetector CT imaging of the abdomen was performed following the standard protocol before and following the bolus administration of intravenous contrast. CONTRAST:  60mL OMNIPAQUE IOHEXOL 300 MG/ML  SOLN COMPARISON:  CT the abdomen 09/22/2019. FINDINGS: Lower chest: Areas of scarring are noted in the visualized lung bases. Mild cylindrical bronchiectasis in the  lower lobes of the lungs bilaterally. Hepatobiliary: Severe diffuse low attenuation throughout the hepatic parenchyma, indicative of a background of hepatic steatosis. No suspicious cystic or solid hepatic lesions. No intra or extrahepatic biliary ductal dilatation. Multiple calcified gallstones lying dependently in the gallbladder measuring up to 1.2 cm in diameter. Gallbladder is not dilated. No gallbladder wall thickening. No pericholecystic fluid or inflammatory changes. Pancreas: No pancreatic mass. No pancreatic ductal dilatation. No pancreatic or peripancreatic fluid collections or inflammatory changes. Spleen: Unremarkable. Adrenals/Urinary Tract: Right kidney and bilateral adrenal glands are normal in appearance. Postoperative changes of partial nephrectomy are again noted in the upper and interpolar region of the left kidney where there is a small amount of residual fat necrosis adjacent to the upper pole, similar to prior examinations. No new enhancing soft tissue mass in this region or adjacent soft tissues to suggest locally recurrent disease. Numerous calculi are noted within the left renal collecting system, largest of which is a staghorn calculus in the lower pole which measures up to 2 cm in diameter. No hydroureteronephrosis in the visualized portions of the abdomen. Stomach/Bowel: The appearance of the stomach is normal. Periampullary duodenal diverticulum, without surrounding inflammatory changes to suggest an associated diverticulitis at this time. No pathologic dilatation of visualized portions of small bowel. Status post colectomy. Vascular/Lymphatic: Aortic atherosclerosis, without evidence of aneurysm or dissection in the abdominal vasculature. Multiple borderline enlarged and mildly enlarged retroperitoneal lymph nodes appear similar compared  to prior examinations, with the largest of these in the left para-aortic nodal station (axial image 46 of series 6) measuring 1.9 cm in short axis.  Left retrocrural lymph node measuring 1.4 cm in short axis (previously 1.1 cm). Other: No significant volume of ascites and no pneumoperitoneum noted in the visualized portions of the peritoneal cavity. Musculoskeletal: There are no aggressive appearing lytic or blastic lesions noted in the visualized portions of the skeleton. IMPRESSION: 1. Stable postoperative findings of partial nephrectomy in the left kidney without evidence of local recurrence of disease. Previously noted retroperitoneal lymphadenopathy appears very similar to prior examinations. No other definite signs of metastatic disease noted in the abdomen. 2. Hepatic steatosis. 3. Cholelithiasis without evidence of acute cholecystitis at this time. 4. Areas of cylindrical bronchiectasis and scarring in the lower lobes of the lungs bilaterally. 5. Aortic atherosclerosis. Electronically Signed   By: Vinnie Langton M.D.   On: 03/30/2020 10:24     ASSESSMENT:  1. Left renal cell carcinoma: -Left partial nephrectomy on 04/17/2005, stage T1 a clear-cell renal cell carcinoma, Fuhrman nuclear grade 3/4, tumor size 1.9 cm. -MRI of the abdomen on 04/21/2018 showed left upper pole renal lesion suspicious for recurrent renal cell cancer near the partial nephrectomy site.  Stable retrocrural and retroperitoneal lymphadenopathy. -Left renal mass biopsy and cryoablation on 05/20/2018, biopsy consistent with clear-cell renal cell carcinoma. -CT abdomen with and without contrast on 09/22/2019 shows left partial nephrectomy with stable appearance of the cryoablation defect in the upper pole of the left kidney with no new or progressive findings.  Bilateral renal stones stable.  Stable retroperitoneal adenopathy.  Hepatomegaly with steatosis.  2. 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 -TAH and BSO in 1993. -Flex sigmoidoscopy on 08/13/2016 with normal ileum, end-to-end ileocolonic anastomosis, normal rectosigmoid colon, normal rectum.  3. Colon  cancer: -Subtotal colectomy with ileorectal anastomosis.  No change in bowel habits.  4.Follicular lymphoma: -Retroperitoneal adenopathy is stable.  No B symptoms.   PLAN:  1. Left renal cell carcinoma: -Reviewed CT abdomen from 03/30/2020 which showed stable findings of the left kidney. - We will repeat scan in 1 year.  2. Lynch syndrome: -She had flexible sigmoidoscopy on 10/06/2019 which was grossly normal.  3. Colon cancer: -Her CEA on 03/30/2020 was 2.4.  CT scan did not show any evidence of metastatic disease. - Flexible sigmoidoscopy for the remaining part of her colon was normal.  4.Follicular lymphoma: -No B symptoms. - Retroperitoneal lymphadenopathy stable on scan from 03/30/2020. - RTC 6 months with labs.  5. B12 deficiency: -Continue B12 injection every 3 weeks. - B12 level was normal.  6. Health maintenance: -Last bone density on 08/23/2019 shows T score of -1.5 consistent with osteopenia. - Continue vitamin D3.   Orders placed this encounter:  No orders of the defined types were placed in this encounter.    Derek Jack, MD [place] [phone]   I, Milinda Antis, am acting as a scribe for Dr. Sanda Linger.  I, Derek Jack MD, have reviewed the above documentation for accuracy and completeness, and I agree with the above.

## 2020-04-11 NOTE — Telephone Encounter (Signed)
Pt notified of results

## 2020-04-11 NOTE — Telephone Encounter (Signed)
Let patient know that I have looked at her images.  The stone is about the same size as last year.

## 2020-07-12 ENCOUNTER — Other Ambulatory Visit (HOSPITAL_COMMUNITY): Payer: Self-pay | Admitting: Hematology

## 2020-07-12 DIAGNOSIS — E538 Deficiency of other specified B group vitamins: Secondary | ICD-10-CM

## 2020-07-25 ENCOUNTER — Other Ambulatory Visit (HOSPITAL_COMMUNITY): Payer: Self-pay | Admitting: Family Medicine

## 2020-07-25 DIAGNOSIS — Z1231 Encounter for screening mammogram for malignant neoplasm of breast: Secondary | ICD-10-CM

## 2020-08-07 ENCOUNTER — Encounter (HOSPITAL_COMMUNITY)
Admission: RE | Admit: 2020-08-07 | Discharge: 2020-08-07 | Disposition: A | Discharge status: Home / Self Care | Payer: PPO | Source: Ambulatory Visit | Attending: Family Medicine | Admitting: Family Medicine

## 2020-08-07 DIAGNOSIS — Z1231 Encounter for screening mammogram for malignant neoplasm of breast: Secondary | ICD-10-CM | POA: Insufficient documentation

## 2020-08-07 DIAGNOSIS — C189 Malignant neoplasm of colon, unspecified: Secondary | ICD-10-CM | POA: Diagnosis not present

## 2020-08-07 DIAGNOSIS — C642 Malignant neoplasm of left kidney, except renal pelvis: Secondary | ICD-10-CM | POA: Diagnosis not present

## 2020-08-07 DIAGNOSIS — E782 Mixed hyperlipidemia: Secondary | ICD-10-CM | POA: Diagnosis not present

## 2020-08-09 DIAGNOSIS — D559 Anemia due to enzyme disorder, unspecified: Secondary | ICD-10-CM | POA: Diagnosis not present

## 2020-08-09 DIAGNOSIS — E559 Vitamin D deficiency, unspecified: Secondary | ICD-10-CM | POA: Diagnosis not present

## 2020-08-09 DIAGNOSIS — E039 Hypothyroidism, unspecified: Secondary | ICD-10-CM | POA: Diagnosis not present

## 2020-08-09 DIAGNOSIS — E7800 Pure hypercholesterolemia, unspecified: Secondary | ICD-10-CM | POA: Diagnosis not present

## 2020-08-29 ENCOUNTER — Ambulatory Visit: Payer: PPO | Admitting: Urology

## 2020-09-05 DIAGNOSIS — E6609 Other obesity due to excess calories: Secondary | ICD-10-CM | POA: Diagnosis not present

## 2020-09-06 ENCOUNTER — Other Ambulatory Visit (HOSPITAL_COMMUNITY): Payer: Self-pay | Admitting: Nurse Practitioner

## 2020-09-06 ENCOUNTER — Other Ambulatory Visit: Payer: Self-pay | Admitting: Nurse Practitioner

## 2020-09-06 DIAGNOSIS — I6521 Occlusion and stenosis of right carotid artery: Secondary | ICD-10-CM

## 2020-09-12 ENCOUNTER — Encounter: Payer: Self-pay | Admitting: Urology

## 2020-09-12 ENCOUNTER — Other Ambulatory Visit: Payer: Self-pay

## 2020-09-12 ENCOUNTER — Telehealth: Payer: PPO | Admitting: Urology

## 2020-09-12 DIAGNOSIS — N200 Calculus of kidney: Secondary | ICD-10-CM | POA: Diagnosis not present

## 2020-09-12 DIAGNOSIS — N390 Urinary tract infection, site not specified: Secondary | ICD-10-CM

## 2020-09-12 DIAGNOSIS — Z85528 Personal history of other malignant neoplasm of kidney: Secondary | ICD-10-CM | POA: Diagnosis not present

## 2020-09-12 NOTE — Progress Notes (Addendum)
History of Present Illness: Nicole [name] is a 70 y.o. year old female meeting with me today by way of telehealth for follow-up of renal cell carcinoma and recurrent urolithiasis of the left kidney.  She underwent left PCNL March, 2016 followed by left-sided ureteroscopy with laser and extraction of residual left renal calculi in April 16. 24-hour urine showed low magnesium and citrate level. Stone analysis revealed carbonate apatite.  She is on potassium citrate 15 mEq tid--most of the time she just takes it twice a day, however.    She is status post left partial nephrectomy 1.31.2007 for renal cell carcinoma. Final pathology revealed stage T1a clear cell carcinoma, Fuhrman nuclear grade 3/4. Tumor size was 1.9 cm.   CT A/P 1/18 revealed no evidence of recurrence.   1.6.2020: CT scan revealed enhancing 19 mm left upper pole renal mass consistent with recurrent renal cell carcinoma. There is stable retroperitoneal/peri aortic adenopathy. This was treated w/ cryoablation on 3.4.2020.  1.13.2022: CT abdomen and pelvis revealed no evidence of recurrence of her renal cell carcinoma.  There is a continued left lower pole staghorn calculus measuring 20 mm maximum diameter.  There is stable retroperitoneal adenopathy.  6.28.2022: She is followed by Dr. Delton Coombes.  She saw him in January of this year and got a clean bill of health.  She does have Lynch syndrome.  She has had no flank pain, urinary tract infections or gross hematuria.  She did call me in January following her CT scan, images were reviewed backbend.  She is still on potassium citrate 2-3 times a day.  Past Medical History:  Diagnosis Date   Arthritis    shorulder   B12 deficiency    Chronic diarrhea SECONDARY HX COLON CANCER   History of colon cancer [facility] SYNDROME ---  COLORECTAL CANCER S/P COLECTOMY X3  LAST ONE 2007   NO RECURRENCE   History of colon cancer 05/04/2013   History of kidney infection 08/2013   History of kidney  stones    History of MRSA infection 08/2013   left leg   Hx of osteopenia    Lynch syndrome 10/17/2010   Hereditary colon cancer   NHL (non-Hodgkin's lymphoma) ([place]) 10/17/2010   MONITORED BY DR Tressie Stalker   Personal history of renal cancer S/P LEFT PARTIAL NEPHRECTOMY 2007--  NO RECURRENCE   DUE TO KIDNEY CANCER   Right shoulder pain S/P ROTATOR CUFF REPAIR    Past Surgical History:  Procedure Laterality Date   ABDOMINAL HYSTERECTOMY     APPENDECTOMY  1993   W/ LAVH   COLONOSCOPY W/ POLYPECTOMY     CYSTOSCOPY W/ RETROGRADES  07/14/2014   Procedure: CYSTOSCOPY WITH RETROGRADE PYELOGRAM;  Surgeon: Franchot Gallo, MD;  Location: [facility];  Service: Urology;;   CYSTOSCOPY W/ URETERAL STENT REMOVAL Left 07/14/2014   Procedure: CYSTOSCOPY WITH STENT REMOVAL;  Surgeon: Franchot Gallo, MD;  Location: [facility];  Service: Urology;  Laterality: Left;   CYSTOSCOPY WITH STENT PLACEMENT  02/07/2012   Procedure: CYSTOSCOPY WITH STENT PLACEMENT;  Surgeon: Franchot Gallo, MD;  Location: [facility];  Service: Urology;  Laterality: Left;   CYSTOSCOPY WITH STENT PLACEMENT Left 07/14/2014   Procedure: CYSTOSCOPY WITH STENT PLACEMENT;  Surgeon: Franchot Gallo, MD;  Location: [facility];  Service: Urology;  Laterality: Left;   FLEXIBLE SIGMOIDOSCOPY  08/29/2011   Procedure: FLEXIBLE SIGMOIDOSCOPY;  Surgeon: Rogene Houston, MD;  [facility];  Service: Endoscopy;  Laterality: N/A;  [place] 09/28/2012   Procedure: FLEXIBLE SIGMOIDOSCOPY;  Surgeon: Rogene Houston, MD;  [facility];  Service: Endoscopy;  Laterality: N/A;  [place] N/A 07/08/2014   Procedure: FLEXIBLE SIGMOIDOSCOPY;  Surgeon: Rogene Houston, MD;  [facility];  Service: Endoscopy;  Laterality: N/A;  830 - moved to 10:20 - Ann to notify pt   FLEXIBLE SIGMOIDOSCOPY N/A 08/11/2015   Procedure:  FLEXIBLE SIGMOIDOSCOPY;  Surgeon: Rogene Houston, MD;  [facility];  Service: Endoscopy;  Laterality: N/A;  [place] N/A 08/13/2016   Procedure: FLEXIBLE SIGMOIDOSCOPY;  Surgeon: Rogene Houston, MD;  [facility];  Service: Endoscopy;  Laterality: N/A;  [place] N/A 10/06/2019   Procedure: FLEXIBLE SIGMOIDOSCOPY;  Surgeon: Rogene Houston, MD;  [facility];  Service: Endoscopy;  Laterality: N/A;  1245   HEMICOLECTOMY  2001   right   HEMICOLECTOMY  2002   left   HOLMIUM LASER APPLICATION Left 2/83/6629   Procedure: HOLMIUM LASER LITHOTRIPSY,;  Surgeon: Franchot Gallo, MD;  Location: [facility];  Service: Urology;  Laterality: Left;   IR RADIOLOGIST EVAL & MGMT  04/29/2018   IR RADIOLOGIST EVAL & MGMT  10/07/2019   kidney resection  2007   left partial   LAPAROSCOPIC ASSISTED VAGINAL HYSTERECTOMY  1993   W/ BILATERAL SALPINGO-OOPHORECTOMY   LEFT FLANK EXPLORATION W/  PARTIAL LEFT NEPHRECTOMY/ LEFT HILAR LYMPH NODE BX  04-17-2005  DR Khallid Pasillas   RENAL CELL CARCINOMA   LITHOTRIPSY  11-22013   x2   NEPHROLITHOTOMY Left 06/09/2014   Procedure: NEPHROLITHOTOMY PERCUTANEOUS;  Surgeon: Franchot Gallo, MD;  [facility];  Service: Urology;  Laterality: Left;  with STENT   RADIOLOGY WITH ANESTHESIA Left 05/20/2018   Procedure: CT WITH ANESTHESIA LEFT RENAL CRYOABLATION AND BIOPSY;  Surgeon: Greggory Keen, MD;  Location[facility];  Service: Anesthesiology;  Laterality: Left;   RIGHT ROTATOR  CUFF REPAIR/   BICEP REPAIR  10-17-2011  DR SUPPLE   SUBTOTAL COLECTOMY  2007   URETEROSCOPY WITH HOLMIUM LASER LITHOTRIPSY Left 07/14/2014   Procedure: URETEROSCOPY  EXTRACTION OF STONES;  Surgeon: Franchot Gallo, MD;  Location: [facility];  Service: Urology;  Laterality: Left;    Home Medications:  (Not in a hospital admission)   Allergies:  Allergies  Allergen Reactions   Latex Rash    Morphine And Related Other (See Comments)    hallucinations   Tape Rash    Certain tapes     Family History  Problem Relation Age of Onset   Cancer Son     Social History:  reports that she has never smoked. She has never used smokeless tobacco. She reports current alcohol use. She reports that she does not use drugs.  ROS: A complete review of systems was performed.  All systems are negative except for pertinent findings as noted.   I have reviewed prior pt notes  I have reviewed notes from Dr. Delton Coombes  I have independently reviewed prior imaging CT images from January 2022 reviewed   Impression/Assessment:  1.  History of renal cell carcinoma, status post partial nephrectomy followed by cryoablation, no evidence of recurrence on imaging from earlier this year  2.  Left renal calculi, stable, asymptomatic.  Despite their size, we are following them.  She seems to be stable on potassiums citrate.  Plan:  1.  She will  continue her potassium citrate.  I would recommend 3 times a day.  Continue increased fluids  2.  She will call me after she has had her CT scan done in early 2023.  We will continue surveillance unless significant increase in size  3.  I will see her back in 1 year   Jorja Loa 09/12/2020, 11:47 AM  Lillette Boxer. Zelma Mazariego MD  Pt seen in context of Covid 19 pandemic  Pt location: Home My location: Home office Persons on call: Patient, myself  I connected with  ADARA KITTLE on 09/12/20 by telephone and verified that I am speaking with the correct person using two identifiers.   I discussed the limitations of evaluation and management by telemedicine. The patient expressed understanding and agreed to proceed.  Time spent on phone: 23 minutes.  Also 8 minutes spent reviewing chart/imaging/laboratories

## 2020-09-13 ENCOUNTER — Ambulatory Visit (HOSPITAL_COMMUNITY)
Admission: RE | Admit: 2020-09-13 | Discharge: 2020-09-13 | Disposition: A | Discharge status: Home / Self Care | Payer: PPO | Source: Ambulatory Visit | Attending: Nurse Practitioner | Admitting: Nurse Practitioner

## 2020-09-13 ENCOUNTER — Other Ambulatory Visit: Payer: Self-pay | Admitting: Interventional Radiology

## 2020-09-13 ENCOUNTER — Other Ambulatory Visit: Payer: Self-pay

## 2020-09-13 DIAGNOSIS — N2889 Other specified disorders of kidney and ureter: Secondary | ICD-10-CM

## 2020-09-13 DIAGNOSIS — I6521 Occlusion and stenosis of right carotid artery: Secondary | ICD-10-CM

## 2020-09-13 DIAGNOSIS — I6523 Occlusion and stenosis of bilateral carotid arteries: Secondary | ICD-10-CM | POA: Diagnosis not present

## 2020-10-03 ENCOUNTER — Inpatient Hospital Stay (HOSPITAL_COMMUNITY): Payer: PPO | Attending: Hematology

## 2020-10-03 ENCOUNTER — Other Ambulatory Visit: Payer: Self-pay

## 2020-10-03 DIAGNOSIS — Z79899 Other long term (current) drug therapy: Secondary | ICD-10-CM | POA: Insufficient documentation

## 2020-10-03 DIAGNOSIS — Z9049 Acquired absence of other specified parts of digestive tract: Secondary | ICD-10-CM | POA: Insufficient documentation

## 2020-10-03 DIAGNOSIS — Z888 Allergy status to other drugs, medicaments and biological substances status: Secondary | ICD-10-CM | POA: Diagnosis not present

## 2020-10-03 DIAGNOSIS — Z87442 Personal history of urinary calculi: Secondary | ICD-10-CM | POA: Diagnosis not present

## 2020-10-03 DIAGNOSIS — Z905 Acquired absence of kidney: Secondary | ICD-10-CM | POA: Insufficient documentation

## 2020-10-03 DIAGNOSIS — Z90722 Acquired absence of ovaries, bilateral: Secondary | ICD-10-CM | POA: Diagnosis not present

## 2020-10-03 DIAGNOSIS — C189 Malignant neoplasm of colon, unspecified: Secondary | ICD-10-CM | POA: Insufficient documentation

## 2020-10-03 DIAGNOSIS — C8293 Follicular lymphoma, unspecified, intra-abdominal lymph nodes: Secondary | ICD-10-CM | POA: Diagnosis not present

## 2020-10-03 DIAGNOSIS — E538 Deficiency of other specified B group vitamins: Secondary | ICD-10-CM | POA: Insufficient documentation

## 2020-10-03 DIAGNOSIS — R160 Hepatomegaly, not elsewhere classified: Secondary | ICD-10-CM | POA: Insufficient documentation

## 2020-10-03 DIAGNOSIS — Z85528 Personal history of other malignant neoplasm of kidney: Secondary | ICD-10-CM | POA: Insufficient documentation

## 2020-10-03 DIAGNOSIS — Z885 Allergy status to narcotic agent status: Secondary | ICD-10-CM | POA: Insufficient documentation

## 2020-10-03 DIAGNOSIS — M8580 Other specified disorders of bone density and structure, unspecified site: Secondary | ICD-10-CM | POA: Insufficient documentation

## 2020-10-03 DIAGNOSIS — Z8614 Personal history of Methicillin resistant Staphylococcus aureus infection: Secondary | ICD-10-CM | POA: Insufficient documentation

## 2020-10-03 DIAGNOSIS — C642 Malignant neoplasm of left kidney, except renal pelvis: Secondary | ICD-10-CM

## 2020-10-03 LAB — CBC WITH DIFFERENTIAL/PLATELET
Abs Immature Granulocytes: 0.02 10*3/uL (ref 0.00–0.07)
Basophils Absolute: 0.1 10*3/uL (ref 0.0–0.1)
Basophils Relative: 1 %
Eosinophils Absolute: 0.3 10*3/uL (ref 0.0–0.5)
Eosinophils Relative: 4 %
HCT: 42.7 % (ref 36.0–46.0)
Hemoglobin: 13.8 g/dL (ref 12.0–15.0)
Immature Granulocytes: 0 %
Lymphocytes Relative: 19 %
Lymphs Abs: 1.4 10*3/uL (ref 0.7–4.0)
MCH: 30.3 pg (ref 26.0–34.0)
MCHC: 32.3 g/dL (ref 30.0–36.0)
MCV: 93.8 fL (ref 80.0–100.0)
Monocytes Absolute: 0.7 10*3/uL (ref 0.1–1.0)
Monocytes Relative: 10 %
Neutro Abs: 4.7 10*3/uL (ref 1.7–7.7)
Neutrophils Relative %: 66 %
Platelets: 174 10*3/uL (ref 150–400)
RBC: 4.55 MIL/uL (ref 3.87–5.11)
RDW: 12.8 % (ref 11.5–15.5)
WBC: 7.2 10*3/uL (ref 4.0–10.5)
nRBC: 0 % (ref 0.0–0.2)

## 2020-10-03 LAB — LACTATE DEHYDROGENASE: LDH: 118 U/L (ref 98–192)

## 2020-10-03 LAB — COMPREHENSIVE METABOLIC PANEL
ALT: 21 U/L (ref 0–44)
AST: 19 U/L (ref 15–41)
Albumin: 4.1 g/dL (ref 3.5–5.0)
Alkaline Phosphatase: 63 U/L (ref 38–126)
Anion gap: 10 (ref 5–15)
BUN: 27 mg/dL — ABNORMAL HIGH (ref 8–23)
CO2: 26 mmol/L (ref 22–32)
Calcium: 9.4 mg/dL (ref 8.9–10.3)
Chloride: 101 mmol/L (ref 98–111)
Creatinine, Ser: 0.94 mg/dL (ref 0.44–1.00)
GFR, Estimated: >60 mL/min (ref >60)
Glucose, Bld: 113 mg/dL — ABNORMAL HIGH (ref 70–99)
Potassium: 4.5 mmol/L (ref 3.5–5.1)
Sodium: 137 mmol/L (ref 135–145)
Total Bilirubin: 0.6 mg/dL (ref 0.3–1.2)
Total Protein: 7 g/dL (ref 6.5–8.1)

## 2020-10-03 LAB — VITAMIN D 25 HYDROXY (VIT D DEFICIENCY, FRACTURES): Vit D, 25-Hydroxy: 69.52 ng/mL (ref 30–100)

## 2020-10-03 LAB — VITAMIN B12: Vitamin B-12: 292 pg/mL (ref 180–914)

## 2020-10-04 LAB — CEA: CEA: 2 ng/mL (ref 0.0–4.7)

## 2020-10-06 ENCOUNTER — Ambulatory Visit (HOSPITAL_COMMUNITY): Payer: PPO

## 2020-10-06 DIAGNOSIS — E6609 Other obesity due to excess calories: Secondary | ICD-10-CM | POA: Diagnosis not present

## 2020-10-09 ENCOUNTER — Ambulatory Visit (HOSPITAL_COMMUNITY)
Admission: RE | Admit: 2020-10-09 | Discharge: 2020-10-09 | Disposition: A | Discharge status: Home / Self Care | Payer: PPO | Source: Ambulatory Visit | Attending: Interventional Radiology | Admitting: Interventional Radiology

## 2020-10-09 ENCOUNTER — Other Ambulatory Visit: Payer: Self-pay

## 2020-10-09 DIAGNOSIS — N2889 Other specified disorders of kidney and ureter: Secondary | ICD-10-CM | POA: Diagnosis not present

## 2020-10-09 DIAGNOSIS — K8020 Calculus of gallbladder without cholecystitis without obstruction: Secondary | ICD-10-CM | POA: Diagnosis not present

## 2020-10-09 DIAGNOSIS — C642 Malignant neoplasm of left kidney, except renal pelvis: Secondary | ICD-10-CM | POA: Diagnosis not present

## 2020-10-09 DIAGNOSIS — M47816 Spondylosis without myelopathy or radiculopathy, lumbar region: Secondary | ICD-10-CM | POA: Diagnosis not present

## 2020-10-09 DIAGNOSIS — N200 Calculus of kidney: Secondary | ICD-10-CM | POA: Diagnosis not present

## 2020-10-09 MED ORDER — IOHEXOL 300 MG/ML  SOLN
100.0000 mL | Freq: Once | INTRAMUSCULAR | Status: AC | PRN
  Administered 2020-10-09: 100 mL via INTRAVENOUS

## 2020-10-10 ENCOUNTER — Other Ambulatory Visit (HOSPITAL_COMMUNITY): Payer: Self-pay

## 2020-10-10 ENCOUNTER — Inpatient Hospital Stay (HOSPITAL_BASED_OUTPATIENT_CLINIC_OR_DEPARTMENT_OTHER): Payer: PPO | Admitting: Hematology and Oncology

## 2020-10-10 DIAGNOSIS — E538 Deficiency of other specified B group vitamins: Secondary | ICD-10-CM | POA: Diagnosis not present

## 2020-10-10 DIAGNOSIS — C642 Malignant neoplasm of left kidney, except renal pelvis: Secondary | ICD-10-CM | POA: Diagnosis not present

## 2020-10-10 DIAGNOSIS — D649 Anemia, unspecified: Secondary | ICD-10-CM

## 2020-10-10 DIAGNOSIS — Z85528 Personal history of other malignant neoplasm of kidney: Secondary | ICD-10-CM | POA: Diagnosis not present

## 2020-10-10 NOTE — Progress Notes (Signed)
[address], [place] [address]   CLINIC:  Medical Oncology/Hematology  PCP:  Lemmie Evens, MD [address] / [place] [address] [phone]   REASON FOR VISIT:  Follow-up for left renal cell carcinoma  PRIOR THERAPY: Left partial nephrectomy on 04/17/2005  NGS Results: Not done  CURRENT THERAPY: Observation  BRIEF ONCOLOGIC HISTORY:  Oncology History   No history exists.    CANCER STAGING: Cancer Staging No matching staging information was found for the patient.  INTERVAL HISTORY:  [name], a 70 y.o. female, returns for routine follow-up of her left renal cell carcinoma. Nicole [name] was last seen on 04/06/2020.   On exam today Nicole [name] reports that she has been losing weight with the help of a diet pill that was prescribed to her by her PCPs office.  She notes that she is lost 18 pounds in the last 2 months intentionally.  She denies having any issues with blood in the urine.  She denies having any fatigue or back pain.  She continues on her every 3 week vitamin B12 injections.  She otherwise denies any fevers, chills, sweats, nausea, vomiting or diarrhea.  A full 10 point ROS is listed below.   REVIEW OF SYSTEMS:  Review of Systems  Constitutional:  Positive for fatigue (75%). Negative for appetite change, chills, diaphoresis, fever and unexpected weight change.  Respiratory:  Positive for shortness of breath (w/ exertion).   Cardiovascular:  Negative for leg swelling.  Gastrointestinal:  Negative for blood in stool, nausea and vomiting.  Genitourinary:  Positive for hematuria (x1 episode in AM).   Musculoskeletal:  Positive for flank pain (4/10 L flank pain).  Neurological:  Positive for dizziness.  All other systems reviewed and are negative.  PAST MEDICAL/SURGICAL HISTORY:  Past Medical History:  Diagnosis Date   Arthritis    shorulder   B12 deficiency    Chronic diarrhea SECONDARY HX COLON CANCER    History of colon cancer [facility] SYNDROME ---  COLORECTAL CANCER S/P COLECTOMY X3  LAST ONE 2007   NO RECURRENCE   History of colon cancer 05/04/2013   History of kidney infection 08/2013   History of kidney stones    History of MRSA infection 08/2013   left leg   Hx of osteopenia    Lynch syndrome 10/17/2010   Hereditary colon cancer   NHL (non-Hodgkin's lymphoma) ([place]) 10/17/2010   MONITORED BY DR Tressie Stalker   Personal history of renal cancer S/P LEFT PARTIAL NEPHRECTOMY 2007--  NO RECURRENCE   DUE TO KIDNEY CANCER   Right shoulder pain S/P ROTATOR CUFF REPAIR   Past Surgical History:  Procedure Laterality Date   ABDOMINAL HYSTERECTOMY     APPENDECTOMY  1993   W/ LAVH   COLONOSCOPY W/ POLYPECTOMY     CYSTOSCOPY W/ RETROGRADES  07/14/2014   Procedure: CYSTOSCOPY WITH RETROGRADE PYELOGRAM;  Surgeon: Franchot Gallo, MD;  Location: [facility];  Service: Urology;;   CYSTOSCOPY W/ URETERAL STENT REMOVAL Left 07/14/2014   Procedure: CYSTOSCOPY WITH STENT REMOVAL;  Surgeon: Franchot Gallo, MD;  [facility]  Service: Urology;  Laterality: Left;   CYSTOSCOPY WITH STENT PLACEMENT  02/07/2012   Procedure: CYSTOSCOPY WITH STENT PLACEMENT;  Surgeon: Franchot Gallo, MD;  Location: [facility];  Service: Urology;  Laterality: Left;   CYSTOSCOPY WITH STENT PLACEMENT Left 07/14/2014   Procedure: CYSTOSCOPY WITH STENT PLACEMENT;  Surgeon: Franchot Gallo, MD;  Location: [facility];  Service: Urology;  Laterality: Left;   FLEXIBLE SIGMOIDOSCOPY  08/29/2011   Procedure: FLEXIBLE SIGMOIDOSCOPY;  Surgeon: Malissa Hippo, MD;  [facility];  Service: Endoscopy;  Laterality: N/A;  930   FLEXIBLE SIGMOIDOSCOPY N/A 09/28/2012   Procedure: FLEXIBLE SIGMOIDOSCOPY;  Surgeon: Malissa Hippo, MD;  [facility];  Service: Endoscopy;  Laterality: N/A;  730   FLEXIBLE SIGMOIDOSCOPY N/A 07/08/2014   Procedure: FLEXIBLE  SIGMOIDOSCOPY;  Surgeon: Malissa Hippo, MD;  [facility];  Service: Endoscopy;  Laterality: N/A;  830 - moved to 10:20 - Ann to notify pt   FLEXIBLE SIGMOIDOSCOPY N/A 08/11/2015   Procedure: FLEXIBLE SIGMOIDOSCOPY;  Surgeon: Malissa Hippo, MD;  [facility];  Service: Endoscopy;  Laterality: N/A;  730   FLEXIBLE SIGMOIDOSCOPY N/A 08/13/2016   Procedure: FLEXIBLE SIGMOIDOSCOPY;  Surgeon: Malissa Hippo, MD;  [facility];  Service: Endoscopy;  Laterality: N/A;  730   FLEXIBLE SIGMOIDOSCOPY N/A 10/06/2019   Procedure: FLEXIBLE SIGMOIDOSCOPY;  Surgeon: Malissa Hippo, MD;  [facility];  Service: Endoscopy;  Laterality: N/A;  1245   HEMICOLECTOMY  2001   right   HEMICOLECTOMY  2002   left   HOLMIUM LASER APPLICATION Left 07/14/2014   Procedure: HOLMIUM LASER LITHOTRIPSY,;  Surgeon: Marcine Matar, MD;  Location: [facility];  Service: Urology;  Laterality: Left;   IR RADIOLOGIST EVAL & MGMT  04/29/2018   IR RADIOLOGIST EVAL & MGMT  10/07/2019   kidney resection  2007   left partial   LAPAROSCOPIC ASSISTED VAGINAL HYSTERECTOMY  1993   W/ BILATERAL SALPINGO-OOPHORECTOMY   LEFT FLANK EXPLORATION W/  PARTIAL LEFT NEPHRECTOMY/ LEFT HILAR LYMPH NODE BX  04-17-2005  DR DAHLSTEDT   RENAL CELL CARCINOMA   LITHOTRIPSY  11-22013   x2   NEPHROLITHOTOMY Left 06/09/2014   Procedure: NEPHROLITHOTOMY PERCUTANEOUS;  Surgeon: Marcine Matar, MD;  [facility];  Service: Urology;  Laterality: Left;  with STENT   RADIOLOGY WITH ANESTHESIA Left 05/20/2018   Procedure: CT WITH ANESTHESIA LEFT RENAL CRYOABLATION AND BIOPSY;  Surgeon: Berdine Dance, MD;  [facility];  Service: Anesthesiology;  Laterality: Left;   RIGHT ROTATOR  CUFF REPAIR/   BICEP REPAIR  10-17-2011  DR SUPPLE   SUBTOTAL COLECTOMY  2007   URETEROSCOPY WITH HOLMIUM LASER LITHOTRIPSY Left 07/14/2014   Procedure: URETEROSCOPY  EXTRACTION OF STONES;  Surgeon: Marcine Matar, MD;  Location: [facility];  Service: Urology;  Laterality: Left;    SOCIAL HISTORY:  Social History   Socioeconomic History   Marital status: Divorced    Spouse name: Not on file   Number of children: Not on file   Years of education: Not on file   Highest education level: Not on file  Occupational History   Not on file  Tobacco Use   Smoking status: Never   Smokeless tobacco: Never  Vaping Use   Vaping Use: Never used  Substance and Sexual Activity   Alcohol use: Yes    Comment: 2 glasses of wine per year - maybe   Drug use: No   Sexual activity: Not on file  Other Topics Concern   Not on file  Social History Narrative   Not on file   Social Determinants of Health   Financial Resource Strain: Low Risk    Difficulty of Paying Living Expenses: Not hard at all  Food Insecurity: No Food Insecurity  Worried About Charity fundraiser in the Last Year: Never true   [place] in the Last Year: Never true  Transportation Needs: No Transportation Needs   Lack of Transportation (Medical): No   Lack of Transportation (Non-Medical): No  Physical Activity: Inactive   Days of Exercise per Week: 0 days   Minutes of Exercise per Session: 0 min  Stress: No Stress Concern Present   Feeling of Stress : Only a little  Social Connections: Moderately Isolated   Frequency of Communication with Friends and Family: More than three times a week   Frequency of Social Gatherings with Friends and Family: Twice a week   Attends Religious Services: More than 4 times per year   Active Member of [employer] or Organizations: No   Attends Music therapist: Never   Marital Status: Divorced  Human resources officer Violence: Not At Risk   Fear of Current or Ex-Partner: No   Emotionally Abused: No   Physically Abused: No   Sexually Abused: No    FAMILY HISTORY:  Family History  Problem Relation Age of Onset   Cancer Son     CURRENT MEDICATIONS:  Current  Outpatient Medications  Medication Sig Dispense Refill   Biotin 10000 MCG TABS Take 10,000 mcg by mouth daily.     Cholecalciferol (VITAMIN D3) 125 MCG (5000 UT) CAPS Take 5,000 Units by mouth daily.     cyanocobalamin (,VITAMIN B-12,) 1000 MCG/ML injection ADMINISTER 1 ML(1000 MCG) IN THE MUSCLE EVERY 21 DAYS 1 mL 17   diphenoxylate-atropine (LOMOTIL) 2.5-0.025 MG tablet Take 1 tablet by mouth 4 (four) times daily as needed for diarrhea or loose stools.     furosemide (LASIX) 40 MG tablet Take 40 mg by mouth daily as needed for fluid.      ibuprofen (ADVIL,MOTRIN) 800 MG tablet Take 800 mg by mouth 3 (three) times daily as needed for moderate pain.   11   milk thistle 175 MG tablet Take 1,000 mg by mouth daily.     Potassium Citrate 15 MEQ (1620 MG) TBCR Take 1 tablet by mouth 3 (three) times daily. 270 tablet 3   No current facility-administered medications for this visit.    ALLERGIES:  Allergies  Allergen Reactions   Latex Rash   Morphine And Related Other (See Comments)    hallucinations   Tape Rash    Certain tapes     PHYSICAL EXAM:  Performance status (ECOG): 1 - Symptomatic but completely ambulatory  There were no vitals filed for this visit.  Wt Readings from Last 3 Encounters:  04/06/20 212 lb 6.4 oz (96.3 kg)  10/06/19 209 lb (94.8 kg)  09/28/19 211 lb 8 oz (95.9 kg)   Physical Exam Vitals reviewed.  Constitutional:      Appearance: Normal appearance. She is obese.  Cardiovascular:     Rate and Rhythm: Normal rate and regular rhythm.     Pulses: Normal pulses.     Heart sounds: Normal heart sounds.  Pulmonary:     Effort: Pulmonary effort is normal.     Breath sounds: Normal breath sounds.  Abdominal:     Palpations: Abdomen is soft. There is no hepatomegaly, splenomegaly or mass.     Tenderness: There is no abdominal tenderness.     Hernia: No hernia is present.  Musculoskeletal:     Right lower leg: No edema.     Left lower leg: No edema.   Neurological:     General: No  focal deficit present.     Mental Status: She is alert and oriented to person, place, and time.  Psychiatric:        Mood and Affect: Mood normal.        Behavior: Behavior normal.     LABORATORY DATA:  I have reviewed the labs as listed.  CBC Latest Ref Rng & Units 10/03/2020 03/30/2020 09/16/2019  WBC 4.0 - 10.5 K/uL 7.2 6.8 7.8  Hemoglobin 12.0 - 15.0 g/dL 13.8 12.9 13.0  Hematocrit 36.0 - 46.0 % 42.7 40.1 40.3  Platelets 150 - 400 K/uL 174 163 162   CMP Latest Ref Rng & Units 10/03/2020 03/30/2020 09/16/2019  Glucose 70 - 99 mg/dL 113(H) 108(H) 104(H)  BUN 8 - 23 mg/dL 27(H) 18 20  Creatinine 0.44 - 1.00 mg/dL 0.94 0.83 0.70  Sodium 135 - 145 mmol/L 137 138 138  Potassium 3.5 - 5.1 mmol/L 4.5 4.1 4.1  Chloride 98 - 111 mmol/L 101 105 103  CO2 22 - 32 mmol/L $RemoveB'26 26 25  'phRuZyZz$ Calcium 8.9 - 10.3 mg/dL 9.4 8.9 9.0  Total Protein 6.5 - 8.1 g/dL 7.0 6.6 6.5  Total Bilirubin 0.3 - 1.2 mg/dL 0.6 0.6 0.6  Alkaline Phos 38 - 126 U/L 63 59 65  AST 15 - 41 U/L $Remo'19 16 16  'kqvPd$ ALT 0 - 44 U/L $Remo'21 16 16   'tNhUh$ Lab Results  Component Value Date   VD25OH 69.52 10/03/2020   VD25OH 48.12 03/30/2020   Lab Results  Component Value Date   CEA1 2.0 10/03/2020   CEA1 2.4 03/30/2020   CEA1 2.4 09/16/2019    DIAGNOSTIC IMAGING:  I have independently reviewed the scans and discussed with the patient. US Carotid Bilateral  Result Date: 09/13/2020 CLINICAL DATA:  70 year old female with a history carotid artery stenosis EXAM: BILATERAL CAROTID DUPLEX ULTRASOUND TECHNIQUE: Pearline Cables scale imaging, color Doppler and duplex ultrasound were performed of bilateral carotid and vertebral arteries in the neck. COMPARISON:  None. FINDINGS: Criteria: Quantification of carotid stenosis is based on velocity parameters that correlate the residual internal carotid diameter with NASCET-based stenosis levels, using the diameter of the distal internal carotid lumen as the denominator for stenosis measurement.  The following velocity measurements were obtained: RIGHT ICA:  Systolic 70 cm/sec, Diastolic 21 cm/sec CCA:  77 cm/sec SYSTOLIC ICA/CCA RATIO:  0.9 ECA:  84 cm/sec LEFT ICA:  Systolic 64 cm/sec, Diastolic 20 cm/sec CCA:  82 cm/sec SYSTOLIC ICA/CCA RATIO:  0.8 ECA:  72 cm/sec Right Brachial SBP: Not acquired Left Brachial SBP: Not acquired RIGHT CAROTID ARTERY: No significant calcified disease of the right common carotid artery. Intermediate waveform maintained. Heterogeneous plaque without significant calcifications at the right carotid bifurcation. Low resistance waveform of the right ICA. No significant tortuosity. RIGHT VERTEBRAL ARTERY: Antegrade flow with low resistance waveform. LEFT CAROTID ARTERY: No significant calcified disease of the left common carotid artery. Intermediate waveform maintained. Heterogeneous plaque at the left carotid bifurcation without significant calcifications. Low resistance waveform of the left ICA. LEFT VERTEBRAL ARTERY:  Antegrade flow with low resistance waveform. IMPRESSION: Color duplex indicates minimal heterogeneous plaque, with no hemodynamically significant stenosis by duplex criteria in the extracranial cerebrovascular circulation. Signed, Dulcy Fanny. Dellia Nims, RPVI [facility] Electronically Signed   By: Corrie Mckusick D.O.   On: 09/13/2020 13:36      ASSESSMENT:  1.  Left renal cell carcinoma: -Left partial nephrectomy on 04/17/2005, stage T1 a clear-cell renal cell carcinoma, Fuhrman nuclear grade 3/4,  tumor size 1.9 cm. -MRI of the abdomen on 04/21/2018 showed left upper pole renal lesion suspicious for recurrent renal cell cancer near the partial nephrectomy site.  Stable retrocrural and retroperitoneal lymphadenopathy. -Left renal mass biopsy and cryoablation on 05/20/2018, biopsy consistent with clear-cell renal cell carcinoma. -CT abdomen with and without contrast on 09/22/2019 shows left partial nephrectomy  with stable appearance of the cryoablation defect in the upper pole of the left kidney with no new or progressive findings.  Bilateral renal stones stable.  Stable retroperitoneal adenopathy.  Hepatomegaly with steatosis.   2.  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 -TAH and BSO in 1993. -Flex sigmoidoscopy on 08/13/2016 with normal ileum, end-to-end ileocolonic anastomosis, normal rectosigmoid colon, normal rectum.   3.  Colon cancer: -Subtotal colectomy with ileorectal anastomosis.  No change in bowel habits.   4.  Follicular lymphoma: -Retroperitoneal adenopathy is stable.  No B symptoms.   PLAN:  1.  Left renal cell carcinoma: -Reviewed CT abdomen from 03/30/2020 which showed stable findings of the left kidney. Final report pending for CT scan on 10/09/2020. I did not see any evidence of overt recurrent disease on my personal review.  - We will repeat scan in 1 year.   2.  Lynch syndrome: -She had flexible sigmoidoscopy on 10/06/2019 which was grossly normal.   3.  Colon cancer: -Her CEA on 10/03/2020 was 2.0.  Last CT scan did not show any evidence of metastatic disease. - Flexible sigmoidoscopy for the remaining part of her colon was normal.   4.  Follicular lymphoma: -No B symptoms. - Retroperitoneal lymphadenopathy stable on scan from 03/30/2020. - RTC 6 months with labs.   5.  B12 deficiency: -Continue B12 injection every 3 weeks. - B12 level was 292.   6.  Health maintenance: -Last bone density on 08/23/2019 shows T score of -1.5 consistent with osteopenia. - Continue vitamin D3.   Orders placed this encounter:  No orders of the defined types were placed in this encounter.  Ledell Peoples, MD Department of Hematology/Oncology [place] [facility] Phone: [phone] Pager: [phone] Email: [email or web address]

## 2020-10-11 ENCOUNTER — Encounter: Payer: Self-pay | Admitting: *Deleted

## 2020-10-11 ENCOUNTER — Ambulatory Visit
Admission: RE | Admit: 2020-10-11 | Discharge: 2020-10-11 | Disposition: A | Discharge status: Home / Self Care | Payer: PPO | Source: Ambulatory Visit | Attending: Interventional Radiology | Admitting: Interventional Radiology

## 2020-10-11 DIAGNOSIS — N2889 Other specified disorders of kidney and ureter: Secondary | ICD-10-CM

## 2020-10-11 DIAGNOSIS — Z9889 Other specified postprocedural states: Secondary | ICD-10-CM | POA: Diagnosis not present

## 2020-10-11 DIAGNOSIS — N200 Calculus of kidney: Secondary | ICD-10-CM | POA: Diagnosis not present

## 2020-10-11 HISTORY — PX: IR RADIOLOGIST EVAL & MGMT: IMG5224

## 2020-10-11 NOTE — Progress Notes (Signed)
Patient ID: Nicole [name], female   DOB: [birth year], 70 y.o.   MRN: [id number]       Chief Complaint:  Left renal cell carcinoma  Referring Physician(s): Dr. Lorenso Courier  History of Present Illness: Nicole [name] is a 70 y.o. female with a remote history of colon cancer, non-Hodgkin's lymphoma and left renal cell carcinoma.  She had a previous partial left nephrectomy in 2007.  Surveillance imaging demonstrated a left upper pole solid enhancing renal mass consistent with recurrence along the nephrectomy site.  She underwent successful image guided biopsy and cryoablation 05/20/2018 at [facility].  She continues to do very well.  No flank or abdominal pain.  No other urinary tract symptoms, fever or illness.  Surveillance imaging from [facility] 10/09/2020 reconfirms a stable ablation defect without any signs of residual or recurrent disease.  No new renal abnormality.  Similar burden of nephrolithiasis.  Gallstones again noted.  Past Medical History:  Diagnosis Date   Arthritis    shorulder   B12 deficiency    Chronic diarrhea SECONDARY HX COLON CANCER   History of colon cancer [facility] SYNDROME ---  COLORECTAL CANCER S/P COLECTOMY X3  LAST ONE 2007   NO RECURRENCE   History of colon cancer 05/04/2013   History of kidney infection 08/2013   History of kidney stones    History of MRSA infection 08/2013   left leg   Hx of osteopenia    Lynch syndrome 10/17/2010   Hereditary colon cancer   NHL (non-Hodgkin's lymphoma) ([place]) 10/17/2010   MONITORED BY DR Tressie Stalker   Personal history of renal cancer S/P LEFT PARTIAL NEPHRECTOMY 2007--  NO RECURRENCE   DUE TO KIDNEY CANCER   Right shoulder pain S/P ROTATOR CUFF REPAIR    Past Surgical History:  Procedure Laterality Date   ABDOMINAL HYSTERECTOMY     APPENDECTOMY  1993   W/ LAVH   COLONOSCOPY W/ POLYPECTOMY     CYSTOSCOPY W/ RETROGRADES  07/14/2014   Procedure: CYSTOSCOPY WITH RETROGRADE PYELOGRAM;  Surgeon: Franchot Gallo, MD;   Location: [facility];  Service: Urology;;   CYSTOSCOPY W/ URETERAL STENT REMOVAL Left 07/14/2014   Procedure: CYSTOSCOPY WITH STENT REMOVAL;  Surgeon: Franchot Gallo, MD;  Location: [facility];  Service: Urology;  Laterality: Left;   CYSTOSCOPY WITH STENT PLACEMENT  02/07/2012   Procedure: CYSTOSCOPY WITH STENT PLACEMENT;  Surgeon: Franchot Gallo, MD;  Location: [facility];  Service: Urology;  Laterality: Left;   CYSTOSCOPY WITH STENT PLACEMENT Left 07/14/2014   Procedure: CYSTOSCOPY WITH STENT PLACEMENT;  Surgeon: Franchot Gallo, MD;  Location: [facility];  Service: Urology;  Laterality: Left;   FLEXIBLE SIGMOIDOSCOPY  08/29/2011   Procedure: FLEXIBLE SIGMOIDOSCOPY;  Surgeon: Rogene Houston, MD;  [facility];  Service: Endoscopy;  Laterality: N/A;  [place] N/A 09/28/2012   Procedure: FLEXIBLE SIGMOIDOSCOPY;  Surgeon: Rogene Houston, MD;  [facility];  Service: Endoscopy;  Laterality: N/A;  [place] N/A 07/08/2014   Procedure: FLEXIBLE SIGMOIDOSCOPY;  Surgeon: Rogene Houston, MD;  [facility];  Service: Endoscopy;  Laterality: N/A;  830 - moved to 10:20 - Ann to notify pt   FLEXIBLE SIGMOIDOSCOPY N/A 08/11/2015   Procedure: FLEXIBLE SIGMOIDOSCOPY;  Surgeon: Rogene Houston, MD;  [facility];  Service: Endoscopy;  Laterality: N/A;  [place] N/A 08/13/2016   Procedure: FLEXIBLE SIGMOIDOSCOPY;  Surgeon: Rogene Houston, MD;  [facility];  Service: Endoscopy;  Laterality: N/A;  [place] N/A 10/06/2019   Procedure: FLEXIBLE SIGMOIDOSCOPY;  Surgeon: Rogene Houston, MD;  [facility];  Service: Endoscopy;  Laterality: N/A;  1245   HEMICOLECTOMY  2001   right   HEMICOLECTOMY  2002   left   HOLMIUM LASER APPLICATION Left 0000000   Procedure: HOLMIUM LASER LITHOTRIPSY,;  Surgeon:  Franchot Gallo, MD;  Location: [facility];  Service: Urology;  Laterality: Left;   IR RADIOLOGIST EVAL & MGMT  04/29/2018   IR RADIOLOGIST EVAL & MGMT  10/07/2019   kidney resection  2007   left partial   LAPAROSCOPIC ASSISTED VAGINAL HYSTERECTOMY  1993   W/ BILATERAL SALPINGO-OOPHORECTOMY   LEFT FLANK EXPLORATION W/  PARTIAL LEFT NEPHRECTOMY/ LEFT HILAR LYMPH NODE BX  04-17-2005  DR DAHLSTEDT   RENAL CELL CARCINOMA   LITHOTRIPSY  11-22013   x2   NEPHROLITHOTOMY Left 06/09/2014   Procedure: NEPHROLITHOTOMY PERCUTANEOUS;  Surgeon: Franchot Gallo, MD;  Location: [facility];  Service: Urology;  Laterality: Left;  with STENT   RADIOLOGY WITH ANESTHESIA Left 05/20/2018   Procedure: CT WITH ANESTHESIA LEFT RENAL CRYOABLATION AND BIOPSY;  Surgeon: Greggory Keen, MD;  Location: [facility];  Service: Anesthesiology;  Laterality: Left;   RIGHT ROTATOR  CUFF REPAIR/   BICEP REPAIR  10-17-2011  DR SUPPLE   SUBTOTAL COLECTOMY  2007   URETEROSCOPY WITH HOLMIUM LASER LITHOTRIPSY Left 07/14/2014   Procedure: URETEROSCOPY  EXTRACTION OF STONES;  Surgeon: Franchot Gallo, MD;  Location: [facility];  Service: Urology;  Laterality: Left;    Allergies: Latex, Morphine and related, and Tape  Medications: Prior to Admission medications   Medication Sig Start Date End Date Taking? Authorizing Provider  Cholecalciferol (VITAMIN D3) 125 MCG (5000 UT) CAPS Take 5,000 Units by mouth daily.    [provider]  cyanocobalamin (,VITAMIN B-12,) 1000 MCG/ML injection ADMINISTER 1 ML(1000 MCG) IN THE MUSCLE EVERY 21 DAYS 07/12/20   Derek Jack, MD  diphenoxylate-atropine (LOMOTIL) 2.5-0.025 MG tablet Take 1 tablet by mouth 4 (four) times daily as needed for diarrhea or loose stools. Patient not taking: Reported on 10/10/2020    [provider]  furosemide (LASIX) 40 MG tablet Take 40 mg by mouth daily as needed for fluid.     [provider]  ibuprofen  (ADVIL,MOTRIN) 800 MG tablet Take 800 mg by mouth 3 (three) times daily as needed for moderate pain.  Patient not taking: Reported on 10/10/2020 08/01/16   [provider]  milk thistle 175 MG tablet Take 1,000 mg by mouth daily.    [provider]  phentermine 37.5 MG capsule Take 37.5 mg by mouth daily. 09/09/20   [provider]  Potassium Citrate 15 MEQ (1620 MG) TBCR Take 1 tablet by mouth 3 (three) times daily. 07/09/19   Franchot Gallo, MD     Family History  Problem Relation Age of Onset   Cancer Son     Social History   Socioeconomic History   Marital status: Divorced    Spouse name: Not on file   Number of children: Not on file   Years of education: Not on file   Highest education level: Not on file  Occupational History   Not on file  Tobacco Use   Smoking status: Never   Smokeless tobacco: Never  Vaping Use   Vaping Use: Never used  Substance and Sexual Activity   Alcohol use: Yes    Comment: 2 glasses of wine per year - maybe   Drug use: No   Sexual activity: Not on file  Other Topics Concern   Not on file  Social History Narrative   Not on file   Social Determinants of Health   Financial Resource Strain: Low Risk    Difficulty of Paying Living Expenses: Not hard at all  Food Insecurity: No Food Insecurity   Worried About Charity fundraiser in the Last Year: Never true   [place] in the Last Year: Never true  Transportation Needs: No Transportation Needs   Lack of Transportation (Medical): No   Lack of Transportation (Non-Medical): No  Physical Activity: Inactive   Days of Exercise per Week: 0 days   Minutes of Exercise per Session: 0 min  Stress: No Stress Concern Present   Feeling of Stress : Only a little  Social Connections: Moderately Isolated   Frequency of Communication with Friends and Family: More than three times a week   Frequency of Social Gatherings with Friends and Family: Twice a week   Attends  Religious Services: More than 4 times per year   Active Member of [employer] or Organizations: No   Attends Archivist Meetings: Never   Marital Status: Divorced     Review of Systems  Review of Systems: A 12 point ROS discussed and pertinent positives are indicated in the HPI above.  All other systems are negative.  Physical Exam No direct physical exam was performed, telephone health visit only today Vital Signs: There were no vitals taken for this visit.  Imaging: US Carotid Bilateral  Result Date: 09/13/2020 CLINICAL DATA:  70 year old female with a history carotid artery stenosis EXAM: BILATERAL CAROTID DUPLEX ULTRASOUND TECHNIQUE: Pearline Cables scale imaging, color Doppler and duplex ultrasound were performed of bilateral carotid and vertebral arteries in the neck. COMPARISON:  None. FINDINGS: Criteria: Quantification of carotid stenosis is based on velocity parameters that correlate the residual internal carotid diameter with NASCET-based stenosis levels, using the diameter of the distal internal carotid lumen as the denominator for stenosis measurement. The following velocity measurements were obtained: RIGHT ICA:  Systolic 70 cm/sec, Diastolic 21 cm/sec CCA:  77 cm/sec SYSTOLIC ICA/CCA RATIO:  0.9 ECA:  84 cm/sec LEFT ICA:  Systolic 64 cm/sec, Diastolic 20 cm/sec CCA:  82 cm/sec SYSTOLIC ICA/CCA RATIO:  0.8 ECA:  72 cm/sec Right Brachial SBP: Not acquired Left Brachial SBP: Not acquired RIGHT CAROTID ARTERY: No significant calcified disease of the right common carotid artery. Intermediate waveform maintained. Heterogeneous plaque without significant calcifications at the right carotid bifurcation. Low resistance waveform of the right ICA. No significant tortuosity. RIGHT VERTEBRAL ARTERY: Antegrade flow with low resistance waveform. LEFT CAROTID ARTERY: No significant calcified disease of the left common carotid artery. Intermediate waveform maintained. Heterogeneous plaque at the left  carotid bifurcation without significant calcifications. Low resistance waveform of the left ICA. LEFT VERTEBRAL ARTERY:  Antegrade flow with low resistance waveform. IMPRESSION: Color duplex indicates minimal heterogeneous plaque, with no hemodynamically significant stenosis by duplex criteria in the extracranial cerebrovascular circulation. Signed, Dulcy Fanny. Dellia Nims, RPVI [facility] Electronically Signed   By: Corrie Mckusick D.O.   On: 09/13/2020 13:36   CT ABDOMEN W WO CONTRAST  Result Date: 10/10/2020 CLINICAL DATA:  Left renal cell carcinoma, prior cryoablation, prior partial nephrectomy. EXAM: CT ABDOMEN WITHOUT AND WITH CONTRAST TECHNIQUE: Multidetector  CT imaging of the abdomen was performed following the standard protocol before and following the bolus administration of intravenous contrast. CONTRAST:  179m OMNIPAQUE IOHEXOL 300 MG/ML  SOLN COMPARISON:  03/30/2020 FINDINGS: Lower chest: Stable scarring in both lower lobes. Lower thoracic periaortic lymph node 0.9 cm in short axis on image 7 series 2, stable. Hepatobiliary: Mild diffuse hepatic steatosis.  Cholelithiasis. Pancreas: Unremarkable Spleen: Unremarkable Adrenals/Urinary Tract: Both adrenal glands appear normal. No substantial change or abnormal enhancement along the left kidney upper pole cryoablation site. No abnormal findings along the left mid kidney posterolateral partial nephrectomy site to indicate locally recurrent malignancy. No worrisome right renal mass identified. Staghorn calculi in the left kidney are concentrated in the lower pole collecting system. Punctate nonobstructive 2 mm right mid kidney calculus. No hydronephrosis or hydroureter. Circumferential wall thickening in the left renal collecting system is stable and nonspecific. Stomach/Bowel: Periampullary duodenal diverticulum.: Not visualized in the upper abdomen, indicating at least partial colectomy.  Vascular/Lymphatic: Aortoiliac atherosclerotic vascular disease. Left retrocrural node 1.2 cm in short axis on image 26 series 11, previously the same. Left periaortic lymph node 1.3 cm in short axis on image 33 series 11, formerly 1.4 cm. Conglomerate left periaortic adenopathy 1.6 cm in short axis on image 44 series 11, previously the same. Retrocaval adenopathy 1.4 cm in short axis on image 48 series 11, stable. Other pathologically enlarged retroperitoneal lymph nodes are present. No tumor thrombus in either renal vein. Other: Stable hazy stranding centrally in the upper mesentery potentially from lymphoma or sclerosing mesenteritis. Musculoskeletal: Lumbar spondylosis and degenerative disc disease contributing to right foraminal impingement at L4-5 and bilateral foraminal impingement at L5-S1. IMPRESSION: 1. No findings of recurrent malignancy involving the left kidney. 2. Stable retroperitoneal and lower thoracic para-aortic adenopathy. Etiology uncertain but this could be from lymphoma. There is also some hazy stranding in the central mesentery which could be related to lymphoma or sclerosing mesenteritis. 3. Bilateral nonobstructive renal calculi, much more prominent on the left for there is staghorn calculus in the lower pole. 4. Continued circumferential thickening of the left renal collecting system, probably from chronic inflammation although not entirely specific. 5. Cholelithiasis. 6.  Aortic Atherosclerosis (ICD10-I70.0). 7. Diffuse hepatic steatosis. 8. Lower lumbar impingement. Electronically Signed   By: WVan ClinesM.D.   On: 10/10/2020 10:31    Labs:  CBC: Recent Labs    03/30/20 0809 10/03/20 0955  WBC 6.8 7.2  HGB 12.9 13.8  HCT 40.1 42.7  PLT 163 174    COAGS: No results for input(s): INR, APTT in the last 8760 hours.  BMP: Recent Labs    03/30/20 0809 10/03/20 0955  NA 138 137  K 4.1 4.5  CL 105 101  CO2 26 26  GLUCOSE 108* 113*  BUN 18 27*  CALCIUM 8.9 9.4   CREATININE 0.83 0.94  GFRNONAA >60 >60    LIVER FUNCTION TESTS: Recent Labs    03/30/20 0809 10/03/20 0955  BILITOT 0.6 0.6  AST 16 19  ALT 16 21  ALKPHOS 59 63  PROT 6.6 7.0  ALBUMIN 3.4* 4.1     Assessment and Plan:  Approximately 2.5 years status post recurrent left renal cell carcinoma cryoablation with biopsy.  Biopsy did confirm clear-cell renal cell carcinoma.  Surveillance imaging continues to demonstrate a stable ablation defect without any signs of residual or recurrent disease.  No new renal abnormality on either side.  Stable nephrolithiasis.  Plan: Continue surveillance imaging in 1 year every July at  [facility].  Total surveillance will be for 5 years.     Electronically Signed: Greggory Keen 10/11/2020, 9:25 AM   I spent a total of    25 Minutes in remote  clinical consultation, greater than 50% of which was counseling/coordinating care for this patient with renal cell carcinoma  Visit type: Audio only (telephone). Audio (no video) only due to patient's lack of internet/smartphone capability. Alternative for in-person consultation at [facility], [address], [place], [place]. This visit type was conducted due to national recommendations for restrictions regarding the COVID-19 Pandemic (e.g. social distancing).  This format is felt to be most appropriate for this patient at this time.  All issues noted in this document were discussed and addressed.

## 2020-10-17 ENCOUNTER — Telehealth: Payer: Self-pay

## 2020-10-17 NOTE — Telephone Encounter (Signed)
Patient had CT scans done. Was calling back to let Dr. Diona Fanti know.  Thanks, Helene Kelp

## 2020-10-31 ENCOUNTER — Other Ambulatory Visit: Payer: Self-pay | Admitting: Urology

## 2020-10-31 DIAGNOSIS — N200 Calculus of kidney: Secondary | ICD-10-CM

## 2020-11-02 NOTE — Telephone Encounter (Signed)
Patient returned your call. She would like to proceed with surgery.

## 2020-11-03 DIAGNOSIS — R030 Elevated blood-pressure reading, without diagnosis of hypertension: Secondary | ICD-10-CM | POA: Diagnosis not present

## 2020-11-03 DIAGNOSIS — E6609 Other obesity due to excess calories: Secondary | ICD-10-CM | POA: Diagnosis not present

## 2020-11-07 ENCOUNTER — Other Ambulatory Visit: Payer: Self-pay | Admitting: Urology

## 2020-11-07 DIAGNOSIS — N200 Calculus of kidney: Secondary | ICD-10-CM

## 2020-11-07 NOTE — Telephone Encounter (Signed)
Mychart message sent.

## 2020-11-08 ENCOUNTER — Other Ambulatory Visit: Payer: Self-pay | Admitting: Urology

## 2020-11-14 ENCOUNTER — Other Ambulatory Visit: Payer: Self-pay

## 2020-11-14 DIAGNOSIS — N200 Calculus of kidney: Secondary | ICD-10-CM

## 2020-11-14 MED ORDER — POTASSIUM CITRATE ER 15 MEQ (1620 MG) PO TBCR: 1.0000 | EXTENDED_RELEASE_TABLET | Freq: Three times a day (TID) | ORAL | 0 refills | Status: DC

## 2020-11-27 ENCOUNTER — Other Ambulatory Visit: Payer: Self-pay

## 2020-11-27 ENCOUNTER — Encounter (HOSPITAL_BASED_OUTPATIENT_CLINIC_OR_DEPARTMENT_OTHER): Payer: Self-pay | Admitting: Urology

## 2020-11-27 NOTE — Progress Notes (Signed)
Spoke w/ via phone for pre-op interview--- Nicole [name]----    ISTAT           Lab results------ COVID test -----patient states asymptomatic no test needed Arrive at -------0645 NPO after MN NO Solid Food.  Clear liquids from MN until--- Med rec completed Medications to take morning of surgery ----- none Diabetic medication ----- Patient instructed no nail polish to be worn day of surgery Patient instructed to bring photo id and insurance card day of surgery Patient aware to have Driver (ride ) / caregiver    for 24 hours after surgery  Patient Special Instructions ----- Pre-Op special Istructions ----- Patient verbalized understanding of instructions that were given at this phone interview. Patient denies shortness of breath, chest pain, fever, cough at this phone interview.

## 2020-11-29 NOTE — Anesthesia Preprocedure Evaluation (Addendum)
Anesthesia Evaluation  Patient identified by MRN, date of birth, ID band  Reviewed: Allergy & Precautions, NPO status , Patient's Chart, lab work & pertinent test results  Airway Mallampati: II  TM Distance: >3 FB Neck ROM: Full    Dental no notable dental hx. (+) Dental Advisory Given, Teeth Intact   Pulmonary neg pulmonary ROS,    Pulmonary exam normal breath sounds clear to auscultation       Cardiovascular negative cardio ROS Normal cardiovascular exam Rhythm:Regular Rate:Normal     Neuro/Psych negative neurological ROS  negative psych ROS   GI/Hepatic negative GI ROS, Neg liver ROS, HNPCC with CRC   Endo/Other  negative endocrine ROS  Renal/GU Renal diseaseRenal calculi      Musculoskeletal  (+) Arthritis , Osteoarthritis,    Abdominal (+) + obese,   Peds  Hematology  (+) Blood dyscrasia, ,   Anesthesia Other Findings   Reproductive/Obstetrics negative OB ROS                            Anesthesia Physical Anesthesia Plan  ASA: 3  Anesthesia Plan: General   Post-op Pain Management:    Induction: Intravenous  PONV Risk Score and Plan: 4 or greater and Ondansetron, Dexamethasone, Treatment may vary due to age or medical condition, Diphenhydramine and Midazolam  Airway Management Planned: LMA  Additional Equipment: None  Intra-op Plan:   Post-operative Plan: Extubation in OR  Informed Consent: I have reviewed the patients History and Physical, chart, labs and discussed the procedure including the risks, benefits and alternatives for the proposed anesthesia with the patient or authorized representative who has indicated his/her understanding and acceptance.     Dental advisory given  Plan Discussed with: CRNA  Anesthesia Plan Comments:        Anesthesia Quick Evaluation

## 2020-11-30 ENCOUNTER — Encounter (HOSPITAL_BASED_OUTPATIENT_CLINIC_OR_DEPARTMENT_OTHER): Payer: Self-pay | Admitting: Urology

## 2020-11-30 ENCOUNTER — Encounter (HOSPITAL_BASED_OUTPATIENT_CLINIC_OR_DEPARTMENT_OTHER)
Admission: RE | Disposition: A | Discharge status: Home / Self Care | Payer: Self-pay | Source: Home / Self Care | Attending: Urology

## 2020-11-30 ENCOUNTER — Ambulatory Visit (HOSPITAL_BASED_OUTPATIENT_CLINIC_OR_DEPARTMENT_OTHER): Payer: PPO | Admitting: Anesthesiology

## 2020-11-30 ENCOUNTER — Other Ambulatory Visit: Payer: Self-pay

## 2020-11-30 ENCOUNTER — Ambulatory Visit (HOSPITAL_BASED_OUTPATIENT_CLINIC_OR_DEPARTMENT_OTHER)
Admission: RE | Admit: 2020-11-30 | Discharge: 2020-11-30 | Disposition: A | Discharge status: Home / Self Care | Payer: PPO | Attending: Urology | Admitting: Urology

## 2020-11-30 DIAGNOSIS — Z91040 Latex allergy status: Secondary | ICD-10-CM | POA: Diagnosis not present

## 2020-11-30 DIAGNOSIS — Z85038 Personal history of other malignant neoplasm of large intestine: Secondary | ICD-10-CM | POA: Insufficient documentation

## 2020-11-30 DIAGNOSIS — Z85528 Personal history of other malignant neoplasm of kidney: Secondary | ICD-10-CM | POA: Insufficient documentation

## 2020-11-30 DIAGNOSIS — Z888 Allergy status to other drugs, medicaments and biological substances status: Secondary | ICD-10-CM | POA: Insufficient documentation

## 2020-11-30 DIAGNOSIS — N200 Calculus of kidney: Secondary | ICD-10-CM | POA: Diagnosis not present

## 2020-11-30 DIAGNOSIS — Z79899 Other long term (current) drug therapy: Secondary | ICD-10-CM | POA: Diagnosis not present

## 2020-11-30 DIAGNOSIS — Z8614 Personal history of Methicillin resistant Staphylococcus aureus infection: Secondary | ICD-10-CM | POA: Insufficient documentation

## 2020-11-30 DIAGNOSIS — Z9049 Acquired absence of other specified parts of digestive tract: Secondary | ICD-10-CM | POA: Diagnosis not present

## 2020-11-30 DIAGNOSIS — Z87442 Personal history of urinary calculi: Secondary | ICD-10-CM | POA: Diagnosis not present

## 2020-11-30 DIAGNOSIS — Z885 Allergy status to narcotic agent status: Secondary | ICD-10-CM | POA: Insufficient documentation

## 2020-11-30 DIAGNOSIS — Z905 Acquired absence of kidney: Secondary | ICD-10-CM | POA: Diagnosis not present

## 2020-11-30 DIAGNOSIS — E538 Deficiency of other specified B group vitamins: Secondary | ICD-10-CM | POA: Diagnosis not present

## 2020-11-30 DIAGNOSIS — Z1509 Genetic susceptibility to other malignant neoplasm: Secondary | ICD-10-CM | POA: Insufficient documentation

## 2020-11-30 HISTORY — PX: HOLMIUM LASER APPLICATION: SHX5852

## 2020-11-30 HISTORY — PX: CYSTOSCOPY WITH RETROGRADE PYELOGRAM, URETEROSCOPY AND STENT PLACEMENT: SHX5789

## 2020-11-30 LAB — POCT I-STAT, CHEM 8
BUN: 19 mg/dL (ref 8–23)
Calcium, Ion: 1.27 mmol/L (ref 1.15–1.40)
Chloride: 103 mmol/L (ref 98–111)
Creatinine, Ser: 0.7 mg/dL (ref 0.44–1.00)
Glucose, Bld: 105 mg/dL — ABNORMAL HIGH (ref 70–99)
HCT: 37 % (ref 36.0–46.0)
Hemoglobin: 12.6 g/dL (ref 12.0–15.0)
Potassium: 4 mmol/L (ref 3.5–5.1)
Sodium: 142 mmol/L (ref 135–145)
TCO2: 28 mmol/L (ref 22–32)

## 2020-11-30 SURGERY — CYSTOURETEROSCOPY, WITH RETROGRADE PYELOGRAM AND STENT INSERTION
Anesthesia: General | Site: Ureter | Laterality: Left

## 2020-11-30 MED ORDER — CEFAZOLIN SODIUM-DEXTROSE 2-4 GM/100ML-% IV SOLN
2.0000 g | INTRAVENOUS | Status: AC
  Administered 2020-11-30: 2 g via INTRAVENOUS

## 2020-11-30 MED ORDER — ONDANSETRON HCL 4 MG/2ML IJ SOLN
INTRAMUSCULAR | Status: DC | PRN
Start: 2020-11-30 — End: 2020-11-30
  Administered 2020-11-30: 4 mg via INTRAVENOUS

## 2020-11-30 MED ORDER — 0.9 % SODIUM CHLORIDE (POUR BTL) OPTIME
TOPICAL | Status: DC | PRN
  Administered 2020-11-30: 500 mL

## 2020-11-30 MED ORDER — LIDOCAINE 2% (20 MG/ML) 5 ML SYRINGE
INTRAMUSCULAR | Status: DC | PRN
  Administered 2020-11-30: 80 mg via INTRAVENOUS

## 2020-11-30 MED ORDER — CEPHALEXIN 500 MG PO CAPS: 500.0000 mg | ORAL_CAPSULE | Freq: Two times a day (BID) | ORAL | 0 refills | Status: AC

## 2020-11-30 MED ORDER — LACTATED RINGERS IV SOLN: INTRAVENOUS | Status: DC

## 2020-11-30 MED ORDER — FENTANYL CITRATE (PF) 100 MCG/2ML IJ SOLN
INTRAMUSCULAR | Status: DC | PRN
  Administered 2020-11-30: 50 ug via INTRAVENOUS
  Administered 2020-11-30 (×2): 25 ug via INTRAVENOUS

## 2020-11-30 MED ORDER — FENTANYL CITRATE (PF) 100 MCG/2ML IJ SOLN: 25.0000 ug | INTRAMUSCULAR | Status: DC | PRN

## 2020-11-30 MED ORDER — ONDANSETRON HCL 4 MG/2ML IJ SOLN
INTRAMUSCULAR | Status: AC
  Filled 2020-11-30: qty 2

## 2020-11-30 MED ORDER — DEXAMETHASONE SODIUM PHOSPHATE 10 MG/ML IJ SOLN
INTRAMUSCULAR | Status: AC
  Filled 2020-11-30: qty 1

## 2020-11-30 MED ORDER — PHENYLEPHRINE 40 MCG/ML (10ML) SYRINGE FOR IV PUSH (FOR BLOOD PRESSURE SUPPORT)
PREFILLED_SYRINGE | INTRAVENOUS | Status: AC
  Filled 2020-11-30: qty 10

## 2020-11-30 MED ORDER — FENTANYL CITRATE (PF) 100 MCG/2ML IJ SOLN
INTRAMUSCULAR | Status: AC
  Filled 2020-11-30: qty 2

## 2020-11-30 MED ORDER — CEFAZOLIN SODIUM-DEXTROSE 2-4 GM/100ML-% IV SOLN
INTRAVENOUS | Status: AC
  Filled 2020-11-30: qty 100

## 2020-11-30 MED ORDER — SODIUM CHLORIDE 0.9 % IR SOLN
Status: DC | PRN
  Administered 2020-11-30 (×2): 3000 mL via INTRAVESICAL

## 2020-11-30 MED ORDER — PROPOFOL 10 MG/ML IV BOLUS
INTRAVENOUS | Status: DC | PRN
  Administered 2020-11-30: 130 mg via INTRAVENOUS
  Administered 2020-11-30: 40 mg via INTRAVENOUS

## 2020-11-30 MED ORDER — IOHEXOL 300 MG/ML  SOLN
INTRAMUSCULAR | Status: DC | PRN
  Administered 2020-11-30: 10 mL

## 2020-11-30 MED ORDER — PROPOFOL 10 MG/ML IV BOLUS
INTRAVENOUS | Status: AC
  Filled 2020-11-30: qty 20

## 2020-11-30 MED ORDER — ARTIFICIAL TEARS OPHTHALMIC OINT
TOPICAL_OINTMENT | OPHTHALMIC | Status: AC
  Filled 2020-11-30: qty 3.5

## 2020-11-30 MED ORDER — DEXAMETHASONE SODIUM PHOSPHATE 10 MG/ML IJ SOLN
INTRAMUSCULAR | Status: DC | PRN
  Administered 2020-11-30: 10 mg via INTRAVENOUS

## 2020-11-30 MED ORDER — MIDAZOLAM HCL 2 MG/2ML IJ SOLN
INTRAMUSCULAR | Status: AC
  Filled 2020-11-30: qty 2

## 2020-11-30 MED ORDER — DROPERIDOL 2.5 MG/ML IJ SOLN: 0.6250 mg | Freq: Once | INTRAMUSCULAR | Status: DC | PRN

## 2020-11-30 MED ORDER — MIDAZOLAM HCL 5 MG/5ML IJ SOLN
INTRAMUSCULAR | Status: DC | PRN
  Administered 2020-11-30: 1 mg via INTRAVENOUS

## 2020-11-30 MED ORDER — PHENYLEPHRINE 40 MCG/ML (10ML) SYRINGE FOR IV PUSH (FOR BLOOD PRESSURE SUPPORT)
PREFILLED_SYRINGE | INTRAVENOUS | Status: DC | PRN
  Administered 2020-11-30: 40 ug via INTRAVENOUS
  Administered 2020-11-30: 80 ug via INTRAVENOUS
  Administered 2020-11-30: 40 ug via INTRAVENOUS
  Administered 2020-11-30: 80 ug via INTRAVENOUS

## 2020-11-30 SURGICAL SUPPLY — 27 items
BAG DRAIN URO-CYSTO SKYTR STRL (DRAIN) ×2 IMPLANT
BAG DRN UROCATH (DRAIN) ×1
BASKET ZERO TIP NITINOL 2.4FR (BASKET) ×2 IMPLANT
BSKT STON RTRVL ZERO TP 2.4FR (BASKET) ×1
CATH INTERMIT  6FR 70CM (CATHETERS) ×2 IMPLANT
CLOTH BEACON ORANGE TIMEOUT ST (SAFETY) ×2 IMPLANT
COVER DOME SNAP 22 D (MISCELLANEOUS) ×2 IMPLANT
ELECT REM PT RETURN 9FT ADLT (ELECTROSURGICAL)
ELECTRODE REM PT RTRN 9FT ADLT (ELECTROSURGICAL) IMPLANT
FIBER LASER FLEXIVA 200 (UROLOGICAL SUPPLIES) IMPLANT
FIBER LASER FLEXIVA 365 (UROLOGICAL SUPPLIES) IMPLANT
GLOVE SURG ENC MOIS LTX SZ8 (GLOVE) ×2 IMPLANT
GOWN STRL REUS W/TWL LRG LVL3 (GOWN DISPOSABLE) ×2 IMPLANT
GUIDEWIRE ANG ZIPWIRE 038X150 (WIRE) ×2 IMPLANT
GUIDEWIRE STR DUAL SENSOR (WIRE) ×2 IMPLANT
IV NS IRRIG 3000ML ARTHROMATIC (IV SOLUTION) ×4 IMPLANT
KIT TURNOVER CYSTO (KITS) ×2 IMPLANT
MANIFOLD NEPTUNE II (INSTRUMENTS) ×2 IMPLANT
NS IRRIG 500ML POUR BTL (IV SOLUTION) ×2 IMPLANT
PACK CYSTO (CUSTOM PROCEDURE TRAY) ×2 IMPLANT
SHEATH URETERAL 12FRX28CM (UROLOGICAL SUPPLIES) ×2 IMPLANT
SHEATH URETERAL 12FRX35CM (MISCELLANEOUS) IMPLANT
STENT URET 6FRX24 CONTOUR (STENTS) ×2 IMPLANT
TRACTIP FLEXIVA PULS ID 200XHI (Laser) ×1 IMPLANT
TRACTIP FLEXIVA PULSE ID 200 (Laser) ×2
TUBE CONNECTING 12X1/4 (SUCTIONS) IMPLANT
TUBING UROLOGY SET (TUBING) IMPLANT

## 2020-11-30 NOTE — Discharge Instructions (Addendum)
You may see some blood in the urine and may have some burning with urination for 48-72 hours. You also may notice that you have to urinate more frequently or urgently after your procedure which is normal.  You should call should you develop an inability urinate, fever > 101, persistent nausea and vomiting that prevents you from eating or drinking to stay hydrated.  If you have a stent, you will likely urinate more frequently and urgently until the stent is removed and you may experience some discomfort/pain in the lower abdomen and flank especially when urinating. You may take pain medication prescribed to you if needed for pain. You may also intermittently have blood in the urine until the stent is removed. If you have a catheter, you will be taught how to take care of the catheter by the nursing staff prior to discharge from the hospital.  You may periodically feel a strong urge to void with the catheter in place.  This is a bladder spasm and most often can occur when having a bowel movement or moving around. It is typically self-limited and usually will stop after a few minutes.  You may use some Vaseline or Neosporin around the tip of the catheter to reduce friction at the tip of the penis. You may also see some blood in the urine.  A very small amount of blood can make the urine look quite red.  As long as the catheter is draining well, there usually is not a problem.  However, if the catheter is not draining well and is bloody, you should call the office [phone]) to notify us.  [facility] [phone] Post Ureteroscopy With Stent Instructions  Definitions:  Ureter: The duct that transports urine from the kidney to the bladder. Stent:   A plastic hollow tube that is placed into the ureter, from the kidney to the bladder to prevent the ureter from swelling shut.  GENERAL INSTRUCTIONS:  Despite the fact that no skin incisions were used, the area around the ureter and  bladder is raw and irritated. The stent is a foreign body which will further irritate the bladder wall. This irritation is manifested by increased frequency of urination, both day and night, and by an increase in the urge to urinate. In some, the urge to urinate is present almost always. Sometimes the urge is strong enough that you may not be able to stop yourself from urinating. The only real cure is to remove the stent and then give time for the bladder wall to heal which can't be done until the danger of the ureter swelling shut has passed, which varies.  You may see some blood in your urine while the stent is in place and a few days afterwards. Do not be alarmed, even if the urine was clear for a while. Get off your feet and drink lots of fluids until clearing occurs. If you start to pass clots or don't improve, call us.  DIET: You may return to your normal diet immediately. Because of the raw surface of your bladder, alcohol, spicy foods, acid type foods and drinks with caffeine may cause irritation or frequency and should be used in moderation. To keep your urine flowing freely and to avoid constipation, drink plenty of fluids during the day ( 8-10 glasses ). Tip: Avoid cranberry juice because it is very acidic.  ACTIVITY: Your physical activity doesn't need to be restricted. However, if you are very active, you may see some blood in your urine.  We suggest that you reduce your activity under these circumstances until the bleeding has stopped.  BOWELS: It is important to keep your bowels regular during the postoperative period. Straining with bowel movements can cause bleeding. A bowel movement every other day is reasonable. Use a mild laxative if needed, such as Milk of Magnesia 2-3 tablespoons, or 2 Dulcolax tablets. Call if you continue to have problems. If you have been taking narcotics for pain, before, during or after your surgery, you may be constipated. Take a laxative if  necessary.   MEDICATION: You should resume your pre-surgery medications unless told not to. In addition you will often be given an antibiotic to prevent infection. These should be taken as prescribed until the bottles are finished unless you are having an unusual reaction to one of the drugs.  PROBLEMS YOU SHOULD REPORT TO [place]: Fevers over 100.5 Fahrenheit. Heavy bleeding, or clots ( See above notes about blood in urine ). Inability to urinate. Drug reactions ( hives, rash, nausea, vomiting, diarrhea ). Severe burning or pain with urination that is not improving.  FOLLOW-UP: You will need a follow-up appointment to monitor your progress. Call for this appointment at the number listed above. Usually the first appointment will be about three to fourteen days after your surgery.   Post Anesthesia Home Care Instructions  Activity: Get plenty of rest for the remainder of the day. A responsible individual must stay with you for 24 hours following the procedure.  For the next 24 hours, DO NOT: -Drive a car -Paediatric nurse -Drink alcoholic beverages -Take any medication unless instructed by your physician -Make any legal decisions or sign important papers.  Meals: Start with liquid foods such as gelatin or soup. Progress to regular foods as tolerated. Avoid greasy, spicy, heavy foods. If nausea and/or vomiting occur, drink only clear liquids until the nausea and/or vomiting subsides. Call your physician if vomiting continues.  Special Instructions/Symptoms: Your throat may feel dry or sore from the anesthesia or the breathing tube placed in your throat during surgery. If this causes discomfort, gargle with warm salt water. The discomfort should disappear within 24 hours.

## 2020-11-30 NOTE — H&P (Signed)
H&P  Chief Complaint: Left-sided kidney stone  History of Present Illness: Nicole [name] is a 70 y.o. year old female with history of urolithiasis, specifically left-sided renal calculi.  She presents at this time for management of increasing left lower pole stone burden.  Past Medical History:  Diagnosis Date   Arthritis    shorulder   B12 deficiency    Chronic diarrhea SECONDARY HX COLON CANCER   History of colon cancer [facility] SYNDROME ---  COLORECTAL CANCER S/P COLECTOMY X3  LAST ONE 2007   NO RECURRENCE   History of colon cancer 05/04/2013   History of kidney infection 08/2013   History of kidney stones    History of MRSA infection 08/2013   left leg   Hx of osteopenia    Lynch syndrome 10/17/2010   Hereditary colon cancer   NHL (non-Hodgkin's lymphoma) ([place]) 10/17/2010   MONITORED BY DR Tressie Stalker   Personal history of renal cancer S/P LEFT PARTIAL NEPHRECTOMY 2007--  NO RECURRENCE   DUE TO KIDNEY CANCER   Right shoulder pain S/P ROTATOR CUFF REPAIR    Past Surgical History:  Procedure Laterality Date   ABDOMINAL HYSTERECTOMY     APPENDECTOMY  1993   W/ LAVH   COLONOSCOPY W/ POLYPECTOMY     CYSTOSCOPY W/ RETROGRADES  07/14/2014   Procedure: CYSTOSCOPY WITH RETROGRADE PYELOGRAM;  Surgeon: Franchot Gallo, MD;  Location: [facility];  Service: Urology;;   CYSTOSCOPY W/ URETERAL STENT REMOVAL Left 07/14/2014   Procedure: CYSTOSCOPY WITH STENT REMOVAL;  Surgeon: Franchot Gallo, MD;  Location: [facility];  Service: Urology;  Laterality: Left;   CYSTOSCOPY WITH STENT PLACEMENT  02/07/2012   Procedure: CYSTOSCOPY WITH STENT PLACEMENT;  Surgeon: Franchot Gallo, MD;  Location: [facility];  Service: Urology;  Laterality: Left;   CYSTOSCOPY WITH STENT PLACEMENT Left 07/14/2014   Procedure: CYSTOSCOPY WITH STENT PLACEMENT;  Surgeon: Franchot Gallo, MD;  Location: [facility];  Service: Urology;  Laterality: Left;    FLEXIBLE SIGMOIDOSCOPY  08/29/2011   Procedure: FLEXIBLE SIGMOIDOSCOPY;  Surgeon: Rogene Houston, MD;  [facility];  Service: Endoscopy;  Laterality: N/A;  [place] N/A 09/28/2012   Procedure: FLEXIBLE SIGMOIDOSCOPY;  Surgeon: Rogene Houston, MD;  [facility];  Service: Endoscopy;  Laterality: N/A;  [place] N/A 07/08/2014   Procedure: FLEXIBLE SIGMOIDOSCOPY;  Surgeon: Rogene Houston, MD;  [facility];  Service: Endoscopy;  Laterality: N/A;  830 - moved to 10:20 - Ann to notify pt   FLEXIBLE SIGMOIDOSCOPY N/A 08/11/2015   Procedure: FLEXIBLE SIGMOIDOSCOPY;  Surgeon: Rogene Houston, MD;  [facility];  Service: Endoscopy;  Laterality: N/A;  [place] N/A 08/13/2016   Procedure: FLEXIBLE SIGMOIDOSCOPY;  Surgeon: Rogene Houston, MD;  [facility];  Service: Endoscopy;  Laterality: N/A;  [place] N/A 10/06/2019   Procedure: FLEXIBLE SIGMOIDOSCOPY;  Surgeon: Rogene Houston, MD;  [facility];  Service: Endoscopy;  Laterality: N/A;  1245   HEMICOLECTOMY  2001   right   HEMICOLECTOMY  2002   left   HOLMIUM LASER APPLICATION Left 0000000   Procedure: HOLMIUM LASER LITHOTRIPSY,;  Surgeon: Franchot Gallo, MD;  Location: [facility];  Service: Urology;  Laterality: Left;   IR RADIOLOGIST EVAL & MGMT  04/29/2018   IR RADIOLOGIST EVAL & MGMT  10/07/2019   IR  RADIOLOGIST EVAL & MGMT  10/11/2020   kidney resection  2007   left partial   LAPAROSCOPIC ASSISTED VAGINAL HYSTERECTOMY  1993   W/ BILATERAL SALPINGO-OOPHORECTOMY   LEFT FLANK EXPLORATION W/  PARTIAL LEFT NEPHRECTOMY/ LEFT HILAR LYMPH NODE BX  04-17-2005  DR Denetta Fei   RENAL CELL CARCINOMA   LITHOTRIPSY  11-22013   x2   NEPHROLITHOTOMY Left 06/09/2014   Procedure: NEPHROLITHOTOMY PERCUTANEOUS;  Surgeon: Franchot Gallo, MD;  [facility];  Service: Urology;  Laterality:  Left;  with STENT   RADIOLOGY WITH ANESTHESIA Left 05/20/2018   Procedure: CT WITH ANESTHESIA LEFT RENAL CRYOABLATION AND BIOPSY;  Surgeon: Greggory Keen, MD;  [facility];  Service: Anesthesiology;  Laterality: Left;   RIGHT ROTATOR  CUFF REPAIR/   BICEP REPAIR  10-17-2011  DR SUPPLE   SUBTOTAL COLECTOMY  2007   URETEROSCOPY WITH HOLMIUM LASER LITHOTRIPSY Left 07/14/2014   Procedure: URETEROSCOPY  EXTRACTION OF STONES;  Surgeon: Franchot Gallo, MD;  Location: [facility];  Service: Urology;  Laterality: Left;    Home Medications:  Medications Prior to Admission  Medication Sig Dispense Refill   Cholecalciferol (VITAMIN D3) 125 MCG (5000 UT) CAPS Take 5,000 Units by mouth daily.     cyanocobalamin (,VITAMIN B-12,) 1000 MCG/ML injection ADMINISTER 1 ML(1000 MCG) IN THE MUSCLE EVERY 21 DAYS 1 mL 17   furosemide (LASIX) 40 MG tablet Take 40 mg by mouth daily as needed for fluid.      milk thistle 175 MG tablet Take 1,000 mg by mouth daily.     phentermine 37.5 MG capsule Take 37.5 mg by mouth daily.     Potassium Citrate 15 MEQ (1620 MG) TBCR TAKE 1 TABLET BY MOUTH THREE TIMES DAILY 270 tablet 3   Potassium Citrate 15 MEQ (1620 MG) TBCR Take 1 tablet by mouth 3 (three) times daily. 270 tablet 0   diphenoxylate-atropine (LOMOTIL) 2.5-0.025 MG tablet Take 1 tablet by mouth 4 (four) times daily as needed for diarrhea or loose stools. (Patient not taking: No sig reported)     ibuprofen (ADVIL,MOTRIN) 800 MG tablet Take 800 mg by mouth 3 (three) times daily as needed for moderate pain.  (Patient not taking: No sig reported)  11    Allergies:  Allergies  Allergen Reactions   Latex Rash   Morphine And Related Other (See Comments)    hallucinations   Tape Rash    Certain tapes     Family History  Problem Relation Age of Onset   Cancer Son     Social History:  reports that she has never smoked. She has never used smokeless tobacco. She reports current alcohol use. She  reports that she does not use drugs.  ROS: A complete review of systems was performed.  All systems are negative except for pertinent findings as noted.  Physical Exam:  Vital signs in last 24 hours: Temp:  [98.5 F (36.9 C)] 98.5 F (36.9 C) (09/15 0537) Pulse Rate:  [97] 97 (09/15 0537) Resp:  [17] 17 (09/15 0537) BP: (187)/(84) 187/84 (09/15 0537) SpO2:  [99 %] 99 % (09/15 0537) Weight:  [84.6 kg] 84.6 kg (09/15 0537) General:  Alert and oriented, No acute distress HEENT: Normocephalic, atraumatic Neck: No JVD or lymphadenopathy Cardiovascular: Regular rate  Lungs: Normal inspiratory/expiratory excursion Abdomen: Soft, nontender, nondistended, no abdominal masses Back: No CVA tenderness Extremities: No edema Neurologic: Grossly intact  Laboratory Data:  Results for orders placed or performed during the hospital encounter of  11/30/20 (from the past 24 hour(s))  I-STAT, chem 8     Status: Abnormal   Collection Time: 11/30/20  6:45 AM  Result Value Ref Range   Sodium 142 135 - 145 mmol/L   Potassium 4.0 3.5 - 5.1 mmol/L   Chloride 103 98 - 111 mmol/L   BUN 19 8 - 23 mg/dL   Creatinine, Ser 0.70 0.44 - 1.00 mg/dL   Glucose, Bld 105 (H) 70 - 99 mg/dL   Calcium, Ion 1.27 1.15 - 1.40 mmol/L   TCO2 28 22 - 32 mmol/L   Hemoglobin 12.6 12.0 - 15.0 g/dL   HCT 37.0 36.0 - 46.0 %   No results found for this or any previous visit (from the past 240 hour(s)). Creatinine: Recent Labs    11/30/20 0645  CREATININE 0.70    Radiologic Imaging: No results found.  Impression/Assessment:  Large left lower pole renal calculus  Plan:  Cystoscopy, left retrograde ureteropyelogram, ureteroscopy, holmium laser fragmentation and extraction of left renal calculus, double-J stent  Lillette Boxer Abri Vacca 11/30/2020, 7:31 AM  Lillette Boxer. Fareedah Mahler MD

## 2020-11-30 NOTE — Op Note (Signed)
Preoperative diagnosis: Large, branched left lower pole renal calculus  Postoperative diagnosis: Same  Principal procedure: Cystoscopy, left retrograde ureteropyelogram, fluoroscopic interpretation, left ureteroscopy, holmium laser dusting of left lower pole renal calculi, placement of 6 French by 24 cm contour double-J stent without tether  Surgeon: Emeli Goguen  Anesthesia: General with LMA  Complications: None  Estimated blood loss: Less than 50 mL  Specimen: None  Indications: 70 year old female with recurrent urolithiasis, primarily the left lower pole.  I think she has dystrophic calcifications in her left lower pole calyceal system from prior partial nephrectomy.  These No Doubt are seeding opportunities for new stone formation.  She is followed for this with CT scans for oncologic surveillance of prior colon cancer.  Recent findings revealed enlarging branched left lower pole renal calculi.  I have recommended management of this.  The patient does not want to undergo percutaneous nephrolithotomy which she has had before.  Unfortunately, this is too large for shockwave lithotripsy.  I discussed possible staged ureteroscopic management with the patient.  Risks, complications have been discussed, she is aware of these from having had prior interventions with ureteroscopy.  She desires to proceed.  Findings: The upper pole calyceal system was mildly dilated.  There were no calculi in the upper pole calyceal system either directly seen ureteroscopically or on prior imaging.  There was a large branched stone in the left lower pole calyceal system with multiple smaller rounded stone seen as well.  Basically, the stone burden took up the whole lower calyceal system.  There was a dystrophic calcification in the lower/interpolar calyx closely applied to the wall of the calyx, this may well of been the dystrophic calcification creating a seed affect.  Description of procedure: The patient was properly  identified and marked in the holding area.  She received preoperative IV antibiotics.  She was taken to the operating room where general anesthetic was administered with the LMA.  She was placed in the dorsolithotomy position.  Genitalia and perineum were prepped, draped, proper timeout performed.  21 French panendoscope advanced into the bladder with circumferential inspection findings normal ureteral orifice ease and urothelium.  Left retrograde ureteropyelogram was performed using Omnipaque and a 6 [place] open-ended catheter.  Ureter was normal throughout.  Upper pole calyceal system was normal, there was significant filling defects in the lower pole calyceal system consistent with her stone burden.  Following this a 12/14 ureteral access catheter was placed with a safety wire utilized.  The flexible digital ureteroscope was then advanced through this.  I inspected the upper pole calyceal system.  A few small Randall's plaques were noted but no stones.  There was a large branch stone protruding into the infundibulum on the left.  I then negotiated a 200 m laser fiber through this.  Using energy settings of 53 Hz and 0.8 J, the stone was dusted.  The larger burden of the stone was quite easy to get to and was easily managed with the laser set at dusting settings.  Treatment began from medial to lateral.  Once I moved more towards the calyces, some small rounded stones were noted in the most upper calyx where stones were found.  I utilized the same laser settings to fragment the smaller stones into dust like fragments.  Laser energy was continued on all of the visible stone, including the 1 stone that was on the calyceal wall that I felt was a dystrophic calcification.  This was treated down flush with the wall of the  calyx.  Up until this point, all stone fragments were so small that I felt that they would easily passed beside the eventual stent placement.  The most difficult stone burden management was the  very lower, medial calyx.  I could get probably 80 to 90% of the stone treated with laser energy.  I was unable to negotiate the stone into another calyx utilizing a basket.  However, using diligence, I treated as much of the stone as possible.  There were limitations due to the inability to negotiate the very tip of the scope even with extreme torque.  Once all other stone burden was treated and there were multiple dust like fragments, the procedure was terminated.  There was a mild amount of bleeding from the lower pole calyx from the laser ablation.  However, I felt like this was easily stopped with stenting.  I then removed the flexible scope.  The ureteral access catheter was also removed.  The guidewire was backloaded through the cystoscope and a 6 [place] by 24 cm contour double-J stent with tether removed was deployed in the ureter with excellent proximal and distal curl seen using fluoroscopy and cystoscopy, respectively.  The bladder was drained, the scope removed and the procedure terminated.  The patient was awakened, taken to the PACU in stable condition having tolerated the procedure well

## 2020-11-30 NOTE — Anesthesia Procedure Notes (Signed)
Procedure Name: LMA Insertion Date/Time: 11/30/2020 7:43 AM Performed by: Rogers Blocker, CRNA Pre-anesthesia Checklist: Patient identified, Emergency Drugs available, Suction available and Patient being monitored Patient Re-evaluated:Patient Re-evaluated prior to induction Oxygen Delivery Method: Circle System Utilized Preoxygenation: Pre-oxygenation with 100% oxygen Induction Type: IV induction Ventilation: Mask ventilation without difficulty LMA: LMA inserted LMA Size: 4.0 Number of attempts: 1 Placement Confirmation: positive ETCO2 Tube secured with: Tape Dental Injury: Teeth and Oropharynx as per pre-operative assessment

## 2020-11-30 NOTE — Anesthesia Postprocedure Evaluation (Signed)
Anesthesia Post Note  Patient: Nicole [name]  Procedure(s) Performed: CYSTOSCOPY WITH RETROGRADE PYELOGRAM, URETEROSCOPY/STONE EXTRACTION AND STENT PLACEMENT (Left: Ureter) HOLMIUM LASER APPLICATION (Left: Ureter)     Patient location during evaluation: PACU Anesthesia Type: General Level of consciousness: sedated and patient cooperative Pain management: pain level controlled Vital Signs Assessment: post-procedure vital signs reviewed and stable Respiratory status: spontaneous breathing Cardiovascular status: stable Anesthetic complications: no   No notable events documented.  Last Vitals:  Vitals:   11/30/20 1015 11/30/20 1049  BP: (!) 158/76 (!) 167/74  Pulse: 90 85  Resp: 14 18  Temp:  36.5 C  SpO2: 99% 100%    Last Pain:  Vitals:   11/30/20 1049  TempSrc:   PainSc: 0-No pain                 Nolon Nations

## 2020-11-30 NOTE — Transfer of Care (Signed)
Immediate Anesthesia Transfer of Care Note  Patient: Nicole [name]  Procedure(s) Performed: CYSTOSCOPY WITH RETROGRADE PYELOGRAM, URETEROSCOPY/STONE EXTRACTION AND STENT PLACEMENT (Left: Ureter) HOLMIUM LASER APPLICATION (Left: Ureter)  Patient Location: PACU  Anesthesia Type:General  Level of Consciousness: awake, alert , oriented and patient cooperative  Airway & Oxygen Therapy: Patient Spontanous Breathing Nasal cannula O2  Post-op Assessment: Report given to RN and Post -op Vital signs reviewed and stable  Post vital signs: Reviewed and stable  Last Vitals:  Vitals Value Taken Time  BP 147/77 11/30/20 0947  Temp    Pulse 80 11/30/20 0949  Resp 17 11/30/20 0949  SpO2 100 % 11/30/20 0949  Vitals shown include unvalidated device data.  Last Pain:  Vitals:   11/30/20 0537  TempSrc: Oral         Complications: No notable events documented.

## 2020-12-01 ENCOUNTER — Encounter (HOSPITAL_BASED_OUTPATIENT_CLINIC_OR_DEPARTMENT_OTHER): Payer: Self-pay | Admitting: Urology

## 2020-12-18 NOTE — Progress Notes (Signed)
History of Present Illness: Nicole [name] is a 70 y.o. year old female here for follow-up of recent ureteroscopic management of left renal calculi.  History of urolithiasis:  She underwent left PCNL March, 2016 followed by left-sided ureteroscopy with laser and extraction of residual left renal calculi in April 16. 24-hour urine showed low magnesium and citrate level. Stone analysis revealed carbonate apatite.   She is on potassium citrate 15 mEq tid--most of the time she just takes it twice a day, however.  9.15.2022: Cystoscopy, left retrograde, left ureteroscopy, laser ablation of multiple left lower pole calculi l, stent.  10.4.2022:   History of renal cell carcinoma, left:   She is status post left partial nephrectomy 1.31.2007 for renal cell carcinoma. Final pathology revealed stage T1a clear cell carcinoma, Fuhrman nuclear grade 3/4. Tumor size was 1.9 cm.   CT A/P 1/18 revealed no evidence of recurrence.   1.6.2020: CT scan revealed enhancing 19 mm left upper pole renal mass consistent with recurrent renal cell carcinoma. There is stable retroperitoneal/peri aortic adenopathy. This was treated w/ cryoablation on 3.4.2020.   1.13.2022: CT abdomen and pelvis revealed no evidence of recurrence of her renal cell carcinoma.  There is a continued left lower pole staghorn calculus measuring 20 mm maximum diameter.  There is stable retroperitoneal adenopathy.   6.28.2022:  She does have Lynch syndrome.    She did call me in January following her CT scan, images were reviewed backbend.  She is still on potassium citrate 2-3 times a day.   Past Medical History:  Diagnosis Date   Arthritis    shorulder   B12 deficiency    Chronic diarrhea SECONDARY HX COLON CANCER   History of colon cancer [facility] SYNDROME ---  COLORECTAL CANCER S/P COLECTOMY X3  LAST ONE 2007   NO RECURRENCE   History of colon cancer 05/04/2013   History of kidney infection 08/2013   History of kidney stones     History of MRSA infection 08/2013   left leg   Hx of osteopenia    Lynch syndrome 10/17/2010   Hereditary colon cancer   NHL (non-Hodgkin's lymphoma) ([place]) 10/17/2010   MONITORED BY DR Tressie Stalker   Personal history of renal cancer S/P LEFT PARTIAL NEPHRECTOMY 2007--  NO RECURRENCE   DUE TO KIDNEY CANCER   Right shoulder pain S/P ROTATOR CUFF REPAIR    Past Surgical History:  Procedure Laterality Date   ABDOMINAL HYSTERECTOMY     APPENDECTOMY  1993   W/ LAVH   COLONOSCOPY W/ POLYPECTOMY     CYSTOSCOPY W/ RETROGRADES  07/14/2014   Procedure: CYSTOSCOPY WITH RETROGRADE PYELOGRAM;  Surgeon: Franchot Gallo, MD;  Location: [facility];  Service: Urology;;   CYSTOSCOPY W/ URETERAL STENT REMOVAL Left 07/14/2014   Procedure: CYSTOSCOPY WITH STENT REMOVAL;  Surgeon: Franchot Gallo, MD;  Location[facility];  Service: Urology;  Laterality: Left;   CYSTOSCOPY WITH RETROGRADE PYELOGRAM, URETEROSCOPY AND STENT PLACEMENT Left 11/30/2020   Procedure: CYSTOSCOPY WITH RETROGRADE PYELOGRAM, URETEROSCOPY/STONE EXTRACTION AND STENT PLACEMENT;  Surgeon: Franchot Gallo, MD;  Location: [facility];  Service: Urology;  Laterality: Left;   CYSTOSCOPY WITH STENT PLACEMENT  02/07/2012   Procedure: CYSTOSCOPY WITH STENT PLACEMENT;  Surgeon: Franchot Gallo, MD;  Location: [facility];  Service: Urology;  Laterality: Left;   CYSTOSCOPY WITH STENT PLACEMENT Left 07/14/2014   Procedure: CYSTOSCOPY WITH STENT PLACEMENT;  Surgeon: Franchot Gallo, MD;  Location: [facility];  Service: Urology;  Laterality: Left;   FLEXIBLE SIGMOIDOSCOPY  08/29/2011   Procedure: FLEXIBLE SIGMOIDOSCOPY;  Surgeon: Rogene Houston, MD;  [facility];  Service: Endoscopy;  Laterality: N/A;  [place] N/A 09/28/2012   Procedure: FLEXIBLE SIGMOIDOSCOPY;  Surgeon: Rogene Houston, MD;  [facility];  Service: Endoscopy;   Laterality: N/A;  [place] N/A 07/08/2014   Procedure: FLEXIBLE SIGMOIDOSCOPY;  Surgeon: Rogene Houston, MD;  [facility];  Service: Endoscopy;  Laterality: N/A;  830 - moved to 10:20 - Ann to notify pt   FLEXIBLE SIGMOIDOSCOPY N/A 08/11/2015   Procedure: FLEXIBLE SIGMOIDOSCOPY;  Surgeon: Rogene Houston, MD;  [facility];  Service: Endoscopy;  Laterality: N/A;  [place] N/A 08/13/2016   Procedure: FLEXIBLE SIGMOIDOSCOPY;  Surgeon: Rogene Houston, MD;  [facility];  Service: Endoscopy;  Laterality: N/A;  [place] N/A 10/06/2019   Procedure: FLEXIBLE SIGMOIDOSCOPY;  Surgeon: Rogene Houston, MD;  [facility];  Service: Endoscopy;  Laterality: N/A;  1245   HEMICOLECTOMY  2001   right   HEMICOLECTOMY  2002   left   HOLMIUM LASER APPLICATION Left 7/00/1749   Procedure: HOLMIUM LASER LITHOTRIPSY,;  Surgeon: Franchot Gallo, MD;  Location: [facility];  Service: Urology;  Laterality: Left;   HOLMIUM LASER APPLICATION Left 4/49/6759   Procedure: HOLMIUM LASER APPLICATION;  Surgeon: Franchot Gallo, MD;  Location: [facility];  Service: Urology;  Laterality: Left;   IR RADIOLOGIST EVAL & MGMT  04/29/2018   IR RADIOLOGIST EVAL & MGMT  10/07/2019   IR RADIOLOGIST EVAL & MGMT  10/11/2020   kidney resection  2007   left partial   LAPAROSCOPIC ASSISTED VAGINAL HYSTERECTOMY  1993   W/ BILATERAL SALPINGO-OOPHORECTOMY   LEFT FLANK EXPLORATION W/  PARTIAL LEFT NEPHRECTOMY/ LEFT HILAR LYMPH NODE BX  04-17-2005  DR Yilia Sacca   RENAL CELL CARCINOMA   LITHOTRIPSY  11-22013   x2   NEPHROLITHOTOMY Left 06/09/2014   Procedure: NEPHROLITHOTOMY PERCUTANEOUS;  Surgeon: Franchot Gallo, MD;  [facility];  Service: Urology;  Laterality: Left;  with STENT   RADIOLOGY WITH ANESTHESIA Left 05/20/2018   Procedure: CT WITH ANESTHESIA LEFT RENAL CRYOABLATION AND BIOPSY;   Surgeon: Greggory Keen, MD;  [facility];  Service: Anesthesiology;  Laterality: Left;   RIGHT ROTATOR  CUFF REPAIR/   BICEP REPAIR  10-17-2011  DR SUPPLE   SUBTOTAL COLECTOMY  2007   URETEROSCOPY WITH HOLMIUM LASER LITHOTRIPSY Left 07/14/2014   Procedure: URETEROSCOPY  EXTRACTION OF STONES;  Surgeon: Franchot Gallo, MD;  Location: [facility];  Service: Urology;  Laterality: Left;    Home Medications:  (Not in a hospital admission)   Allergies:  Allergies  Allergen Reactions   Latex Rash   Morphine And Related Other (See Comments)    hallucinations   Tape Rash    Certain tapes     Family History  Problem Relation Age of Onset   Cancer Son     Social History:  reports that she has never smoked. She has never used smokeless tobacco. She reports current alcohol use. She reports that she does not use drugs.  ROS: A complete review of systems was performed.  All systems are negative except for pertinent findings as noted.  Physical Exam:  Vital signs in last 24 hours: @VSRANGES @ General:  Alert and oriented, No acute distress  HEENT: Normocephalic, atraumatic Neck: No JVD or lymphadenopathy Cardiovascular: Regular rate  Lungs: Normal inspiratory/expiratory excursion Abdomen: Soft, nontender, nondistended, no abdominal masses Back: No CVA tenderness Extremities: No edema Neurologic: Grossly intact I have reviewed prior pt notes  I have reviewed notes from referring/previous physicians  I have reviewed urinalysis results  I have independently reviewed prior imaging  I have reviewed prior urine culture  Cystoscopy Procedure Note:  Indication: Stent removal  After informed consent and discussion of the procedure and its risks, CHELLIE VANLUE was positioned and prepped in the standard fashion.  Cystoscopy was performed with a flexible cystoscope.   Findings: Urethra: Normal Ureteral orifices: Normal Bladder: Left ureteral stent present.   Urothelium of the bladder normal.  Stent was removed intact without difficulty  The patient tolerated the procedure well.    Impression/Assessment:  Large left lower pole, branched stone, status post ureteroscopic management with 1 moderate sized lower pole calculus remaining.  This could not be approached with ureteroscopy.  Otherwise, significant decrease in stone burden, she is doing well following her procedure  Plan:  I will see her back in 2 months with [place] 12/18/2020, 10:33 AM  Lillette Boxer. Slade Pierpoint MD

## 2020-12-19 ENCOUNTER — Other Ambulatory Visit: Payer: Self-pay

## 2020-12-19 ENCOUNTER — Telehealth: Payer: Self-pay

## 2020-12-19 ENCOUNTER — Ambulatory Visit: Payer: PPO | Admitting: Urology

## 2020-12-19 ENCOUNTER — Ambulatory Visit (HOSPITAL_COMMUNITY)
Admission: RE | Admit: 2020-12-19 | Discharge: 2020-12-19 | Disposition: A | Discharge status: Home / Self Care | Payer: PPO | Source: Ambulatory Visit | Attending: Urology | Admitting: Urology

## 2020-12-19 VITALS — BP 175/91 | HR 93

## 2020-12-19 DIAGNOSIS — N200 Calculus of kidney: Secondary | ICD-10-CM | POA: Diagnosis not present

## 2020-12-19 DIAGNOSIS — N390 Urinary tract infection, site not specified: Secondary | ICD-10-CM

## 2020-12-19 DIAGNOSIS — Z466 Encounter for fitting and adjustment of urinary device: Secondary | ICD-10-CM | POA: Diagnosis not present

## 2020-12-19 DIAGNOSIS — I878 Other specified disorders of veins: Secondary | ICD-10-CM | POA: Diagnosis not present

## 2020-12-19 DIAGNOSIS — N2889 Other specified disorders of kidney and ureter: Secondary | ICD-10-CM | POA: Diagnosis not present

## 2020-12-19 DIAGNOSIS — Z87442 Personal history of urinary calculi: Secondary | ICD-10-CM | POA: Diagnosis not present

## 2020-12-19 LAB — URINALYSIS, ROUTINE W REFLEX MICROSCOPIC
Bilirubin, UA: NEGATIVE
Glucose, UA: NEGATIVE
Ketones, UA: NEGATIVE
Nitrite, UA: NEGATIVE
RBC, UA: NEGATIVE
Specific Gravity, UA: 1.01 (ref 1.005–1.030)
Urobilinogen, Ur: 0.2 mg/dL (ref 0.2–1.0)
pH, UA: 6 (ref 5.0–7.5)

## 2020-12-19 LAB — MICROSCOPIC EXAMINATION
Epithelial Cells (non renal): >10 /hpf — AB (ref 0–10)
RBC, Urine: >30 /hpf — AB (ref 0–2)
Renal Epithel, UA: NONE SEEN /hpf
WBC, UA: >30 /hpf — AB (ref 0–5)

## 2020-12-19 MED ORDER — CIPROFLOXACIN HCL 500 MG PO TABS
500.0000 mg | ORAL_TABLET | Freq: Once | ORAL | Status: AC
  Administered 2020-12-19: 500 mg via ORAL

## 2020-12-19 NOTE — Telephone Encounter (Signed)
Opened in error

## 2020-12-19 NOTE — Progress Notes (Signed)

## 2021-01-03 DIAGNOSIS — E6609 Other obesity due to excess calories: Secondary | ICD-10-CM | POA: Diagnosis not present

## 2021-01-03 DIAGNOSIS — R030 Elevated blood-pressure reading, without diagnosis of hypertension: Secondary | ICD-10-CM | POA: Diagnosis not present

## 2021-01-11 DIAGNOSIS — L820 Inflamed seborrheic keratosis: Secondary | ICD-10-CM | POA: Diagnosis not present

## 2021-01-11 DIAGNOSIS — X32XXXA Exposure to sunlight, initial encounter: Secondary | ICD-10-CM | POA: Diagnosis not present

## 2021-01-11 DIAGNOSIS — L570 Actinic keratosis: Secondary | ICD-10-CM | POA: Diagnosis not present

## 2021-02-02 DIAGNOSIS — E6609 Other obesity due to excess calories: Secondary | ICD-10-CM | POA: Diagnosis not present

## 2021-02-22 DIAGNOSIS — C44319 Basal cell carcinoma of skin of other parts of face: Secondary | ICD-10-CM | POA: Diagnosis not present

## 2021-02-27 ENCOUNTER — Ambulatory Visit: Payer: PPO | Admitting: Urology

## 2021-02-27 ENCOUNTER — Other Ambulatory Visit: Payer: Self-pay

## 2021-02-27 ENCOUNTER — Ambulatory Visit (HOSPITAL_COMMUNITY)
Admission: RE | Admit: 2021-02-27 | Discharge: 2021-02-27 | Disposition: A | Discharge status: Home / Self Care | Payer: PPO | Source: Ambulatory Visit | Attending: Urology | Admitting: Urology

## 2021-02-27 ENCOUNTER — Encounter: Payer: Self-pay | Admitting: Urology

## 2021-02-27 VITALS — BP 175/94 | HR 99

## 2021-02-27 DIAGNOSIS — I878 Other specified disorders of veins: Secondary | ICD-10-CM | POA: Diagnosis not present

## 2021-02-27 DIAGNOSIS — N390 Urinary tract infection, site not specified: Secondary | ICD-10-CM | POA: Diagnosis not present

## 2021-02-27 DIAGNOSIS — N200 Calculus of kidney: Secondary | ICD-10-CM

## 2021-02-27 DIAGNOSIS — Z466 Encounter for fitting and adjustment of urinary device: Secondary | ICD-10-CM | POA: Diagnosis not present

## 2021-02-27 LAB — URINALYSIS, ROUTINE W REFLEX MICROSCOPIC
Bilirubin, UA: NEGATIVE
Glucose, UA: NEGATIVE
Ketones, UA: NEGATIVE
Leukocytes,UA: NEGATIVE
Nitrite, UA: NEGATIVE
Protein,UA: NEGATIVE
Specific Gravity, UA: 1.015 (ref 1.005–1.030)
Urobilinogen, Ur: 0.2 mg/dL (ref 0.2–1.0)
pH, UA: 6 (ref 5.0–7.5)

## 2021-02-27 LAB — MICROSCOPIC EXAMINATION: Renal Epithel, UA: NONE SEEN /hpf

## 2021-02-27 NOTE — Progress Notes (Signed)
Urological Symptom Review  Patient is experiencing the following symptoms: Hard to postpone urination Get up at night to urinate   Review of Systems  Gastrointestinal (upper)  : Negative for upper GI symptoms  Gastrointestinal (lower) : Negative for lower GI symptoms  Constitutional : Fatigue  Skin: Skin rash/lesion  Eyes: Negative for eye symptoms  Ear/Nose/Throat : Negative for Ear/Nose/Throat symptoms  Hematologic/Lymphatic: Easy bruising  Cardiovascular : Negative for cardiovascular symptoms  Respiratory : Negative for respiratory symptoms  Endocrine: Negative for endocrine symptoms  Musculoskeletal: Back pain Joint pain  Neurological: Negative for neurological symptoms  Psychologic: Negative for psychiatric symptoms

## 2021-02-27 NOTE — Progress Notes (Signed)
H&P  Chief Complaint: Follow-up for urolithiasis and history of renal cell carcinoma in her left kidney  History of Present Illness:   History of urolithiasis: She underwent left PCNL March, 2016 followed by left-sided ureteroscopy with laser and extraction of residual left renal calculi in April 16. 24-hour urine showed low magnesium and citrate level. Stone analysis revealed carbonate apatite.   She is on potassium citrate 15 mEq tid--most of the time she just takes it twice a day, however.     History of renal cell carcinoma: She is status post left partial nephrectomy 1.31.2007 for renal cell carcinoma. Final pathology revealed stage T1a clear cell carcinoma, Fuhrman nuclear grade 3/4. Tumor size was 1.9 cm.   CT A/P 1/18 revealed no evidence of recurrence.   1.6.2020: CT scan revealed enhancing 19 mm left upper pole renal mass consistent with recurrent renal cell carcinoma. There is stable retroperitoneal/peri aortic adenopathy. This was treated w/ cryoablation on 3.4.2020.    6.28.2022: She is followed by Dr. Delton Coombes.  She saw him in January of this year and got a clean bill of health.  She does have Lynch syndrome.  She has had no flank pain, urinary tract infections or gross hematuria.  She did call me in January following her CT scan, images were reviewed backbend.  She is still on potassium citrate 2-3 times a day.  9.15.2022: She underwent ureteroscopic management of large left lower pole branch stone with holmium laser.  Stent removed 3 weeks later.  12.13.2022: Since her last visit she denies any gross hematuria.  She does not feel like she has had any urinary tract infections.  She has had no pain.  She has not had KUB.  Past Medical History:  Diagnosis Date   Arthritis    shorulder   B12 deficiency    Chronic diarrhea SECONDARY HX COLON CANCER   History of colon cancer [facility] SYNDROME ---  COLORECTAL CANCER S/P COLECTOMY X3  LAST ONE 2007   NO RECURRENCE   History  of colon cancer 05/04/2013   History of kidney infection 08/2013   History of kidney stones    History of MRSA infection 08/2013   left leg   Hx of osteopenia    Lynch syndrome 10/17/2010   Hereditary colon cancer   NHL (non-Hodgkin's lymphoma) ([place]) 10/17/2010   MONITORED BY DR Tressie Stalker   Personal history of renal cancer S/P LEFT PARTIAL NEPHRECTOMY 2007--  NO RECURRENCE   DUE TO KIDNEY CANCER   Right shoulder pain S/P ROTATOR CUFF REPAIR    Past Surgical History:  Procedure Laterality Date   ABDOMINAL HYSTERECTOMY     APPENDECTOMY  1993   W/ LAVH   COLONOSCOPY W/ POLYPECTOMY     CYSTOSCOPY W/ RETROGRADES  07/14/2014   Procedure: CYSTOSCOPY WITH RETROGRADE PYELOGRAM;  Surgeon: Franchot Gallo, MD;  Location: [facility];  Service: Urology;;   CYSTOSCOPY W/ URETERAL STENT REMOVAL Left 07/14/2014   Procedure: CYSTOSCOPY WITH STENT REMOVAL;  Surgeon: Franchot Gallo, MD;  Location: [facility];  Service: Urology;  Laterality: Left;   CYSTOSCOPY WITH RETROGRADE PYELOGRAM, URETEROSCOPY AND STENT PLACEMENT Left 11/30/2020   Procedure: CYSTOSCOPY WITH RETROGRADE PYELOGRAM, URETEROSCOPY/STONE EXTRACTION AND STENT PLACEMENT;  Surgeon: Franchot Gallo, MD;  Location: [facility];  Service: Urology;  Laterality: Left;   CYSTOSCOPY WITH STENT PLACEMENT  02/07/2012   Procedure: CYSTOSCOPY WITH STENT PLACEMENT;  Surgeon: Franchot Gallo, MD;  Location[facility];  Service: Urology;  Laterality:  Left;   CYSTOSCOPY WITH STENT PLACEMENT Left 07/14/2014   Procedure: CYSTOSCOPY WITH STENT PLACEMENT;  Surgeon: Franchot Gallo, MD;  Location: [facility];  Service: Urology;  Laterality: Left;   FLEXIBLE SIGMOIDOSCOPY  08/29/2011   Procedure: FLEXIBLE SIGMOIDOSCOPY;  Surgeon: Rogene Houston, MD;  [facility];  Service: Endoscopy;  Laterality: N/A;  [place] N/A 09/28/2012   Procedure: FLEXIBLE  SIGMOIDOSCOPY;  Surgeon: Rogene Houston, MD;  [facility];  Service: Endoscopy;  Laterality: N/A;  [place] N/A 07/08/2014   Procedure: FLEXIBLE SIGMOIDOSCOPY;  Surgeon: Rogene Houston, MD;  [facility];  Service: Endoscopy;  Laterality: N/A;  830 - moved to 10:20 - Ann to notify pt   FLEXIBLE SIGMOIDOSCOPY N/A 08/11/2015   Procedure: FLEXIBLE SIGMOIDOSCOPY;  Surgeon: Rogene Houston, MD;  [facility];  Service: Endoscopy;  Laterality: N/A;  [place] N/A 08/13/2016   Procedure: FLEXIBLE SIGMOIDOSCOPY;  Surgeon: Rogene Houston, MD;  [facility];  Service: Endoscopy;  Laterality: N/A;  [place] N/A 10/06/2019   Procedure: FLEXIBLE SIGMOIDOSCOPY;  Surgeon: Rogene Houston, MD;  [facility];  Service: Endoscopy;  Laterality: N/A;  1245   HEMICOLECTOMY  2001   right   HEMICOLECTOMY  2002   left   HOLMIUM LASER APPLICATION Left 0/81/4481   Procedure: HOLMIUM LASER LITHOTRIPSY,;  Surgeon: Franchot Gallo, MD;  Location: [facility];  Service: Urology;  Laterality: Left;   HOLMIUM LASER APPLICATION Left 8/56/3149   Procedure: HOLMIUM LASER APPLICATION;  Surgeon: Franchot Gallo, MD;  Location: [facility];  Service: Urology;  Laterality: Left;   IR RADIOLOGIST EVAL & MGMT  04/29/2018   IR RADIOLOGIST EVAL & MGMT  10/07/2019   IR RADIOLOGIST EVAL & MGMT  10/11/2020   kidney resection  2007   left partial   LAPAROSCOPIC ASSISTED VAGINAL HYSTERECTOMY  1993   W/ BILATERAL SALPINGO-OOPHORECTOMY   LEFT FLANK EXPLORATION W/  PARTIAL LEFT NEPHRECTOMY/ LEFT HILAR LYMPH NODE BX  04-17-2005  DR Machael Raine   RENAL CELL CARCINOMA   LITHOTRIPSY  11-22013   x2   NEPHROLITHOTOMY Left 06/09/2014   Procedure: NEPHROLITHOTOMY PERCUTANEOUS;  Surgeon: Franchot Gallo, MD;  [facility];  Service: Urology;  Laterality: Left;  with STENT   RADIOLOGY WITH  ANESTHESIA Left 05/20/2018   Procedure: CT WITH ANESTHESIA LEFT RENAL CRYOABLATION AND BIOPSY;  Surgeon: Greggory Keen, MD;  [facility];  Service: Anesthesiology;  Laterality: Left;   RIGHT ROTATOR  CUFF REPAIR/   BICEP REPAIR  10-17-2011  DR SUPPLE   SUBTOTAL COLECTOMY  2007   URETEROSCOPY WITH HOLMIUM LASER LITHOTRIPSY Left 07/14/2014   Procedure: URETEROSCOPY  EXTRACTION OF STONES;  Surgeon: Franchot Gallo, MD;  Location: [facility];  Service: Urology;  Laterality: Left;    Home Medications:  (Not in a hospital admission)   Allergies:  Allergies  Allergen Reactions   Latex Rash   Morphine And Related Other (See Comments)    hallucinations   Tape Rash    Certain tapes     Family History  Problem Relation Age of Onset   Cancer Son     Social History:  reports that she has never smoked. She has never used smokeless tobacco. She reports current alcohol use. She reports that she does not use drugs.  ROS: A complete review of systems was  performed.  All systems are negative except for pertinent findings as noted.  Physical Exam:  Vital signs in last 24 hours: @VSRANGES @ General:  Alert and oriented, No acute distress HEENT: Normocephalic, atraumatic Neck: No JVD or lymphadenopathy Cardiovascular: Regular rate   Extremities: No edema Neurologic: Grossly intact  I have reviewed prior pt notes  I have reviewed notes from referring/previous physicians  I have reviewed urinalysis results  I have independently reviewed prior imaging    Impression/Assessment:  1.  History of renal cell carcinoma of the left kidney, status post partial nephrectomy and cryoablation of a recurrence, doing well  2.  History of recurrent urolithiasis of the left side, status post recent ureteroscopic management of a large branched lower calyceal stone.  No KUB done yet for follow-up  Plan:  1.  I gave her prescription for potassiums citrate which she will continue to  take 3 times a day, 15 mill equivalent tablets  2.  KUB today  3.  Office visit 3 to 6 months with KUB  Lillette Boxer Frederika Hukill 02/27/2021, 8:27 AM  Lillette Boxer. Yuto Cajuste MD

## 2021-03-06 ENCOUNTER — Telehealth: Payer: Self-pay

## 2021-03-06 NOTE — Telephone Encounter (Signed)
Vm was left asking for patient to call back regarding message.

## 2021-03-06 NOTE — Telephone Encounter (Signed)
-----   Message from Franchot Gallo, MD sent at 03/06/2021  8:23 AM EST ----- Notify patient-the vast majority of the stone is gone.  A couple of small areas in the lower part of the kidney that I could not access with the scope remain.  However, it looks good.  Follow-up as planned. ----- Message ----- From: Clearence Cheek, CMA Sent: 03/01/2021   8:12 AM EST To: Franchot Gallo, MD  Please review

## 2021-04-23 ENCOUNTER — Other Ambulatory Visit: Payer: Self-pay

## 2021-04-23 ENCOUNTER — Inpatient Hospital Stay (HOSPITAL_COMMUNITY): Payer: PPO | Attending: Hematology

## 2021-04-23 DIAGNOSIS — M85852 Other specified disorders of bone density and structure, left thigh: Secondary | ICD-10-CM | POA: Diagnosis not present

## 2021-04-23 DIAGNOSIS — E538 Deficiency of other specified B group vitamins: Secondary | ICD-10-CM | POA: Insufficient documentation

## 2021-04-23 DIAGNOSIS — C189 Malignant neoplasm of colon, unspecified: Secondary | ICD-10-CM | POA: Diagnosis not present

## 2021-04-23 DIAGNOSIS — C8290 Follicular lymphoma, unspecified, unspecified site: Secondary | ICD-10-CM | POA: Diagnosis not present

## 2021-04-23 DIAGNOSIS — R590 Localized enlarged lymph nodes: Secondary | ICD-10-CM | POA: Insufficient documentation

## 2021-04-23 DIAGNOSIS — M85851 Other specified disorders of bone density and structure, right thigh: Secondary | ICD-10-CM | POA: Insufficient documentation

## 2021-04-23 DIAGNOSIS — Z90710 Acquired absence of both cervix and uterus: Secondary | ICD-10-CM | POA: Insufficient documentation

## 2021-04-23 DIAGNOSIS — R160 Hepatomegaly, not elsewhere classified: Secondary | ICD-10-CM | POA: Insufficient documentation

## 2021-04-23 DIAGNOSIS — C642 Malignant neoplasm of left kidney, except renal pelvis: Secondary | ICD-10-CM | POA: Insufficient documentation

## 2021-04-23 DIAGNOSIS — N200 Calculus of kidney: Secondary | ICD-10-CM | POA: Insufficient documentation

## 2021-04-23 DIAGNOSIS — Z905 Acquired absence of kidney: Secondary | ICD-10-CM | POA: Insufficient documentation

## 2021-04-23 DIAGNOSIS — M8580 Other specified disorders of bone density and structure, unspecified site: Secondary | ICD-10-CM | POA: Insufficient documentation

## 2021-04-23 DIAGNOSIS — D649 Anemia, unspecified: Secondary | ICD-10-CM

## 2021-04-23 LAB — COMPREHENSIVE METABOLIC PANEL
ALT: 15 U/L (ref 0–44)
AST: 16 U/L (ref 15–41)
Albumin: 3.6 g/dL (ref 3.5–5.0)
Alkaline Phosphatase: 66 U/L (ref 38–126)
Anion gap: 9 (ref 5–15)
BUN: 25 mg/dL — ABNORMAL HIGH (ref 8–23)
CO2: 27 mmol/L (ref 22–32)
Calcium: 8.9 mg/dL (ref 8.9–10.3)
Chloride: 102 mmol/L (ref 98–111)
Creatinine, Ser: 0.84 mg/dL (ref 0.44–1.00)
GFR, Estimated: >60 mL/min (ref >60)
Glucose, Bld: 101 mg/dL — ABNORMAL HIGH (ref 70–99)
Potassium: 4.3 mmol/L (ref 3.5–5.1)
Sodium: 138 mmol/L (ref 135–145)
Total Bilirubin: 0.2 mg/dL — ABNORMAL LOW (ref 0.3–1.2)
Total Protein: 6.3 g/dL — ABNORMAL LOW (ref 6.5–8.1)

## 2021-04-23 LAB — CBC WITH DIFFERENTIAL/PLATELET
Abs Immature Granulocytes: 0.03 10*3/uL (ref 0.00–0.07)
Basophils Absolute: 0.1 10*3/uL (ref 0.0–0.1)
Basophils Relative: 1 %
Eosinophils Absolute: 0.3 10*3/uL (ref 0.0–0.5)
Eosinophils Relative: 4 %
HCT: 38.5 % (ref 36.0–46.0)
Hemoglobin: 12.4 g/dL (ref 12.0–15.0)
Immature Granulocytes: 1 %
Lymphocytes Relative: 17 %
Lymphs Abs: 1.1 10*3/uL (ref 0.7–4.0)
MCH: 30 pg (ref 26.0–34.0)
MCHC: 32.2 g/dL (ref 30.0–36.0)
MCV: 93 fL (ref 80.0–100.0)
Monocytes Absolute: 0.8 10*3/uL (ref 0.1–1.0)
Monocytes Relative: 12 %
Neutro Abs: 4.3 10*3/uL (ref 1.7–7.7)
Neutrophils Relative %: 65 %
Platelets: 158 10*3/uL (ref 150–400)
RBC: 4.14 MIL/uL (ref 3.87–5.11)
RDW: 12.8 % (ref 11.5–15.5)
WBC: 6.5 10*3/uL (ref 4.0–10.5)
nRBC: 0 % (ref 0.0–0.2)

## 2021-04-23 LAB — LACTATE DEHYDROGENASE: LDH: 119 U/L (ref 98–192)

## 2021-04-23 LAB — VITAMIN B12: Vitamin B-12: 241 pg/mL (ref 180–914)

## 2021-04-23 LAB — VITAMIN D 25 HYDROXY (VIT D DEFICIENCY, FRACTURES): Vit D, 25-Hydroxy: 56.72 ng/mL (ref 30–100)

## 2021-04-24 LAB — CEA: CEA: 2.5 ng/mL (ref 0.0–4.7)

## 2021-04-30 ENCOUNTER — Inpatient Hospital Stay (HOSPITAL_COMMUNITY): Payer: PPO | Admitting: Hematology

## 2021-04-30 ENCOUNTER — Other Ambulatory Visit: Payer: Self-pay

## 2021-04-30 VITALS — BP 187/80 | HR 85 | Temp 98.6°F | Resp 16 | Ht 65.5 in | Wt 180.8 lb

## 2021-04-30 DIAGNOSIS — C642 Malignant neoplasm of left kidney, except renal pelvis: Secondary | ICD-10-CM

## 2021-04-30 NOTE — Patient Instructions (Addendum)
New Site at [facility] Discharge Instructions   You were seen and examined today by Dr. Delton Coombes.  He reviewed your lab work with you which is normal/stable.  Continue B12 every 3 weeks.  We will arrange a repeat scan and follow up in 6 months.      Thank you for choosing [place] at [facility] to provide your oncology and hematology care.  To afford each patient quality time with our provider, please arrive at least 15 minutes before your scheduled appointment time.   If you have a lab appointment with the [facility] please come in thru the Main Entrance and check in at the main information desk.  You need to re-schedule your appointment should you arrive 10 or more minutes late.  We strive to give you quality time with our providers, and arriving late affects you and other patients whose appointments are after yours.  Also, if you no show three or more times for appointments you may be dismissed from the clinic at the providers discretion.     Again, thank you for choosing [facility].  Our hope is that these requests will decrease the amount of time that you wait before being seen by our physicians.       _____________________________________________________________  Should you have questions after your visit to [facility], please contact our office at [phone] and follow the prompts.  Our office hours are 8:00 a.m. and 4:30 p.m. Monday - Friday.  Please note that voicemails left after 4:00 p.m. may not be returned until the following business day.  We are closed weekends and major holidays.  You do have access to a nurse 24-7, just call the main number to the clinic [phone] and do not press any options, hold on the line and a nurse will answer the phone.    For prescription refill requests, have your pharmacy contact our office and allow 72 hours.    Due to Covid, you will need to wear a mask  upon entering the hospital. If you do not have a mask, a mask will be given to you at the Main Entrance upon arrival. For doctor visits, patients may have 1 support person age 40 or older with them. For treatment visits, patients can not have anyone with them due to social distancing guidelines and our immunocompromised population.

## 2021-04-30 NOTE — Progress Notes (Signed)
Nicole [name], [place] [address]   CLINIC:  Medical Oncology/Hematology  PCP:  Lemmie Evens, MD [address] / [place] [id number] [phone]   REASON FOR VISIT:  Follow-up for left renal cell carcinoma  PRIOR THERAPY: Left partial nephrectomy on 04/17/2005  NGS Results: not done  CURRENT THERAPY: surveillance  BRIEF ONCOLOGIC HISTORY:  Oncology History   No history exists.    CANCER STAGING:  Cancer Staging  No matching staging information was found for the patient.  INTERVAL HISTORY:  [name], a 71 y.o. female, returns for routine follow-up of her left renal cell carcinoma. [place]ia was last seen on 04/06/2020.   Today she reports feeling good. She denies fevers, unintentional weight loss, night sweats, bleeding, and black stools. She reports trying to lose weight.   REVIEW OF SYSTEMS:  Review of Systems  Constitutional:  Negative for appetite change, fatigue, fever and unexpected weight change.  HENT:   Negative for nosebleeds.   Cardiovascular:  Positive for palpitations.  Gastrointestinal:  Negative for blood in stool.  Endocrine: Negative for hot flashes.  Genitourinary:  Negative for hematuria and vaginal bleeding.   Neurological:  Positive for dizziness and numbness.  Hematological:  Does not bruise/bleed easily.  All other systems reviewed and are negative.  PAST MEDICAL/SURGICAL HISTORY:  Past Medical History:  Diagnosis Date   Arthritis    shorulder   B12 deficiency    Chronic diarrhea SECONDARY HX COLON CANCER   History of colon cancer [facility] SYNDROME ---  COLORECTAL CANCER S/P COLECTOMY X3  LAST ONE 2007   NO RECURRENCE   History of colon cancer 05/04/2013   History of kidney infection 08/2013   History of kidney stones    History of MRSA infection 08/2013   left leg   Hx of osteopenia    Lynch syndrome 10/17/2010   Hereditary colon cancer   NHL (non-Hodgkin's lymphoma) ([place]) 10/17/2010    MONITORED BY DR Tressie Stalker   Personal history of renal cancer S/P LEFT PARTIAL NEPHRECTOMY 2007--  NO RECURRENCE   DUE TO KIDNEY CANCER   Right shoulder pain S/P ROTATOR CUFF REPAIR   Past Surgical History:  Procedure Laterality Date   ABDOMINAL HYSTERECTOMY     APPENDECTOMY  1993   W/ LAVH   COLONOSCOPY W/ POLYPECTOMY     CYSTOSCOPY W/ RETROGRADES  07/14/2014   Procedure: CYSTOSCOPY WITH RETROGRADE PYELOGRAM;  Surgeon: Franchot Gallo, MD;  Location: [facility];  Service: Urology;;   CYSTOSCOPY W/ URETERAL STENT REMOVAL Left 07/14/2014   Procedure: CYSTOSCOPY WITH STENT REMOVAL;  Surgeon: Franchot Gallo, MD;  Location: [facility];  Service: Urology;  Laterality: Left;   CYSTOSCOPY WITH RETROGRADE PYELOGRAM, URETEROSCOPY AND STENT PLACEMENT Left 11/30/2020   Procedure: CYSTOSCOPY WITH RETROGRADE PYELOGRAM, URETEROSCOPY/STONE EXTRACTION AND STENT PLACEMENT;  Surgeon: Franchot Gallo, MD;  Location: [facility];  Service: Urology;  Laterality: Left;   CYSTOSCOPY WITH STENT PLACEMENT  02/07/2012   Procedure: CYSTOSCOPY WITH STENT PLACEMENT;  Surgeon: Franchot Gallo, MD;  Location: [facility];  Service: Urology;  Laterality: Left;   CYSTOSCOPY WITH STENT PLACEMENT Left 07/14/2014   Procedure: CYSTOSCOPY WITH STENT PLACEMENT;  Surgeon: Franchot Gallo, MD;  Location: [facility];  Service: Urology;  Laterality: Left;   FLEXIBLE SIGMOIDOSCOPY  08/29/2011   Procedure: FLEXIBLE SIGMOIDOSCOPY;  Surgeon: Rogene Houston, MD;  [facility];  Service: Endoscopy;  Laterality: N/A;  [place] 09/28/2012   Procedure: FLEXIBLE SIGMOIDOSCOPY;  Surgeon: Rogene Houston, MD;  [facility];  Service: Endoscopy;  Laterality: N/A;  [place] N/A 07/08/2014   Procedure: FLEXIBLE SIGMOIDOSCOPY;  Surgeon: Rogene Houston, MD;  [facility];  Service:  Endoscopy;  Laterality: N/A;  830 - moved to 10:20 - Ann to notify pt   FLEXIBLE SIGMOIDOSCOPY N/A 08/11/2015   Procedure: FLEXIBLE SIGMOIDOSCOPY;  Surgeon: Rogene Houston, MD;  [facility];  Service: Endoscopy;  Laterality: N/A;  [place] N/A 08/13/2016   Procedure: FLEXIBLE SIGMOIDOSCOPY;  Surgeon: Rogene Houston, MD;  [facility];  Service: Endoscopy;  Laterality: N/A;  [place] N/A 10/06/2019   Procedure: FLEXIBLE SIGMOIDOSCOPY;  Surgeon: Rogene Houston, MD;  [facility];  Service: Endoscopy;  Laterality: N/A;  1245   HEMICOLECTOMY  2001   right   HEMICOLECTOMY  2002   left   HOLMIUM LASER APPLICATION Left 5/46/5681   Procedure: HOLMIUM LASER LITHOTRIPSY,;  Surgeon: Franchot Gallo, MD;  Location[facility];  Service: Urology;  Laterality: Left;   HOLMIUM LASER APPLICATION Left 2/75/1700   Procedure: HOLMIUM LASER APPLICATION;  Surgeon: Franchot Gallo, MD;  Location: [facility];  Service: Urology;  Laterality: Left;   IR RADIOLOGIST EVAL & MGMT  04/29/2018   IR RADIOLOGIST EVAL & MGMT  10/07/2019   IR RADIOLOGIST EVAL & MGMT  10/11/2020   kidney resection  2007   left partial   LAPAROSCOPIC ASSISTED VAGINAL HYSTERECTOMY  1993   W/ BILATERAL SALPINGO-OOPHORECTOMY   LEFT FLANK EXPLORATION W/  PARTIAL LEFT NEPHRECTOMY/ LEFT HILAR LYMPH NODE BX  04-17-2005  DR DAHLSTEDT   RENAL CELL CARCINOMA   LITHOTRIPSY  11-22013   x2   NEPHROLITHOTOMY Left 06/09/2014   Procedure: NEPHROLITHOTOMY PERCUTANEOUS;  Surgeon: Franchot Gallo, MD;  [facility];  Service: Urology;  Laterality: Left;  with STENT   RADIOLOGY WITH ANESTHESIA Left 05/20/2018   Procedure: CT WITH ANESTHESIA LEFT RENAL CRYOABLATION AND BIOPSY;  Surgeon: Greggory Keen, MD;  [facility];  Service: Anesthesiology;  Laterality: Left;   RIGHT ROTATOR  CUFF REPAIR/   BICEP REPAIR  10-17-2011  DR SUPPLE   SUBTOTAL  COLECTOMY  2007   URETEROSCOPY WITH HOLMIUM LASER LITHOTRIPSY Left 07/14/2014   Procedure: URETEROSCOPY  EXTRACTION OF STONES;  Surgeon: Franchot Gallo, MD;  Location: [facility];  Service: Urology;  Laterality: Left;    SOCIAL HISTORY:  Social History   Socioeconomic History   Marital status: Divorced    Spouse name: Not on file   Number of children: Not on file   Years of education: Not on file   Highest education level: Not on file  Occupational History   Not on file  Tobacco Use   Smoking status: Never   Smokeless tobacco: Never  Vaping Use   Vaping Use: Never used  Substance and Sexual Activity   Alcohol use: Yes    Comment: 2 glasses of wine per year - maybe   Drug use: No   Sexual activity: Not Currently  Other Topics Concern   Not on file  Social History Narrative   Not on file   Social Determinants of Health   Financial Resource Strain: Not on file  Food Insecurity: Not on file  Transportation Needs: Not on file  Physical Activity: Not on file  Stress: Not on file  Social Connections: Not on file  Intimate Partner Violence: Not on file    FAMILY HISTORY:  Family History  Problem Relation Age of Onset   Cancer Son     CURRENT MEDICATIONS:  Current Outpatient Medications  Medication Sig Dispense Refill   Cholecalciferol (VITAMIN D3) 125 MCG (5000 UT) CAPS Take 5,000 Units by mouth daily.     cyanocobalamin (,VITAMIN B-12,) 1000 MCG/ML injection ADMINISTER 1 ML(1000 MCG) IN THE MUSCLE EVERY 21 DAYS 1 mL 17   diphenoxylate-atropine (LOMOTIL) 2.5-0.025 MG tablet Take 1 tablet by mouth 4 (four) times daily as needed for diarrhea or loose stools.     furosemide (LASIX) 40 MG tablet Take 40 mg by mouth daily as needed for fluid.      ibuprofen (ADVIL,MOTRIN) 800 MG tablet Take 800 mg by mouth 3 (three) times daily as needed for moderate pain.  11   milk thistle 175 MG tablet Take 1,000 mg by mouth daily.     phentermine 37.5 MG capsule Take  37.5 mg by mouth daily.     Potassium Citrate 15 MEQ (1620 MG) TBCR Take 1 tablet by mouth 3 (three) times daily. 270 tablet 0   No current facility-administered medications for this visit.    ALLERGIES:  Allergies  Allergen Reactions   Latex Rash   Morphine And Related Other (See Comments)    hallucinations   Tape Rash    Certain tapes     PHYSICAL EXAM:  Performance status (ECOG): 1 - Symptomatic but completely ambulatory  Vitals:   04/30/21 0809  BP: (!) 187/80  Pulse: 85  Resp: 16  Temp: 98.6 F (37 C)  SpO2: 100%   Wt Readings from Last 3 Encounters:  04/30/21 180 lb 12.4 oz (82 kg)  11/30/20 186 lb 6.4 oz (84.6 kg)  04/06/20 212 lb 6.4 oz (96.3 kg)   Physical Exam Vitals reviewed.  Constitutional:      Appearance: Normal appearance.  Cardiovascular:     Rate and Rhythm: Normal rate and regular rhythm.     Pulses: Normal pulses.     Heart sounds: Normal heart sounds.  Pulmonary:     Effort: Pulmonary effort is normal.     Breath sounds: Normal breath sounds.  Abdominal:     Palpations: Abdomen is soft. There is no hepatomegaly, splenomegaly or mass.     Tenderness: There is no abdominal tenderness.  Musculoskeletal:     Right lower leg: No edema.     Left lower leg: No edema.  Lymphadenopathy:     Cervical: No cervical adenopathy.     Right cervical: No superficial cervical adenopathy.    Left cervical: No superficial cervical adenopathy.     Upper Body:     Right upper body: No supraclavicular or axillary adenopathy.     Left upper body: No supraclavicular or axillary adenopathy.  Neurological:     General: No focal deficit present.     Mental Status: She is alert and oriented to person, place, and time.  Psychiatric:        Mood and Affect: Mood normal.        Behavior: Behavior normal.     LABORATORY DATA:  I have reviewed the labs as listed.  CBC Latest Ref Rng & Units 04/23/2021 11/30/2020 10/03/2020  WBC 4.0 - 10.5 K/uL 6.5 - 7.2  Hemoglobin  12.0 - 15.0 g/dL 12.4 12.6 13.8  Hematocrit 36.0 - 46.0 % 38.5 37.0 42.7  Platelets 150 - 400 K/uL 158 - 174   CMP Latest Ref Rng & Units 04/23/2021 11/30/2020 10/03/2020  Glucose 70 - 99 mg/dL 101(H) 105(H) 113(H)  BUN 8 - 23 mg/dL 25(H) 19 27(H)  Creatinine 0.44 - 1.00 mg/dL 0.84 0.70 0.94  Sodium 135 - 145 mmol/L 138 142 137  Potassium 3.5 - 5.1 mmol/L 4.3 4.0 4.5  Chloride 98 - 111 mmol/L 102 103 101  CO2 22 - 32 mmol/L 27 - 26  Calcium 8.9 - 10.3 mg/dL 8.9 - 9.4  Total Protein 6.5 - 8.1 g/dL 6.3(L) - 7.0  Total Bilirubin 0.3 - 1.2 mg/dL 0.2(L) - 0.6  Alkaline Phos 38 - 126 U/L 66 - 63  AST 15 - 41 U/L 16 - 19  ALT 0 - 44 U/L 15 - 21    DIAGNOSTIC IMAGING:  I have independently reviewed the scans and discussed with the patient. No results found.   ASSESSMENT:  1.  Left renal cell carcinoma: -Left partial nephrectomy on 04/17/2005, stage T1 a clear-cell renal cell carcinoma, Fuhrman nuclear grade 3/4, tumor size 1.9 cm. -MRI of the abdomen on 04/21/2018 showed left upper pole renal lesion suspicious for recurrent renal cell cancer near the partial nephrectomy site.  Stable retrocrural and retroperitoneal lymphadenopathy. -Left renal mass biopsy and cryoablation on 05/20/2018, biopsy consistent with clear-cell renal cell carcinoma. -CT abdomen with and without contrast on 09/22/2019 shows left partial nephrectomy with stable appearance of the cryoablation defect in the upper pole of the left kidney with no new or progressive findings.  Bilateral renal stones stable.  Stable retroperitoneal adenopathy.  Hepatomegaly with steatosis.   2.  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 -TAH and BSO in 1993. -Flex sigmoidoscopy on 08/13/2016 with normal ileum, end-to-end ileocolonic anastomosis, normal rectosigmoid colon, normal rectum. - Last flexible sigmoidoscopy on 10/06/2019, normal.   3.  Colon cancer: -Subtotal colectomy with ileorectal anastomosis.  No change in bowel habits.   4.   Follicular lymphoma: -Retroperitoneal adenopathy is stable.  No B symptoms.   PLAN:  1.  Left renal cell carcinoma: - Last CT abdomen on 03/30/2020 showed stable findings of the left kidney. - I plan to repeat CTAP with contrast in 6 months.   2.  Lynch syndrome: - She will follow-up with Dr. Corbin Ade for another sigmoidoscopy.   3.  Colon cancer: - CEA level was 2.5.  Last CT did not show any evidence of metastatic disease.   4.  Follicular lymphoma: - No B symptoms. - Retroperitoneal lymphadenopathy stable on scan from 03/30/2020.   5.  B12 deficiency: - She is continuing B12 injections every 3 weeks.  B12 level is 241.   Orders placed this encounter:  No orders of the defined types were placed in this encounter.    Derek Jack, MD [place] [phone]   I, Thana Ates, am acting as a scribe for Dr. Derek Jack.  I, Derek Jack MD, have reviewed the above documentation for accuracy and completeness, and I agree with the above.

## 2021-08-28 ENCOUNTER — Ambulatory Visit: Payer: PPO | Admitting: Urology

## 2021-08-30 ENCOUNTER — Other Ambulatory Visit: Payer: Self-pay | Admitting: Interventional Radiology

## 2021-08-30 DIAGNOSIS — N2889 Other specified disorders of kidney and ureter: Secondary | ICD-10-CM

## 2021-09-05 ENCOUNTER — Encounter (INDEPENDENT_AMBULATORY_CARE_PROVIDER_SITE_OTHER): Payer: Self-pay | Admitting: *Deleted

## 2021-09-06 ENCOUNTER — Other Ambulatory Visit (HOSPITAL_COMMUNITY): Payer: Self-pay | Admitting: Family Medicine

## 2021-09-06 DIAGNOSIS — Z1231 Encounter for screening mammogram for malignant neoplasm of breast: Secondary | ICD-10-CM

## 2021-09-10 ENCOUNTER — Ambulatory Visit (HOSPITAL_COMMUNITY)
Admission: RE | Admit: 2021-09-10 | Discharge: 2021-09-10 | Disposition: A | Discharge status: Home / Self Care | Payer: PPO | Source: Ambulatory Visit | Attending: Urology | Admitting: Urology

## 2021-09-10 ENCOUNTER — Ambulatory Visit (HOSPITAL_COMMUNITY)
Admission: RE | Admit: 2021-09-10 | Discharge: 2021-09-10 | Disposition: A | Discharge status: Home / Self Care | Payer: PPO | Source: Ambulatory Visit | Attending: Family Medicine | Admitting: Family Medicine

## 2021-09-10 DIAGNOSIS — N200 Calculus of kidney: Secondary | ICD-10-CM

## 2021-09-10 DIAGNOSIS — Z1231 Encounter for screening mammogram for malignant neoplasm of breast: Secondary | ICD-10-CM | POA: Insufficient documentation

## 2021-09-11 ENCOUNTER — Encounter: Payer: Self-pay | Admitting: Urology

## 2021-09-11 ENCOUNTER — Ambulatory Visit: Payer: PPO | Admitting: Urology

## 2021-09-11 VITALS — BP 184/83 | HR 77

## 2021-09-11 DIAGNOSIS — N390 Urinary tract infection, site not specified: Secondary | ICD-10-CM

## 2021-09-11 DIAGNOSIS — N200 Calculus of kidney: Secondary | ICD-10-CM | POA: Diagnosis not present

## 2021-09-11 DIAGNOSIS — Z85528 Personal history of other malignant neoplasm of kidney: Secondary | ICD-10-CM | POA: Diagnosis not present

## 2021-09-11 NOTE — Progress Notes (Signed)
History of Present Illness:   History of urolithiasis: She underwent left PCNL March, 2016 followed by left-sided ureteroscopy with laser and extraction of residual left renal calculi in April 16. 24-hour urine showed low magnesium and citrate level. Stone analysis revealed carbonate apatite.  9.15.2022: She underwent ureteroscopic management of large left lower pole branch stone with holmium laser.  Stent removed 3 weeks later   She is on potassium citrate 15 mEq tid--most of the time she just takes it twice a day, however.  12.13.2022: Abdominal x-ray revealed 8 and 12 mm left lower pole calcifications.  6.27.2023: She has had no recent gross hematuria.  Recent KUB performed revealed stable appearance of left lower pole calcifications.  She is still on potassiums citrate 15 mEq 3 times a day.     History of renal cell carcinoma: She is status post left partial nephrectomy 1.31.2007 for renal cell carcinoma. Final pathology revealed stage T1a clear cell carcinoma, Fuhrman nuclear grade 3/4. Tumor size was 1.9 cm.   CT A/P 1/18 revealed no evidence of recurrence.   1.6.2020: CT scan revealed enhancing 19 mm left upper pole renal mass consistent with recurrent renal cell carcinoma. There is stable retroperitoneal/peri aortic adenopathy. This was treated w/ cryoablation on 3.4.2020.   6.28.2022: She is followed by Dr. Ellin Saba.  She saw him in January of this year and got a clean bill of health.  She does have Lynch syndrome.   6.27.2023: She will be having repeat CT in the near future. .  Past Medical History:  Diagnosis Date   Arthritis    shorulder   B12 deficiency    Chronic diarrhea SECONDARY HX COLON CANCER   History of colon cancer [facility] SYNDROME ---  COLORECTAL CANCER S/P COLECTOMY X3  LAST ONE 2007   NO RECURRENCE   History of colon cancer 05/04/2013   History of kidney infection 08/2013   History of kidney stones    History of MRSA infection 08/2013   left leg   Hx of  osteopenia    Lynch syndrome 10/17/2010   Hereditary colon cancer   NHL (non-Hodgkin's lymphoma) (HCC) 10/17/2010   MONITORED BY DR Mariel Sleet   Personal history of renal cancer S/P LEFT PARTIAL NEPHRECTOMY 2007--  NO RECURRENCE   DUE TO KIDNEY CANCER   Right shoulder pain S/P ROTATOR CUFF REPAIR    Past Surgical History:  Procedure Laterality Date   ABDOMINAL HYSTERECTOMY     APPENDECTOMY  1993   W/ LAVH   COLONOSCOPY W/ POLYPECTOMY     CYSTOSCOPY W/ RETROGRADES  07/14/2014   Procedure: CYSTOSCOPY WITH RETROGRADE PYELOGRAM;  Surgeon: Marcine Matar, MD;  Location: [facility];  Service: Urology;;   CYSTOSCOPY W/ URETERAL STENT REMOVAL Left 07/14/2014   Procedure: CYSTOSCOPY WITH STENT REMOVAL;  Surgeon: Marcine Matar, MD;  Location: [facility];  Service: Urology;  Laterality: Left;   CYSTOSCOPY WITH RETROGRADE PYELOGRAM, URETEROSCOPY AND STENT PLACEMENT Left 11/30/2020   Procedure: CYSTOSCOPY WITH RETROGRADE PYELOGRAM, URETEROSCOPY/STONE EXTRACTION AND STENT PLACEMENT;  Surgeon: Marcine Matar, MD;  Location: [facility];  Service: Urology;  Laterality: Left;   CYSTOSCOPY WITH STENT PLACEMENT  02/07/2012   Procedure: CYSTOSCOPY WITH STENT PLACEMENT;  Surgeon: Marcine Matar, MD;  Location: [facility];  Service: Urology;  Laterality: Left;   CYSTOSCOPY WITH STENT PLACEMENT Left 07/14/2014   Procedure: CYSTOSCOPY WITH STENT PLACEMENT;  Surgeon: Marcine Matar, MD;  Location: [facility];  Service: Urology;  Laterality: Left;  FLEXIBLE SIGMOIDOSCOPY  08/29/2011   Procedure: FLEXIBLE SIGMOIDOSCOPY;  Surgeon: Malissa Hippo, MD;  [facility];  Service: Endoscopy;  Laterality: N/A;  930   FLEXIBLE SIGMOIDOSCOPY N/A 09/28/2012   Procedure: FLEXIBLE SIGMOIDOSCOPY;  Surgeon: Malissa Hippo, MD;  [facility];  Service: Endoscopy;  Laterality: N/A;  730   FLEXIBLE SIGMOIDOSCOPY N/A  07/08/2014   Procedure: FLEXIBLE SIGMOIDOSCOPY;  Surgeon: Malissa Hippo, MD;  [facility];  Service: Endoscopy;  Laterality: N/A;  830 - moved to 10:20 - Ann to notify pt   FLEXIBLE SIGMOIDOSCOPY N/A 08/11/2015   Procedure: FLEXIBLE SIGMOIDOSCOPY;  Surgeon: Malissa Hippo, MD;  [facility];  Service: Endoscopy;  Laterality: N/A;  730   FLEXIBLE SIGMOIDOSCOPY N/A 08/13/2016   Procedure: FLEXIBLE SIGMOIDOSCOPY;  Surgeon: Malissa Hippo, MD;  [facility];  Service: Endoscopy;  Laterality: N/A;  730   FLEXIBLE SIGMOIDOSCOPY N/A 10/06/2019   Procedure: FLEXIBLE SIGMOIDOSCOPY;  Surgeon: Malissa Hippo, MD;  [facility];  Service: Endoscopy;  Laterality: N/A;  1245   HEMICOLECTOMY  2001   right   HEMICOLECTOMY  2002   left   HOLMIUM LASER APPLICATION Left 07/14/2014   Procedure: HOLMIUM LASER LITHOTRIPSY,;  Surgeon: Marcine Matar, MD;  Location: [facility];  Service: Urology;  Laterality: Left;   HOLMIUM LASER APPLICATION Left 11/30/2020   Procedure: HOLMIUM LASER APPLICATION;  Surgeon: Marcine Matar, MD;  Location: [facility];  Service: Urology;  Laterality: Left;   IR RADIOLOGIST EVAL & MGMT  04/29/2018   IR RADIOLOGIST EVAL & MGMT  10/07/2019   IR RADIOLOGIST EVAL & MGMT  10/11/2020   kidney resection  2007   left partial   LAPAROSCOPIC ASSISTED VAGINAL HYSTERECTOMY  1993   W/ BILATERAL SALPINGO-OOPHORECTOMY   LEFT FLANK EXPLORATION W/  PARTIAL LEFT NEPHRECTOMY/ LEFT HILAR LYMPH NODE BX  04-17-2005  DR Elveta Rape   RENAL CELL CARCINOMA   LITHOTRIPSY  11-22013   x2   NEPHROLITHOTOMY Left 06/09/2014   Procedure: NEPHROLITHOTOMY PERCUTANEOUS;  Surgeon: Marcine Matar, MD;  Location: [facility];  Service: Urology;  Laterality: Left;  with STENT   RADIOLOGY WITH ANESTHESIA Left 05/20/2018   Procedure: CT WITH ANESTHESIA LEFT RENAL CRYOABLATION AND BIOPSY;  Surgeon: Berdine Dance, MD;  Location: [facility];  Service:  Anesthesiology;  Laterality: Left;   RIGHT ROTATOR  CUFF REPAIR/   BICEP REPAIR  10-17-2011  DR SUPPLE   SUBTOTAL COLECTOMY  2007   URETEROSCOPY WITH HOLMIUM LASER LITHOTRIPSY Left 07/14/2014   Procedure: URETEROSCOPY  EXTRACTION OF STONES;  Surgeon: Marcine Matar, MD;  Location: [facility];  Service: Urology;  Laterality: Left;    Home Medications:  (Not in a hospital admission)   Allergies:  Allergies  Allergen Reactions   Latex Rash   Morphine And Related Other (See Comments)    hallucinations   Tape Rash    Certain tapes     Family History  Problem Relation Age of Onset   Cancer Son     Social History:  reports that she has never smoked. She has never used smokeless tobacco. She reports current alcohol use. She reports that she does not use drugs.  ROS: A complete review of systems was performed.  All systems are negative except for pertinent findings as noted.  Physical Exam:  Vital signs in last 24 hours: @VSRANGES @ General:  Alert and oriented, No acute distress HEENT: Normocephalic, atraumatic Neck:  No JVD or lymphadenopathy Cardiovascular: Regular rate  Lungs: Normal inspiratory/expiratory excursion Extremities: No edema Neurologic: Grossly intact  I have reviewed prior pt notes  I have reviewed notes from referring/previous physicians  I have reviewed urinalysis results  I have independently reviewed prior imaging--prior CT images as well as KUB reviewed    Impression/Assessment:  History of renal cell carcinoma, status post partial nephrectomy 2007 with cryoablation of satellite recurrent in 2020.  No evidence of recurrence recently  History of recurrent large left renal calculi with several treatments, most recently ureteroscopy in September, 2022.  She has stable appearance of stones that have recurred in that area, asymptomatic.  On recent appropriate dosing of potassium citrate  Plan:  1.  I will write her a prescription for  double dose potassium citrate to help save over 3 times a day   2.  I like to see her back in 6 months with KUB  [name] Idaly Verret 09/11/2021, 8:18 AM  [name]. Advit Trethewey MD

## 2021-09-12 ENCOUNTER — Other Ambulatory Visit (HOSPITAL_COMMUNITY): Payer: Self-pay | Admitting: Family Medicine

## 2021-09-12 DIAGNOSIS — R928 Other abnormal and inconclusive findings on diagnostic imaging of breast: Secondary | ICD-10-CM

## 2021-09-12 LAB — URINALYSIS, ROUTINE W REFLEX MICROSCOPIC
Bilirubin, UA: NEGATIVE
Glucose, UA: NEGATIVE
Ketones, UA: NEGATIVE
Leukocytes,UA: NEGATIVE
Nitrite, UA: NEGATIVE
Protein,UA: NEGATIVE
Specific Gravity, UA: 1.02 (ref 1.005–1.030)
Urobilinogen, Ur: 0.2 mg/dL (ref 0.2–1.0)
pH, UA: 5.5 (ref 5.0–7.5)

## 2021-09-12 LAB — MICROSCOPIC EXAMINATION

## 2021-09-19 ENCOUNTER — Other Ambulatory Visit (HOSPITAL_COMMUNITY): Payer: Self-pay | Admitting: *Deleted

## 2021-09-19 DIAGNOSIS — E538 Deficiency of other specified B group vitamins: Secondary | ICD-10-CM

## 2021-09-19 MED ORDER — CYANOCOBALAMIN 1000 MCG/ML IJ SOLN: INTRAMUSCULAR | 17 refills | Status: DC

## 2021-09-25 ENCOUNTER — Ambulatory Visit (HOSPITAL_COMMUNITY)
Admission: RE | Admit: 2021-09-25 | Discharge: 2021-09-25 | Disposition: A | Discharge status: Home / Self Care | Payer: PPO | Source: Ambulatory Visit | Attending: Family Medicine | Admitting: Family Medicine

## 2021-09-25 DIAGNOSIS — R928 Other abnormal and inconclusive findings on diagnostic imaging of breast: Secondary | ICD-10-CM | POA: Diagnosis present

## 2021-09-28 ENCOUNTER — Other Ambulatory Visit: Payer: PPO

## 2021-10-22 ENCOUNTER — Encounter (HOSPITAL_COMMUNITY): Payer: Self-pay | Admitting: Hematology

## 2021-10-29 ENCOUNTER — Inpatient Hospital Stay: Payer: PPO | Attending: Hematology

## 2021-10-29 ENCOUNTER — Other Ambulatory Visit (HOSPITAL_COMMUNITY): Payer: Self-pay | Admitting: Hematology

## 2021-10-29 ENCOUNTER — Ambulatory Visit (HOSPITAL_COMMUNITY)
Admission: RE | Admit: 2021-10-29 | Discharge: 2021-10-29 | Disposition: A | Discharge status: Home / Self Care | Payer: PPO | Source: Ambulatory Visit | Attending: Hematology | Admitting: Hematology

## 2021-10-29 DIAGNOSIS — C642 Malignant neoplasm of left kidney, except renal pelvis: Secondary | ICD-10-CM | POA: Insufficient documentation

## 2021-10-29 DIAGNOSIS — Z8572 Personal history of non-Hodgkin lymphomas: Secondary | ICD-10-CM | POA: Insufficient documentation

## 2021-10-29 DIAGNOSIS — E538 Deficiency of other specified B group vitamins: Secondary | ICD-10-CM | POA: Insufficient documentation

## 2021-10-29 DIAGNOSIS — Z87442 Personal history of urinary calculi: Secondary | ICD-10-CM | POA: Insufficient documentation

## 2021-10-29 DIAGNOSIS — Z905 Acquired absence of kidney: Secondary | ICD-10-CM | POA: Insufficient documentation

## 2021-10-29 DIAGNOSIS — C182 Malignant neoplasm of ascending colon: Secondary | ICD-10-CM | POA: Diagnosis not present

## 2021-10-29 DIAGNOSIS — Z85038 Personal history of other malignant neoplasm of large intestine: Secondary | ICD-10-CM | POA: Insufficient documentation

## 2021-10-29 DIAGNOSIS — M8580 Other specified disorders of bone density and structure, unspecified site: Secondary | ICD-10-CM | POA: Insufficient documentation

## 2021-10-29 DIAGNOSIS — Z1509 Genetic susceptibility to other malignant neoplasm: Secondary | ICD-10-CM | POA: Insufficient documentation

## 2021-10-29 DIAGNOSIS — M545 Low back pain, unspecified: Secondary | ICD-10-CM | POA: Insufficient documentation

## 2021-10-29 DIAGNOSIS — Z79899 Other long term (current) drug therapy: Secondary | ICD-10-CM | POA: Insufficient documentation

## 2021-10-29 DIAGNOSIS — M129 Arthropathy, unspecified: Secondary | ICD-10-CM | POA: Insufficient documentation

## 2021-10-29 LAB — CBC WITH DIFFERENTIAL/PLATELET
Abs Immature Granulocytes: 0.02 10*3/uL (ref 0.00–0.07)
Basophils Absolute: 0.1 10*3/uL (ref 0.0–0.1)
Basophils Relative: 2 %
Eosinophils Absolute: 0.2 10*3/uL (ref 0.0–0.5)
Eosinophils Relative: 4 %
HCT: 40.1 % (ref 36.0–46.0)
Hemoglobin: 13 g/dL (ref 12.0–15.0)
Immature Granulocytes: 0 %
Lymphocytes Relative: 18 %
Lymphs Abs: 1.1 10*3/uL (ref 0.7–4.0)
MCH: 30.1 pg (ref 26.0–34.0)
MCHC: 32.4 g/dL (ref 30.0–36.0)
MCV: 92.8 fL (ref 80.0–100.0)
Monocytes Absolute: 0.6 10*3/uL (ref 0.1–1.0)
Monocytes Relative: 10 %
Neutro Abs: 4 10*3/uL (ref 1.7–7.7)
Neutrophils Relative %: 66 %
Platelets: 150 10*3/uL (ref 150–400)
RBC: 4.32 MIL/uL (ref 3.87–5.11)
RDW: 12.9 % (ref 11.5–15.5)
WBC: 6 10*3/uL (ref 4.0–10.5)
nRBC: 0 % (ref 0.0–0.2)

## 2021-10-29 LAB — COMPREHENSIVE METABOLIC PANEL
ALT: 16 U/L (ref 0–44)
AST: 19 U/L (ref 15–41)
Albumin: 3.7 g/dL (ref 3.5–5.0)
Alkaline Phosphatase: 65 U/L (ref 38–126)
Anion gap: 6 (ref 5–15)
BUN: 23 mg/dL (ref 8–23)
CO2: 27 mmol/L (ref 22–32)
Calcium: 9.1 mg/dL (ref 8.9–10.3)
Chloride: 109 mmol/L (ref 98–111)
Creatinine, Ser: 0.75 mg/dL (ref 0.44–1.00)
GFR, Estimated: >60 mL/min (ref >60)
Glucose, Bld: 103 mg/dL — ABNORMAL HIGH (ref 70–99)
Potassium: 4.5 mmol/L (ref 3.5–5.1)
Sodium: 142 mmol/L (ref 135–145)
Total Bilirubin: 0.4 mg/dL (ref 0.3–1.2)
Total Protein: 6.8 g/dL (ref 6.5–8.1)

## 2021-10-29 LAB — VITAMIN B12: Vitamin B-12: 363 pg/mL (ref 180–914)

## 2021-10-29 LAB — LACTATE DEHYDROGENASE: LDH: 130 U/L (ref 98–192)

## 2021-10-29 MED ORDER — IOHEXOL 300 MG/ML  SOLN
100.0000 mL | Freq: Once | INTRAMUSCULAR | Status: AC | PRN
  Administered 2021-10-29: 100 mL via INTRAVENOUS

## 2021-10-30 LAB — CEA: CEA: 3.3 ng/mL (ref 0.0–4.7)

## 2021-11-05 ENCOUNTER — Inpatient Hospital Stay: Payer: PPO | Admitting: Hematology

## 2021-11-05 VITALS — BP 158/78 | HR 93 | Temp 99.6°F | Resp 18 | Ht 61.5 in | Wt 180.2 lb

## 2021-11-05 DIAGNOSIS — M8580 Other specified disorders of bone density and structure, unspecified site: Secondary | ICD-10-CM | POA: Diagnosis not present

## 2021-11-05 DIAGNOSIS — Z1509 Genetic susceptibility to other malignant neoplasm: Secondary | ICD-10-CM | POA: Diagnosis not present

## 2021-11-05 DIAGNOSIS — M129 Arthropathy, unspecified: Secondary | ICD-10-CM | POA: Diagnosis not present

## 2021-11-05 DIAGNOSIS — Z87442 Personal history of urinary calculi: Secondary | ICD-10-CM | POA: Diagnosis not present

## 2021-11-05 DIAGNOSIS — C182 Malignant neoplasm of ascending colon: Secondary | ICD-10-CM

## 2021-11-05 DIAGNOSIS — E538 Deficiency of other specified B group vitamins: Secondary | ICD-10-CM

## 2021-11-05 DIAGNOSIS — Z85038 Personal history of other malignant neoplasm of large intestine: Secondary | ICD-10-CM | POA: Diagnosis not present

## 2021-11-05 DIAGNOSIS — M545 Low back pain, unspecified: Secondary | ICD-10-CM | POA: Diagnosis not present

## 2021-11-05 DIAGNOSIS — Z79899 Other long term (current) drug therapy: Secondary | ICD-10-CM | POA: Diagnosis not present

## 2021-11-05 DIAGNOSIS — C642 Malignant neoplasm of left kidney, except renal pelvis: Secondary | ICD-10-CM

## 2021-11-05 DIAGNOSIS — Z8572 Personal history of non-Hodgkin lymphomas: Secondary | ICD-10-CM | POA: Diagnosis not present

## 2021-11-05 MED ORDER — DIPHENOXYLATE-ATROPINE 2.5-0.025 MG PO TABS: 1.0000 | ORAL_TABLET | Freq: Four times a day (QID) | ORAL | 6 refills | Status: DC | PRN

## 2021-11-05 NOTE — Progress Notes (Signed)
[address], [place] [address]   CLINIC:  Medical Oncology/Hematology  PCP:  Lemmie Evens, MD [place]. / [place] [address] [phone]   REASON FOR VISIT:  Follow-up for left renal cell carcinoma  PRIOR THERAPY: Left partial nephrectomy on 04/17/2005  NGS Results: not done  CURRENT THERAPY: surveillance  BRIEF ONCOLOGIC HISTORY:  Oncology History   No history exists.    CANCER STAGING:  Cancer Staging  No matching staging information was found for the patient.  INTERVAL HISTORY:  Nicole [name], a 71 y.o. female, seen for follow-up of left kidney cancer.  Energy levels are 75%.  Reports left loin pain 4 out of 10 intensity.  REVIEW OF SYSTEMS:  Review of Systems  Constitutional:  Negative for appetite change, fatigue, fever and unexpected weight change.  HENT:   Negative for nosebleeds.   Respiratory:  Positive for cough.   Cardiovascular:  Negative for palpitations.  Gastrointestinal:  Negative for blood in stool.  Endocrine: Negative for hot flashes.  Genitourinary:  Negative for hematuria and vaginal bleeding.   Neurological:  Positive for numbness. Negative for dizziness.  Hematological:  Does not bruise/bleed easily.  All other systems reviewed and are negative.   PAST MEDICAL/SURGICAL HISTORY:  Past Medical History:  Diagnosis Date   Arthritis    shorulder   B12 deficiency    Chronic diarrhea SECONDARY HX COLON CANCER   History of colon cancer [facility] SYNDROME ---  COLORECTAL CANCER S/P COLECTOMY X3  LAST ONE 2007   NO RECURRENCE   History of colon cancer 05/04/2013   History of kidney infection 08/2013   History of kidney stones    History of MRSA infection 08/2013   left leg   Hx of osteopenia    Lynch syndrome 10/17/2010   Hereditary colon cancer   NHL (non-Hodgkin's lymphoma) ([place]) 10/17/2010   MONITORED BY DR Tressie Stalker   Personal history of renal cancer S/P LEFT PARTIAL NEPHRECTOMY 2007--  NO  RECURRENCE   DUE TO KIDNEY CANCER   Right shoulder pain S/P ROTATOR CUFF REPAIR   Past Surgical History:  Procedure Laterality Date   ABDOMINAL HYSTERECTOMY     APPENDECTOMY  1993   W/ LAVH   COLONOSCOPY W/ POLYPECTOMY     CYSTOSCOPY W/ RETROGRADES  07/14/2014   Procedure: CYSTOSCOPY WITH RETROGRADE PYELOGRAM;  Surgeon: Franchot Gallo, MD;  Location: [facility];  Service: Urology;;   CYSTOSCOPY W/ URETERAL STENT REMOVAL Left 07/14/2014   Procedure: CYSTOSCOPY WITH STENT REMOVAL;  Surgeon: Franchot Gallo, MD;  Location: [facility];  Service: Urology;  Laterality: Left;   CYSTOSCOPY WITH RETROGRADE PYELOGRAM, URETEROSCOPY AND STENT PLACEMENT Left 11/30/2020   Procedure: CYSTOSCOPY WITH RETROGRADE PYELOGRAM, URETEROSCOPY/STONE EXTRACTION AND STENT PLACEMENT;  Surgeon: Franchot Gallo, MD;  Location: [facility];  Service: Urology;  Laterality: Left;   CYSTOSCOPY WITH STENT PLACEMENT  02/07/2012   Procedure: CYSTOSCOPY WITH STENT PLACEMENT;  Surgeon: Franchot Gallo, MD;  Location: [facility];  Service: Urology;  Laterality: Left;   CYSTOSCOPY WITH STENT PLACEMENT Left 07/14/2014   Procedure: CYSTOSCOPY WITH STENT PLACEMENT;  Surgeon: Franchot Gallo, MD;  Location: [facility];  Service: Urology;  Laterality: Left;   FLEXIBLE SIGMOIDOSCOPY  08/29/2011   Procedure: FLEXIBLE SIGMOIDOSCOPY;  Surgeon: Rogene Houston, MD;  [facility];  Service: Endoscopy;  Laterality: N/A;  [place] N/A 09/28/2012   Procedure: FLEXIBLE  SIGMOIDOSCOPY;  Surgeon: Rogene Houston, MD;  [facility];  Service: Endoscopy;  Laterality: N/A;  [place] N/A 07/08/2014   Procedure: FLEXIBLE SIGMOIDOSCOPY;  Surgeon: Rogene Houston, MD;  [facility];  Service: Endoscopy;  Laterality: N/A;  830 - moved to 10:20 - Ann to notify pt   FLEXIBLE SIGMOIDOSCOPY N/A 08/11/2015    Procedure: FLEXIBLE SIGMOIDOSCOPY;  Surgeon: Rogene Houston, MD;  [facility];  Service: Endoscopy;  Laterality: N/A;  [place] N/A 08/13/2016   Procedure: FLEXIBLE SIGMOIDOSCOPY;  Surgeon: Rogene Houston, MD;  [facility];  Service: Endoscopy;  Laterality: N/A;  [place] N/A 10/06/2019   Procedure: FLEXIBLE SIGMOIDOSCOPY;  Surgeon: Rogene Houston, MD;  [facility];  Service: Endoscopy;  Laterality: N/A;  1245   HEMICOLECTOMY  2001   right   HEMICOLECTOMY  2002   left   HOLMIUM LASER APPLICATION Left 4/66/5993   Procedure: HOLMIUM LASER LITHOTRIPSY,;  Surgeon: Franchot Gallo, MD;  Location: [facility];  Service: Urology;  Laterality: Left;   HOLMIUM LASER APPLICATION Left 5/70/1779   Procedure: HOLMIUM LASER APPLICATION;  Surgeon: Franchot Gallo, MD;  Location: [facility];  Service: Urology;  Laterality: Left;   IR RADIOLOGIST EVAL & MGMT  04/29/2018   IR RADIOLOGIST EVAL & MGMT  10/07/2019   IR RADIOLOGIST EVAL & MGMT  10/11/2020   kidney resection  2007   left partial   LAPAROSCOPIC ASSISTED VAGINAL HYSTERECTOMY  1993   W/ BILATERAL SALPINGO-OOPHORECTOMY   LEFT FLANK EXPLORATION W/  PARTIAL LEFT NEPHRECTOMY/ LEFT HILAR LYMPH NODE BX  04-17-2005  DR DAHLSTEDT   RENAL CELL CARCINOMA   LITHOTRIPSY  11-22013   x2   NEPHROLITHOTOMY Left 06/09/2014   Procedure: NEPHROLITHOTOMY PERCUTANEOUS;  Surgeon: Franchot Gallo, MD;  Location: [facility];  Service: Urology;  Laterality: Left;  with STENT   RADIOLOGY WITH ANESTHESIA Left 05/20/2018   Procedure: CT WITH ANESTHESIA LEFT RENAL CRYOABLATION AND BIOPSY;  Surgeon: Greggory Keen, MD;  Location: [facility];  Service: Anesthesiology;  Laterality: Left;   RIGHT ROTATOR  CUFF REPAIR/   BICEP REPAIR  10-17-2011  DR SUPPLE   SUBTOTAL COLECTOMY  2007   URETEROSCOPY WITH HOLMIUM LASER LITHOTRIPSY Left 07/14/2014   Procedure: URETEROSCOPY   EXTRACTION OF STONES;  Surgeon: Franchot Gallo, MD;  Location: [facility];  Service: Urology;  Laterality: Left;    SOCIAL HISTORY:  Social History   Socioeconomic History   Marital status: Divorced    Spouse name: Not on file   Number of children: Not on file   Years of education: Not on file   Highest education level: Not on file  Occupational History   Not on file  Tobacco Use   Smoking status: Never   Smokeless tobacco: Never  Vaping Use   Vaping Use: Never used  Substance and Sexual Activity   Alcohol use: Yes    Comment: 2 glasses of wine per year - maybe   Drug use: No   Sexual activity: Not Currently  Other Topics Concern   Not on file  Social History Narrative   Not on file   Social Determinants of Health   Financial Resource Strain: Low Risk  (04/06/2020)   Overall Financial Resource Strain (CARDIA)    Difficulty of Paying Living Expenses: Not hard at all  Food Insecurity: No Food Insecurity (04/06/2020)  Hunger Vital Sign    Worried About Running Out of Food in the Last Year: Never true    Ran Out of Food in the Last Year: Never true  Transportation Needs: No Transportation Needs (04/06/2020)   PRAPARE - Hydrologist (Medical): No    Lack of Transportation (Non-Medical): No  Physical Activity: Inactive (04/06/2020)   Exercise Vital Sign    Days of Exercise per Week: 0 days    Minutes of Exercise per Session: 0 min  Stress: No Stress Concern Present (04/06/2020)   [place]    Feeling of Stress : Only a little  Social Connections: Moderately Isolated (04/06/2020)   Social Connection and Isolation Panel [NHANES]    Frequency of Communication with Friends and Family: More than three times a week    Frequency of Social Gatherings with Friends and Family: Twice a week    Attends Religious Services: More than 4 times per year    Active Member of [employer]  or Organizations: No    Attends Archivist Meetings: Never    Marital Status: Divorced  Human resources officer Violence: Not At Risk (04/06/2020)   Humiliation, Afraid, Rape, and Kick questionnaire    Fear of Current or Ex-Partner: No    Emotionally Abused: No    Physically Abused: No    Sexually Abused: No    FAMILY HISTORY:  Family History  Problem Relation Age of Onset   Cancer Son     CURRENT MEDICATIONS:  Current Outpatient Medications  Medication Sig Dispense Refill   Cholecalciferol (VITAMIN D3) 125 MCG (5000 UT) CAPS Take 5,000 Units by mouth daily.     cyanocobalamin (,VITAMIN B-12,) 1000 MCG/ML injection ADMINISTER 1 ML(1000 MCG) IN THE MUSCLE EVERY 21 DAYS 1 mL 17   furosemide (LASIX) 40 MG tablet Take 40 mg by mouth daily as needed for fluid.      ibuprofen (ADVIL,MOTRIN) 800 MG tablet Take 800 mg by mouth 3 (three) times daily as needed for moderate pain.  11   phentermine 37.5 MG capsule Take 37.5 mg by mouth daily.     Potassium Citrate 15 MEQ (1620 MG) TBCR Take 1 tablet by mouth 3 (three) times daily. 270 tablet 0   diphenoxylate-atropine (LOMOTIL) 2.5-0.025 MG tablet Take 1 tablet by mouth 4 (four) times daily as needed for diarrhea or loose stools. 60 tablet 6   milk thistle 175 MG tablet Take 1,000 mg by mouth daily.     No current facility-administered medications for this visit.    ALLERGIES:  Allergies  Allergen Reactions   Latex Rash   Morphine And Related Other (See Comments)    hallucinations   Tape Rash    Certain tapes     PHYSICAL EXAM:  Performance status (ECOG): 1 - Symptomatic but completely ambulatory  Vitals:   11/05/21 1138  BP: (!) 158/78  Pulse: 93  Resp: 18  Temp: 99.6 F (37.6 C)  SpO2: 100%   Wt Readings from Last 3 Encounters:  11/05/21 180 lb 3.2 oz (81.7 kg)  04/30/21 180 lb 12.4 oz (82 kg)  11/30/20 186 lb 6.4 oz (84.6 kg)   Physical Exam Vitals reviewed.  Constitutional:      Appearance: Normal appearance.   Cardiovascular:     Rate and Rhythm: Normal rate and regular rhythm.     Pulses: Normal pulses.     Heart sounds: Normal heart sounds.  Pulmonary:  Effort: Pulmonary effort is normal.     Breath sounds: Normal breath sounds.  Abdominal:     Palpations: Abdomen is soft. There is no hepatomegaly, splenomegaly or mass.     Tenderness: There is no abdominal tenderness.  Musculoskeletal:     Right lower leg: No edema.     Left lower leg: No edema.  Lymphadenopathy:     Cervical: No cervical adenopathy.     Right cervical: No superficial cervical adenopathy.    Left cervical: No superficial cervical adenopathy.     Upper Body:     Right upper body: No supraclavicular or axillary adenopathy.     Left upper body: No supraclavicular or axillary adenopathy.  Neurological:     General: No focal deficit present.     Mental Status: Nicole [name] is alert and oriented to person, place, and time.  Psychiatric:        Mood and Affect: Mood normal.        Behavior: Behavior normal.      LABORATORY DATA:  I have reviewed the labs as listed.     Latest Ref Rng & Units 10/29/2021    9:42 AM 04/23/2021    9:51 AM 11/30/2020    6:45 AM  CBC  WBC 4.0 - 10.5 K/uL 6.0  6.5    Hemoglobin 12.0 - 15.0 g/dL 13.0  12.4  12.6   Hematocrit 36.0 - 46.0 % 40.1  38.5  37.0   Platelets 150 - 400 K/uL 150  158        Latest Ref Rng & Units 10/29/2021    9:42 AM 04/23/2021    9:51 AM 11/30/2020    6:45 AM  CMP  Glucose 70 - 99 mg/dL 103  101  105   BUN 8 - 23 mg/dL _0 Creatinine 0.44 - 1.00 mg/dL 0.75  0.84  0.70   Sodium 135 - 145 mmol/L 142  138  142   Potassium 3.5 - 5.1 mmol/L 4.5  4.3  4.0   Chloride 98 - 111 mmol/L 109  102  103   CO2 22 - 32 mmol/L 27  27    Calcium 8.9 - 10.3 mg/dL 9.1  8.9    Total Protein 6.5 - 8.1 g/dL 6.8  6.3    Total Bilirubin 0.3 - 1.2 mg/dL 0.4  0.2    Alkaline Phos 38 - 126 U/L 65  66    AST 15 - 41 U/L 19  16    ALT 0 - 44 U/L 16  15      DIAGNOSTIC IMAGING:   I have independently reviewed the scans and discussed with the patient. CT ABDOMEN PELVIS W WO CONTRAST  Result Date: 10/30/2021 CLINICAL DATA:  Left renal cell carcinoma surveillance status post cryoablation and partial nephrectomy * Tracking Code: BO * EXAM: CT ABDOMEN AND PELVIS WITHOUT AND WITH CONTRAST TECHNIQUE: Multidetector CT imaging of the abdomen and pelvis was performed following the standard protocol before and following the bolus administration of intravenous contrast. RADIATION DOSE REDUCTION: This exam was performed according to the departmental dose-optimization program which includes automated exposure control, adjustment of the mA and/or kV according to patient size and/or use of iterative reconstruction technique. CONTRAST:  13m OMNIPAQUE IOHEXOL 300 MG/ML  SOLN COMPARISON:  10/09/2020 FINDINGS: Lower chest: No acute abnormality. Dependent bibasilar scarring or atelectasis. Hepatobiliary: No solid liver abnormality is seen. Gallstones. No gallbladder wall thickening, or biliary dilatation. Pancreas: Unremarkable. No pancreatic ductal  dilatation or surrounding inflammatory changes. Spleen: Normal in size without significant abnormality. Adrenals/Urinary Tract: Adrenal glands are unremarkable. Unchanged postoperative/postprocedural appearance of the left kidney, with ablation defect of the peripheral superior pole (series 6, image 32) and partial nephrectomy defect of the inferior pole (series 6, image 48). No suspicious soft tissue or contrast enhancement. Multiple left renal calculi, including staghorn calculi of the inferior pole calices, and a calculus within the left renal pelvis measuring 0.7 cm (series 6, image 43). At least one punctuate calculus in the superior pole of the right kidney (series 4, image 68). Bladder is unremarkable. Stomach/Bowel: Stomach is within normal limits. Diverticulum of the descending duodenum. Status posts subtotal colectomy with ileocolic anastomosis.  Vascular/Lymphatic: Aortic atherosclerosis. Numerous enlarged, somewhat matted appearing retroperitoneal lymph nodes are not significantly changed, largest left retroperitoneal node measuring 2.7 x 2.2 cm (series 6, image 44). Unchanged small prominent mesenteric lymph nodes with some associated fat stranding (series 11, image 55). Reproductive: Status post hysterectomy. Other: No abdominal wall hernia or abnormality. No ascites. Musculoskeletal: No acute or significant osseous findings. IMPRESSION: 1. Unchanged postoperative/postprocedural appearance of the left kidney, with ablation defect of the peripheral superior pole and partial nephrectomy defect of the inferior pole. No suspicious soft tissue or contrast enhancement to suggest local recurrence. 2. Unchanged enlarged, somewhat matted appearing retroperitoneal lymph nodes as well as small prominent mesenteric lymph nodes. These remain nonspecific and given indolent behavior over multiple prior examinations nodal metastatic disease is not favored. Indolent lymphoma such as CLL does however remain a consideration. 3. Multiple left renal calculi, including staghorn calculi of the inferior pole calices, and a calculus within the left renal pelvis measuring 0.7 cm. At least one punctuate calculus in the superior pole of the right kidney. No hydronephrosis. 4. Status post subtotal colectomy with ileocolic anastomosis. 5. Cholelithiasis. Aortic Atherosclerosis (ICD10-I70.0). Electronically Signed   By: Delanna Ahmadi M.D.   On: 10/30/2021 08:17     ASSESSMENT:  1.  Left renal cell carcinoma: -Left partial nephrectomy on 04/17/2005, stage T1 a clear-cell renal cell carcinoma, Fuhrman nuclear grade 3/4, tumor size 1.9 cm. -MRI of the abdomen on 04/21/2018 showed left upper pole renal lesion suspicious for recurrent renal cell cancer near the partial nephrectomy site.  Stable retrocrural and retroperitoneal lymphadenopathy. -Left renal mass biopsy and cryoablation on  05/20/2018, biopsy consistent with clear-cell renal cell carcinoma. -CT abdomen with and without contrast on 09/22/2019 shows left partial nephrectomy with stable appearance of the cryoablation defect in the upper pole of the left kidney with no new or progressive findings.  Bilateral renal stones stable.  Stable retroperitoneal adenopathy.  Hepatomegaly with steatosis.   2.  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 -TAH and BSO in 1993. -Flex sigmoidoscopy on 08/13/2016 with normal ileum, end-to-end ileocolonic anastomosis, normal rectosigmoid colon, normal rectum. - Last flexible sigmoidoscopy on 10/06/2019, normal.   3.  Colon cancer: -Subtotal colectomy with ileorectal anastomosis.  No change in bowel habits.   PLAN:  1.  Left renal cell carcinoma: - CTA PE (10/29/2021): Stable findings in the left kidney.  Multiple left renal calculi including staghorn calculi discussed with the patient.  Most likely left loin pain is coming from kidney stones.  Nicole [name] will follow-up with Dr. Diona Fanti.  Reviewed labs from 10/29/2021 which showed normal LFTs, LDH and CBC.  Recommend follow-up in 1 year with CTAP with contrast and labs.   2.  Lynch syndrome: - I have recommended follow-up with GI for sigmoidoscopy.   3.  Colon cancer: - CEA is 3.3.  CT scan did not show any metastatic disease.   4.  Follicular lymphoma: - Retroperitoneal lymphadenopathy is stable on the current scan from 10/29/2021.  No B symptoms.   5.  B12 deficiency: - Continue B12 injections every 3 weeks.  B12 is 363.   Orders placed this encounter:  Orders Placed This Encounter  Procedures   CT Abdomen Pelvis W Contrast   CBC with Differential   Comprehensive metabolic panel   Lactate dehydrogenase   VITAMIN D 25 Hydroxy (Vit-D Deficiency, Fractures)   Vitamin B12   CEA      Derek Jack, MD [place] [phone]   I, Thana Ates, am acting as a scribe for Dr. Derek Jack.  I,  Derek Jack MD, have reviewed the above documentation for accuracy and completeness, and I agree with the above.

## 2021-11-06 ENCOUNTER — Telehealth: Payer: Self-pay

## 2021-11-06 NOTE — Telephone Encounter (Signed)
Patient called office to let MD know her CT report is available in Epic for viewing.  Message sent to provider.

## 2021-11-08 ENCOUNTER — Ambulatory Visit
Admission: RE | Admit: 2021-11-08 | Discharge: 2021-11-08 | Disposition: A | Discharge status: Home / Self Care | Payer: PPO | Source: Ambulatory Visit | Attending: Interventional Radiology | Admitting: Interventional Radiology

## 2021-11-08 DIAGNOSIS — N2889 Other specified disorders of kidney and ureter: Secondary | ICD-10-CM

## 2021-11-08 HISTORY — PX: IR RADIOLOGIST EVAL & MGMT: IMG5224

## 2021-11-08 NOTE — Progress Notes (Signed)
Patient ID: Nicole [name], female   DOB: 08-Nov-1950, 71 y.o.   MRN: [id number]       Chief Complaint:  Left renal cell carcinoma  Referring Physician(s): Dr. Diona Fanti and Delton Coombes  History of Present Illness: Nicole [name] is a 71 y.o. female who has a history of colon cancer, follicular lymphoma, and left renal cell carcinoma.  She had a remote previous partial left nephrectomy in 2007.  Surveillance imaging demonstrated a left upper pole solid enhancing renal mass consistent with recurrence along the nephrectomy site.  This was successfully treated with CT-guided cryoablation and biopsy 05/20/2018 at [facility].  Biopsy did confirm clear-cell renal cell carcinoma.  She continues to undergo surveillance CT imaging annually.  Overall she is doing very well.  No flank or abdominal pain.  No other urinary tract symptoms, fever, or renal.  No recent UTI.  She does have a chronic history of nephrolithiasis.  Surveillance imaging from [facility] 10/29/2021 demonstrates a stable ablation defect as well as partial nephrectomy changes of the left kidney.  No new renal normal lesion or suspicious mass or abnormal enhancement.  Nonobstructing nephrolithiasis noted most pronounced in the left kidney.  Incidental gallstones again noted.  Stable periaortic retroperitoneal adenopathy.  No new or acute process within the image abdomen.  Past Medical History:  Diagnosis Date   Arthritis    shorulder   B12 deficiency    Chronic diarrhea SECONDARY HX COLON CANCER   History of colon cancer [facility] SYNDROME ---  COLORECTAL CANCER S/P COLECTOMY X3  LAST ONE 2007   NO RECURRENCE   History of colon cancer 05/04/2013   History of kidney infection 08/2013   History of kidney stones    History of MRSA infection 08/2013   left leg   Hx of osteopenia    Lynch syndrome 10/17/2010   Hereditary colon cancer   NHL (non-Hodgkin's lymphoma) ([place]) 10/17/2010   MONITORED BY DR Tressie Stalker   Personal  history of renal cancer S/P LEFT PARTIAL NEPHRECTOMY 2007--  NO RECURRENCE   DUE TO KIDNEY CANCER   Right shoulder pain S/P ROTATOR CUFF REPAIR    Past Surgical History:  Procedure Laterality Date   ABDOMINAL HYSTERECTOMY     APPENDECTOMY  1993   W/ LAVH   COLONOSCOPY W/ POLYPECTOMY     CYSTOSCOPY W/ RETROGRADES  07/14/2014   Procedure: CYSTOSCOPY WITH RETROGRADE PYELOGRAM;  Surgeon: Franchot Gallo, MD;  Location: [facility];  Service: Urology;;   CYSTOSCOPY W/ URETERAL STENT REMOVAL Left 07/14/2014   Procedure: CYSTOSCOPY WITH STENT REMOVAL;  Surgeon: Franchot Gallo, MD;  Location: [facility];  Service: Urology;  Laterality: Left;   CYSTOSCOPY WITH RETROGRADE PYELOGRAM, URETEROSCOPY AND STENT PLACEMENT Left 11/30/2020   Procedure: CYSTOSCOPY WITH RETROGRADE PYELOGRAM, URETEROSCOPY/STONE EXTRACTION AND STENT PLACEMENT;  Surgeon: Franchot Gallo, MD;  Location: [facility];  Service: Urology;  Laterality: Left;   CYSTOSCOPY WITH STENT PLACEMENT  02/07/2012   Procedure: CYSTOSCOPY WITH STENT PLACEMENT;  Surgeon: Franchot Gallo, MD;  Location: [facility];  Service: Urology;  Laterality: Left;   CYSTOSCOPY WITH STENT PLACEMENT Left 07/14/2014   Procedure: CYSTOSCOPY WITH STENT PLACEMENT;  Surgeon: Franchot Gallo, MD;  Location: [facility];  Service: Urology;  Laterality: Left;   FLEXIBLE SIGMOIDOSCOPY  08/29/2011   Procedure: FLEXIBLE SIGMOIDOSCOPY;  Surgeon: Rogene Houston, MD;  [facility];  Service: Endoscopy;  Laterality: N/A;  [place]  09/28/2012   Procedure: FLEXIBLE SIGMOIDOSCOPY;  Surgeon: Rogene Houston, MD;  [facility];  Service: Endoscopy;  Laterality: N/A;  [place] N/A 07/08/2014   Procedure: FLEXIBLE SIGMOIDOSCOPY;  Surgeon: Rogene Houston, MD;  [facility];  Service: Endoscopy;  Laterality: N/A;  830 - moved to  10:20 - Ann to notify pt   FLEXIBLE SIGMOIDOSCOPY N/A 08/11/2015   Procedure: FLEXIBLE SIGMOIDOSCOPY;  Surgeon: Rogene Houston, MD;  [facility];  Service: Endoscopy;  Laterality: N/A;  [place] N/A 08/13/2016   Procedure: FLEXIBLE SIGMOIDOSCOPY;  Surgeon: Rogene Houston, MD;  [facility];  Service: Endoscopy;  Laterality: N/A;  [place] N/A 10/06/2019   Procedure: FLEXIBLE SIGMOIDOSCOPY;  Surgeon: Rogene Houston, MD;  [facility];  Service: Endoscopy;  Laterality: N/A;  1245   HEMICOLECTOMY  2001   right   HEMICOLECTOMY  2002   left   HOLMIUM LASER APPLICATION Left 06/24/8117   Procedure: HOLMIUM LASER LITHOTRIPSY,;  Surgeon: Franchot Gallo, MD;  Location: [facility];  Service: Urology;  Laterality: Left;   HOLMIUM LASER APPLICATION Left 1/47/8295   Procedure: HOLMIUM LASER APPLICATION;  Surgeon: Franchot Gallo, MD;  Location: [facility];  Service: Urology;  Laterality: Left;   IR RADIOLOGIST EVAL & MGMT  04/29/2018   IR RADIOLOGIST EVAL & MGMT  10/07/2019   IR RADIOLOGIST EVAL & MGMT  10/11/2020   kidney resection  2007   left partial   LAPAROSCOPIC ASSISTED VAGINAL HYSTERECTOMY  1993   W/ BILATERAL SALPINGO-OOPHORECTOMY   LEFT FLANK EXPLORATION W/  PARTIAL LEFT NEPHRECTOMY/ LEFT HILAR LYMPH NODE BX  04-17-2005  DR DAHLSTEDT   RENAL CELL CARCINOMA   LITHOTRIPSY  11-22013   x2   NEPHROLITHOTOMY Left 06/09/2014   Procedure: NEPHROLITHOTOMY PERCUTANEOUS;  Surgeon: Franchot Gallo, MD;  [facility];  Service: Urology;  Laterality: Left;  with STENT   RADIOLOGY WITH ANESTHESIA Left 05/20/2018   Procedure: CT WITH ANESTHESIA LEFT RENAL CRYOABLATION AND BIOPSY;  Surgeon: Greggory Keen, MD;  Location[facility];  Service: Anesthesiology;  Laterality: Left;   RIGHT ROTATOR  CUFF REPAIR/   BICEP REPAIR  10-17-2011  DR SUPPLE   SUBTOTAL COLECTOMY  2007   URETEROSCOPY WITH HOLMIUM  LASER LITHOTRIPSY Left 07/14/2014   Procedure: URETEROSCOPY  EXTRACTION OF STONES;  Surgeon: Franchot Gallo, MD;  Location: [facility];  Service: Urology;  Laterality: Left;    Allergies: Latex, Morphine and related, and Tape  Medications: Prior to Admission medications   Medication Sig Start Date End Date Taking? Authorizing Provider  Cholecalciferol (VITAMIN D3) 125 MCG (5000 UT) CAPS Take 5,000 Units by mouth daily.    [provider]  cyanocobalamin (,VITAMIN B-12,) 1000 MCG/ML injection ADMINISTER 1 ML(1000 MCG) IN THE MUSCLE EVERY 21 DAYS 09/19/21   Derek Jack, MD  diphenoxylate-atropine (LOMOTIL) 2.5-0.025 MG tablet Take 1 tablet by mouth 4 (four) times daily as needed for diarrhea or loose stools. 11/05/21   Derek Jack, MD  furosemide (LASIX) 40 MG tablet Take 40 mg by mouth daily as needed for fluid.     [provider]  ibuprofen (ADVIL,MOTRIN) 800 MG tablet Take 800 mg by mouth 3 (three) times daily as needed for moderate pain. 08/01/16   [provider]  milk thistle 175 MG tablet Take 1,000 mg by mouth daily.    [provider]  phentermine  37.5 MG capsule Take 37.5 mg by mouth daily. 09/09/20   [provider]  Potassium Citrate 15 MEQ (1620 MG) TBCR Take 1 tablet by mouth 3 (three) times daily. 11/14/20   Franchot Gallo, MD     Family History  Problem Relation Age of Onset   Cancer Son     Social History   Socioeconomic History   Marital status: Divorced    Spouse name: Not on file   Number of children: Not on file   Years of education: Not on file   Highest education level: Not on file  Occupational History   Not on file  Tobacco Use   Smoking status: Never   Smokeless tobacco: Never  Vaping Use   Vaping Use: Never used  Substance and Sexual Activity   Alcohol use: Yes    Comment: 2 glasses of wine per year - maybe   Drug use: No   Sexual activity: Not Currently  Other Topics  Concern   Not on file  Social History Narrative   Not on file   Social Determinants of Health   Financial Resource Strain: Low Risk  (04/06/2020)   Overall Financial Resource Strain (CARDIA)    Difficulty of Paying Living Expenses: Not hard at all  Food Insecurity: No Food Insecurity (04/06/2020)   Hunger Vital Sign    Worried About Running Out of Food in the Last Year: Never true    [place] in the Last Year: Never true  Transportation Needs: No Transportation Needs (04/06/2020)   PRAPARE - Hydrologist (Medical): No    Lack of Transportation (Non-Medical): No  Physical Activity: Inactive (04/06/2020)   Exercise Vital Sign    Days of Exercise per Week: 0 days    Minutes of Exercise per Session: 0 min  Stress: No Stress Concern Present (04/06/2020)   [place]    Feeling of Stress : Only a little  Social Connections: Moderately Isolated (04/06/2020)   Social Connection and Isolation Panel [NHANES]    Frequency of Communication with Friends and Family: More than three times a week    Frequency of Social Gatherings with Friends and Family: Twice a week    Attends Religious Services: More than 4 times per year    Active Member of [employer] or Organizations: No    Attends Archivist Meetings: Never    Marital Status: Divorced       Review of Systems  Review of Systems: A 12 point ROS discussed and pertinent positives are indicated in the HPI above.  All other systems are negative.      Physical Exam No direct physical exam was performed   Telephone health visit only today to review surveillance imaging Vital Signs: There were no vitals taken for this visit.  Imaging: CT ABDOMEN PELVIS W WO CONTRAST  Result Date: 10/30/2021 CLINICAL DATA:  Left renal cell carcinoma surveillance status post cryoablation and partial nephrectomy * Tracking Code: BO * EXAM: CT ABDOMEN AND  PELVIS WITHOUT AND WITH CONTRAST TECHNIQUE: Multidetector CT imaging of the abdomen and pelvis was performed following the standard protocol before and following the bolus administration of intravenous contrast. RADIATION DOSE REDUCTION: This exam was performed according to the departmental dose-optimization program which includes automated exposure control, adjustment of the mA and/or kV according to patient size and/or use of iterative reconstruction technique. CONTRAST:  163m OMNIPAQUE IOHEXOL 300 MG/ML  SOLN COMPARISON:  10/09/2020 FINDINGS: Lower chest: No acute abnormality. Dependent bibasilar scarring or atelectasis. Hepatobiliary: No solid liver abnormality is seen. Gallstones. No gallbladder wall thickening, or biliary dilatation. Pancreas: Unremarkable. No pancreatic ductal dilatation or surrounding inflammatory changes. Spleen: Normal in size without significant abnormality. Adrenals/Urinary Tract: Adrenal glands are unremarkable. Unchanged postoperative/postprocedural appearance of the left kidney, with ablation defect of the peripheral superior pole (series 6, image 32) and partial nephrectomy defect of the inferior pole (series 6, image 48). No suspicious soft tissue or contrast enhancement. Multiple left renal calculi, including staghorn calculi of the inferior pole calices, and a calculus within the left renal pelvis measuring 0.7 cm (series 6, image 43). At least one punctuate calculus in the superior pole of the right kidney (series 4, image 68). Bladder is unremarkable. Stomach/Bowel: Stomach is within normal limits. Diverticulum of the descending duodenum. Status posts subtotal colectomy with ileocolic anastomosis. Vascular/Lymphatic: Aortic atherosclerosis. Numerous enlarged, somewhat matted appearing retroperitoneal lymph nodes are not significantly changed, largest left retroperitoneal node measuring 2.7 x 2.2 cm (series 6, image 44). Unchanged small prominent mesenteric lymph nodes with some  associated fat stranding (series 11, image 55). Reproductive: Status post hysterectomy. Other: No abdominal wall hernia or abnormality. No ascites. Musculoskeletal: No acute or significant osseous findings. IMPRESSION: 1. Unchanged postoperative/postprocedural appearance of the left kidney, with ablation defect of the peripheral superior pole and partial nephrectomy defect of the inferior pole. No suspicious soft tissue or contrast enhancement to suggest local recurrence. 2. Unchanged enlarged, somewhat matted appearing retroperitoneal lymph nodes as well as small prominent mesenteric lymph nodes. These remain nonspecific and given indolent behavior over multiple prior examinations nodal metastatic disease is not favored. Indolent lymphoma such as CLL does however remain a consideration. 3. Multiple left renal calculi, including staghorn calculi of the inferior pole calices, and a calculus within the left renal pelvis measuring 0.7 cm. At least one punctuate calculus in the superior pole of the right kidney. No hydronephrosis. 4. Status post subtotal colectomy with ileocolic anastomosis. 5. Cholelithiasis. Aortic Atherosclerosis (ICD10-I70.0). Electronically Signed   By: Delanna Ahmadi M.D.   On: 10/30/2021 08:17    Labs:  CBC: Recent Labs    11/30/20 0645 04/23/21 0951 10/29/21 0942  WBC  --  6.5 6.0  HGB 12.6 12.4 13.0  HCT 37.0 38.5 40.1  PLT  --  158 150    COAGS: No results for input(s): "INR", "APTT" in the last 8760 hours.  BMP: Recent Labs    11/30/20 0645 04/23/21 0951 10/29/21 0942  NA 142 138 142  K 4.0 4.3 4.5  CL 103 102 109  CO2  --  27 27  GLUCOSE 105* 101* 103*  BUN 19 25* 23  CALCIUM  --  8.9 9.1  CREATININE 0.70 0.84 0.75  GFRNONAA  --  >60 >60    LIVER FUNCTION TESTS: Recent Labs    04/23/21 0951 10/29/21 0942  BILITOT 0.2* 0.4  AST 16 19  ALT 15 16  ALKPHOS 66 65  PROT 6.3* 6.8  ALBUMIN 3.6 3.7      Assessment and Plan:  Approximately 3.5 years  status post recurrent left renal cell carcinoma cryoablation with biopsy.  Again, biopsy confirmed clear-cell renal cell carcinoma.  Annual surveillance imaging demonstrates a stable ablation defect without signs of recurrent disease.  No new suspicious renal abnormality or abnormal enhancement.  Similar chronic nephrolithiasis as well as asymptomatic gallstones.  Plan: Continue annual surveillance imaging in 1 [facility] at [facility] for total  surveillance of 5 years.    Electronically Signed: Greggory Keen 11/08/2021, 9:59 AM   I spent a total of    40 Minutes in remote  clinical consultation, greater than 50% of which was counseling/coordinating care for this patient with renal cell carcinoma.    Visit type: Audio only (telephone). Audio (no video) only due to patient's lack of internet/smartphone capability. Alternative for in-person consultation at [facility], [address], [place], [place]. This visit type was conducted due to national recommendations for restrictions regarding the COVID-19 Pandemic (e.g. social distancing).  This format is felt to be most appropriate for this patient at this time.  All issues noted in this document were discussed and addressed.

## 2022-03-05 ENCOUNTER — Ambulatory Visit: Payer: PPO | Admitting: Urology

## 2022-03-19 ENCOUNTER — Ambulatory Visit: Payer: PPO | Admitting: Urology

## 2022-04-01 ENCOUNTER — Other Ambulatory Visit (HOSPITAL_COMMUNITY): Payer: Self-pay | Admitting: Family Medicine

## 2022-04-01 DIAGNOSIS — N6019 Diffuse cystic mastopathy of unspecified breast: Secondary | ICD-10-CM

## 2022-04-02 ENCOUNTER — Ambulatory Visit (HOSPITAL_COMMUNITY)
Admission: RE | Admit: 2022-04-02 | Discharge: 2022-04-02 | Disposition: A | Discharge status: Home / Self Care | Payer: PPO | Source: Ambulatory Visit | Attending: Urology | Admitting: Urology

## 2022-04-02 ENCOUNTER — Ambulatory Visit (INDEPENDENT_AMBULATORY_CARE_PROVIDER_SITE_OTHER): Payer: PPO | Admitting: Urology

## 2022-04-02 VITALS — BP 156/73 | HR 76

## 2022-04-02 DIAGNOSIS — Z85528 Personal history of other malignant neoplasm of kidney: Secondary | ICD-10-CM | POA: Diagnosis not present

## 2022-04-02 DIAGNOSIS — N200 Calculus of kidney: Secondary | ICD-10-CM | POA: Insufficient documentation

## 2022-04-02 NOTE — Progress Notes (Signed)
History of Present Illness:  History of Urolithiasis:She underwent left PCNL March, 2016 followed by left-sided ureteroscopy with laser and extraction of residual left renal calculi in April 16. 24-hour urine showed low magnesium and citrate level. Stone analysis revealed carbonate apatite.   9.15.2022: She underwent ureteroscopic management of large left lower pole branch stone with holmium laser.  Stent removed 3 weeks later   She is on potassium citrate 15 mEq tid--most of the time she just takes it twice a day, however.   12.13.2022: Abdominal x-ray revealed 8 and 12 mm left lower pole calcifications.  6.27.2023: She has had no recent gross hematuria.  Recent KUB performed revealed stable appearance of left lower pole calcifications.  She is still on potassiums citrate 15 mEq 3 times a day.     History of renal cell carcinoma: She is status post left partial nephrectomy 1.31.2007 for renal cell carcinoma. Final pathology revealed stage T1a clear cell carcinoma, Fuhrman nuclear grade 3/4. Tumor size was 1.9 cm.   CT A/P 1/18 revealed no evidence of recurrence.   1.6.2020: CT scan revealed enhancing 19 mm left upper pole renal mass consistent with recurrent renal cell carcinoma. There is stable retroperitoneal/peri aortic adenopathy. This was treated w/ cryoablation on 3.4.2020.   6.28.2022: She is followed by Dr. Delton Coombes.  She saw him in January of this year and got a clean bill of health.  She does have Lynch syndrome.   1.16.2024: Here for routine check.  No stone symptoms over the past year.  She had a CT in August 2023 revealing a branch stone in the lower pole of the left kidney as well as a 7 mm left renal pelvic stone.    Past Medical History:  Diagnosis Date   Arthritis    shorulder   B12 deficiency    Chronic diarrhea SECONDARY HX COLON CANCER   History of colon cancer [facility] SYNDROME ---  COLORECTAL CANCER S/P COLECTOMY X3  LAST ONE 2007   NO RECURRENCE   History of  colon cancer 05/04/2013   History of kidney infection 08/2013   History of kidney stones    History of MRSA infection 08/2013   left leg   Hx of osteopenia    Lynch syndrome 10/17/2010   Hereditary colon cancer   NHL (non-Hodgkin's lymphoma) ([place]) 10/17/2010   MONITORED BY DR Tressie Stalker   Personal history of renal cancer S/P LEFT PARTIAL NEPHRECTOMY 2007--  NO RECURRENCE   DUE TO KIDNEY CANCER   Right shoulder pain S/P ROTATOR CUFF REPAIR    Past Surgical History:  Procedure Laterality Date   ABDOMINAL HYSTERECTOMY     APPENDECTOMY  1993   W/ LAVH   COLONOSCOPY W/ POLYPECTOMY     CYSTOSCOPY W/ RETROGRADES  07/14/2014   Procedure: CYSTOSCOPY WITH RETROGRADE PYELOGRAM;  Surgeon: Franchot Gallo, MD;  Location: [facility];  Service: Urology;;   CYSTOSCOPY W/ URETERAL STENT REMOVAL Left 07/14/2014   Procedure: CYSTOSCOPY WITH STENT REMOVAL;  Surgeon: Franchot Gallo, MD;  Location: [facility];  Service: Urology;  Laterality: Left;   CYSTOSCOPY WITH RETROGRADE PYELOGRAM, URETEROSCOPY AND STENT PLACEMENT Left 11/30/2020   Procedure: CYSTOSCOPY WITH RETROGRADE PYELOGRAM, URETEROSCOPY/STONE EXTRACTION AND STENT PLACEMENT;  Surgeon: Franchot Gallo, MD;  Location: [facility];  Service: Urology;  Laterality: Left;   CYSTOSCOPY WITH STENT PLACEMENT  02/07/2012   Procedure: CYSTOSCOPY WITH STENT PLACEMENT;  Surgeon: Franchot Gallo, MD;  Location: [facility];  Service: Urology;  Laterality:  Left;   CYSTOSCOPY WITH STENT PLACEMENT Left 07/14/2014   Procedure: CYSTOSCOPY WITH STENT PLACEMENT;  Surgeon: Franchot Gallo, MD;  Location: [facility];  Service: Urology;  Laterality: Left;   FLEXIBLE SIGMOIDOSCOPY  08/29/2011   Procedure: FLEXIBLE SIGMOIDOSCOPY;  Surgeon: Rogene Houston, MD;  [facility];  Service: Endoscopy;  Laterality: N/A;  [place] N/A 09/28/2012   Procedure: FLEXIBLE  SIGMOIDOSCOPY;  Surgeon: Rogene Houston, MD;  [facility];  Service: Endoscopy;  Laterality: N/A;  [place] N/A 07/08/2014   Procedure: FLEXIBLE SIGMOIDOSCOPY;  Surgeon: Rogene Houston, MD;  [facility];  Service: Endoscopy;  Laterality: N/A;  830 - moved to 10:20 - Ann to notify pt   FLEXIBLE SIGMOIDOSCOPY N/A 08/11/2015   Procedure: FLEXIBLE SIGMOIDOSCOPY;  Surgeon: Rogene Houston, MD;  [facility];  Service: Endoscopy;  Laterality: N/A;  [place] N/A 08/13/2016   Procedure: FLEXIBLE SIGMOIDOSCOPY;  Surgeon: Rogene Houston, MD;  [facility];  Service: Endoscopy;  Laterality: N/A;  [place] N/A 10/06/2019   Procedure: FLEXIBLE SIGMOIDOSCOPY;  Surgeon: Rogene Houston, MD;  [facility];  Service: Endoscopy;  Laterality: N/A;  1245   HEMICOLECTOMY  2001   right   HEMICOLECTOMY  2002   left   HOLMIUM LASER APPLICATION Left 2/35/3614   Procedure: HOLMIUM LASER LITHOTRIPSY,;  Surgeon: Franchot Gallo, MD;  [facility];  Service: Urology;  Laterality: Left;   HOLMIUM LASER APPLICATION Left 4/31/5400   Procedure: HOLMIUM LASER APPLICATION;  Surgeon: Franchot Gallo, MD;  Location: [facility];  Service: Urology;  Laterality: Left;   IR RADIOLOGIST EVAL & MGMT  04/29/2018   IR RADIOLOGIST EVAL & MGMT  10/07/2019   IR RADIOLOGIST EVAL & MGMT  10/11/2020   IR RADIOLOGIST EVAL & MGMT  11/08/2021   kidney resection  2007   left partial   LAPAROSCOPIC ASSISTED VAGINAL HYSTERECTOMY  1993   W/ BILATERAL SALPINGO-OOPHORECTOMY   LEFT FLANK EXPLORATION W/  PARTIAL LEFT NEPHRECTOMY/ LEFT HILAR LYMPH NODE BX  04-17-2005  DR Jonique Kulig   RENAL CELL CARCINOMA   LITHOTRIPSY  11-22013   x2   NEPHROLITHOTOMY Left 06/09/2014   Procedure: NEPHROLITHOTOMY PERCUTANEOUS;  Surgeon: Franchot Gallo, MD;  [facility];  Service: Urology;  Laterality:  Left;  with STENT   RADIOLOGY WITH ANESTHESIA Left 05/20/2018   Procedure: CT WITH ANESTHESIA LEFT RENAL CRYOABLATION AND BIOPSY;  Surgeon: Greggory Keen, MD;  [facility];  Service: Anesthesiology;  Laterality: Left;   RIGHT ROTATOR  CUFF REPAIR/   BICEP REPAIR  10-17-2011  DR SUPPLE   SUBTOTAL COLECTOMY  2007   URETEROSCOPY WITH HOLMIUM LASER LITHOTRIPSY Left 07/14/2014   Procedure: URETEROSCOPY  EXTRACTION OF STONES;  Surgeon: Franchot Gallo, MD;  Location: [facility];  Service: Urology;  Laterality: Left;    Home Medications:  (Not in a hospital admission)   Allergies:  Allergies  Allergen Reactions   Latex Rash   Morphine And Related Other (See Comments)    hallucinations   Tape Rash    Certain tapes     Family History  Problem Relation Age of Onset   Cancer Son     Social History:  reports that she has never smoked. She has never used smokeless tobacco. She reports current alcohol use. She reports that she does not use  drugs.  ROS: A complete review of systems was performed.  All systems are negative except for pertinent findings as noted.  Physical Exam:  Vital signs in last 24 hours: '@VSRANGES'$ @ General:  Alert and oriented, No acute distress HEENT: Normocephalic, atraumatic Neck: No JVD or lymphadenopathy Cardiovascular: Regular rate  Lungs: Normal inspiratory/expiratory excursion Abdomen: Soft, nontender, nondistended, no abdominal masses Back: No CVA tenderness Extremities: No edema Neurologic: Grossly intact  I have reviewed prior pt notes  I have reviewed notes from referring/previous physicians--Dr. Tomie China note  I have reviewed urinalysis results-urine clear today  I have independently reviewed prior imaging-I reviewed CT images with her    Impression/Assessment:  1.  History of renal cell carcinoma on the left, status post partial nephrectomy and repeat treatment for a recurrence with cryoablation, no evidence of  recurrence  2.  Enlarging left renal calculi  Plan:  1.  As she does have growth of her left renal stones, I would recommend ureteroscopic management as we will be able to clear out her stone burden as well as possible, better than shockwave lithotripsy.  2.  We will call her and schedule this for later in 2024-she will be going on a trip out of the state in March, we will work on scheduling this for April  Lillette Boxer Emmarae Cowdery 04/02/2022, 6:37 AM  Lillette Boxer. Bing Duffey MD

## 2022-04-03 LAB — URINALYSIS, ROUTINE W REFLEX MICROSCOPIC
Bilirubin, UA: NEGATIVE
Glucose, UA: NEGATIVE
Ketones, UA: NEGATIVE
Leukocytes,UA: NEGATIVE
Nitrite, UA: NEGATIVE
Protein,UA: NEGATIVE
Specific Gravity, UA: 1.02 (ref 1.005–1.030)
Urobilinogen, Ur: 0.2 mg/dL (ref 0.2–1.0)
pH, UA: 5.5 (ref 5.0–7.5)

## 2022-04-03 LAB — MICROSCOPIC EXAMINATION: Bacteria, UA: NONE SEEN

## 2022-04-15 ENCOUNTER — Other Ambulatory Visit: Payer: Self-pay | Admitting: Urology

## 2022-04-16 ENCOUNTER — Encounter (HOSPITAL_COMMUNITY): Payer: Self-pay

## 2022-04-16 ENCOUNTER — Ambulatory Visit (HOSPITAL_COMMUNITY)
Admission: RE | Admit: 2022-04-16 | Discharge: 2022-04-16 | Disposition: A | Discharge status: Home / Self Care | Payer: PPO | Source: Ambulatory Visit | Attending: Family Medicine | Admitting: Family Medicine

## 2022-04-16 DIAGNOSIS — N6019 Diffuse cystic mastopathy of unspecified breast: Secondary | ICD-10-CM

## 2022-04-16 DIAGNOSIS — R92322 Mammographic fibroglandular density, left breast: Secondary | ICD-10-CM | POA: Diagnosis not present

## 2022-04-16 DIAGNOSIS — N6002 Solitary cyst of left breast: Secondary | ICD-10-CM | POA: Diagnosis not present

## 2022-04-16 DIAGNOSIS — Z1239 Encounter for other screening for malignant neoplasm of breast: Secondary | ICD-10-CM | POA: Diagnosis not present

## 2022-04-18 ENCOUNTER — Other Ambulatory Visit: Payer: Self-pay

## 2022-04-18 DIAGNOSIS — N200 Calculus of kidney: Secondary | ICD-10-CM

## 2022-05-16 ENCOUNTER — Encounter: Payer: Self-pay | Admitting: Radiology

## 2022-06-13 ENCOUNTER — Ambulatory Visit (INDEPENDENT_AMBULATORY_CARE_PROVIDER_SITE_OTHER): Payer: PPO | Admitting: Urology

## 2022-06-13 ENCOUNTER — Encounter (HOSPITAL_BASED_OUTPATIENT_CLINIC_OR_DEPARTMENT_OTHER): Payer: Self-pay | Admitting: Urology

## 2022-06-13 DIAGNOSIS — N200 Calculus of kidney: Secondary | ICD-10-CM

## 2022-06-13 LAB — URINALYSIS, ROUTINE W REFLEX MICROSCOPIC
Bilirubin, UA: NEGATIVE
Glucose, UA: NEGATIVE
Ketones, UA: NEGATIVE
Leukocytes,UA: NEGATIVE
Nitrite, UA: NEGATIVE
Protein,UA: NEGATIVE
Specific Gravity, UA: 1.015 (ref 1.005–1.030)
Urobilinogen, Ur: 0.2 mg/dL (ref 0.2–1.0)
pH, UA: 5.5 (ref 5.0–7.5)

## 2022-06-13 LAB — MICROSCOPIC EXAMINATION: Epithelial Cells (non renal): >10 /hpf — AB (ref 0–10)

## 2022-06-13 NOTE — Progress Notes (Signed)
Ua/cx check prior to surgery on 4/08 per Dr. Diona Fanti

## 2022-06-13 NOTE — Progress Notes (Signed)
Spoke w/ via phone for pre-op interview--- [place]----  ISTAT             Lab results------ COVID test -----patient states asymptomatic no test needed Arrive at -------0630 NPO after MN NO Solid Food.   Med rec completed Medications to take morning of surgery -----NONE Diabetic medication ----- Patient instructed no nail polish to be worn day of surgery Patient instructed to bring photo id and insurance card day of surgery Patient aware to have Driver (ride ) / caregiver   Evalee Mutton for 24 hours after surgery  Patient Special Instructions ----- Pre-Op special Istructions ----- Patient verbalized understanding of instructions that were given at this phone interview. Patient denies shortness of breath, chest pain, fever, cough at this phone interview.

## 2022-06-14 ENCOUNTER — Ambulatory Visit: Payer: PPO

## 2022-06-15 LAB — URINE CULTURE

## 2022-06-17 ENCOUNTER — Telehealth: Payer: Self-pay

## 2022-06-17 NOTE — Telephone Encounter (Signed)
Patient is aware of MD response and voiced understanding 

## 2022-06-17 NOTE — Telephone Encounter (Signed)
-----   Message from Franchot Gallo, MD sent at 06/17/2022  4:43 PM EDT ----- Let pt know that urine culture was negative ----- Message ----- From: Sherrilyn Rist, CMA Sent: 06/17/2022   4:09 PM EDT To: Franchot Gallo, MD  Please review

## 2022-06-24 ENCOUNTER — Ambulatory Visit (HOSPITAL_BASED_OUTPATIENT_CLINIC_OR_DEPARTMENT_OTHER)
Admission: RE | Admit: 2022-06-24 | Discharge: 2022-06-24 | Disposition: A | Discharge status: Home / Self Care | Payer: PPO | Attending: Urology | Admitting: Urology

## 2022-06-24 ENCOUNTER — Encounter (HOSPITAL_BASED_OUTPATIENT_CLINIC_OR_DEPARTMENT_OTHER)
Admission: RE | Disposition: A | Discharge status: Home / Self Care | Payer: Self-pay | Source: Home / Self Care | Attending: Urology

## 2022-06-24 ENCOUNTER — Other Ambulatory Visit: Payer: Self-pay

## 2022-06-24 ENCOUNTER — Other Ambulatory Visit (HOSPITAL_COMMUNITY): Payer: Self-pay

## 2022-06-24 ENCOUNTER — Ambulatory Visit (HOSPITAL_BASED_OUTPATIENT_CLINIC_OR_DEPARTMENT_OTHER): Payer: PPO | Admitting: Anesthesiology

## 2022-06-24 ENCOUNTER — Encounter (HOSPITAL_COMMUNITY): Payer: Self-pay | Admitting: Hematology

## 2022-06-24 ENCOUNTER — Encounter (HOSPITAL_BASED_OUTPATIENT_CLINIC_OR_DEPARTMENT_OTHER): Payer: Self-pay | Admitting: Urology

## 2022-06-24 DIAGNOSIS — Z8572 Personal history of non-Hodgkin lymphomas: Secondary | ICD-10-CM | POA: Diagnosis not present

## 2022-06-24 DIAGNOSIS — Z85528 Personal history of other malignant neoplasm of kidney: Secondary | ICD-10-CM | POA: Diagnosis not present

## 2022-06-24 DIAGNOSIS — N200 Calculus of kidney: Secondary | ICD-10-CM

## 2022-06-24 DIAGNOSIS — Z905 Acquired absence of kidney: Secondary | ICD-10-CM | POA: Insufficient documentation

## 2022-06-24 DIAGNOSIS — Z01818 Encounter for other preprocedural examination: Secondary | ICD-10-CM

## 2022-06-24 DIAGNOSIS — Z85048 Personal history of other malignant neoplasm of rectum, rectosigmoid junction, and anus: Secondary | ICD-10-CM | POA: Insufficient documentation

## 2022-06-24 DIAGNOSIS — Z08 Encounter for follow-up examination after completed treatment for malignant neoplasm: Secondary | ICD-10-CM | POA: Insufficient documentation

## 2022-06-24 HISTORY — PX: HOLMIUM LASER APPLICATION: SHX5852

## 2022-06-24 HISTORY — PX: CYSTOSCOPY WITH RETROGRADE PYELOGRAM, URETEROSCOPY AND STENT PLACEMENT: SHX5789

## 2022-06-24 LAB — POCT I-STAT, CHEM 8
BUN: 23 mg/dL (ref 8–23)
Calcium, Ion: 1.18 mmol/L (ref 1.15–1.40)
Chloride: 106 mmol/L (ref 98–111)
Creatinine, Ser: 0.9 mg/dL (ref 0.44–1.00)
Glucose, Bld: 96 mg/dL (ref 70–99)
HCT: 40 % (ref 36.0–46.0)
Hemoglobin: 13.6 g/dL (ref 12.0–15.0)
Potassium: 4.6 mmol/L (ref 3.5–5.1)
Sodium: 141 mmol/L (ref 135–145)
TCO2: 25 mmol/L (ref 22–32)

## 2022-06-24 SURGERY — CYSTOURETEROSCOPY, WITH RETROGRADE PYELOGRAM AND STENT INSERTION
Anesthesia: General | Site: Ureter | Laterality: Left

## 2022-06-24 MED ORDER — CEFAZOLIN SODIUM-DEXTROSE 2-4 GM/100ML-% IV SOLN
INTRAVENOUS | Status: AC
  Filled 2022-06-24: qty 100

## 2022-06-24 MED ORDER — LIDOCAINE HCL (PF) 2 % IJ SOLN
INTRAMUSCULAR | Status: AC
  Filled 2022-06-24: qty 5

## 2022-06-24 MED ORDER — FENTANYL CITRATE (PF) 100 MCG/2ML IJ SOLN: 25.0000 ug | INTRAMUSCULAR | Status: DC | PRN

## 2022-06-24 MED ORDER — AMISULPRIDE (ANTIEMETIC) 5 MG/2ML IV SOLN: 10.0000 mg | Freq: Once | INTRAVENOUS | Status: DC | PRN

## 2022-06-24 MED ORDER — SODIUM CHLORIDE 0.9 % IR SOLN
Status: DC | PRN
  Administered 2022-06-24: 3000 mL

## 2022-06-24 MED ORDER — DEXAMETHASONE SODIUM PHOSPHATE 10 MG/ML IJ SOLN
INTRAMUSCULAR | Status: DC | PRN
  Administered 2022-06-24: 5 mg via INTRAVENOUS

## 2022-06-24 MED ORDER — PROPOFOL 10 MG/ML IV BOLUS
INTRAVENOUS | Status: AC
  Filled 2022-06-24: qty 20

## 2022-06-24 MED ORDER — FENTANYL CITRATE (PF) 100 MCG/2ML IJ SOLN
INTRAMUSCULAR | Status: AC
  Filled 2022-06-24: qty 2

## 2022-06-24 MED ORDER — CEPHALEXIN 500 MG PO CAPS
500.0000 mg | ORAL_CAPSULE | Freq: Two times a day (BID) | ORAL | 0 refills | Status: AC
  Filled 2022-06-24: qty 6, 3d supply, fill #0

## 2022-06-24 MED ORDER — MIRABEGRON ER 50 MG PO TB24: 50.0000 mg | ORAL_TABLET | Freq: Every day | ORAL | 11 refills | Status: DC

## 2022-06-24 MED ORDER — 0.9 % SODIUM CHLORIDE (POUR BTL) OPTIME
TOPICAL | Status: DC | PRN
  Administered 2022-06-24: 500 mL

## 2022-06-24 MED ORDER — PROPOFOL 10 MG/ML IV BOLUS
INTRAVENOUS | Status: DC | PRN
  Administered 2022-06-24: 150 mg via INTRAVENOUS

## 2022-06-24 MED ORDER — PHENYLEPHRINE 80 MCG/ML (10ML) SYRINGE FOR IV PUSH (FOR BLOOD PRESSURE SUPPORT)
PREFILLED_SYRINGE | INTRAVENOUS | Status: DC | PRN
  Administered 2022-06-24: 80 ug via INTRAVENOUS

## 2022-06-24 MED ORDER — LACTATED RINGERS IV SOLN
INTRAVENOUS | Status: DC
  Administered 2022-06-24: 1000 mL via INTRAVENOUS

## 2022-06-24 MED ORDER — PHENYLEPHRINE 80 MCG/ML (10ML) SYRINGE FOR IV PUSH (FOR BLOOD PRESSURE SUPPORT)
PREFILLED_SYRINGE | INTRAVENOUS | Status: AC
  Filled 2022-06-24: qty 10

## 2022-06-24 MED ORDER — CEFAZOLIN SODIUM-DEXTROSE 2-4 GM/100ML-% IV SOLN
2.0000 g | INTRAVENOUS | Status: AC
  Administered 2022-06-24: 2 g via INTRAVENOUS

## 2022-06-24 MED ORDER — EPHEDRINE 5 MG/ML INJ
INTRAVENOUS | Status: AC
  Filled 2022-06-24: qty 5

## 2022-06-24 MED ORDER — ONDANSETRON HCL 4 MG/2ML IJ SOLN
INTRAMUSCULAR | Status: DC | PRN
  Administered 2022-06-24: 4 mg via INTRAVENOUS

## 2022-06-24 MED ORDER — ONDANSETRON HCL 4 MG/2ML IJ SOLN
INTRAMUSCULAR | Status: AC
  Filled 2022-06-24: qty 2

## 2022-06-24 MED ORDER — LIDOCAINE 2% (20 MG/ML) 5 ML SYRINGE
INTRAMUSCULAR | Status: DC | PRN
  Administered 2022-06-24: 60 mg via INTRAVENOUS

## 2022-06-24 MED ORDER — ACETAMINOPHEN 500 MG PO TABS
ORAL_TABLET | ORAL | Status: AC
  Filled 2022-06-24: qty 2

## 2022-06-24 MED ORDER — IOHEXOL 300 MG/ML  SOLN
INTRAMUSCULAR | Status: DC | PRN
  Administered 2022-06-24: 8 mL via URETHRAL

## 2022-06-24 MED ORDER — FENTANYL CITRATE (PF) 100 MCG/2ML IJ SOLN
INTRAMUSCULAR | Status: DC | PRN
  Administered 2022-06-24 (×2): 25 ug via INTRAVENOUS

## 2022-06-24 MED ORDER — ACETAMINOPHEN 500 MG PO TABS
1000.0000 mg | ORAL_TABLET | Freq: Once | ORAL | Status: AC
  Administered 2022-06-24: 1000 mg via ORAL

## 2022-06-24 MED ORDER — DEXAMETHASONE SODIUM PHOSPHATE 10 MG/ML IJ SOLN
INTRAMUSCULAR | Status: AC
  Filled 2022-06-24: qty 1

## 2022-06-24 MED ORDER — KETOROLAC TROMETHAMINE 15 MG/ML IJ SOLN: 15.0000 mg | Freq: Once | INTRAMUSCULAR | Status: DC | PRN

## 2022-06-24 MED ORDER — EPHEDRINE SULFATE-NACL 50-0.9 MG/10ML-% IV SOSY
PREFILLED_SYRINGE | INTRAVENOUS | Status: DC | PRN
  Administered 2022-06-24: 5 mg via INTRAVENOUS
  Administered 2022-06-24: 10 mg via INTRAVENOUS

## 2022-06-24 MED ORDER — ONDANSETRON HCL 4 MG/2ML IJ SOLN: 4.0000 mg | Freq: Once | INTRAMUSCULAR | Status: DC | PRN

## 2022-06-24 SURGICAL SUPPLY — 24 items
BAG DRAIN URO-CYSTO SKYTR STRL (DRAIN) ×1 IMPLANT
BAG DRN UROCATH (DRAIN) ×1
BLANKET WARM UPPER BOD BAIR (MISCELLANEOUS) ×1 IMPLANT
CATH URETL OPEN END 6FR 70 (CATHETERS) ×1 IMPLANT
CLOTH BEACON ORANGE TIMEOUT ST (SAFETY) ×1 IMPLANT
COVER DOME SNAP 22 D (MISCELLANEOUS) ×1 IMPLANT
GLOVE BIOGEL PI IND STRL 7.0 (GLOVE) IMPLANT
GLOVE SURG SS PI 8.0 STRL IVOR (GLOVE) IMPLANT
GOWN SRG XL LVL 3 REINF SET IN (GOWNS) IMPLANT
GOWN STRL REIN XL LVL3 (GOWNS) ×1
GOWN STRL REUS W/TWL XL LVL3 (GOWN DISPOSABLE) ×1 IMPLANT
GUIDEWIRE ANG ZIPWIRE 038X150 (WIRE) IMPLANT
GUIDEWIRE STR DUAL SENSOR (WIRE) IMPLANT
IV NS IRRIG 3000ML ARTHROMATIC (IV SOLUTION) ×2 IMPLANT
KIT TURNOVER CYSTO (KITS) ×1 IMPLANT
LASER FIB FLEXIVA PULSE ID 365 (Laser) IMPLANT
MANIFOLD NEPTUNE II (INSTRUMENTS) ×1 IMPLANT
NS IRRIG 500ML POUR BTL (IV SOLUTION) ×1 IMPLANT
PACK CYSTO (CUSTOM PROCEDURE TRAY) ×1 IMPLANT
SHEATH NAVIGATOR HD 11/13X36 (SHEATH) IMPLANT
SLEEVE SCD COMPRESS KNEE MED (STOCKING) ×1 IMPLANT
STENT URET 6FRX26 CONTOUR (STENTS) IMPLANT
TUBE CONNECTING 12X1/4 (SUCTIONS) IMPLANT
TUBING UROLOGY SET (TUBING) IMPLANT

## 2022-06-24 NOTE — H&P (Signed)
H&P  Chief Complaint: Left-sided kidney stone  History of Present Illness: Nicole [name] is a 72 y.o. year old female presenting for ureteroscopic management of her recurring left lower pole stone.  She has a fair stone burden on that side.  Past Medical History:  Diagnosis Date   Arthritis    shorulder   B12 deficiency    Chronic diarrhea SECONDARY HX COLON CANCER   History of colon cancer [facility] SYNDROME ---  COLORECTAL CANCER S/P COLECTOMY X3  LAST ONE 2007   NO RECURRENCE   History of colon cancer 05/04/2013   History of kidney infection 08/2013   History of kidney stones    History of MRSA infection 08/2013   left leg   Hx of osteopenia    Lynch syndrome 10/17/2010   Hereditary colon cancer   NHL (non-Hodgkin's lymphoma) 10/17/2010   MONITORED BY DR Mariel Sleet   Personal history of renal cancer S/P LEFT PARTIAL NEPHRECTOMY 2007--  NO RECURRENCE   DUE TO KIDNEY CANCER   Right shoulder pain S/P ROTATOR CUFF REPAIR    Past Surgical History:  Procedure Laterality Date   ABDOMINAL HYSTERECTOMY     APPENDECTOMY  1993   W/ LAVH   COLONOSCOPY W/ POLYPECTOMY     CYSTOSCOPY W/ RETROGRADES  07/14/2014   Procedure: CYSTOSCOPY WITH RETROGRADE PYELOGRAM;  Surgeon: Marcine Matar, MD;  Location: [facility];  Service: Urology;;   CYSTOSCOPY W/ URETERAL STENT REMOVAL Left 07/14/2014   Procedure: CYSTOSCOPY WITH STENT REMOVAL;  Surgeon: Marcine Matar, MD;  Location: [facility];  Service: Urology;  Laterality: Left;   CYSTOSCOPY WITH RETROGRADE PYELOGRAM, URETEROSCOPY AND STENT PLACEMENT Left 11/30/2020   Procedure: CYSTOSCOPY WITH RETROGRADE PYELOGRAM, URETEROSCOPY/STONE EXTRACTION AND STENT PLACEMENT;  Surgeon: Marcine Matar, MD;  Location: [facility];  Service: Urology;  Laterality: Left;   CYSTOSCOPY WITH STENT PLACEMENT  02/07/2012   Procedure: CYSTOSCOPY WITH STENT PLACEMENT;  Surgeon: Marcine Matar, MD;  Location: [facility];  Service: Urology;  Laterality: Left;   CYSTOSCOPY WITH STENT PLACEMENT Left 07/14/2014   Procedure: CYSTOSCOPY WITH STENT PLACEMENT;  Surgeon: Marcine Matar, MD;  Location: [facility];  Service: Urology;  Laterality: Left;   FLEXIBLE SIGMOIDOSCOPY  08/29/2011   Procedure: FLEXIBLE SIGMOIDOSCOPY;  Surgeon: Malissa Hippo, MD;  [facility];  Service: Endoscopy;  Laterality: N/A;  930   FLEXIBLE SIGMOIDOSCOPY N/A 09/28/2012   Procedure: FLEXIBLE SIGMOIDOSCOPY;  Surgeon: Malissa Hippo, MD;  [facility];  Service: Endoscopy;  Laterality: N/A;  730   FLEXIBLE SIGMOIDOSCOPY N/A 07/08/2014   Procedure: FLEXIBLE SIGMOIDOSCOPY;  Surgeon: Malissa Hippo, MD;  [facility];  Service: Endoscopy;  Laterality: N/A;  830 - moved to 10:20 - Ann to notify pt   FLEXIBLE SIGMOIDOSCOPY N/A 08/11/2015   Procedure: FLEXIBLE SIGMOIDOSCOPY;  Surgeon: Malissa Hippo, MD;  [facility];  Service: Endoscopy;  Laterality: N/A;  730   FLEXIBLE SIGMOIDOSCOPY N/A 08/13/2016   Procedure: FLEXIBLE SIGMOIDOSCOPY;  Surgeon: Malissa Hippo, MD;  [facility];  Service: Endoscopy;  Laterality: N/A;  730   FLEXIBLE SIGMOIDOSCOPY N/A 10/06/2019   Procedure: FLEXIBLE SIGMOIDOSCOPY;  Surgeon: Malissa Hippo, MD;  [facility];  Service: Endoscopy;  Laterality: N/A;  1245   HEMICOLECTOMY  2001   right   HEMICOLECTOMY  2002   left   HOLMIUM LASER APPLICATION Left 07/14/2014   Procedure: HOLMIUM LASER LITHOTRIPSY,;  Surgeon: Marcine MatarStephen Avila Albritton, MD;  [facility];  Service: Urology;  Laterality: Left;   HOLMIUM LASER APPLICATION Left 11/30/2020   Procedure: HOLMIUM LASER APPLICATION;  Surgeon: Marcine Matarahlstedt, Suzi Hernan, MD;  [facility] [place];  Service: Urology;  Laterality: Left;   IR RADIOLOGIST EVAL & MGMT  04/29/2018   IR RADIOLOGIST EVAL & MGMT  10/07/2019   IR RADIOLOGIST EVAL & MGMT  10/11/2020    IR RADIOLOGIST EVAL & MGMT  11/08/2021   kidney resection  2007   left partial   LAPAROSCOPIC ASSISTED VAGINAL HYSTERECTOMY  1993   W/ BILATERAL SALPINGO-OOPHORECTOMY   LEFT FLANK EXPLORATION W/  PARTIAL LEFT NEPHRECTOMY/ LEFT HILAR LYMPH NODE BX  04-17-2005  DR Magdala Brahmbhatt   RENAL CELL CARCINOMA   LITHOTRIPSY  11-22013   x2   NEPHROLITHOTOMY Left 06/09/2014   Procedure: NEPHROLITHOTOMY PERCUTANEOUS;  Surgeon: Marcine MatarStephen Jaselle Pryer, MD;  Location: [facility];  Service: Urology;  Laterality: Left;  with STENT   RADIOLOGY WITH ANESTHESIA Left 05/20/2018   Procedure: CT WITH ANESTHESIA LEFT RENAL CRYOABLATION AND BIOPSY;  Surgeon: Berdine DanceShick, Michael, MD;  Location: [facility];  Service: Anesthesiology;  Laterality: Left;   RIGHT ROTATOR  CUFF REPAIR/   BICEP REPAIR  10-17-2011  DR SUPPLE   SUBTOTAL COLECTOMY  2007   URETEROSCOPY WITH HOLMIUM LASER LITHOTRIPSY Left 07/14/2014   Procedure: URETEROSCOPY  EXTRACTION OF STONES;  Surgeon: Marcine MatarStephen Rian Busche, MD;  [facility] [place];  Service: Urology;  Laterality: Left;    Home Medications:  Medications Prior to Admission  Medication Sig Dispense Refill   Cholecalciferol (VITAMIN D3) 125 MCG (5000 UT) CAPS Take 5,000 Units by mouth daily.     cyanocobalamin (,VITAMIN B-12,) 1000 MCG/ML injection ADMINISTER 1 ML(1000 MCG) IN THE MUSCLE EVERY 21 DAYS 1 mL 17   furosemide (LASIX) 40 MG tablet Take 40 mg by mouth daily as needed for fluid.      magnesium oxide (MAG-OX) 400 (240 Mg) MG tablet Take 400 mg by mouth daily.     Potassium Citrate 15 MEQ (1620 MG) TBCR Take 1 tablet by mouth 3 (three) times daily. 270 tablet 0   diphenoxylate-atropine (LOMOTIL) 2.5-0.025 MG tablet Take 1 tablet by mouth 4 (four) times daily as needed for diarrhea or loose stools. 60 tablet 6   ibuprofen (ADVIL,MOTRIN) 800 MG tablet Take 800 mg by mouth 3 (three) times daily as needed for moderate pain.  11   milk thistle 175 MG tablet Take 1,000 mg by mouth daily.      phentermine 37.5 MG capsule Take 37.5 mg by mouth daily.      Allergies:  Allergies  Allergen Reactions   Latex Rash   Morphine And Related Other (See Comments)    hallucinations   Tape Rash    Certain tapes     Family History  Problem Relation Age of Onset   Cancer Son     Social History:  reports that she has never smoked. She has never used smokeless tobacco. She reports current alcohol use. She reports that she does not use drugs.  ROS: A complete review of systems was performed.  All systems are negative except for pertinent findings as noted.  Physical Exam:  Vital signs in last 24 hours: Temp:  [97.9 F (36.6 C)] 97.9 F (36.6 C) (04/08 0710) Pulse Rate:  [90] 90 (04/08 0710) Resp:  [17] 17 (04/08 0710) BP: (176)/(92) 176/92 (04/08 0710) SpO2:  [100 %] 100 % (04/08 0710) Weight:  [  84.2 kg] 84.2 kg (04/08 0710) General:  Alert and oriented, No acute distress HEENT: Normocephalic, atraumatic Neck: No JVD or lymphadenopathy Cardiovascular: Regular rate  Lungs: Normal inspiratory/expiratory excursion Neurologic: Grossly intact  I have reviewed prior pt notes   I have reviewed urinalysis results  I have independently reviewed prior imaging  I have reviewed prior urine culture   Impression/Assessment:  Left renal calculi, large stone burden  Plan:  Cystoscopy, left retrograde pyelogram, left ureteroscopy with holmium laser management of left renal calculi, placement of double-J stent  [name] Elijiah Mickley 06/24/2022, 8:15 AM  [name]. Tramya Schoenfelder MD

## 2022-06-24 NOTE — Op Note (Signed)
Preoperative diagnosis: Left renal calculi  Postoperative diagnosis: Same  Principal procedure: Cystoscopy, left retrograde ureteropyelogram, fluoroscopic interpretation, left ureteroscopy with holmium laser lithotripsy of left renal calculi, laser incision of infundibular narrowing, placement of 6 French by 24 cm contour double-J stent without tether  Surgeon: Dylyn Mclaren  Anesthesia: General with LMA  Complications: None  Specimen: None  Estimated blood loss: None  Indications: 72 year old female with history of left renal carcinoma and recurrent urolithiasis.  Initial intervention was in January 2007 with left partial nephrectomy for stage T1 clear-cell carcinoma.  She underwent cryoablation of a recurrence in her left upper pole in 2020.  She has had recurrent urolithiasis and has had several interventions, starting in 2016.  Last intervention was in September 2022 with management of a large left lower pole branch stone.  She has had continued follow-up.  She has increasing stone burden in her left lower pole as well as a left renal pelvic stone.  She presents for ureteroscopic management of these.  Findings: Urothelium of the bladder was normal.  Ureteral orifices were normal bilaterally.  Retrograde study of the left ureter and kidney revealed a normal ureter without filling defect.  No significant pyelocaliectasis.  Moderate sized filling defect in the left renal pelvis consistent with the above-mentioned stone.  Multiple filling defects in the lower pole calyx.  Description of procedure: Patient properly identified and marked in the holding area.  She received preoperative IV antibiotics.  Taken to the operating room where general anesthetic was administered with the LMA.  Placed in the dorsolithotomy position.  Genitalia and perineum were prepped, draped, proper timeout performed.  21 French panendoscope advanced into the bladder.  Circumferential inspection performed with normal  findings.  Retrograde study of the left ureter and pyelocalyceal system carried out using Omnipaque and a 6 [place] open-ended catheter.  Above-mentioned findings noted.  Sensor tip guidewire advanced through the open-ended catheter with a curl seen in the upper pole calyceal system.  Cystoscope and open-ended catheter were then removed, sensor tip left indwelling.  Ureter dilated first with the inner core and then the entire 11/13, medium length ureteral access catheter.  Zip wire was left in as a safety wire with the access catheter.  Flexible dual-lumen digital ureteroscope was advanced into the pyelocalyceal system.  Circumferential inspection was performed.  No upper pole calculi noted.  Solitary renal pelvic stone noted as well as multiple small stones in the lower pole calyx.  There was infundibular stenosis which was incised using the holmium laser.  I then dusted the renal pelvic stone with the holmium laser.  365 m fiber was used.  Multiple small fragments, none of which were fit for extraction, produced.  Following incision of the narrowed infundibulum in the lower pole, I then dusted the multiple smaller stones that were in that 1 calyx.  No large stones were seen.  Most of these dust fragments were rinsed out of the calyx during the procedure.  I did my best to flush as many of these as possible out.  After finishing with the lithotripsy/dusting.  No significant stone burden was remaining in that lower pole calyx.  Most of the fragments were in the renal pelvis at this point.  I inspected each calyx.  No further stone burden was seen that needed treating.  At this point the scope was removed.  The zip wire was backloaded through the cystoscope and a 24 cm x 6 French contour double-J stent with tether was then placed.  Adequate proximal and distal curls were seen using fluoroscopy and cystoscopy, respectively.  The bladder was drained, the procedure terminated.  At this point the patient was awakened and  taken to the PACU in stable condition, having tolerated the procedure well.

## 2022-06-24 NOTE — Anesthesia Preprocedure Evaluation (Addendum)
Anesthesia Evaluation  Patient identified by MRN, date of birth, ID band Patient awake    Reviewed: Allergy & Precautions, NPO status , Patient's Chart, lab work & pertinent test results  Airway Mallampati: II  TM Distance: >3 FB Neck ROM: Full    Dental no notable dental hx.    Pulmonary neg pulmonary ROS   Pulmonary exam normal        Cardiovascular negative cardio ROS Normal cardiovascular exam     Neuro/Psych negative neurological ROS  negative psych ROS   GI/Hepatic Neg liver ROS,,,History of colon cancer   Endo/Other  NHL (non-Hodgkin's lymphoma)  Renal/GU Renal disease     Musculoskeletal  (+) Arthritis ,    Abdominal  (+) + obese  Peds  Hematology negative hematology ROS (+)   Anesthesia Other Findings RENAL CALCULI  Reproductive/Obstetrics                             Anesthesia Physical Anesthesia Plan  ASA: 2  Anesthesia Plan: General   Post-op Pain Management:    Induction: Intravenous  PONV Risk Score and Plan: 4 or greater and Ondansetron, Dexamethasone and Treatment may vary due to age or medical condition  Airway Management Planned: LMA  Additional Equipment:   Intra-op Plan:   Post-operative Plan: Extubation in OR  Informed Consent: I have reviewed the patients History and Physical, chart, labs and discussed the procedure including the risks, benefits and alternatives for the proposed anesthesia with the patient or authorized representative who has indicated his/her understanding and acceptance.     Dental advisory given  Plan Discussed with: CRNA  Anesthesia Plan Comments:        Anesthesia Quick Evaluation

## 2022-06-24 NOTE — Transfer of Care (Signed)
Immediate Anesthesia Transfer of Care Note  Patient: Nicole [name]  Procedure(s) Performed: CYSTOSCOPY WITH RETROGRADE PYELOGRAM, URETEROSCOPY AND STENT PLACEMENT (Left: Ureter) HOLMIUM LASER APPLICATION (Left: Ureter)  Patient Location: PACU  Anesthesia Type:General  Level of Consciousness: awake, alert , oriented, and patient cooperative  Airway & Oxygen Therapy: Patient Spontanous Breathing  Post-op Assessment: Report given to RN and Post -op Vital signs reviewed and stable  Post vital signs: Reviewed and stable  Last Vitals:  Vitals Value Taken Time  BP 165/76 06/24/22 0916  Temp    Pulse 92 06/24/22 0918  Resp 19 06/24/22 0918  SpO2 100 % 06/24/22 0918  Vitals shown include unvalidated device data.  Last Pain:  Vitals:   06/24/22 0710  TempSrc: Oral  PainSc: 0-No pain      Patients Stated Pain Goal: 6 (06/24/22 0710)  Complications: No notable events documented.

## 2022-06-24 NOTE — Anesthesia Postprocedure Evaluation (Signed)
Anesthesia Post Note  Patient: Nicole [name]  Procedure(s) Performed: CYSTOSCOPY WITH RETROGRADE PYELOGRAM, URETEROSCOPY AND STENT PLACEMENT (Left: Ureter) HOLMIUM LASER APPLICATION (Left: Ureter)     Patient location during evaluation: PACU Anesthesia Type: General Level of consciousness: awake Pain management: pain level controlled Vital Signs Assessment: post-procedure vital signs reviewed and stable Respiratory status: spontaneous breathing, nonlabored ventilation and respiratory function stable Cardiovascular status: blood pressure returned to baseline and stable Postop Assessment: no apparent nausea or vomiting Anesthetic complications: no   No notable events documented.  Last Vitals:  Vitals:   06/24/22 0930 06/24/22 0953  BP: (!) 149/80 (!) 174/82  Pulse:  78  Resp:    Temp: 36.5 C 36.5 C  SpO2:  100%    Last Pain:  Vitals:   06/24/22 0953  TempSrc: Oral  PainSc: 0-No pain                 Brenton Joines P [place]

## 2022-06-24 NOTE — Discharge Instructions (Addendum)
You may see some blood in the urine and may have some burning with urination for 48-72 hours. You also may notice that you have to urinate more frequently or urgently after your procedure which is normal.  You should call should you develop an inability urinate, fever > 101, persistent nausea and vomiting that prevents you from eating or drinking to stay hydrated.  If you have a stent, you will likely urinate more frequently and urgently until the stent is removed and you may experience some discomfort/pain in the lower abdomen and flank especially when urinating. You may take pain medication prescribed to you if needed for pain. You may also intermittently have blood in the urine until the stent is removed.  Post Anesthesia Home Care Instructions  Activity: Get plenty of rest for the remainder of the day. A responsible individual must stay with you for 24 hours following the procedure.  For the next 24 hours, DO NOT: -Drive a car -Advertising copywriter -Drink alcoholic beverages -Take any medication unless instructed by your physician -Make any legal decisions or sign important papers.  Meals: Start with liquid foods such as gelatin or soup. Progress to regular foods as tolerated. Avoid greasy, spicy, heavy foods. If nausea and/or vomiting occur, drink only clear liquids until the nausea and/or vomiting subsides. Call your physician if vomiting continues.  Special Instructions/Symptoms: Your throat may feel dry or sore from the anesthesia or the breathing tube placed in your throat during surgery. If this causes discomfort, gargle with warm salt water. The discomfort should disappear within 24 hours.   You were given Tylenol by mouth prior to your surgical procedure today. ( 07:44am) Please do not take any more Tylenol until after 2pm

## 2022-06-24 NOTE — Anesthesia Procedure Notes (Signed)
Procedure Name: LMA Insertion Date/Time: 06/24/2022 8:32 AM  Performed by: Bishop Limbo, CRNAPre-anesthesia Checklist: Patient identified, Emergency Drugs available, Suction available and Patient being monitored Patient Re-evaluated:Patient Re-evaluated prior to induction Oxygen Delivery Method: Circle System Utilized Preoxygenation: Pre-oxygenation with 100% oxygen Induction Type: IV induction Ventilation: Mask ventilation without difficulty LMA: LMA inserted LMA Size: 4.0 Number of attempts: 1 Placement Confirmation: positive ETCO2 Tube secured with: Tape Dental Injury: Teeth and Oropharynx as per pre-operative assessment

## 2022-06-27 ENCOUNTER — Encounter (HOSPITAL_BASED_OUTPATIENT_CLINIC_OR_DEPARTMENT_OTHER): Payer: Self-pay | Admitting: Urology

## 2022-07-08 NOTE — Progress Notes (Signed)
History of Present Illness: Nicole [name] is a 72 y.o. year old female here for followup of RCCa as well as urolithiasis.  Stone  disease:  She underwent left PCNL March, 2016 followed by left-sided ureteroscopy with laser and extraction of residual left renal calculi in April 16. 24-hour urine showed low magnesium and citrate level. Stone analysis revealed carbonate apatite.   9.15.2022: She underwent ureteroscopic management of large left lower pole branch stone with holmium laser.  Stent removed 3 weeks later   She is on potassium citrate 15 mEq tid--most of the time she just takes it twice a day, however.   4.8.2024: Lt URS w/ HLL of Lt renal stone burden --15 and 8 mm stones.     History of renal cell carcinoma: She is status post left partial nephrectomy 1.31.2007 for renal cell carcinoma. Final pathology revealed stage T1a clear cell carcinoma, Fuhrman nuclear grade 3/4. Tumor size was 1.9 cm.   CT A/P 1/18 revealed no evidence of recurrence.   1.6.2020: CT scan revealed enhancing 19 mm left upper pole renal mass consistent with recurrent renal cell carcinoma. There is stable retroperitoneal/peri aortic adenopathy. This was treated w/ cryoablation on 3.4.2020.   6.28.2022: She is followed by Dr. Ellin Saba.  She saw him in January of this year and got a clean bill of health.  She does have Lynch syndrome.    1.16.2024: Here for routine check.  No stone symptoms over the past year.  She had a CT in August 2023 revealing a branched stone in the lower pole of the left kidney as well as a 7 mm left renal pelvic stone. No evidence of RCCa.  4.8.2024: Underwent cystoscopy, left ureteroscopy with holmium laser dusting of left renal calculi, placement of double-J stent  4.23.2024: Here for recheck.  Occasional dysuria, mild hematuria.  No fever or chills, however.    Past Medical History:  Diagnosis Date   Arthritis    shorulder   B12 deficiency    Chronic diarrhea SECONDARY HX  COLON CANCER   History of colon cancer [facility] SYNDROME ---  COLORECTAL CANCER S/P COLECTOMY X3  LAST ONE 2007   NO RECURRENCE   History of colon cancer 05/04/2013   History of kidney infection 08/2013   History of kidney stones    History of MRSA infection 08/2013   left leg   Hx of osteopenia    Lynch syndrome 10/17/2010   Hereditary colon cancer   NHL (non-Hodgkin's lymphoma) 10/17/2010   MONITORED BY DR Mariel Sleet   Personal history of renal cancer S/P LEFT PARTIAL NEPHRECTOMY 2007--  NO RECURRENCE   DUE TO KIDNEY CANCER   Right shoulder pain S/P ROTATOR CUFF REPAIR    Past Surgical History:  Procedure Laterality Date   ABDOMINAL HYSTERECTOMY     APPENDECTOMY  1993   W/ LAVH   COLONOSCOPY W/ POLYPECTOMY     CYSTOSCOPY W/ RETROGRADES  07/14/2014   Procedure: CYSTOSCOPY WITH RETROGRADE PYELOGRAM;  Surgeon: Marcine Matar, MD;  Location: [facility];  Service: Urology;;   CYSTOSCOPY W/ URETERAL STENT REMOVAL Left 07/14/2014   Procedure: CYSTOSCOPY WITH STENT REMOVAL;  Surgeon: Marcine Matar, MD;  Location: [facility];  Service: Urology;  Laterality: Left;   CYSTOSCOPY WITH RETROGRADE PYELOGRAM, URETEROSCOPY AND STENT PLACEMENT Left 11/30/2020   Procedure: CYSTOSCOPY WITH RETROGRADE PYELOGRAM, URETEROSCOPY/STONE EXTRACTION AND STENT PLACEMENT;  Surgeon: Marcine Matar, MD;  Location: [facility];  Service: Urology;  Laterality: Left;   CYSTOSCOPY  WITH RETROGRADE PYELOGRAM, URETEROSCOPY AND STENT PLACEMENT Left 06/24/2022   Procedure: CYSTOSCOPY WITH RETROGRADE PYELOGRAM, URETEROSCOPY AND STENT PLACEMENT;  Surgeon: Marcine Matar, MD;  Location: [facility];  Service: Urology;  Laterality: Left;   CYSTOSCOPY WITH STENT PLACEMENT  02/07/2012   Procedure: CYSTOSCOPY WITH STENT PLACEMENT;  Surgeon: Marcine Matar, MD;  Location: [facility];  Service: Urology;  Laterality: Left;   CYSTOSCOPY WITH STENT  PLACEMENT Left 07/14/2014   Procedure: CYSTOSCOPY WITH STENT PLACEMENT;  Surgeon: Marcine Matar, MD;  Location: [facility];  Service: Urology;  Laterality: Left;   FLEXIBLE SIGMOIDOSCOPY  08/29/2011   Procedure: FLEXIBLE SIGMOIDOSCOPY;  Surgeon: Malissa Hippo, MD;  [facility];  Service: Endoscopy;  Laterality: N/A;  930   FLEXIBLE SIGMOIDOSCOPY N/A 09/28/2012   Procedure: FLEXIBLE SIGMOIDOSCOPY;  Surgeon: Malissa Hippo, MD;  [facility];  Service: Endoscopy;  Laterality: N/A;  730   FLEXIBLE SIGMOIDOSCOPY N/A 07/08/2014   Procedure: FLEXIBLE SIGMOIDOSCOPY;  Surgeon: Malissa Hippo, MD;  [facility];  Service: Endoscopy;  Laterality: N/A;  830 - moved to 10:20 - Ann to notify pt   FLEXIBLE SIGMOIDOSCOPY N/A 08/11/2015   Procedure: FLEXIBLE SIGMOIDOSCOPY;  Surgeon: Malissa Hippo, MD;  [facility];  Service: Endoscopy;  Laterality: N/A;  730   FLEXIBLE SIGMOIDOSCOPY N/A 08/13/2016   Procedure: FLEXIBLE SIGMOIDOSCOPY;  Surgeon: Malissa Hippo, MD;  [facility];  Service: Endoscopy;  Laterality: N/A;  730   FLEXIBLE SIGMOIDOSCOPY N/A 10/06/2019   Procedure: FLEXIBLE SIGMOIDOSCOPY;  Surgeon: Malissa Hippo, MD;  [facility];  Service: Endoscopy;  Laterality: N/A;  1245   HEMICOLECTOMY  2001   right   HEMICOLECTOMY  2002   left   HOLMIUM LASER APPLICATION Left 07/14/2014   Procedure: HOLMIUM LASER LITHOTRIPSY,;  Surgeon: Marcine Matar, MD;  Location: [facility];  Service: Urology;  Laterality: Left;   HOLMIUM LASER APPLICATION Left 11/30/2020   Procedure: HOLMIUM LASER APPLICATION;  Surgeon: Marcine Matar, MD;  Location: [facility];  Service: Urology;  Laterality: Left;   HOLMIUM LASER APPLICATION Left 06/24/2022   Procedure: HOLMIUM LASER APPLICATION;  Surgeon: Marcine Matar, MD;  Location: [facility];  Service: Urology;  Laterality: Left;   IR  RADIOLOGIST EVAL & MGMT  04/29/2018   IR RADIOLOGIST EVAL & MGMT  10/07/2019   IR RADIOLOGIST EVAL & MGMT  10/11/2020   IR RADIOLOGIST EVAL & MGMT  11/08/2021   kidney resection  2007   left partial   LAPAROSCOPIC ASSISTED VAGINAL HYSTERECTOMY  1993   W/ BILATERAL SALPINGO-OOPHORECTOMY   LEFT FLANK EXPLORATION W/  PARTIAL LEFT NEPHRECTOMY/ LEFT HILAR LYMPH NODE BX  04-17-2005  DR Erinn Mendosa   RENAL CELL CARCINOMA   LITHOTRIPSY  11-22013   x2   NEPHROLITHOTOMY Left 06/09/2014   Procedure: NEPHROLITHOTOMY PERCUTANEOUS;  Surgeon: Marcine Matar, MD;  Location: [facility];  Service: Urology;  Laterality: Left;  with STENT   RADIOLOGY WITH ANESTHESIA Left 05/20/2018   Procedure: CT WITH ANESTHESIA LEFT RENAL CRYOABLATION AND BIOPSY;  Surgeon: Berdine Dance, MD;  Location: [facility];  Service: Anesthesiology;  Laterality: Left;   RIGHT ROTATOR  CUFF REPAIR/   BICEP REPAIR  10-17-2011  DR SUPPLE   SUBTOTAL COLECTOMY  2007   URETEROSCOPY WITH HOLMIUM LASER LITHOTRIPSY Left 07/14/2014   Procedure: URETEROSCOPY  EXTRACTION OF STONES;  Surgeon: Marcine Matar, MD;  Location: [facility];  Service: Urology;  Laterality: Left;    Home Medications:  (Not in a hospital admission)   Allergies:  Allergies  Allergen Reactions   Latex Rash   Morphine And Related Other (See Comments)    hallucinations   Tape Rash    Certain tapes     Family History  Problem Relation Age of Onset   Cancer Son     Social History:  reports that she has never smoked. She has never used smokeless tobacco. She reports current alcohol use. She reports that she does not use drugs.  ROS: A complete review of systems was performed.  All systems are negative except for pertinent findings as noted.  Physical Exam:  Vital signs in last 24 hours: @VSRANGES @ General:  Alert and oriented, No acute distress HEENT: Normocephalic, atraumatic Neck: No JVD or lymphadenopathy Cardiovascular: Regular rate  Lungs:  Normal inspiratory/expiratory excursion Abdomen: Soft, nontender, nondistended, no abdominal masses Back: No CVA tenderness Extremities: No edema Neurologic: Grossly intact  I have reviewed prior pt note  I have reviewed urinalysis results  I have independently reviewed prior imaging--KUB from earlier today reviewed.  Still with some stone matter, most likely dust, remaining in lower pole calyces.   Cystoscopy, left double-J stent extraction  After sterile prep, flexible cystoscope placed in the bladder.  Circumferential inspection revealed normal urothelium.  Stent present at the left ureteral orifice.  This was grasped with a grasping instrument and removed without difficulty.  Procedure covered with Cipro.  Impression/Assessment:  History of left renal calculi, status post recent ureteroscopic management  Plan:  I will see back in 3 months with KUB  [name] Kimeka Badour 07/08/2022, 11:35 AM  [name]. Zakarie Sturdivant MD

## 2022-07-09 ENCOUNTER — Telehealth: Payer: Self-pay

## 2022-07-09 ENCOUNTER — Ambulatory Visit (INDEPENDENT_AMBULATORY_CARE_PROVIDER_SITE_OTHER): Payer: PPO | Admitting: Urology

## 2022-07-09 ENCOUNTER — Ambulatory Visit (HOSPITAL_COMMUNITY)
Admission: RE | Admit: 2022-07-09 | Discharge: 2022-07-09 | Disposition: A | Discharge status: Home / Self Care | Payer: PPO | Source: Ambulatory Visit | Attending: Urology | Admitting: Urology

## 2022-07-09 VITALS — BP 158/88 | HR 72

## 2022-07-09 DIAGNOSIS — Z466 Encounter for fitting and adjustment of urinary device: Secondary | ICD-10-CM | POA: Diagnosis not present

## 2022-07-09 DIAGNOSIS — R319 Hematuria, unspecified: Secondary | ICD-10-CM | POA: Diagnosis not present

## 2022-07-09 DIAGNOSIS — N200 Calculus of kidney: Secondary | ICD-10-CM | POA: Diagnosis present

## 2022-07-09 DIAGNOSIS — Z85528 Personal history of other malignant neoplasm of kidney: Secondary | ICD-10-CM

## 2022-07-09 DIAGNOSIS — R300 Dysuria: Secondary | ICD-10-CM | POA: Diagnosis not present

## 2022-07-09 DIAGNOSIS — N390 Urinary tract infection, site not specified: Secondary | ICD-10-CM

## 2022-07-09 DIAGNOSIS — Z87442 Personal history of urinary calculi: Secondary | ICD-10-CM

## 2022-07-09 LAB — MICROSCOPIC EXAMINATION: RBC, Urine: >30 /hpf — AB (ref 0–2)

## 2022-07-09 LAB — URINALYSIS, ROUTINE W REFLEX MICROSCOPIC
Bilirubin, UA: NEGATIVE
Glucose, UA: NEGATIVE
Ketones, UA: NEGATIVE
Nitrite, UA: NEGATIVE
Specific Gravity, UA: 1.025 (ref 1.005–1.030)
Urobilinogen, Ur: 0.2 mg/dL (ref 0.2–1.0)
pH, UA: 5.5 (ref 5.0–7.5)

## 2022-07-09 MED ORDER — CIPROFLOXACIN HCL 500 MG PO TABS
500.0000 mg | ORAL_TABLET | Freq: Once | ORAL | Status: AC
Start: 2022-07-09 — End: 2022-07-09
  Administered 2022-07-09: 500 mg via ORAL

## 2022-07-09 NOTE — Telephone Encounter (Signed)
Patient called to confirm her apt this afternoon and to see if she needed to have a KUB done prior to apt.  Verbal from Dr. Retta Diones to order KUB and for pt to have before her apt.  Orders in and patient aware.

## 2022-08-08 DIAGNOSIS — D2370 Other benign neoplasm of skin of unspecified lower limb, including hip: Secondary | ICD-10-CM | POA: Insufficient documentation

## 2022-09-12 IMAGING — CR DG ABDOMEN 1V
1 series · 1 of 1 positions shown · non-contrast
Comparison: December 19, 2020

CLINICAL DATA: History of renal stones.

EXAM:
ABDOMEN - 1 VIEW

[t ap supine]
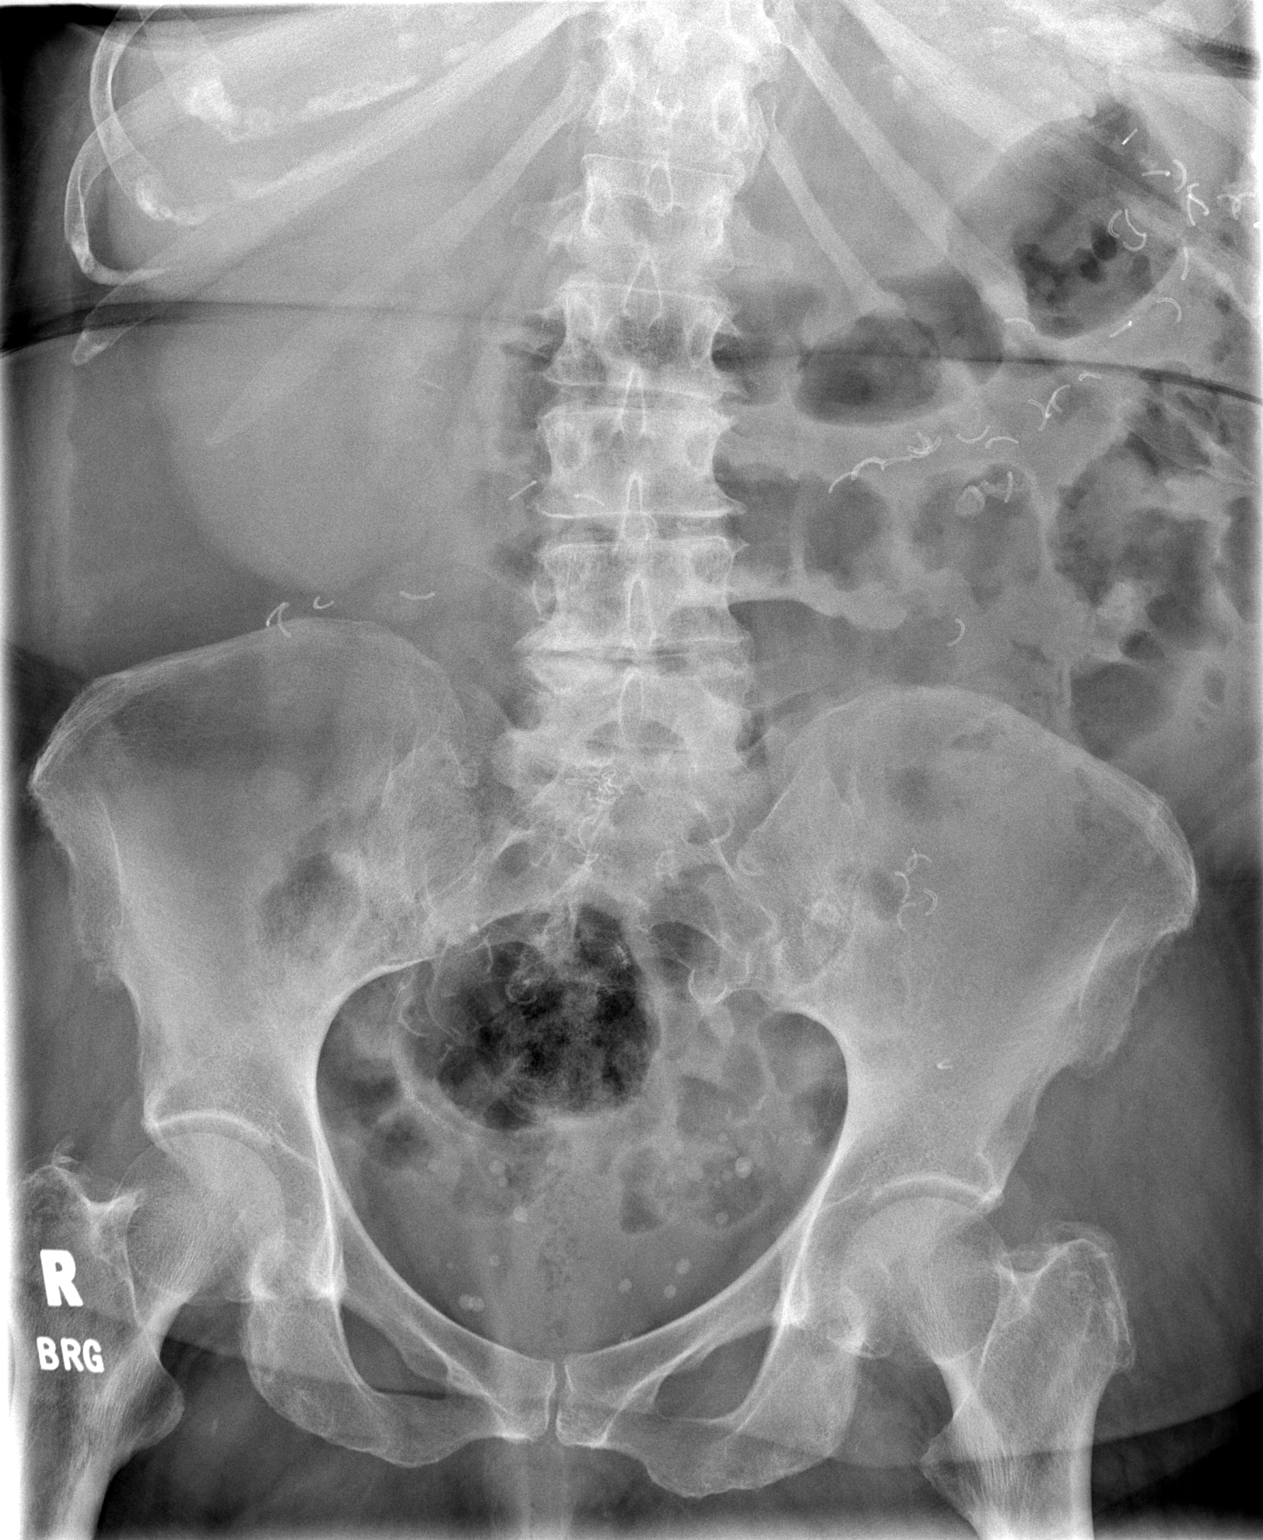

[1 of 1 positions shown; findings below may reference images not displayed]

FINDINGS: The bowel gas pattern is normal. The left-sided endo ureteral stent
seen on the prior study has been removed. Stable 12 mm and 8 mm soft
tissue calcifications are seen projecting over the left kidney.
Numerous radiopaque surgical clips are seen within the mid right
abdomen and scattered throughout the left abdomen. Multiple
phleboliths are noted within the lower pelvis.
IMPRESSION: 1. Stable left renal calculi.
2. Interval left-sided endo ureteral stent removal since the prior
study.

## 2022-09-21 ENCOUNTER — Ambulatory Visit
Admission: EM | Admit: 2022-09-21 | Discharge: 2022-09-21 | Disposition: A | Discharge status: Home / Self Care | Payer: PPO | Source: Home / Self Care

## 2022-09-21 ENCOUNTER — Ambulatory Visit: Admission: EM | Admit: 2022-09-21 | Discharge: 2022-09-21 | Discharge status: Against Medical Advice | Payer: PPO

## 2022-09-21 ENCOUNTER — Encounter: Payer: Self-pay | Admitting: Emergency Medicine

## 2022-09-21 DIAGNOSIS — R21 Rash and other nonspecific skin eruption: Secondary | ICD-10-CM | POA: Diagnosis not present

## 2022-09-21 HISTORY — DX: Unspecified malignant neoplasm of skin, unspecified: C44.90

## 2022-09-21 MED ORDER — PREDNISONE 20 MG PO TABS: 40.0000 mg | ORAL_TABLET | Freq: Every day | ORAL | 0 refills | Status: AC

## 2022-09-21 MED ORDER — TRIAMCINOLONE ACETONIDE 0.1 % EX CREA: 1.0000 | TOPICAL_CREAM | Freq: Two times a day (BID) | CUTANEOUS | 0 refills | Status: DC

## 2022-09-21 MED ORDER — DEXAMETHASONE SODIUM PHOSPHATE 10 MG/ML IJ SOLN
10.0000 mg | INTRAMUSCULAR | Status: AC
  Administered 2022-09-21: 10 mg via INTRAMUSCULAR

## 2022-09-21 NOTE — Discharge Instructions (Addendum)
You were given an injection of Decadron 10 mg to help with the itching. Take medication as prescribed. May take over-the-counter Zyrtec during the day and Benadryl at bedtime as needed for itching. Avoid hot baths or showers while symptoms persist.  Recommend taking lukewarm baths. May apply cool cloths to the area to help with itching or discomfort. Avoid scratching, rubbing, or manipulating the areas while symptoms persist. Recommend Aveeno colloidal oatmeal bath to use to help with drying and itching. Follow up if symptoms do not improve.

## 2022-09-21 NOTE — ED Triage Notes (Signed)
Rash on forearms since Thursday and a few areas on lower legs.  Has tried hydrocortisone cream and it burned the areas.  States she cut bushes on Wednesday.

## 2022-09-21 NOTE — ED Provider Notes (Signed)
[facility]    CSN: [id number] Arrival date & time: 09/21/22  0932      History   Chief Complaint No chief complaint on file.   HPI Nicole [name] is a 72 y.o. female.   The history is provided by the patient.   The patient presents for complaints of rash to her bilateral forearms and bilateral lower legs that started over the last 2 to 3 days.  Patient states that she thinks that she and her granddaughter came into contact with the bluish.  She states that her granddaughter has the same or similar rash.  Patient denies exposure to new soaps, foods, medications, lotions, or detergents.  She further denies fever, chills, chest pain, abdominal pain, oozing, or drainage from the sites.  Patient states that she attempted to use hydrocortisone cream, but it caused the areas to burn.  She states since that time she has been using baking soda paste on the areas.  Past Medical History:  Diagnosis Date   Arthritis    shorulder   B12 deficiency    Chronic diarrhea SECONDARY HX COLON CANCER   History of colon cancer [facility] SYNDROME ---  COLORECTAL CANCER S/P COLECTOMY X3  LAST ONE 2007   NO RECURRENCE   History of colon cancer 05/04/2013   History of kidney infection 08/2013   History of kidney stones    History of MRSA infection 08/2013   left leg   Hx of osteopenia    Lynch syndrome 10/17/2010   Hereditary colon cancer   NHL (non-Hodgkin's lymphoma) (HCC) 10/17/2010   MONITORED BY DR Mariel Sleet   Personal history of renal cancer S/P LEFT PARTIAL NEPHRECTOMY 2007--  NO RECURRENCE   DUE TO KIDNEY CANCER   Right shoulder pain S/P ROTATOR CUFF REPAIR   Skin cancer     Patient Active Problem List   Diagnosis Date Noted   Pain of left heel 05/25/2019   Left renal mass 05/20/2018   Renal mass, left 05/20/2018   Hx of colon cancer, stage I 08/07/2016   Renal stones 06/09/2014   Renal calculi    History of colon cancer 05/04/2013   Pain in joint, shoulder region  10/28/2011   Muscle weakness (generalized) 10/28/2011   Rotator cuff tear, right 10/17/2011   Lynch syndrome 10/17/2010   Renal cell carcinoma (HCC) 10/17/2010   NHL (non-Hodgkin's lymphoma) (HCC) 10/17/2010   Obesity 10/17/2010   B12 deficiency 10/17/2010    Past Surgical History:  Procedure Laterality Date   ABDOMINAL HYSTERECTOMY     APPENDECTOMY  1993   W/ LAVH   COLONOSCOPY W/ POLYPECTOMY     CYSTOSCOPY W/ RETROGRADES  07/14/2014   Procedure: CYSTOSCOPY WITH RETROGRADE PYELOGRAM;  Surgeon: Marcine Matar, MD;  Location: [facility];  Service: Urology;;   CYSTOSCOPY W/ URETERAL STENT REMOVAL Left 07/14/2014   Procedure: CYSTOSCOPY WITH STENT REMOVAL;  Surgeon: Marcine Matar, MD;  Location: [facility];  Service: Urology;  Laterality: Left;   CYSTOSCOPY WITH RETROGRADE PYELOGRAM, URETEROSCOPY AND STENT PLACEMENT Left 11/30/2020   Procedure: CYSTOSCOPY WITH RETROGRADE PYELOGRAM, URETEROSCOPY/STONE EXTRACTION AND STENT PLACEMENT;  Surgeon: Marcine Matar, MD;  Location: [facility];  Service: Urology;  Laterality: Left;   CYSTOSCOPY WITH RETROGRADE PYELOGRAM, URETEROSCOPY AND STENT PLACEMENT Left 06/24/2022   Procedure: CYSTOSCOPY WITH RETROGRADE PYELOGRAM, URETEROSCOPY AND STENT PLACEMENT;  Surgeon: Marcine Matar, MD;  Location[facility];  Service: Urology;  Laterality: Left;   CYSTOSCOPY WITH STENT PLACEMENT  02/07/2012   Procedure: CYSTOSCOPY WITH STENT PLACEMENT;  Surgeon: Marcine Matar, MD;  Location: [facility];  Service: Urology;  Laterality: Left;   CYSTOSCOPY WITH STENT PLACEMENT Left 07/14/2014   Procedure: CYSTOSCOPY WITH STENT PLACEMENT;  Surgeon: Marcine Matar, MD;  Location: [facility];  Service: Urology;  Laterality: Left;   FLEXIBLE SIGMOIDOSCOPY  08/29/2011   Procedure: FLEXIBLE SIGMOIDOSCOPY;  Surgeon: Malissa Hippo, MD;  [facility];  Service:  Endoscopy;  Laterality: N/A;  930   FLEXIBLE SIGMOIDOSCOPY N/A 09/28/2012   Procedure: FLEXIBLE SIGMOIDOSCOPY;  Surgeon: Malissa Hippo, MD;  [facility];  Service: Endoscopy;  Laterality: N/A;  730   FLEXIBLE SIGMOIDOSCOPY N/A 07/08/2014   Procedure: FLEXIBLE SIGMOIDOSCOPY;  Surgeon: Malissa Hippo, MD;  [facility];  Service: Endoscopy;  Laterality: N/A;  830 - moved to 10:20 - Ann to notify pt   FLEXIBLE SIGMOIDOSCOPY N/A 08/11/2015   Procedure: FLEXIBLE SIGMOIDOSCOPY;  Surgeon: Malissa Hippo, MD;  [facility];  Service: Endoscopy;  Laterality: N/A;  730   FLEXIBLE SIGMOIDOSCOPY N/A 08/13/2016   Procedure: FLEXIBLE SIGMOIDOSCOPY;  Surgeon: Malissa Hippo, MD;  [facility];  Service: Endoscopy;  Laterality: N/A;  730   FLEXIBLE SIGMOIDOSCOPY N/A 10/06/2019   Procedure: FLEXIBLE SIGMOIDOSCOPY;  Surgeon: Malissa Hippo, MD;  [facility];  Service: Endoscopy;  Laterality: N/A;  1245   HEMICOLECTOMY  2001   right   HEMICOLECTOMY  2002   left   HOLMIUM LASER APPLICATION Left 07/14/2014   Procedure: HOLMIUM LASER LITHOTRIPSY,;  Surgeon: Marcine Matar, MD;  Location: [facility];  Service: Urology;  Laterality: Left;   HOLMIUM LASER APPLICATION Left 11/30/2020   Procedure: HOLMIUM LASER APPLICATION;  Surgeon: Marcine Matar, MD;  Location: [facility];  Service: Urology;  Laterality: Left;   HOLMIUM LASER APPLICATION Left 06/24/2022   Procedure: HOLMIUM LASER APPLICATION;  Surgeon: Marcine Matar, MD;  Location: [facility];  Service: Urology;  Laterality: Left;   IR RADIOLOGIST EVAL & MGMT  04/29/2018   IR RADIOLOGIST EVAL & MGMT  10/07/2019   IR RADIOLOGIST EVAL & MGMT  10/11/2020   IR RADIOLOGIST EVAL & MGMT  11/08/2021   kidney resection  2007   left partial   LAPAROSCOPIC ASSISTED VAGINAL HYSTERECTOMY  1993   W/ BILATERAL SALPINGO-OOPHORECTOMY   LEFT FLANK EXPLORATION W/   PARTIAL LEFT NEPHRECTOMY/ LEFT HILAR LYMPH NODE BX  04-17-2005  DR DAHLSTEDT   RENAL CELL CARCINOMA   LITHOTRIPSY  11-22013   x2   NEPHROLITHOTOMY Left 06/09/2014   Procedure: NEPHROLITHOTOMY PERCUTANEOUS;  Surgeon: Marcine Matar, MD;  Location: [facility];  Service: Urology;  Laterality: Left;  with STENT   RADIOLOGY WITH ANESTHESIA Left 05/20/2018   Procedure: CT WITH ANESTHESIA LEFT RENAL CRYOABLATION AND BIOPSY;  Surgeon: Berdine Dance, MD;  Location: [facility];  Service: Anesthesiology;  Laterality: Left;   RIGHT ROTATOR  CUFF REPAIR/   BICEP REPAIR  10-17-2011  DR SUPPLE   SUBTOTAL COLECTOMY  2007   URETEROSCOPY WITH HOLMIUM LASER LITHOTRIPSY Left 07/14/2014   Procedure: URETEROSCOPY  EXTRACTION OF STONES;  Surgeon: Marcine Matar, MD;  Location: [facility];  Service: Urology;  Laterality: Left;    OB History   No obstetric history on file.      Home Medications    Prior to Admission medications   Medication Sig Start Date End Date Taking? Authorizing Provider  predniSONE (DELTASONE) 20 MG tablet Take 2 tablets (40 mg total) by mouth daily with breakfast for 5 days. 09/21/22 09/26/22 Yes Franki Alcaide-Warren, Sadie Haber, NP  triamcinolone cream (KENALOG) 0.1 % Apply 1 Application topically 2 (two) times daily. 09/21/22  Yes Martavion Couper-Warren, Sadie Haber, NP  Cholecalciferol (VITAMIN D3) 125 MCG (5000 UT) CAPS Take 5,000 Units by mouth daily.    [provider]  cyanocobalamin (,VITAMIN B-12,) 1000 MCG/ML injection ADMINISTER 1 ML(1000 MCG) IN THE MUSCLE EVERY 21 DAYS 09/19/21   Doreatha Massed, MD  diphenoxylate-atropine (LOMOTIL) 2.5-0.025 MG tablet Take 1 tablet by mouth 4 (four) times daily as needed for diarrhea or loose stools. 11/05/21   Doreatha Massed, MD  furosemide (LASIX) 40 MG tablet Take 40 mg by mouth daily as needed for fluid.     [provider]  ibuprofen (ADVIL,MOTRIN) 800 MG tablet Take 800 mg by mouth 3 (three) times daily as needed for  moderate pain. 08/01/16   [provider]  magnesium oxide (MAG-OX) 400 (240 Mg) MG tablet Take 400 mg by mouth daily.    [provider]  mirabegron ER (MYRBETRIQ) 50 MG TB24 tablet Take 1 tablet (50 mg total) by mouth daily. 06/24/22   Marcine Matar, MD  Potassium Citrate 15 MEQ (1620 MG) TBCR Take 1 tablet by mouth 3 (three) times daily. 11/14/20   Marcine Matar, MD    Family History Family History  Problem Relation Age of Onset   Cancer Son     Social History Social History   Tobacco Use   Smoking status: Never   Smokeless tobacco: Never  Vaping Use   Vaping Use: Never used  Substance Use Topics   Alcohol use: Yes    Comment: 2 glasses of wine per year - maybe   Drug use: No     Allergies   Latex, Morphine and codeine, and Tape   Review of Systems Review of Systems Per HPI  Physical Exam Triage Vital Signs ED Triage Vitals  Enc Vitals Group     BP 09/21/22 0935 (!) 172/92     Pulse Rate 09/21/22 0935 78     Resp 09/21/22 0935 18     Temp 09/21/22 0935 97.9 F (36.6 C)     Temp Source 09/21/22 0935 Oral     SpO2 09/21/22 0935 95 %     Weight --      Height --      Head Circumference --      Peak Flow --      Pain Score 09/21/22 0937 7     Pain Loc --      Pain Edu? --      Excl. in GC? --    No data found.  Updated Vital Signs BP (!) 172/92 (BP Location: Right Arm)   Pulse 78   Temp 97.9 F (36.6 C) (Oral)   Resp 18   SpO2 95%   Visual Acuity Right Eye Distance:   Left Eye Distance:   Bilateral Distance:    Right Eye Near:   Left Eye Near:    Bilateral Near:     Physical Exam Vitals and nursing note reviewed.  Constitutional:      General: She is not in acute distress.    Appearance: Normal appearance.  HENT:     Head: Normocephalic.  Eyes:     Extraocular Movements: Extraocular movements intact.     Pupils: Pupils are equal, round, and reactive to light.  Cardiovascular:  Rate and Rhythm: Normal rate  and regular rhythm.     Pulses: Normal pulses.     Heart sounds: Normal heart sounds.  Pulmonary:     Effort: Pulmonary effort is normal.     Breath sounds: Normal breath sounds.  Abdominal:     General: Bowel sounds are normal.     Palpations: Abdomen is soft.  Musculoskeletal:     Cervical back: Normal range of motion.  Skin:    Findings: Rash present. Rash is macular and papular.     Comments: Maculopapular rash noted to the bilateral forearms and bilateral lower legs.  Rash is maculopapular in nature, there is no congruent pattern, areas are erythematous and slightly raised.  There is no oozing, fluctuance, or drainage present.  Neurological:     General: No focal deficit present.     Mental Status: She is alert and oriented to person, place, and time.  Psychiatric:        Mood and Affect: Mood normal.        Behavior: Behavior normal.      UC Treatments / Results  Labs (all labs ordered are listed, but only abnormal results are displayed) Labs Reviewed - No data to display  EKG   Radiology No results found.  Procedures Procedures (including critical care time)  Medications Ordered in UC Medications  dexamethasone (DECADRON) injection 10 mg (10 mg Intramuscular Given 09/21/22 1001)    Initial Impression / Assessment and Plan / UC Course  I have reviewed the triage vital signs and the nursing notes.  Pertinent labs & imaging results that were available during my care of the patient were reviewed by me and considered in my medical decision making (see chart for details).  The patient is well-appearing, she is in no acute distress, vital signs are stable.  Patient appears to have come into contact with poison ivy.  Rash is maculopapular in nature, and localized to the bilateral forearms and bilateral lower legs with no congruent pattern.  Decadron 10 mg IM was administered in the clinic.  Patient was prescribed prednisone 40 mg for the next 5 days along with  triamcinolone cream 0.1% to apply topically as needed for itching.  Supportive care recommendations were provided and discussed with the patient to include avoidance of hot baths or showers, avoiding scratching or irritating the areas, and use of over-the-counter antihistamines for itching.  Patient advised to follow-up with her PCP if symptoms do not improve.  Patient was in agreement with this plan of care and verbalizes understanding.  All questions were answered.  Patient stable for discharge.  Final Clinical Impressions(s) / UC Diagnoses   Final diagnoses:  Rash and nonspecific skin eruption     Discharge Instructions      You were given an injection of Decadron 10 mg to help with the itching. Take medication as prescribed. May take over-the-counter Zyrtec during the day and Benadryl at bedtime as needed for itching. Avoid hot baths or showers while symptoms persist.  Recommend taking lukewarm baths. May apply cool cloths to the area to help with itching or discomfort. Avoid scratching, rubbing, or manipulating the areas while symptoms persist. Recommend Aveeno colloidal oatmeal bath to use to help with drying and itching. Follow up if symptoms do not improve.      ED Prescriptions     Medication Sig Dispense Auth. Provider   predniSONE (DELTASONE) 20 MG tablet Take 2 tablets (40 mg total) by mouth daily with breakfast for 5  days. 10 tablet Rudolph Dobler-Warren, Sadie Haber, NP   triamcinolone cream (KENALOG) 0.1 % Apply 1 Application topically 2 (two) times daily. 45 g Rowynn Mcweeney-Warren, Sadie Haber, NP      PDMP not reviewed this encounter.   Abran Cantor, NP 09/21/22 1003

## 2022-09-29 ENCOUNTER — Ambulatory Visit
Admission: EM | Admit: 2022-09-29 | Discharge: 2022-09-29 | Disposition: A | Discharge status: Home / Self Care | Payer: PPO | Attending: Family Medicine | Admitting: Family Medicine

## 2022-09-29 DIAGNOSIS — L255 Unspecified contact dermatitis due to plants, except food: Secondary | ICD-10-CM | POA: Diagnosis not present

## 2022-09-29 MED ORDER — PREDNISONE 10 MG PO TABS: ORAL_TABLET | ORAL | 0 refills | Status: DC

## 2022-09-29 NOTE — ED Provider Notes (Signed)
[facility]    CSN: [id number] Arrival date & time: 09/29/22  1158      History   Chief Complaint No chief complaint on file.   HPI Nicole [name] is a 72 y.o. female.   Presenting today with 3-day history of recurring and worsening itchy rash that is suspected to be poison oak.  Has already been treated a week or so ago with a steroid shot and prednisone burst which she states initially cleared it up but since being off of the prednisone it has returned and worsened.  Denies any new exposures, new medications or foods and no throat itching or swelling, chest tightness, shortness of breath.  Not tried anything over-the-counter currently.    Past Medical History:  Diagnosis Date   Arthritis    shorulder   B12 deficiency    Chronic diarrhea SECONDARY HX COLON CANCER   History of colon cancer [facility] SYNDROME ---  COLORECTAL CANCER S/P COLECTOMY X3  LAST ONE 2007   NO RECURRENCE   History of colon cancer 05/04/2013   History of kidney infection 08/2013   History of kidney stones    History of MRSA infection 08/2013   left leg   Hx of osteopenia    Lynch syndrome 10/17/2010   Hereditary colon cancer   NHL (non-Hodgkin's lymphoma) (HCC) 10/17/2010   MONITORED BY DR Mariel Sleet   Personal history of renal cancer S/P LEFT PARTIAL NEPHRECTOMY 2007--  NO RECURRENCE   DUE TO KIDNEY CANCER   Right shoulder pain S/P ROTATOR CUFF REPAIR   Skin cancer     Patient Active Problem List   Diagnosis Date Noted   Pain of left heel 05/25/2019   Left renal mass 05/20/2018   Renal mass, left 05/20/2018   Hx of colon cancer, stage I 08/07/2016   Renal stones 06/09/2014   Renal calculi    History of colon cancer 05/04/2013   Pain in joint, shoulder region 10/28/2011   Muscle weakness (generalized) 10/28/2011   Rotator cuff tear, right 10/17/2011   Lynch syndrome 10/17/2010   Renal cell carcinoma (HCC) 10/17/2010   NHL (non-Hodgkin's lymphoma) (HCC) 10/17/2010   Obesity  10/17/2010   B12 deficiency 10/17/2010    Past Surgical History:  Procedure Laterality Date   ABDOMINAL HYSTERECTOMY     APPENDECTOMY  1993   W/ LAVH   COLONOSCOPY W/ POLYPECTOMY     CYSTOSCOPY W/ RETROGRADES  07/14/2014   Procedure: CYSTOSCOPY WITH RETROGRADE PYELOGRAM;  Surgeon: Marcine Matar, MD;  Location: [facility];  Service: Urology;;   CYSTOSCOPY W/ URETERAL STENT REMOVAL Left 07/14/2014   Procedure: CYSTOSCOPY WITH STENT REMOVAL;  Surgeon: Marcine Matar, MD;  Location: [facility];  Service: Urology;  Laterality: Left;   CYSTOSCOPY WITH RETROGRADE PYELOGRAM, URETEROSCOPY AND STENT PLACEMENT Left 11/30/2020   Procedure: CYSTOSCOPY WITH RETROGRADE PYELOGRAM, URETEROSCOPY/STONE EXTRACTION AND STENT PLACEMENT;  Surgeon: Marcine Matar, MD;  Location: [facility];  Service: Urology;  Laterality: Left;   CYSTOSCOPY WITH RETROGRADE PYELOGRAM, URETEROSCOPY AND STENT PLACEMENT Left 06/24/2022   Procedure: CYSTOSCOPY WITH RETROGRADE PYELOGRAM, URETEROSCOPY AND STENT PLACEMENT;  Surgeon: Marcine Matar, MD;  Location: [facility];  Service: Urology;  Laterality: Left;   CYSTOSCOPY WITH STENT PLACEMENT  02/07/2012   Procedure: CYSTOSCOPY WITH STENT PLACEMENT;  Surgeon: Marcine Matar, MD;  Location: [facility];  Service: Urology;  Laterality: Left;   CYSTOSCOPY WITH STENT PLACEMENT Left 07/14/2014   Procedure: CYSTOSCOPY WITH STENT PLACEMENT;  Surgeon: Marcine Matar, MD;  Location: [facility];  Service: Urology;  Laterality: Left;   FLEXIBLE SIGMOIDOSCOPY  08/29/2011   Procedure: FLEXIBLE SIGMOIDOSCOPY;  Surgeon: Malissa Hippo, MD;  [facility];  Service: Endoscopy;  Laterality: N/A;  930   FLEXIBLE SIGMOIDOSCOPY N/A 09/28/2012   Procedure: FLEXIBLE SIGMOIDOSCOPY;  Surgeon: Malissa Hippo, MD;  [facility];  Service: Endoscopy;  Laterality: N/A;  730   FLEXIBLE  SIGMOIDOSCOPY N/A 07/08/2014   Procedure: FLEXIBLE SIGMOIDOSCOPY;  Surgeon: Malissa Hippo, MD;  [facility];  Service: Endoscopy;  Laterality: N/A;  830 - moved to 10:20 - Ann to notify pt   FLEXIBLE SIGMOIDOSCOPY N/A 08/11/2015   Procedure: FLEXIBLE SIGMOIDOSCOPY;  Surgeon: Malissa Hippo, MD;  [facility];  Service: Endoscopy;  Laterality: N/A;  730   FLEXIBLE SIGMOIDOSCOPY N/A 08/13/2016   Procedure: FLEXIBLE SIGMOIDOSCOPY;  Surgeon: Malissa Hippo, MD;  [facility];  Service: Endoscopy;  Laterality: N/A;  730   FLEXIBLE SIGMOIDOSCOPY N/A 10/06/2019   Procedure: FLEXIBLE SIGMOIDOSCOPY;  Surgeon: Malissa Hippo, MD;  [facility];  Service: Endoscopy;  Laterality: N/A;  1245   HEMICOLECTOMY  2001   right   HEMICOLECTOMY  2002   left   HOLMIUM LASER APPLICATION Left 07/14/2014   Procedure: HOLMIUM LASER LITHOTRIPSY,;  Surgeon: Marcine Matar, MD;  Location: [facility];  Service: Urology;  Laterality: Left;   HOLMIUM LASER APPLICATION Left 11/30/2020   Procedure: HOLMIUM LASER APPLICATION;  Surgeon: Marcine Matar, MD;  Location: [facility];  Service: Urology;  Laterality: Left;   HOLMIUM LASER APPLICATION Left 06/24/2022   Procedure: HOLMIUM LASER APPLICATION;  Surgeon: Marcine Matar, MD;  Location: [facility];  Service: Urology;  Laterality: Left;   IR RADIOLOGIST EVAL & MGMT  04/29/2018   IR RADIOLOGIST EVAL & MGMT  10/07/2019   IR RADIOLOGIST EVAL & MGMT  10/11/2020   IR RADIOLOGIST EVAL & MGMT  11/08/2021   kidney resection  2007   left partial   LAPAROSCOPIC ASSISTED VAGINAL HYSTERECTOMY  1993   W/ BILATERAL SALPINGO-OOPHORECTOMY   LEFT FLANK EXPLORATION W/  PARTIAL LEFT NEPHRECTOMY/ LEFT HILAR LYMPH NODE BX  04-17-2005  DR DAHLSTEDT   RENAL CELL CARCINOMA   LITHOTRIPSY  11-22013   x2   NEPHROLITHOTOMY Left 06/09/2014   Procedure: NEPHROLITHOTOMY PERCUTANEOUS;  Surgeon: Marcine Matar, MD;  Location: [facility];  Service: Urology;  Laterality: Left;  with STENT   RADIOLOGY WITH ANESTHESIA Left 05/20/2018   Procedure: CT WITH ANESTHESIA LEFT RENAL CRYOABLATION AND BIOPSY;  Surgeon: Berdine Dance, MD;  Location: [facility];  Service: Anesthesiology;  Laterality: Left;   RIGHT ROTATOR  CUFF REPAIR/   BICEP REPAIR  10-17-2011  DR SUPPLE   SUBTOTAL COLECTOMY  2007   URETEROSCOPY WITH HOLMIUM LASER LITHOTRIPSY Left 07/14/2014   Procedure: URETEROSCOPY  EXTRACTION OF STONES;  Surgeon: Marcine Matar, MD;  Location: [facility];  Service: Urology;  Laterality: Left;    OB History   No obstetric history on file.      Home Medications    Prior to Admission medications   Medication Sig Start Date End Date Taking? Authorizing Provider  predniSONE (DELTASONE) 10 MG tablet Take 6 tabs daily x 2 days, 5 tabs daily x 2 days, 4 tabs daily x 2 days, etc 09/29/22  Yes Particia Nearing, PA-C  Cholecalciferol (VITAMIN D3) 125 MCG (5000 UT)  CAPS Take 5,000 Units by mouth daily.    [provider]  cyanocobalamin (,VITAMIN B-12,) 1000 MCG/ML injection ADMINISTER 1 ML(1000 MCG) IN THE MUSCLE EVERY 21 DAYS 09/19/21   Doreatha Massed, MD  diphenoxylate-atropine (LOMOTIL) 2.5-0.025 MG tablet Take 1 tablet by mouth 4 (four) times daily as needed for diarrhea or loose stools. 11/05/21   Doreatha Massed, MD  furosemide (LASIX) 40 MG tablet Take 40 mg by mouth daily as needed for fluid.     [provider]  ibuprofen (ADVIL,MOTRIN) 800 MG tablet Take 800 mg by mouth 3 (three) times daily as needed for moderate pain. 08/01/16   [provider]  magnesium oxide (MAG-OX) 400 (240 Mg) MG tablet Take 400 mg by mouth daily.    [provider]  mirabegron ER (MYRBETRIQ) 50 MG TB24 tablet Take 1 tablet (50 mg total) by mouth daily. 06/24/22   Marcine Matar, MD  Potassium Citrate 15 MEQ (1620 MG) TBCR Take 1 tablet by mouth 3 (three) times  daily. 11/14/20   Marcine Matar, MD  triamcinolone cream (KENALOG) 0.1 % Apply 1 Application topically 2 (two) times daily. 09/21/22   Leath-Warren, Sadie Haber, NP    Family History Family History  Problem Relation Age of Onset   Cancer Son     Social History Social History   Tobacco Use   Smoking status: Never   Smokeless tobacco: Never  Vaping Use   Vaping status: Never Used  Substance Use Topics   Alcohol use: Yes    Comment: 2 glasses of wine per year - maybe   Drug use: No     Allergies   Latex, Morphine and codeine, and Tape   Review of Systems Review of Systems Per HPI  Physical Exam Triage Vital Signs ED Triage Vitals  Encounter Vitals Group     BP 09/29/22 1247 (!) 162/84     Systolic BP Percentile --      Diastolic BP Percentile --      Pulse Rate 09/29/22 1247 68     Resp 09/29/22 1247 20     Temp 09/29/22 1247 98.1 F (36.7 C)     Temp Source 09/29/22 1247 Oral     SpO2 09/29/22 1247 95 %     Weight --      Height --      Head Circumference --      Peak Flow --      Pain Score 09/29/22 1248 0     Pain Loc --      Pain Education --      Exclude from Growth Chart --    No data found.  Updated Vital Signs BP (!) 162/84   Pulse 68   Temp 98.1 F (36.7 C) (Oral)   Resp 20   SpO2 95%   Visual Acuity Right Eye Distance:   Left Eye Distance:   Bilateral Distance:    Right Eye Near:   Left Eye Near:    Bilateral Near:     Physical Exam Vitals and nursing note reviewed.  Constitutional:      Appearance: Normal appearance. She is not ill-appearing.  HENT:     Head: Atraumatic.  Eyes:     Extraocular Movements: Extraocular movements intact.     Conjunctiva/sclera: Conjunctivae normal.  Cardiovascular:     Rate and Rhythm: Normal rate and regular rhythm.     Heart sounds: Normal heart sounds.  Pulmonary:     Effort: Pulmonary effort is normal.  Breath sounds: Normal breath sounds.  Musculoskeletal:        General: Normal  range of motion.     Cervical back: Normal range of motion and neck supple.  Skin:    General: Skin is warm.     Findings: Rash present.     Comments: Erythematous sporadic areas of linear rash across extremities, torso  Neurological:     Mental Status: She is alert and oriented to person, place, and time.  Psychiatric:        Mood and Affect: Mood normal.        Thought Content: Thought content normal.        Judgment: Judgment normal.      UC Treatments / Results  Labs (all labs ordered are listed, but only abnormal results are displayed) Labs Reviewed - No data to display  EKG   Radiology No results found.  Procedures Procedures (including critical care time)  Medications Ordered in UC Medications - No data to display  Initial Impression / Assessment and Plan / UC Course  I have reviewed the triage vital signs and the nursing notes.  Pertinent labs & imaging results that were available during my care of the patient were reviewed by me and considered in my medical decision making (see chart for details).     Suspect rebound poison ivy dermatitis.  Will treat with with extended prednisone taper, over-the-counter remedies and supportive home care.  Return for worsening symptoms.  Final Clinical Impressions(s) / UC Diagnoses   Final diagnoses:  Dermatitis due to plants, including poison ivy, sumac, and oak   Discharge Instructions   None    ED Prescriptions     Medication Sig Dispense Auth. Provider   predniSONE (DELTASONE) 10 MG tablet Take 6 tabs daily x 2 days, 5 tabs daily x 2 days, 4 tabs daily x 2 days, etc 42 tablet Particia Nearing, [place]      PDMP not reviewed this encounter.   Particia Nearing, [place] 09/29/22 1352

## 2022-09-29 NOTE — ED Triage Notes (Signed)
Reoccurring poison oak rash on forearms, fingers, and back of legs x 3 days. Missed PCP appointment .

## 2022-10-21 ENCOUNTER — Other Ambulatory Visit (HOSPITAL_COMMUNITY): Payer: Self-pay | Admitting: Hematology

## 2022-10-21 DIAGNOSIS — Z1231 Encounter for screening mammogram for malignant neoplasm of breast: Secondary | ICD-10-CM

## 2022-10-28 ENCOUNTER — Ambulatory Visit (HOSPITAL_COMMUNITY)
Admission: RE | Admit: 2022-10-28 | Discharge: 2022-10-28 | Disposition: A | Discharge status: Home / Self Care | Payer: PPO | Source: Ambulatory Visit | Attending: Urology | Admitting: Urology

## 2022-10-28 ENCOUNTER — Encounter (HOSPITAL_COMMUNITY): Payer: Self-pay

## 2022-10-28 ENCOUNTER — Ambulatory Visit (HOSPITAL_COMMUNITY)
Admission: RE | Admit: 2022-10-28 | Discharge: 2022-10-28 | Disposition: A | Discharge status: Home / Self Care | Payer: PPO | Source: Ambulatory Visit | Attending: Hematology | Admitting: Hematology

## 2022-10-28 DIAGNOSIS — Z1231 Encounter for screening mammogram for malignant neoplasm of breast: Secondary | ICD-10-CM | POA: Insufficient documentation

## 2022-10-28 DIAGNOSIS — N200 Calculus of kidney: Secondary | ICD-10-CM | POA: Diagnosis present

## 2022-10-28 DIAGNOSIS — Z85528 Personal history of other malignant neoplasm of kidney: Secondary | ICD-10-CM

## 2022-10-28 DIAGNOSIS — N390 Urinary tract infection, site not specified: Secondary | ICD-10-CM | POA: Diagnosis present

## 2022-10-28 NOTE — Progress Notes (Signed)
History of Present Illness: Nicole [name] has a history of renal cell carcinoma of the left kidney as well as recurrent left urolithiasis.  Renal cell carcinoma: She is status post left partial nephrectomy 1.31.2007 for renal cell carcinoma. Final pathology revealed stage T1a clear cell carcinoma, Fuhrman nuclear grade 3/4. Tumor size was 1.9 cm.   CT A/P 1/18 revealed no evidence of recurrence.   1.6.2020: CT scan revealed enhancing 19 mm left upper pole renal mass consistent with recurrent renal cell carcinoma. There is stable retroperitoneal/peri aortic adenopathy. This was treated w/ cryoablation on 3.4.2020.   6.28.2022: She is followed by Dr. Ellin Saba.  She saw him in January of this year and got a clean bill of health.  She does have Lynch syndrome.    Left urolithiasis:  She has had multiple procedures performed for recurring stones on the left.  Initially she underwent left percutaneous nephrolithotomy in March 2016.  She has had ureteroscopic procedures performed in April 2016, September 2022 and April 2024.  It is felt that the recurrent stone disease on the left has been related to her prior oncologic interventions on that side.  8.13.2024: Here today for routine check.  She has had no real stone related issues since her visit here 3 months ago.  She is on potassium citrate 3 times a day.    Past Medical History:  Diagnosis Date   Arthritis    shorulder   B12 deficiency    Chronic diarrhea SECONDARY HX COLON CANCER   History of colon cancer [facility] SYNDROME ---  COLORECTAL CANCER S/P COLECTOMY X3  LAST ONE 2007   NO RECURRENCE   History of colon cancer 05/04/2013   History of kidney infection 08/2013   History of kidney stones    History of MRSA infection 08/2013   left leg   Hx of osteopenia    Lynch syndrome 10/17/2010   Hereditary colon cancer   NHL (non-Hodgkin's lymphoma) (HCC) 10/17/2010   MONITORED BY DR Mariel Sleet   Personal history of renal cancer S/P LEFT PARTIAL  NEPHRECTOMY 2007--  NO RECURRENCE   DUE TO KIDNEY CANCER   Right shoulder pain S/P ROTATOR CUFF REPAIR   Skin cancer     Past Surgical History:  Procedure Laterality Date   ABDOMINAL HYSTERECTOMY     APPENDECTOMY  1993   W/ LAVH   COLONOSCOPY W/ POLYPECTOMY     CYSTOSCOPY W/ RETROGRADES  07/14/2014   Procedure: CYSTOSCOPY WITH RETROGRADE PYELOGRAM;  Surgeon: Marcine Matar, MD;  Location: [facility];  Service: Urology;;   CYSTOSCOPY W/ URETERAL STENT REMOVAL Left 07/14/2014   Procedure: CYSTOSCOPY WITH STENT REMOVAL;  Surgeon: Marcine Matar, MD;  Location: [facility];  Service: Urology;  Laterality: Left;   CYSTOSCOPY WITH RETROGRADE PYELOGRAM, URETEROSCOPY AND STENT PLACEMENT Left 11/30/2020   Procedure: CYSTOSCOPY WITH RETROGRADE PYELOGRAM, URETEROSCOPY/STONE EXTRACTION AND STENT PLACEMENT;  Surgeon: Marcine Matar, MD;  Location[facility];  Service: Urology;  Laterality: Left;   CYSTOSCOPY WITH RETROGRADE PYELOGRAM, URETEROSCOPY AND STENT PLACEMENT Left 06/24/2022   Procedure: CYSTOSCOPY WITH RETROGRADE PYELOGRAM, URETEROSCOPY AND STENT PLACEMENT;  Surgeon: Marcine Matar, MD;  Location: [facility];  Service: Urology;  Laterality: Left;   CYSTOSCOPY WITH STENT PLACEMENT  02/07/2012   Procedure: CYSTOSCOPY WITH STENT PLACEMENT;  Surgeon: Marcine Matar, MD;  Location: [facility];  Service: Urology;  Laterality: Left;   CYSTOSCOPY WITH STENT PLACEMENT Left 07/14/2014   Procedure: CYSTOSCOPY WITH STENT PLACEMENT;  Surgeon:  Marcine Matar, MD;  Location: [facility];  Service: Urology;  Laterality: Left;   FLEXIBLE SIGMOIDOSCOPY  08/29/2011   Procedure: FLEXIBLE SIGMOIDOSCOPY;  Surgeon: Malissa Hippo, MD;  [facility];  Service: Endoscopy;  Laterality: N/A;  930   FLEXIBLE SIGMOIDOSCOPY N/A 09/28/2012   Procedure: FLEXIBLE SIGMOIDOSCOPY;  Surgeon: Malissa Hippo, MD;   [facility];  Service: Endoscopy;  Laterality: N/A;  730   FLEXIBLE SIGMOIDOSCOPY N/A 07/08/2014   Procedure: FLEXIBLE SIGMOIDOSCOPY;  Surgeon: Malissa Hippo, MD;  [facility];  Service: Endoscopy;  Laterality: N/A;  830 - moved to 10:20 - Ann to notify pt   FLEXIBLE SIGMOIDOSCOPY N/A 08/11/2015   Procedure: FLEXIBLE SIGMOIDOSCOPY;  Surgeon: Malissa Hippo, MD;  [facility];  Service: Endoscopy;  Laterality: N/A;  730   FLEXIBLE SIGMOIDOSCOPY N/A 08/13/2016   Procedure: FLEXIBLE SIGMOIDOSCOPY;  Surgeon: Malissa Hippo, MD;  [facility];  Service: Endoscopy;  Laterality: N/A;  730   FLEXIBLE SIGMOIDOSCOPY N/A 10/06/2019   Procedure: FLEXIBLE SIGMOIDOSCOPY;  Surgeon: Malissa Hippo, MD;  [facility];  Service: Endoscopy;  Laterality: N/A;  1245   HEMICOLECTOMY  2001   right   HEMICOLECTOMY  2002   left   HOLMIUM LASER APPLICATION Left 07/14/2014   Procedure: HOLMIUM LASER LITHOTRIPSY,;  Surgeon: Marcine Matar, MD;  Location: [facility];  Service: Urology;  Laterality: Left;   HOLMIUM LASER APPLICATION Left 11/30/2020   Procedure: HOLMIUM LASER APPLICATION;  Surgeon: Marcine Matar, MD;  Location[facility];  Service: Urology;  Laterality: Left;   HOLMIUM LASER APPLICATION Left 06/24/2022   Procedure: HOLMIUM LASER APPLICATION;  Surgeon: Marcine Matar, MD;  Location: [facility];  Service: Urology;  Laterality: Left;   IR RADIOLOGIST EVAL & MGMT  04/29/2018   IR RADIOLOGIST EVAL & MGMT  10/07/2019   IR RADIOLOGIST EVAL & MGMT  10/11/2020   IR RADIOLOGIST EVAL & MGMT  11/08/2021   kidney resection  2007   left partial   LAPAROSCOPIC ASSISTED VAGINAL HYSTERECTOMY  1993   W/ BILATERAL SALPINGO-OOPHORECTOMY   LEFT FLANK EXPLORATION W/  PARTIAL LEFT NEPHRECTOMY/ LEFT HILAR LYMPH NODE BX  04-17-2005  DR    RENAL CELL CARCINOMA   LITHOTRIPSY  11-22013   x2    NEPHROLITHOTOMY Left 06/09/2014   Procedure: NEPHROLITHOTOMY PERCUTANEOUS;  Surgeon: Marcine Matar, MD;  Location: [facility];  Service: Urology;  Laterality: Left;  with STENT   RADIOLOGY WITH ANESTHESIA Left 05/20/2018   Procedure: CT WITH ANESTHESIA LEFT RENAL CRYOABLATION AND BIOPSY;  Surgeon: Berdine Dance, MD;  Location: [facility];  Service: Anesthesiology;  Laterality: Left;   RIGHT ROTATOR  CUFF REPAIR/   BICEP REPAIR  10-17-2011  DR SUPPLE   SUBTOTAL COLECTOMY  2007   URETEROSCOPY WITH HOLMIUM LASER LITHOTRIPSY Left 07/14/2014   Procedure: URETEROSCOPY  EXTRACTION OF STONES;  Surgeon: Marcine Matar, MD;  Location: [facility];  Service: Urology;  Laterality: Left;    Home Medications:  Allergies as of 10/29/2022       Reactions   Latex Rash   Morphine And Codeine Other (See Comments)   hallucinations   Tape Rash   Certain tapes         Medication List        Accurate as of October 28, 2022 12:56 PM. If you have any questions, ask your nurse or doctor.  cyanocobalamin 1000 MCG/ML injection Commonly known as: VITAMIN B12 ADMINISTER 1 ML(1000 MCG) IN THE MUSCLE EVERY 21 DAYS   diphenoxylate-atropine 2.5-0.025 MG tablet Commonly known as: LOMOTIL Take 1 tablet by mouth 4 (four) times daily as needed for diarrhea or loose stools.   furosemide 40 MG tablet Commonly known as: LASIX Take 40 mg by mouth daily as needed for fluid.   ibuprofen 800 MG tablet Commonly known as: ADVIL Take 800 mg by mouth 3 (three) times daily as needed for moderate pain.   magnesium oxide 400 (240 Mg) MG tablet Commonly known as: MAG-OX Take 400 mg by mouth daily.   mirabegron ER 50 MG Tb24 tablet Commonly known as: MYRBETRIQ Take 1 tablet (50 mg total) by mouth daily.   Potassium Citrate 15 MEQ (1620 MG) Tbcr Take 1 tablet by mouth 3 (three) times daily.   predniSONE 10 MG tablet Commonly known as: DELTASONE Take 6 tabs daily x 2 days, 5 tabs daily x 2  days, 4 tabs daily x 2 days, etc   triamcinolone cream 0.1 % Commonly known as: KENALOG Apply 1 Application topically 2 (two) times daily.   Vitamin D3 125 MCG (5000 UT) Caps Take 5,000 Units by mouth daily.        Allergies:  Allergies  Allergen Reactions   Latex Rash   Morphine And Codeine Other (See Comments)    hallucinations   Tape Rash    Certain tapes     Family History  Problem Relation Age of Onset   Cancer Son     Social History:  reports that she has never smoked. She has never used smokeless tobacco. She reports current alcohol use. She reports that she does not use drugs.  ROS: A complete review of systems was performed.  All systems are negative except for pertinent findings as noted.  Physical Exam:  Vital signs in last 24 hours: There were no vitals taken for this visit. Constitutional:  Alert and oriented, No acute distress Cardiovascular: Regular rate  Respiratory: Normal respiratory effort Neurologic: Grossly intact, no focal deficits Psychiatric: Normal mood and affect  I have reviewed prior pt notes  I have reviewed urinalysis results--pH 6  I have independently reviewed prior imaging.  I reviewed the images from 4/24 as well as yesterday.  Similar powder like fragments in the lower pole of the left kidney.  Otherwise unchanged    Impression/Assessment:  Recurrent left urolithiasis, most recent procedure 4 months ago.  Still with some fragments in lower pole, stable  History of renal cell carcinoma of the left kidney, status post partial nephrectomy and cryoablation, no evidence of recurrence at last imaging  Plan:  I will have her come back in 6 months following KUB, or if she has had a recent CT we will look at that.

## 2022-10-29 ENCOUNTER — Ambulatory Visit: Payer: PPO | Admitting: Urology

## 2022-10-29 ENCOUNTER — Encounter: Payer: Self-pay | Admitting: Urology

## 2022-10-29 VITALS — BP 160/75 | HR 76

## 2022-10-29 DIAGNOSIS — N390 Urinary tract infection, site not specified: Secondary | ICD-10-CM

## 2022-10-29 DIAGNOSIS — Z85528 Personal history of other malignant neoplasm of kidney: Secondary | ICD-10-CM | POA: Diagnosis not present

## 2022-10-29 DIAGNOSIS — N200 Calculus of kidney: Secondary | ICD-10-CM

## 2022-10-30 ENCOUNTER — Other Ambulatory Visit: Payer: Self-pay | Admitting: Hematology

## 2022-10-30 ENCOUNTER — Inpatient Hospital Stay: Payer: PPO | Attending: Hematology

## 2022-10-30 ENCOUNTER — Ambulatory Visit (HOSPITAL_COMMUNITY)
Admission: RE | Admit: 2022-10-30 | Discharge: 2022-10-30 | Disposition: A | Discharge status: Home / Self Care | Payer: PPO | Source: Ambulatory Visit | Attending: Hematology | Admitting: Hematology

## 2022-10-30 DIAGNOSIS — Z85038 Personal history of other malignant neoplasm of large intestine: Secondary | ICD-10-CM | POA: Insufficient documentation

## 2022-10-30 DIAGNOSIS — C642 Malignant neoplasm of left kidney, except renal pelvis: Secondary | ICD-10-CM | POA: Insufficient documentation

## 2022-10-30 DIAGNOSIS — E538 Deficiency of other specified B group vitamins: Secondary | ICD-10-CM | POA: Insufficient documentation

## 2022-10-30 DIAGNOSIS — Z8572 Personal history of non-Hodgkin lymphomas: Secondary | ICD-10-CM | POA: Insufficient documentation

## 2022-10-30 DIAGNOSIS — K8020 Calculus of gallbladder without cholecystitis without obstruction: Secondary | ICD-10-CM | POA: Diagnosis not present

## 2022-10-30 DIAGNOSIS — N200 Calculus of kidney: Secondary | ICD-10-CM | POA: Insufficient documentation

## 2022-10-30 DIAGNOSIS — Z9049 Acquired absence of other specified parts of digestive tract: Secondary | ICD-10-CM | POA: Diagnosis not present

## 2022-10-30 DIAGNOSIS — E530 Riboflavin deficiency: Secondary | ICD-10-CM | POA: Insufficient documentation

## 2022-10-30 DIAGNOSIS — C182 Malignant neoplasm of ascending colon: Secondary | ICD-10-CM

## 2022-10-30 DIAGNOSIS — Z905 Acquired absence of kidney: Secondary | ICD-10-CM | POA: Insufficient documentation

## 2022-10-30 DIAGNOSIS — Z1509 Genetic susceptibility to other malignant neoplasm: Secondary | ICD-10-CM | POA: Insufficient documentation

## 2022-10-30 DIAGNOSIS — Z90710 Acquired absence of both cervix and uterus: Secondary | ICD-10-CM | POA: Insufficient documentation

## 2022-10-30 LAB — COMPREHENSIVE METABOLIC PANEL
ALT: 19 U/L (ref 0–44)
AST: 19 U/L (ref 15–41)
Albumin: 3.5 g/dL (ref 3.5–5.0)
Alkaline Phosphatase: 66 U/L (ref 38–126)
Anion gap: 10 (ref 5–15)
BUN: 18 mg/dL (ref 8–23)
CO2: 24 mmol/L (ref 22–32)
Calcium: 8.9 mg/dL (ref 8.9–10.3)
Chloride: 103 mmol/L (ref 98–111)
Creatinine, Ser: 0.81 mg/dL (ref 0.44–1.00)
GFR, Estimated: >60 mL/min (ref >60)
Glucose, Bld: 108 mg/dL — ABNORMAL HIGH (ref 70–99)
Potassium: 4.2 mmol/L (ref 3.5–5.1)
Sodium: 137 mmol/L (ref 135–145)
Total Bilirubin: 0.4 mg/dL (ref 0.3–1.2)
Total Protein: 6.7 g/dL (ref 6.5–8.1)

## 2022-10-30 LAB — CBC WITH DIFFERENTIAL/PLATELET
Abs Immature Granulocytes: 0.04 10*3/uL (ref 0.00–0.07)
Basophils Absolute: 0.1 10*3/uL (ref 0.0–0.1)
Basophils Relative: 1 %
Eosinophils Absolute: 0.3 10*3/uL (ref 0.0–0.5)
Eosinophils Relative: 5 %
HCT: 35.9 % — ABNORMAL LOW (ref 36.0–46.0)
Hemoglobin: 11.5 g/dL — ABNORMAL LOW (ref 12.0–15.0)
Immature Granulocytes: 1 %
Lymphocytes Relative: 20 %
Lymphs Abs: 1.3 10*3/uL (ref 0.7–4.0)
MCH: 30.5 pg (ref 26.0–34.0)
MCHC: 32 g/dL (ref 30.0–36.0)
MCV: 95.2 fL (ref 80.0–100.0)
Monocytes Absolute: 0.7 10*3/uL (ref 0.1–1.0)
Monocytes Relative: 10 %
Neutro Abs: 4.1 10*3/uL (ref 1.7–7.7)
Neutrophils Relative %: 63 %
Platelets: 187 10*3/uL (ref 150–400)
RBC: 3.77 MIL/uL — ABNORMAL LOW (ref 3.87–5.11)
RDW: 13.2 % (ref 11.5–15.5)
WBC: 6.6 10*3/uL (ref 4.0–10.5)
nRBC: 0 % (ref 0.0–0.2)

## 2022-10-30 LAB — VITAMIN D 25 HYDROXY (VIT D DEFICIENCY, FRACTURES): Vit D, 25-Hydroxy: 47.02 ng/mL (ref 30–100)

## 2022-10-30 LAB — VITAMIN B12: Vitamin B-12: 362 pg/mL (ref 180–914)

## 2022-10-30 LAB — LACTATE DEHYDROGENASE: LDH: 152 U/L (ref 98–192)

## 2022-10-30 MED ORDER — IOHEXOL 300 MG/ML  SOLN
100.0000 mL | Freq: Once | INTRAMUSCULAR | Status: AC | PRN
  Administered 2022-10-30: 100 mL via INTRAVENOUS

## 2022-10-31 ENCOUNTER — Encounter (HOSPITAL_COMMUNITY): Payer: Self-pay | Admitting: Hematology

## 2022-10-31 LAB — CEA: CEA: 3.3 ng/mL (ref 0.0–4.7)

## 2022-11-05 NOTE — Progress Notes (Signed)
[facility] [address] [place] [address]    Clinic Day:  11/06/2022  Referring physician: Gareth Morgan, MD  Patient Care Team: Pcp, No as PCP - General   ASSESSMENT & PLAN:   Assessment: 1.  Left renal cell carcinoma: -Left partial nephrectomy on 04/17/2005, stage T1 a clear-cell renal cell carcinoma, Fuhrman nuclear grade 3/4, tumor size 1.9 cm. -MRI of the abdomen on 04/21/2018 showed left upper pole renal lesion suspicious for recurrent renal cell cancer near the partial nephrectomy site.  Stable retrocrural and retroperitoneal lymphadenopathy. -Left renal mass biopsy and cryoablation on 05/20/2018, biopsy consistent with clear-cell renal cell carcinoma. -CT abdomen with and without contrast on 09/22/2019 shows left partial nephrectomy with stable appearance of the cryoablation defect in the upper pole of the left kidney with no new or progressive findings.  Bilateral renal stones stable.  Stable retroperitoneal adenopathy.  Hepatomegaly with steatosis.   2.  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 -TAH and BSO in 1993. -Flex sigmoidoscopy on 08/13/2016 with normal ileum, end-to-end ileocolonic anastomosis, normal rectosigmoid colon, normal rectum. - Last flexible sigmoidoscopy on 10/06/2019, normal.   3.  Colon cancer: -Subtotal colectomy with ileorectal anastomosis.  No change in bowel habits.    Plan: 1.  Left renal cell carcinoma: - She does not have any clinical signs or symptoms of recurrence. - CTAP on 10/30/2022: No evidence of recurrence or metastatic disease.  Stable retroperitoneal lymph nodes.  Other benign findings discussed. - Recommend follow-up in 1 year with repeat scan and labs.   2.  Lynch syndrome: - Will refer to GI for sigmoidoscopy.   3.  Colon cancer: - CEA is 3.3.  CT did not show any metastatic disease.   4.  Follicular lymphoma: - Retroperitoneal lymphadenopathy is stable.  No B symptoms.   5.  B12 deficiency: -  Continue B12 injections every 3 months.  B12 is 362.    Orders Placed This Encounter  Procedures   CT ABDOMEN PELVIS W CONTRAST    Standing Status:   Future    Standing Expiration Date:   11/06/2023    Order Specific Question:   If indicated for the ordered procedure, I authorize the administration of contrast media per Radiology protocol    Answer:   Yes    Order Specific Question:   Does the patient have a contrast media/X-ray dye allergy?    Answer:   No    Order Specific Question:   Preferred imaging location?    Answer:   [facility]    Order Specific Question:   If indicated for the ordered procedure, I authorize the administration of oral contrast media per Radiology protocol    Answer:   Yes   CBC with Differential    Standing Status:   Future    Standing Expiration Date:   11/06/2023   Comprehensive metabolic panel    Standing Status:   Future    Standing Expiration Date:   11/06/2023   Vitamin B12    Standing Status:   Future    Standing Expiration Date:   11/06/2023   CEA    Standing Status:   Future    Standing Expiration Date:   11/06/2023   Lactate dehydrogenase    Standing Status:   Future    Standing Expiration Date:   11/06/2023      I,Katie Daubenspeck,acting as a scribe for Doreatha Massed, MD.,have documented all relevant documentation on the behalf of Nicole [name]  Ellin Saba, MD,as directed by  Doreatha Massed, MD while in the presence of Doreatha Massed, MD.   I, Doreatha Massed MD, have reviewed the above documentation for accuracy and completeness, and I agree with the above.   Doreatha Massed, MD   8/21/20246:03 PM  CHIEF COMPLAINT:   Diagnosis: left renal cell carcinoma    Cancer Staging  No matching staging information was found for the patient.    Prior Therapy: 1. Left partial nephrectomy, 04/17/2005  2. Cryoablation of recurrence, 05/20/2018  Current Therapy:  surveillance    HISTORY OF PRESENT ILLNESS:    Oncology History   No history exists.     INTERVAL HISTORY:   Nicole [name] is a 72 y.o. female presenting to clinic today for follow up of left renal cell carcinoma. She was last seen by me on 11/05/21.  She recently underwent restaging CT A/P on 10/30/22 showing: no findings specific for recurrent or metastatic disease; stable retroperitoneal nodes.  Today, she states that she is doing well overall. Her appetite level is at 100%. Her energy level is at 75%.  PAST MEDICAL HISTORY:   Past Medical History: Past Medical History:  Diagnosis Date   Arthritis    shorulder   B12 deficiency    Chronic diarrhea SECONDARY HX COLON CANCER   History of colon cancer [facility] SYNDROME ---  COLORECTAL CANCER S/P COLECTOMY X3  LAST ONE 2007   NO RECURRENCE   History of colon cancer 05/04/2013   History of kidney infection 08/2013   History of kidney stones    History of MRSA infection 08/2013   left leg   Hx of osteopenia    Lynch syndrome 10/17/2010   Hereditary colon cancer   NHL (non-Hodgkin's lymphoma) (HCC) 10/17/2010   MONITORED BY DR Mariel Sleet   Personal history of renal cancer S/P LEFT PARTIAL NEPHRECTOMY 2007--  NO RECURRENCE   DUE TO KIDNEY CANCER   Right shoulder pain S/P ROTATOR CUFF REPAIR   Skin cancer     Surgical History: Past Surgical History:  Procedure Laterality Date   ABDOMINAL HYSTERECTOMY     APPENDECTOMY  1993   W/ LAVH   COLONOSCOPY W/ POLYPECTOMY     CYSTOSCOPY W/ RETROGRADES  07/14/2014   Procedure: CYSTOSCOPY WITH RETROGRADE PYELOGRAM;  Surgeon: Marcine Matar, MD;  Location: [facility];  Service: Urology;;   CYSTOSCOPY W/ URETERAL STENT REMOVAL Left 07/14/2014   Procedure: CYSTOSCOPY WITH STENT REMOVAL;  Surgeon: Marcine Matar, MD;  Location: [facility];  Service: Urology;  Laterality: Left;   CYSTOSCOPY WITH RETROGRADE PYELOGRAM, URETEROSCOPY AND STENT PLACEMENT Left 11/30/2020   Procedure: CYSTOSCOPY WITH RETROGRADE  PYELOGRAM, URETEROSCOPY/STONE EXTRACTION AND STENT PLACEMENT;  Surgeon: Marcine Matar, MD;  Location: [facility];  Service: Urology;  Laterality: Left;   CYSTOSCOPY WITH RETROGRADE PYELOGRAM, URETEROSCOPY AND STENT PLACEMENT Left 06/24/2022   Procedure: CYSTOSCOPY WITH RETROGRADE PYELOGRAM, URETEROSCOPY AND STENT PLACEMENT;  Surgeon: Marcine Matar, MD;  Location: [facility];  Service: Urology;  Laterality: Left;   CYSTOSCOPY WITH STENT PLACEMENT  02/07/2012   Procedure: CYSTOSCOPY WITH STENT PLACEMENT;  Surgeon: Marcine Matar, MD;  Location: [facility];  Service: Urology;  Laterality: Left;   CYSTOSCOPY WITH STENT PLACEMENT Left 07/14/2014   Procedure: CYSTOSCOPY WITH STENT PLACEMENT;  Surgeon: Marcine Matar, MD;  Location: [facility];  Service: Urology;  Laterality: Left;   FLEXIBLE SIGMOIDOSCOPY  08/29/2011   Procedure: FLEXIBLE SIGMOIDOSCOPY;  Surgeon: Malissa Hippo, MD;  [facility];  Service: Endoscopy;  Laterality: N/A;  930   FLEXIBLE SIGMOIDOSCOPY N/A 09/28/2012   Procedure: FLEXIBLE SIGMOIDOSCOPY;  Surgeon: Malissa Hippo, MD;  [facility]: [facility];  Service: Endoscopy;  Laterality: N/A;  730   FLEXIBLE SIGMOIDOSCOPY N/A 07/08/2014   Procedure: FLEXIBLE SIGMOIDOSCOPY;  Surgeon: Malissa Hippo, MD;  [facility];  Service: Endoscopy;  Laterality: N/A;  830 - moved to 10:20 - Ann to notify pt   FLEXIBLE SIGMOIDOSCOPY N/A 08/11/2015   Procedure: FLEXIBLE SIGMOIDOSCOPY;  Surgeon: Malissa Hippo, MD;  [facility];  Service: Endoscopy;  Laterality: N/A;  730   FLEXIBLE SIGMOIDOSCOPY N/A 08/13/2016   Procedure: FLEXIBLE SIGMOIDOSCOPY;  Surgeon: Malissa Hippo, MD;  [facility];  Service: Endoscopy;  Laterality: N/A;  730   FLEXIBLE SIGMOIDOSCOPY N/A 10/06/2019   Procedure: FLEXIBLE SIGMOIDOSCOPY;  Surgeon: Malissa Hippo, MD;  [facility];  Service:  Endoscopy;  Laterality: N/A;  1245   HEMICOLECTOMY  2001   right   HEMICOLECTOMY  2002   left   HOLMIUM LASER APPLICATION Left 07/14/2014   Procedure: HOLMIUM LASER LITHOTRIPSY,;  Surgeon: Marcine Matar, MD;  Location: [facility];  Service: Urology;  Laterality: Left;   HOLMIUM LASER APPLICATION Left 11/30/2020   Procedure: HOLMIUM LASER APPLICATION;  Surgeon: Marcine Matar, MD;  Location: [facility];  Service: Urology;  Laterality: Left;   HOLMIUM LASER APPLICATION Left 06/24/2022   Procedure: HOLMIUM LASER APPLICATION;  Surgeon: Marcine Matar, MD;  Location: [facility];  Service: Urology;  Laterality: Left;   IR RADIOLOGIST EVAL & MGMT  04/29/2018   IR RADIOLOGIST EVAL & MGMT  10/07/2019   IR RADIOLOGIST EVAL & MGMT  10/11/2020   IR RADIOLOGIST EVAL & MGMT  11/08/2021   kidney resection  2007   left partial   LAPAROSCOPIC ASSISTED VAGINAL HYSTERECTOMY  1993   W/ BILATERAL SALPINGO-OOPHORECTOMY   LEFT FLANK EXPLORATION W/  PARTIAL LEFT NEPHRECTOMY/ LEFT HILAR LYMPH NODE BX  04-17-2005  DR DAHLSTEDT   RENAL CELL CARCINOMA   LITHOTRIPSY  11-22013   x2   NEPHROLITHOTOMY Left 06/09/2014   Procedure: NEPHROLITHOTOMY PERCUTANEOUS;  Surgeon: Marcine Matar, MD;  Location: [facility];  Service: Urology;  Laterality: Left;  with STENT   RADIOLOGY WITH ANESTHESIA Left 05/20/2018   Procedure: CT WITH ANESTHESIA LEFT RENAL CRYOABLATION AND BIOPSY;  Surgeon: Berdine Dance, MD;  Location: [facility];  Service: Anesthesiology;  Laterality: Left;   RIGHT ROTATOR  CUFF REPAIR/   BICEP REPAIR  10-17-2011  DR SUPPLE   SUBTOTAL COLECTOMY  2007   URETEROSCOPY WITH HOLMIUM LASER LITHOTRIPSY Left 07/14/2014   Procedure: URETEROSCOPY  EXTRACTION OF STONES;  Surgeon: Marcine Matar, MD;  Location: [facility];  Service: Urology;  Laterality: Left;    Social History: Social History   Socioeconomic History   Marital status: Divorced     Spouse name: Not on file   Number of children: Not on file   Years of education: Not on file   Highest education level: Not on file  Occupational History   Not on file  Tobacco Use   Smoking status: Never   Smokeless tobacco: Never  Vaping Use   Vaping status: Never Used  Substance and Sexual Activity   Alcohol use: Yes    Comment: 2 glasses of wine per year - maybe   Drug use: No   Sexual activity: Not Currently  Other  Topics Concern   Not on file  Social History Narrative   Not on file   Social Determinants of Health   Financial Resource Strain: Low Risk  (04/06/2020)   Overall Financial Resource Strain (CARDIA)    Difficulty of Paying Living Expenses: Not hard at all  Food Insecurity: No Food Insecurity (04/06/2020)   Hunger Vital Sign    Worried About Running Out of Food in the Last Year: Never true    Ran Out of Food in the Last Year: Never true  Transportation Needs: No Transportation Needs (04/06/2020)   PRAPARE - Administrator, Civil Service (Medical): No    Lack of Transportation (Non-Medical): No  Physical Activity: Inactive (04/06/2020)   Exercise Vital Sign    Days of Exercise per Week: 0 days    Minutes of Exercise per Session: 0 min  Stress: No Stress Concern Present (04/06/2020)   [employer] of Occupational Health - Occupational Stress Questionnaire    Feeling of Stress : Only a little  Social Connections: Moderately Isolated (04/06/2020)   Social Connection and Isolation Panel [NHANES]    Frequency of Communication with Friends and Family: More than three times a week    Frequency of Social Gatherings with Friends and Family: Twice a week    Attends Religious Services: More than 4 times per year    Active Member of [employer] or Organizations: No    Attends Banker Meetings: Never    Marital Status: Divorced  Catering manager Violence: Not At Risk (04/06/2020)   Humiliation, Afraid, Rape, and Kick questionnaire    Fear of Current  or Ex-Partner: No    Emotionally Abused: No    Physically Abused: No    Sexually Abused: No    Family History: Family History  Problem Relation Age of Onset   Cancer Son     Current Medications:  Current Outpatient Medications:    Cholecalciferol (VITAMIN D3) 125 MCG (5000 UT) CAPS, Take 5,000 Units by mouth daily., Disp: , Rfl:    cyanocobalamin (VITAMIN B12) 1000 MCG/ML injection, ADMINISTER 1 ML(1000 MCG) IN THE MUSCLE EVERY 21 DAYS, Disp: 1 mL, Rfl: 17   diphenoxylate-atropine (LOMOTIL) 2.5-0.025 MG tablet, Take 1 tablet by mouth 4 (four) times daily as needed for diarrhea or loose stools., Disp: 60 tablet, Rfl: 6   furosemide (LASIX) 40 MG tablet, Take 40 mg by mouth daily as needed for fluid. , Disp: , Rfl:    ibuprofen (ADVIL,MOTRIN) 800 MG tablet, Take 800 mg by mouth 3 (three) times daily as needed for moderate pain., Disp: , Rfl: 11   magnesium oxide (MAG-OX) 400 (240 Mg) MG tablet, Take 400 mg by mouth daily., Disp: , Rfl:    phentermine 37.5 MG capsule, Take 37.5 mg by mouth daily., Disp: , Rfl:    Potassium Citrate 15 MEQ (1620 MG) TBCR, Take 1 tablet by mouth 3 (three) times daily., Disp: 270 tablet, Rfl: 0   Allergies: Allergies  Allergen Reactions   Latex Rash   Morphine And Codeine Other (See Comments)    hallucinations   Tape Rash    Certain tapes     REVIEW OF SYSTEMS:   Review of Systems  Constitutional:  Negative for chills, fatigue and fever.  HENT:   Negative for lump/mass, mouth sores, nosebleeds, sore throat and trouble swallowing.   Eyes:  Negative for eye problems.  Respiratory:  Negative for cough and shortness of breath.   Cardiovascular:  Negative for chest pain,  leg swelling and palpitations.  Gastrointestinal:  Negative for abdominal pain, constipation, diarrhea, nausea and vomiting.  Genitourinary:  Negative for bladder incontinence, difficulty urinating, dysuria, frequency, hematuria and nocturia.   Musculoskeletal:  Negative for  arthralgias, back pain, flank pain, myalgias and neck pain.  Skin:  Negative for itching and rash.  Neurological:  Positive for dizziness. Negative for headaches and numbness.  Hematological:  Does not bruise/bleed easily.  Psychiatric/Behavioral:  Negative for depression, sleep disturbance and suicidal ideas. The patient is not nervous/anxious.   All other systems reviewed and are negative.    VITALS:   Blood pressure (!) 147/75, pulse 77, temperature 98.3 F (36.8 C), temperature source Oral, resp. rate 19, height 5' 0.5" (1.537 m), weight 193 lb 1 oz (87.6 kg), SpO2 100%.  Wt Readings from Last 3 Encounters:  11/06/22 193 lb 1 oz (87.6 kg)  06/24/22 185 lb 11.2 oz (84.2 kg)  11/05/21 180 lb 3.2 oz (81.7 kg)    Body mass index is 37.08 kg/m.  Performance status (ECOG): 1 - Symptomatic but completely ambulatory  PHYSICAL EXAM:   Physical Exam Vitals and nursing note reviewed. Exam conducted with a chaperone present.  Constitutional:      Appearance: Normal appearance.  Cardiovascular:     Rate and Rhythm: Normal rate and regular rhythm.     Pulses: Normal pulses.     Heart sounds: Normal heart sounds.  Pulmonary:     Effort: Pulmonary effort is normal.     Breath sounds: Normal breath sounds.  Abdominal:     Palpations: Abdomen is soft. There is no hepatomegaly, splenomegaly or mass.     Tenderness: There is no abdominal tenderness.  Musculoskeletal:     Right lower leg: No edema.     Left lower leg: No edema.  Lymphadenopathy:     Cervical: No cervical adenopathy.     Right cervical: No superficial, deep or posterior cervical adenopathy.    Left cervical: No superficial, deep or posterior cervical adenopathy.     Upper Body:     Right upper body: No supraclavicular or axillary adenopathy.     Left upper body: No supraclavicular or axillary adenopathy.  Neurological:     General: No focal deficit present.     Mental Status: She is alert and oriented to person,  place, and time.  Psychiatric:        Mood and Affect: Mood normal.        Behavior: Behavior normal.     LABS:      Latest Ref Rng & Units 10/30/2022    8:09 AM 06/24/2022    7:41 AM 10/29/2021    9:42 AM  CBC  WBC 4.0 - 10.5 K/uL 6.6   6.0   Hemoglobin 12.0 - 15.0 g/dL 40.9  81.1  91.4   Hematocrit 36.0 - 46.0 % 35.9  40.0  40.1   Platelets 150 - 400 K/uL 187   150       Latest Ref Rng & Units 10/30/2022    8:09 AM 06/24/2022    7:41 AM 10/29/2021    9:42 AM  CMP  Glucose 70 - 99 mg/dL 782  96  956   BUN 8 - 23 mg/dL 18  23  23    Creatinine 0.44 - 1.00 mg/dL 2.13  0.86  5.78   Sodium 135 - 145 mmol/L 137  141  142   Potassium 3.5 - 5.1 mmol/L 4.2  4.6  4.5   Chloride 98 -  111 mmol/L 103  106  109   CO2 22 - 32 mmol/L 24   27   Calcium 8.9 - 10.3 mg/dL 8.9   9.1   Total Protein 6.5 - 8.1 g/dL 6.7   6.8   Total Bilirubin 0.3 - 1.2 mg/dL 0.4   0.4   Alkaline Phos 38 - 126 U/L 66   65   AST 15 - 41 U/L 19   19   ALT 0 - 44 U/L 19   16      Lab Results  Component Value Date   CEA1 3.3 10/30/2022   CEA 2.6 08/16/2016   /  CEA  Date Value Ref Range Status  10/30/2022 3.3 0.0 - 4.7 ng/mL Final    Comment:    (NOTE)                             Nonsmokers          <3.9                             Smokers             <5.6 Roche [employer]ostics Electrochemiluminescence Immunoassay (ECLIA) Values obtained with different assay methods or kits cannot be used interchangeably.  Results cannot be interpreted as absolute evidence of the presence or absence of malignant disease. Performed At: [facility] [address] [place], [place] [id number] Jolene Schimke MD WU:[id number]   08/16/2016 2.6 0.0 - 4.7 ng/mL Final    Comment:    (NOTE)       Roche ECLIA methodology       Nonsmokers  <3.9                                     Smokers     <5.6 Performed At: [facility] [address] [place], [place] [id number] Mila Homer MD VH:[id number]    No  results found for: "PSA1" No results found for: "UXL244" No results found for: "CAN125"  No results found for: "TOTALPROTELP", "ALBUMINELP", "A1GS", "A2GS", "BETS", "BETA2SER", "GAMS", "MSPIKE", "SPEI" Lab Results  Component Value Date   TIBC 402 03/16/2018   TIBC 427 09/17/2017   TIBC 435 03/31/2017   FERRITIN 56 03/16/2018   FERRITIN 67 09/17/2017   FERRITIN 82 03/31/2017   IRONPCTSAT 20 03/16/2018   IRONPCTSAT 19 09/17/2017   IRONPCTSAT 23 03/31/2017   Lab Results  Component Value Date   LDH 152 10/30/2022   LDH 130 10/29/2021   LDH 119 04/23/2021     STUDIES:   CT Abdomen Pelvis W Contrast  Result Date: 11/05/2022 CLINICAL DATA:  Follow-up ascending colon cancer EXAM: CT ABDOMEN AND PELVIS WITH CONTRAST TECHNIQUE: Multidetector CT imaging of the abdomen and pelvis was performed using the standard protocol following bolus administration of intravenous contrast. RADIATION DOSE REDUCTION: This exam was performed according to the departmental dose-optimization program which includes automated exposure control, adjustment of the mA and/or kV according to patient size and/or use of iterative reconstruction technique. CONTRAST:  OMNIPAQUE IOHEXOL 300 MG/ML  SOLN COMPARISON:  10/29/2021 FINDINGS: Lower chest: Scarring in the medial right lower lobe. Hepatobiliary: Liver is within normal limits. Layering gallstones (series 2/image 28), without associated inflammatory changes. No intrahepatic extrahepatic dilatation. Pancreas: Within normal limits. Spleen: Within normal limits. Adrenals/Urinary Tract:  Adrenal glands are within normal limits. Right kidney is within normal limits. Left renal cortical scarring with postprocedural changes. Nonobstructing left renal calculi with a dominant 10 mm lower pole calculus (series 2/image 9). Additional parenchymal calcifications. No hydronephrosis. Bladder is underdistended but unremarkable. Stomach/Bowel: Stomach is within normal limits. Small  duodenal diverticulum. No evidence of bowel obstruction. Status post right hemicolectomy with appendectomy. No colonic wall thickening or inflammatory changes is seen. Vascular/Lymphatic: No evidence of abdominal aortic aneurysm. Atherosclerotic calcifications of the abdominal aorta and branch vessels, although vessels remain patent. Mildly prominent retroperitoneal nodes, including a dominant 11 mm short axis left para-aortic node (series 2/image 30), chronic/unchanged. Reproductive: Status post hysterectomy. No adnexal masses. Other: No abdominopelvic ascites. Musculoskeletal: Degenerative changes of the lumbar spine. IMPRESSION: Status post right hemicolectomy. No findings specific for recurrent or metastatic disease. Stable retroperitoneal nodes, chronic. This appearance is not considered suspicious for metastatic disease, although indolent lymphoproliferative disorder remains possible. Postprocedural changes involving the left kidney. Nonobstructing left renal calculi measuring up to 10 mm. No hydronephrosis. Cholelithiasis, without associated inflammatory changes. Electronically Signed   By: Charline Bills M.D.   On: 11/05/2022 00:28   DG Abd 1 View  Result Date: 11/03/2022 CLINICAL DATA:  Nephrolithiasis EXAM: ABDOMEN - 1 VIEW COMPARISON:  07/09/2022 FINDINGS: The bowel gas pattern is normal. N postop changes overlying the left mid to lower abdomen. Calcification consistent with a renal stone overlies the left kidney measuring about 9 mm, a stable finding. IMPRESSION: Left-sided nephrolithiasis. Electronically Signed   By: Layla Maw M.D.   On: 11/03/2022 10:16   MM 3D SCREENING MAMMOGRAM BILATERAL BREAST  Result Date: 10/29/2022 CLINICAL DATA:  Screening. EXAM: DIGITAL SCREENING BILATERAL MAMMOGRAM WITH TOMOSYNTHESIS AND CAD TECHNIQUE: Bilateral screening digital craniocaudal and mediolateral oblique mammograms were obtained. Bilateral screening digital breast tomosynthesis was performed.  The images were evaluated with computer-aided detection. COMPARISON:  Previous exam(s). ACR Breast Density Category b: There are scattered areas of fibroglandular density. FINDINGS: There are no findings suspicious for malignancy. IMPRESSION: No mammographic evidence of malignancy. A result letter of this screening mammogram will be mailed directly to the patient. RECOMMENDATION: Screening mammogram in one year. (Code:SM-B-01Y) BI-RADS CATEGORY  1: Negative. Electronically Signed   By: Sherian Rein M.D.   On: 10/29/2022 15:41

## 2022-11-06 ENCOUNTER — Encounter (INDEPENDENT_AMBULATORY_CARE_PROVIDER_SITE_OTHER): Payer: Self-pay | Admitting: *Deleted

## 2022-11-06 ENCOUNTER — Inpatient Hospital Stay: Payer: PPO | Admitting: Hematology

## 2022-11-06 VITALS — BP 147/75 | HR 77 | Temp 98.3°F | Resp 19 | Ht 60.5 in | Wt 193.1 lb

## 2022-11-06 DIAGNOSIS — C642 Malignant neoplasm of left kidney, except renal pelvis: Secondary | ICD-10-CM

## 2022-11-06 DIAGNOSIS — Z8572 Personal history of non-Hodgkin lymphomas: Secondary | ICD-10-CM | POA: Diagnosis not present

## 2022-11-06 DIAGNOSIS — C182 Malignant neoplasm of ascending colon: Secondary | ICD-10-CM

## 2022-11-06 DIAGNOSIS — Z905 Acquired absence of kidney: Secondary | ICD-10-CM | POA: Diagnosis not present

## 2022-11-06 DIAGNOSIS — Z1509 Genetic susceptibility to other malignant neoplasm: Secondary | ICD-10-CM | POA: Diagnosis not present

## 2022-11-06 DIAGNOSIS — Z85038 Personal history of other malignant neoplasm of large intestine: Secondary | ICD-10-CM | POA: Diagnosis not present

## 2022-11-06 DIAGNOSIS — E538 Deficiency of other specified B group vitamins: Secondary | ICD-10-CM

## 2022-11-06 DIAGNOSIS — Z90710 Acquired absence of both cervix and uterus: Secondary | ICD-10-CM | POA: Diagnosis not present

## 2022-11-06 NOTE — Patient Instructions (Addendum)
[facility] at [facility] Discharge Instructions   You were seen and examined today by Dr. Ellin Saba.  He reviewed the results of your lab work which are normal/stable.   He reviewed the results of your CT scan which was normal. There is no evidence of cancer.   We will see you back in one year. We will repeat lab work and a CT scan prior to your next visit.   Return as scheduled.    Thank you for choosing [facility] at [facility] to provide your oncology and hematology care.  To afford each patient quality time with our provider, please arrive at least 15 minutes before your scheduled appointment time.   If you have a lab appointment with the Cancer Center please come in thru the Main Entrance and check in at the main information desk.  You need to re-schedule your appointment should you arrive 10 or more minutes late.  We strive to give you quality time with our providers, and arriving late affects you and other patients whose appointments are after yours.  Also, if you no show three or more times for appointments you may be dismissed from the clinic at the providers discretion.     Again, thank you for choosing [facility].  Our hope is that these requests will decrease the amount of time that you wait before being seen by our physicians.       _____________________________________________________________  Should you have questions after your visit to [facility], please contact our office at [phone] and follow the prompts.  Our office hours are 8:00 a.m. and 4:30 p.m. Monday - Friday.  Please note that voicemails left after 4:00 p.m. may not be returned until the following business day.  We are closed weekends and major holidays.  You do have access to a nurse 24-7, just call the main number to the clinic [phone] and do not press any options, hold on the line and a nurse will answer the phone.    For  prescription refill requests, have your pharmacy contact our office and allow 72 hours.    Due to Covid, you will need to wear a mask upon entering the hospital. If you do not have a mask, a mask will be given to you at the Main Entrance upon arrival. For doctor visits, patients may have 1 support person age 8 or older with them. For treatment visits, patients can not have anyone with them due to social distancing guidelines and our immunocompromised population.

## 2023-01-07 ENCOUNTER — Encounter (HOSPITAL_COMMUNITY): Payer: Self-pay | Admitting: Hematology

## 2023-01-08 ENCOUNTER — Other Ambulatory Visit: Payer: Self-pay | Admitting: Interventional Radiology

## 2023-01-08 DIAGNOSIS — C182 Malignant neoplasm of ascending colon: Secondary | ICD-10-CM

## 2023-01-08 DIAGNOSIS — C642 Malignant neoplasm of left kidney, except renal pelvis: Secondary | ICD-10-CM

## 2023-01-30 ENCOUNTER — Encounter: Payer: Self-pay | Admitting: Family Medicine

## 2023-01-30 ENCOUNTER — Ambulatory Visit (INDEPENDENT_AMBULATORY_CARE_PROVIDER_SITE_OTHER): Payer: PPO | Admitting: Family Medicine

## 2023-01-30 VITALS — BP 144/70 | HR 75 | Temp 97.7°F | Ht 60.0 in | Wt 199.0 lb

## 2023-01-30 DIAGNOSIS — Z0001 Encounter for general adult medical examination with abnormal findings: Secondary | ICD-10-CM

## 2023-01-30 DIAGNOSIS — E66812 Obesity, class 2: Secondary | ICD-10-CM | POA: Diagnosis not present

## 2023-01-30 DIAGNOSIS — Z6838 Body mass index (BMI) 38.0-38.9, adult: Secondary | ICD-10-CM

## 2023-01-30 DIAGNOSIS — R030 Elevated blood-pressure reading, without diagnosis of hypertension: Secondary | ICD-10-CM | POA: Diagnosis not present

## 2023-01-30 DIAGNOSIS — Z780 Asymptomatic menopausal state: Secondary | ICD-10-CM

## 2023-01-30 DIAGNOSIS — E538 Deficiency of other specified B group vitamins: Secondary | ICD-10-CM | POA: Diagnosis not present

## 2023-01-30 DIAGNOSIS — Z13820 Encounter for screening for osteoporosis: Secondary | ICD-10-CM

## 2023-01-30 DIAGNOSIS — Z0000 Encounter for general adult medical examination without abnormal findings: Secondary | ICD-10-CM | POA: Insufficient documentation

## 2023-01-30 DIAGNOSIS — Z1159 Encounter for screening for other viral diseases: Secondary | ICD-10-CM

## 2023-01-30 NOTE — Progress Notes (Signed)
New Patient Office Visit  Subjective    Patient ID: Nicole [name], female    DOB: [birth year]  Age: 72 y.o. MRN: [id number]  CC: No chief complaint on file.   HPI Nicole [name] presents to establish care. Oriented to practice routines and expectations. Has been seeing PCP up until July. PMH includes colon CA, arthritis, kidney CA and stones, low back pain. She does see Urology and Oncology.  Breast CA screening: Mammogram status: Completed 10/28/22. Repeat every year Colon CA screening: sigmoidoscopy 3 years ago with abnormalities. DEXA: DEXA scan, DEXA scan ordered to follow up existing osteopenia. Tobacco: non-smoker Vaccines:  TdaP today    Outpatient Encounter Medications as of 01/30/2023  Medication Sig   Cholecalciferol (VITAMIN D3) 125 MCG (5000 UT) CAPS Take 5,000 Units by mouth daily.   cyanocobalamin (VITAMIN B12) 1000 MCG/ML injection ADMINISTER 1 ML(1000 MCG) IN THE MUSCLE EVERY 21 DAYS   diphenoxylate-atropine (LOMOTIL) 2.5-0.025 MG tablet Take 1 tablet by mouth 4 (four) times daily as needed for diarrhea or loose stools.   furosemide (LASIX) 40 MG tablet Take 40 mg by mouth daily as needed for fluid.    ibuprofen (ADVIL,MOTRIN) 800 MG tablet Take 800 mg by mouth 3 (three) times daily as needed for moderate pain.   magnesium oxide (MAG-OX) 400 (240 Mg) MG tablet Take 400 mg by mouth daily.   NON FORMULARY Biotin, per pt don't recall dose.   phentermine 37.5 MG capsule Take 37.5 mg by mouth daily.   Potassium Citrate 15 MEQ (1620 MG) TBCR Take 1 tablet by mouth 3 (three) times daily.   No facility-administered encounter medications on file as of 01/30/2023.    Past Medical History:  Diagnosis Date   Arthritis    shorulder   B12 deficiency    Chronic diarrhea SECONDARY HX COLON CANCER   History of colon cancer [facility] SYNDROME ---  COLORECTAL CANCER S/P COLECTOMY X3  LAST ONE 2007   NO RECURRENCE   History of colon cancer 05/04/2013   History of kidney  infection 08/2013   History of kidney stones    History of MRSA infection 08/2013   left leg   Hx of osteopenia    Lynch syndrome 10/17/2010   Hereditary colon cancer   NHL (non-Hodgkin's lymphoma) (HCC) 10/17/2010   MONITORED BY DR Mariel Sleet   Personal history of renal cancer S/P LEFT PARTIAL NEPHRECTOMY 2007--  NO RECURRENCE   DUE TO KIDNEY CANCER   Right shoulder pain S/P ROTATOR CUFF REPAIR   Skin cancer     Past Surgical History:  Procedure Laterality Date   ABDOMINAL HYSTERECTOMY     APPENDECTOMY  1993   W/ LAVH   COLONOSCOPY W/ POLYPECTOMY     CYSTOSCOPY W/ RETROGRADES  07/14/2014   Procedure: CYSTOSCOPY WITH RETROGRADE PYELOGRAM;  Surgeon: Marcine Matar, MD;  Location: [facility];  Service: Urology;;   CYSTOSCOPY W/ URETERAL STENT REMOVAL Left 07/14/2014   Procedure: CYSTOSCOPY WITH STENT REMOVAL;  Surgeon: Marcine Matar, MD;  Location[facility];  Service: Urology;  Laterality: Left;   CYSTOSCOPY WITH RETROGRADE PYELOGRAM, URETEROSCOPY AND STENT PLACEMENT Left 11/30/2020   Procedure: CYSTOSCOPY WITH RETROGRADE PYELOGRAM, URETEROSCOPY/STONE EXTRACTION AND STENT PLACEMENT;  Surgeon: Marcine Matar, MD;  Location: [facility];  Service: Urology;  Laterality: Left;   CYSTOSCOPY WITH RETROGRADE PYELOGRAM, URETEROSCOPY AND STENT PLACEMENT Left 06/24/2022   Procedure: CYSTOSCOPY WITH RETROGRADE PYELOGRAM, URETEROSCOPY AND STENT PLACEMENT;  Surgeon: Marcine Matar, MD;  Location:  [facility];  Service: Urology;  Laterality: Left;   CYSTOSCOPY WITH STENT PLACEMENT  02/07/2012   Procedure: CYSTOSCOPY WITH STENT PLACEMENT;  Surgeon: Marcine Matar, MD;  Location: [facility];  Service: Urology;  Laterality: Left;   CYSTOSCOPY WITH STENT PLACEMENT Left 07/14/2014   Procedure: CYSTOSCOPY WITH STENT PLACEMENT;  Surgeon: Marcine Matar, MD;  Location: [facility];  Service: Urology;   Laterality: Left;   FLEXIBLE SIGMOIDOSCOPY  08/29/2011   Procedure: FLEXIBLE SIGMOIDOSCOPY;  Surgeon: Malissa Hippo, MD;  [facility];  Service: Endoscopy;  Laterality: N/A;  930   FLEXIBLE SIGMOIDOSCOPY N/A 09/28/2012   Procedure: FLEXIBLE SIGMOIDOSCOPY;  Surgeon: Malissa Hippo, MD;  [facility];  Service: Endoscopy;  Laterality: N/A;  730   FLEXIBLE SIGMOIDOSCOPY N/A 07/08/2014   Procedure: FLEXIBLE SIGMOIDOSCOPY;  Surgeon: Malissa Hippo, MD;  [facility];  Service: Endoscopy;  Laterality: N/A;  830 - moved to 10:20 - Ann to notify pt   FLEXIBLE SIGMOIDOSCOPY N/A 08/11/2015   Procedure: FLEXIBLE SIGMOIDOSCOPY;  Surgeon: Malissa Hippo, MD;  [facility];  Service: Endoscopy;  Laterality: N/A;  730   FLEXIBLE SIGMOIDOSCOPY N/A 08/13/2016   Procedure: FLEXIBLE SIGMOIDOSCOPY;  Surgeon: Malissa Hippo, MD;  [facility];  Service: Endoscopy;  Laterality: N/A;  730   FLEXIBLE SIGMOIDOSCOPY N/A 10/06/2019   Procedure: FLEXIBLE SIGMOIDOSCOPY;  Surgeon: Malissa Hippo, MD;  [facility];  Service: Endoscopy;  Laterality: N/A;  1245   HEMICOLECTOMY  2001   right   HEMICOLECTOMY  2002   left   HOLMIUM LASER APPLICATION Left 07/14/2014   Procedure: HOLMIUM LASER LITHOTRIPSY,;  Surgeon: Marcine Matar, MD;  Location: [facility];  Service: Urology;  Laterality: Left;   HOLMIUM LASER APPLICATION Left 11/30/2020   Procedure: HOLMIUM LASER APPLICATION;  Surgeon: Marcine Matar, MD;  Location: [facility];  Service: Urology;  Laterality: Left;   HOLMIUM LASER APPLICATION Left 06/24/2022   Procedure: HOLMIUM LASER APPLICATION;  Surgeon: Marcine Matar, MD;  Location: [facility];  Service: Urology;  Laterality: Left;   IR RADIOLOGIST EVAL & MGMT  04/29/2018   IR RADIOLOGIST EVAL & MGMT  10/07/2019   IR RADIOLOGIST EVAL & MGMT  10/11/2020   IR RADIOLOGIST EVAL & MGMT  11/08/2021    kidney resection  2007   left partial   LAPAROSCOPIC ASSISTED VAGINAL HYSTERECTOMY  1993   W/ BILATERAL SALPINGO-OOPHORECTOMY   LEFT FLANK EXPLORATION W/  PARTIAL LEFT NEPHRECTOMY/ LEFT HILAR LYMPH NODE BX  04-17-2005  DR DAHLSTEDT   RENAL CELL CARCINOMA   LITHOTRIPSY  11-22013   x2   NEPHROLITHOTOMY Left 06/09/2014   Procedure: NEPHROLITHOTOMY PERCUTANEOUS;  Surgeon: Marcine Matar, MD;  Location: [facility];  Service: Urology;  Laterality: Left;  with STENT   RADIOLOGY WITH ANESTHESIA Left 05/20/2018   Procedure: CT WITH ANESTHESIA LEFT RENAL CRYOABLATION AND BIOPSY;  Surgeon: Berdine Dance, MD;  Location: [facility];  Service: Anesthesiology;  Laterality: Left;   RIGHT ROTATOR  CUFF REPAIR/   BICEP REPAIR  10-17-2011  DR SUPPLE   SUBTOTAL COLECTOMY  2007   URETEROSCOPY WITH HOLMIUM LASER LITHOTRIPSY Left 07/14/2014   Procedure: URETEROSCOPY  EXTRACTION OF STONES;  Surgeon: Marcine Matar, MD;  Location: [facility];  Service: Urology;  Laterality: Left;    Family History  Problem Relation Age of Onset   Cancer Son     Social  History   Socioeconomic History   Marital status: Divorced    Spouse name: Not on file   Number of children: Not on file   Years of education: Not on file   Highest education level: Some college, no degree  Occupational History   Not on file  Tobacco Use   Smoking status: Never   Smokeless tobacco: Never  Vaping Use   Vaping status: Never Used  Substance and Sexual Activity   Alcohol use: Yes    Comment: 2 glasses of wine per year - maybe   Drug use: No   Sexual activity: Not Currently  Other Topics Concern   Not on file  Social History Narrative   Not on file   Social Determinants of Health   Financial Resource Strain: Low Risk  (01/26/2023)   Overall Financial Resource Strain (CARDIA)    Difficulty of Paying Living Expenses: Not very hard  Food Insecurity: No Food Insecurity (01/26/2023)   Hunger Vital Sign    Worried About  Running Out of Food in the Last Year: Never true    Ran Out of Food in the Last Year: Never true  Transportation Needs: No Transportation Needs (01/26/2023)   PRAPARE - Administrator, Civil Service (Medical): No    Lack of Transportation (Non-Medical): No  Physical Activity: Insufficiently Active (01/26/2023)   Exercise Vital Sign    Days of Exercise per Week: 2 days    Minutes of Exercise per Session: 20 min  Stress: No Stress Concern Present (01/26/2023)   [employer] of Occupational Health - Occupational Stress Questionnaire    Feeling of Stress : Not at all  Social Connections: Moderately Integrated (01/26/2023)   Social Connection and Isolation Panel [NHANES]    Frequency of Communication with Friends and Family: More than three times a week    Frequency of Social Gatherings with Friends and Family: More than three times a week    Attends Religious Services: More than 4 times per year    Active Member of [employer] or Organizations: Yes    Attends Banker Meetings: 1 to 4 times per year    Marital Status: Divorced  Intimate Partner Violence: Not At Risk (04/06/2020)   Humiliation, Afraid, Rape, and Kick questionnaire    Fear of Current or Ex-Partner: No    Emotionally Abused: No    Physically Abused: No    Sexually Abused: No    Review of Systems  Constitutional: Negative.   HENT: Negative.    Eyes: Negative.   Respiratory: Negative.    Cardiovascular: Negative.   Gastrointestinal: Negative.   Genitourinary: Negative.   Musculoskeletal: Negative.   Skin: Negative.   Neurological: Negative.   Endo/Heme/Allergies: Negative.   Psychiatric/Behavioral: Negative.    All other systems reviewed and are negative.       Objective    BP (!) 144/70   Pulse 75   Temp 97.7 F (36.5 C) (Oral)   Ht 5' (1.524 m)   Wt 199 lb (90.3 kg)   SpO2 93%   BMI 38.86 kg/m   Physical Exam Vitals and nursing note reviewed.  Constitutional:       Appearance: Normal appearance. She is obese.  HENT:     Head: Normocephalic and atraumatic.     Right Ear: Tympanic membrane, ear canal and external ear normal.     Left Ear: Tympanic membrane, ear canal and external ear normal.     Nose: Nose normal.  Mouth/Throat:     Mouth: Mucous membranes are moist.     Pharynx: Oropharynx is clear.  Eyes:     Extraocular Movements: Extraocular movements intact.     Conjunctiva/sclera:     Right eye: Hemorrhage present.     Pupils: Pupils are equal, round, and reactive to light.  Cardiovascular:     Rate and Rhythm: Normal rate and regular rhythm.     Pulses: Normal pulses.     Heart sounds: Normal heart sounds.  Pulmonary:     Effort: Pulmonary effort is normal.     Breath sounds: Normal breath sounds.  Abdominal:     General: Bowel sounds are normal.     Palpations: Abdomen is soft.  Musculoskeletal:        General: Normal range of motion.     Cervical back: Normal range of motion and neck supple.     Right lower leg: Edema present.     Left lower leg: Edema present.  Skin:    General: Skin is warm and dry.     Capillary Refill: Capillary refill takes less than 2 seconds.  Neurological:     General: No focal deficit present.     Mental Status: She is alert and oriented to person, place, and time. Mental status is at baseline.  Psychiatric:        Mood and Affect: Mood normal.        Behavior: Behavior normal.        Thought Content: Thought content normal.        Judgment: Judgment normal.         Assessment & Plan:   Problem List Items Addressed This Visit     Obesity    Counseled on importance of weight management for overall health. Encouraged low calorie, heart healthy diet and moderate intensity exercise 150 minutes weekly. This is 3-5 times weekly for 30-50 minutes each session. Goal should be pace of 3 miles/hours, or walking 1.5 miles in 30 minutes and include strength training. Has tried Phentermine in the past.       Relevant Orders   CBC with Differential/Platelet   COMPLETE METABOLIC PANEL WITH GFR   Lipid panel   Hemoglobin A1c   B12 deficiency   Relevant Orders   Vitamin B12   Physical exam, annual - Primary    Today your medical history was reviewed and routine physical exam with labs was performed. Recommend 150 minutes of moderate intensity exercise weekly and consuming a well-balanced diet. Advised to stop smoking if a smoker, avoid smoking if a non-smoker, limit alcohol consumption to 1 drink per day for women and 2 drinks per day for men, and avoid illicit drug use. Counseled on the importance of sunscreen use. Counseled in mental health awareness and when to seek medical care. Vaccine maintenance discussed. Appropriate health maintenance items reviewed. Return to office in 1 year for annual physical exam.       Relevant Orders   CBC with Differential/Platelet   COMPLETE METABOLIC PANEL WITH GFR   Lipid panel   Hemoglobin A1c   VITAMIN D 25 Hydroxy (Vit-D Deficiency, Fractures)   Vitamin B12   Hepatitis C antibody   Elevated blood pressure reading in office without diagnosis of hypertension    144/70 today. Encouraged to monitor at home and bring values with her to next OV. Recommend heart healthy diet such as Mediterranean diet with whole grains, fruits, vegetable, fish, lean meats, nuts, and olive oil. Limit salt. Encouraged  moderate walking, 3-5 times/week for 30-50 minutes each session. Aim for at least 150 minutes.week. Goal should be pace of 3 miles/hours, or walking 1.5 miles in 30 minutes. Avoid tobacco products. Avoid excess alcohol. Seek medical care for chest pain, palpitations, shortness of breath with exertion, dizziness/lightheadedness, vision changes, recurrent headaches, or swelling of extremities.       Other Visit Diagnoses     Need for hepatitis C screening test       Relevant Orders   Hepatitis C antibody   Encounter for osteoporosis screening in asymptomatic  postmenopausal patient       Relevant Orders   DG Bone Density   VITAMIN D 25 Hydroxy (Vit-D Deficiency, Fractures)       Return in about 1 week (around 02/06/2023) for follow-up, hypertension, chronic follow-up with labs 1 week prior.   Park Meo, FNP

## 2023-01-30 NOTE — Assessment & Plan Note (Signed)
Today your medical history was reviewed and routine physical exam with labs was performed. Recommend 150 minutes of moderate intensity exercise weekly and consuming a well-balanced diet. Advised to stop smoking if a smoker, avoid smoking if a non-smoker, limit alcohol consumption to 1 drink per day for women and 2 drinks per day for men, and avoid illicit drug use. Counseled on the importance of sunscreen use. Counseled in mental health awareness and when to seek medical care. Vaccine maintenance discussed. Appropriate health maintenance items reviewed. Return to office in 1 year for annual physical exam.

## 2023-01-30 NOTE — Assessment & Plan Note (Signed)
Counseled on importance of weight management for overall health. Encouraged low calorie, heart healthy diet and moderate intensity exercise 150 minutes weekly. This is 3-5 times weekly for 30-50 minutes each session. Goal should be pace of 3 miles/hours, or walking 1.5 miles in 30 minutes and include strength training. Has tried Phentermine in the past.

## 2023-01-30 NOTE — Patient Instructions (Signed)
It was great to meet you today and I'm excited to have you join the [facility] practice. I hope you had a positive experience today! If you feel so inclined, please feel free to recommend our practice to friends and family. Mila Merry, FNP-C

## 2023-01-30 NOTE — Assessment & Plan Note (Signed)
144/70 today. Encouraged to monitor at home and bring values with her to next OV. Recommend heart healthy diet such as Mediterranean diet with whole grains, fruits, vegetable, fish, lean meats, nuts, and olive oil. Limit salt. Encouraged moderate walking, 3-5 times/week for 30-50 minutes each session. Aim for at least 150 minutes.week. Goal should be pace of 3 miles/hours, or walking 1.5 miles in 30 minutes. Avoid tobacco products. Avoid excess alcohol. Seek medical care for chest pain, palpitations, shortness of breath with exertion, dizziness/lightheadedness, vision changes, recurrent headaches, or swelling of extremities.

## 2023-02-11 ENCOUNTER — Other Ambulatory Visit: Payer: PPO

## 2023-02-12 LAB — COMPLETE METABOLIC PANEL WITH GFR
AG Ratio: 1.6 (calc) (ref 1.0–2.5)
ALT: 11 U/L (ref 6–29)
AST: 12 U/L (ref 10–35)
Albumin: 3.6 g/dL (ref 3.6–5.1)
Alkaline phosphatase (APISO): 73 U/L (ref 37–153)
BUN: 23 mg/dL (ref 7–25)
CO2: 27 mmol/L (ref 20–32)
Calcium: 8.6 mg/dL (ref 8.6–10.4)
Chloride: 106 mmol/L (ref 98–110)
Creat: 0.88 mg/dL (ref 0.60–1.00)
Globulin: 2.2 g/dL (ref 1.9–3.7)
Glucose, Bld: 105 mg/dL — ABNORMAL HIGH (ref 65–99)
Potassium: 4.9 mmol/L (ref 3.5–5.3)
Sodium: 141 mmol/L (ref 135–146)
Total Bilirubin: 0.3 mg/dL (ref 0.2–1.2)
Total Protein: 5.8 g/dL — ABNORMAL LOW (ref 6.1–8.1)
eGFR: 69 mL/min/{1.73_m2} (ref >=60)

## 2023-02-12 LAB — LIPID PANEL
Cholesterol: 215 mg/dL — ABNORMAL HIGH (ref <200)
HDL: 62 mg/dL (ref >=50)
LDL Cholesterol (Calc): 112 mg/dL — ABNORMAL HIGH
Non-HDL Cholesterol (Calc): 153 mg/dL — ABNORMAL HIGH (ref <130)
Total CHOL/HDL Ratio: 3.5 (calc) (ref <5.0)
Triglycerides: 285 mg/dL — ABNORMAL HIGH (ref <150)

## 2023-02-12 LAB — CBC WITH DIFFERENTIAL/PLATELET
Absolute Lymphocytes: 1050 {cells}/uL (ref 850–3900)
Absolute Monocytes: 768 {cells}/uL (ref 200–950)
Basophils Absolute: 90 {cells}/uL (ref 0–200)
Basophils Relative: 1.4 %
Eosinophils Absolute: 275 {cells}/uL (ref 15–500)
Eosinophils Relative: 4.3 %
HCT: 35.4 % (ref 35.0–45.0)
Hemoglobin: 11.5 g/dL — ABNORMAL LOW (ref 11.7–15.5)
MCH: 29.6 pg (ref 27.0–33.0)
MCHC: 32.5 g/dL (ref 32.0–36.0)
MCV: 91 fL (ref 80.0–100.0)
MPV: 10.6 fL (ref 7.5–12.5)
Monocytes Relative: 12 %
Neutro Abs: 4218 {cells}/uL (ref 1500–7800)
Neutrophils Relative %: 65.9 %
Platelets: 177 10*3/uL (ref 140–400)
RBC: 3.89 10*6/uL (ref 3.80–5.10)
RDW: 13.6 % (ref 11.0–15.0)
Total Lymphocyte: 16.4 %
WBC: 6.4 10*3/uL (ref 3.8–10.8)

## 2023-02-12 LAB — HEPATITIS C ANTIBODY: Hepatitis C Ab: NONREACTIVE

## 2023-02-12 LAB — VITAMIN D 25 HYDROXY (VIT D DEFICIENCY, FRACTURES): Vit D, 25-Hydroxy: 45 ng/mL (ref 30–100)

## 2023-02-12 LAB — HEMOGLOBIN A1C
Hgb A1c MFr Bld: 5.8 %{Hb} — ABNORMAL HIGH (ref <5.7)
Mean Plasma Glucose: 120 mg/dL
eAG (mmol/L): 6.6 mmol/L

## 2023-02-12 LAB — VITAMIN B12: Vitamin B-12: 352 pg/mL (ref 200–1100)

## 2023-02-18 ENCOUNTER — Ambulatory Visit: Payer: PPO | Admitting: Family Medicine

## 2023-02-18 ENCOUNTER — Encounter: Payer: Self-pay | Admitting: Family Medicine

## 2023-02-18 ENCOUNTER — Telehealth: Payer: Self-pay

## 2023-02-18 VITALS — BP 130/78 | HR 72 | Temp 97.5°F | Ht 60.0 in | Wt 201.5 lb

## 2023-02-18 DIAGNOSIS — E782 Mixed hyperlipidemia: Secondary | ICD-10-CM | POA: Diagnosis not present

## 2023-02-18 DIAGNOSIS — R030 Elevated blood-pressure reading, without diagnosis of hypertension: Secondary | ICD-10-CM | POA: Diagnosis not present

## 2023-02-18 DIAGNOSIS — R7303 Prediabetes: Secondary | ICD-10-CM | POA: Diagnosis not present

## 2023-02-18 DIAGNOSIS — R600 Localized edema: Secondary | ICD-10-CM

## 2023-02-18 NOTE — Telephone Encounter (Signed)
Copied from CRM [id number]. Topic: Referral - Request for Referral >> Feb 18, 2023 11:49 AM Theodis Sato wrote: Reason for CRM: PT is in office at  [facility]- Front desk states they are still waiting for a referral.

## 2023-02-18 NOTE — Assessment & Plan Note (Signed)
BP improved in office today 130/78. She reports home readings are <145/90. She would like to try lifestyle modifications. Recommend heart healthy diet such as Mediterranean diet with whole grains, fruits, vegetable, fish, lean meats, nuts, and olive oil. Limit salt. Encouraged moderate walking, 3-5 times/week for 30-50 minutes each session. Aim for at least 150 minutes.week. Goal should be pace of 3 miles/hours, or walking 1.5 miles in 30 minutes. Avoid tobacco products. Avoid excess alcohol. Take medications as prescribed and bring medications and blood pressure log with cuff to each office visit. Seek medical care for chest pain, palpitations, shortness of breath with exertion, dizziness/lightheadedness, vision changes, recurrent headaches, or swelling of extremities. Continue to monitor at home and return to office if readings sustain >150/90. Follow up in 6 weeks.

## 2023-02-18 NOTE — Assessment & Plan Note (Signed)
She would like to start with lifestyle modifications and declines cholesterol lowering medication at this time.  I recommend consuming a heart healthy diet such as Mediterranean diet or DASH diet with whole grains, fruits, vegetable, fish, lean meats, nuts, and olive oil. Limit sweets and processed foods. I also encourage moderate intensity exercise 150 minutes weekly. This is 3-5 times weekly for 30-50 minutes each session. Goal should be pace of 3 miles/hours, or walking 1.5 miles in 30 minutes. The 10-year ASCVD risk score (Arnett DK, et al., 2019) is: 13.5%

## 2023-02-18 NOTE — Assessment & Plan Note (Signed)
Counseled on importance of weight management for overall health. Encouraged low calorie, heart healthy diet and moderate intensity exercise 150 minutes weekly. This is 3-5 times weekly for 30-50 minutes each session. Goal should be pace of 3 miles/hours, or walking 1.5 miles in 30 minutes and include strength training.

## 2023-02-18 NOTE — Progress Notes (Signed)
Subjective:  HPI: Nicole [name] is a 72 y.o. female presenting on 02/18/2023 for Follow-up (F/u on HTN and blood work.)   HPI Patient is in today for follow up for elevated blood pressure and lab results. Other concerns include swelling of her lower extremities that prohibits her exercise. Swelling is worse in her RLE, she did previously have skin cancer removal in July in that leg. She denies recent travel or surgery, history of DVT, SOB, cough. She has noticed swelling of both ankles for many months and she takes lasix for this weekly PRN, has not taken recently. The swelling is worse in her right leg over past few weeks and she does endorse redness.  The 10-year ASCVD risk score (Arnett DK, et al., 2019) is: 13.5%   Values used to calculate the score:     Age: 13 years     Sex: Female     Is Non-Hispanic African American: No     Diabetic: No     Tobacco smoker: No     Systolic Blood Pressure: 130 mmHg     Is BP treated: No     HDL Cholesterol: 62 mg/dL     Total Cholesterol: 215 mg/dL   Review of Systems  Respiratory: Negative.    Cardiovascular: Negative.   All other systems reviewed and are negative.   Relevant past medical history reviewed and updated as indicated.   Past Medical History:  Diagnosis Date   Arthritis    shorulder   B12 deficiency    Chronic diarrhea SECONDARY HX COLON CANCER   History of colon cancer [facility] SYNDROME ---  COLORECTAL CANCER S/P COLECTOMY X3  LAST ONE 2007   NO RECURRENCE   History of colon cancer 05/04/2013   History of kidney infection 08/2013   History of kidney stones    History of MRSA infection 08/2013   left leg   Hx of osteopenia    Lynch syndrome 10/17/2010   Hereditary colon cancer   NHL (non-Hodgkin's lymphoma) (HCC) 10/17/2010   MONITORED BY DR Mariel Sleet   Personal history of renal cancer S/P LEFT PARTIAL NEPHRECTOMY 2007--  NO RECURRENCE   DUE TO KIDNEY CANCER   Right shoulder pain S/P ROTATOR CUFF REPAIR   Skin  cancer      Past Surgical History:  Procedure Laterality Date   ABDOMINAL HYSTERECTOMY     APPENDECTOMY  1993   W/ LAVH   COLONOSCOPY W/ POLYPECTOMY     CYSTOSCOPY W/ RETROGRADES  07/14/2014   Procedure: CYSTOSCOPY WITH RETROGRADE PYELOGRAM;  Surgeon: Marcine Matar, MD;  Location: [facility];  Service: Urology;;   CYSTOSCOPY W/ URETERAL STENT REMOVAL Left 07/14/2014   Procedure: CYSTOSCOPY WITH STENT REMOVAL;  Surgeon: Marcine Matar, MD;  Location: [facility];  Service: Urology;  Laterality: Left;   CYSTOSCOPY WITH RETROGRADE PYELOGRAM, URETEROSCOPY AND STENT PLACEMENT Left 11/30/2020   Procedure: CYSTOSCOPY WITH RETROGRADE PYELOGRAM, URETEROSCOPY/STONE EXTRACTION AND STENT PLACEMENT;  Surgeon: Marcine Matar, MD;  Location: [facility];  Service: Urology;  Laterality: Left;   CYSTOSCOPY WITH RETROGRADE PYELOGRAM, URETEROSCOPY AND STENT PLACEMENT Left 06/24/2022   Procedure: CYSTOSCOPY WITH RETROGRADE PYELOGRAM, URETEROSCOPY AND STENT PLACEMENT;  Surgeon: Marcine Matar, MD;  Location: [facility];  Service: Urology;  Laterality: Left;   CYSTOSCOPY WITH STENT PLACEMENT  02/07/2012   Procedure: CYSTOSCOPY WITH STENT PLACEMENT;  Surgeon: Marcine Matar, MD;  Location: [facility];  Service: Urology;  Laterality: Left;   CYSTOSCOPY  WITH STENT PLACEMENT Left 07/14/2014   Procedure: CYSTOSCOPY WITH STENT PLACEMENT;  Surgeon: Marcine Matar, MD;  Location: [facility];  Service: Urology;  Laterality: Left;   FLEXIBLE SIGMOIDOSCOPY  08/29/2011   Procedure: FLEXIBLE SIGMOIDOSCOPY;  Surgeon: Malissa Hippo, MD;  [facility];  Service: Endoscopy;  Laterality: N/A;  930   FLEXIBLE SIGMOIDOSCOPY N/A 09/28/2012   Procedure: FLEXIBLE SIGMOIDOSCOPY;  Surgeon: Malissa Hippo, MD;  [facility];  Service: Endoscopy;  Laterality: N/A;  730   FLEXIBLE SIGMOIDOSCOPY N/A 07/08/2014    Procedure: FLEXIBLE SIGMOIDOSCOPY;  Surgeon: Malissa Hippo, MD;  [facility];  Service: Endoscopy;  Laterality: N/A;  830 - moved to 10:20 - Ann to notify pt   FLEXIBLE SIGMOIDOSCOPY N/A 08/11/2015   Procedure: FLEXIBLE SIGMOIDOSCOPY;  Surgeon: Malissa Hippo, MD;  [facility];  Service: Endoscopy;  Laterality: N/A;  730   FLEXIBLE SIGMOIDOSCOPY N/A 08/13/2016   Procedure: FLEXIBLE SIGMOIDOSCOPY;  Surgeon: Malissa Hippo, MD;  [facility];  Service: Endoscopy;  Laterality: N/A;  730   FLEXIBLE SIGMOIDOSCOPY N/A 10/06/2019   Procedure: FLEXIBLE SIGMOIDOSCOPY;  Surgeon: Malissa Hippo, MD;  [facility];  Service: Endoscopy;  Laterality: N/A;  1245   HEMICOLECTOMY  2001   right   HEMICOLECTOMY  2002   left   HOLMIUM LASER APPLICATION Left 07/14/2014   Procedure: HOLMIUM LASER LITHOTRIPSY,;  Surgeon: Marcine Matar, MD;  Location: [facility];  Service: Urology;  Laterality: Left;   HOLMIUM LASER APPLICATION Left 11/30/2020   Procedure: HOLMIUM LASER APPLICATION;  Surgeon: Marcine Matar, MD;  Location: [facility];  Service: Urology;  Laterality: Left;   HOLMIUM LASER APPLICATION Left 06/24/2022   Procedure: HOLMIUM LASER APPLICATION;  Surgeon: Marcine Matar, MD;  Location: [facility];  Service: Urology;  Laterality: Left;   IR RADIOLOGIST EVAL & MGMT  04/29/2018   IR RADIOLOGIST EVAL & MGMT  10/07/2019   IR RADIOLOGIST EVAL & MGMT  10/11/2020   IR RADIOLOGIST EVAL & MGMT  11/08/2021   kidney resection  2007   left partial   LAPAROSCOPIC ASSISTED VAGINAL HYSTERECTOMY  1993   W/ BILATERAL SALPINGO-OOPHORECTOMY   LEFT FLANK EXPLORATION W/  PARTIAL LEFT NEPHRECTOMY/ LEFT HILAR LYMPH NODE BX  04-17-2005  DR DAHLSTEDT   RENAL CELL CARCINOMA   LITHOTRIPSY  11-22013   x2   NEPHROLITHOTOMY Left 06/09/2014   Procedure: NEPHROLITHOTOMY PERCUTANEOUS;  Surgeon: Marcine Matar, MD;  Location: [facility];   Service: Urology;  Laterality: Left;  with STENT   RADIOLOGY WITH ANESTHESIA Left 05/20/2018   Procedure: CT WITH ANESTHESIA LEFT RENAL CRYOABLATION AND BIOPSY;  Surgeon: Berdine Dance, MD;  Location: [facility];  Service: Anesthesiology;  Laterality: Left;   RIGHT ROTATOR  CUFF REPAIR/   BICEP REPAIR  10-17-2011  DR SUPPLE   SUBTOTAL COLECTOMY  2007   URETEROSCOPY WITH HOLMIUM LASER LITHOTRIPSY Left 07/14/2014   Procedure: URETEROSCOPY  EXTRACTION OF STONES;  Surgeon: Marcine Matar, MD;  Location: [facility];  Service: Urology;  Laterality: Left;    Allergies and medications reviewed and updated.   Current Outpatient Medications:    Cholecalciferol (VITAMIN D3) 125 MCG (5000 UT) CAPS, Take 5,000 Units by mouth daily., Disp: , Rfl:    cyanocobalamin (VITAMIN B12) 1000 MCG/ML injection, ADMINISTER 1 ML(1000 MCG) IN THE MUSCLE EVERY 21 DAYS, Disp: 1 mL, Rfl: 17   diphenoxylate-atropine (LOMOTIL) 2.5-0.025 MG tablet,  Take 1 tablet by mouth 4 (four) times daily as needed for diarrhea or loose stools., Disp: 60 tablet, Rfl: 6   furosemide (LASIX) 40 MG tablet, Take 40 mg by mouth daily as needed for fluid. , Disp: , Rfl:    ibuprofen (ADVIL,MOTRIN) 800 MG tablet, Take 800 mg by mouth 3 (three) times daily as needed for moderate pain., Disp: , Rfl: 11   magnesium oxide (MAG-OX) 400 (240 Mg) MG tablet, Take 400 mg by mouth daily., Disp: , Rfl:    NON FORMULARY, Biotin, per pt don't recall dose., Disp: , Rfl:    Potassium Citrate 15 MEQ (1620 MG) TBCR, Take 1 tablet by mouth 3 (three) times daily., Disp: 270 tablet, Rfl: 0   phentermine 37.5 MG capsule, Take 37.5 mg by mouth daily. (Patient not taking: Reported on 02/18/2023), Disp: , Rfl:   Allergies  Allergen Reactions   Latex Rash   Morphine And Codeine Other (See Comments)    hallucinations   Tape Rash    Certain tapes     Objective:   BP 130/78 (BP Location: Left Arm)   Pulse 72   Temp (!) 97.5 F (36.4 C)   Ht 5'  (1.524 m)   Wt 201 lb 8 oz (91.4 kg)   SpO2 98%   BMI 39.35 kg/m      02/18/2023   10:03 AM 01/30/2023   12:10 PM 01/30/2023   12:07 PM  Vitals with BMI  Height 5\' 0"   5\' 0"   Weight 201 lbs 8 oz  199 lbs  BMI 39.35  38.86  Systolic 130 144 696  Diastolic 78 70 72  Pulse 72  75     Physical Exam Vitals and nursing note reviewed.  Constitutional:      Appearance: Normal appearance. She is normal weight.  HENT:     Head: Normocephalic and atraumatic.  Cardiovascular:     Rate and Rhythm: Normal rate and regular rhythm.     Pulses: Normal pulses.     Heart sounds: Normal heart sounds.  Pulmonary:     Effort: Pulmonary effort is normal.     Breath sounds: Normal breath sounds.  Musculoskeletal:     Right lower leg: 2+ Edema present.     Left lower leg: 1+ Edema present.  Skin:    General: Skin is warm and dry.     Findings: Erythema present.       Neurological:     General: No focal deficit present.     Mental Status: She is alert and oriented to person, place, and time. Mental status is at baseline.  Psychiatric:        Mood and Affect: Mood normal.        Behavior: Behavior normal.        Thought Content: Thought content normal.        Judgment: Judgment normal.     Assessment & Plan:  Elevated blood pressure reading in office without diagnosis of hypertension Assessment & Plan: BP improved in office today 130/78. She reports home readings are <145/90. She would like to try lifestyle modifications. Recommend heart healthy diet such as Mediterranean diet with whole grains, fruits, vegetable, fish, lean meats, nuts, and olive oil. Limit salt. Encouraged moderate walking, 3-5 times/week for 30-50 minutes each session. Aim for at least 150 minutes.week. Goal should be pace of 3 miles/hours, or walking 1.5 miles in 30 minutes. Avoid tobacco products. Avoid excess alcohol. Take medications as prescribed and bring medications  and blood pressure log with cuff to each office  visit. Seek medical care for chest pain, palpitations, shortness of breath with exertion, dizziness/lightheadedness, vision changes, recurrent headaches, or swelling of extremities. Continue to monitor at home and return to office if readings sustain >150/90. Follow up in 6 weeks.    Orders: -     EKG 12-Lead  Mixed hyperlipidemia Assessment & Plan: She would like to start with lifestyle modifications and declines cholesterol lowering medication at this time.  I recommend consuming a heart healthy diet such as Mediterranean diet or DASH diet with whole grains, fruits, vegetable, fish, lean meats, nuts, and olive oil. Limit sweets and processed foods. I also encourage moderate intensity exercise 150 minutes weekly. This is 3-5 times weekly for 30-50 minutes each session. Goal should be pace of 3 miles/hours, or walking 1.5 miles in 30 minutes. The 10-year ASCVD risk score (Arnett DK, et al., 2019) is: 13.5%    Prediabetes Assessment & Plan: Recommend heart healthy diet such as Mediterranean diet with whole grains, fruits, vegetable, fish, lean meats, nuts, and olive oil. Limit salt. Encouraged moderate walking, 3-5 times/week for 30-50 minutes each session. Aim for at least 150 minutes.week. Goal should be pace of 3 miles/hours, or walking 1.5 miles in 30 minutes. Seek medical care for urinary frequency, extreme thirst, vision changes, lightheadedness, dizziness.     Morbid obesity (HCC) Assessment & Plan: Counseled on importance of weight management for overall health. Encouraged low calorie, heart healthy diet and moderate intensity exercise 150 minutes weekly. This is 3-5 times weekly for 30-50 minutes each session. Goal should be pace of 3 miles/hours, or walking 1.5 miles in 30 minutes and include strength training.   Lower extremity edema Assessment & Plan: This is chronic for Ms Lausier however worsening in her RLE. This lower calf is also red and warm on exam. She denies calf pain  and Homan's is negative. Will obtain US for DVT rule out and also BNP today in office. CMP normal.  Orders: -     Brain natriuretic peptide     Follow up plan: Return in about 6 weeks (around 04/01/2023) for follow-up, chronic follow-up with labs 1 week prior.  Park Meo, FNP

## 2023-02-18 NOTE — Assessment & Plan Note (Signed)
This is chronic for Nicole [name] however worsening in her RLE. This lower calf is also red and warm on exam. She denies calf pain and Homan's is negative. Will obtain US for DVT rule out and also BNP today in office. CMP normal.

## 2023-02-18 NOTE — Assessment & Plan Note (Signed)
Recommend heart healthy diet such as Mediterranean diet with whole grains, fruits, vegetable, fish, lean meats, nuts, and olive oil. Limit salt. Encouraged moderate walking, 3-5 times/week for 30-50 minutes each session. Aim for at least 150 minutes.week. Goal should be pace of 3 miles/hours, or walking 1.5 miles in 30 minutes. Seek medical care for urinary frequency, extreme thirst, vision changes, lightheadedness, dizziness.

## 2023-02-19 LAB — BRAIN NATRIURETIC PEPTIDE: Brain Natriuretic Peptide: 90 pg/mL (ref <100)

## 2023-03-26 DIAGNOSIS — L928 Other granulomatous disorders of the skin and subcutaneous tissue: Secondary | ICD-10-CM | POA: Diagnosis not present

## 2023-03-26 DIAGNOSIS — Z4801 Encounter for change or removal of surgical wound dressing: Secondary | ICD-10-CM | POA: Diagnosis not present

## 2023-03-26 DIAGNOSIS — D485 Neoplasm of uncertain behavior of skin: Secondary | ICD-10-CM | POA: Diagnosis not present

## 2023-04-03 DIAGNOSIS — I872 Venous insufficiency (chronic) (peripheral): Secondary | ICD-10-CM | POA: Diagnosis not present

## 2023-04-03 DIAGNOSIS — R600 Localized edema: Secondary | ICD-10-CM | POA: Diagnosis not present

## 2023-04-03 DIAGNOSIS — I83893 Varicose veins of bilateral lower extremities with other complications: Secondary | ICD-10-CM | POA: Diagnosis not present

## 2023-04-08 ENCOUNTER — Other Ambulatory Visit: Payer: Self-pay | Admitting: Urology

## 2023-04-08 DIAGNOSIS — N200 Calculus of kidney: Secondary | ICD-10-CM

## 2023-04-09 ENCOUNTER — Other Ambulatory Visit: Payer: Self-pay

## 2023-04-09 DIAGNOSIS — N200 Calculus of kidney: Secondary | ICD-10-CM

## 2023-04-09 MED ORDER — POTASSIUM CITRATE ER 15 MEQ (1620 MG) PO TBCR: 1.0000 | EXTENDED_RELEASE_TABLET | Freq: Three times a day (TID) | ORAL | 0 refills | Status: DC

## 2023-04-10 DIAGNOSIS — C44729 Squamous cell carcinoma of skin of left lower limb, including hip: Secondary | ICD-10-CM | POA: Diagnosis not present

## 2023-04-10 DIAGNOSIS — L570 Actinic keratosis: Secondary | ICD-10-CM | POA: Diagnosis not present

## 2023-04-10 DIAGNOSIS — X32XXXD Exposure to sunlight, subsequent encounter: Secondary | ICD-10-CM | POA: Diagnosis not present

## 2023-04-10 DIAGNOSIS — C44722 Squamous cell carcinoma of skin of right lower limb, including hip: Secondary | ICD-10-CM | POA: Diagnosis not present

## 2023-04-29 ENCOUNTER — Encounter (HOSPITAL_COMMUNITY): Payer: Self-pay | Admitting: Hematology

## 2023-05-05 ENCOUNTER — Ambulatory Visit (HOSPITAL_COMMUNITY)
Admission: RE | Admit: 2023-05-05 | Discharge: 2023-05-05 | Disposition: A | Discharge status: Home / Self Care | Payer: HMO | Source: Ambulatory Visit | Attending: Urology | Admitting: Urology

## 2023-05-05 DIAGNOSIS — N200 Calculus of kidney: Secondary | ICD-10-CM | POA: Diagnosis not present

## 2023-05-05 DIAGNOSIS — H43393 Other vitreous opacities, bilateral: Secondary | ICD-10-CM | POA: Diagnosis not present

## 2023-05-05 DIAGNOSIS — I878 Other specified disorders of veins: Secondary | ICD-10-CM | POA: Diagnosis not present

## 2023-05-05 DIAGNOSIS — K8020 Calculus of gallbladder without cholecystitis without obstruction: Secondary | ICD-10-CM | POA: Diagnosis not present

## 2023-05-05 NOTE — Progress Notes (Signed)
History of Present Illness: Nicole [name] has a history of renal cell carcinoma of the left kidney as well as recurrent left urolithiasis.  Renal cell carcinoma: She is status post left partial nephrectomy 1.31.2007 for renal cell carcinoma. Final pathology revealed stage T1a clear cell carcinoma, Fuhrman nuclear grade 3/4. Tumor size was 1.9 cm.   CT A/P 1/18 revealed no evidence of recurrence.   1.6.2020: CT scan revealed enhancing 19 mm left upper pole renal mass consistent with recurrent renal cell carcinoma. There is stable retroperitoneal/peri aortic adenopathy. This was treated w/ cryoablation on 3.4.2020.   6.28.2022: She is followed by Dr. Ellin Saba.  She saw him in January of this year and got a clean bill of health.  She does have Lynch syndrome.    Left urolithiasis:  She has had multiple procedures performed for recurring stones on the left.  Initially she underwent left percutaneous nephrolithotomy in March 2016.  She has had ureteroscopic procedures performed in April 2016, September 2022 and April 2024.  It is felt that the recurrent stone disease on the left has been related to her prior oncologic interventions on that side.  8.13.2024: Here today for routine check.  She has had no real stone related issues since her visit here 3 months ago.  She is on potassium citrate 3 times a day.  2.18.2025: Here for routine check.  Still on potassium citrate.  Also takes magnesium.  She has had no gross hematuria or dysuria.   Past Medical History:  Diagnosis Date   Arthritis    shorulder   B12 deficiency    Chronic diarrhea SECONDARY HX COLON CANCER   History of colon cancer [facility] SYNDROME ---  COLORECTAL CANCER S/P COLECTOMY X3  LAST ONE 2007   NO RECURRENCE   History of colon cancer 05/04/2013   History of kidney infection 08/2013   History of kidney stones    History of MRSA infection 08/2013   left leg   Hx of osteopenia    Lynch syndrome 10/17/2010   Hereditary colon cancer    NHL (non-Hodgkin's lymphoma) (HCC) 10/17/2010   MONITORED BY DR Mariel Sleet   Personal history of renal cancer S/P LEFT PARTIAL NEPHRECTOMY 2007--  NO RECURRENCE   DUE TO KIDNEY CANCER   Right shoulder pain S/P ROTATOR CUFF REPAIR   Skin cancer     Past Surgical History:  Procedure Laterality Date   ABDOMINAL HYSTERECTOMY     APPENDECTOMY  1993   W/ LAVH   COLONOSCOPY W/ POLYPECTOMY     CYSTOSCOPY W/ RETROGRADES  07/14/2014   Procedure: CYSTOSCOPY WITH RETROGRADE PYELOGRAM;  Surgeon: Marcine Matar, MD;  Location: [facility];  Service: Urology;;   CYSTOSCOPY W/ URETERAL STENT REMOVAL Left 07/14/2014   Procedure: CYSTOSCOPY WITH STENT REMOVAL;  Surgeon: Marcine Matar, MD;  Location: [facility];  Service: Urology;  Laterality: Left;   CYSTOSCOPY WITH RETROGRADE PYELOGRAM, URETEROSCOPY AND STENT PLACEMENT Left 11/30/2020   Procedure: CYSTOSCOPY WITH RETROGRADE PYELOGRAM, URETEROSCOPY/STONE EXTRACTION AND STENT PLACEMENT;  Surgeon: Marcine Matar, MD;  Location: [facility];  Service: Urology;  Laterality: Left;   CYSTOSCOPY WITH RETROGRADE PYELOGRAM, URETEROSCOPY AND STENT PLACEMENT Left 06/24/2022   Procedure: CYSTOSCOPY WITH RETROGRADE PYELOGRAM, URETEROSCOPY AND STENT PLACEMENT;  Surgeon: Marcine Matar, MD;  Location: [facility];  Service: Urology;  Laterality: Left;   CYSTOSCOPY WITH STENT PLACEMENT  02/07/2012   Procedure: CYSTOSCOPY WITH STENT PLACEMENT;  Surgeon: Marcine Matar, MD;  Location: [facility];  Service: Urology;  Laterality: Left;   CYSTOSCOPY WITH STENT PLACEMENT Left 07/14/2014   Procedure: CYSTOSCOPY WITH STENT PLACEMENT;  Surgeon: Marcine Matar, MD;  Location: [facility];  Service: Urology;  Laterality: Left;   FLEXIBLE SIGMOIDOSCOPY  08/29/2011   Procedure: FLEXIBLE SIGMOIDOSCOPY;  Surgeon: Malissa Hippo, MD;  [facility];  Service: Endoscopy;   Laterality: N/A;  930   FLEXIBLE SIGMOIDOSCOPY N/A 09/28/2012   Procedure: FLEXIBLE SIGMOIDOSCOPY;  Surgeon: Malissa Hippo, MD;  [facility];  Service: Endoscopy;  Laterality: N/A;  730   FLEXIBLE SIGMOIDOSCOPY N/A 07/08/2014   Procedure: FLEXIBLE SIGMOIDOSCOPY;  Surgeon: Malissa Hippo, MD;  [facility];  Service: Endoscopy;  Laterality: N/A;  830 - moved to 10:20 - Ann to notify pt   FLEXIBLE SIGMOIDOSCOPY N/A 08/11/2015   Procedure: FLEXIBLE SIGMOIDOSCOPY;  Surgeon: Malissa Hippo, MD;  [facility];  Service: Endoscopy;  Laterality: N/A;  730   FLEXIBLE SIGMOIDOSCOPY N/A 08/13/2016   Procedure: FLEXIBLE SIGMOIDOSCOPY;  Surgeon: Malissa Hippo, MD;  [facility];  Service: Endoscopy;  Laterality: N/A;  730   FLEXIBLE SIGMOIDOSCOPY N/A 10/06/2019   Procedure: FLEXIBLE SIGMOIDOSCOPY;  Surgeon: Malissa Hippo, MD;  [facility];  Service: Endoscopy;  Laterality: N/A;  1245   HEMICOLECTOMY  2001   right   HEMICOLECTOMY  2002   left   HOLMIUM LASER APPLICATION Left 07/14/2014   Procedure: HOLMIUM LASER LITHOTRIPSY,;  Surgeon: Marcine Matar, MD;  Location: [facility];  Service: Urology;  Laterality: Left;   HOLMIUM LASER APPLICATION Left 11/30/2020   Procedure: HOLMIUM LASER APPLICATION;  Surgeon: Marcine Matar, MD;  Location: [facility];  Service: Urology;  Laterality: Left;   HOLMIUM LASER APPLICATION Left 06/24/2022   Procedure: HOLMIUM LASER APPLICATION;  Surgeon: Marcine Matar, MD;  Location: [facility];  Service: Urology;  Laterality: Left;   IR RADIOLOGIST EVAL & MGMT  04/29/2018   IR RADIOLOGIST EVAL & MGMT  10/07/2019   IR RADIOLOGIST EVAL & MGMT  10/11/2020   IR RADIOLOGIST EVAL & MGMT  11/08/2021   kidney resection  2007   left partial   LAPAROSCOPIC ASSISTED VAGINAL HYSTERECTOMY  1993   W/ BILATERAL SALPINGO-OOPHORECTOMY   LEFT FLANK EXPLORATION W/  PARTIAL LEFT  NEPHRECTOMY/ LEFT HILAR LYMPH NODE BX  04-17-2005  DR Yailin Biederman   RENAL CELL CARCINOMA   LITHOTRIPSY  11-22013   x2   NEPHROLITHOTOMY Left 06/09/2014   Procedure: NEPHROLITHOTOMY PERCUTANEOUS;  Surgeon: Marcine Matar, MD;  Location: [facility];  Service: Urology;  Laterality: Left;  with STENT   RADIOLOGY WITH ANESTHESIA Left 05/20/2018   Procedure: CT WITH ANESTHESIA LEFT RENAL CRYOABLATION AND BIOPSY;  Surgeon: Berdine Dance, MD;  Location: [facility];  Service: Anesthesiology;  Laterality: Left;   RIGHT ROTATOR  CUFF REPAIR/   BICEP REPAIR  10-17-2011  DR SUPPLE   SUBTOTAL COLECTOMY  2007   URETEROSCOPY WITH HOLMIUM LASER LITHOTRIPSY Left 07/14/2014   Procedure: URETEROSCOPY  EXTRACTION OF STONES;  Surgeon: Marcine Matar, MD;  Location: [facility];  Service: Urology;  Laterality: Left;    Home Medications:  Allergies as of 05/06/2023       Reactions   Latex Rash   Morphine And Codeine Other (See Comments)   hallucinations   Tape Rash   Certain tapes         Medication List  Accurate as of May 05, 2023 12:40 PM. If you have any questions, ask your nurse or doctor.          cyanocobalamin 1000 MCG/ML injection Commonly known as: VITAMIN B12 ADMINISTER 1 ML(1000 MCG) IN THE MUSCLE EVERY 21 DAYS   diphenoxylate-atropine 2.5-0.025 MG tablet Commonly known as: LOMOTIL Take 1 tablet by mouth 4 (four) times daily as needed for diarrhea or loose stools.   furosemide 40 MG tablet Commonly known as: LASIX Take 40 mg by mouth daily as needed for fluid.   ibuprofen 800 MG tablet Commonly known as: ADVIL Take 800 mg by mouth 3 (three) times daily as needed for moderate pain.   magnesium oxide 400 (240 Mg) MG tablet Commonly known as: MAG-OX Take 400 mg by mouth daily.   NON FORMULARY Biotin, per pt don't recall dose.   phentermine 37.5 MG capsule Take 37.5 mg by mouth daily.   Potassium Citrate 15 MEQ (1620 MG) Tbcr Take 1 tablet by mouth 3  (three) times daily.   Vitamin D3 125 MCG (5000 UT) Caps Take 5,000 Units by mouth daily.        Allergies:  Allergies  Allergen Reactions   Latex Rash   Morphine And Codeine Other (See Comments)    hallucinations   Tape Rash    Certain tapes     Family History  Problem Relation Age of Onset   Cancer Son     Social History:  reports that she has never smoked. She has never used smokeless tobacco. She reports current alcohol use. She reports that she does not use drugs.  ROS: A complete review of systems was performed.  All systems are negative except for pertinent findings as noted.  Physical Exam:  Vital signs in last 24 hours: There were no vitals taken for this visit. Constitutional:  Alert and oriented, No acute distress Cardiovascular: Regular rate  Respiratory: Normal respiratory effort Neurologic: Grossly intact, no focal deficits Psychiatric: Normal mood and affect  I have reviewed prior pt notes  I have reviewed urinalysis results--clear  I have independently reviewed prior imaging.  I reviewed the images from 4/24 as well as the film from yesterday.  7 mm calcification overlying the lower pole of the left kidney.  Some small calcifications beside that.  No new calcifications.  Prior surgical notes reviewed    Impression/Assessment:  Recurrent left urolithiasis, most recent procedure just under a year ago.  Stable fragments in left lower pole  History of renal cell carcinoma of the left kidney, status post partial nephrectomy and cryoablation, no evidence of recurrence at last imaging  Plan:  I will have her come back in 6 months following the CT scan she will be getting for oncology

## 2023-05-06 ENCOUNTER — Ambulatory Visit (INDEPENDENT_AMBULATORY_CARE_PROVIDER_SITE_OTHER): Payer: HMO | Admitting: Urology

## 2023-05-06 VITALS — BP 188/82 | HR 89

## 2023-05-06 DIAGNOSIS — Z85528 Personal history of other malignant neoplasm of kidney: Secondary | ICD-10-CM

## 2023-05-06 DIAGNOSIS — N390 Urinary tract infection, site not specified: Secondary | ICD-10-CM

## 2023-05-06 DIAGNOSIS — N200 Calculus of kidney: Secondary | ICD-10-CM | POA: Diagnosis not present

## 2023-05-06 LAB — URINALYSIS, ROUTINE W REFLEX MICROSCOPIC
Bilirubin, UA: NEGATIVE
Glucose, UA: NEGATIVE
Ketones, UA: NEGATIVE
Leukocytes,UA: NEGATIVE
Nitrite, UA: NEGATIVE
Protein,UA: NEGATIVE
RBC, UA: NEGATIVE
Specific Gravity, UA: 1.025 (ref 1.005–1.030)
Urobilinogen, Ur: 0.2 mg/dL (ref 0.2–1.0)
pH, UA: 6 (ref 5.0–7.5)

## 2023-06-05 DIAGNOSIS — Z1283 Encounter for screening for malignant neoplasm of skin: Secondary | ICD-10-CM | POA: Diagnosis not present

## 2023-06-05 DIAGNOSIS — Z85828 Personal history of other malignant neoplasm of skin: Secondary | ICD-10-CM | POA: Diagnosis not present

## 2023-06-05 DIAGNOSIS — X32XXXD Exposure to sunlight, subsequent encounter: Secondary | ICD-10-CM | POA: Diagnosis not present

## 2023-06-05 DIAGNOSIS — L570 Actinic keratosis: Secondary | ICD-10-CM | POA: Diagnosis not present

## 2023-06-05 DIAGNOSIS — D225 Melanocytic nevi of trunk: Secondary | ICD-10-CM | POA: Diagnosis not present

## 2023-06-05 DIAGNOSIS — Z08 Encounter for follow-up examination after completed treatment for malignant neoplasm: Secondary | ICD-10-CM | POA: Diagnosis not present

## 2023-06-05 DIAGNOSIS — L820 Inflamed seborrheic keratosis: Secondary | ICD-10-CM | POA: Diagnosis not present

## 2023-07-14 ENCOUNTER — Other Ambulatory Visit: Payer: Self-pay | Admitting: Urology

## 2023-07-14 DIAGNOSIS — N200 Calculus of kidney: Secondary | ICD-10-CM

## 2023-07-17 DIAGNOSIS — X32XXXD Exposure to sunlight, subsequent encounter: Secondary | ICD-10-CM | POA: Diagnosis not present

## 2023-07-17 DIAGNOSIS — L570 Actinic keratosis: Secondary | ICD-10-CM | POA: Diagnosis not present

## 2023-07-20 ENCOUNTER — Encounter: Payer: Self-pay | Admitting: Emergency Medicine

## 2023-07-20 ENCOUNTER — Other Ambulatory Visit: Payer: Self-pay

## 2023-07-20 ENCOUNTER — Ambulatory Visit
Admission: EM | Admit: 2023-07-20 | Discharge: 2023-07-20 | Disposition: A | Discharge status: Home / Self Care | Attending: Family Medicine | Admitting: Family Medicine

## 2023-07-20 DIAGNOSIS — L03011 Cellulitis of right finger: Secondary | ICD-10-CM

## 2023-07-20 MED ORDER — CEPHALEXIN 500 MG PO CAPS: 500.0000 mg | ORAL_CAPSULE | Freq: Two times a day (BID) | ORAL | 0 refills | Status: DC

## 2023-07-20 NOTE — ED Provider Notes (Signed)
 [facility]    CSN: [id number] Arrival date & time: 07/20/23  1157      History   Chief Complaint Chief Complaint  Patient presents with   Hand Pain    HPI Nicole [name] is a 73 y.o. female.   Patient presenting today with pain, swelling and now green drainage building up below the right thumbnail into the cuticle.  States symptoms been worsening over the last 3 days.  Denies known injury, drainage, fevers, numbness, tingling, decreased range of motion.  Trying soaks and Polysporin with minimal relief.    Past Medical History:  Diagnosis Date   Arthritis    shorulder   B12 deficiency    Chronic diarrhea SECONDARY HX COLON CANCER   History of colon cancer [facility] SYNDROME ---  COLORECTAL CANCER S/P COLECTOMY X3  LAST ONE 2007   NO RECURRENCE   History of colon cancer 05/04/2013   History of kidney infection 08/2013   History of kidney stones    History of MRSA infection 08/2013   left leg   Hx of osteopenia    Lynch syndrome 10/17/2010   Hereditary colon cancer   NHL (non-Hodgkin's lymphoma) (HCC) 10/17/2010   MONITORED BY DR Nila Barth   Personal history of renal cancer S/P LEFT PARTIAL NEPHRECTOMY 2007--  NO RECURRENCE   DUE TO KIDNEY CANCER   Right shoulder pain S/P ROTATOR CUFF REPAIR   Skin cancer     Patient Active Problem List   Diagnosis Date Noted   Lower extremity edema 02/18/2023   Morbid obesity (HCC) 02/18/2023   Prediabetes 02/18/2023   Mixed hyperlipidemia 02/18/2023   Physical exam, annual 01/30/2023   Elevated blood pressure reading in office without diagnosis of hypertension 01/30/2023   Squamous acanthoma of lower extremity 08/08/2022   Pain of left heel 05/25/2019   Left renal mass 05/20/2018   Renal mass, left 05/20/2018   Hx of colon cancer, stage I 08/07/2016   Renal stones 06/09/2014   Renal calculi    History of colon cancer 05/04/2013   Pain in joint, shoulder region 10/28/2011   Muscle weakness (generalized)  10/28/2011   Rotator cuff tear, right 10/17/2011   Lynch syndrome 10/17/2010   Renal cell carcinoma (HCC) 10/17/2010   NHL (non-Hodgkin's lymphoma) (HCC) 10/17/2010   Obesity 10/17/2010   B12 deficiency 10/17/2010    Past Surgical History:  Procedure Laterality Date   ABDOMINAL HYSTERECTOMY     APPENDECTOMY  1993   W/ LAVH   COLONOSCOPY W/ POLYPECTOMY     CYSTOSCOPY W/ RETROGRADES  07/14/2014   Procedure: CYSTOSCOPY WITH RETROGRADE PYELOGRAM;  Surgeon: Trent Frizzle, MD;  Location: [facility];  Service: Urology;;   CYSTOSCOPY W/ URETERAL STENT REMOVAL Left 07/14/2014   Procedure: CYSTOSCOPY WITH STENT REMOVAL;  Surgeon: Trent Frizzle, MD;  Location[facility];  Service: Urology;  Laterality: Left;   CYSTOSCOPY WITH RETROGRADE PYELOGRAM, URETEROSCOPY AND STENT PLACEMENT Left 11/30/2020   Procedure: CYSTOSCOPY WITH RETROGRADE PYELOGRAM, URETEROSCOPY/STONE EXTRACTION AND STENT PLACEMENT;  Surgeon: Trent Frizzle, MD;  Location: [facility];  Service: Urology;  Laterality: Left;   CYSTOSCOPY WITH RETROGRADE PYELOGRAM, URETEROSCOPY AND STENT PLACEMENT Left 06/24/2022   Procedure: CYSTOSCOPY WITH RETROGRADE PYELOGRAM, URETEROSCOPY AND STENT PLACEMENT;  Surgeon: Trent Frizzle, MD;  Location[facility];  Service: Urology;  Laterality: Left;   CYSTOSCOPY WITH STENT PLACEMENT  02/07/2012   Procedure: CYSTOSCOPY WITH STENT PLACEMENT;  Surgeon: Trent Frizzle, MD;  Location: [facility];  Service: Urology;  Laterality: Left;   CYSTOSCOPY WITH STENT PLACEMENT Left 07/14/2014   Procedure: CYSTOSCOPY WITH STENT PLACEMENT;  Surgeon: Trent Frizzle, MD;  Location: [facility];  Service: Urology;  Laterality: Left;   FLEXIBLE SIGMOIDOSCOPY  08/29/2011   Procedure: FLEXIBLE SIGMOIDOSCOPY;  Surgeon: Ruby Corporal, MD;  [facility];  Service: Endoscopy;  Laterality: N/A;  930    FLEXIBLE SIGMOIDOSCOPY N/A 09/28/2012   Procedure: FLEXIBLE SIGMOIDOSCOPY;  Surgeon: Ruby Corporal, MD;  [facility];  Service: Endoscopy;  Laterality: N/A;  730   FLEXIBLE SIGMOIDOSCOPY N/A 07/08/2014   Procedure: FLEXIBLE SIGMOIDOSCOPY;  Surgeon: Ruby Corporal, MD;  [facility];  Service: Endoscopy;  Laterality: N/A;  830 - moved to 10:20 - Ann to notify pt   FLEXIBLE SIGMOIDOSCOPY N/A 08/11/2015   Procedure: FLEXIBLE SIGMOIDOSCOPY;  Surgeon: Ruby Corporal, MD;  [facility];  Service: Endoscopy;  Laterality: N/A;  730   FLEXIBLE SIGMOIDOSCOPY N/A 08/13/2016   Procedure: FLEXIBLE SIGMOIDOSCOPY;  Surgeon: Ruby Corporal, MD;  [facility];  Service: Endoscopy;  Laterality: N/A;  730   FLEXIBLE SIGMOIDOSCOPY N/A 10/06/2019   Procedure: FLEXIBLE SIGMOIDOSCOPY;  Surgeon: Ruby Corporal, MD;  [facility];  Service: Endoscopy;  Laterality: N/A;  1245   HEMICOLECTOMY  2001   right   HEMICOLECTOMY  2002   left   HOLMIUM LASER APPLICATION Left 07/14/2014   Procedure: HOLMIUM LASER LITHOTRIPSY,;  Surgeon: Trent Frizzle, MD;  Location: [facility];  Service: Urology;  Laterality: Left;   HOLMIUM LASER APPLICATION Left 11/30/2020   Procedure: HOLMIUM LASER APPLICATION;  Surgeon: Trent Frizzle, MD;  Location: [facility];  Service: Urology;  Laterality: Left;   HOLMIUM LASER APPLICATION Left 06/24/2022   Procedure: HOLMIUM LASER APPLICATION;  Surgeon: Trent Frizzle, MD;  Location: [facility];  Service: Urology;  Laterality: Left;   IR RADIOLOGIST EVAL & MGMT  04/29/2018   IR RADIOLOGIST EVAL & MGMT  10/07/2019   IR RADIOLOGIST EVAL & MGMT  10/11/2020   IR RADIOLOGIST EVAL & MGMT  11/08/2021   kidney resection  2007   left partial   LAPAROSCOPIC ASSISTED VAGINAL HYSTERECTOMY  1993   W/ BILATERAL SALPINGO-OOPHORECTOMY   LEFT FLANK EXPLORATION W/  PARTIAL LEFT NEPHRECTOMY/ LEFT HILAR LYMPH  NODE BX  04-17-2005  DR DAHLSTEDT   RENAL CELL CARCINOMA   LITHOTRIPSY  11-22013   x2   NEPHROLITHOTOMY Left 06/09/2014   Procedure: NEPHROLITHOTOMY PERCUTANEOUS;  Surgeon: Trent Frizzle, MD;  Location: [facility];  Service: Urology;  Laterality: Left;  with STENT   RADIOLOGY WITH ANESTHESIA Left 05/20/2018   Procedure: CT WITH ANESTHESIA LEFT RENAL CRYOABLATION AND BIOPSY;  Surgeon: Lucinda Saber, MD;  Location: [facility];  Service: Anesthesiology;  Laterality: Left;   RIGHT ROTATOR  CUFF REPAIR/   BICEP REPAIR  10-17-2011  DR SUPPLE   SUBTOTAL COLECTOMY  2007   URETEROSCOPY WITH HOLMIUM LASER LITHOTRIPSY Left 07/14/2014   Procedure: URETEROSCOPY  EXTRACTION OF STONES;  Surgeon: Trent Frizzle, MD;  Location: [facility];  Service: Urology;  Laterality: Left;    OB History   No obstetric history on file.      Home Medications    Prior to Admission medications   Medication Sig Start Date End Date Taking? Authorizing Provider  cephALEXin  (KEFLEX ) 500 MG capsule Take 1 capsule (500 mg total) by mouth 2 (two) times daily.  07/20/23  Yes Corbin Dess, PA-C  Cholecalciferol  (VITAMIN D3) 125 MCG (5000 UT) CAPS Take 5,000 Units by mouth daily.    [provider]  cyanocobalamin  (VITAMIN B12) 1000 MCG/ML injection ADMINISTER 1 ML(1000 MCG) IN THE MUSCLE EVERY 21 DAYS 10/31/22   Paulett Boros, MD  diphenoxylate -atropine  (LOMOTIL ) 2.5-0.025 MG tablet Take 1 tablet by mouth 4 (four) times daily as needed for diarrhea or loose stools. 11/05/21   Paulett Boros, MD  furosemide  (LASIX ) 40 MG tablet Take 40 mg by mouth daily as needed for fluid.     [provider]  ibuprofen  (ADVIL ,MOTRIN ) 800 MG tablet Take 800 mg by mouth 3 (three) times daily as needed for moderate pain. 08/01/16   [provider]  magnesium oxide (MAG-OX) 400 (240 Mg) MG tablet Take 400 mg by mouth daily.    [provider]  NON FORMULARY Biotin , per pt don't  recall dose.    [provider]  phentermine 37.5 MG capsule Take 37.5 mg by mouth daily. Patient not taking: Reported on 05/06/2023 10/05/22   [provider]  Potassium Citrate  15 MEQ (1620 MG) TBCR TAKE 1 TABLET BY MOUTH THREE TIMES A DAY 07/15/23   Trent Frizzle, MD    Family History Family History  Problem Relation Age of Onset   Cancer Son     Social History Social History   Tobacco Use   Smoking status: Never   Smokeless tobacco: Never  Vaping Use   Vaping status: Never Used  Substance Use Topics   Alcohol use: Yes    Comment: 2 glasses of wine per year - maybe   Drug use: No     Allergies   Latex, Morphine and codeine, and Tape   Review of Systems Review of Systems PER HPI  Physical Exam Triage Vital Signs ED Triage Vitals  Encounter Vitals Group     BP 07/20/23 1245 (!) 164/94     Systolic BP Percentile --      Diastolic BP Percentile --      Pulse Rate 07/20/23 1245 82     Resp 07/20/23 1245 20     Temp 07/20/23 1245 98.6 F (37 C)     Temp Source 07/20/23 1245 Oral     SpO2 07/20/23 1245 96 %     Weight --      Height --      Head Circumference --      Peak Flow --      Pain Score 07/20/23 1244 0     Pain Loc --      Pain Education --      Exclude from Growth Chart --    No data found.  Updated Vital Signs BP (!) 164/94 (BP Location: Right Arm)   Pulse 82   Temp 98.6 F (37 C) (Oral)   Resp 20   SpO2 96%   Visual Acuity Right Eye Distance:   Left Eye Distance:   Bilateral Distance:    Right Eye Near:   Left Eye Near:    Bilateral Near:     Physical Exam Vitals and nursing note reviewed.  Constitutional:      Appearance: Normal appearance. She is not ill-appearing.  HENT:     Head: Atraumatic.  Eyes:     Extraocular Movements: Extraocular movements intact.     Conjunctiva/sclera: Conjunctivae normal.  Cardiovascular:     Rate and Rhythm: Normal rate.  Pulmonary:     Effort: Pulmonary effort is  normal.  Musculoskeletal:        General: Swelling and tenderness present. No signs of injury. Normal range of motion.     Cervical back: Normal range of motion and neck supple.  Skin:    General: Skin is warm and dry.     Comments: Pocket of green purulent fluid below right thumbnail at the base extending into cuticle.  No active bleeding or drainage  Neurological:     Mental Status: She is alert and oriented to person, place, and time.     Comments: Right hand neurovascularly intact  Psychiatric:        Mood and Affect: Mood normal.        Thought Content: Thought content normal.        Judgment: Judgment normal.      UC Treatments / Results  Labs (all labs ordered are listed, but only abnormal results are displayed) Labs Reviewed - No data to display  EKG   Radiology No results found.  Procedures Procedures (including critical care time)  Medications Ordered in UC Medications - No data to display  Initial Impression / Assessment and Plan / UC Course  I have reviewed the triage vital signs and the nursing notes.  Pertinent labs & imaging results that were available during my care of the patient were reviewed by me and considered in my medical decision making (see chart for details).     Vitals and exam reassuring today, consistent with paronychia.  Treat with Keflex , warm Epsom salt soaks, and return precautions reviewed.  Final Clinical Impressions(s) / UC Diagnoses   Final diagnoses:  Paronychia of finger, right   Discharge Instructions   None    ED Prescriptions     Medication Sig Dispense Auth. Provider   cephALEXin  (KEFLEX ) 500 MG capsule Take 1 capsule (500 mg total) by mouth 2 (two) times daily. 14 capsule Corbin Dess, [place]      PDMP not reviewed this encounter.   Corbin Dess, [place] 07/20/23 1331

## 2023-07-20 NOTE — ED Triage Notes (Signed)
 Pt reports right thumb pain and green drainage noted under nail bed since Thursday. Pt denies any known injury. Pt also reports left thumb has black discoloration under nail bed and is concerned for possible fungus.

## 2023-07-23 ENCOUNTER — Encounter (HOSPITAL_COMMUNITY): Payer: Self-pay

## 2023-07-28 ENCOUNTER — Ambulatory Visit: Payer: Self-pay

## 2023-07-28 NOTE — Telephone Encounter (Signed)
 Chief Complaint: abdominal pain Symptoms: abdominal pain Frequency: since Saturday  Pertinent Negatives: Patient denies CP, SOB, bloody stools, vomiting  Disposition: [] ED /[] Urgent Care (no appt availability in office) / [] Appointment(In office/virtual)/ []  [facility]/ [] Home Care/ [] Refused Recommended Disposition /[] [place] [facility]/ []  Follow-up with PCP Additional Notes: Pt reports intermittent 6/10 abdominal pain since Saturday to the top of her stomach. Pt denies that it radiates to her chest or back. Pt states pain is worse after eating. Pt states she had a fever of 100.8F on Saturday but none since. Denies bloody stools and vomiting. Pt states her stools are always loose and do not appear any looser. Pt states she believes the pain may be attributed to gallstones as those have been visualized on prior CT scans. Pt denies urinary symptoms. Denies back pain. RN scheduled pt for 0915 in the AM. RN advised pt if her pain worsens, if she begins vomiting and can't tolerate PO intake, if she develops a fever, or if she develops bloody stools she needs go to the ED. Pt verbalized understanding.     Copied from CRM [id number]. Topic: Clinical - Red Word Triage >> Jul 28, 2023  4:27 PM Sasha H wrote: Red Word that prompted transfer to Nurse Triage: PT states she has gallstones again, says the issue started Saturday and pain level is a 6 Reason for Disposition  [1] MODERATE pain (e.g., interferes with normal activities) AND [2] pain comes and goes (cramps) AND [3] present > 24 hours  (Exception: Pain with Vomiting or Diarrhea - see that Guideline.)  Answer Assessment - Initial Assessment Questions 1. LOCATION: "Where does it hurt?"      Top of stomach  2. RADIATION: "Does the pain shoot anywhere else?" (e.g., chest, back)     Denies  3. ONSET: "When did the pain begin?" (e.g., minutes, hours or days ago)      Saturday  4. SUDDEN: "Gradual or sudden onset?"     "Doesn't start  immediately after eating, but is worse after eating" 5. PATTERN "Does the pain come and go, or is it constant?"    - If it comes and goes: "How long does it last?" "Do you have pain now?"     (Note: Comes and goes means the pain is intermittent. It goes away completely between bouts.)    - If constant: "Is it getting better, staying the same, or getting worse?"      (Note: Constant means the pain never goes away completely; most serious pain is constant and gets worse.)      "Once it starts it stays there, on Saturday when it started I got into bed, then I had a chill for 45 minutes", intermittent  6. SEVERITY: "How bad is the pain?"  (e.g., Scale 1-10; mild, moderate, or severe)    - MILD (1-3): Doesn't interfere with normal activities, abdomen soft and not tender to touch.     - MODERATE (4-7): Interferes with normal activities or awakens from sleep, abdomen tender to touch.     - SEVERE (8-10): Excruciating pain, doubled over, unable to do any normal activities.       6/10  7. RECURRENT SYMPTOM: "Have you ever had this type of stomach pain before?" If Yes, ask: "When was the last time?" and "What happened that time?"      Yes, gallstones  8. CAUSE: "What do you think is causing the stomach pain?"     Gallstones  9. RELIEVING/AGGRAVATING FACTORS: "  What makes it better or worse?" (e.g., antacids, bending or twisting motion, bowel movement)     Worse after eating 10. OTHER SYMPTOMS: "Do you have any other symptoms?" (e.g., back pain, diarrhea, fever, urination pain, vomiting)       "Gall stones for years, I wonder if they are flaring up." Denies vomiting. Denies diarrhea. Denies bloody stools. Denies urinary symptoms. 100.88F was it's highest Saturday. 99.88F today. Took Tylenol . Denies back pain. Denies SOB  Protocols used: Abdominal Pain - Female-A-AH

## 2023-07-29 ENCOUNTER — Ambulatory Visit: Admitting: Family Medicine

## 2023-07-29 ENCOUNTER — Encounter: Payer: Self-pay | Admitting: Family Medicine

## 2023-07-29 ENCOUNTER — Ambulatory Visit (INDEPENDENT_AMBULATORY_CARE_PROVIDER_SITE_OTHER): Admitting: Family Medicine

## 2023-07-29 VITALS — BP 126/80 | HR 86 | Temp 98.9°F | Ht 60.0 in | Wt 192.0 lb

## 2023-07-29 DIAGNOSIS — K8020 Calculus of gallbladder without cholecystitis without obstruction: Secondary | ICD-10-CM

## 2023-07-29 DIAGNOSIS — R1013 Epigastric pain: Secondary | ICD-10-CM

## 2023-07-29 DIAGNOSIS — L03011 Cellulitis of right finger: Secondary | ICD-10-CM

## 2023-07-29 LAB — CBC WITH DIFFERENTIAL/PLATELET
Absolute Lymphocytes: 746 {cells}/uL — ABNORMAL LOW (ref 850–3900)
Absolute Monocytes: 1025 {cells}/uL — ABNORMAL HIGH (ref 200–950)
Basophils Absolute: 57 {cells}/uL (ref 0–200)
Basophils Relative: 0.7 %
Eosinophils Absolute: 148 {cells}/uL (ref 15–500)
Eosinophils Relative: 1.8 %
HCT: 39.6 % (ref 35.0–45.0)
Hemoglobin: 12.4 g/dL (ref 11.7–15.5)
MCH: 26.7 pg — ABNORMAL LOW (ref 27.0–33.0)
MCHC: 31.3 g/dL — ABNORMAL LOW (ref 32.0–36.0)
MCV: 85.2 fL (ref 80.0–100.0)
MPV: 11.4 fL (ref 7.5–12.5)
Monocytes Relative: 12.5 %
Neutro Abs: 6224 {cells}/uL (ref 1500–7800)
Neutrophils Relative %: 75.9 %
Platelets: 236 10*3/uL (ref 140–400)
RBC: 4.65 10*6/uL (ref 3.80–5.10)
RDW: 17 % — ABNORMAL HIGH (ref 11.0–15.0)
Total Lymphocyte: 9.1 %
WBC: 8.2 10*3/uL (ref 3.8–10.8)

## 2023-07-29 LAB — COMPREHENSIVE METABOLIC PANEL WITH GFR
AG Ratio: 1.4 (calc) (ref 1.0–2.5)
ALT: 173 U/L — ABNORMAL HIGH (ref 6–29)
AST: 101 U/L — ABNORMAL HIGH (ref 10–35)
Albumin: 4 g/dL (ref 3.6–5.1)
Alkaline phosphatase (APISO): 262 U/L — ABNORMAL HIGH (ref 37–153)
BUN: 19 mg/dL (ref 7–25)
CO2: 27 mmol/L (ref 20–32)
Calcium: 9.5 mg/dL (ref 8.6–10.4)
Chloride: 104 mmol/L (ref 98–110)
Creat: 0.89 mg/dL (ref 0.60–1.00)
Globulin: 2.8 g/dL (ref 1.9–3.7)
Glucose, Bld: 125 mg/dL — ABNORMAL HIGH (ref 65–99)
Potassium: 5 mmol/L (ref 3.5–5.3)
Sodium: 141 mmol/L (ref 135–146)
Total Bilirubin: 0.9 mg/dL (ref 0.2–1.2)
Total Protein: 6.8 g/dL (ref 6.1–8.1)
eGFR: 68 mL/min/{1.73_m2} (ref >=60)

## 2023-07-29 MED ORDER — MUPIROCIN 2 % EX OINT
1.0000 | TOPICAL_OINTMENT | Freq: Two times a day (BID) | CUTANEOUS | 0 refills | Status: DC
Start: 2023-07-29 — End: 2023-10-01

## 2023-07-29 MED ORDER — CEPHALEXIN 500 MG PO CAPS: 500.0000 mg | ORAL_CAPSULE | Freq: Four times a day (QID) | ORAL | 0 refills | Status: DC

## 2023-07-29 NOTE — Progress Notes (Signed)
 Patient Office Visit  Assessment & Plan:  Epigastric pain -     CBC with Differential/Platelet -     Comprehensive metabolic panel with GFR -     US  Abdomen Complete; Future  Gallstones -     US  Abdomen Complete; Future  Paronychia of finger of right hand -     Cephalexin ; Take 1 capsule (500 mg total) by mouth 4 (four) times daily.  Dispense: 40 capsule; Refill: 0 -     Mupirocin; Apply 1 Application topically 2 (two) times daily.  Dispense: 22 g; Refill: 0   Assessment and Plan    Abdominal pain with possible gallbladder involvement Intermittent abdominal pain likely due to gallstones, possibly triggered by dietary factors. Symptoms improved with Pepto Bismol, but recurrence is a concern. - Order abdominal ultrasound to evaluate gallbladder. - Perform liver function tests and white cell count.  Gallstones Gallstones identified on previous CT scan, likely contributing to abdominal pain post-greasy meals.  Nail infection Right thumb nail infection partially improved with Keflex . Infection exacerbated by dog lick. Current symptoms include redness and swelling. Previous Keflex  dosage was suboptimal. - Prescribe Bactroban for local application. - Increase Keflex  dosage. - Advise yogurt consumption to prevent yeast infections.  History of colon cancer Colon cancer with hereditary component. No colon present. Chronic loose stools managed by diet. Weight loss from 201 lbs to 192 lbs was desired.  History of kidney cancer No current issues discussed.      RUQ quadrant ultrasound ordered to rule out cholecystitis versus other.  Follow-up on lab work.  If the thumb does not improve she will need to follow-up with Amber or see dermatology.  No follow-ups on file.   Subjective:     Patient ID: Nicole [name], female    DOB: 1950-11-30  Age: 73 y.o. MRN: [id number]  Chief Complaint  Patient presents with   Abdominal Pain    Pain in the middle abdomen. Pt describes it as a  dull ache. Started on Saturday. No N/V/D. Pt c/o of 101 fever associated with the pain. Hx of gallstones.     Abdominal Pain   Discussed the use of AI scribe software for clinical note transcription with the patient, who gave verbal consent to proceed.  History of Present Illness   Nicole [name] is a 73 year old female with gallstones who presents with abdominal pain.  She has been experiencing central abdominal pain since Saturday, which began after consuming a meal that included hot sausage. The pain initially subsided with dietary modifications but intensified on Monday, prompting her to seek medical attention. Pepto Bismol provided some relief. No nausea or vomiting, but she experienced chills on Saturday for about 45 minutes after eating.  She has a history of gallstones identified in a CT scan from August of the previous year, but she has not had an ultrasound for this issue. She also has a history of colon cancer, kidney cancer, and kidney stones. She has no colon, resulting in frequent loose stools. Her weight has decreased from 201 pounds in December to 192 pounds, attributed to dietary changes.  She denies alcohol, smoking, or drug use. She recently had a nail infection on her right thumb, treated with a 7-day course of Keflex , which improved the infection by about 50%. However, it remains red and puffy. Her dog accidentally burst the infection, providing some relief. She is cautious with her diet due to her history of loose stools and recent abdominal pain, and  she is apprehensive about eating out for her upcoming birthday.     Right thumb paronychia-patient mentioned this at the end of the visit.  Patient was seen at the urgent care in [place] and was prescribed Keflex  500 twice a day for 7 days.  Patient thinks she is better but not at 100%. Patient is not diabetic.      The 10-year ASCVD risk score (Arnett DK, et al., 2019) is: 12.7%  Past Medical History:  Diagnosis Date    Allergy ?   Anemia    Arthritis    shorulder   B12 deficiency    Chronic diarrhea SECONDARY HX COLON CANCER   History of colon cancer [facility] SYNDROME ---  COLORECTAL CANCER S/P COLECTOMY X3  LAST ONE 2007   NO RECURRENCE   History of colon cancer 05/04/2013   History of kidney infection 08/2013   History of kidney stones    History of MRSA infection 08/2013   left leg   Hx of osteopenia    Hyperlipidemia ?   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   NHL (non-Hodgkin's lymphoma) (HCC) 10/17/2010   MONITORED BY DR Nila Barth   Personal history of renal cancer S/P LEFT PARTIAL NEPHRECTOMY 2007--  NO RECURRENCE   DUE TO KIDNEY CANCER   Right shoulder pain S/P ROTATOR CUFF REPAIR   Skin cancer    Past Surgical History:  Procedure Laterality Date   ABDOMINAL HYSTERECTOMY     APPENDECTOMY  1993   W/ LAVH   COLON SURGERY  2001,2002,2007   COLONOSCOPY W/ POLYPECTOMY     CYSTOSCOPY W/ RETROGRADES  07/14/2014   Procedure: CYSTOSCOPY WITH RETROGRADE PYELOGRAM;  Surgeon: Trent Frizzle, MD;  Location: [facility];  Service: Urology;;   CYSTOSCOPY W/ URETERAL STENT REMOVAL Left 07/14/2014   Procedure: CYSTOSCOPY WITH STENT REMOVAL;  Surgeon: Trent Frizzle, MD;  Location: [facility];  Service: Urology;  Laterality: Left;   CYSTOSCOPY WITH RETROGRADE PYELOGRAM, URETEROSCOPY AND STENT PLACEMENT Left 11/30/2020   Procedure: CYSTOSCOPY WITH RETROGRADE PYELOGRAM, URETEROSCOPY/STONE EXTRACTION AND STENT PLACEMENT;  Surgeon: Trent Frizzle, MD;  Location: [facility];  Service: Urology;  Laterality: Left;   CYSTOSCOPY WITH RETROGRADE PYELOGRAM, URETEROSCOPY AND STENT PLACEMENT Left 06/24/2022   Procedure: CYSTOSCOPY WITH RETROGRADE PYELOGRAM, URETEROSCOPY AND STENT PLACEMENT;  Surgeon: Trent Frizzle, MD;  Location: [facility];  Service: Urology;  Laterality: Left;   CYSTOSCOPY  WITH STENT PLACEMENT  02/07/2012   Procedure: CYSTOSCOPY WITH STENT PLACEMENT;  Surgeon: Trent Frizzle, MD;  Location: [facility];  Service: Urology;  Laterality: Left;   CYSTOSCOPY WITH STENT PLACEMENT Left 07/14/2014   Procedure: CYSTOSCOPY WITH STENT PLACEMENT;  Surgeon: Trent Frizzle, MD;  Location: [facility];  Service: Urology;  Laterality: Left;   EYE SURGERY  ?   Removal of styes   FLEXIBLE SIGMOIDOSCOPY  08/29/2011   Procedure: FLEXIBLE SIGMOIDOSCOPY;  Surgeon: Ruby Corporal, MD;  [facility];  Service: Endoscopy;  Laterality: N/A;  930   FLEXIBLE SIGMOIDOSCOPY N/A 09/28/2012   Procedure: FLEXIBLE SIGMOIDOSCOPY;  Surgeon: Ruby Corporal, MD;  Location: [facility];  Service: Endoscopy;  Laterality: N/A;  730   FLEXIBLE SIGMOIDOSCOPY N/A 07/08/2014   Procedure: FLEXIBLE SIGMOIDOSCOPY;  Surgeon: Ruby Corporal, MD;  [facility];  Service: Endoscopy;  Laterality: N/A;  830 - moved to 10:20 - Ann to notify pt   FLEXIBLE SIGMOIDOSCOPY  N/A 08/11/2015   Procedure: FLEXIBLE SIGMOIDOSCOPY;  Surgeon: Ruby Corporal, MD;  [facility];  Service: Endoscopy;  Laterality: N/A;  730   FLEXIBLE SIGMOIDOSCOPY N/A 08/13/2016   Procedure: FLEXIBLE SIGMOIDOSCOPY;  Surgeon: Ruby Corporal, MD;  [facility];  Service: Endoscopy;  Laterality: N/A;  730   FLEXIBLE SIGMOIDOSCOPY N/A 10/06/2019   Procedure: FLEXIBLE SIGMOIDOSCOPY;  Surgeon: Ruby Corporal, MD;  [facility];  Service: Endoscopy;  Laterality: N/A;  1245   HEMICOLECTOMY  2001   right   HEMICOLECTOMY  2002   left   HOLMIUM LASER APPLICATION Left 07/14/2014   Procedure: HOLMIUM LASER LITHOTRIPSY,;  Surgeon: Trent Frizzle, MD;  Location: [facility];  Service: Urology;  Laterality: Left;   HOLMIUM LASER APPLICATION Left 11/30/2020   Procedure: HOLMIUM LASER APPLICATION;  Surgeon: Trent Frizzle, MD;  Location: [facility];  Service: Urology;  Laterality: Left;   HOLMIUM LASER APPLICATION Left 06/24/2022   Procedure: HOLMIUM LASER APPLICATION;  Surgeon: Trent Frizzle, MD;  Location: [facility];  Service: Urology;  Laterality: Left;   IR RADIOLOGIST EVAL & MGMT  04/29/2018   IR RADIOLOGIST EVAL & MGMT  10/07/2019   IR RADIOLOGIST EVAL & MGMT  10/11/2020   IR RADIOLOGIST EVAL & MGMT  11/08/2021   kidney resection  2007   left partial   LAPAROSCOPIC ASSISTED VAGINAL HYSTERECTOMY  1993   W/ BILATERAL SALPINGO-OOPHORECTOMY   LEFT FLANK EXPLORATION W/  PARTIAL LEFT NEPHRECTOMY/ LEFT HILAR LYMPH NODE BX  04-17-2005  DR DAHLSTEDT   RENAL CELL CARCINOMA   LITHOTRIPSY  11-22013   x2   NEPHROLITHOTOMY Left 06/09/2014   Procedure: NEPHROLITHOTOMY PERCUTANEOUS;  Surgeon: Trent Frizzle, MD;  Location: [facility];  Service: Urology;  Laterality: Left;  with STENT   RADIOLOGY WITH ANESTHESIA Left 05/20/2018   Procedure: CT WITH ANESTHESIA LEFT RENAL CRYOABLATION AND BIOPSY;  Surgeon: Lucinda Saber, MD;  Location: [facility];  Service: Anesthesiology;  Laterality: Left;   RIGHT ROTATOR  CUFF REPAIR/   BICEP REPAIR  10-17-2011  DR SUPPLE   SUBTOTAL COLECTOMY  2007   URETEROSCOPY WITH HOLMIUM LASER LITHOTRIPSY Left 07/14/2014   Procedure: URETEROSCOPY  EXTRACTION OF STONES;  Surgeon: Trent Frizzle, MD;  Location: [facility];  Service: Urology;  Laterality: Left;   Social History   Tobacco Use   Smoking status: Never   Smokeless tobacco: Never  Vaping Use   Vaping status: Never Used  Substance Use Topics   Alcohol use: Yes    Comment: 2 glasses of wine per year - maybe   Drug use: No   Family History  Problem Relation Age of Onset   Cancer Son    Early death Son    Cancer Mother    Cancer Maternal Grandfather    Allergies  Allergen Reactions   Latex Rash   Morphine And Codeine Other (See Comments)    hallucinations   Tape Rash    Certain tapes      Review of Systems  Gastrointestinal:  Positive for abdominal pain.      Objective:    BP 126/80   Pulse 86   Temp 98.9 F (37.2 C)   Ht 5' (1.524 m)   Wt 192 lb (87.1 kg)   SpO2 98%   BMI 37.50 kg/m  BP Readings from Last 3 Encounters:  07/29/23 126/80  07/20/23 (!) 164/94  05/06/23 (!) 188/82   Wt Readings from  Last 3 Encounters:  07/29/23 192 lb (87.1 kg)  02/18/23 201 lb 8 oz (91.4 kg)  01/30/23 199 lb (90.3 kg)    Physical Exam Vitals and nursing note reviewed.  Constitutional:      Appearance: Normal appearance.  HENT:     Head: Normocephalic.     Right Ear: Tympanic membrane, ear canal and external ear normal.     Left Ear: Tympanic membrane, ear canal and external ear normal.  Eyes:     Extraocular Movements: Extraocular movements intact.     Pupils: Pupils are equal, round, and reactive to light.  Cardiovascular:     Rate and Rhythm: Normal rate and regular rhythm.     Heart sounds: Normal heart sounds.  Pulmonary:     Effort: Pulmonary effort is normal.     Breath sounds: Normal breath sounds.  Abdominal:     General: Bowel sounds are normal.     Palpations: There is no mass.     Tenderness: There is no abdominal tenderness. There is no guarding or rebound.  Skin:    Findings: Erythema present.     Comments: Right thumb with erythema around nailbed. No purulence  Neurological:     General: No focal deficit present.     Mental Status: She is alert and oriented to person, place, and time.      No results found for any visits on 07/29/23.

## 2023-07-30 ENCOUNTER — Ambulatory Visit

## 2023-07-30 ENCOUNTER — Ambulatory Visit: Payer: Self-pay | Admitting: Family Medicine

## 2023-07-30 VITALS — Ht 60.0 in | Wt 192.0 lb

## 2023-07-30 DIAGNOSIS — Z0000 Encounter for general adult medical examination without abnormal findings: Secondary | ICD-10-CM

## 2023-07-30 NOTE — Progress Notes (Signed)
 Subjective:   Nicole [name] is a 73 y.o. who presents for a Medicare Wellness preventive visit.  As a reminder, Annual Wellness Visits don't include a physical exam, and some assessments may be limited, especially if this visit is performed virtually. We may recommend an in-person visit if needed.  Visit Complete: Virtual I connected with  SOPHIA MUHS on 07/30/23 by a audio enabled telemedicine application and verified that I am speaking with the correct person using two identifiers.  Patient Location: Home  Provider Location: Home Office  I discussed the limitations of evaluation and management by telemedicine. The patient expressed understanding and agreed to proceed.  Vital Signs: Because this visit was a virtual/telehealth visit, some criteria may be missing or patient reported. Any vitals not documented were not able to be obtained and vitals that have been documented are patient reported.  VideoDeclined- This patient declined Librarian, academic. Therefore the visit was completed with audio only.  Persons Participating in Visit: Patient.  AWV Questionnaire: No: Patient Medicare AWV questionnaire was not completed prior to this visit.  Cardiac Risk Factors include: advanced age (>57men, >80 women);dyslipidemia     Objective:     Today's Vitals   07/30/23 1532  Weight: 192 lb (87.1 kg)  Height: 5' (1.524 m)   Body mass index is 37.5 kg/m.     07/30/2023    3:37 PM 11/06/2022    8:05 AM 06/24/2022    7:04 AM 11/05/2021   11:42 AM 04/30/2021    8:10 AM 11/30/2020    5:51 AM 10/10/2020    8:14 AM  Advanced Directives  Does Patient Have a Medical Advance Directive? No No No No No No No  Would patient like information on creating a medical advance directive? Yes (MAU/Ambulatory/Procedural Areas - Information given) No - Patient declined No - Patient declined No - Patient declined No - Patient declined Yes (MAU/Ambulatory/Procedural Areas -  Information given) No - Patient declined    Current Medications (verified) Outpatient Encounter Medications as of 07/30/2023  Medication Sig   cephALEXin  (KEFLEX ) 500 MG capsule Take 1 capsule (500 mg total) by mouth 4 (four) times daily.   Cholecalciferol  (VITAMIN D3) 125 MCG (5000 UT) CAPS Take 5,000 Units by mouth daily.   cyanocobalamin  (VITAMIN B12) 1000 MCG/ML injection ADMINISTER 1 ML(1000 MCG) IN THE MUSCLE EVERY 21 DAYS   diphenoxylate -atropine  (LOMOTIL ) 2.5-0.025 MG tablet Take 1 tablet by mouth 4 (four) times daily as needed for diarrhea or loose stools.   furosemide  (LASIX ) 40 MG tablet Take 40 mg by mouth daily as needed for fluid.    ibuprofen  (ADVIL ,MOTRIN ) 800 MG tablet Take 800 mg by mouth 3 (three) times daily as needed for moderate pain.   magnesium oxide (MAG-OX) 400 (240 Mg) MG tablet Take 400 mg by mouth daily.   mupirocin ointment (BACTROBAN) 2 % Apply 1 Application topically 2 (two) times daily.   NON FORMULARY Biotin , per pt don't recall dose.   Potassium Citrate  15 MEQ (1620 MG) TBCR TAKE 1 TABLET BY MOUTH THREE TIMES A DAY   No facility-administered encounter medications on file as of 07/30/2023.    Allergies (verified) Latex, Morphine and codeine, and Tape   History: Past Medical History:  Diagnosis Date   Allergy ?   Anemia    Arthritis    shorulder   B12 deficiency    Chronic diarrhea SECONDARY HX COLON CANCER   History of colon cancer [facility] SYNDROME ---  COLORECTAL CANCER S/P  COLECTOMY X3  LAST ONE 2007   NO RECURRENCE   History of colon cancer 05/04/2013   History of kidney infection 08/2013   History of kidney stones    History of MRSA infection 08/2013   left leg   Hx of osteopenia    Hyperlipidemia ?   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   NHL (non-Hodgkin's lymphoma) (HCC) 10/17/2010   MONITORED BY DR Nila Barth   Personal history of renal cancer S/P LEFT PARTIAL NEPHRECTOMY 2007--  NO  RECURRENCE   DUE TO KIDNEY CANCER   Right shoulder pain S/P ROTATOR CUFF REPAIR   Skin cancer    Past Surgical History:  Procedure Laterality Date   ABDOMINAL HYSTERECTOMY     APPENDECTOMY  1993   W/ LAVH   COLON SURGERY  2001,2002,2007   COLONOSCOPY W/ POLYPECTOMY     CYSTOSCOPY W/ RETROGRADES  07/14/2014   Procedure: CYSTOSCOPY WITH RETROGRADE PYELOGRAM;  Surgeon: Trent Frizzle, MD;  Location: [facility];  Service: Urology;;   CYSTOSCOPY W/ URETERAL STENT REMOVAL Left 07/14/2014   Procedure: CYSTOSCOPY WITH STENT REMOVAL;  Surgeon: Trent Frizzle, MD;  Location: [facility];  Service: Urology;  Laterality: Left;   CYSTOSCOPY WITH RETROGRADE PYELOGRAM, URETEROSCOPY AND STENT PLACEMENT Left 11/30/2020   Procedure: CYSTOSCOPY WITH RETROGRADE PYELOGRAM, URETEROSCOPY/STONE EXTRACTION AND STENT PLACEMENT;  Surgeon: Trent Frizzle, MD;  Location: [facility];  Service: Urology;  Laterality: Left;   CYSTOSCOPY WITH RETROGRADE PYELOGRAM, URETEROSCOPY AND STENT PLACEMENT Left 06/24/2022   Procedure: CYSTOSCOPY WITH RETROGRADE PYELOGRAM, URETEROSCOPY AND STENT PLACEMENT;  Surgeon: Trent Frizzle, MD;  Location: [facility];  Service: Urology;  Laterality: Left;   CYSTOSCOPY WITH STENT PLACEMENT  02/07/2012   Procedure: CYSTOSCOPY WITH STENT PLACEMENT;  Surgeon: Trent Frizzle, MD;  Location: [facility];  Service: Urology;  Laterality: Left;   CYSTOSCOPY WITH STENT PLACEMENT Left 07/14/2014   Procedure: CYSTOSCOPY WITH STENT PLACEMENT;  Surgeon: Trent Frizzle, MD;  Location: [facility];  Service: Urology;  Laterality: Left;   EYE SURGERY  ?   Removal of styes   FLEXIBLE SIGMOIDOSCOPY  08/29/2011   Procedure: FLEXIBLE SIGMOIDOSCOPY;  Surgeon: Ruby Corporal, MD;  [facility];  Service: Endoscopy;  Laterality: N/A;  930   FLEXIBLE SIGMOIDOSCOPY N/A 09/28/2012   Procedure:  FLEXIBLE SIGMOIDOSCOPY;  Surgeon: Ruby Corporal, MD;  [facility];  Service: Endoscopy;  Laterality: N/A;  730   FLEXIBLE SIGMOIDOSCOPY N/A 07/08/2014   Procedure: FLEXIBLE SIGMOIDOSCOPY;  Surgeon: Ruby Corporal, MD;  [facility];  Service: Endoscopy;  Laterality: N/A;  830 - moved to 10:20 - Ann to notify pt   FLEXIBLE SIGMOIDOSCOPY N/A 08/11/2015   Procedure: FLEXIBLE SIGMOIDOSCOPY;  Surgeon: Ruby Corporal, MD;  [facility];  Service: Endoscopy;  Laterality: N/A;  730   FLEXIBLE SIGMOIDOSCOPY N/A 08/13/2016   Procedure: FLEXIBLE SIGMOIDOSCOPY;  Surgeon: Ruby Corporal, MD;  [facility];  Service: Endoscopy;  Laterality: N/A;  730   FLEXIBLE SIGMOIDOSCOPY N/A 10/06/2019   Procedure: FLEXIBLE SIGMOIDOSCOPY;  Surgeon: Ruby Corporal, MD;  [facility];  Service: Endoscopy;  Laterality: N/A;  1245   HEMICOLECTOMY  2001   right   HEMICOLECTOMY  2002   left   HOLMIUM LASER APPLICATION Left 07/14/2014   Procedure: HOLMIUM LASER LITHOTRIPSY,;  Surgeon: Trent Frizzle, MD;  Location: [facility];  Service: Urology;  Laterality: Left;   HOLMIUM LASER APPLICATION Left 11/30/2020   Procedure: HOLMIUM LASER APPLICATION;  Surgeon: Trent Frizzle, MD;  Location: [facility];  Service: Urology;  Laterality: Left;   HOLMIUM LASER APPLICATION Left 06/24/2022   Procedure: HOLMIUM LASER APPLICATION;  Surgeon: Trent Frizzle, MD;  Location: [facility];  Service: Urology;  Laterality: Left;   IR RADIOLOGIST EVAL & MGMT  04/29/2018   IR RADIOLOGIST EVAL & MGMT  10/07/2019   IR RADIOLOGIST EVAL & MGMT  10/11/2020   IR RADIOLOGIST EVAL & MGMT  11/08/2021   kidney resection  2007   left partial   LAPAROSCOPIC ASSISTED VAGINAL HYSTERECTOMY  1993   W/ BILATERAL SALPINGO-OOPHORECTOMY   LEFT FLANK EXPLORATION W/  PARTIAL LEFT NEPHRECTOMY/ LEFT HILAR LYMPH NODE BX  04-17-2005  DR DAHLSTEDT    RENAL CELL CARCINOMA   LITHOTRIPSY  11-22013   x2   NEPHROLITHOTOMY Left 06/09/2014   Procedure: NEPHROLITHOTOMY PERCUTANEOUS;  Surgeon: Trent Frizzle, MD;  Location: [facility];  Service: Urology;  Laterality: Left;  with STENT   RADIOLOGY WITH ANESTHESIA Left 05/20/2018   Procedure: CT WITH ANESTHESIA LEFT RENAL CRYOABLATION AND BIOPSY;  Surgeon: Lucinda Saber, MD;  Location: [facility];  Service: Anesthesiology;  Laterality: Left;   RIGHT ROTATOR  CUFF REPAIR/   BICEP REPAIR  10-17-2011  DR SUPPLE   SUBTOTAL COLECTOMY  2007   URETEROSCOPY WITH HOLMIUM LASER LITHOTRIPSY Left 07/14/2014   Procedure: URETEROSCOPY  EXTRACTION OF STONES;  Surgeon: Trent Frizzle, MD;  Location: [facility];  Service: Urology;  Laterality: Left;   Family History  Problem Relation Age of Onset   Cancer Son    Early death Son    Cancer Mother    Cancer Maternal Grandfather    Social History   Socioeconomic History   Marital status: Divorced    Spouse name: Not on file   Number of children: Not on file   Years of education: Not on file   Highest education level: 12th grade  Occupational History   Not on file  Tobacco Use   Smoking status: Never   Smokeless tobacco: Never  Vaping Use   Vaping status: Never Used  Substance and Sexual Activity   Alcohol use: Not Currently    Comment: 2 glasses of wine per year - maybe   Drug use: Never   Sexual activity: Not Currently  Other Topics Concern   Not on file  Social History Narrative   Not on file   Social Drivers of Health   Financial Resource Strain: Low Risk  (07/30/2023)   Overall Financial Resource Strain (CARDIA)    Difficulty of Paying Living Expenses: Not very hard  Food Insecurity: No Food Insecurity (07/30/2023)   Hunger Vital Sign    Worried About Running Out of Food in the Last Year: Never true    Ran Out of Food in the Last Year: Never true  Transportation Needs: No Transportation Needs (07/30/2023)   PRAPARE -  Administrator, Civil Service (Medical): No    Lack of Transportation (Non-Medical): No  Physical Activity: Insufficiently Active (07/27/2023)   Exercise Vital Sign    Days of Exercise per Week: 3 days    Minutes of Exercise per Session: 30 min  Stress: No Stress Concern Present (07/30/2023)   [employer] of Occupational Health - Occupational Stress Questionnaire    Feeling of Stress : Not at all  Social Connections: Moderately  Integrated (07/30/2023)   Social Connection and Isolation Panel [NHANES]    Frequency of Communication with Friends and Family: More than three times a week    Frequency of Social Gatherings with Friends and Family: Twice a week    Attends Religious Services: More than 4 times per year    Active Member of [employer] or Organizations: Yes    Attends Engineer, structural: More than 4 times per year    Marital Status: Divorced    Tobacco Counseling Counseling given: Not Answered    Clinical Intake:  Pre-visit preparation completed: Yes  Pain : No/denies pain     Diabetes: No  Lab Results  Component Value Date   HGBA1C 5.8 (H) 02/11/2023     How often do you need to have someone help you when you read instructions, pamphlets, or other written materials from your doctor or pharmacy?: 1 - Never  Interpreter Needed?: No  Information entered by :: Seabron Cypress LPN   Activities of Daily Living     07/30/2023    3:36 PM  In your present state of health, do you have any difficulty performing the following activities:  Hearing? 0  Vision? 0  Difficulty concentrating or making decisions? 0  Walking or climbing stairs? 0  Dressing or bathing? 0  Doing errands, shopping? 0  Preparing Food and eating ? N  Using the Toilet? N  In the past six months, have you accidently leaked urine? N  Do you have problems with loss of bowel control? N  Managing your Medications? N  Managing your Finances? N  Housekeeping or managing your  Housekeeping? N    Patient Care Team: Jenelle Mis, FNP as PCP - General (Family Medicine) Trent Frizzle, MD as Consulting Physician (Urology) [facility], [facility]  Eva Hikes, MD as Referring Physician (Dermatology) Paulett Boros, MD as Consulting Physician (Hematology)  Indicate any recent Medical Services you may have received from other than [facility] providers in the past year (date may be approximate).     Assessment:    This is a routine wellness examination for Aunyae.  Hearing/Vision screen Hearing Screening - Comments:: Denies hearing difficulties   Vision Screening - Comments:: Wears rx glasses - up to date with routine eye exams with Dr. Barbra Boone   Goals Addressed             This Visit's Progress    Remain active and independent   On track      Depression Screen     07/30/2023    3:35 PM 02/18/2023   10:07 AM 01/30/2023   12:22 PM 04/06/2020    8:17 AM 03/21/2014   10:25 AM  PHQ 2/9 Scores  PHQ - 2 Score 0 0 0 0 0  PHQ- 9 Score  2 2      Fall Risk     07/30/2023    3:36 PM 02/18/2023   10:07 AM 03/21/2014   10:25 AM  Fall Risk   Falls in the past year? 0 0 No  Number falls in past yr: 0 0   Injury with Fall? 0 0   Risk for fall due to : No Fall Risks    Follow up Falls prevention discussed;Education provided;Falls evaluation completed      MEDICARE RISK AT HOME:  Medicare Risk at Home Any stairs in or around the home?: No If so, are there any without handrails?: No Home free of loose throw rugs in walkways, pet beds,  electrical cords, etc?: Yes Adequate lighting in your home to reduce risk of falls?: Yes Life alert?: No Use of a cane, walker or w/c?: No Grab bars in the bathroom?: Yes Shower chair or bench in shower?: No Elevated toilet seat or a handicapped toilet?: Yes  TIMED UP AND GO:  Was the test performed?  No  Cognitive Function: 6CIT completed        07/30/2023    3:37 PM  6CIT Screen  What  Year? 0 points  What month? 0 points  What time? 0 points  Count back from 20 0 points  Months in reverse 0 points  Repeat phrase 0 points  Total Score 0 points    Immunizations Immunization History  Administered Date(s) Administered   Fluad Quad(high Dose 65+) 12/16/2018   Influenza, High Dose Seasonal PF 01/08/2018   Influenza,inj,Quad PF,6+ Mos 01/09/2016, 12/16/2016   Influenza-Unspecified 01/07/2014, 12/14/2014, 12/15/2022   Moderna Sars-Covid-2 Vaccination 05/12/2019, 06/09/2019, 02/18/2020   Zoster Recombinant(Shingrix ) 11/04/2018, 01/03/2019    Screening Tests Health Maintenance  Topic Date Due   Colonoscopy  Never done   COVID-19 Vaccine (4 - 2024-25 season) 11/17/2022   DTaP/Tdap/Td (1 - Tdap) 02/18/2024 (Originally 07/31/1969)   Pneumonia Vaccine 25+ Years old (1 of 2 - PCV) 02/18/2024 (Originally 07/31/1969)   INFLUENZA VACCINE  10/17/2023   Medicare Annual Wellness (AWV)  07/29/2024   MAMMOGRAM  10/27/2024   COLON CANCER SCREENING 5 YEAR SIGMOIDOSCOPY  01/08/2028   DEXA SCAN  Completed   Hepatitis C Screening  Completed   Zoster Vaccines- Shingrix   Completed   HPV VACCINES  Aged Out   Meningococcal B Vaccine  Aged Out    Health Maintenance  Health Maintenance Due  Topic Date Due   Colonoscopy  Never done   COVID-19 Vaccine (4 - 2024-25 season) 11/17/2022    Additional Screening:  Vision Screening: Recommended annual ophthalmology exams for early detection of glaucoma and other disorders of the eye.  Dental Screening: Recommended annual dental exams for proper oral hygiene  Community Resource Referral / Chronic Care Management: CRR required this visit?  No   CCM required this visit?  No   Plan:    I have personally reviewed and noted the following in the patient's chart:   Medical and social history Use of alcohol, tobacco or illicit drugs  Current medications and supplements including opioid prescriptions. Patient is not currently taking  opioid prescriptions. Functional ability and status Nutritional status Physical activity Advanced directives List of other physicians Hospitalizations, surgeries, and ER visits in previous 12 months Vitals Screenings to include cognitive, depression, and falls Referrals and appointments  In addition, I have reviewed and discussed with patient certain preventive protocols, quality metrics, and best practice recommendations. A written personalized care plan for preventive services as well as general preventive health recommendations were provided to patient.   Seabron Cypress [place], [place]   5/40/9811   After Visit Summary: (MyChart) Due to this being a telephonic visit, the after visit summary with patients personalized plan was offered to patient via MyChart   Notes: Nothing significant to report at this time.

## 2023-07-30 NOTE — Patient Instructions (Signed)
 Nicole [name] , Thank you for taking time out of your busy schedule to complete your Annual Wellness Visit with me. I enjoyed our conversation and look forward to speaking with you again next year. I, as well as your care team,  appreciate your ongoing commitment to your health goals. Please review the following plan we discussed and let me know if I can assist you in the future. Your Game plan/ To Do List    Follow up Visits: Next Medicare AWV with our clinical staff: In 1 year    Have you seen your provider in the last 6 months (3 months if uncontrolled diabetes)? Yes Next Office Visit with your provider: As scheduled  Clinician Recommendations:  Aim for 30 minutes of exercise or brisk walking, 6-8 glasses of water , and 5 servings of fruits and vegetables each day.       This is a list of the screening recommended for you and due dates:  Health Maintenance  Topic Date Due   Colon Cancer Screening  Never done   COVID-19 Vaccine (4 - 2024-25 season) 11/17/2022   DTaP/Tdap/Td vaccine (1 - Tdap) 02/18/2024*   Pneumonia Vaccine (1 of 2 - PCV) 02/18/2024*   Flu Shot  10/17/2023   Medicare Annual Wellness Visit  07/29/2024   Mammogram  10/27/2024   Colon Cancer Screening  01/08/2028   DEXA scan (bone density measurement)  Completed   Hepatitis C Screening  Completed   Zoster (Shingles) Vaccine  Completed   HPV Vaccine  Aged Out   Meningitis B Vaccine  Aged Out  *Topic was postponed. The date shown is not the original due date.    Advanced directives: (ACP Link)Information on Advanced Care Planning can be found at [place]  Secretary of [facility] Advance Health Care Directives Advance Health Care Directives. [email or web address]   Advance Care Planning is important because it:  [x]  Makes sure you receive the medical care that is consistent with your values, goals, and preferences  [x]  It provides guidance to your family and loved ones and reduces their decisional burden about whether or not they  are making the right decisions based on your wishes.  Follow the link provided in your after visit summary or read over the paperwork we have mailed to you to help you started getting your Advance Directives in place. If you need assistance in completing these, please reach out to us  so that we can help you!  See attachments for Preventive Care and Fall Prevention Tips.

## 2023-08-01 ENCOUNTER — Telehealth: Payer: Self-pay | Admitting: Urology

## 2023-08-01 ENCOUNTER — Telehealth: Payer: Self-pay

## 2023-08-01 DIAGNOSIS — N200 Calculus of kidney: Secondary | ICD-10-CM

## 2023-08-01 MED ORDER — POTASSIUM CITRATE ER 15 MEQ (1620 MG) PO TBCR: 1.0000 | EXTENDED_RELEASE_TABLET | Freq: Three times a day (TID) | ORAL | 1 refills | Status: DC

## 2023-08-01 NOTE — Telephone Encounter (Signed)
 Patient is made  aware RX sent in to pharmacy and voiced understanding. Needs potassium citrate  will be out on Sunday

## 2023-08-01 NOTE — Telephone Encounter (Signed)
 Needs potassium citrate  will be out on Sunday

## 2023-08-18 ENCOUNTER — Telehealth: Payer: Self-pay

## 2023-08-18 NOTE — Telephone Encounter (Signed)
 Copied from CRM [id number]. Topic: Clinical - Request for Lab/Test Order >> Aug 18, 2023 11:22 AM Stanly Early wrote: Reason for CRM: I schedule labs appt, sending message to the office as it says to do so in the [facility]. Patient also stated she never got a call about her appt for ultrasound.

## 2023-08-21 ENCOUNTER — Other Ambulatory Visit

## 2023-08-21 DIAGNOSIS — R7989 Other specified abnormal findings of blood chemistry: Secondary | ICD-10-CM

## 2023-08-22 LAB — HEPATIC FUNCTION PANEL
AG Ratio: 1.7 (calc) (ref 1.0–2.5)
ALT: 27 U/L (ref 6–29)
AST: 19 U/L (ref 10–35)
Albumin: 3.9 g/dL (ref 3.6–5.1)
Alkaline phosphatase (APISO): 136 U/L (ref 37–153)
Bilirubin, Direct: 0.1 mg/dL (ref 0.0–0.2)
Globulin: 2.3 g/dL (ref 1.9–3.7)
Indirect Bilirubin: 0.2 mg/dL (ref 0.2–1.2)
Total Bilirubin: 0.3 mg/dL (ref 0.2–1.2)
Total Protein: 6.2 g/dL (ref 6.1–8.1)

## 2023-09-23 ENCOUNTER — Ambulatory Visit (HOSPITAL_COMMUNITY)
Admission: RE | Admit: 2023-09-23 | Discharge: 2023-09-23 | Disposition: A | Discharge status: Home / Self Care | Source: Ambulatory Visit | Attending: Family Medicine | Admitting: Family Medicine

## 2023-09-23 ENCOUNTER — Encounter (INDEPENDENT_AMBULATORY_CARE_PROVIDER_SITE_OTHER): Payer: Self-pay | Admitting: *Deleted

## 2023-09-23 ENCOUNTER — Other Ambulatory Visit: Payer: Self-pay

## 2023-09-23 ENCOUNTER — Encounter (HOSPITAL_COMMUNITY): Payer: Self-pay | Admitting: Hematology

## 2023-09-23 DIAGNOSIS — K8020 Calculus of gallbladder without cholecystitis without obstruction: Secondary | ICD-10-CM | POA: Diagnosis not present

## 2023-09-23 DIAGNOSIS — N200 Calculus of kidney: Secondary | ICD-10-CM | POA: Diagnosis not present

## 2023-09-23 DIAGNOSIS — K838 Other specified diseases of biliary tract: Secondary | ICD-10-CM | POA: Diagnosis not present

## 2023-09-23 DIAGNOSIS — R109 Unspecified abdominal pain: Secondary | ICD-10-CM | POA: Diagnosis not present

## 2023-09-23 DIAGNOSIS — R1013 Epigastric pain: Secondary | ICD-10-CM

## 2023-10-01 ENCOUNTER — Encounter (INDEPENDENT_AMBULATORY_CARE_PROVIDER_SITE_OTHER): Payer: Self-pay | Admitting: Gastroenterology

## 2023-10-01 ENCOUNTER — Encounter (HOSPITAL_COMMUNITY): Payer: Self-pay

## 2023-10-01 ENCOUNTER — Emergency Department (HOSPITAL_COMMUNITY)

## 2023-10-01 ENCOUNTER — Ambulatory Visit (INDEPENDENT_AMBULATORY_CARE_PROVIDER_SITE_OTHER): Admitting: Gastroenterology

## 2023-10-01 ENCOUNTER — Inpatient Hospital Stay (HOSPITAL_COMMUNITY)
Admission: EM | Admit: 2023-10-01 | Discharge: 2023-10-08 | DRG: 375 | Disposition: A | Discharge status: Home / Self Care | Source: Ambulatory Visit | Attending: Internal Medicine | Admitting: Internal Medicine

## 2023-10-01 ENCOUNTER — Other Ambulatory Visit: Payer: Self-pay

## 2023-10-01 VITALS — BP 138/80 | HR 96 | Temp 98.2°F | Ht 60.5 in | Wt 189.8 lb

## 2023-10-01 DIAGNOSIS — Z87442 Personal history of urinary calculi: Secondary | ICD-10-CM | POA: Diagnosis not present

## 2023-10-01 DIAGNOSIS — K8071 Calculus of gallbladder and bile duct without cholecystitis with obstruction: Secondary | ICD-10-CM | POA: Diagnosis present

## 2023-10-01 DIAGNOSIS — C649 Malignant neoplasm of unspecified kidney, except renal pelvis: Secondary | ICD-10-CM | POA: Diagnosis present

## 2023-10-01 DIAGNOSIS — N200 Calculus of kidney: Secondary | ICD-10-CM | POA: Diagnosis not present

## 2023-10-01 DIAGNOSIS — Z91048 Other nonmedicinal substance allergy status: Secondary | ICD-10-CM | POA: Diagnosis not present

## 2023-10-01 DIAGNOSIS — C179 Malignant neoplasm of small intestine, unspecified: Secondary | ICD-10-CM | POA: Diagnosis not present

## 2023-10-01 DIAGNOSIS — Z9049 Acquired absence of other specified parts of digestive tract: Secondary | ICD-10-CM

## 2023-10-01 DIAGNOSIS — Z8614 Personal history of Methicillin resistant Staphylococcus aureus infection: Secondary | ICD-10-CM

## 2023-10-01 DIAGNOSIS — R748 Abnormal levels of other serum enzymes: Secondary | ICD-10-CM | POA: Diagnosis not present

## 2023-10-01 DIAGNOSIS — Z800 Family history of malignant neoplasm of digestive organs: Secondary | ICD-10-CM | POA: Diagnosis not present

## 2023-10-01 DIAGNOSIS — Z90710 Acquired absence of both cervix and uterus: Secondary | ICD-10-CM

## 2023-10-01 DIAGNOSIS — K3189 Other diseases of stomach and duodenum: Secondary | ICD-10-CM | POA: Diagnosis not present

## 2023-10-01 DIAGNOSIS — R918 Other nonspecific abnormal finding of lung field: Secondary | ICD-10-CM | POA: Diagnosis not present

## 2023-10-01 DIAGNOSIS — N390 Urinary tract infection, site not specified: Secondary | ICD-10-CM | POA: Diagnosis not present

## 2023-10-01 DIAGNOSIS — E538 Deficiency of other specified B group vitamins: Secondary | ICD-10-CM | POA: Diagnosis not present

## 2023-10-01 DIAGNOSIS — Z79899 Other long term (current) drug therapy: Secondary | ICD-10-CM | POA: Diagnosis not present

## 2023-10-01 DIAGNOSIS — K828 Other specified diseases of gallbladder: Secondary | ICD-10-CM | POA: Diagnosis present

## 2023-10-01 DIAGNOSIS — K8050 Calculus of bile duct without cholangitis or cholecystitis without obstruction: Secondary | ICD-10-CM | POA: Insufficient documentation

## 2023-10-01 DIAGNOSIS — K269 Duodenal ulcer, unspecified as acute or chronic, without hemorrhage or perforation: Secondary | ICD-10-CM | POA: Diagnosis present

## 2023-10-01 DIAGNOSIS — Z1509 Genetic susceptibility to other malignant neoplasm: Secondary | ICD-10-CM

## 2023-10-01 DIAGNOSIS — Z885 Allergy status to narcotic agent status: Secondary | ICD-10-CM

## 2023-10-01 DIAGNOSIS — E782 Mixed hyperlipidemia: Secondary | ICD-10-CM | POA: Diagnosis not present

## 2023-10-01 DIAGNOSIS — Z85048 Personal history of other malignant neoplasm of rectum, rectosigmoid junction, and anus: Secondary | ICD-10-CM

## 2023-10-01 DIAGNOSIS — K838 Other specified diseases of biliary tract: Secondary | ICD-10-CM | POA: Insufficient documentation

## 2023-10-01 DIAGNOSIS — K831 Obstruction of bile duct: Secondary | ICD-10-CM | POA: Diagnosis present

## 2023-10-01 DIAGNOSIS — C8293 Follicular lymphoma, unspecified, intra-abdominal lymph nodes: Secondary | ICD-10-CM | POA: Diagnosis not present

## 2023-10-01 DIAGNOSIS — E669 Obesity, unspecified: Secondary | ICD-10-CM | POA: Diagnosis not present

## 2023-10-01 DIAGNOSIS — I700 Atherosclerosis of aorta: Secondary | ICD-10-CM | POA: Diagnosis not present

## 2023-10-01 DIAGNOSIS — Z905 Acquired absence of kidney: Secondary | ICD-10-CM | POA: Diagnosis not present

## 2023-10-01 DIAGNOSIS — K267 Chronic duodenal ulcer without hemorrhage or perforation: Secondary | ICD-10-CM | POA: Diagnosis not present

## 2023-10-01 DIAGNOSIS — R509 Fever, unspecified: Secondary | ICD-10-CM | POA: Diagnosis not present

## 2023-10-01 DIAGNOSIS — B9620 Unspecified Escherichia coli [E. coli] as the cause of diseases classified elsewhere: Secondary | ICD-10-CM | POA: Diagnosis not present

## 2023-10-01 DIAGNOSIS — K8031 Calculus of bile duct with cholangitis, unspecified, with obstruction: Secondary | ICD-10-CM | POA: Diagnosis present

## 2023-10-01 DIAGNOSIS — Z85828 Personal history of other malignant neoplasm of skin: Secondary | ICD-10-CM | POA: Diagnosis not present

## 2023-10-01 DIAGNOSIS — K8020 Calculus of gallbladder without cholecystitis without obstruction: Secondary | ICD-10-CM | POA: Diagnosis not present

## 2023-10-01 DIAGNOSIS — E66812 Obesity, class 2: Secondary | ICD-10-CM | POA: Diagnosis not present

## 2023-10-01 DIAGNOSIS — Z85038 Personal history of other malignant neoplasm of large intestine: Secondary | ICD-10-CM | POA: Diagnosis present

## 2023-10-01 DIAGNOSIS — Z91040 Latex allergy status: Secondary | ICD-10-CM

## 2023-10-01 DIAGNOSIS — C170 Malignant neoplasm of duodenum: Secondary | ICD-10-CM | POA: Diagnosis not present

## 2023-10-01 DIAGNOSIS — R590 Localized enlarged lymph nodes: Secondary | ICD-10-CM | POA: Diagnosis not present

## 2023-10-01 DIAGNOSIS — D630 Anemia in neoplastic disease: Secondary | ICD-10-CM | POA: Diagnosis not present

## 2023-10-01 DIAGNOSIS — Z6836 Body mass index (BMI) 36.0-36.9, adult: Secondary | ICD-10-CM | POA: Diagnosis not present

## 2023-10-01 DIAGNOSIS — Z85528 Personal history of other malignant neoplasm of kidney: Secondary | ICD-10-CM | POA: Diagnosis not present

## 2023-10-01 DIAGNOSIS — R1013 Epigastric pain: Secondary | ICD-10-CM | POA: Diagnosis present

## 2023-10-01 DIAGNOSIS — R7989 Other specified abnormal findings of blood chemistry: Secondary | ICD-10-CM | POA: Diagnosis not present

## 2023-10-01 DIAGNOSIS — C8590 Non-Hodgkin lymphoma, unspecified, unspecified site: Secondary | ICD-10-CM | POA: Diagnosis present

## 2023-10-01 LAB — COMPREHENSIVE METABOLIC PANEL WITH GFR
ALT: 176 U/L — ABNORMAL HIGH (ref 0–44)
AST: 79 U/L — ABNORMAL HIGH (ref 15–41)
Albumin: 3.6 g/dL (ref 3.5–5.0)
Alkaline Phosphatase: 391 U/L — ABNORMAL HIGH (ref 38–126)
Anion gap: 13 (ref 5–15)
BUN: 22 mg/dL (ref 8–23)
CO2: 23 mmol/L (ref 22–32)
Calcium: 9.5 mg/dL (ref 8.9–10.3)
Chloride: 102 mmol/L (ref 98–111)
Creatinine, Ser: 0.92 mg/dL (ref 0.44–1.00)
GFR, Estimated: >60 mL/min (ref >60)
Glucose, Bld: 96 mg/dL (ref 70–99)
Potassium: 4.3 mmol/L (ref 3.5–5.1)
Sodium: 138 mmol/L (ref 135–145)
Total Bilirubin: 0.6 mg/dL (ref 0.0–1.2)
Total Protein: 7.4 g/dL (ref 6.5–8.1)

## 2023-10-01 LAB — CBC WITH DIFFERENTIAL/PLATELET
Abs Immature Granulocytes: 0.04 K/uL (ref 0.00–0.07)
Basophils Absolute: 0.1 K/uL (ref 0.0–0.1)
Basophils Relative: 1 %
Eosinophils Absolute: 0.1 K/uL (ref 0.0–0.5)
Eosinophils Relative: 2 %
HCT: 42.2 % (ref 36.0–46.0)
Hemoglobin: 13.5 g/dL (ref 12.0–15.0)
Immature Granulocytes: 1 %
Lymphocytes Relative: 10 %
Lymphs Abs: 0.9 K/uL (ref 0.7–4.0)
MCH: 28.9 pg (ref 26.0–34.0)
MCHC: 32 g/dL (ref 30.0–36.0)
MCV: 90.4 fL (ref 80.0–100.0)
Monocytes Absolute: 1.1 K/uL — ABNORMAL HIGH (ref 0.1–1.0)
Monocytes Relative: 12 %
Neutro Abs: 6.3 K/uL (ref 1.7–7.7)
Neutrophils Relative %: 74 %
Platelets: 218 K/uL (ref 150–400)
RBC: 4.67 MIL/uL (ref 3.87–5.11)
RDW: 15.3 % (ref 11.5–15.5)
WBC: 8.5 K/uL (ref 4.0–10.5)
nRBC: 0 % (ref 0.0–0.2)

## 2023-10-01 LAB — URINALYSIS, ROUTINE W REFLEX MICROSCOPIC
Bilirubin Urine: NEGATIVE
Glucose, UA: NEGATIVE mg/dL
Ketones, ur: NEGATIVE mg/dL
Nitrite: NEGATIVE
Protein, ur: 100 mg/dL — AB
Specific Gravity, Urine: 1.024 (ref 1.005–1.030)
WBC, UA: >50 WBC/hpf (ref 0–5)
pH: 5 (ref 5.0–8.0)

## 2023-10-01 LAB — LIPASE, BLOOD: Lipase: 41 U/L (ref 11–51)

## 2023-10-01 MED ORDER — DIPHENOXYLATE-ATROPINE 2.5-0.025 MG PO TABS: 1.0000 | ORAL_TABLET | Freq: Four times a day (QID) | ORAL | Status: DC | PRN

## 2023-10-01 MED ORDER — PIPERACILLIN-TAZOBACTAM 3.375 G IVPB
3.3750 g | Freq: Three times a day (TID) | INTRAVENOUS | Status: DC
  Administered 2023-10-02 – 2023-10-05 (×10): 3.375 g via INTRAVENOUS
  Filled 2023-10-01 (×10): qty 50

## 2023-10-01 MED ORDER — SODIUM CHLORIDE 0.9 % IV BOLUS
1000.0000 mL | Freq: Once | INTRAVENOUS | Status: AC
  Administered 2023-10-01: 1000 mL via INTRAVENOUS

## 2023-10-01 MED ORDER — ACETAMINOPHEN 500 MG PO TABS: 1000.0000 mg | ORAL_TABLET | Freq: Three times a day (TID) | ORAL | Status: DC | PRN

## 2023-10-01 MED ORDER — PIPERACILLIN-TAZOBACTAM 3.375 G IVPB 30 MIN
3.3750 g | Freq: Once | INTRAVENOUS | Status: AC
  Administered 2023-10-01: 3.375 g via INTRAVENOUS
  Filled 2023-10-01: qty 50

## 2023-10-01 MED ORDER — ENOXAPARIN SODIUM 40 MG/0.4ML IJ SOSY
40.0000 mg | PREFILLED_SYRINGE | INTRAMUSCULAR | Status: DC
  Administered 2023-10-02: 40 mg via SUBCUTANEOUS
  Filled 2023-10-01: qty 0.4

## 2023-10-01 MED ORDER — GADOBUTROL 1 MMOL/ML IV SOLN
9.0000 mL | Freq: Once | INTRAVENOUS | Status: AC | PRN
  Administered 2023-10-01: 9 mL via INTRAVENOUS

## 2023-10-01 NOTE — ED Provider Notes (Signed)
 [place] EMERGENCY DEPARTMENT AT [facility] Provider Note   CSN: [id number] Arrival date & time: 10/01/23  1152     Patient presents with: Abdominal Pain   Nicole [name] is a 73 y.o. female.   Patient is a 73 year old female who presents to the emergency department secondary to intermittent fevers over the past 3 days as well as intermittent epigastric pain.  She notes that she was seen by her gastroenterologist just prior to arrival and sent to the emergency department for MRCP.  Patient does note that she has felt the best today compared to the previous 3 days.  Patient notes that she did have a mild fever this morning which has improved.  She denies any associated nausea, vomiting, diarrhea.  She has had no dizziness, lightheadedness or syncope.  She denies any chest pain or shortness of breath.  She denies any dysuria or hematuria.   Abdominal Pain Associated symptoms: fever        Prior to Admission medications   Medication Sig Start Date End Date Taking? Authorizing Provider  Cholecalciferol  (VITAMIN D3) 125 MCG (5000 UT) CAPS Take 5,000 Units by mouth daily.    [provider]  cyanocobalamin  (VITAMIN B12) 1000 MCG/ML injection ADMINISTER 1 ML(1000 MCG) IN THE MUSCLE EVERY 21 DAYS 10/31/22   Rogers Hai, MD  diphenoxylate -atropine  (LOMOTIL ) 2.5-0.025 MG tablet Take 1 tablet by mouth 4 (four) times daily as needed for diarrhea or loose stools. 11/05/21   Rogers Hai, MD  furosemide  (LASIX ) 40 MG tablet Take 40 mg by mouth daily as needed for fluid.     [provider]  ibuprofen  (ADVIL ,MOTRIN ) 800 MG tablet Take 800 mg by mouth 3 (three) times daily as needed for moderate pain. 08/01/16   [provider]  magnesium oxide (MAG-OX) 400 (240 Mg) MG tablet Take 400 mg by mouth daily.    [provider]  NON FORMULARY Biotin , per pt don't recall dose.    [provider]  Potassium Citrate  15 MEQ (1620 MG) TBCR  Take 1 tablet by mouth 3 (three) times daily. 08/01/23   Matilda Senior, MD    Allergies: Latex, Morphine and codeine, and Tape    Review of Systems  Constitutional:  Positive for fever.  Gastrointestinal:  Positive for abdominal pain.  All other systems reviewed and are negative.   Updated Vital Signs BP (!) 155/85   Pulse 76   Temp 98.1 F (36.7 C) (Oral)   Resp 16   Ht 5' 0.5 (1.537 m)   Wt 86.1 kg   SpO2 99%   BMI 36.46 kg/m   Physical Exam Vitals and nursing note reviewed.  Constitutional:      Appearance: Normal appearance.  HENT:     Head: Normocephalic and atraumatic.     Nose: Nose normal.     Mouth/Throat:     Mouth: Mucous membranes are moist.  Eyes:     Extraocular Movements: Extraocular movements intact.     Conjunctiva/sclera: Conjunctivae normal.     Pupils: Pupils are equal, round, and reactive to light.  Cardiovascular:     Rate and Rhythm: Normal rate and regular rhythm.     Pulses: Normal pulses.     Heart sounds: Normal heart sounds. No murmur heard.    No gallop.  Pulmonary:     Effort: Pulmonary effort is normal. No respiratory distress.     Breath sounds: Normal breath sounds. No stridor. No wheezing, rhonchi or rales.  Abdominal:  General: Abdomen is flat. Bowel sounds are normal. There is no distension. There are no signs of injury.     Palpations: Abdomen is soft. There is no hepatomegaly.     Tenderness: There is no abdominal tenderness. Negative signs include Murphy's sign and McBurney's sign.  Musculoskeletal:        General: Normal range of motion.     Cervical back: Normal range of motion and neck supple.  Skin:    General: Skin is warm and dry.  Neurological:     General: No focal deficit present.     Mental Status: She is alert and oriented to person, place, and time. Mental status is at baseline.  Psychiatric:        Mood and Affect: Mood normal.        Behavior: Behavior normal.        Thought Content: Thought  content normal.        Judgment: Judgment normal.     (all labs ordered are listed, but only abnormal results are displayed) Labs Reviewed  COMPREHENSIVE METABOLIC PANEL WITH GFR - Abnormal; Notable for the following components:      Result Value   AST 79 (*)    ALT 176 (*)    Alkaline Phosphatase 391 (*)    All other components within normal limits  URINALYSIS, ROUTINE W REFLEX MICROSCOPIC - Abnormal; Notable for the following components:   Color, Urine AMBER (*)    APPearance CLOUDY (*)    Hgb urine dipstick SMALL (*)    Protein, ur 100 (*)    Leukocytes,Ua MODERATE (*)    Bacteria, UA MANY (*)    Non Squamous Epithelial 0-5 (*)    All other components within normal limits  CBC WITH DIFFERENTIAL/PLATELET - Abnormal; Notable for the following components:   Monocytes Absolute 1.1 (*)    All other components within normal limits  URINE CULTURE  LIPASE, BLOOD    EKG: None  Radiology: MR ABDOMEN MRCP W WO CONTAST Result Date: 10/01/2023 CLINICAL DATA:  Cholelithiasis history of ascending colon cancer EXAM: MRI ABDOMEN WITHOUT AND WITH CONTRAST (INCLUDING MRCP) TECHNIQUE: Multiplanar multisequence MR imaging of the abdomen was performed both before and after the administration of intravenous contrast. Heavily T2-weighted images of the biliary and pancreatic ducts were obtained, and three-dimensional MRCP images were rendered by post processing. CONTRAST:  9mL GADAVIST  GADOBUTROL  1 MMOL/ML IV SOLN COMPARISON:  Right upper quadrant ultrasound, 09/23/2023 CT abdomen pelvis, 10/30/2022 FINDINGS: Lower chest: No acute abnormality. Hepatobiliary: No solid liver abnormality is seen. Distended gallbladder containing numerous gallstones. Severe intra and extrahepatic biliary ductal dilatation, the common bile duct measuring up to 1.9 cm in caliber and containing multiple small gallstones dependently (series 6, image 21). The duct however appears to taper to the ampulla without calculus of the  ampulla (series 5, image 15). Pancreas: Unremarkable. No pancreatic ductal dilatation or surrounding inflammatory changes. Spleen: Normal in size without significant abnormality. Adrenals/Urinary Tract: Adrenal glands are unremarkable. Unchanged extensive left renal cortical scarring. Right kidney is normal, without obvious renal calculi, solid lesion, or hydronephrosis. Stomach/Bowel: Stomach is within normal limits. No evidence of bowel wall thickening, distention, or inflammatory changes. Status post partial right hemicolectomy. Vascular/Lymphatic: Aortic atherosclerosis. Enlarged retroperitoneal lymph nodes unchanged when compared to examination dated 10/30/2022, left retroperitoneal nodes measuring up 2 1.7 x 1.2 cm (series 17, image 51). Other: No abdominal wall hernia or abnormality. No ascites. Musculoskeletal: No acute or significant osseous findings. IMPRESSION: 1. Distended gallbladder containing  numerous gallstones. 2. Severe intra and extrahepatic biliary ductal dilatation, the common bile duct measuring up to 1.9 cm in caliber and containing multiple small gallstones dependently. The duct however appears to taper to the ampulla without calculus of the ampulla. Findings are highly suspicious for ampullary stricture or perhaps an occult mass. Notably, biliary ductal dilatation is new when compared to relatively recent prior CT dated 10/30/2022. Consider ERCP for further assessment. 3. Enlarged retroperitoneal lymph nodes unchanged when compared to examination dated 10/30/2022, left retroperitoneal nodes measuring up to 1.7 x 1.2 cm. 4. Status post partial right hemicolectomy. 5. Unchanged extensive left renal cortical scarring. Electronically Signed   By: Marolyn JONETTA Jaksch M.D.   On: 10/01/2023 16:01   MR 3D Recon At Scanner Result Date: 10/01/2023 CLINICAL DATA:  Cholelithiasis history of ascending colon cancer EXAM: MRI ABDOMEN WITHOUT AND WITH CONTRAST (INCLUDING MRCP) TECHNIQUE: Multiplanar  multisequence MR imaging of the abdomen was performed both before and after the administration of intravenous contrast. Heavily T2-weighted images of the biliary and pancreatic ducts were obtained, and three-dimensional MRCP images were rendered by post processing. CONTRAST:  9mL GADAVIST  GADOBUTROL  1 MMOL/ML IV SOLN COMPARISON:  Right upper quadrant ultrasound, 09/23/2023 CT abdomen pelvis, 10/30/2022 FINDINGS: Lower chest: No acute abnormality. Hepatobiliary: No solid liver abnormality is seen. Distended gallbladder containing numerous gallstones. Severe intra and extrahepatic biliary ductal dilatation, the common bile duct measuring up to 1.9 cm in caliber and containing multiple small gallstones dependently (series 6, image 21). The duct however appears to taper to the ampulla without calculus of the ampulla (series 5, image 15). Pancreas: Unremarkable. No pancreatic ductal dilatation or surrounding inflammatory changes. Spleen: Normal in size without significant abnormality. Adrenals/Urinary Tract: Adrenal glands are unremarkable. Unchanged extensive left renal cortical scarring. Right kidney is normal, without obvious renal calculi, solid lesion, or hydronephrosis. Stomach/Bowel: Stomach is within normal limits. No evidence of bowel wall thickening, distention, or inflammatory changes. Status post partial right hemicolectomy. Vascular/Lymphatic: Aortic atherosclerosis. Enlarged retroperitoneal lymph nodes unchanged when compared to examination dated 10/30/2022, left retroperitoneal nodes measuring up 2 1.7 x 1.2 cm (series 17, image 51). Other: No abdominal wall hernia or abnormality. No ascites. Musculoskeletal: No acute or significant osseous findings. IMPRESSION: 1. Distended gallbladder containing numerous gallstones. 2. Severe intra and extrahepatic biliary ductal dilatation, the common bile duct measuring up to 1.9 cm in caliber and containing multiple small gallstones dependently. The duct however  appears to taper to the ampulla without calculus of the ampulla. Findings are highly suspicious for ampullary stricture or perhaps an occult mass. Notably, biliary ductal dilatation is new when compared to relatively recent prior CT dated 10/30/2022. Consider ERCP for further assessment. 3. Enlarged retroperitoneal lymph nodes unchanged when compared to examination dated 10/30/2022, left retroperitoneal nodes measuring up to 1.7 x 1.2 cm. 4. Status post partial right hemicolectomy. 5. Unchanged extensive left renal cortical scarring. Electronically Signed   By: Marolyn JONETTA Jaksch M.D.   On: 10/01/2023 16:01     Procedures   Medications Ordered in the ED  piperacillin -tazobactam (ZOSYN ) IVPB 3.375 g ( Intravenous Infusion Verify 10/01/23 1641)  gadobutrol  (GADAVIST ) 1 MMOL/ML injection 9 mL (9 mLs Intravenous Contrast Given 10/01/23 1503)  sodium chloride  0.9 % bolus 1,000 mL (1,000 mLs Intravenous New Bag/Given 10/01/23 1634)                                    Medical Decision Making  Amount and/or Complexity of Data Reviewed Labs: ordered. Radiology: ordered.  Risk Prescription drug management. Decision regarding hospitalization.   This patient presents to the ED for concern of epigastric pain and fever, this involves an extensive number of treatment options, and is a complaint that carries with it a high risk of complications and morbidity.  The differential diagnosis includes acute cholecystitis, choledocholithiasis, cholangitis, pancreatitis, appendicitis, diverticulitis, small bowel obstruction, mesenteric ischemia   Co morbidities that complicate the patient evaluation  History of gallstones   Additional history obtained:  Additional history obtained from medical records External records from outside source obtained and reviewed including medical records   Lab Tests:  I Ordered, and personally interpreted labs.  The pertinent results include: No leukocytosis, no anemia, normal  kidney function, normal electrolytes, elevated AST, ALT, alkaline phosphatase, normal bilirubin, normal lipase, urinalysis with moderate leukocytes and many bacteria   Imaging Studies ordered:  I ordered imaging studies including MRCP I independently visualized and interpreted imaging which showed intrahepatic and extrahepatic biliary ductal dilation, gallstones noted I agree with the radiologist interpretation    Consultations Obtained:  I requested consultation with the gastroenterology, [facility],  and discussed lab and imaging findings as well as pertinent plan - they recommend: Admission to [facility] for ERCP   Problem List / ED Course / Critical interventions / Medication management  Patient is doing well at this time and does remain stable.  Patient notes that she feels very well at this time with no associate abdominal pain or discomfort.  Vital signs are stable in this point with no indication for sepsis.  She has no associated leukocytosis.  Did cover with antibiotics at this time.  Urinalysis with possible infection though patient is asymptomatic in this regard and will send culture at this point.  Did provide IV fluids as well.  Discussed patient case with [facility] who did recommend admission to [facility] for ERCP.  Patient is in agreement to this plan at this time.  Did discuss patient case with Dr. Bryn with the hospitalist who has excepted for admission. I ordered medication including Zosyn , IV fluids for choledocholithiasis Reevaluation of the patient after these medicines showed that the patient improved I have reviewed the patients home medicines and have made adjustments as needed   Social Determinants of Health:  None   Test / Admission - Considered:  Admission     Final diagnoses:  Elevated liver enzymes  Biliary obstruction  Fever, unspecified fever cause  Lower urinary tract infectious disease    ED Discharge Orders     None           Daralene Lonni BIRCH, PA-C 10/01/23 1700    Francesca Elsie CROME, MD 10/01/23 2100

## 2023-10-01 NOTE — Patient Instructions (Signed)
 It was very nice to meet you today, as dicussed with will plan for the following :  1) go to ED for labwork and MRI/MRCP  2) upper endoscopy and flexible sigmoidoscopy

## 2023-10-01 NOTE — ED Triage Notes (Signed)
 Pt arrived via POV epigastric abdominal pain, and a fever that has been gradually worsening over the past few days. Pt denies N/V but does endorse some loose stools. Pt reports highest temperature at home was 101.1F yesterday.

## 2023-10-01 NOTE — Progress Notes (Signed)
 Delford Wingert Faizan Cesiah Westley , M.D. Gastroenterology & Hepatology [facility] Gastroenterology [address] [place], [place] [address] Primary Care Physician: [name] [name], FNP [address] [place] [address]  Chief Complaint: Abdominal discomfort ,fever and ultrasound with dilated CBD  History of Present Illness: Nicole GRIMMETT is a 73 y.o. female with history of colon carcinoma at 2 different times was eventually diagnosed with Lynch syndrome who also has history of renal cell carcinoma as well as lymphoma who who opted not to have her rectum taken out at her's second surgery (status post subtotal colectomy and has distal sigmoid colon and rectum in situ. ) presents for  management of Lynch syndrome  and Ultrasound with CBD dilation  Last flexible sigmoidoscopy by Dr. Golda in 2021, and was going yearly flexible sigmoidoscopy  Patient reports for last few weeks she has been having abdominal discomfort located epigastric area but since Sunday she has been having intermittent fevers Tmax 101.  Reported last fever episode was this morning.  No generalized eyes yellowing or itching.  The patient denies having any nausea, vomiting, fever, chills, hematochezia, melena, hematemesis, diarrhea, jaundice, pruritus or weight loss.  Labs from 08/2023 normal liver enzymes which were previously elevated hemoglobin 12.4  Last ZHI:wnwz Last Colonoscopy:2021  - The terminal ileum is normal. - Patent end- to- end ileo- rectal anastomosis, characterized by healthy appearing mucosa. - The rectum and recto- sigmoid colon are normal. - Internal hemorrhoids. - No specimens collected.   FHx:  Family history significant for colon carcinoma and mother and maternal aunt and her son died in his early 29s.  He had duodenal/pancreatic carcinoma at 26 followed by lung carcinoma and colon carcinoma in his early 30s.  Social: neg smoking, alcohol or illicit drug use Surgical: TAH,  colectomy   Past Medical History: Past Medical History:  Diagnosis Date   Allergy ?   Anemia    Arthritis    shorulder   B12 deficiency    Chronic diarrhea SECONDARY HX COLON CANCER   History of colon cancer [facility] SYNDROME ---  COLORECTAL CANCER S/P COLECTOMY X3  LAST ONE 2007   NO RECURRENCE   History of colon cancer 05/04/2013   History of kidney infection 08/2013   History of kidney stones    History of MRSA infection 08/2013   left leg   Hx of osteopenia    Hyperlipidemia ?   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   NHL (non-Hodgkin's lymphoma) (HCC) 10/17/2010   MONITORED BY DR PERNELL   Personal history of renal cancer S/P LEFT PARTIAL NEPHRECTOMY 2007--  NO RECURRENCE   DUE TO KIDNEY CANCER   Right shoulder pain S/P ROTATOR CUFF REPAIR   Skin cancer     Past Surgical History: Past Surgical History:  Procedure Laterality Date   ABDOMINAL HYSTERECTOMY     APPENDECTOMY  1993   W/ LAVH   COLON SURGERY  2001,2002,2007   COLONOSCOPY W/ POLYPECTOMY     CYSTOSCOPY W/ RETROGRADES  07/14/2014   Procedure: CYSTOSCOPY WITH RETROGRADE PYELOGRAM;  Surgeon: Garnette Shack, MD;  Location: [facility];  Service: Urology;;   CYSTOSCOPY W/ URETERAL STENT REMOVAL Left 07/14/2014   Procedure: CYSTOSCOPY WITH STENT REMOVAL;  Surgeon: Garnette Shack, MD;  Location: [facility];  Service: Urology;  Laterality: Left;   CYSTOSCOPY WITH RETROGRADE PYELOGRAM, URETEROSCOPY AND STENT PLACEMENT Left 11/30/2020   Procedure: CYSTOSCOPY WITH  RETROGRADE PYELOGRAM, URETEROSCOPY/STONE EXTRACTION AND STENT PLACEMENT;  Surgeon: Matilda Senior, MD;  Location: [facility];  Service: Urology;  Laterality: Left;   CYSTOSCOPY WITH RETROGRADE PYELOGRAM, URETEROSCOPY AND STENT PLACEMENT Left 06/24/2022   Procedure: CYSTOSCOPY WITH RETROGRADE PYELOGRAM, URETEROSCOPY AND STENT PLACEMENT;  Surgeon: Matilda Senior, MD;  Location: [facility];  Service: Urology;  Laterality: Left;   CYSTOSCOPY WITH STENT PLACEMENT  02/07/2012   Procedure: CYSTOSCOPY WITH STENT PLACEMENT;  Surgeon: Senior Matilda, MD;  Location: [facility];  Service: Urology;  Laterality: Left;   CYSTOSCOPY WITH STENT PLACEMENT Left 07/14/2014   Procedure: CYSTOSCOPY WITH STENT PLACEMENT;  Surgeon: Senior Matilda, MD;  Location: [facility];  Service: Urology;  Laterality: Left;   EYE SURGERY  ?   Removal of styes   FLEXIBLE SIGMOIDOSCOPY  08/29/2011   Procedure: FLEXIBLE SIGMOIDOSCOPY;  Surgeon: Claudis RAYMOND Rivet, MD;  [facility];  Service: Endoscopy;  Laterality: N/A;  930   FLEXIBLE SIGMOIDOSCOPY N/A 09/28/2012   Procedure: FLEXIBLE SIGMOIDOSCOPY;  Surgeon: Claudis RAYMOND Rivet, MD;  [facility];  Service: Endoscopy;  Laterality: N/A;  730   FLEXIBLE SIGMOIDOSCOPY N/A 07/08/2014   Procedure: FLEXIBLE SIGMOIDOSCOPY;  Surgeon: Claudis RAYMOND Rivet, MD;  [facility];  Service: Endoscopy;  Laterality: N/A;  830 - moved to 10:20 - Ann to notify pt   FLEXIBLE SIGMOIDOSCOPY N/A 08/11/2015   Procedure: FLEXIBLE SIGMOIDOSCOPY;  Surgeon: Claudis RAYMOND Rivet, MD;  [facility];  Service: Endoscopy;  Laterality: N/A;  730   FLEXIBLE SIGMOIDOSCOPY N/A 08/13/2016   Procedure: FLEXIBLE SIGMOIDOSCOPY;  Surgeon: Rivet Claudis RAYMOND, MD;  [facility];  Service: Endoscopy;  Laterality: N/A;  730   FLEXIBLE SIGMOIDOSCOPY N/A 10/06/2019   Procedure: FLEXIBLE SIGMOIDOSCOPY;  Surgeon: Rivet Claudis RAYMOND, MD;  [facility];  Service: Endoscopy;  Laterality: N/A;  1245   HEMICOLECTOMY  2001   right   HEMICOLECTOMY  2002   left   HOLMIUM LASER APPLICATION Left 07/14/2014   Procedure: HOLMIUM LASER LITHOTRIPSY,;  Surgeon: Senior Matilda, MD;  Location: [facility];  Service: Urology;  Laterality: Left;   HOLMIUM LASER APPLICATION Left  11/30/2020   Procedure: HOLMIUM LASER APPLICATION;  Surgeon: Matilda Senior, MD;  Location: [facility];  Service: Urology;  Laterality: Left;   HOLMIUM LASER APPLICATION Left 06/24/2022   Procedure: HOLMIUM LASER APPLICATION;  Surgeon: Matilda Senior, MD;  Location: [facility];  Service: Urology;  Laterality: Left;   IR RADIOLOGIST EVAL & MGMT  04/29/2018   IR RADIOLOGIST EVAL & MGMT  10/07/2019   IR RADIOLOGIST EVAL & MGMT  10/11/2020   IR RADIOLOGIST EVAL & MGMT  11/08/2021   kidney resection  2007   left partial   LAPAROSCOPIC ASSISTED VAGINAL HYSTERECTOMY  1993   W/ BILATERAL SALPINGO-OOPHORECTOMY   LEFT FLANK EXPLORATION W/  PARTIAL LEFT NEPHRECTOMY/ LEFT HILAR LYMPH NODE BX  04-17-2005  DR DAHLSTEDT   RENAL CELL CARCINOMA   LITHOTRIPSY  11-22013   x2   NEPHROLITHOTOMY Left 06/09/2014   Procedure: NEPHROLITHOTOMY PERCUTANEOUS;  Surgeon: Senior Matilda, MD;  Location: [facility];  Service: Urology;  Laterality: Left;  with STENT   RADIOLOGY WITH ANESTHESIA Left 05/20/2018   Procedure: CT WITH ANESTHESIA LEFT RENAL CRYOABLATION AND BIOPSY;  Surgeon: Vanice Sharper, MD;  Location: [facility];  Service: Anesthesiology;  Laterality: Left;   RIGHT ROTATOR  CUFF REPAIR/   BICEP REPAIR  10-17-2011  DR SUPPLE   SUBTOTAL COLECTOMY  2007   URETEROSCOPY WITH HOLMIUM LASER LITHOTRIPSY Left 07/14/2014   Procedure: URETEROSCOPY  EXTRACTION OF STONES;  Surgeon: Garnette Shack, MD;  Location: [facility];  Service: Urology;  Laterality: Left;    Family History: Family History  Problem Relation Age of Onset   Cancer Son    Early death Son    Cancer Mother    Cancer Maternal Grandfather     Social History: Social History   Tobacco Use  Smoking Status Never  Smokeless Tobacco Never   Social History   Substance and Sexual Activity  Alcohol Use Not Currently   Comment: 2 glasses of wine per year - maybe   Social History   Substance  and Sexual Activity  Drug Use Never    Allergies: Allergies  Allergen Reactions   Latex Rash   Morphine And Codeine Other (See Comments)    hallucinations   Tape Rash    Certain tapes     Medications: Current Outpatient Medications  Medication Sig Dispense Refill   Cholecalciferol  (VITAMIN D3) 125 MCG (5000 UT) CAPS Take 5,000 Units by mouth daily.     cyanocobalamin  (VITAMIN B12) 1000 MCG/ML injection ADMINISTER 1 ML(1000 MCG) IN THE MUSCLE EVERY 21 DAYS 1 mL 17   diphenoxylate -atropine  (LOMOTIL ) 2.5-0.025 MG tablet Take 1 tablet by mouth 4 (four) times daily as needed for diarrhea or loose stools. 60 tablet 6   furosemide  (LASIX ) 40 MG tablet Take 40 mg by mouth daily as needed for fluid.      ibuprofen  (ADVIL ,MOTRIN ) 800 MG tablet Take 800 mg by mouth 3 (three) times daily as needed for moderate pain.  11   magnesium oxide (MAG-OX) 400 (240 Mg) MG tablet Take 400 mg by mouth daily.     NON FORMULARY Biotin , per pt don't recall dose.     Potassium Citrate  15 MEQ (1620 MG) TBCR Take 1 tablet by mouth 3 (three) times daily. 255 tablet 1   No current facility-administered medications for this visit.    Review of Systems: GENERAL: negative for malaise, night sweats HEENT: No changes in hearing or vision, no nose bleeds or other nasal problems. NECK: Negative for lumps, goiter, pain and significant neck swelling RESPIRATORY: Negative for cough, wheezing CARDIOVASCULAR: Negative for chest pain, leg swelling, palpitations, orthopnea GI: SEE HPI MUSCULOSKELETAL: Negative for joint pain or swelling, back pain, and muscle pain. SKIN: Negative for lesions, rash HEMATOLOGY Negative for prolonged bleeding, bruising easily, and swollen nodes. ENDOCRINE: Negative for cold or heat intolerance, polyuria, polydipsia and goiter. NEURO: negative for tremor, gait imbalance, syncope and seizures. The remainder of the review of systems is noncontributory.   Physical Exam: BP 138/80   Pulse  96   Temp 98.2 F (36.8 C)   Ht 5' 0.5 (1.537 m)   Wt 189 lb 12.8 oz (86.1 kg)   BMI 36.46 kg/m  GENERAL: The patient is AO x3, in no acute distress. HEENT: Head is normocephalic and atraumatic. EOMI are intact. Mouth is well hydrated and without lesions. NECK: Supple. No masses LUNGS: Clear to auscultation. No presence of rhonchi/wheezing/rales. Adequate chest expansion HEART: RRR, normal s1 and s2. ABDOMEN: Soft, nontender, no guarding, no peritoneal signs, and nondistended. BS +. No masses.   Imaging/Labs: as above     Latest Ref Rng & Units 07/29/2023    9:36 AM 02/11/2023    8:04 AM 10/30/2022    8:09 AM  CBC  WBC 3.8 -  10.8 Thousand/uL 8.2  6.4  6.6   Hemoglobin 11.7 - 15.5 g/dL 87.5  88.4  88.4   Hematocrit 35.0 - 45.0 % 39.6  35.4  35.9   Platelets 140 - 400 Thousand/uL 236  177  187    Lab Results  Component Value Date   IRON 80 03/16/2018   TIBC 402 03/16/2018   FERRITIN 56 03/16/2018    I personally reviewed and interpreted the available labs, imaging and endoscopic files.  Ultrasound 09/2023  IMPRESSION: 1. Cholelithiasis without sonographic evidence of acute cholecystitis. 2. Dilated common bile duct is abnormally dilated measuring up to 1.7 cm with mild intrahepatic biliary dilatation. Recommend further evaluation with MRCP. 3. Mildly increased hepatic parenchymal echogenicity, which is nonspecific but can be seen in the setting of hepatic steatosis. 4. Nonobstructing left renal stone.  Impression and Plan: MARGERIE [name] is a 73 y.o. female with history of colon carcinoma at 2 different times was eventually diagnosed with Lynch syndrome who also has history of renal cell carcinoma as well as lymphoma who who opted not to have her rectum taken out at her's second surgery (status post subtotal colectomy and has distal sigmoid colon and rectum in situ. ) presents for  management of Lynch syndrome  and Ultrasound with CBD dilation  #CBD  dilation/cholelithiasis # Fevers  Even though abdominal exam is benign today patient does report subjective fevers this morning, documented fever yesterday of Tmax 101.  Given cholelithiasis and CBD dilation I am concerned about choledocholithiasis and possible impending cholangitis  I discussed with patient option of getting blood work and MRCP as outpatient versus going to the ER  She would rather go to ER and proceed with lab work and stat MRI abdomen /MRCP which is reasonable given advanced age and subjective fevers  #Lynch syndrome  Patient still has rectum and part of the distal sigmoid colon in situ Last flexible sigmoidoscopy in 2021  Continue annual screening for rectal cancer- flexible sigmoidoscopy will schedule   For other cancers:  Stomach cancer. Upper endoscopy starting at age 70, with follow up no less than every 3 years.Will schedule now  Skin cancer. Annual exam beginning year after diagnosis.  Small bowel cancer. Capsule endoscopy starting at age 60, with follow up every 3 years.will discuss this in future   Urine testing. All patients diagnosed with Lynch syndrome should have a urinalysis every year beginning at age 30.   All questions were answered.      Egidio Lofgren Faizan Loreena Valeri, MD Gastroenterology and Hepatology [facility] Gastroenterology   This chart has been completed using [facility] Dictation software, and while attempts have been made to ensure accuracy , certain words and phrases may not be transcribed as intended

## 2023-10-01 NOTE — ED Notes (Signed)
 Pt still in MRI

## 2023-10-01 NOTE — ED Notes (Signed)
Pt returned to room from MRI.

## 2023-10-01 NOTE — H&P (Signed)
 History and Physical    Patient: Nicole [name] FMW:[id number] DOB: 09-Dec-1950 DOA: 10/01/2023 DOS: the patient was seen and examined on 10/01/2023 PCP: [name] [name], FNP  Patient coming from: Home  Chief Complaint:  Chief Complaint  Patient presents with   Abdominal Pain   HPI: RAYCHELLE HUDMAN is a 73 y.o. female with a history of Lynch syndrome, colon CA x2 s/p resection, renal cancer, skin CA, non-Hodkin's lymphoma, B12 deficiency who presented to the ED from GI office due to epigastric pain.   Initial work up on 7/8 with ultrasound revealed a dilated CBD (1.7cm) with intrahepatic dilatation and increased hepatic echogenicity. MRCP was recommended. When she presented to GI clinic today she reported intermittent fevers as well. She was sent to the ED where she has been hypertensive and afebrile (Tmax 42F). WBC normal, TBili 0.6, alkaline phosphatase 391, AST 79, ALT 176. MRCP showed a distended gallbladder with numerous stones, severe intra- and extra-hepatic ductal dilatation with CBD measuring 1.9cm with dependent gallstones. There appears to be tapering to the ampulla without a stone so ?stricture vs. occult mass. EDP spoke with  GI who confirmed ERCP service availability at [facility] and recommended admission there.   Review of Systems: As mentioned in the history of present illness. All other systems reviewed and are negative. Past Medical History:  Diagnosis Date   Allergy ?   Anemia    Arthritis    shorulder   B12 deficiency    Chronic diarrhea SECONDARY HX COLON CANCER   History of colon cancer [facility] SYNDROME ---  COLORECTAL CANCER S/P COLECTOMY X3  LAST ONE 2007   NO RECURRENCE   History of colon cancer 05/04/2013   History of kidney infection 08/2013   History of kidney stones    History of MRSA infection 08/2013   left leg   Hx of osteopenia    Hyperlipidemia ?   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   NHL  (non-Hodgkin's lymphoma) (HCC) 10/17/2010   MONITORED BY DR PERNELL   Personal history of renal cancer S/P LEFT PARTIAL NEPHRECTOMY 2007--  NO RECURRENCE   DUE TO KIDNEY CANCER   Right shoulder pain S/P ROTATOR CUFF REPAIR   Skin cancer    Past Surgical History:  Procedure Laterality Date   ABDOMINAL HYSTERECTOMY     APPENDECTOMY  1993   W/ LAVH   COLON SURGERY  2001,2002,2007   COLONOSCOPY W/ POLYPECTOMY     CYSTOSCOPY W/ RETROGRADES  07/14/2014   Procedure: CYSTOSCOPY WITH RETROGRADE PYELOGRAM;  Surgeon: Garnette Shack, MD;  Location: [facility];  Service: Urology;;   CYSTOSCOPY W/ URETERAL STENT REMOVAL Left 07/14/2014   Procedure: CYSTOSCOPY WITH STENT REMOVAL;  Surgeon: Garnette Shack, MD;  Location: [facility];  Service: Urology;  Laterality: Left;   CYSTOSCOPY WITH RETROGRADE PYELOGRAM, URETEROSCOPY AND STENT PLACEMENT Left 11/30/2020   Procedure: CYSTOSCOPY WITH RETROGRADE PYELOGRAM, URETEROSCOPY/STONE EXTRACTION AND STENT PLACEMENT;  Surgeon: Shack Garnette, MD;  Location: [facility];  Service: Urology;  Laterality: Left;   CYSTOSCOPY WITH RETROGRADE PYELOGRAM, URETEROSCOPY AND STENT PLACEMENT Left 06/24/2022   Procedure: CYSTOSCOPY WITH RETROGRADE PYELOGRAM, URETEROSCOPY AND STENT PLACEMENT;  Surgeon: Shack Garnette, MD;  Location: [facility];  Service: Urology;  Laterality: Left;   CYSTOSCOPY WITH STENT PLACEMENT  02/07/2012   Procedure: CYSTOSCOPY WITH STENT PLACEMENT;  Surgeon: Garnette Shack, MD;  Location: [facility];  Service: Urology;  Laterality: Left;   CYSTOSCOPY WITH STENT PLACEMENT Left 07/14/2014   Procedure: CYSTOSCOPY WITH STENT PLACEMENT;  Surgeon: Garnette Shack, MD;  Location: [facility];  Service: Urology;  Laterality: Left;   EYE SURGERY  ?   Removal of styes   FLEXIBLE SIGMOIDOSCOPY  08/29/2011   Procedure: FLEXIBLE SIGMOIDOSCOPY;  Surgeon:  Claudis RAYMOND Rivet, MD;  [facility];  Service: Endoscopy;  Laterality: N/A;  930   FLEXIBLE SIGMOIDOSCOPY N/A 09/28/2012   Procedure: FLEXIBLE SIGMOIDOSCOPY;  Surgeon: Claudis RAYMOND Rivet, MD;  [facility];  Service: Endoscopy;  Laterality: N/A;  730   FLEXIBLE SIGMOIDOSCOPY N/A 07/08/2014   Procedure: FLEXIBLE SIGMOIDOSCOPY;  Surgeon: Claudis RAYMOND Rivet, MD;  [facility];  Service: Endoscopy;  Laterality: N/A;  830 - moved to 10:20 - Ann to notify pt   FLEXIBLE SIGMOIDOSCOPY N/A 08/11/2015   Procedure: FLEXIBLE SIGMOIDOSCOPY;  Surgeon: Claudis RAYMOND Rivet, MD;  [facility];  Service: Endoscopy;  Laterality: N/A;  730   FLEXIBLE SIGMOIDOSCOPY N/A 08/13/2016   Procedure: FLEXIBLE SIGMOIDOSCOPY;  Surgeon: Rivet Claudis RAYMOND, MD;  [facility];  Service: Endoscopy;  Laterality: N/A;  730   FLEXIBLE SIGMOIDOSCOPY N/A 10/06/2019   Procedure: FLEXIBLE SIGMOIDOSCOPY;  Surgeon: Rivet Claudis RAYMOND, MD;  [facility];  Service: Endoscopy;  Laterality: N/A;  1245   HEMICOLECTOMY  2001   right   HEMICOLECTOMY  2002   left   HOLMIUM LASER APPLICATION Left 07/14/2014   Procedure: HOLMIUM LASER LITHOTRIPSY,;  Surgeon: Garnette Shack, MD;  Location: [facility];  Service: Urology;  Laterality: Left;   HOLMIUM LASER APPLICATION Left 11/30/2020   Procedure: HOLMIUM LASER APPLICATION;  Surgeon: Shack Garnette, MD;  Location: [facility];  Service: Urology;  Laterality: Left;   HOLMIUM LASER APPLICATION Left 06/24/2022   Procedure: HOLMIUM LASER APPLICATION;  Surgeon: Shack Garnette, MD;  Location: [facility];  Service: Urology;  Laterality: Left;   IR RADIOLOGIST EVAL & MGMT  04/29/2018   IR RADIOLOGIST EVAL & MGMT  10/07/2019   IR RADIOLOGIST EVAL & MGMT  10/11/2020   IR RADIOLOGIST EVAL & MGMT  11/08/2021   kidney resection  2007   left partial   LAPAROSCOPIC ASSISTED VAGINAL HYSTERECTOMY  1993    W/ BILATERAL SALPINGO-OOPHORECTOMY   LEFT FLANK EXPLORATION W/  PARTIAL LEFT NEPHRECTOMY/ LEFT HILAR LYMPH NODE BX  04-17-2005  DR DAHLSTEDT   RENAL CELL CARCINOMA   LITHOTRIPSY  11-22013   x2   NEPHROLITHOTOMY Left 06/09/2014   Procedure: NEPHROLITHOTOMY PERCUTANEOUS;  Surgeon: Garnette Shack, MD;  Location: [facility];  Service: Urology;  Laterality: Left;  with STENT   RADIOLOGY WITH ANESTHESIA Left 05/20/2018   Procedure: CT WITH ANESTHESIA LEFT RENAL CRYOABLATION AND BIOPSY;  Surgeon: Vanice Sharper, MD;  Location: [facility];  Service: Anesthesiology;  Laterality: Left;   RIGHT ROTATOR  CUFF REPAIR/   BICEP REPAIR  10-17-2011  DR SUPPLE   SUBTOTAL COLECTOMY  2007   URETEROSCOPY WITH HOLMIUM LASER LITHOTRIPSY Left 07/14/2014   Procedure: URETEROSCOPY  EXTRACTION OF STONES;  Surgeon: Garnette Shack, MD;  Location: [facility];  Service: Urology;  Laterality: Left;   Social History:  reports that she has never smoked. She has never used smokeless tobacco. She reports that she does not currently use alcohol. She reports that she does not use drugs.  Allergies  Allergen Reactions   Latex Rash   Morphine And Codeine Other (  See Comments)    Hallucinations    Tape Rash    Certain tapes     Family History  Problem Relation Age of Onset   Cancer Son    Early death Son    Cancer Mother    Cancer Maternal Grandfather     Prior to Admission medications   Medication Sig Start Date End Date Taking? Authorizing Provider  acetaminophen  (TYLENOL ) 500 MG tablet Take 1,000 mg by mouth every 8 (eight) hours as needed for fever.   Yes [provider]  BIOTIN  PO Take 1 tablet by mouth daily.   Yes [provider]  Cholecalciferol  (VITAMIN D3) 125 MCG (5000 UT) CAPS Take 5,000 Units by mouth daily.   Yes [provider]  cyanocobalamin  (VITAMIN B12) 1000 MCG/ML injection ADMINISTER 1 ML(1000 MCG) IN THE MUSCLE EVERY 21 DAYS 10/31/22  Yes Rogers Hai, MD  diphenoxylate -atropine  (LOMOTIL ) 2.5-0.025 MG tablet Take 1 tablet by mouth 4 (four) times daily as needed for diarrhea or loose stools. 11/05/21  Yes Rogers Hai, MD  furosemide  (LASIX ) 40 MG tablet Take 40 mg by mouth daily as needed for fluid.    Yes [provider]  ibuprofen  (ADVIL ,MOTRIN ) 800 MG tablet Take 800 mg by mouth 3 (three) times daily as needed for moderate pain. 08/01/16  Yes [provider]  magnesium oxide (MAG-OX) 400 (240 Mg) MG tablet Take 400 mg by mouth daily.   Yes [provider]  Potassium Citrate  15 MEQ (1620 MG) TBCR Take 1 tablet by mouth 3 (three) times daily. 08/01/23  Yes Matilda Senior, MD    Physical Exam: Vitals:   10/01/23 1615 10/01/23 1630 10/01/23 1637 10/01/23 1700  BP: 127/65 (!) 155/85  (!) 156/66  Pulse: 75 76  78  Resp:  16    Temp:   98.1 F (36.7 C)   TempSrc:   Oral   SpO2: 96% 99%  96%  Weight:      Height:      Gen: Exceedingly pleasant older female in no distress Pulm: Clear, nonlabored  CV: RRR, no MRG or edema GI: Soft, minimal epigastric tenderness, no RUQ tenderness, negative Murphy's  Neuro: Alert and oriented. No new focal deficits. Ext: Warm, no deformities. Skin: No rashes, lesions or ulcers on visualized skin   Data Reviewed: Alk phos 391, AST 79, ALT 176. CBC normal. Other elements of CMP including TBili wnl. UA: Many bacteria, >50 WBCs w/clumps, though 6-10 squamous cells.   MRCP:   - Distended gallbladder containing numerous gallstones. - Severe intra and extrahepatic biliary ductal dilatation, the common bile duct measuring up to 1.9 cm in caliber and containing multiple small gallstones dependently. The duct however appears to taper to the ampulla without calculus of the ampulla. Findings are highly suspicious for ampullary stricture or perhaps an occult mass. Notably, biliary ductal dilatation is new when compared to relatively recent prior CT dated 10/30/2022.  Consider ERCP for further assessment. - Enlarged retroperitoneal lymph nodes unchanged when compared to examination dated 10/30/2022, left retroperitoneal nodes measuring up to 1.7 x 1.2 cm. - Status post partial right hemicolectomy. - Unchanged extensive left renal cortical scarring.  Assessment and Plan: Choledocholithiasis, CBD dilatation, concern for early cholangitis:  - Continue zosyn  - NPO p MN - Admit to [facility] for [place] GI consultation and ERCP - Trend CMP  Lynch Syndrome: Continue outpatient follow up. Of note, she has tapering of CBD at the ampulla without definite stone and her son had pancreatic CA  diagnosed by ERCP.   Class II obesity: Body mass index is 36.46 kg/m.   Vitamin B12 deficiency: Gets IM supplementation as outpatient.   Advance Care Planning: Full code  Consults: GI  Family Communication: None at bedside  Severity of Illness: The appropriate patient status for this patient is INPATIENT. Inpatient status is judged to be reasonable and necessary in order to provide the required intensity of service to ensure the patient's safety. The patient's presenting symptoms, physical exam findings, and initial radiographic and laboratory data in the context of their chronic comorbidities is felt to place them at high risk for further clinical deterioration. Furthermore, it is not anticipated that the patient will be medically stable for discharge from the hospital within 2 midnights of admission.   * I certify that at the point of admission it is my clinical judgment that the patient will require inpatient hospital care spanning beyond 2 midnights from the point of admission due to high intensity of service, high risk for further deterioration and high frequency of surveillance required.*  Author: Bernardino KATHEE Come, MD 10/01/2023 6:22 PM  For on call review [email or web address].

## 2023-10-01 NOTE — ED Provider Triage Note (Signed)
 Emergency Medicine Provider Triage Evaluation Note  Nicole [name] , a 73 y.o. female  was evaluated in triage.  Pt complains of abdominal pain, abnormal labs.  Patient seen in GI clinic this morning with transaminitis and right upper quadrant pain and concern for biliary stone.  Sent for MRCP  Review of Systems  Positive: Abdominal pain Negative: Nausea, vomiting, chest pain, shortness of breath  Physical Exam  BP (!) 171/65 (BP Location: Right Arm)   Pulse 91   Temp 99 F (37.2 C) (Oral)   Resp (!) 22   Ht 5' 0.5 (1.537 m)   Wt 86.1 kg   SpO2 97%   BMI 36.46 kg/m  Gen:   Awake, no distress   Resp:  Normal effort  MSK:   Moves extremities without difficulty  Other:    Medical Decision Making  Medically screening exam initiated at 1:51 PM.  Appropriate orders placed.  DIMITRI DSOUZA was informed that the remainder of the evaluation will be completed by another provider, this initial triage assessment does not replace that evaluation, and the importance of remaining in the ED until their evaluation is complete.     Albertina Dixon, MD 10/01/23 1352

## 2023-10-02 DIAGNOSIS — K8050 Calculus of bile duct without cholangitis or cholecystitis without obstruction: Secondary | ICD-10-CM | POA: Diagnosis not present

## 2023-10-02 DIAGNOSIS — R7989 Other specified abnormal findings of blood chemistry: Secondary | ICD-10-CM

## 2023-10-02 DIAGNOSIS — R509 Fever, unspecified: Secondary | ICD-10-CM

## 2023-10-02 DIAGNOSIS — N390 Urinary tract infection, site not specified: Secondary | ICD-10-CM | POA: Diagnosis not present

## 2023-10-02 DIAGNOSIS — R748 Abnormal levels of other serum enzymes: Secondary | ICD-10-CM | POA: Diagnosis not present

## 2023-10-02 DIAGNOSIS — K831 Obstruction of bile duct: Secondary | ICD-10-CM | POA: Diagnosis not present

## 2023-10-02 LAB — CBC
HCT: 36.6 % (ref 36.0–46.0)
Hemoglobin: 11.6 g/dL — ABNORMAL LOW (ref 12.0–15.0)
MCH: 28.5 pg (ref 26.0–34.0)
MCHC: 31.7 g/dL (ref 30.0–36.0)
MCV: 89.9 fL (ref 80.0–100.0)
Platelets: 180 K/uL (ref 150–400)
RBC: 4.07 MIL/uL (ref 3.87–5.11)
RDW: 15.5 % (ref 11.5–15.5)
WBC: 7.6 K/uL (ref 4.0–10.5)
nRBC: 0 % (ref 0.0–0.2)

## 2023-10-02 LAB — COMPREHENSIVE METABOLIC PANEL WITH GFR
ALT: 116 U/L — ABNORMAL HIGH (ref 0–44)
AST: 45 U/L — ABNORMAL HIGH (ref 15–41)
Albumin: 3 g/dL — ABNORMAL LOW (ref 3.5–5.0)
Alkaline Phosphatase: 287 U/L — ABNORMAL HIGH (ref 38–126)
Anion gap: 9 (ref 5–15)
BUN: 16 mg/dL (ref 8–23)
CO2: 23 mmol/L (ref 22–32)
Calcium: 8.5 mg/dL — ABNORMAL LOW (ref 8.9–10.3)
Chloride: 108 mmol/L (ref 98–111)
Creatinine, Ser: 0.78 mg/dL (ref 0.44–1.00)
GFR, Estimated: >60 mL/min (ref >60)
Glucose, Bld: 112 mg/dL — ABNORMAL HIGH (ref 70–99)
Potassium: 3.9 mmol/L (ref 3.5–5.1)
Sodium: 140 mmol/L (ref 135–145)
Total Bilirubin: 0.8 mg/dL (ref 0.0–1.2)
Total Protein: 6.1 g/dL — ABNORMAL LOW (ref 6.5–8.1)

## 2023-10-02 LAB — PROTIME-INR
INR: 1.1 (ref 0.8–1.2)
Prothrombin Time: 14.6 s (ref 11.4–15.2)

## 2023-10-02 MED ORDER — ONDANSETRON HCL 4 MG/2ML IJ SOLN
4.0000 mg | Freq: Four times a day (QID) | INTRAMUSCULAR | Status: DC | PRN
  Administered 2023-10-03: 4 mg via INTRAVENOUS

## 2023-10-02 NOTE — Anesthesia Preprocedure Evaluation (Signed)
 Anesthesia Evaluation  Patient identified by MRN, date of birth, ID band Patient awake    Reviewed: Allergy & Precautions, NPO status , Patient's Chart, lab work & pertinent test results  History of Anesthesia Complications Negative for: history of anesthetic complications  Airway Mallampati: II  TM Distance: >3 FB Neck ROM: Full    Dental no notable dental hx.    Pulmonary neg pulmonary ROS   Pulmonary exam normal        Cardiovascular negative cardio ROS Normal cardiovascular exam     Neuro/Psych negative neurological ROS     GI/Hepatic Common bile duct stones, elevated LFTs   Endo/Other  negative endocrine ROS    Renal/GU      Musculoskeletal  (+) Arthritis ,    Abdominal   Peds  Hematology  (+) Blood dyscrasia (Hgb 11.6), anemia   Anesthesia Other Findings Lynch syndrome, hx of colon ca s/p colectomy, NHL, renal ca s/p left partial nephrectomy  Reproductive/Obstetrics                              Anesthesia Physical Anesthesia Plan  ASA: 3  Anesthesia Plan: General   Post-op Pain Management: Minimal or no pain anticipated   Induction: Intravenous  PONV Risk Score and Plan: 3 and Treatment may vary due to age or medical condition, Ondansetron  and Dexamethasone   Airway Management Planned: Oral ETT  Additional Equipment: None  Intra-op Plan:   Post-operative Plan: Extubation in OR  Informed Consent: I have reviewed the patients History and Physical, chart, labs and discussed the procedure including the risks, benefits and alternatives for the proposed anesthesia with the patient or authorized representative who has indicated his/her understanding and acceptance.     Dental advisory given  Plan Discussed with: CRNA  Anesthesia Plan Comments:          Anesthesia Quick Evaluation

## 2023-10-02 NOTE — Consult Note (Signed)
 Consultation  Referring Provider: ER MD/[place] Primary Care Physician:  [name] [name], FNP Primary Gastroenterologist:  Dr.Ahmed/[facility]  Reason for Consultation: Common bile duct stones  HPI: Nicole [name] is a 73 y.o. female with history of Lynch syndrome status post colon cancer on 2 separate occasions who is status post subtotal colectomy and has distal sigmoid and rectum in situ.  Also with history of previous renal cell CA and non-Hodgkin's lymphoma.  She has B12 deficiency. Patient had undergone recent outpatient ultrasound on 09/23/2023 due to complaints of intermittent episodes of epigastric pain and nausea which initially started in May 2025.  She says a couple of these episodes were associated with shaking chills and fever and she would feel poorly for a day. The ultrasound shows dilated common bile duct at 1.7 cm as well as increased hepatic echogenicity, cholelithiasis but no cholecystitis. She was actually being seen by GI yesterday and due to these symptoms, including an episode that occurred this past weekend with low-grade fever in the 99 range at home along with an episode of epigastric pain.  She says over the past couple of days she has continued to have intermittent epigastric pain, no vomiting and no temp over 100. She was advised to go to the emergency room where she underwent MRI/MRCP at [facility] yesterday which shows a distended gallbladder with multiple gallstones, severe intra and extrahepatic biliary ductal dilation common bile duct of 1.9 cm containing multiple small gallstones, the duct does taper to the ampulla without stones seen in the area of the ampulla, pancreas unremarkable.  She has enlarged retroperitoneal lymph nodes unchanged since August 2024, and is status post right hemicolectomy Labs yesterday show WBC 7.6/hemoglobin 11.6/hematocrit 36.6/platelets 180 Potassium 3.9/BUN 16/creatinine 0.78 T. bili 0.6/alk phos 391/AST 79/ALT  176 Lipase normal  Repeat LFTs today with T. bili 0.8/alk phos 287/AST 45/ALT 116  She has been started on IV Zosyn  Feels okay today with some mild epigastric pain, feels that she could eat.   No blood thinners, no NSAIDs Family history of pancreatic cancer in her mother and one of her sons at a young age, now deceased  Past Medical History:  Diagnosis Date   Allergy ?   Anemia    Arthritis    shorulder   B12 deficiency    Chronic diarrhea SECONDARY HX COLON CANCER   History of colon cancer [facility] SYNDROME ---  COLORECTAL CANCER S/P COLECTOMY X3  LAST ONE 2007   NO RECURRENCE   History of colon cancer 05/04/2013   History of kidney infection 08/2013   History of kidney stones    History of MRSA infection 08/2013   left leg   Hx of osteopenia    Hyperlipidemia ?   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   NHL (non-Hodgkin's lymphoma) (HCC) 10/17/2010   MONITORED BY DR PERNELL   Personal history of renal cancer S/P LEFT PARTIAL NEPHRECTOMY 2007--  NO RECURRENCE   DUE TO KIDNEY CANCER   Right shoulder pain S/P ROTATOR CUFF REPAIR   Skin cancer     Past Surgical History:  Procedure Laterality Date   ABDOMINAL HYSTERECTOMY     APPENDECTOMY  1993   W/ LAVH   COLON SURGERY  2001,2002,2007   COLONOSCOPY W/ POLYPECTOMY     CYSTOSCOPY W/ RETROGRADES  07/14/2014   Procedure: CYSTOSCOPY WITH RETROGRADE PYELOGRAM;  Surgeon: Garnette Shack, MD;  Location: [facility];  Service:  Urology;;   CYSTOSCOPY W/ URETERAL STENT REMOVAL Left 07/14/2014   Procedure: CYSTOSCOPY WITH STENT REMOVAL;  Surgeon: Garnette Shack, MD;  Location: [facility];  Service: Urology;  Laterality: Left;   CYSTOSCOPY WITH RETROGRADE PYELOGRAM, URETEROSCOPY AND STENT PLACEMENT Left 11/30/2020   Procedure: CYSTOSCOPY WITH RETROGRADE PYELOGRAM, URETEROSCOPY/STONE EXTRACTION AND STENT PLACEMENT;  Surgeon: Shack Garnette, MD;   Location: [facility];  Service: Urology;  Laterality: Left;   CYSTOSCOPY WITH RETROGRADE PYELOGRAM, URETEROSCOPY AND STENT PLACEMENT Left 06/24/2022   Procedure: CYSTOSCOPY WITH RETROGRADE PYELOGRAM, URETEROSCOPY AND STENT PLACEMENT;  Surgeon: Shack Garnette, MD;  Location: [facility];  Service: Urology;  Laterality: Left;   CYSTOSCOPY WITH STENT PLACEMENT  02/07/2012   Procedure: CYSTOSCOPY WITH STENT PLACEMENT;  Surgeon: Garnette Shack, MD;  Location: [facility];  Service: Urology;  Laterality: Left;   CYSTOSCOPY WITH STENT PLACEMENT Left 07/14/2014   Procedure: CYSTOSCOPY WITH STENT PLACEMENT;  Surgeon: Garnette Shack, MD;  Location: [facility];  Service: Urology;  Laterality: Left;   EYE SURGERY  ?   Removal of styes   FLEXIBLE SIGMOIDOSCOPY  08/29/2011   Procedure: FLEXIBLE SIGMOIDOSCOPY;  Surgeon: Claudis RAYMOND Rivet, MD;  [facility];  Service: Endoscopy;  Laterality: N/A;  930   FLEXIBLE SIGMOIDOSCOPY N/A 09/28/2012   Procedure: FLEXIBLE SIGMOIDOSCOPY;  Surgeon: Claudis RAYMOND Rivet, MD;  [facility];  Service: Endoscopy;  Laterality: N/A;  730   FLEXIBLE SIGMOIDOSCOPY N/A 07/08/2014   Procedure: FLEXIBLE SIGMOIDOSCOPY;  Surgeon: Claudis RAYMOND Rivet, MD;  [facility];  Service: Endoscopy;  Laterality: N/A;  830 - moved to 10:20 - Ann to notify pt   FLEXIBLE SIGMOIDOSCOPY N/A 08/11/2015   Procedure: FLEXIBLE SIGMOIDOSCOPY;  Surgeon: Claudis RAYMOND Rivet, MD;  [facility];  Service: Endoscopy;  Laterality: N/A;  730   FLEXIBLE SIGMOIDOSCOPY N/A 08/13/2016   Procedure: FLEXIBLE SIGMOIDOSCOPY;  Surgeon: Rivet Claudis RAYMOND, MD;  [facility];  Service: Endoscopy;  Laterality: N/A;  730   FLEXIBLE SIGMOIDOSCOPY N/A 10/06/2019   Procedure: FLEXIBLE SIGMOIDOSCOPY;  Surgeon: Rivet Claudis RAYMOND, MD;  [facility];  Service: Endoscopy;  Laterality: N/A;  1245   HEMICOLECTOMY   2001   right   HEMICOLECTOMY  2002   left   HOLMIUM LASER APPLICATION Left 07/14/2014   Procedure: HOLMIUM LASER LITHOTRIPSY,;  Surgeon: Garnette Shack, MD;  Location: [facility];  Service: Urology;  Laterality: Left;   HOLMIUM LASER APPLICATION Left 11/30/2020   Procedure: HOLMIUM LASER APPLICATION;  Surgeon: Shack Garnette, MD;  Location: [facility];  Service: Urology;  Laterality: Left;   HOLMIUM LASER APPLICATION Left 06/24/2022   Procedure: HOLMIUM LASER APPLICATION;  Surgeon: Shack Garnette, MD;  Location[facility];  Service: Urology;  Laterality: Left;   IR RADIOLOGIST EVAL & MGMT  04/29/2018   IR RADIOLOGIST EVAL & MGMT  10/07/2019   IR RADIOLOGIST EVAL & MGMT  10/11/2020   IR RADIOLOGIST EVAL & MGMT  11/08/2021   kidney resection  2007   left partial   LAPAROSCOPIC ASSISTED VAGINAL HYSTERECTOMY  1993   W/ BILATERAL SALPINGO-OOPHORECTOMY   LEFT FLANK EXPLORATION W/  PARTIAL LEFT NEPHRECTOMY/ LEFT HILAR LYMPH NODE BX  04-17-2005  DR DAHLSTEDT   RENAL CELL CARCINOMA   LITHOTRIPSY  11-22013   x2   NEPHROLITHOTOMY Left 06/09/2014   Procedure: NEPHROLITHOTOMY PERCUTANEOUS;  Surgeon: Garnette Shack, MD;  Location: [facility];  Service:  Urology;  Laterality: Left;  with STENT   RADIOLOGY WITH ANESTHESIA Left 05/20/2018   Procedure: CT WITH ANESTHESIA LEFT RENAL CRYOABLATION AND BIOPSY;  Surgeon: Vanice Sharper, MD;  Location: [facility];  Service: Anesthesiology;  Laterality: Left;   RIGHT ROTATOR  CUFF REPAIR/   BICEP REPAIR  10-17-2011  DR SUPPLE   SUBTOTAL COLECTOMY  2007   URETEROSCOPY WITH HOLMIUM LASER LITHOTRIPSY Left 07/14/2014   Procedure: URETEROSCOPY  EXTRACTION OF STONES;  Surgeon: Garnette Shack, MD;  Location: [facility];  Service: Urology;  Laterality: Left;    Prior to Admission medications   Medication Sig Start Date End Date Taking? Authorizing Provider  acetaminophen  (TYLENOL ) 500 MG  tablet Take 1,000 mg by mouth every 8 (eight) hours as needed for fever.   Yes [provider]  BIOTIN  PO Take 1 tablet by mouth daily.   Yes [provider]  Cholecalciferol  (VITAMIN D3) 125 MCG (5000 UT) CAPS Take 5,000 Units by mouth daily.   Yes [provider]  cyanocobalamin  (VITAMIN B12) 1000 MCG/ML injection ADMINISTER 1 ML(1000 MCG) IN THE MUSCLE EVERY 21 DAYS 10/31/22  Yes Rogers Hai, MD  diphenoxylate -atropine  (LOMOTIL ) 2.5-0.025 MG tablet Take 1 tablet by mouth 4 (four) times daily as needed for diarrhea or loose stools. 11/05/21  Yes Rogers Hai, MD  furosemide  (LASIX ) 40 MG tablet Take 40 mg by mouth daily as needed for fluid.    Yes [provider]  ibuprofen  (ADVIL ,MOTRIN ) 800 MG tablet Take 800 mg by mouth 3 (three) times daily as needed for moderate pain. 08/01/16  Yes [provider]  magnesium oxide (MAG-OX) 400 (240 Mg) MG tablet Take 400 mg by mouth daily.   Yes [provider]  Potassium Citrate  15 MEQ (1620 MG) TBCR Take 1 tablet by mouth 3 (three) times daily. 08/01/23  Yes Shack Garnette, MD    Current Facility-Administered Medications  Medication Dose Route Frequency Provider Last Rate Last Admin   acetaminophen  (TYLENOL ) tablet 1,000 mg  1,000 mg Oral Q8H PRN Bryn Bernardino NOVAK, MD       diphenoxylate -atropine  (LOMOTIL ) 2.5-0.025 MG per tablet 1 tablet  1 tablet Oral QID PRN Bryn Bernardino NOVAK, MD       ondansetron  (ZOFRAN ) injection 4 mg  4 mg Intravenous Q6H PRN Rai, Ripudeep K, MD       piperacillin -tazobactam (ZOSYN ) IVPB 3.375 g  3.375 g Intravenous Q8H Merrill, Kristin A, RPH   Stopped at 10/02/23 1005    Allergies as of 10/01/2023 - Review Complete 10/01/2023  Allergen Reaction Noted   Latex Rash 10/19/2010   Morphine and codeine Other (See Comments) 10/19/2010   Tape Rash 06/01/2014    Family History  Problem Relation Age of Onset   Cancer Son    Early death Son    Cancer Mother    Cancer  Maternal Grandfather     Social History   Socioeconomic History   Marital status: Divorced    Spouse name: Not on file   Number of children: Not on file   Years of education: Not on file   Highest education level: 12th grade  Occupational History   Not on file  Tobacco Use   Smoking status: Never   Smokeless tobacco: Never  Vaping Use   Vaping status: Never Used  Substance and Sexual Activity   Alcohol use: Not Currently    Comment: 2 glasses of wine per year - maybe   Drug use: Never   Sexual activity: Not  Currently  Other Topics Concern   Not on file  Social History Narrative   Not on file   Social Drivers of Health   Financial Resource Strain: Low Risk  (07/30/2023)   Overall Financial Resource Strain (CARDIA)    Difficulty of Paying Living Expenses: Not very hard  Food Insecurity: No Food Insecurity (10/02/2023)   Hunger Vital Sign    Worried About Running Out of Food in the Last Year: Never true    Ran Out of Food in the Last Year: Never true  Transportation Needs: No Transportation Needs (10/02/2023)   PRAPARE - Administrator, Civil Service (Medical): No    Lack of Transportation (Non-Medical): No  Physical Activity: Insufficiently Active (07/27/2023)   Exercise Vital Sign    Days of Exercise per Week: 3 days    Minutes of Exercise per Session: 30 min  Stress: No Stress Concern Present (07/30/2023)   [employer] of Occupational Health - Occupational Stress Questionnaire    Feeling of Stress : Not at all  Social Connections: Moderately Integrated (10/02/2023)   Social Connection and Isolation Panel    Frequency of Communication with Friends and Family: More than three times a week    Frequency of Social Gatherings with Friends and Family: Twice a week    Attends Religious Services: More than 4 times per year    Active Member of [employer] or Organizations: Yes    Attends Engineer, structural: More than 4 times per year    Marital Status:  Divorced  Intimate Partner Violence: Not At Risk (10/02/2023)   Humiliation, Afraid, Rape, and Kick questionnaire    Fear of Current or Ex-Partner: No    Emotionally Abused: No    Physically Abused: No    Sexually Abused: No    Review of Systems: Pertinent positive and negative review of systems were noted in the above HPI section.  All other review of systems was otherwise negative.   Physical Exam: Vital signs in last 24 hours: Temp:  [98.1 F (36.7 C)-99.2 F (37.3 C)] 98.7 F (37.1 C) (07/17 0823) Pulse Rate:  [71-91] 80 (07/17 0823) Resp:  [10-22] 19 (07/17 0823) BP: (127-171)/(62-85) 169/73 (07/17 0823) SpO2:  [94 %-100 %] 100 % (07/17 0823) Weight:  [86.1 kg] 86.1 kg (07/16 1209) Last BM Date : 10/01/23 General:   Alert,  Well-developed, well-nourished, elderly white female pleasant and cooperative in NAD Head:  Normocephalic and atraumatic. Eyes:  Sclera clear, no icterus.   Conjunctiva pink. Ears:  Normal auditory acuity. Nose:  No deformity, discharge,  or lesions. Mouth:  No deformity or lesions.   Neck:  Supple; no masses or thyromegaly. Lungs:  Clear throughout to auscultation.   No wheezes, crackles, or rhonchi.  Heart:  Regular rate and rhythm; no murmurs, clicks, rubs,  or gallops. Abdomen:  Soft, minimally tender in the epigastrium, no guarding or rebound, no palpable mass or hepatosplenomegaly, incisional scars present Rectal: Not done Msk:  Symmetrical without gross deformities. . Pulses:  Normal pulses noted. Extremities:  Without clubbing or edema. Neurologic:  Alert and  oriented x4;  grossly normal neurologically. Skin:  Intact without significant lesions or rashes.. Psych:  Alert and cooperative. Normal mood and affect.  Intake/Output from previous day: 07/16 0701 - 07/17 0700 In: 1042.4 [IV Piggyback:1042.4] Out: -  Intake/Output this shift: No intake/output data recorded.  Lab Results: Recent Labs    10/01/23 1251 10/02/23 0313  WBC 8.5  7.6  HGB 13.5  11.6*  HCT 42.2 36.6  PLT 218 180   BMET Recent Labs    10/01/23 1251 10/02/23 0313  NA 138 140  K 4.3 3.9  CL 102 108  CO2 23 23  GLUCOSE 96 112*  BUN 22 16  CREATININE 0.92 0.78  CALCIUM 9.5 8.5*   LFT Recent Labs    10/02/23 0313  PROT 6.1*  ALBUMIN 3.0*  AST 45*  ALT 116*  ALKPHOS 287*  BILITOT 0.8   PT/INR No results for input(s): LABPROT, INR in the last 72 hours. Hepatitis Panel No results for input(s): HEPBSAG, HCVAB, HEPAIGM, HEPBIGM in the last 72 hours.   IMPRESSION:  #80 73 year old white female with 37-month history of intermittent episodes of epigastric pain and nausea, patient had an episode this past weekend associated with low-grade temp in the 99-100 range.  She says she had a couple of episodes in May that were associated with brief fevers and chills. MRI/MRCP yesterday shows multiple gallstones, no cholecystitis, dilated common bile duct containing multiple gallstones, and distal common bile duct tapered to the ampulla without stone seen in the distal duct. She has not had fever here, no leukocytosis so less concern for cholangitis at present but does have elevated LFTs as above with normal T. bili at 0.8  Rule out symptoms secondary to choledocholithiasis, rule out possible ampullary stricture/lesion  #2 history of Lynch syndrome status post colon cancer x 2 status post subtotal colectomy with distal sigmoid and rectum in situ #3 history of non-Hodgkin's lymphoma #4 history of renal cell CA #5 B12 deficiency #6 hepatic steatosis   PLAN: Okay for regular diet today, n.p.o. past midnight Continue IV Zosyn  Continue to trend labs Added INR Patient has been scheduled for ERCP with Dr. Avram for tomorrow morning at 9:30 AM.  Procedure was discussed in detail with the patient today at bedside, we reviewed biliary images, discussed ERCP in detail including indications risks and benefits, discussed potential anesthesia  complications, bleeding, perforation, procedure failure, pancreatitis, and patient is agreeable to proceed GI will follow with you   Daylene Vandenbosch EsterwoodPA-C  10/02/2023, 11:02 AM

## 2023-10-02 NOTE — Plan of Care (Signed)

## 2023-10-02 NOTE — Progress Notes (Addendum)
 [facility]                                                                              Nicole [name], is a 73 y.o. female, DOB - November 22, 1950, FMW:[id number] Admit date - 10/01/2023    Outpatient Primary MD for the patient is [name], [name], FNP  LOS - 1  days  Chief Complaint  Patient presents with   Abdominal Pain       Brief summary   Patient is a 73 year old female with history of Lynch syndrome, colon CA x2 s/p resection, renal cancer, skin CA, non-Hodkin's lymphoma, B12 deficiency who presented to the ED from GI office due to epigastric pain.  Initial work up on 7/8 with ultrasound revealed a dilated CBD (1.7cm) with intrahepatic dilatation and increased hepatic echogenicity. MRCP was recommended. When she presented to GI clinic on 7/16, she reported intermittent fevers as well.  She was sent to the ED where she has been hypertensive and afebrile (Tmax 52F). WBC normal, TBili 0.6, alkaline phosphatase 391, AST 79, ALT 176. MRCP showed a distended gallbladder with numerous stones, severe intra- and extra-hepatic ductal dilatation with CBD measuring 1.9cm with dependent gallstones. There appears to be tapering to the ampulla without a stone so ?stricture vs. occult mass.  Patient was transferred to [facility] for ERCP.   Assessment & Plan    Principal Problem:   Common bile duct obstruction, choledocholithiasis, early cholangitis -Continue IV Zosyn , currently n.p.o. - Consulted GI, awaiting evaluation for ERCP - LFTs improving   Active Problems: Urinary tract infection - Follow urine culture and sensitivities - Continue IV Zosyn , will adjust antibiotics with sensitivities    Lynch syndrome, history of colon CA x2 -Continue outpatient follow-up - Will await ERCP evaluation, has tapering of CBD at the ampulla without definite stone.  Son had pancreatic CA diagnosed by ERCP - Follows outpatient with Dr. Dorsey (oncology) and GI    Renal cell carcinoma [facility]),   - Has history of left partial nephrectomy in 03/2005, pathology revealed stage T1a clear-cell. She had recurrent renal cell CA in 2020 and was treated with cryoablation on 05/20/2018  - has been following urology outpatient    NHL (non-Hodgkin's lymphoma) [facility]) -Follows Dr. Rogers, per last note, retroperitoneal lymphadenopathy stable, no B symptoms.    B12 deficiency - Continue B12 injections every 3 months  Obesity class II Estimated body mass index is 36.46 kg/m as calculated from the following:   Height as of this encounter: 5' 0.5 (1.537 m).   Weight as of this encounter: 86.1 kg.  Code Status: Full code DVT Prophylaxis:  enoxaparin  (LOVENOX ) injection 40 mg Start: 10/02/23 0600   Level of Care: Level of care: Med-Surg Family Communication: Updated patient' Disposition Plan:      Remains inpatient appropriate:      Procedures:    Consultants:   Gastroenterology  Antimicrobials:   Anti-infectives (From admission, onward)    Start     Dose/Rate Route Frequency Ordered Stop   10/01/23 2200  piperacillin -tazobactam (ZOSYN ) IVPB 3.375 g        3.375 g 12.5 mL/hr over 240 Minutes Intravenous Every  8 hours 10/01/23 1844     10/01/23 1630  piperacillin -tazobactam (ZOSYN ) IVPB 3.375 g        3.375 g 100 mL/hr over 30 Minutes Intravenous  Once 10/01/23 1617 10/01/23 1701          Medications  enoxaparin  (LOVENOX ) injection  40 mg Subcutaneous Q24H      Subjective:   Tierria Watson was seen and examined today.  Awaiting ERCP.  Abdominal pain improving.  Denies any nausea vomiting, chest pain, shortness of breath.  Low-grade temp of 99.2 F.  No acute events overnight.    Objective:   Vitals:   10/01/23 2253 10/01/23 2257 10/02/23 0606 10/02/23 0823  BP: 133/62 133/62 (!) 145/80 (!) 169/73  Pulse: 78 71 84 80  Resp: 16 18 20 19   Temp: 98.8 F (37.1 C)  99.2 F (37.3 C) 98.7 F (37.1 C)  TempSrc: Oral  Oral   SpO2:  96% 98% 100%  Weight:      Height:         Intake/Output Summary (Last 24 hours) at 10/02/2023 1016 Last data filed at 10/01/2023 1919 Gross per 24 hour  Intake 1042.37 ml  Output --  Net 1042.37 ml     Wt Readings from Last 3 Encounters:  10/01/23 86.1 kg  10/01/23 86.1 kg  07/30/23 87.1 kg     Exam General: Alert and oriented x 3, NAD Cardiovascular: S1 S2 auscultated,  RRR Respiratory: Clear to auscultation bilaterally, no wheezing, rales or rhonchi Gastrointestinal: Soft, minimal epigastric TTP, ND, NBS Ext: no pedal edema bilaterally Neuro: No new deficits Psych: Normal affect     Data Reviewed:  I have personally reviewed following labs    CBC Lab Results  Component Value Date   WBC 7.6 10/02/2023   RBC 4.07 10/02/2023   HGB 11.6 (L) 10/02/2023   HCT 36.6 10/02/2023   MCV 89.9 10/02/2023   MCH 28.5 10/02/2023   PLT 180 10/02/2023   MCHC 31.7 10/02/2023   RDW 15.5 10/02/2023   LYMPHSABS 0.9 10/01/2023   MONOABS 1.1 (H) 10/01/2023   EOSABS 0.1 10/01/2023   BASOSABS 0.1 10/01/2023     Last metabolic panel Lab Results  Component Value Date   NA 140 10/02/2023   K 3.9 10/02/2023   CL 108 10/02/2023   CO2 23 10/02/2023   BUN 16 10/02/2023   CREATININE 0.78 10/02/2023   GLUCOSE 112 (H) 10/02/2023   GFRNONAA >60 10/02/2023   GFRAA >60 09/16/2019   CALCIUM 8.5 (L) 10/02/2023   PROT 6.1 (L) 10/02/2023   ALBUMIN 3.0 (L) 10/02/2023   BILITOT 0.8 10/02/2023   ALKPHOS 287 (H) 10/02/2023   AST 45 (H) 10/02/2023   ALT 116 (H) 10/02/2023   ANIONGAP 9 10/02/2023    CBG (last 3)  No results for input(s): GLUCAP in the last 72 hours.    Coagulation Profile: No results for input(s): INR, PROTIME in the last 168 hours.   Radiology Studies: I have personally reviewed the imaging studies  MR ABDOMEN MRCP W WO CONTAST Result Date: 10/01/2023 CLINICAL DATA:  Cholelithiasis history of ascending colon cancer EXAM: MRI ABDOMEN WITHOUT AND WITH CONTRAST (INCLUDING MRCP) TECHNIQUE:  Multiplanar multisequence MR imaging of the abdomen was performed both before and after the administration of intravenous contrast. Heavily T2-weighted images of the biliary and pancreatic ducts were obtained, and three-dimensional MRCP images were rendered by post processing. CONTRAST:  9mL GADAVIST  GADOBUTROL  1 MMOL/ML IV SOLN COMPARISON:  Right upper quadrant ultrasound, 09/23/2023  CT abdomen pelvis, 10/30/2022 FINDINGS: Lower chest: No acute abnormality. Hepatobiliary: No solid liver abnormality is seen. Distended gallbladder containing numerous gallstones. Severe intra and extrahepatic biliary ductal dilatation, the common bile duct measuring up to 1.9 cm in caliber and containing multiple small gallstones dependently (series 6, image 21). The duct however appears to taper to the ampulla without calculus of the ampulla (series 5, image 15). Pancreas: Unremarkable. No pancreatic ductal dilatation or surrounding inflammatory changes. Spleen: Normal in size without significant abnormality. Adrenals/Urinary Tract: Adrenal glands are unremarkable. Unchanged extensive left renal cortical scarring. Right kidney is normal, without obvious renal calculi, solid lesion, or hydronephrosis. Stomach/Bowel: Stomach is within normal limits. No evidence of bowel wall thickening, distention, or inflammatory changes. Status post partial right hemicolectomy. Vascular/Lymphatic: Aortic atherosclerosis. Enlarged retroperitoneal lymph nodes unchanged when compared to examination dated 10/30/2022, left retroperitoneal nodes measuring up 2 1.7 x 1.2 cm (series 17, image 51). Other: No abdominal wall hernia or abnormality. No ascites. Musculoskeletal: No acute or significant osseous findings. IMPRESSION: 1. Distended gallbladder containing numerous gallstones. 2. Severe intra and extrahepatic biliary ductal dilatation, the common bile duct measuring up to 1.9 cm in caliber and containing multiple small gallstones dependently. The duct  however appears to taper to the ampulla without calculus of the ampulla. Findings are highly suspicious for ampullary stricture or perhaps an occult mass. Notably, biliary ductal dilatation is new when compared to relatively recent prior CT dated 10/30/2022. Consider ERCP for further assessment. 3. Enlarged retroperitoneal lymph nodes unchanged when compared to examination dated 10/30/2022, left retroperitoneal nodes measuring up to 1.7 x 1.2 cm. 4. Status post partial right hemicolectomy. 5. Unchanged extensive left renal cortical scarring. Electronically Signed   By: Marolyn JONETTA Jaksch M.D.   On: 10/01/2023 16:01   MR 3D Recon At Scanner Result Date: 10/01/2023 CLINICAL DATA:  Cholelithiasis history of ascending colon cancer EXAM: MRI ABDOMEN WITHOUT AND WITH CONTRAST (INCLUDING MRCP) TECHNIQUE: Multiplanar multisequence MR imaging of the abdomen was performed both before and after the administration of intravenous contrast. Heavily T2-weighted images of the biliary and pancreatic ducts were obtained, and three-dimensional MRCP images were rendered by post processing. CONTRAST:  9mL GADAVIST  GADOBUTROL  1 MMOL/ML IV SOLN COMPARISON:  Right upper quadrant ultrasound, 09/23/2023 CT abdomen pelvis, 10/30/2022 FINDINGS: Lower chest: No acute abnormality. Hepatobiliary: No solid liver abnormality is seen. Distended gallbladder containing numerous gallstones. Severe intra and extrahepatic biliary ductal dilatation, the common bile duct measuring up to 1.9 cm in caliber and containing multiple small gallstones dependently (series 6, image 21). The duct however appears to taper to the ampulla without calculus of the ampulla (series 5, image 15). Pancreas: Unremarkable. No pancreatic ductal dilatation or surrounding inflammatory changes. Spleen: Normal in size without significant abnormality. Adrenals/Urinary Tract: Adrenal glands are unremarkable. Unchanged extensive left renal cortical scarring. Right kidney is normal,  without obvious renal calculi, solid lesion, or hydronephrosis. Stomach/Bowel: Stomach is within normal limits. No evidence of bowel wall thickening, distention, or inflammatory changes. Status post partial right hemicolectomy. Vascular/Lymphatic: Aortic atherosclerosis. Enlarged retroperitoneal lymph nodes unchanged when compared to examination dated 10/30/2022, left retroperitoneal nodes measuring up 2 1.7 x 1.2 cm (series 17, image 51). Other: No abdominal wall hernia or abnormality. No ascites. Musculoskeletal: No acute or significant osseous findings. IMPRESSION: 1. Distended gallbladder containing numerous gallstones. 2. Severe intra and extrahepatic biliary ductal dilatation, the common bile duct measuring up to 1.9 cm in caliber and containing multiple small gallstones dependently. The duct however appears to taper to the  ampulla without calculus of the ampulla. Findings are highly suspicious for ampullary stricture or perhaps an occult mass. Notably, biliary ductal dilatation is new when compared to relatively recent prior CT dated 10/30/2022. Consider ERCP for further assessment. 3. Enlarged retroperitoneal lymph nodes unchanged when compared to examination dated 10/30/2022, left retroperitoneal nodes measuring up to 1.7 x 1.2 cm. 4. Status post partial right hemicolectomy. 5. Unchanged extensive left renal cortical scarring. Electronically Signed   By: Marolyn JONETTA Jaksch M.D.   On: 10/01/2023 16:01       Kyla Duffy M.D. [facility] 10/02/2023, 10:16 AM  Available via Epic secure chat 7am-7pm After 7 pm, please refer to night coverage provider listed on amion.

## 2023-10-03 ENCOUNTER — Inpatient Hospital Stay (HOSPITAL_COMMUNITY)

## 2023-10-03 ENCOUNTER — Encounter (HOSPITAL_COMMUNITY): Payer: Self-pay | Admitting: Anesthesiology

## 2023-10-03 ENCOUNTER — Inpatient Hospital Stay (HOSPITAL_COMMUNITY): Payer: Self-pay | Admitting: Anesthesiology

## 2023-10-03 ENCOUNTER — Encounter (HOSPITAL_COMMUNITY): Payer: Self-pay | Admitting: Family Medicine

## 2023-10-03 ENCOUNTER — Encounter (HOSPITAL_COMMUNITY)
Admission: EM | Disposition: A | Discharge status: Home / Self Care | Payer: Self-pay | Source: Ambulatory Visit | Attending: Internal Medicine

## 2023-10-03 DIAGNOSIS — K8050 Calculus of bile duct without cholangitis or cholecystitis without obstruction: Secondary | ICD-10-CM | POA: Diagnosis not present

## 2023-10-03 DIAGNOSIS — E782 Mixed hyperlipidemia: Secondary | ICD-10-CM | POA: Diagnosis not present

## 2023-10-03 DIAGNOSIS — Z6836 Body mass index (BMI) 36.0-36.9, adult: Secondary | ICD-10-CM

## 2023-10-03 DIAGNOSIS — K269 Duodenal ulcer, unspecified as acute or chronic, without hemorrhage or perforation: Secondary | ICD-10-CM

## 2023-10-03 DIAGNOSIS — R509 Fever, unspecified: Secondary | ICD-10-CM | POA: Diagnosis not present

## 2023-10-03 DIAGNOSIS — R748 Abnormal levels of other serum enzymes: Secondary | ICD-10-CM | POA: Diagnosis not present

## 2023-10-03 DIAGNOSIS — N390 Urinary tract infection, site not specified: Secondary | ICD-10-CM | POA: Diagnosis not present

## 2023-10-03 HISTORY — PX: ESOPHAGOGASTRODUODENOSCOPY: SHX5428

## 2023-10-03 LAB — CBC
HCT: 35.7 % — ABNORMAL LOW (ref 36.0–46.0)
Hemoglobin: 11.4 g/dL — ABNORMAL LOW (ref 12.0–15.0)
MCH: 28.5 pg (ref 26.0–34.0)
MCHC: 31.9 g/dL (ref 30.0–36.0)
MCV: 89.3 fL (ref 80.0–100.0)
Platelets: 180 K/uL (ref 150–400)
RBC: 4 MIL/uL (ref 3.87–5.11)
RDW: 15 % (ref 11.5–15.5)
WBC: 6.9 K/uL (ref 4.0–10.5)
nRBC: 0 % (ref 0.0–0.2)

## 2023-10-03 LAB — COMPREHENSIVE METABOLIC PANEL WITH GFR
ALT: 87 U/L — ABNORMAL HIGH (ref 0–44)
AST: 41 U/L (ref 15–41)
Albumin: 2.7 g/dL — ABNORMAL LOW (ref 3.5–5.0)
Alkaline Phosphatase: 258 U/L — ABNORMAL HIGH (ref 38–126)
Anion gap: 9 (ref 5–15)
BUN: 21 mg/dL (ref 8–23)
CO2: 23 mmol/L (ref 22–32)
Calcium: 8.6 mg/dL — ABNORMAL LOW (ref 8.9–10.3)
Chloride: 107 mmol/L (ref 98–111)
Creatinine, Ser: 0.94 mg/dL (ref 0.44–1.00)
GFR, Estimated: >60 mL/min (ref >60)
Glucose, Bld: 125 mg/dL — ABNORMAL HIGH (ref 70–99)
Potassium: 4.2 mmol/L (ref 3.5–5.1)
Sodium: 139 mmol/L (ref 135–145)
Total Bilirubin: 0.7 mg/dL (ref 0.0–1.2)
Total Protein: 5.8 g/dL — ABNORMAL LOW (ref 6.5–8.1)

## 2023-10-03 SURGERY — EGD (ESOPHAGOGASTRODUODENOSCOPY)
Anesthesia: General

## 2023-10-03 MED ORDER — PHENYLEPHRINE 80 MCG/ML (10ML) SYRINGE FOR IV PUSH (FOR BLOOD PRESSURE SUPPORT)
PREFILLED_SYRINGE | INTRAVENOUS | Status: DC | PRN
  Administered 2023-10-03: 160 ug via INTRAVENOUS
  Administered 2023-10-03: 80 ug via INTRAVENOUS

## 2023-10-03 MED ORDER — PROPOFOL 10 MG/ML IV BOLUS
INTRAVENOUS | Status: DC | PRN
  Administered 2023-10-03: 120 mg via INTRAVENOUS

## 2023-10-03 MED ORDER — DICLOFENAC SUPPOSITORY 100 MG: 100.0000 mg | Freq: Once | RECTAL | Status: DC

## 2023-10-03 MED ORDER — SUGAMMADEX SODIUM 200 MG/2ML IV SOLN
INTRAVENOUS | Status: DC | PRN
  Administered 2023-10-03: 200 mg via INTRAVENOUS

## 2023-10-03 MED ORDER — ROCURONIUM BROMIDE 10 MG/ML (PF) SYRINGE
PREFILLED_SYRINGE | INTRAVENOUS | Status: DC | PRN
  Administered 2023-10-03: 50 mg via INTRAVENOUS

## 2023-10-03 MED ORDER — LACTATED RINGERS IV SOLN: INTRAVENOUS | Status: DC | PRN

## 2023-10-03 MED ORDER — PANTOPRAZOLE SODIUM 40 MG PO TBEC
40.0000 mg | DELAYED_RELEASE_TABLET | Freq: Two times a day (BID) | ORAL | Status: DC
  Administered 2023-10-03 – 2023-10-08 (×10): 40 mg via ORAL
  Filled 2023-10-03 (×10): qty 1

## 2023-10-03 MED ORDER — GLUCAGON HCL RDNA (DIAGNOSTIC) 1 MG IJ SOLR
INTRAMUSCULAR | Status: AC
  Filled 2023-10-03: qty 1

## 2023-10-03 MED ORDER — DICLOFENAC SUPPOSITORY 100 MG
RECTAL | Status: AC
  Filled 2023-10-03: qty 1

## 2023-10-03 MED ORDER — FENTANYL CITRATE (PF) 250 MCG/5ML IJ SOLN
INTRAMUSCULAR | Status: DC | PRN
  Administered 2023-10-03: 50 ug via INTRAVENOUS

## 2023-10-03 MED ORDER — FENTANYL CITRATE (PF) 100 MCG/2ML IJ SOLN
INTRAMUSCULAR | Status: AC
  Filled 2023-10-03: qty 2

## 2023-10-03 MED ORDER — DROPERIDOL 2.5 MG/ML IJ SOLN: 0.6250 mg | Freq: Once | INTRAMUSCULAR | Status: DC | PRN

## 2023-10-03 MED ORDER — SODIUM CHLORIDE 0.9 % IV SOLN: INTRAVENOUS | Status: DC | PRN

## 2023-10-03 MED ORDER — LIDOCAINE 2% (20 MG/ML) 5 ML SYRINGE
INTRAMUSCULAR | Status: DC | PRN
  Administered 2023-10-03: 100 mg via INTRAVENOUS

## 2023-10-03 MED ORDER — FENTANYL CITRATE (PF) 100 MCG/2ML IJ SOLN: 25.0000 ug | INTRAMUSCULAR | Status: DC | PRN

## 2023-10-03 MED ORDER — DEXAMETHASONE SODIUM PHOSPHATE 10 MG/ML IJ SOLN
INTRAMUSCULAR | Status: DC | PRN
  Administered 2023-10-03: 10 mg via INTRAVENOUS

## 2023-10-03 MED ORDER — DICLOFENAC SUPPOSITORY 100 MG
RECTAL | Status: DC | PRN
  Administered 2023-10-03: 100 mg via RECTAL

## 2023-10-03 NOTE — Progress Notes (Addendum)
 [facility]                                                                              Nicole [name], is a 73 y.o. female, DOB - [birth year], FMW:[id number] Admit date - 10/01/2023    Outpatient Primary MD for the patient is [name], [name], FNP  LOS - 2  days  Chief Complaint  Patient presents with   Abdominal Pain       Brief summary   Patient is a 73 year old female with history of Lynch syndrome, colon CA x2 s/p resection, renal cancer, skin CA, non-Hodkin's lymphoma, B12 deficiency who presented to the ED from GI office due to epigastric pain.  Initial work up on 7/8 with ultrasound revealed a dilated CBD (1.7cm) with intrahepatic dilatation and increased hepatic echogenicity. MRCP was recommended. When she presented to GI clinic on 7/16, she reported intermittent fevers as well.  She was sent to the ED where she has been hypertensive and afebrile (Tmax 25F). WBC normal, TBili 0.6, alkaline phosphatase 391, AST 79, ALT 176. MRCP showed a distended gallbladder with numerous stones, severe intra- and extra-hepatic ductal dilatation with CBD measuring 1.9cm with dependent gallstones. There appears to be tapering to the ampulla without a stone so ?stricture vs. occult mass.  Patient was transferred to [facility] for ERCP.   Assessment & Plan    Principal Problem:   Common bile duct obstruction, choledocholithiasis, early cholangitis - Continue IV Zosyn  - Underwent upper GI endoscopy which showed large periampullary ulcer, biopsies taken concern about duodenal versus ampullary cancer with Lynch syndrome however she has been taking high amount of ibuprofen  with no PPI.  IR versus attempt to get EUS guided bile duct access, not urgent. - Plan for CT abdomen pelvis with contrast, CA 19-9, CEA, twice daily PPI  Active Problems: Urinary tract infection - Urine culture with more than 100,000 colonies of E. coli - Continue IV Zosyn , will adjust antibiotics with  sensitivities    Lynch syndrome, history of colon CA x2 -Continue outpatient follow-up -  Follows outpatient with Dr. Dorsey (oncology) and GI, see #1     Renal cell carcinoma (HCC),  - Has history of left partial nephrectomy in 03/2005, pathology revealed stage T1a clear-cell. She had recurrent renal cell CA in 2020 and was treated with cryoablation on 05/20/2018  - has been following urology outpatient    NHL (non-Hodgkin's lymphoma) [facility]) -Follows Dr. Rogers, per last note, retroperitoneal lymphadenopathy stable, no B symptoms.    B12 deficiency - Continue B12 injections every 3 months  Obesity class II Estimated body mass index is 36.46 kg/m as calculated from the following:   Height as of this encounter: 5' 0.5 (1.537 m).   Weight as of this encounter: 86.1 kg.  Code Status: Full code DVT Prophylaxis:  Place and maintain sequential compression device Start: 10/02/23 1019   Level of Care: Level of care: Med-Surg Family Communication: Updated patient' Disposition Plan:      Remains inpatient appropriate:      Procedures:    Consultants:   Gastroenterology  Antimicrobials:   Anti-infectives (From admission, onward)    Start  Dose/Rate Route Frequency Ordered Stop   10/01/23 2200  piperacillin -tazobactam (ZOSYN ) IVPB 3.375 g        3.375 g 12.5 mL/hr over 240 Minutes Intravenous Every 8 hours 10/01/23 1844     10/01/23 1630  piperacillin -tazobactam (ZOSYN ) IVPB 3.375 g        3.375 g 100 mL/hr over 30 Minutes Intravenous  Once 10/01/23 1617 10/01/23 1701          Medications  diclofenac  100 mg Rectal Once      Subjective:   Dilpreet Faires was seen and examined today.  EGD today, currently no acute complaints.  No chest pain, shortness of breath, nausea vomiting or abdominal pain.  Seen after the procedure.   Objective:   Vitals:   10/03/23 1011 10/03/23 1015 10/03/23 1020 10/03/23 1030  BP: (!) 185/73 (!) 167/73 (!) 155/70 (!) 154/70   Pulse: 83 79 75 71  Resp: (!) 24 19 14 11   Temp: (!) 97.4 F (36.3 C)     TempSrc: Temporal     SpO2: 94% 96% 100% 97%  Weight:      Height:        Intake/Output Summary (Last 24 hours) at 10/03/2023 1148 Last data filed at 10/03/2023 1004 Gross per 24 hour  Intake 252.38 ml  Output 0 ml  Net 252.38 ml     Wt Readings from Last 3 Encounters:  10/01/23 86.1 kg  10/01/23 86.1 kg  07/30/23 87.1 kg   Physical Exam General: Alert and oriented x 3, NAD Cardiovascular: S1 S2 clear, RRR.  Respiratory: CTAB, no wheezing, rales or rhonchi Gastrointestinal: Soft, nontender, nondistended, NBS Ext: no pedal edema bilaterally Neuro: no new deficits Psych: Normal affect      Data Reviewed:  I have personally reviewed following labs    CBC Lab Results  Component Value Date   WBC 6.9 10/03/2023   RBC 4.00 10/03/2023   HGB 11.4 (L) 10/03/2023   HCT 35.7 (L) 10/03/2023   MCV 89.3 10/03/2023   MCH 28.5 10/03/2023   PLT 180 10/03/2023   MCHC 31.9 10/03/2023   RDW 15.0 10/03/2023   LYMPHSABS 0.9 10/01/2023   MONOABS 1.1 (H) 10/01/2023   EOSABS 0.1 10/01/2023   BASOSABS 0.1 10/01/2023     Last metabolic panel Lab Results  Component Value Date   NA 139 10/03/2023   K 4.2 10/03/2023   CL 107 10/03/2023   CO2 23 10/03/2023   BUN 21 10/03/2023   CREATININE 0.94 10/03/2023   GLUCOSE 125 (H) 10/03/2023   GFRNONAA >60 10/03/2023   GFRAA >60 09/16/2019   CALCIUM 8.6 (L) 10/03/2023   PROT 5.8 (L) 10/03/2023   ALBUMIN 2.7 (L) 10/03/2023   BILITOT 0.7 10/03/2023   ALKPHOS 258 (H) 10/03/2023   AST 41 10/03/2023   ALT 87 (H) 10/03/2023   ANIONGAP 9 10/03/2023    CBG (last 3)  No results for input(s): GLUCAP in the last 72 hours.    Coagulation Profile: Recent Labs  Lab 10/02/23 1137  INR 1.1     Radiology Studies: I have personally reviewed the imaging studies  MR ABDOMEN MRCP W WO CONTAST Result Date: 10/01/2023 CLINICAL DATA:  Cholelithiasis history of  ascending colon cancer EXAM: MRI ABDOMEN WITHOUT AND WITH CONTRAST (INCLUDING MRCP) TECHNIQUE: Multiplanar multisequence MR imaging of the abdomen was performed both before and after the administration of intravenous contrast. Heavily T2-weighted images of the biliary and pancreatic ducts were obtained, and three-dimensional MRCP images were rendered by  post processing. CONTRAST:  9mL GADAVIST  GADOBUTROL  1 MMOL/ML IV SOLN COMPARISON:  Right upper quadrant ultrasound, 09/23/2023 CT abdomen pelvis, 10/30/2022 FINDINGS: Lower chest: No acute abnormality. Hepatobiliary: No solid liver abnormality is seen. Distended gallbladder containing numerous gallstones. Severe intra and extrahepatic biliary ductal dilatation, the common bile duct measuring up to 1.9 cm in caliber and containing multiple small gallstones dependently (series 6, image 21). The duct however appears to taper to the ampulla without calculus of the ampulla (series 5, image 15). Pancreas: Unremarkable. No pancreatic ductal dilatation or surrounding inflammatory changes. Spleen: Normal in size without significant abnormality. Adrenals/Urinary Tract: Adrenal glands are unremarkable. Unchanged extensive left renal cortical scarring. Right kidney is normal, without obvious renal calculi, solid lesion, or hydronephrosis. Stomach/Bowel: Stomach is within normal limits. No evidence of bowel wall thickening, distention, or inflammatory changes. Status post partial right hemicolectomy. Vascular/Lymphatic: Aortic atherosclerosis. Enlarged retroperitoneal lymph nodes unchanged when compared to examination dated 10/30/2022, left retroperitoneal nodes measuring up 2 1.7 x 1.2 cm (series 17, image 51). Other: No abdominal wall hernia or abnormality. No ascites. Musculoskeletal: No acute or significant osseous findings. IMPRESSION: 1. Distended gallbladder containing numerous gallstones. 2. Severe intra and extrahepatic biliary ductal dilatation, the common bile duct  measuring up to 1.9 cm in caliber and containing multiple small gallstones dependently. The duct however appears to taper to the ampulla without calculus of the ampulla. Findings are highly suspicious for ampullary stricture or perhaps an occult mass. Notably, biliary ductal dilatation is new when compared to relatively recent prior CT dated 10/30/2022. Consider ERCP for further assessment. 3. Enlarged retroperitoneal lymph nodes unchanged when compared to examination dated 10/30/2022, left retroperitoneal nodes measuring up to 1.7 x 1.2 cm. 4. Status post partial right hemicolectomy. 5. Unchanged extensive left renal cortical scarring. Electronically Signed   By: Marolyn JONETTA Jaksch M.D.   On: 10/01/2023 16:01   MR 3D Recon At Scanner Result Date: 10/01/2023 CLINICAL DATA:  Cholelithiasis history of ascending colon cancer EXAM: MRI ABDOMEN WITHOUT AND WITH CONTRAST (INCLUDING MRCP) TECHNIQUE: Multiplanar multisequence MR imaging of the abdomen was performed both before and after the administration of intravenous contrast. Heavily T2-weighted images of the biliary and pancreatic ducts were obtained, and three-dimensional MRCP images were rendered by post processing. CONTRAST:  9mL GADAVIST  GADOBUTROL  1 MMOL/ML IV SOLN COMPARISON:  Right upper quadrant ultrasound, 09/23/2023 CT abdomen pelvis, 10/30/2022 FINDINGS: Lower chest: No acute abnormality. Hepatobiliary: No solid liver abnormality is seen. Distended gallbladder containing numerous gallstones. Severe intra and extrahepatic biliary ductal dilatation, the common bile duct measuring up to 1.9 cm in caliber and containing multiple small gallstones dependently (series 6, image 21). The duct however appears to taper to the ampulla without calculus of the ampulla (series 5, image 15). Pancreas: Unremarkable. No pancreatic ductal dilatation or surrounding inflammatory changes. Spleen: Normal in size without significant abnormality. Adrenals/Urinary Tract: Adrenal glands  are unremarkable. Unchanged extensive left renal cortical scarring. Right kidney is normal, without obvious renal calculi, solid lesion, or hydronephrosis. Stomach/Bowel: Stomach is within normal limits. No evidence of bowel wall thickening, distention, or inflammatory changes. Status post partial right hemicolectomy. Vascular/Lymphatic: Aortic atherosclerosis. Enlarged retroperitoneal lymph nodes unchanged when compared to examination dated 10/30/2022, left retroperitoneal nodes measuring up 2 1.7 x 1.2 cm (series 17, image 51). Other: No abdominal wall hernia or abnormality. No ascites. Musculoskeletal: No acute or significant osseous findings. IMPRESSION: 1. Distended gallbladder containing numerous gallstones. 2. Severe intra and extrahepatic biliary ductal dilatation, the common bile duct measuring up to  1.9 cm in caliber and containing multiple small gallstones dependently. The duct however appears to taper to the ampulla without calculus of the ampulla. Findings are highly suspicious for ampullary stricture or perhaps an occult mass. Notably, biliary ductal dilatation is new when compared to relatively recent prior CT dated 10/30/2022. Consider ERCP for further assessment. 3. Enlarged retroperitoneal lymph nodes unchanged when compared to examination dated 10/30/2022, left retroperitoneal nodes measuring up to 1.7 x 1.2 cm. 4. Status post partial right hemicolectomy. 5. Unchanged extensive left renal cortical scarring. Electronically Signed   By: Marolyn JONETTA Jaksch M.D.   On: 10/01/2023 16:01       Giannah Zavadil M.D. [facility] 10/03/2023, 11:48 AM  Available via Epic secure chat 7am-7pm After 7 pm, please refer to night coverage provider listed on amion.

## 2023-10-03 NOTE — H&P (Signed)
 [facility] History and Physical   Primary Care Physician:  [name] [name], FNP   Reason for Procedure:  Choledocholithiasis, biliary obstruction bile duct stenosis question stricture  Plan:    ERCP with biliary sphincterotomy, common duct stone extraction possible stent     HPI: Nicole [name] is a 73 y.o. female admitted yesterday and seen by Greig Corti PA-C.  She has symptomatic choledocholithiasis and distal bile duct stenosis.  She has had intermittent epigastric pain radiating into the back.  Please see yesterday's consultation note for additional details.  Her son and her mother had pancreatic cancer.  The patient has Lynch syndrome and has had 2 colon cancers. Past Medical History:  Diagnosis Date   Allergy ?   Anemia    Arthritis    shorulder   B12 deficiency    Chronic diarrhea SECONDARY HX COLON CANCER   History of colon cancer [facility] SYNDROME ---  COLORECTAL CANCER S/P COLECTOMY X3  LAST ONE 2007   NO RECURRENCE   History of colon cancer 05/04/2013   History of kidney infection 08/2013   History of kidney stones    History of MRSA infection 08/2013   left leg   Hx of osteopenia    Hyperlipidemia ?   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   NHL (non-Hodgkin's lymphoma) (HCC) 10/17/2010   MONITORED BY DR PERNELL   Personal history of renal cancer S/P LEFT PARTIAL NEPHRECTOMY 2007--  NO RECURRENCE   DUE TO KIDNEY CANCER   Right shoulder pain S/P ROTATOR CUFF REPAIR   Skin cancer     Past Surgical History:  Procedure Laterality Date   ABDOMINAL HYSTERECTOMY     APPENDECTOMY  1993   W/ LAVH   COLON SURGERY  2001,2002,2007   COLONOSCOPY W/ POLYPECTOMY     CYSTOSCOPY W/ RETROGRADES  07/14/2014   Procedure: CYSTOSCOPY WITH RETROGRADE PYELOGRAM;  Surgeon: Garnette Shack, MD;  Location: [facility];  Service: Urology;;   CYSTOSCOPY W/ URETERAL STENT REMOVAL Left 07/14/2014    Procedure: CYSTOSCOPY WITH STENT REMOVAL;  Surgeon: Garnette Shack, MD;  Location: [facility];  Service: Urology;  Laterality: Left;   CYSTOSCOPY WITH RETROGRADE PYELOGRAM, URETEROSCOPY AND STENT PLACEMENT Left 11/30/2020   Procedure: CYSTOSCOPY WITH RETROGRADE PYELOGRAM, URETEROSCOPY/STONE EXTRACTION AND STENT PLACEMENT;  Surgeon: Shack Garnette, MD;  Location: [facility];  Service: Urology;  Laterality: Left;   CYSTOSCOPY WITH RETROGRADE PYELOGRAM, URETEROSCOPY AND STENT PLACEMENT Left 06/24/2022   Procedure: CYSTOSCOPY WITH RETROGRADE PYELOGRAM, URETEROSCOPY AND STENT PLACEMENT;  Surgeon: Shack Garnette, MD;  Location: [facility];  Service: Urology;  Laterality: Left;   CYSTOSCOPY WITH STENT PLACEMENT  02/07/2012   Procedure: CYSTOSCOPY WITH STENT PLACEMENT;  Surgeon: Garnette Shack, MD;  Location: [facility];  Service: Urology;  Laterality: Left;   CYSTOSCOPY WITH STENT PLACEMENT Left 07/14/2014   Procedure: CYSTOSCOPY WITH STENT PLACEMENT;  Surgeon: Garnette Shack, MD;  Location: [facility];  Service: Urology;  Laterality: Left;   EYE SURGERY  ?   Removal of styes   FLEXIBLE SIGMOIDOSCOPY  08/29/2011   Procedure: FLEXIBLE SIGMOIDOSCOPY;  Surgeon: Claudis RAYMOND Rivet, MD;  [facility];  Service: Endoscopy;  Laterality: N/A;  930   FLEXIBLE SIGMOIDOSCOPY N/A 09/28/2012   Procedure: FLEXIBLE SIGMOIDOSCOPY;  Surgeon: Claudis RAYMOND Rivet, MD;  [facility];  Service: Endoscopy;  Laterality: N/A;  730   FLEXIBLE SIGMOIDOSCOPY N/A  07/08/2014   Procedure: FLEXIBLE SIGMOIDOSCOPY;  Surgeon: Claudis RAYMOND Rivet, MD;  [facility];  Service: Endoscopy;  Laterality: N/A;  830 - moved to 10:20 - Ann to notify pt   FLEXIBLE SIGMOIDOSCOPY N/A 08/11/2015   Procedure: FLEXIBLE SIGMOIDOSCOPY;  Surgeon: Claudis RAYMOND Rivet, MD;  [facility];  Service: Endoscopy;  Laterality: N/A;  730    FLEXIBLE SIGMOIDOSCOPY N/A 08/13/2016   Procedure: FLEXIBLE SIGMOIDOSCOPY;  Surgeon: Rivet Claudis RAYMOND, MD;  [facility];  Service: Endoscopy;  Laterality: N/A;  730   FLEXIBLE SIGMOIDOSCOPY N/A 10/06/2019   Procedure: FLEXIBLE SIGMOIDOSCOPY;  Surgeon: Rivet Claudis RAYMOND, MD;  [facility];  Service: Endoscopy;  Laterality: N/A;  1245   HEMICOLECTOMY  2001   right   HEMICOLECTOMY  2002   left   HOLMIUM LASER APPLICATION Left 07/14/2014   Procedure: HOLMIUM LASER LITHOTRIPSY,;  Surgeon: Garnette Shack, MD;  Location: [facility];  Service: Urology;  Laterality: Left;   HOLMIUM LASER APPLICATION Left 11/30/2020   Procedure: HOLMIUM LASER APPLICATION;  Surgeon: Shack Garnette, MD;  Location: [facility];  Service: Urology;  Laterality: Left;   HOLMIUM LASER APPLICATION Left 06/24/2022   Procedure: HOLMIUM LASER APPLICATION;  Surgeon: Shack Garnette, MD;  Location: [facility];  Service: Urology;  Laterality: Left;   IR RADIOLOGIST EVAL & MGMT  04/29/2018   IR RADIOLOGIST EVAL & MGMT  10/07/2019   IR RADIOLOGIST EVAL & MGMT  10/11/2020   IR RADIOLOGIST EVAL & MGMT  11/08/2021   kidney resection  2007   left partial   LAPAROSCOPIC ASSISTED VAGINAL HYSTERECTOMY  1993   W/ BILATERAL SALPINGO-OOPHORECTOMY   LEFT FLANK EXPLORATION W/  PARTIAL LEFT NEPHRECTOMY/ LEFT HILAR LYMPH NODE BX  04-17-2005  DR DAHLSTEDT   RENAL CELL CARCINOMA   LITHOTRIPSY  11-22013   x2   NEPHROLITHOTOMY Left 06/09/2014   Procedure: NEPHROLITHOTOMY PERCUTANEOUS;  Surgeon: Garnette Shack, MD;  Location: [facility];  Service: Urology;  Laterality: Left;  with STENT   RADIOLOGY WITH ANESTHESIA Left 05/20/2018   Procedure: CT WITH ANESTHESIA LEFT RENAL CRYOABLATION AND BIOPSY;  Surgeon: Vanice Sharper, MD;  Location: [facility];  Service: Anesthesiology;  Laterality: Left;   RIGHT ROTATOR  CUFF REPAIR/   BICEP REPAIR  10-17-2011  DR SUPPLE   SUBTOTAL  COLECTOMY  2007   URETEROSCOPY WITH HOLMIUM LASER LITHOTRIPSY Left 07/14/2014   Procedure: URETEROSCOPY  EXTRACTION OF STONES;  Surgeon: Garnette Shack, MD;  Location: [facility];  Service: Urology;  Laterality: Left;    Prior to Admission medications   Medication Sig Start Date End Date Taking? Authorizing Provider  acetaminophen  (TYLENOL ) 500 MG tablet Take 1,000 mg by mouth every 8 (eight) hours as needed for fever.   Yes [provider]  BIOTIN  PO Take 1 tablet by mouth daily.   Yes [provider]  Cholecalciferol  (VITAMIN D3) 125 MCG (5000 UT) CAPS Take 5,000 Units by mouth daily.   Yes [provider]  cyanocobalamin  (VITAMIN B12) 1000 MCG/ML injection ADMINISTER 1 ML(1000 MCG) IN THE MUSCLE EVERY 21 DAYS 10/31/22  Yes Rogers Hai, MD  diphenoxylate -atropine  (LOMOTIL ) 2.5-0.025 MG tablet Take 1 tablet by mouth 4 (four) times daily as needed for diarrhea or loose stools. 11/05/21  Yes Rogers Hai, MD  furosemide  (LASIX ) 40 MG tablet Take 40 mg by mouth daily as needed for fluid.    Yes [provider]  ibuprofen  (ADVIL ,MOTRIN ) 800 MG tablet  Take 800 mg by mouth 3 (three) times daily as needed for moderate pain. 08/01/16  Yes [provider]  magnesium oxide (MAG-OX) 400 (240 Mg) MG tablet Take 400 mg by mouth daily.   Yes [provider]  Potassium Citrate  15 MEQ (1620 MG) TBCR Take 1 tablet by mouth 3 (three) times daily. 08/01/23  Yes Matilda Senior, MD    Current Facility-Administered Medications  Medication Dose Route Frequency Provider Last Rate Last Admin   [MAR Hold] acetaminophen  (TYLENOL ) tablet 1,000 mg  1,000 mg Oral Q8H PRN Bryn Bernardino NOVAK, MD       [facility] Hold] diphenoxylate -atropine  (LOMOTIL ) 2.5-0.025 MG per tablet 1 tablet  1 tablet Oral QID PRN Bryn Bernardino NOVAK, MD       [facility] Hold] ondansetron  (ZOFRAN ) injection 4 mg  4 mg Intravenous Q6H PRN Rai, Nydia POUR, MD       [MAR Hold]  piperacillin -tazobactam (ZOSYN ) IVPB 3.375 g  3.375 g Intravenous Q8H Merrill, Kristin A, RPH 12.5 mL/hr at 10/03/23 0537 3.375 g at 10/03/23 9462   Facility-Administered Medications Ordered in Other Encounters  Medication Dose Route Frequency Provider Last Rate Last Admin   0.9 %  sodium chloride  infusion   Intravenous Continuous PRN Vergie Loader A, CRNA 10 mL/hr at 10/03/23 0859 New Bag at 10/03/23 0859    Allergies as of 10/01/2023 - Review Complete 10/01/2023  Allergen Reaction Noted   Latex Rash 10/19/2010   Morphine and codeine Other (See Comments) 10/19/2010   Tape Rash 06/01/2014    Family History  Problem Relation Age of Onset   Cancer Son    Early death Son    Cancer Mother    Cancer Maternal Grandfather     Social History   Socioeconomic History   Marital status: Divorced    Spouse name: Not on file   Number of children: Not on file   Years of education: Not on file   Highest education level: 12th grade  Occupational History   Not on file  Tobacco Use   Smoking status: Never   Smokeless tobacco: Never  Vaping Use   Vaping status: Never Used  Substance and Sexual Activity   Alcohol use: Not Currently    Comment: 2 glasses of wine per year - maybe   Drug use: Never   Sexual activity: Not Currently  Other Topics Concern   Not on file  Social History Narrative   Not on file   Social Drivers of Health   Financial Resource Strain: Low Risk  (07/30/2023)   Overall Financial Resource Strain (CARDIA)    Difficulty of Paying Living Expenses: Not very hard  Food Insecurity: No Food Insecurity (10/02/2023)   Hunger Vital Sign    Worried About Running Out of Food in the Last Year: Never true    Ran Out of Food in the Last Year: Never true  Transportation Needs: No Transportation Needs (10/02/2023)   PRAPARE - Administrator, Civil Service (Medical): No    Lack of Transportation (Non-Medical): No  Physical Activity: Insufficiently Active (07/27/2023)    Exercise Vital Sign    Days of Exercise per Week: 3 days    Minutes of Exercise per Session: 30 min  Stress: No Stress Concern Present (07/30/2023)   [employer] of Occupational Health - Occupational Stress Questionnaire    Feeling of Stress : Not at all  Social Connections: Moderately Integrated (10/02/2023)   Social Connection and Isolation Panel    Frequency of Communication  with Friends and Family: More than three times a week    Frequency of Social Gatherings with Friends and Family: Twice a week    Attends Religious Services: More than 4 times per year    Active Member of [employer] or Organizations: Yes    Attends Engineer, structural: More than 4 times per year    Marital Status: Divorced  Intimate Partner Violence: Not At Risk (10/02/2023)   Humiliation, Afraid, Rape, and Kick questionnaire    Fear of Current or Ex-Partner: No    Emotionally Abused: No    Physically Abused: No    Sexually Abused: No    Review of Systems:  All other review of systems negative except as mentioned in the HPI.  Physical Exam: Vital signs BP (!) 152/73   Pulse 84   Temp (!) 97.4 F (36.3 C) (Temporal)   Resp 11   Ht 5' 0.5 (1.537 m)   Wt 86.1 kg   SpO2 96%   BMI 36.46 kg/m   General:   Alert,  Well-developed, well-nourished, pleasant and cooperative in NAD Lungs:  Clear throughout to auscultation.   Heart:  Regular rate and rhythm; no murmurs, clicks, rubs,  or gallops. Abdomen:  Soft, nontender and nondistended. Normal bowel sounds.   Neuro/Psych:  Alert and cooperative. Normal mood and affect. A and O x 3   @Isaih Bulger  CHARLENA Commander, MD, [facility] [phone] (pager) 10/03/2023 9:10 AM@

## 2023-10-03 NOTE — Care Management Important Message (Signed)
 Important Message  Patient Details  Name: Nicole [name] MRN: [id number] Date of Birth: 10/02/1950   Important Message Given:  Yes - Medicare IM     Claretta Deed 10/03/2023, 4:11 PM

## 2023-10-03 NOTE — Plan of Care (Signed)

## 2023-10-03 NOTE — Op Note (Addendum)
 [facility] Patient Name: Nicole [name] Procedure Date : 10/03/2023 MRN: [id number] Attending MD: Lupita FORBES Commander , MD, [id number] Date of Birth: Apr 04, 1950 CSN: [id number] Age: 73 Admit Type: Inpatient Procedure:                Upper GI endoscopy (failed ERCP) Indications:              Abnormal MRCP - BILIARY OBSTRUCTION AND                            CHOLEDOCHOLITHIASIS Providers:                [name], MD, Mliss Eagles, RN, Coye Bade, Technician Referring MD:              Medicines:                General Anesthesia Complications:            No immediate complications. Estimated Blood Loss:     Estimated blood loss was minimal. Procedure:                Pre-Anesthesia Assessment:                           - Prior to the procedure, a History and Physical                            was performed, and patient medications and                            allergies were reviewed. The patient's tolerance of                            previous anesthesia was also reviewed. The risks                            and benefits of the procedure and the sedation                            options and risks were discussed with the patient.                            All questions were answered, and informed consent                            was obtained. Prior Anticoagulants: The patient has                            taken no anticoagulant or antiplatelet agents. ASA                            Grade Assessment: III - A patient with severe  systemic disease. After reviewing the risks and                            benefits, the patient was deemed in satisfactory                            condition to undergo the procedure.                           After obtaining informed consent, the endoscope was                            passed under direct vision. Throughout the                            procedure, the  patient's blood pressure, pulse, and                            oxygen saturations were monitored continuously. The                            [employer] D single use                            duodenoscope was introduced through the mouth, and                            advanced to the second part of duodenum. Papilla                            could not be found so The upper GI endoscopy was                            performed with difficulty due to papilla not                            visble. The patient tolerated the procedure well. Scope In: Scope Out: Findings:      One non-bleeding cratered duodenal ulcer with no stigmata of bleeding       was found in the second portion of the duodenum and at the major papilla       area. The lesion was 25 mm in largest dimension. Significant distortion       and edema of nearby tissue. Unable to find the papilla w/ duodenoscope.       No bile seen. Biopsies were taken with a cold forceps for histology.       Verification of patient identification for the specimen was done.       Estimated blood loss was minimal.      The exam was otherwise without abnormality.      The cardia and gastric fundus were normal on retroflexion. Impression:               - Non-bleeding duodenal ulcer with no stigmata of  bleeding. Biopsied. LARGE PERIAMPULLARY ULCER -                            COULD NOT FIND PAPILLA W/ DUODENOSCOPE. CHANGED TO                            GASTROSCOPE AND BIOPSIES TAKEN OF ULCER AND                            EDEMATOUS TISSUE SURROUNDING.                           IN A PATIENT WITH LYNCH SYNDROME AM CONCERNED ABOUT                            DUODENAL VS AMPULLARY CANCER. SHE HAS BEEN TAKING                            IBUPROFEN  800 MG BID AND NO PPI AT HOME SO COULD BE                            BENIGN.                           - The examination was otherwise normal. Recommendation:            - Return patient to hospital ward for ongoing care.                           - IR vs attempt to get relook ERCP/ EUS-guided bile                            [facility] - will discuss -                            Addendum have decided not to consult IR at this                            point and will hold depending upon clinical course.                            Pathology of ulcer biopsies will impact next steps                            also.                           Await pathology                           check CA 19-9, CEA                           bid PPI  continue Zosyn  for now but likely can convert to po                            Abx                           CT abd/pelvis with contrast                           H pylori stool Ag ( antrum looked NL but I did not                            take H pylori bxs) Procedure Code(s):        --- Professional ---                           [id number], Esophagogastroduodenoscopy, flexible,                            transoral; with biopsy, single or multiple Diagnosis Code(s):        --- Professional ---                           K26.9, Duodenal ulcer, unspecified as acute or                            chronic, without hemorrhage or perforation                           R93.3, Abnormal findings on diagnostic imaging of                            other parts of digestive tract CPT copyright 2022 American Medical Association. All rights reserved. The codes documented in this report are preliminary and upon coder review may  be revised to meet current compliance requirements. Lupita FORBES Commander, MD 10/03/2023 10:32:08 AM This report has been signed electronically. Number of Addenda: 0

## 2023-10-03 NOTE — Transfer of Care (Signed)
 Immediate Anesthesia Transfer of Care Note  Patient: Nicole [name]  Procedure(s) Performed: ERCP, WITH INTERVENTION IF INDICATED  Patient Location: PACU and Endoscopy Unit  Anesthesia Type:General  Level of Consciousness: awake, alert , oriented, patient cooperative, and responds to stimulation  Airway & Oxygen Therapy: Patient Spontanous Breathing  Post-op Assessment: Report given to RN and Post -op Vital signs reviewed and stable  Post vital signs: Reviewed and stable  Last Vitals:  Vitals Value Taken Time  BP    Temp    Pulse 80 10/03/23 10:12  Resp 24 10/03/23 10:12  SpO2 94 % 10/03/23 10:12  Vitals shown include unfiled device data.  Last Pain:  Vitals:   10/03/23 1011  TempSrc: Temporal  PainSc: 0-No pain      Patients Stated Pain Goal: 0 (10/03/23 0759)  Complications: No notable events documented.

## 2023-10-03 NOTE — Anesthesia Procedure Notes (Signed)
 Procedure Name: Intubation Date/Time: 10/03/2023 9:30 AM  Performed by: Vergie Ike LABOR, CRNAPre-anesthesia Checklist: Patient identified, Emergency Drugs available, Suction available and Patient being monitored Patient Re-evaluated:Patient Re-evaluated prior to induction Oxygen Delivery Method: Circle System Utilized Preoxygenation: Pre-oxygenation with 100% oxygen Induction Type: IV induction Ventilation: Mask ventilation without difficulty Laryngoscope Size: Miller and 3 Grade View: Grade II Tube type: Oral Tube size: 7.0 mm Number of attempts: 1 Airway Equipment and Method: Stylet Placement Confirmation: ETT inserted through vocal cords under direct vision, positive ETCO2 and breath sounds checked- equal and bilateral Secured at: 21 cm Tube secured with: Tape Dental Injury: Teeth and Oropharynx as per pre-operative assessment

## 2023-10-03 NOTE — Plan of Care (Signed)
   Problem: Education: Goal: Knowledge of General Education information will improve Description Including pain rating scale, medication(s)/side effects and non-pharmacologic comfort measures Outcome: Progressing

## 2023-10-03 NOTE — Progress Notes (Signed)
 Transition of Care [facility]) - Inpatient Brief Assessment   Patient Details  Name: Nicole [name] MRN: [id number] Date of Birth: [birth year]  [facility] CM/SW Contact:    Rosaline JONELLE Joe, RN Phone Number: 10/03/2023, 4:12 PM   Clinical Narrative: Patient admitted for Common bile duct obstruction, choledocholithiasis, early cholangitis  and UTI.  No IP Care management needs at this time but will continue to follow the patient for needs as patient progresses.   Transition of Care Asessment: Insurance and Status: (P) Insurance coverage has been reviewed Patient has primary care physician: (P) Yes Home environment has been reviewed: (P) from home   Prior/Current Home Services: (P) No current home services Social Drivers of Health Review: (P) SDOH reviewed no interventions necessary Readmission risk has been reviewed: (P) Yes Transition of care needs: (P) no transition of care needs at this time

## 2023-10-04 ENCOUNTER — Inpatient Hospital Stay (HOSPITAL_COMMUNITY)

## 2023-10-04 DIAGNOSIS — K838 Other specified diseases of biliary tract: Secondary | ICD-10-CM | POA: Diagnosis not present

## 2023-10-04 DIAGNOSIS — R509 Fever, unspecified: Secondary | ICD-10-CM | POA: Diagnosis not present

## 2023-10-04 DIAGNOSIS — K8050 Calculus of bile duct without cholangitis or cholecystitis without obstruction: Secondary | ICD-10-CM

## 2023-10-04 DIAGNOSIS — N390 Urinary tract infection, site not specified: Secondary | ICD-10-CM | POA: Diagnosis not present

## 2023-10-04 DIAGNOSIS — K269 Duodenal ulcer, unspecified as acute or chronic, without hemorrhage or perforation: Secondary | ICD-10-CM | POA: Diagnosis not present

## 2023-10-04 DIAGNOSIS — R748 Abnormal levels of other serum enzymes: Secondary | ICD-10-CM | POA: Diagnosis not present

## 2023-10-04 LAB — CBC
HCT: 36.8 % (ref 36.0–46.0)
Hemoglobin: 11.7 g/dL — ABNORMAL LOW (ref 12.0–15.0)
MCH: 28.3 pg (ref 26.0–34.0)
MCHC: 31.8 g/dL (ref 30.0–36.0)
MCV: 88.9 fL (ref 80.0–100.0)
Platelets: 210 K/uL (ref 150–400)
RBC: 4.14 MIL/uL (ref 3.87–5.11)
RDW: 14.7 % (ref 11.5–15.5)
WBC: 8.9 K/uL (ref 4.0–10.5)
nRBC: 0 % (ref 0.0–0.2)

## 2023-10-04 LAB — COMPREHENSIVE METABOLIC PANEL WITH GFR
ALT: 78 U/L — ABNORMAL HIGH (ref 0–44)
AST: 29 U/L (ref 15–41)
Albumin: 2.9 g/dL — ABNORMAL LOW (ref 3.5–5.0)
Alkaline Phosphatase: 214 U/L — ABNORMAL HIGH (ref 38–126)
Anion gap: 11 (ref 5–15)
BUN: 19 mg/dL (ref 8–23)
CO2: 20 mmol/L — ABNORMAL LOW (ref 22–32)
Calcium: 8.9 mg/dL (ref 8.9–10.3)
Chloride: 107 mmol/L (ref 98–111)
Creatinine, Ser: 0.84 mg/dL (ref 0.44–1.00)
GFR, Estimated: >60 mL/min (ref >60)
Glucose, Bld: 128 mg/dL — ABNORMAL HIGH (ref 70–99)
Potassium: 4.3 mmol/L (ref 3.5–5.1)
Sodium: 138 mmol/L (ref 135–145)
Total Bilirubin: 0.6 mg/dL (ref 0.0–1.2)
Total Protein: 6.3 g/dL — ABNORMAL LOW (ref 6.5–8.1)

## 2023-10-04 MED ORDER — PANTOPRAZOLE SODIUM 40 MG PO TBEC: 40.0000 mg | DELAYED_RELEASE_TABLET | Freq: Two times a day (BID) | ORAL | Status: DC

## 2023-10-04 MED ORDER — IOHEXOL 12 MG/ML PO SOLN
500.0000 mL | ORAL | Status: AC
  Administered 2023-10-04 (×2): 500 mL via ORAL

## 2023-10-04 MED ORDER — IOHEXOL 9 MG/ML PO SOLN
ORAL | Status: AC
  Filled 2023-10-04: qty 1000

## 2023-10-04 MED ORDER — IOHEXOL 350 MG/ML SOLN
75.0000 mL | Freq: Once | INTRAVENOUS | Status: AC | PRN
  Administered 2023-10-04: 75 mL via INTRAVENOUS

## 2023-10-04 NOTE — Plan of Care (Signed)

## 2023-10-04 NOTE — Progress Notes (Signed)
 [facility]                                                                              Nicole [name], is a 73 y.o. female, DOB - 23-Sep-1950, FMW:[id number] Admit date - 10/01/2023    Outpatient Primary MD for the patient is [name], [name], FNP  LOS - 3  days  Chief Complaint  Patient presents with   Abdominal Pain       Brief summary   Patient is a 73 year old female with history of Lynch syndrome, colon CA x2 s/p resection, renal cancer, skin CA, non-Hodkin's lymphoma, B12 deficiency who presented to the ED from GI office due to epigastric pain.  Initial work up on 7/8 with ultrasound revealed a dilated CBD (1.7cm) with intrahepatic dilatation and increased hepatic echogenicity. MRCP was recommended. When she presented to GI clinic on 7/16, she reported intermittent fevers as well.  She was sent to the ED where she has been hypertensive and afebrile (Tmax 53F). WBC normal, TBili 0.6, alkaline phosphatase 391, AST 79, ALT 176. MRCP showed a distended gallbladder with numerous stones, severe intra- and extra-hepatic ductal dilatation with CBD measuring 1.9cm with dependent gallstones. There appears to be tapering to the ampulla without a stone so ?stricture vs. occult mass.  Patient was transferred to [facility] for ERCP.   Assessment & Plan    Principal Problem:   Common bile duct obstruction, choledocholithiasis, early cholangitis - Continue IV Zosyn  - EGD 7/18 showed large periampullary ulcer, biopsies taken concern about duodenal versus ampullary cancer with Lynch syndrome however she has been taking high amount of ibuprofen  with no PPI.  IR versus attempt to get EUS guided bile duct access, not urgent.  ERCP unsuccessful - CT abdomen pending, CA 19-9, CEA pending - Continue IV PPI twice daily, GI following  Active Problems: E. coli urinary tract infection - Urine culture with more than 100,000 colonies of E. coli - Continue IV Zosyn , sensitivities pending     Lynch syndrome, history of colon CA x2 -Continue outpatient follow-up -  Follows outpatient with Dr. Dorsey (oncology) and GI, see #1     Renal cell carcinoma (HCC),  - Has history of left partial nephrectomy in 03/2005, pathology revealed stage T1a clear-cell. She had recurrent renal cell CA in 2020 and was treated with cryoablation on 05/20/2018  - has been following urology outpatient    NHL (non-Hodgkin's lymphoma) [facility]) -Follows Dr. Rogers, per last note, retroperitoneal lymphadenopathy stable, no B symptoms.    B12 deficiency - Continue B12 injections every 3 months  Obesity class II Estimated body mass index is 36.46 kg/m as calculated from the following:   Height as of this encounter: 5' 0.5 (1.537 m).   Weight as of this encounter: 86.1 kg.  Code Status: Full code DVT Prophylaxis:  Place and maintain sequential compression device Start: 10/02/23 1019   Level of Care: Level of care: Med-Surg Family Communication: Updated patient' Disposition Plan:      Remains inpatient appropriate:   Pending CT abdomen and GI recommendation regarding further management or discharge   Procedures:    Consultants:   Gastroenterology  Antimicrobials:   Anti-infectives (From admission, onward)    Start     Dose/Rate Route Frequency Ordered Stop   10/01/23 2200  piperacillin -tazobactam (ZOSYN ) IVPB 3.375 g        3.375 g 12.5 mL/hr over 240 Minutes Intravenous Every 8 hours 10/01/23 1844     10/01/23 1630  piperacillin -tazobactam (ZOSYN ) IVPB 3.375 g        3.375 g 100 mL/hr over 30 Minutes Intravenous  Once 10/01/23 1617 10/01/23 1701          Medications  diclofenac   100 mg Rectal Once   iohexol   500 mL Oral Q1H   pantoprazole   40 mg Oral BID AC      Subjective:   Nicole [name] was seen and examined today.  Overnight no acute complaints.  No chest pain, shortness of breath, fevers or chills.  No abdominal pain.   Objective:   Vitals:   10/03/23 2043  10/04/23 0006 10/04/23 0430 10/04/23 0826  BP: 137/85 118/66 (!) 137/55 (!) 169/79  Pulse: 85 75 67 68  Resp: 18 17 18    Temp: 98.2 F (36.8 C) 98.6 F (37 C) 97.7 F (36.5 C) 98.8 F (37.1 C)  TempSrc: Oral Oral Oral Oral  SpO2: 96% 95% 96% 100%  Weight:      Height:       No intake or output data in the 24 hours ending 10/04/23 1122    Wt Readings from Last 3 Encounters:  10/01/23 86.1 kg  10/01/23 86.1 kg  07/30/23 87.1 kg   Physical Exam General: Alert and oriented x 3, NAD Cardiovascular: S1 S2 clear, RRR.  Respiratory: CTAB, no wheezing Gastrointestinal: Soft, nontender, nondistended, NBS Ext: no pedal edema bilaterally Neuro: no new deficits Psych: Normal affect       Data Reviewed:  I have personally reviewed following labs    CBC Lab Results  Component Value Date   WBC 8.9 10/04/2023   RBC 4.14 10/04/2023   HGB 11.7 (L) 10/04/2023   HCT 36.8 10/04/2023   MCV 88.9 10/04/2023   MCH 28.3 10/04/2023   PLT 210 10/04/2023   MCHC 31.8 10/04/2023   RDW 14.7 10/04/2023   LYMPHSABS 0.9 10/01/2023   MONOABS 1.1 (H) 10/01/2023   EOSABS 0.1 10/01/2023   BASOSABS 0.1 10/01/2023     Last metabolic panel Lab Results  Component Value Date   NA 138 10/04/2023   K 4.3 10/04/2023   CL 107 10/04/2023   CO2 20 (L) 10/04/2023   BUN 19 10/04/2023   CREATININE 0.84 10/04/2023   GLUCOSE 128 (H) 10/04/2023   GFRNONAA >60 10/04/2023   GFRAA >60 09/16/2019   CALCIUM 8.9 10/04/2023   PROT 6.3 (L) 10/04/2023   ALBUMIN 2.9 (L) 10/04/2023   BILITOT 0.6 10/04/2023   ALKPHOS 214 (H) 10/04/2023   AST 29 10/04/2023   ALT 78 (H) 10/04/2023   ANIONGAP 11 10/04/2023    CBG (last 3)  No results for input(s): GLUCAP in the last 72 hours.    Coagulation Profile: Recent Labs  Lab 10/02/23 1137  INR 1.1     Radiology Studies: I have personally reviewed the imaging studies  No results found.      Nydia Distance M.D. [facility] 10/04/2023, 11:22  AM  Available via Epic secure chat 7am-7pm After 7 pm, please refer to night coverage provider listed on amion.

## 2023-10-04 NOTE — Anesthesia Postprocedure Evaluation (Signed)
 Anesthesia Post Note  Patient: Nicole [name]  Procedure(s) Performed: EGD (ESOPHAGOGASTRODUODENOSCOPY)     Patient location during evaluation: PACU Anesthesia Type: General Level of consciousness: awake and alert Pain management: pain level controlled Vital Signs Assessment: post-procedure vital signs reviewed and stable Respiratory status: spontaneous breathing, nonlabored ventilation and respiratory function stable Cardiovascular status: blood pressure returned to baseline Postop Assessment: no apparent nausea or vomiting Anesthetic complications: no   No notable events documented.            Vertell Row

## 2023-10-04 NOTE — Progress Notes (Addendum)
 Patient ID: Nicole [name], female   DOB: [birth year], 73 y.o.   MRN: [id number]    Progress Note   Subjective   Day # 2 CC; intermittent epigastric pain, nausea, low-grade fever with choledocholithiasis found on imaging at outside facility  ERCP -finding of 1 nonbleeding cratered duodenal ulcer measuring 25 mm in the second portion of the duodenum at the area of the major papilla there was significant distortion and edema of nearby tissue and unable to find the papilla with the duodena scope and so ERCP not able to be accomplished.  Biopsies taken from the ulcer  Biopsies pending-asked that they be rushed CEA/CA 19-9 pending WBC 8.9/hemoglobin 11.7/hematocrit 36.8  Patient is up and around in the room, no complaints of abdominal discomfort able to eat solid food without difficulty   Objective   Vital signs in last 24 hours: Temp:  [97.4 F (36.3 C)-98.8 F (37.1 C)] 98.8 F (37.1 C) (07/19 0826) Pulse Rate:  [67-88] 68 (07/19 0826) Resp:  [17-18] 18 (07/19 0430) BP: (118-169)/(55-85) 169/79 (07/19 0826) SpO2:  [90 %-100 %] 100 % (07/19 0826) Last BM Date : 10/01/23 General:   Elderly white female in NAD Heart:  Regular rate and rhythm; no murmurs Lungs: Respirations even and unlabored, lungs CTA bilaterally Abdomen:  Soft, obese, basically nontender, normal bowel sounds.,  No mass or hepatosplenomegaly Extremities:  Without edema. Neurologic:  Alert and oriented,  grossly normal neurologically. Psych:  Cooperative. Normal mood and affect.  Intake/Output from previous day: 07/18 0701 - 07/19 0700 In: 50 [I.V.:50] Out: 0  Intake/Output this shift: No intake/output data recorded.  Lab Results: Recent Labs    10/02/23 0313 10/03/23 0233 10/04/23 0241  WBC 7.6 6.9 8.9  HGB 11.6* 11.4* 11.7*  HCT 36.6 35.7* 36.8  PLT 180 180 210   BMET Recent Labs    10/02/23 0313 10/03/23 0233 10/04/23 0241  NA 140 139 138  K 3.9 4.2 4.3  CL 108 107 107  CO2 23 23 20*   GLUCOSE 112* 125* 128*  BUN 16 21 19   CREATININE 0.78 0.94 0.84  CALCIUM 8.5* 8.6* 8.9   LFT Recent Labs    10/04/23 0241  PROT 6.3*  ALBUMIN 2.9*  AST 29  ALT 78*  ALKPHOS 214*  BILITOT 0.6   PT/INR Recent Labs    10/02/23 1137  LABPROT 14.6  INR 1.1    Studies/Results: No results found.     Assessment / Plan:    #73 73 year old female with history of Lynch syndrome status post colon cancer x 2 now with subtotal colectomy with distal sigmoid and rectum in situ, history of non-Hodgkin's lymphoma and renal cell CA who presents with 2 months history of intermittent episodes of epigastric pain and nausea, episode this past weekend associated with low-grade fever and a couple of episodes in May associated with brief fevers and chills.   Ultrasound at outside facility did show ductal dilation. MRI/MRCP shows multiple gallstones, no cholecystitis, dilated common bile duct with multiple gallstones and distal common bile duct tapered to the ampulla without stones seen.  ERCP yesterday unsuccessful due to finding of a large ulcer in the second portion of the duodenum in the area of the major papilla, there was significant distortion and inability to locate the papilla to attempt ERCP.  Biopsies were taken from the ulcer, pending  Patient had been taking some NSAIDs outpatient so possible this is a benign NSAID related ulcer but also concern for duodenal or ampullary  neoplasm.  Plan; Continue regular diet Twice daily PPI H. pylori stool antigen Continue Zosyn ,-can convert to oral antibiotic at discharge Await tumor markers Await biopsy CT abdomen and pelvis today Will discuss further with Dr. Avram, plan is to reach out to [facility]/Dr. Minnie for potential EUS guided biliary drainage.  Patient asked about an external procedure that was mentioned, and I did discuss PTC with her, as an option which is more painful , and generally involves multistep procedures.  If drainage can  be accomplished endoscopically then we can avoid PTC.  Addendum-we have had discussion with advanced biliary at [facility] hope to be able to accommodate patient on Tuesday or Wednesday for day trip Contact person for biliary is Nicole [name] fellow Cell #[phone]-will be on service through Wednesday      LOS: 3 days   Nicole Sprick EsterwoodPA-C  10/04/2023, 10:35 AM     [facility] Attending   I have taken an interval history, reviewed the chart and examined the patient.  Majority of medical decision making performed by me.  I agree with the Advanced Practitioner's note, impression and recommendations with the following additions:  Will wait for CT scan, pathology report.  She may require EUS guided bile duct access which is not available here.  If this is a malignant ulcer she would need an EUS also and we do not have availability next week or at least not in the first part.  Fortunately she is tolerating her choledocholithiasis and biliary obstruction at this point.  We will follow-up tomorrow.  Nicole [name], MD, [facility] See Nicole [name] on call - gastroenterology for best contact person 10/04/2023 1:35 PM

## 2023-10-04 NOTE — Telephone Encounter (Signed)
   Biliary & Advanced Endoscopy Consult Service  Telephone Note     Recommendations:  Nicole [name] is a 73 y.o. female with history of Lynch syndrome, two prior colon cancers s/p subtotal colectomy, renal cell carcinoma, non-hodgkin lymphoma who presented to [facility] with intermittent epigastric pain and nausea for past few months. She presented afebrile and HD stable. Most recent LFTs with Tbili 0.6, AST 29, Alt 78, Alk phos 214. Improved since admission. Tumor markers pending. CT imaging notable for hypo-enhancement at the ampulla. MRCP showed multiple gallstones, severe extra and intra ductal dilation, CBD measuring 1.9 cm with multiple stones in the duct, and distal tapering of the ampulla without visualized stone. No cholecystitis. ERCP attempted 7/18 and patient found to have a large ulcer at the papilla measuring 2.5 cm and provider was unable to cannulate the biliary tree. Biopsies obtained. No history of altered anatomy. Not on anticoagulation. Provider requesting ERCP with possible rendezvous technique.  Recommendations: -- Please powershare MRCP and CT images  -- Recommended provider contact us  on Monday 7/21 about possible day trip procedure vs outpatient procedure if she remains stable. I provided provider with my cell phone number. -- If biopsies confirm malignancy, recommend surgical oncology consult  Neil Polio [facility] Fellow Pager: [phone]

## 2023-10-05 DIAGNOSIS — K8050 Calculus of bile duct without cholangitis or cholecystitis without obstruction: Secondary | ICD-10-CM | POA: Diagnosis not present

## 2023-10-05 DIAGNOSIS — R748 Abnormal levels of other serum enzymes: Secondary | ICD-10-CM | POA: Diagnosis not present

## 2023-10-05 DIAGNOSIS — R509 Fever, unspecified: Secondary | ICD-10-CM | POA: Diagnosis not present

## 2023-10-05 DIAGNOSIS — N390 Urinary tract infection, site not specified: Secondary | ICD-10-CM | POA: Diagnosis not present

## 2023-10-05 LAB — COMPREHENSIVE METABOLIC PANEL WITH GFR
ALT: 60 U/L — ABNORMAL HIGH (ref 0–44)
AST: 26 U/L (ref 15–41)
Albumin: 2.7 g/dL — ABNORMAL LOW (ref 3.5–5.0)
Alkaline Phosphatase: 162 U/L — ABNORMAL HIGH (ref 38–126)
Anion gap: 11 (ref 5–15)
BUN: 22 mg/dL (ref 8–23)
CO2: 20 mmol/L — ABNORMAL LOW (ref 22–32)
Calcium: 8.7 mg/dL — ABNORMAL LOW (ref 8.9–10.3)
Chloride: 109 mmol/L (ref 98–111)
Creatinine, Ser: 0.89 mg/dL (ref 0.44–1.00)
GFR, Estimated: >60 mL/min (ref >60)
Glucose, Bld: 105 mg/dL — ABNORMAL HIGH (ref 70–99)
Potassium: 4.1 mmol/L (ref 3.5–5.1)
Sodium: 140 mmol/L (ref 135–145)
Total Bilirubin: 0.4 mg/dL (ref 0.0–1.2)
Total Protein: 5.7 g/dL — ABNORMAL LOW (ref 6.5–8.1)

## 2023-10-05 LAB — CBC
HCT: 34.4 % — ABNORMAL LOW (ref 36.0–46.0)
Hemoglobin: 11 g/dL — ABNORMAL LOW (ref 12.0–15.0)
MCH: 28.5 pg (ref 26.0–34.0)
MCHC: 32 g/dL (ref 30.0–36.0)
MCV: 89.1 fL (ref 80.0–100.0)
Platelets: 187 K/uL (ref 150–400)
RBC: 3.86 MIL/uL — ABNORMAL LOW (ref 3.87–5.11)
RDW: 15.1 % (ref 11.5–15.5)
WBC: 8 K/uL (ref 4.0–10.5)
nRBC: 0 % (ref 0.0–0.2)

## 2023-10-05 LAB — URINE CULTURE: Culture: >=100000 — AB

## 2023-10-05 MED ORDER — AMOXICILLIN-POT CLAVULANATE 875-125 MG PO TABS
1.0000 | ORAL_TABLET | Freq: Two times a day (BID) | ORAL | Status: DC
  Administered 2023-10-05 – 2023-10-06 (×3): 1 via ORAL
  Filled 2023-10-05 (×3): qty 1

## 2023-10-05 NOTE — Plan of Care (Signed)

## 2023-10-05 NOTE — Progress Notes (Signed)
 [facility]                                                                              Nicole [name], is a 73 y.o. female, DOB - [birth year], FMW:[id number] Admit date - 10/01/2023    Outpatient Primary MD for the patient is Nicole, [name], FNP  LOS - 4  days  Chief Complaint  Patient presents with   Abdominal Pain       Brief summary   Patient is a 73 year old female with history of Lynch syndrome, colon CA x2 s/p resection, renal cancer, skin CA, non-Hodkin's lymphoma, B12 deficiency who presented to the ED from GI office due to epigastric pain.  Initial work up on 7/8 with ultrasound revealed a dilated CBD (1.7cm) with intrahepatic dilatation and increased hepatic echogenicity. MRCP was recommended. When she presented to GI clinic on 7/16, she reported intermittent fevers as well.  She was sent to the ED where she has been hypertensive and afebrile (Tmax 20F). WBC normal, TBili 0.6, alkaline phosphatase 391, AST 79, ALT 176. MRCP showed a distended gallbladder with numerous stones, severe intra- and extra-hepatic ductal dilatation with CBD measuring 1.9cm with dependent gallstones. There appears to be tapering to the ampulla without a stone so ?stricture vs. occult mass.  Patient was transferred to [facility] for ERCP.   Assessment & Plan    Principal Problem:   Common bile duct obstruction, choledocholithiasis, early cholangitis - Continue IV Zosyn  - EGD 7/18 showed large periampullary ulcer, biopsies taken concern about duodenal versus ampullary cancer with Lynch syndrome however she has been taking high amount of ibuprofen  with no PPI.  IR versus attempt to get EUS guided bile duct access, not urgent.  ERCP unsuccessful - CA 19-9, CEA pending, LFTs improving - Continue PPI twice daily  - CT abdomen showed numerous gallstones and evidence of choledocholithiasis with dilated biliary tree.  Some thickening in the area of the pancreatic head with small cystic area and  narrowing of the distal common duct at the level of the ampulla, recommend further evaluation to exclude ampullary lesion - Discussed with GI, Dr.Gessner, considering transfer to [facility] for advanced endoscopic biliary drainage  Active Problems: E. coli urinary tract infection - Urine culture with more than 100,000 colonies of E. coli - Continue IV Zosyn , sensitivities pending    Lynch syndrome, history of colon CA x2 -Continue outpatient follow-up -  Follows outpatient with Dr. Dorsey (oncology) and GI, see #1     Renal cell carcinoma (HCC),  - Has history of left partial nephrectomy in 03/2005, pathology revealed stage T1a clear-cell. She had recurrent renal cell CA in 2020 and was treated with cryoablation on 05/20/2018  - has been following urology outpatient    NHL (non-Hodgkin's lymphoma) [facility]) -Follows Dr. Rogers, per last note, retroperitoneal lymphadenopathy stable, no B symptoms.    B12 deficiency - Continue B12 injections every 3 months  Obesity class II Estimated body mass index is 36.46 kg/m as calculated from the following:   Height as of this encounter: 5' 0.5 (1.537 m).   Weight as of this encounter: 86.1 kg.  Code Status:  Full code DVT Prophylaxis:  Place and maintain sequential compression device Start: 10/02/23 1019   Level of Care: Level of care: Med-Surg Family Communication: Updated patient' Disposition Plan:      Remains inpatient appropriate:   Pending GI recommendations   Procedures:  EGD  Consultants:   Gastroenterology  Antimicrobials:   Anti-infectives (From admission, onward)    Start     Dose/Rate Route Frequency Ordered Stop   10/01/23 2200  piperacillin -tazobactam (ZOSYN ) IVPB 3.375 g        3.375 g 12.5 mL/hr over 240 Minutes Intravenous Every 8 hours 10/01/23 1844     10/01/23 1630  piperacillin -tazobactam (ZOSYN ) IVPB 3.375 g        3.375 g 100 mL/hr over 30 Minutes Intravenous  Once 10/01/23 1617 10/01/23 1701           Medications  diclofenac   100 mg Rectal Once   pantoprazole   40 mg Oral BID AC      Subjective:   Nicole [name] was seen and examined today.  No acute complaints, no fever or chills, chest pain, shortness of breath or abdominal pain.     Objective:   Vitals:   10/04/23 2000 10/04/23 2320 10/05/23 0403 10/05/23 0738  BP: 130/78 (!) 144/81 139/65 126/77  Pulse: 76 64 65 (!) 55  Resp: 16 17 17    Temp: 97.8 F (36.6 C) 98.7 F (37.1 C) 98.1 F (36.7 C) 97.9 F (36.6 C)  TempSrc: Oral   Oral  SpO2: 100% 100% 99% 100%  Weight:      Height:       No intake or output data in the 24 hours ending 10/05/23 1102    Wt Readings from Last 3 Encounters:  10/01/23 86.1 kg  10/01/23 86.1 kg  07/30/23 87.1 kg   Physical Exam General: Alert and oriented x 3, NAD Cardiovascular: S1 S2 clear, RRR.  Respiratory: CTAB Gastrointestinal: Soft, nontender, nondistended, NBS Ext: no pedal edema bilaterally Neuro: no new deficits Psych: Normal affect       Data Reviewed:  I have personally reviewed following labs    CBC Lab Results  Component Value Date   WBC 8.0 10/05/2023   RBC 3.86 (L) 10/05/2023   HGB 11.0 (L) 10/05/2023   HCT 34.4 (L) 10/05/2023   MCV 89.1 10/05/2023   MCH 28.5 10/05/2023   PLT 187 10/05/2023   MCHC 32.0 10/05/2023   RDW 15.1 10/05/2023   LYMPHSABS 0.9 10/01/2023   MONOABS 1.1 (H) 10/01/2023   EOSABS 0.1 10/01/2023   BASOSABS 0.1 10/01/2023     Last metabolic panel Lab Results  Component Value Date   NA 140 10/05/2023   K 4.1 10/05/2023   CL 109 10/05/2023   CO2 20 (L) 10/05/2023   BUN 22 10/05/2023   CREATININE 0.89 10/05/2023   GLUCOSE 105 (H) 10/05/2023   GFRNONAA >60 10/05/2023   GFRAA >60 09/16/2019   CALCIUM 8.7 (L) 10/05/2023   PROT 5.7 (L) 10/05/2023   ALBUMIN 2.7 (L) 10/05/2023   BILITOT 0.4 10/05/2023   ALKPHOS 162 (H) 10/05/2023   AST 26 10/05/2023   ALT 60 (H) 10/05/2023   ANIONGAP 11 10/05/2023    CBG  (last 3)  No results for input(s): GLUCAP in the last 72 hours.    Coagulation Profile: Recent Labs  Lab 10/02/23 1137  INR 1.1     Radiology Studies: I have personally reviewed the imaging studies  CT ABDOMEN PELVIS W CONTRAST Result Date: 10/04/2023 CLINICAL DATA:  Duodenal ulcer. Lynch syndrome. Ascending colon cancer history. EXAM: CT ABDOMEN AND PELVIS WITH CONTRAST TECHNIQUE: Multidetector CT imaging of the abdomen and pelvis was performed using the standard protocol following bolus administration of intravenous contrast. RADIATION DOSE REDUCTION: This exam was performed according to the departmental dose-optimization program which includes automated exposure control, adjustment of the mA and/or kV according to patient size and/or use of iterative reconstruction technique. CONTRAST:  75mL OMNIPAQUE  IOHEXOL  350 MG/ML SOLN COMPARISON:  CT 10/30/2022. MRI abdomen 10/01/2023. Abdominal ultrasound 09/23/2023 FINDINGS: Lower chest: There is some linear opacity lung bases likely scar or atelectasis. Areas of bronchiectasis along the medial right lower lobe, unchanged from previous. Some tree-in-bud reticulonodular changes identified along the extreme inferior aspect of the middle lobe, nonspecific. Small calcification left lower lobe image 45 of series 5. Hepatobiliary: Dilated biliary tree identified. Increased from the study of 2024. On recent MRI the dilatation with similar. Several filling defects in the distal common duct on the prior MRI. There are several seen today but there seen on the previous MRCP. Dilated gallbladder as well with numerous stones. Please correlate with history. Once again there is some mucosal thickening and enhancement along the distal common duct with common abrupt taper appearance at the ampulla. No space-occupying liver lesion. Patent portal vein. Pancreas: No pancreatic ductal dilatation or pancreatic atrophy. Small cystic area identified adjacent to the common duct is  stable on image 35. Please correlate with prior MRCP. Spleen: Normal in size without focal abnormality. Adrenals/Urinary Tract: Adrenal glands are preserved. Small Bosniak 1 right-sided anterior renal cyst as seen on previous examinations. No enhancing right-sided renal mass. Left kidney has focal areas of severe atrophy with calcifications towards the lower pole and distortion. Adjacent perinephric fat stranding and thickening. Please correlate with known history and any prior intervention. Change were seen on the study of 2024 as well. Stomach/Bowel: Surgical changes of subtotal colectomy with primary anastomosis in the left hemipelvis. Oral contrast was administered. Overall the bowel is nondilated. No abnormal soft tissue at the surgical anastomosis. Scattered clips throughout the mesentery as well. Vascular/Lymphatic: Normal caliber aorta and IVC. Scattered vascular calcifications. Several enlarged retroperitoneal and retrocrural nodes identified. Example left periaortic on image 33 of series 3 measures 2.5 x 1.5 cm. In August 2024 this same node measured 2.3 by 1.4 cm. Additional nodes included retrocaval on image 35 today measuring 2.9 by 1.5 cm and going back to August 2024 2.7 x 1.4 cm. Other nodes are similar as well. Reproductive: Status post hysterectomy. No adnexal masses. Other: No free air or free fluid. Musculoskeletal: Scattered degenerative changes. Curvature of the spine IMPRESSION: Again there are numerous gallstones and evidence of choledocholithiasis with a dilated biliary tree similar to the recent MRI. There is some thickening in the area of the pancreatic head with a small cystic area and narrowing of the distal common duct at the level of the ampulla with some mucosal thickening. Recommend further evaluation to exclude an ampullary lesion when clinically appropriate. Surgical changes of subtotal colectomy. Stable multifocal atrophy of the left kidney with distortion and stones. Stable  numerous enlarged lymph nodes in the retroperitoneum and retrocrural spaces going back to August 2024. Please correlate for known history. Electronically Signed   By: Ranell Bring M.D.   On: 10/04/2023 14:39        Rain Wilhide M.D. [facility] 10/05/2023, 11:02 AM  Available via Epic secure chat 7am-7pm After 7 pm, please refer to night coverage provider listed on amion.

## 2023-10-05 NOTE — Progress Notes (Signed)
 Patient ID: Nicole [name], female   DOB: Sep 14, 1950, 73 y.o.   MRN: [id number]    Progress Note   Subjective   Day # 4 CC; remittent epigastric pain, nausea, low-grade fever with finding of choledocholithiasis on imaging at outside facility  IV Zosyn   ERCP here with finding of 1 nonbleeding cratered duodenal ulcer/25 mm in the second portion of the duodenum in the area of the major papilla where there was significant distortion and edema.  Biopsies taken of the ulcer, unable to do ERCP  Biopsy pending CEA/CA 19 pending H. pylori antigen pending Today WBC 8.0/hemoglobin 11/hematocrit 34.4 Creatinine 0.89 T. bili 0.4/alk phos 162/AST 26/ALT 60-improved since admit  CT abdomen pelvis yesterday if numerous gallstones, and choledocholithiasis with dilated biliary tree  Patient has no ongoing complaints of abdominal pain, has not had any fever here, no nausea or vomiting.,  Some thickening in the area of the pancreatic head with a small cystic area and narrowing in the distal common bile duct at the level of the ampulla some mucosal thickening, recommend further evaluation to rule out ampullary lesion, surgical changes of subtotal colectomy    Objective   Vital signs in last 24 hours: Temp:  [97.8 F (36.6 C)-98.7 F (37.1 C)] 98.4 F (36.9 C) (07/20 1200) Pulse Rate:  [55-76] 60 (07/20 1200) Resp:  [16-17] 17 (07/20 0403) BP: (126-158)/(65-81) 158/78 (07/20 1200) SpO2:  [99 %-100 %] 100 % (07/20 1200) Last BM Date : 10/03/23 General:    Older white female in NAD Heart:  Regular rate and rhythm; no murmurs Lungs: Respirations even and unlabored, lungs CTA bilaterally Abdomen:  Soft, minimally tender epigastrium. Normal bowel sounds. Extremities:  Without edema. Neurologic:  Alert and oriented,  grossly normal neurologically. Psych:  Cooperative. Normal mood and affect.  Intake/Output from previous day: No intake/output data recorded. Intake/Output this shift: No  intake/output data recorded.  Lab Results: Recent Labs    10/03/23 0233 10/04/23 0241 10/05/23 0300  WBC 6.9 8.9 8.0  HGB 11.4* 11.7* 11.0*  HCT 35.7* 36.8 34.4*  PLT 180 210 187   BMET Recent Labs    10/03/23 0233 10/04/23 0241 10/05/23 0300  NA 139 138 140  K 4.2 4.3 4.1  CL 107 107 109  CO2 23 20* 20*  GLUCOSE 125* 128* 105*  BUN 21 19 22   CREATININE 0.94 0.84 0.89  CALCIUM 8.6* 8.9 8.7*   LFT Recent Labs    10/05/23 0300  PROT 5.7*  ALBUMIN 2.7*  AST 26  ALT 60*  ALKPHOS 162*  BILITOT 0.4   PT/INR No results for input(s): LABPROT, INR in the last 72 hours.  Studies/Results: CT ABDOMEN PELVIS W CONTRAST Result Date: 10/04/2023 CLINICAL DATA:  Duodenal ulcer. Lynch syndrome. Ascending colon cancer history. EXAM: CT ABDOMEN AND PELVIS WITH CONTRAST TECHNIQUE: Multidetector CT imaging of the abdomen and pelvis was performed using the standard protocol following bolus administration of intravenous contrast. RADIATION DOSE REDUCTION: This exam was performed according to the departmental dose-optimization program which includes automated exposure control, adjustment of the mA and/or kV according to patient size and/or use of iterative reconstruction technique. CONTRAST:  75mL OMNIPAQUE  IOHEXOL  350 MG/ML SOLN COMPARISON:  CT 10/30/2022. MRI abdomen 10/01/2023. Abdominal ultrasound 09/23/2023 FINDINGS: Lower chest: There is some linear opacity lung bases likely scar or atelectasis. Areas of bronchiectasis along the medial right lower lobe, unchanged from previous. Some tree-in-bud reticulonodular changes identified along the extreme inferior aspect of the middle lobe, nonspecific. Small calcification left  lower lobe image 45 of series 5. Hepatobiliary: Dilated biliary tree identified. Increased from the study of 2024. On recent MRI the dilatation with similar. Several filling defects in the distal common duct on the prior MRI. There are several seen today but there seen on  the previous MRCP. Dilated gallbladder as well with numerous stones. Please correlate with history. Once again there is some mucosal thickening and enhancement along the distal common duct with common abrupt taper appearance at the ampulla. No space-occupying liver lesion. Patent portal vein. Pancreas: No pancreatic ductal dilatation or pancreatic atrophy. Small cystic area identified adjacent to the common duct is stable on image 35. Please correlate with prior MRCP. Spleen: Normal in size without focal abnormality. Adrenals/Urinary Tract: Adrenal glands are preserved. Small Bosniak 1 right-sided anterior renal cyst as seen on previous examinations. No enhancing right-sided renal mass. Left kidney has focal areas of severe atrophy with calcifications towards the lower pole and distortion. Adjacent perinephric fat stranding and thickening. Please correlate with known history and any prior intervention. Change were seen on the study of 2024 as well. Stomach/Bowel: Surgical changes of subtotal colectomy with primary anastomosis in the left hemipelvis. Oral contrast was administered. Overall the bowel is nondilated. No abnormal soft tissue at the surgical anastomosis. Scattered clips throughout the mesentery as well. Vascular/Lymphatic: Normal caliber aorta and IVC. Scattered vascular calcifications. Several enlarged retroperitoneal and retrocrural nodes identified. Example left periaortic on image 33 of series 3 measures 2.5 x 1.5 cm. In August 2024 this same node measured 2.3 by 1.4 cm. Additional nodes included retrocaval on image 35 today measuring 2.9 by 1.5 cm and going back to August 2024 2.7 x 1.4 cm. Other nodes are similar as well. Reproductive: Status post hysterectomy. No adnexal masses. Other: No free air or free fluid. Musculoskeletal: Scattered degenerative changes. Curvature of the spine IMPRESSION: Again there are numerous gallstones and evidence of choledocholithiasis with a dilated biliary tree  similar to the recent MRI. There is some thickening in the area of the pancreatic head with a small cystic area and narrowing of the distal common duct at the level of the ampulla with some mucosal thickening. Recommend further evaluation to exclude an ampullary lesion when clinically appropriate. Surgical changes of subtotal colectomy. Stable multifocal atrophy of the left kidney with distortion and stones. Stable numerous enlarged lymph nodes in the retroperitoneum and retrocrural spaces going back to August 2024. Please correlate for known history. Electronically Signed   By: Ranell Bring M.D.   On: 10/04/2023 14:39       Assessment / Plan:    #19 73 year old white female with history of Lynch syndrome status post colon cancer x 2, status post subtotal colectomy, history of renal cell CA and non-Hodgkin's lymphoma  #2 intermittent episodes of epigastric pain and nausea since May 2025, patient had fever earlier this week just prior to admission, she reports a few episodes of fever with chills with some of the episodes in May.  Workup has shown cholelithiasis and choledocholithiasis, and also finding of large ulcer in the second portion of the duodenum in the area of the major papilla precluding ability to complete ERCP   Ulcer was biopsied, path is pending She had also been taking NSAIDs recently, treating with twice daily PPI, and H. pylori stool antigen pending  There is some concern for ampullary lesion on CT from yesterday,  Plan Continue regular diet Oral PPI twice daily Await H. pylori stool antigen, CEA, CA 19-9 and biopsy of  duodenal ulcer  We were able to be in touch with the biliary team at [facility] yesterday, and they do feel that this patient is appropriate for day trip there which hopefully will be arranged for Tuesday or Wednesday of this week for potential EUS guided biliary drainage and/or repeat attempt at traditional ERCP. Patient is in agreement with this plan, will need to be  in contact with the GI fellow tomorrow.  Her information is listed on my note from yesterday.  Principal Problem:   Common bile duct obstruction Active Problems:   Lynch syndrome   Renal cell carcinoma (HCC)   NHL (non-Hodgkin's lymphoma) (HCC)   Obesity   B12 deficiency   History of colon cancer   Renal stones   Choledocholithiasis   Common bile duct dilatation   Duodenal ulcer     LOS: 4 days   Hakeem Frazzini EsterwoodPA-C  10/05/2023, 12:27 PM

## 2023-10-06 DIAGNOSIS — R509 Fever, unspecified: Secondary | ICD-10-CM | POA: Diagnosis not present

## 2023-10-06 DIAGNOSIS — R748 Abnormal levels of other serum enzymes: Secondary | ICD-10-CM | POA: Diagnosis not present

## 2023-10-06 DIAGNOSIS — N390 Urinary tract infection, site not specified: Secondary | ICD-10-CM | POA: Diagnosis not present

## 2023-10-06 DIAGNOSIS — K8050 Calculus of bile duct without cholangitis or cholecystitis without obstruction: Secondary | ICD-10-CM | POA: Diagnosis not present

## 2023-10-06 LAB — COMPREHENSIVE METABOLIC PANEL WITH GFR
ALT: 64 U/L — ABNORMAL HIGH (ref 0–44)
AST: 43 U/L — ABNORMAL HIGH (ref 15–41)
Albumin: 2.7 g/dL — ABNORMAL LOW (ref 3.5–5.0)
Alkaline Phosphatase: 165 U/L — ABNORMAL HIGH (ref 38–126)
Anion gap: 8 (ref 5–15)
BUN: 21 mg/dL (ref 8–23)
CO2: 24 mmol/L (ref 22–32)
Calcium: 9.2 mg/dL (ref 8.9–10.3)
Chloride: 108 mmol/L (ref 98–111)
Creatinine, Ser: 1.1 mg/dL — ABNORMAL HIGH (ref 0.44–1.00)
GFR, Estimated: 53 mL/min — ABNORMAL LOW (ref >60)
Glucose, Bld: 113 mg/dL — ABNORMAL HIGH (ref 70–99)
Potassium: 4.6 mmol/L (ref 3.5–5.1)
Sodium: 140 mmol/L (ref 135–145)
Total Bilirubin: 0.4 mg/dL (ref 0.0–1.2)
Total Protein: 5.7 g/dL — ABNORMAL LOW (ref 6.5–8.1)

## 2023-10-06 MED ORDER — TRAMADOL HCL 50 MG PO TABS: 50.0000 mg | ORAL_TABLET | Freq: Three times a day (TID) | ORAL | Status: DC | PRN

## 2023-10-06 MED ORDER — POTASSIUM CITRATE ER 10 MEQ (1080 MG) PO TBCR
10.0000 meq | EXTENDED_RELEASE_TABLET | Freq: Three times a day (TID) | ORAL | Status: DC
  Administered 2023-10-06 – 2023-10-08 (×7): 10 meq via ORAL
  Filled 2023-10-06 (×9): qty 1

## 2023-10-06 MED ORDER — MAGNESIUM OXIDE -MG SUPPLEMENT 400 (240 MG) MG PO TABS
400.0000 mg | ORAL_TABLET | Freq: Every day | ORAL | Status: DC
Start: 2023-10-06 — End: 2023-10-08
  Administered 2023-10-06 – 2023-10-08 (×3): 400 mg via ORAL
  Filled 2023-10-06 (×4): qty 1

## 2023-10-06 MED ORDER — CEPHALEXIN 500 MG PO CAPS
500.0000 mg | ORAL_CAPSULE | Freq: Three times a day (TID) | ORAL | Status: DC
  Administered 2023-10-06 – 2023-10-08 (×6): 500 mg via ORAL
  Filled 2023-10-06 (×6): qty 1

## 2023-10-06 MED ORDER — METRONIDAZOLE 500 MG PO TABS
500.0000 mg | ORAL_TABLET | Freq: Two times a day (BID) | ORAL | Status: DC
  Administered 2023-10-06 – 2023-10-08 (×5): 500 mg via ORAL
  Filled 2023-10-06 (×5): qty 1

## 2023-10-06 NOTE — Progress Notes (Signed)
 [facility]                                                                              Nicole [name], is a 73 y.o. female, DOB - 01-15-1951, FMW:[id number] Admit date - 10/01/2023    Outpatient Primary MD for the patient is [name], [name], FNP  LOS - 5  days  Chief Complaint  Patient presents with   Abdominal Pain       Brief summary   Patient is a 73 year old female with history of Lynch syndrome, colon CA x2 s/p resection, renal cancer, skin CA, non-Hodkin's lymphoma, B12 deficiency who presented to the ED from GI office due to epigastric pain.  Initial work up on 7/8 with ultrasound revealed a dilated CBD (1.7cm) with intrahepatic dilatation and increased hepatic echogenicity. MRCP was recommended. When she presented to GI clinic on 7/16, she reported intermittent fevers as well.  She was sent to the ED where she has been hypertensive and afebrile (Tmax 60F). WBC normal, TBili 0.6, alkaline phosphatase 391, AST 79, ALT 176. MRCP showed a distended gallbladder with numerous stones, severe intra- and extra-hepatic ductal dilatation with CBD measuring 1.9cm with dependent gallstones. There appears to be tapering to the ampulla without a stone so ?stricture vs. occult mass.  Patient was transferred to [facility] for ERCP.   Assessment & Plan    Principal Problem:   Common bile duct obstruction, choledocholithiasis, early cholangitis - Continue IV Zosyn  - EGD 7/18 showed large periampullary ulcer, biopsies taken concern about duodenal versus ampullary cancer with Lynch syndrome however she has been taking high amount of ibuprofen  with no PPI.  IR versus attempt to get EUS guided bile duct access, not urgent.  ERCP unsuccessful - CA 19-9, CEA pending, LFTs improving - Continue PPI twice daily  - CT abdomen showed numerous gallstones and evidence of choledocholithiasis with dilated biliary tree.  Some thickening in the area of the pancreatic head with small cystic area and  narrowing of the distal common duct at the level of the ampulla, recommend further evaluation to exclude ampullary lesion - GI arranging day trip to [facility] for advanced endoscopic biliary drainage on 7/22 or 7/23  Active Problems: E. coli urinary tract infection - Urine culture with more than 100,000 colonies of E. coli - Sensitivities reviewed, placed on Keflex , added Flagyl  to provide coverage for early cholangitis    Lynch syndrome, history of colon CA x2 -Continue outpatient follow-up -  Follows outpatient with Dr. Dorsey (oncology) and GI, see #1     Renal cell carcinoma (HCC),  - Has history of left partial nephrectomy in 03/2005, pathology revealed stage T1a clear-cell. She had recurrent renal cell CA in 2020 and was treated with cryoablation on 05/20/2018  - has been following urology outpatient    NHL (non-Hodgkin's lymphoma) [facility]) -Follows Dr. Rogers, per last note, retroperitoneal lymphadenopathy stable, no B symptoms.    B12 deficiency - Continue B12 injections every 3 months  History of nephrolithiasis - Resume potassium citrate  per patient's request   Obesity class II Estimated body mass index is 36.46 kg/m as calculated from the following:  Height as of this encounter: 5' 0.5 (1.537 m).   Weight as of this encounter: 86.1 kg.  Code Status: Full code DVT Prophylaxis:  Place and maintain sequential compression device Start: 10/02/23 1019   Level of Care: Level of care: Med-Surg Family Communication: Updated patient' Disposition Plan:      Remains inpatient appropriate:   Pending GI recommendations   Procedures:  EGD  Consultants:   Gastroenterology  Antimicrobials:   Anti-infectives (From admission, onward)    Start     Dose/Rate Route Frequency Ordered Stop   10/06/23 1400  cephALEXin  (KEFLEX ) capsule 500 mg        500 mg Oral Every 8 hours 10/06/23 1026     10/06/23 1130  metroNIDAZOLE  (FLAGYL ) tablet 500 mg        500 mg Oral Every  12 hours 10/06/23 1026     10/05/23 1430  amoxicillin -clavulanate (AUGMENTIN ) 875-125 MG per tablet 1 tablet  Status:  Discontinued        1 tablet Oral Every 12 hours 10/05/23 1343 10/06/23 1026   10/01/23 2200  piperacillin -tazobactam (ZOSYN ) IVPB 3.375 g  Status:  Discontinued        3.375 g 12.5 mL/hr over 240 Minutes Intravenous Every 8 hours 10/01/23 1844 10/05/23 1343   10/01/23 1630  piperacillin -tazobactam (ZOSYN ) IVPB 3.375 g        3.375 g 100 mL/hr over 30 Minutes Intravenous  Once 10/01/23 1617 10/01/23 1701          Medications  cephALEXin   500 mg Oral Q8H   diclofenac   100 mg Rectal Once   magnesium  oxide  400 mg Oral Daily   metroNIDAZOLE   500 mg Oral Q12H   pantoprazole   40 mg Oral BID AC   potassium citrate   10 mEq Oral TID      Subjective:   Nicole [name] was seen and examined today.  No acute complaints, no fever chills, chest pain, abdominal pain, nausea or vomiting.    Objective:   Vitals:   10/05/23 1200 10/05/23 1618 10/05/23 2045 10/06/23 0741  BP: (!) 158/78 121/60 (!) 132/58 121/73  Pulse: 60 (!) 57 85 61  Resp:   18   Temp: 98.4 F (36.9 C) 98.1 F (36.7 C) 97.7 F (36.5 C) 98.1 F (36.7 C)  TempSrc:  Oral Oral Oral  SpO2: 100% 100% 94% 99%  Weight:      Height:       No intake or output data in the 24 hours ending 10/06/23 1105    Wt Readings from Last 3 Encounters:  10/01/23 86.1 kg  10/01/23 86.1 kg  07/30/23 87.1 kg   Physical Exam General: Alert and oriented x 3, NAD Cardiovascular: S1 S2 clear, RRR.  Respiratory: CTAB, no wheezing Gastrointestinal: Soft, nontender, nondistended, NBS Ext: no pedal edema bilaterally Neuro: no new deficits Psych: Normal affect       Data Reviewed:  I have personally reviewed following labs    CBC Lab Results  Component Value Date   WBC 8.0 10/05/2023   RBC 3.86 (L) 10/05/2023   HGB 11.0 (L) 10/05/2023   HCT 34.4 (L) 10/05/2023   MCV 89.1 10/05/2023   MCH 28.5 10/05/2023    PLT 187 10/05/2023   MCHC 32.0 10/05/2023   RDW 15.1 10/05/2023   LYMPHSABS 0.9 10/01/2023   MONOABS 1.1 (H) 10/01/2023   EOSABS 0.1 10/01/2023   BASOSABS 0.1 10/01/2023     Last metabolic panel Lab Results  Component Value Date  NA 140 10/06/2023   K 4.6 10/06/2023   CL 108 10/06/2023   CO2 24 10/06/2023   BUN 21 10/06/2023   CREATININE 1.10 (H) 10/06/2023   GLUCOSE 113 (H) 10/06/2023   GFRNONAA 53 (L) 10/06/2023   GFRAA >60 09/16/2019   CALCIUM 9.2 10/06/2023   PROT 5.7 (L) 10/06/2023   ALBUMIN 2.7 (L) 10/06/2023   BILITOT 0.4 10/06/2023   ALKPHOS 165 (H) 10/06/2023   AST 43 (H) 10/06/2023   ALT 64 (H) 10/06/2023   ANIONGAP 8 10/06/2023    CBG (last 3)  No results for input(s): GLUCAP in the last 72 hours.    Coagulation Profile: Recent Labs  Lab 10/02/23 1137  INR 1.1     Radiology Studies: I have personally reviewed the imaging studies  CT ABDOMEN PELVIS W CONTRAST Result Date: 10/04/2023 CLINICAL DATA:  Duodenal ulcer. Lynch syndrome. Ascending colon cancer history. EXAM: CT ABDOMEN AND PELVIS WITH CONTRAST TECHNIQUE: Multidetector CT imaging of the abdomen and pelvis was performed using the standard protocol following bolus administration of intravenous contrast. RADIATION DOSE REDUCTION: This exam was performed according to the departmental dose-optimization program which includes automated exposure control, adjustment of the mA and/or kV according to patient size and/or use of iterative reconstruction technique. CONTRAST:  75mL OMNIPAQUE  IOHEXOL  350 MG/ML SOLN COMPARISON:  CT 10/30/2022. MRI abdomen 10/01/2023. Abdominal ultrasound 09/23/2023 FINDINGS: Lower chest: There is some linear opacity lung bases likely scar or atelectasis. Areas of bronchiectasis along the medial right lower lobe, unchanged from previous. Some tree-in-bud reticulonodular changes identified along the extreme inferior aspect of the middle lobe, nonspecific. Small calcification left  lower lobe image 45 of series 5. Hepatobiliary: Dilated biliary tree identified. Increased from the study of 2024. On recent MRI the dilatation with similar. Several filling defects in the distal common duct on the prior MRI. There are several seen today but there seen on the previous MRCP. Dilated gallbladder as well with numerous stones. Please correlate with history. Once again there is some mucosal thickening and enhancement along the distal common duct with common abrupt taper appearance at the ampulla. No space-occupying liver lesion. Patent portal vein. Pancreas: No pancreatic ductal dilatation or pancreatic atrophy. Small cystic area identified adjacent to the common duct is stable on image 35. Please correlate with prior MRCP. Spleen: Normal in size without focal abnormality. Adrenals/Urinary Tract: Adrenal glands are preserved. Small Bosniak 1 right-sided anterior renal cyst as seen on previous examinations. No enhancing right-sided renal mass. Left kidney has focal areas of severe atrophy with calcifications towards the lower pole and distortion. Adjacent perinephric fat stranding and thickening. Please correlate with known history and any prior intervention. Change were seen on the study of 2024 as well. Stomach/Bowel: Surgical changes of subtotal colectomy with primary anastomosis in the left hemipelvis. Oral contrast was administered. Overall the bowel is nondilated. No abnormal soft tissue at the surgical anastomosis. Scattered clips throughout the mesentery as well. Vascular/Lymphatic: Normal caliber aorta and IVC. Scattered vascular calcifications. Several enlarged retroperitoneal and retrocrural nodes identified. Example left periaortic on image 33 of series 3 measures 2.5 x 1.5 cm. In August 2024 this same node measured 2.3 by 1.4 cm. Additional nodes included retrocaval on image 35 today measuring 2.9 by 1.5 cm and going back to August 2024 2.7 x 1.4 cm. Other nodes are similar as well.  Reproductive: Status post hysterectomy. No adnexal masses. Other: No free air or free fluid. Musculoskeletal: Scattered degenerative changes. Curvature of the spine IMPRESSION: Again there  are numerous gallstones and evidence of choledocholithiasis with a dilated biliary tree similar to the recent MRI. There is some thickening in the area of the pancreatic head with a small cystic area and narrowing of the distal common duct at the level of the ampulla with some mucosal thickening. Recommend further evaluation to exclude an ampullary lesion when clinically appropriate. Surgical changes of subtotal colectomy. Stable multifocal atrophy of the left kidney with distortion and stones. Stable numerous enlarged lymph nodes in the retroperitoneum and retrocrural spaces going back to August 2024. Please correlate for known history. Electronically Signed   By: Ranell Bring M.D.   On: 10/04/2023 14:39        Joli Koob M.D. [facility] 10/06/2023, 11:05 AM  Available via Epic secure chat 7am-7pm After 7 pm, please refer to night coverage provider listed on amion.

## 2023-10-06 NOTE — Plan of Care (Signed)

## 2023-10-07 ENCOUNTER — Inpatient Hospital Stay (HOSPITAL_COMMUNITY)

## 2023-10-07 ENCOUNTER — Ambulatory Visit: Payer: Self-pay | Admitting: Internal Medicine

## 2023-10-07 DIAGNOSIS — R918 Other nonspecific abnormal finding of lung field: Secondary | ICD-10-CM | POA: Diagnosis not present

## 2023-10-07 DIAGNOSIS — K831 Obstruction of bile duct: Secondary | ICD-10-CM | POA: Diagnosis not present

## 2023-10-07 DIAGNOSIS — K8050 Calculus of bile duct without cholangitis or cholecystitis without obstruction: Secondary | ICD-10-CM | POA: Diagnosis not present

## 2023-10-07 DIAGNOSIS — I700 Atherosclerosis of aorta: Secondary | ICD-10-CM | POA: Diagnosis not present

## 2023-10-07 DIAGNOSIS — R509 Fever, unspecified: Secondary | ICD-10-CM | POA: Diagnosis not present

## 2023-10-07 DIAGNOSIS — R748 Abnormal levels of other serum enzymes: Secondary | ICD-10-CM | POA: Diagnosis not present

## 2023-10-07 DIAGNOSIS — C179 Malignant neoplasm of small intestine, unspecified: Secondary | ICD-10-CM | POA: Diagnosis not present

## 2023-10-07 DIAGNOSIS — R590 Localized enlarged lymph nodes: Secondary | ICD-10-CM | POA: Diagnosis not present

## 2023-10-07 DIAGNOSIS — C170 Malignant neoplasm of duodenum: Secondary | ICD-10-CM

## 2023-10-07 LAB — COMPREHENSIVE METABOLIC PANEL WITH GFR
ALT: 67 U/L — ABNORMAL HIGH (ref 0–44)
AST: 39 U/L (ref 15–41)
Albumin: 2.8 g/dL — ABNORMAL LOW (ref 3.5–5.0)
Alkaline Phosphatase: 187 U/L — ABNORMAL HIGH (ref 38–126)
Anion gap: 9 (ref 5–15)
BUN: 20 mg/dL (ref 8–23)
CO2: 25 mmol/L (ref 22–32)
Calcium: 9.1 mg/dL (ref 8.9–10.3)
Chloride: 105 mmol/L (ref 98–111)
Creatinine, Ser: 0.93 mg/dL (ref 0.44–1.00)
GFR, Estimated: >60 mL/min (ref >60)
Glucose, Bld: 111 mg/dL — ABNORMAL HIGH (ref 70–99)
Potassium: 4.5 mmol/L (ref 3.5–5.1)
Sodium: 139 mmol/L (ref 135–145)
Total Bilirubin: 0.5 mg/dL (ref 0.0–1.2)
Total Protein: 5.7 g/dL — ABNORMAL LOW (ref 6.5–8.1)

## 2023-10-07 LAB — CBC
HCT: 36.9 % (ref 36.0–46.0)
Hemoglobin: 11.9 g/dL — ABNORMAL LOW (ref 12.0–15.0)
MCH: 28.5 pg (ref 26.0–34.0)
MCHC: 32.2 g/dL (ref 30.0–36.0)
MCV: 88.3 fL (ref 80.0–100.0)
Platelets: 180 K/uL (ref 150–400)
RBC: 4.18 MIL/uL (ref 3.87–5.11)
RDW: 14.7 % (ref 11.5–15.5)
WBC: 6.1 K/uL (ref 4.0–10.5)
nRBC: 0 % (ref 0.0–0.2)

## 2023-10-07 LAB — CANCER ANTIGEN 19-9: CA 19-9: 13 U/mL (ref 0–35)

## 2023-10-07 LAB — CEA: CEA: 2.4 ng/mL (ref 0.0–4.7)

## 2023-10-07 MED ORDER — SENNOSIDES-DOCUSATE SODIUM 8.6-50 MG PO TABS
1.0000 | ORAL_TABLET | Freq: Two times a day (BID) | ORAL | Status: DC
  Filled 2023-10-07 (×3): qty 1

## 2023-10-07 MED ORDER — IOHEXOL 350 MG/ML SOLN
75.0000 mL | Freq: Once | INTRAVENOUS | Status: AC | PRN
  Administered 2023-10-07: 75 mL via INTRAVENOUS

## 2023-10-07 MED ORDER — POLYETHYLENE GLYCOL 3350 17 G PO PACK
17.0000 g | PACK | Freq: Two times a day (BID) | ORAL | Status: DC
  Filled 2023-10-07 (×3): qty 1

## 2023-10-07 NOTE — Plan of Care (Signed)

## 2023-10-07 NOTE — Consult Note (Addendum)
 Nicole [name] 1951/03/05  [id number].    Requesting Provider: Vina Dasen, NP  Chief Complaint/Reason for Consult: CBD stone, duodenal ulcer with mild abdominal discomfort  HPI: Nicole [name] is a 73 y.o. female with PMHx significant for Lynch Syndrome, colon CA, non-Hodgekin's lymphoma who presents to [facility] hospital after being transferred from [facility] for MRCP after suspicion of a CBD on US . Patient stated that she has been having abdominal discomfort intermittently since the beginning of the year but has reached its worst in May which prompted her to seek medical attention. She stated that she chronically takes ibuprofen  for arthritis. Upon arrival to [facility], MRCP was done which was positive for multiple gallstones and biliary dilation. An ERCP attempt was made but was unsuccessful d/t D2 ulcer with major papilla. A biopsy was done an results suspicious for adenocarcinoma. [facility] was contacted for possible EUS guided biliary drainage and/or repeat attempt at traditional ERCP.     Past Medical History: As below Prior Abdominal Surgeries: 3 bowel resections  Blood Thinners: Denies Last Colonoscopy: At least 3 years ago per patient report  Tobacco Use: Denies  Alcohol Use: 1 glass per year Substance use: Denies   ROS: Review of Systems  Constitutional:  Negative for chills, fever and weight loss.  Gastrointestinal:  Positive for abdominal pain. Negative for constipation, diarrhea, nausea and vomiting.    Family History  Problem Relation Age of Onset   Cancer Son    Early death Son    Cancer Mother    Cancer Maternal Grandfather     Past Medical History:  Diagnosis Date   Allergy ?   Anemia    Arthritis    shorulder   B12 deficiency    Chronic diarrhea SECONDARY HX COLON CANCER   History of colon cancer [facility] SYNDROME ---  COLORECTAL CANCER S/P COLECTOMY X3  LAST ONE 2007   NO RECURRENCE   History of colon cancer 05/04/2013   History of kidney infection 08/2013    History of kidney stones    History of MRSA infection 08/2013   left leg   Hx of osteopenia    Hyperlipidemia ?   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   NHL (non-Hodgkin's lymphoma) (HCC) 10/17/2010   MONITORED BY DR PERNELL   Personal history of renal cancer S/P LEFT PARTIAL NEPHRECTOMY 2007--  NO RECURRENCE   DUE TO KIDNEY CANCER   Right shoulder pain S/P ROTATOR CUFF REPAIR   Skin cancer     Past Surgical History:  Procedure Laterality Date   ABDOMINAL HYSTERECTOMY     APPENDECTOMY  1993   W/ LAVH   COLON SURGERY  2001,2002,2007   COLONOSCOPY W/ POLYPECTOMY     CYSTOSCOPY W/ RETROGRADES  07/14/2014   Procedure: CYSTOSCOPY WITH RETROGRADE PYELOGRAM;  Surgeon: Garnette Shack, MD;  Location: [facility];  Service: Urology;;   CYSTOSCOPY W/ URETERAL STENT REMOVAL Left 07/14/2014   Procedure: CYSTOSCOPY WITH STENT REMOVAL;  Surgeon: Garnette Shack, MD;  Location: [facility];  Service: Urology;  Laterality: Left;   CYSTOSCOPY WITH RETROGRADE PYELOGRAM, URETEROSCOPY AND STENT PLACEMENT Left 11/30/2020   Procedure: CYSTOSCOPY WITH RETROGRADE PYELOGRAM, URETEROSCOPY/STONE EXTRACTION AND STENT PLACEMENT;  Surgeon: Shack Garnette, MD;  Location: [facility];  Service: Urology;  Laterality: Left;   CYSTOSCOPY WITH RETROGRADE PYELOGRAM, URETEROSCOPY AND STENT PLACEMENT Left 06/24/2022   Procedure: CYSTOSCOPY WITH RETROGRADE PYELOGRAM, URETEROSCOPY AND STENT PLACEMENT;  Surgeon: Matilda Senior, MD;  Location: [facility];  Service: Urology;  Laterality: Left;   CYSTOSCOPY WITH STENT PLACEMENT  02/07/2012   Procedure: CYSTOSCOPY WITH STENT PLACEMENT;  Surgeon: Senior Matilda, MD;  Location[facility];  Service: Urology;  Laterality: Left;   CYSTOSCOPY WITH STENT PLACEMENT Left 07/14/2014   Procedure: CYSTOSCOPY WITH STENT PLACEMENT;  Surgeon:  Senior Matilda, MD;  Location: [facility];  Service: Urology;  Laterality: Left;   ESOPHAGOGASTRODUODENOSCOPY N/A 10/03/2023   Procedure: EGD (ESOPHAGOGASTRODUODENOSCOPY);  Surgeon: Avram Lupita BRAVO, MD;  Location: [facility];  Service: Gastroenterology;  Laterality: N/A;  ercp scheduled, but unable to be done due to large ulcer   EYE SURGERY  ?   Removal of styes   FLEXIBLE SIGMOIDOSCOPY  08/29/2011   Procedure: FLEXIBLE SIGMOIDOSCOPY;  Surgeon: Claudis RAYMOND Rivet, MD;  [facility];  Service: Endoscopy;  Laterality: N/A;  930   FLEXIBLE SIGMOIDOSCOPY N/A 09/28/2012   Procedure: FLEXIBLE SIGMOIDOSCOPY;  Surgeon: Claudis RAYMOND Rivet, MD;  Location: [facility];  Service: Endoscopy;  Laterality: N/A;  730   FLEXIBLE SIGMOIDOSCOPY N/A 07/08/2014   Procedure: FLEXIBLE SIGMOIDOSCOPY;  Surgeon: Claudis RAYMOND Rivet, MD;  [facility]: [facility];  Service: Endoscopy;  Laterality: N/A;  830 - moved to 10:20 - Ann to notify pt   FLEXIBLE SIGMOIDOSCOPY N/A 08/11/2015   Procedure: FLEXIBLE SIGMOIDOSCOPY;  Surgeon: Claudis RAYMOND Rivet, MD;  [facility];  Service: Endoscopy;  Laterality: N/A;  730   FLEXIBLE SIGMOIDOSCOPY N/A 08/13/2016   Procedure: FLEXIBLE SIGMOIDOSCOPY;  Surgeon: Rivet Claudis RAYMOND, MD;  [facility];  Service: Endoscopy;  Laterality: N/A;  730   FLEXIBLE SIGMOIDOSCOPY N/A 10/06/2019   Procedure: FLEXIBLE SIGMOIDOSCOPY;  Surgeon: Rivet Claudis RAYMOND, MD;  [facility];  Service: Endoscopy;  Laterality: N/A;  1245   HEMICOLECTOMY  2001   right   HEMICOLECTOMY  2002   left   HOLMIUM LASER APPLICATION Left 07/14/2014   Procedure: HOLMIUM LASER LITHOTRIPSY,;  Surgeon: Senior Matilda, MD;  Location: [facility];  Service: Urology;  Laterality: Left;   HOLMIUM LASER APPLICATION Left 11/30/2020   Procedure: HOLMIUM LASER APPLICATION;  Surgeon: Matilda Senior, MD;  Location: [facility];  Service: Urology;   Laterality: Left;   HOLMIUM LASER APPLICATION Left 06/24/2022   Procedure: HOLMIUM LASER APPLICATION;  Surgeon: Matilda Senior, MD;  Location: [facility];  Service: Urology;  Laterality: Left;   IR RADIOLOGIST EVAL & MGMT  04/29/2018   IR RADIOLOGIST EVAL & MGMT  10/07/2019   IR RADIOLOGIST EVAL & MGMT  10/11/2020   IR RADIOLOGIST EVAL & MGMT  11/08/2021   kidney resection  2007   left partial   LAPAROSCOPIC ASSISTED VAGINAL HYSTERECTOMY  1993   W/ BILATERAL SALPINGO-OOPHORECTOMY   LEFT FLANK EXPLORATION W/  PARTIAL LEFT NEPHRECTOMY/ LEFT HILAR LYMPH NODE BX  04-17-2005  DR DAHLSTEDT   RENAL CELL CARCINOMA   LITHOTRIPSY  11-22013   x2   NEPHROLITHOTOMY Left 06/09/2014   Procedure: NEPHROLITHOTOMY PERCUTANEOUS;  Surgeon: Senior Matilda, MD;  Location: [facility];  Service: Urology;  Laterality: Left;  with STENT   RADIOLOGY WITH ANESTHESIA Left 05/20/2018   Procedure: CT WITH ANESTHESIA LEFT RENAL CRYOABLATION AND BIOPSY;  Surgeon: Vanice Sharper, MD;  Location: [facility];  Service: Anesthesiology;  Laterality: Left;   RIGHT ROTATOR  CUFF REPAIR/   BICEP REPAIR  10-17-2011  DR SUPPLE   SUBTOTAL COLECTOMY  2007   URETEROSCOPY WITH HOLMIUM LASER LITHOTRIPSY Left 07/14/2014   Procedure: URETEROSCOPY  EXTRACTION OF STONES;  Surgeon: Garnette Shack, MD;  Location: [facility];  Service: Urology;  Laterality: Left;    Social History:  reports that she has never smoked. She has never used smokeless tobacco. She reports that she does not currently use alcohol. She reports that she does not use drugs.  Allergies:  Allergies  Allergen Reactions   Latex Rash   Morphine And Codeine Other (See Comments)    Hallucinations    Tape Rash    Certain tapes     Medications Prior to Admission  Medication Sig Dispense Refill   acetaminophen  (TYLENOL ) 500 MG tablet Take 1,000 mg by mouth every 8 (eight) hours as needed for fever.     BIOTIN  PO Take 1 tablet by mouth  daily.     Cholecalciferol  (VITAMIN D3) 125 MCG (5000 UT) CAPS Take 5,000 Units by mouth daily.     cyanocobalamin  (VITAMIN B12) 1000 MCG/ML injection ADMINISTER 1 ML(1000 MCG) IN THE MUSCLE EVERY 21 DAYS 1 mL 17   diphenoxylate -atropine  (LOMOTIL ) 2.5-0.025 MG tablet Take 1 tablet by mouth 4 (four) times daily as needed for diarrhea or loose stools. 60 tablet 6   furosemide  (LASIX ) 40 MG tablet Take 40 mg by mouth daily as needed for fluid.      ibuprofen  (ADVIL ,MOTRIN ) 800 MG tablet Take 800 mg by mouth 3 (three) times daily as needed for moderate pain.  11   magnesium  oxide (MAG-OX) 400 (240 Mg) MG tablet Take 400 mg by mouth daily.     Potassium Citrate  15 MEQ (1620 MG) TBCR Take 1 tablet by mouth 3 (three) times daily. 255 tablet 1     Physical Exam: Blood pressure 132/76, pulse 72, temperature (!) 97.5 F (36.4 C), resp. rate 17, height 5' 0.5 (1.537 m), weight 86.1 kg, SpO2 100%.  Constitutional:      General: She is not in acute distress.    Appearance: She is well-developed. She is not ill-appearing.  Cardiovascular:     Rate and Rhythm: Normal rate.  Pulmonary:     Effort: Pulmonary effort is normal.  Abdominal:     General: Abdomen is protuberant.     Palpations: Abdomen is soft.     Tenderness: There is no abdominal tenderness. There is no guarding or rebound.     Comments: Patient has little to no TTP on exam and is not peritonitic on exam.   Skin:    General: Skin is warm and dry.  Neurological:     General: No focal deficit present.     Mental Status: She is alert and oriented to person, place, and time.   Results for orders placed or performed during the hospital encounter of 10/01/23 (from the past 48 hours)  Comprehensive metabolic panel     Status: Abnormal   Collection Time: 10/06/23  3:58 AM  Result Value Ref Range   Sodium 140 135 - 145 mmol/L   Potassium 4.6 3.5 - 5.1 mmol/L   Chloride 108 98 - 111 mmol/L   CO2 24 22 - 32 mmol/L   Glucose, Bld 113 (H)  70 - 99 mg/dL    Comment: Glucose reference range applies only to samples taken after fasting for at least 8 hours.   BUN 21 8 - 23 mg/dL   Creatinine, Ser 8.89 (H) 0.44 - 1.00 mg/dL   Calcium 9.2 8.9 - 89.6 mg/dL   Total  Protein 5.7 (L) 6.5 - 8.1 g/dL   Albumin 2.7 (L) 3.5 - 5.0 g/dL   AST 43 (H) 15 - 41 U/L   ALT 64 (H) 0 - 44 U/L   Alkaline Phosphatase 165 (H) 38 - 126 U/L   Total Bilirubin 0.4 0.0 - 1.2 mg/dL   GFR, Estimated 53 (L) >60 mL/min    Comment: (NOTE) Calculated using the CKD-EPI Creatinine Equation (2021)    Anion gap 8 5 - 15    Comment: Performed at [facility], [address], [place], [place] [address]  Comprehensive metabolic panel     Status: Abnormal   Collection Time: 10/07/23  4:03 AM  Result Value Ref Range   Sodium 139 135 - 145 mmol/L   Potassium 4.5 3.5 - 5.1 mmol/L   Chloride 105 98 - 111 mmol/L   CO2 25 22 - 32 mmol/L   Glucose, Bld 111 (H) 70 - 99 mg/dL    Comment: Glucose reference range applies only to samples taken after fasting for at least 8 hours.   BUN 20 8 - 23 mg/dL   Creatinine, Ser 9.06 0.44 - 1.00 mg/dL   Calcium 9.1 8.9 - 89.6 mg/dL   Total Protein 5.7 (L) 6.5 - 8.1 g/dL   Albumin 2.8 (L) 3.5 - 5.0 g/dL   AST 39 15 - 41 U/L   ALT 67 (H) 0 - 44 U/L   Alkaline Phosphatase 187 (H) 38 - 126 U/L   Total Bilirubin 0.5 0.0 - 1.2 mg/dL   GFR, Estimated >39 >39 mL/min    Comment: (NOTE) Calculated using the CKD-EPI Creatinine Equation (2021)    Anion gap 9 5 - 15    Comment: Performed at [facility], [address], [place], [place] [address]  CBC     Status: Abnormal   Collection Time: 10/07/23  4:03 AM  Result Value Ref Range   WBC 6.1 4.0 - 10.5 K/uL   RBC 4.18 3.87 - 5.11 MIL/uL   Hemoglobin 11.9 (L) 12.0 - 15.0 g/dL   HCT 63.0 63.9 - 53.9 %   MCV 88.3 80.0 - 100.0 fL   MCH 28.5 26.0 - 34.0 pg   MCHC 32.2 30.0 - 36.0 g/dL   RDW 85.2 88.4 - 84.4 %   Platelets 180 150 - 400 K/uL   nRBC 0.0 0.0 - 0.2 %     Comment: Performed at [facility], [address], [place], [place] [address]   No results found.  Anti-infectives (From admission, onward)    Start     Dose/Rate Route Frequency Ordered Stop   10/06/23 1400  cephALEXin  (KEFLEX ) capsule 500 mg        500 mg Oral Every 8 hours 10/06/23 1026     10/06/23 1130  metroNIDAZOLE  (FLAGYL ) tablet 500 mg        500 mg Oral Every 12 hours 10/06/23 1026     10/05/23 1430  amoxicillin -clavulanate (AUGMENTIN ) 875-125 MG per tablet 1 tablet  Status:  Discontinued        1 tablet Oral Every 12 hours 10/05/23 1343 10/06/23 1026   10/01/23 2200  piperacillin -tazobactam (ZOSYN ) IVPB 3.375 g  Status:  Discontinued        3.375 g 12.5 mL/hr over 240 Minutes Intravenous Every 8 hours 10/01/23 1844 10/05/23 1343   10/01/23 1630  piperacillin -tazobactam (ZOSYN ) IVPB 3.375 g        3.375 g 100 mL/hr over 30 Minutes Intravenous  Once 10/01/23 1617 10/01/23 1701       Assessment/Plan Choledocholithiasis with duodenal ulcer  -MRCP shows  Distended gallbladder containing numerous gallstones. Severe intra and extrahepatic biliary ductal dilatation, the common bile duct measuring up to 1.9 cm in caliber and containing multiple small gallstones dependently. ERCP was recommended but was unable to be done d/t D2 ulcer with concerning papilla.  -Bx of papilla is suspicious for adenocarcinoma.  -LFT are elevated but stable and T-Bili is normal. WBC is normal.  -Based on patient history, PE and imaging, I don't think surgical intervention is needed at this time. Patient will most likely need chest CT and staging of possible D2 adenoma. However, the plan is to consult with my attending and talk with our hepatobiliary surgeons to figure out the best approach. -In the meantime, patient should avoid NSAID's.  -Patient appears stable hemodynamically and in NAD at this time.    FEN - Heart heathy diet  VTE - SCDs    ID - Keflex  and Flagyl      Lynch  Syndrome Colon CA Non-Hodgekin's lymphoma    I reviewed nursing notes, hospitalist notes, last 24 h vitals and pain scores, last 48 h intake and output, last 24 h labs and trends, and last 24 h imaging results.   Eulah Hammonds, [facility] 10/07/2023, 9:55 AM Please see Amion for pager number during day hours 7:00am-4:30pm

## 2023-10-07 NOTE — Progress Notes (Addendum)
 Daily Progress Note  DOA: 10/01/2023 Hospital Day: 7   Cc: Common bile duct stones, malignant duodenal  ulcer    Attending physician's note   I personally saw the patient and performed a substantive portion of this encounter (>50% time spent), including a complete performance of at least one of the key components (MDM, Hx and/or Exam), in conjunction with the APP.  I agree with the APP's note, impression, and recommendations with additional input as follows.     73 year old female with Lynch syndrome, colon cancer x2, s/p resection, non-Hodgkin's lymphoma admitted with intermittent epigastric pain and nausea, noted to have multiple gallstones and layering small stones and dilated CBD with tapering near ampulla ERCP unsuccessful in cannulating CBD, cratered duodenal ulcer near ampulla, biopsies suspicious for adenocarcinoma  CA 19-9 and CEA are not elevated  Patient is clinically stable, no leukocytosis, afebrile and bilirubin normal. Alkaline phosphatase, AST and ALT are elevated but slightly lower compared to admission  Surgery and oncology consulted Will likely need EUS and CT chest for staging, will try to coordinate with Dr. Wilhelmenia for EUS inpatient versus outpatient  The patient was provided an opportunity to ask questions and all were answered. The patient agreed with the plan and demonstrated an understanding of the instructions.   LOIS Wilkie Mcgee , MD [phone]    ASSESSMENT    73 year old female with a medical history not limited to Lynch syndrome, colon cancer x 2 status post resection, renal cancer, skin cancer, non-Hodgkin's lymphoma.  Admitted with common bile duct stones, see GI consult from 10/02/2023  Biliary obstruction / Choledocholithiasis Unable to complete ERCP due to cratered duodenal causing edema / distortion at major papilla.   Plan was to transfer her to [facility] for ERCP but plan on hold for now as biopsies suggesting adenocarcinoma  Today >>  LFTs stable. Bilirubin is normal. WBC normal. Afebrile. She feels okay. No significant abdominal pain ( mild, transient gas type pain) .   Duodenal ulcer, path suspicious for adenocarcinoma  CEA normal at 2.4, CA 19-9 normal at 13  Principal Problem:   Common bile duct obstruction Active Problems:   Lynch syndrome   Renal cell carcinoma (HCC)   NHL (non-Hodgkin's lymphoma) (HCC)   Obesity   B12 deficiency   History of colon cancer   Renal stones   Choledocholithiasis   Common bile duct dilatation   Duodenal ulcer   PLAN   --Holding off on [facility] transfer for ERCP. May need to be done outpatient. Bilirubin is stable , afebrile and wbc is normal --[facility] is consulting Oncology  --I consulted Surgery --Patient is aware of the diagnosis.   Subjective   No significant abdominal pain ( mild, transient gas type pain). Transient mid back pain this am    Objective   GI Studies:   A. DUODENUM, ULCER, BIOPSY:  Ulcer with glandular atypia suspicious for adenocarcinoma.  See comment.    Recent Labs    10/05/23 0300 10/07/23 0403  WBC 8.0 6.1  HGB 11.0* 11.9*  HCT 34.4* 36.9  MCV 89.1 88.3  PLT 187 180   No results for input(s): FOLATE, VITAMINB12, FERRITIN, TIBC, IRONPCTSAT in the last 72 hours. Recent Labs    10/05/23 0300 10/06/23 0358 10/07/23 0403  NA 140 140 139  K 4.1 4.6 4.5  CL 109 108 105  CO2 20* 24 25  GLUCOSE 105* 113* 111*  BUN 22 21 20   CREATININE 0.89 1.10* 0.93  CALCIUM 8.7* 9.2 9.1  Recent Labs    10/05/23 0300 10/06/23 0358 10/07/23 0403  PROT 5.7* 5.7* 5.7*  ALBUMIN 2.7* 2.7* 2.8*  AST 26 43* 39  ALT 60* 64* 67*  ALKPHOS 162* 165* 187*  BILITOT 0.4 0.4 0.5      Imaging:  CT ABDOMEN PELVIS W CONTRAST CLINICAL DATA:  Duodenal ulcer. Lynch syndrome. Ascending colon cancer history.  EXAM: CT ABDOMEN AND PELVIS WITH CONTRAST  TECHNIQUE: Multidetector CT imaging of the abdomen and pelvis was performed using the standard  protocol following bolus administration of intravenous contrast.  RADIATION DOSE REDUCTION: This exam was performed according to the departmental dose-optimization program which includes automated exposure control, adjustment of the mA and/or kV according to patient size and/or use of iterative reconstruction technique.  CONTRAST:  75mL OMNIPAQUE  IOHEXOL  350 MG/ML SOLN  COMPARISON:  CT 10/30/2022. MRI abdomen 10/01/2023. Abdominal ultrasound 09/23/2023  FINDINGS: Lower chest: There is some linear opacity lung bases likely scar or atelectasis. Areas of bronchiectasis along the medial right lower lobe, unchanged from previous. Some tree-in-bud reticulonodular changes identified along the extreme inferior aspect of the middle lobe, nonspecific. Small calcification left lower lobe image 45 of series 5.  Hepatobiliary: Dilated biliary tree identified. Increased from the study of 2024. On recent MRI the dilatation with similar. Several filling defects in the distal common duct on the prior MRI. There are several seen today but there seen on the previous MRCP. Dilated gallbladder as well with numerous stones. Please correlate with history. Once again there is some mucosal thickening and enhancement along the distal common duct with common abrupt taper appearance at the ampulla. No space-occupying liver lesion. Patent portal vein.  Pancreas: No pancreatic ductal dilatation or pancreatic atrophy. Small cystic area identified adjacent to the common duct is stable on image 35. Please correlate with prior MRCP.  Spleen: Normal in size without focal abnormality.  Adrenals/Urinary Tract: Adrenal glands are preserved. Small Bosniak 1 right-sided anterior renal cyst as seen on previous examinations. No enhancing right-sided renal mass. Left kidney has focal areas of severe atrophy with calcifications towards the lower pole and distortion. Adjacent perinephric fat stranding and  thickening. Please correlate with known history and any prior intervention. Change were seen on the study of 2024 as well.  Stomach/Bowel: Surgical changes of subtotal colectomy with primary anastomosis in the left hemipelvis. Oral contrast was administered. Overall the bowel is nondilated. No abnormal soft tissue at the surgical anastomosis. Scattered clips throughout the mesentery as well.  Vascular/Lymphatic: Normal caliber aorta and IVC. Scattered vascular calcifications. Several enlarged retroperitoneal and retrocrural nodes identified. Example left periaortic on image 33 of series 3 measures 2.5 x 1.5 cm. In August 2024 this same node measured 2.3 by 1.4 cm. Additional nodes included retrocaval on image 35 today measuring 2.9 by 1.5 cm and going back to August 2024 2.7 x 1.4 cm. Other nodes are similar as well.  Reproductive: Status post hysterectomy. No adnexal masses.  Other: No free air or free fluid.  Musculoskeletal: Scattered degenerative changes. Curvature of the spine  IMPRESSION: Again there are numerous gallstones and evidence of choledocholithiasis with a dilated biliary tree similar to the recent MRI.  There is some thickening in the area of the pancreatic head with a small cystic area and narrowing of the distal common duct at the level of the ampulla with some mucosal thickening. Recommend further evaluation to exclude an ampullary lesion when clinically appropriate.  Surgical changes of subtotal colectomy.  Stable multifocal atrophy of the left  kidney with distortion and stones.  Stable numerous enlarged lymph nodes in the retroperitoneum and retrocrural spaces going back to August 2024. Please correlate for known history.  Electronically Signed   By: Ranell Bring M.D.   On: 10/04/2023 14:39     Scheduled inpatient medications:   cephALEXin   500 mg Oral Q8H   diclofenac   100 mg Rectal Once   magnesium  oxide  400 mg Oral Daily   metroNIDAZOLE    500 mg Oral Q12H   pantoprazole   40 mg Oral BID AC   polyethylene glycol  17 g Oral BID   potassium citrate   10 mEq Oral TID   senna-docusate  1 tablet Oral BID   Continuous inpatient infusions:  PRN inpatient medications: acetaminophen , diphenoxylate -atropine , ondansetron  (ZOFRAN ) IV, traMADol   Vital signs in last 24 hours: Temp:  [97.5 F (36.4 C)-99.3 F (37.4 C)] 97.5 F (36.4 C) (07/22 0916) Pulse Rate:  [62-81] 72 (07/22 0916) Resp:  [17-18] 17 (07/22 0916) BP: (107-158)/(41-92) 132/76 (07/22 0916) SpO2:  [95 %-100 %] 100 % (07/22 0916) Last BM Date : 10/06/23 No intake or output data in the 24 hours ending 10/07/23 1137  Intake/Output from previous day: No intake/output data recorded. Intake/Output this shift: No intake/output data recorded.   Physical Exam:  General: Alert female in NAD Heart:  Regular rate and rhythm.  Pulmonary: Normal respiratory effort Abdomen: Soft, nondistended, nontender. Normal bowel sounds. Neurologic: Alert and oriented Psych: Pleasant. Cooperative     LOS: 6 days   Vina Dasen ,NP 10/07/2023, 11:37 AM

## 2023-10-07 NOTE — Progress Notes (Signed)
 [facility]                                                                              Nicole [name], is a 74 y.o. female, DOB - [birth year], FMW:[id number] Admit date - 10/01/2023    Outpatient Primary MD for the patient is [name], [name], FNP  LOS - 6  days  Chief Complaint  Patient presents with   Abdominal Pain       Brief summary   Patient is a 73 year old female with history of Lynch syndrome, colon CA x2 s/p resection, renal cancer, skin CA, non-Hodkin's lymphoma, B12 deficiency who presented to the ED from GI office due to epigastric pain.  Initial work up on 7/8 with ultrasound revealed a dilated CBD (1.7cm) with intrahepatic dilatation and increased hepatic echogenicity. MRCP was recommended. When she presented to GI clinic on 7/16, she reported intermittent fevers as well.  She was sent to the ED where she has been hypertensive and afebrile (Tmax 69F). WBC normal, TBili 0.6, alkaline phosphatase 391, AST 79, ALT 176. MRCP showed a distended gallbladder with numerous stones, severe intra- and extra-hepatic ductal dilatation with CBD measuring 1.9cm with dependent gallstones. There appears to be tapering to the ampulla without a stone so ?stricture vs. occult mass.  Patient was transferred to [facility] for ERCP.   Assessment & Plan       Common bile duct obstruction, choledocholithiasis, early cholangitis Duodenal ulcer, path + adeno CA - Continue IV Zosyn  - EGD 7/18 showed large periampullary ulcer, biopsies taken concern about duodenal versus ampullary cancer with Lynch syndrome however she has been taking high amount of ibuprofen  with no PPI.  IR versus attempt to get EUS guided bile duct access, not urgent.  ERCP unsuccessful - CEA 2.4, CA 19-9 13 - Continue PPI twice daily  - CT abdomen showed numerous gallstones and evidence of choledocholithiasis with dilated biliary tree.  Some thickening in the area of the pancreatic head with small cystic area and  narrowing of the distal common duct at the level of the ampulla, recommend further evaluation to exclude ampullary lesion - GI following, initially plan was for [facility] day trip for ERCP however now on hold due to biopsies suggesting adeno CA.  Surgery consulted - Oncology consulted, discussed with Dr. Lanny.  Patient reported that her previous oncologist, Dr Rogers is leaving practice.   E. coli urinary tract infection - Urine culture with more than 100,000 colonies of E. coli - Sensitivities reviewed, placed on Keflex , added Flagyl  to provide coverage for early cholangitis    Lynch syndrome, history of colon CA x2 -Continue outpatient follow-up - Patient was following with Dr. Dorsey (oncology, leaving practice) and GI, see #1     Renal cell carcinoma (HCC),  - Has history of left partial nephrectomy in 03/2005, pathology revealed stage T1a clear-cell. She had recurrent renal cell CA in 2020 and was treated with cryoablation on 05/20/2018  - has been following urology outpatient    NHL (non-Hodgkin's lymphoma) [facility]) -Followed Dr. Rogers, per last note, retroperitoneal lymphadenopathy stable, no B symptoms. - Oncology has been consulted.    B12 deficiency -  Continue B12 injections every 3 months  History of nephrolithiasis - Resumed potassium citrate  per patient's request   Obesity class II Estimated body mass index is 36.46 kg/m as calculated from the following:   Height as of this encounter: 5' 0.5 (1.537 m).   Weight as of this encounter: 86.1 kg.  Code Status: Full code DVT Prophylaxis:  Place and maintain sequential compression device Start: 10/02/23 1019   Level of Care: Level of care: Med-Surg Family Communication: Updated patient' Disposition Plan:      Remains inpatient appropriate:   Pending GI recommendations   Procedures:  EGD  Consultants:   Gastroenterology  Antimicrobials:   Anti-infectives (From admission, onward)    Start     Dose/Rate Route  Frequency Ordered Stop   10/06/23 1400  cephALEXin  (KEFLEX ) capsule 500 mg        500 mg Oral Every 8 hours 10/06/23 1026     10/06/23 1130  metroNIDAZOLE  (FLAGYL ) tablet 500 mg        500 mg Oral Every 12 hours 10/06/23 1026     10/05/23 1430  amoxicillin -clavulanate (AUGMENTIN ) 875-125 MG per tablet 1 tablet  Status:  Discontinued        1 tablet Oral Every 12 hours 10/05/23 1343 10/06/23 1026   10/01/23 2200  piperacillin -tazobactam (ZOSYN ) IVPB 3.375 g  Status:  Discontinued        3.375 g 12.5 mL/hr over 240 Minutes Intravenous Every 8 hours 10/01/23 1844 10/05/23 1343   10/01/23 1630  piperacillin -tazobactam (ZOSYN ) IVPB 3.375 g        3.375 g 100 mL/hr over 30 Minutes Intravenous  Once 10/01/23 1617 10/01/23 1701          Medications  cephALEXin   500 mg Oral Q8H   diclofenac   100 mg Rectal Once   magnesium  oxide  400 mg Oral Daily   metroNIDAZOLE   500 mg Oral Q12H   pantoprazole   40 mg Oral BID AC   polyethylene glycol  17 g Oral BID   potassium citrate   10 mEq Oral TID   senna-docusate  1 tablet Oral BID      Subjective:   Nicole [name] was seen and examined today.  No fever chills, chest pain, abdominal pain nausea vomiting.    Objective:   Vitals:   10/06/23 2023 10/07/23 0048 10/07/23 0412 10/07/23 0916  BP: 138/61 (!) 107/41 (!) 153/70 132/76  Pulse: 81 62 76 72  Resp: 18 18 17 17   Temp: 98.4 F (36.9 C) 97.6 F (36.4 C) 97.8 F (36.6 C) (!) 97.5 F (36.4 C)  TempSrc: Oral     SpO2: 100% 95% 100% 100%  Weight:      Height:       No intake or output data in the 24 hours ending 10/07/23 1010    Wt Readings from Last 3 Encounters:  10/01/23 86.1 kg  10/01/23 86.1 kg  07/30/23 87.1 kg   Physical Exam General: Alert and oriented x 3, NAD Cardiovascular: S1 S2 clear, RRR.  Respiratory: CTAB, no wheezing Gastrointestinal: Soft, nontender, nondistended, NBS Ext: no pedal edema bilaterally Neuro: no new deficits Psych: Normal affect      Data Reviewed:  I have personally reviewed following labs    CBC Lab Results  Component Value Date   WBC 6.1 10/07/2023   RBC 4.18 10/07/2023   HGB 11.9 (L) 10/07/2023   HCT 36.9 10/07/2023   MCV 88.3 10/07/2023   MCH 28.5 10/07/2023   PLT 180  10/07/2023   MCHC 32.2 10/07/2023   RDW 14.7 10/07/2023   LYMPHSABS 0.9 10/01/2023   MONOABS 1.1 (H) 10/01/2023   EOSABS 0.1 10/01/2023   BASOSABS 0.1 10/01/2023     Last metabolic panel Lab Results  Component Value Date   NA 139 10/07/2023   K 4.5 10/07/2023   CL 105 10/07/2023   CO2 25 10/07/2023   BUN 20 10/07/2023   CREATININE 0.93 10/07/2023   GLUCOSE 111 (H) 10/07/2023   GFRNONAA >60 10/07/2023   GFRAA >60 09/16/2019   CALCIUM 9.1 10/07/2023   PROT 5.7 (L) 10/07/2023   ALBUMIN 2.8 (L) 10/07/2023   BILITOT 0.5 10/07/2023   ALKPHOS 187 (H) 10/07/2023   AST 39 10/07/2023   ALT 67 (H) 10/07/2023   ANIONGAP 9 10/07/2023    CBG (last 3)  No results for input(s): GLUCAP in the last 72 hours.    Coagulation Profile: Recent Labs  Lab 10/02/23 1137  INR 1.1     Radiology Studies: I have personally reviewed the imaging studies  No results found.       Nydia Distance M.D. [facility] 10/07/2023, 10:10 AM  Available via Epic secure chat 7am-7pm After 7 pm, please refer to night coverage provider listed on amion.

## 2023-10-07 NOTE — Consult Note (Addendum)
 [facility] CONSULT NOTE  Patient Care Team: [name] [name], FNP as PCP - General (Family Medicine) Matilda Senior, MD as Consulting Physician (Urology) [facility], [facility]  Lloyd Savannah, MD as Referring Physician (Dermatology) Rogers Hai, MD as Consulting Physician (Hematology)  CHIEF COMPLAINTS/PURPOSE OF CONSULTATION:  Adenocarcinoma  REFERRING PHYSICIAN: Dr. Davia  HISTORY OF PRESENTING ILLNESS:  Nicole [name] 73 y.o. female who came to the ED on 10/01/2023 complaining of abdominal pain.  Oncology evaluation requested due to patient's significant history of multiple cancers.  Has been followed by Dr. Katragadda at [facility] who has left or is leaving [facility]. Patient is seen assessed and examined today.  She is a very pleasant lady who is a good historian and details her history per this documentation.  Patient reports that she has had gallstones for several years and in May 2025 started experiencing increased abdominal and back pain.  Went to see her PCP who had an ultrasound done that showed gallstones blocking her bile ducts.  She was referred to a GI doctor with subsequent MRCP being done. Medical history is significant for Lynch syndrome, colon cancer-status post 3 resections: Renal cancer status post left partial nephrectomy, skin cancer status post Mohs procedure. Surgical history includes 3 colectomy's as stated above, nephrectomy, Mohs procedure, appendectomy, and hysterectomy. Family history includes mother with colon cancer and pancreatic cancer in her 51s.  Also maternal grandfather had pancreatic cancer.  Patient's son died at age 33 from pancreatic cancer. Social history is noncontributory-denies tobacco use, denies alcohol use, denies illicit and recreational drug use.  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     I have reviewed her chart and materials related to her cancer  extensively and collaborated history with the patient. Summary of oncologic history is as follows: Oncology History   No history exists.    ASSESSMENT & PLAN:  Common bile duct obstruction Transaminitis - Duodenal ulcer biopsy done 10/03/2023 shows suspicion for adenocarcinoma - GI following.  Plan was for ERCP at [facility] however now on hold due to adenocarcinoma from duodenal biopsy. - Medical oncology/Dr. Lanny following and will make further evaluation and treatment recommendations.  History of colon cancer Lynch syndrome, MLH1 mutation - Initially diagnosed in 2001.   --She is status post 3 subtotal colectomies in 2001, 2002, and 2007.  States she never had colostomy placed during any of those surgeries. - Denies receiving chemotherapy -- Last flexible sigmoidoscopy 10/06/2019, normal - Followed at [facility] with Dr. Rogers  Left Renal cell carcinoma, clear cell, Stage T1a - Diagnosed initially in 2007 - Status post left partial nephrectomy -- Recurrence in 2020, status post cryoablation -- Continue Urology follow-up as outpatient  Skin cancer - Diagnosed July 2024 - Status post Mohs procedure on right lower leg by her ankle  History of Follicular lymphoma --Retroperitoneal lymphadenopathy, stable --No B symptoms  Anemia - Mild - Hemoglobin 11.9 today - Baseline appears to be in the 12-13 range - Continue to monitor CBC with differential    MEDICAL HISTORY:  Past Medical History:  Diagnosis Date   Allergy ?   Anemia    Arthritis    shorulder   B12 deficiency    Chronic diarrhea SECONDARY HX COLON CANCER   History of colon cancer [facility] SYNDROME ---  COLORECTAL CANCER S/P COLECTOMY X3  LAST ONE 2007   NO RECURRENCE   History of colon cancer 05/04/2013   History of kidney  infection 08/2013   History of kidney stones    History of MRSA infection 08/2013   left leg   Hx of osteopenia    Hyperlipidemia ?   Lynch syndrome 10/17/2010   Hereditary colon cancer    Neuromuscular disorder (HCC) ?   NHL (non-Hodgkin's lymphoma) (HCC) 10/17/2010   MONITORED BY DR PERNELL   Personal history of renal cancer S/P LEFT PARTIAL NEPHRECTOMY 2007--  NO RECURRENCE   DUE TO KIDNEY CANCER   Right shoulder pain S/P ROTATOR CUFF REPAIR   Skin cancer     SURGICAL HISTORY: Past Surgical History:  Procedure Laterality Date   ABDOMINAL HYSTERECTOMY     APPENDECTOMY  1993   W/ LAVH   COLON SURGERY  2001,2002,2007   COLONOSCOPY W/ POLYPECTOMY     CYSTOSCOPY W/ RETROGRADES  07/14/2014   Procedure: CYSTOSCOPY WITH RETROGRADE PYELOGRAM;  Surgeon: Garnette Shack, MD;  Location: [facility];  Service: Urology;;   CYSTOSCOPY W/ URETERAL STENT REMOVAL Left 07/14/2014   Procedure: CYSTOSCOPY WITH STENT REMOVAL;  Surgeon: Garnette Shack, MD;  Location: [facility];  Service: Urology;  Laterality: Left;   CYSTOSCOPY WITH RETROGRADE PYELOGRAM, URETEROSCOPY AND STENT PLACEMENT Left 11/30/2020   Procedure: CYSTOSCOPY WITH RETROGRADE PYELOGRAM, URETEROSCOPY/STONE EXTRACTION AND STENT PLACEMENT;  Surgeon: Shack Garnette, MD;  Location: [facility];  Service: Urology;  Laterality: Left;   CYSTOSCOPY WITH RETROGRADE PYELOGRAM, URETEROSCOPY AND STENT PLACEMENT Left 06/24/2022   Procedure: CYSTOSCOPY WITH RETROGRADE PYELOGRAM, URETEROSCOPY AND STENT PLACEMENT;  Surgeon: Shack Garnette, MD;  Location: [facility];  Service: Urology;  Laterality: Left;   CYSTOSCOPY WITH STENT PLACEMENT  02/07/2012   Procedure: CYSTOSCOPY WITH STENT PLACEMENT;  Surgeon: Garnette Shack, MD;  Location[facility];  Service: Urology;  Laterality: Left;   CYSTOSCOPY WITH STENT PLACEMENT Left 07/14/2014   Procedure: CYSTOSCOPY WITH STENT PLACEMENT;  Surgeon: Garnette Shack, MD;  Location: [facility];  Service: Urology;  Laterality: Left;   ESOPHAGOGASTRODUODENOSCOPY N/A 10/03/2023   Procedure: EGD  (ESOPHAGOGASTRODUODENOSCOPY);  Surgeon: Avram Lupita BRAVO, MD;  Location: [facility];  Service: Gastroenterology;  Laterality: N/A;  ercp scheduled, but unable to be done due to large ulcer   EYE SURGERY  ?   Removal of styes   FLEXIBLE SIGMOIDOSCOPY  08/29/2011   Procedure: FLEXIBLE SIGMOIDOSCOPY;  Surgeon: Claudis RAYMOND Rivet, MD;  [facility];  Service: Endoscopy;  Laterality: N/A;  930   FLEXIBLE SIGMOIDOSCOPY N/A 09/28/2012   Procedure: FLEXIBLE SIGMOIDOSCOPY;  Surgeon: Claudis RAYMOND Rivet, MD;  Location: [facility];  Service: Endoscopy;  Laterality: N/A;  730   FLEXIBLE SIGMOIDOSCOPY N/A 07/08/2014   Procedure: FLEXIBLE SIGMOIDOSCOPY;  Surgeon: Claudis RAYMOND Rivet, MD;  Location: [facility];  Service: Endoscopy;  Laterality: N/A;  830 - moved to 10:20 - Ann to notify pt   FLEXIBLE SIGMOIDOSCOPY N/A 08/11/2015   Procedure: FLEXIBLE SIGMOIDOSCOPY;  Surgeon: Claudis RAYMOND Rivet, MD;  Location: [facility];  Service: Endoscopy;  Laterality: N/A;  730   FLEXIBLE SIGMOIDOSCOPY N/A 08/13/2016   Procedure: FLEXIBLE SIGMOIDOSCOPY;  Surgeon: Rivet Claudis RAYMOND, MD;  [facility]: [facility];  Service: Endoscopy;  Laterality: N/A;  730   FLEXIBLE SIGMOIDOSCOPY N/A 10/06/2019   Procedure: FLEXIBLE SIGMOIDOSCOPY;  Surgeon: Rivet Claudis RAYMOND, MD;  [facility]: [facility];  Service: Endoscopy;  Laterality: N/A;  1245   HEMICOLECTOMY  2001   right   HEMICOLECTOMY  2002   left   HOLMIUM LASER  APPLICATION Left 07/14/2014   Procedure: HOLMIUM LASER LITHOTRIPSY,;  Surgeon: Garnette Shack, MD;  Location: [facility];  Service: Urology;  Laterality: Left;   HOLMIUM LASER APPLICATION Left 11/30/2020   Procedure: HOLMIUM LASER APPLICATION;  Surgeon: Shack Garnette, MD;  Location: [facility];  Service: Urology;  Laterality: Left;   HOLMIUM LASER APPLICATION Left 06/24/2022   Procedure: HOLMIUM LASER APPLICATION;  Surgeon: Shack Garnette, MD;  Location: [facility];  Service: Urology;  Laterality: Left;   IR RADIOLOGIST EVAL & MGMT  04/29/2018   IR RADIOLOGIST EVAL & MGMT  10/07/2019   IR RADIOLOGIST EVAL & MGMT  10/11/2020   IR RADIOLOGIST EVAL & MGMT  11/08/2021   kidney resection  2007   left partial   LAPAROSCOPIC ASSISTED VAGINAL HYSTERECTOMY  1993   W/ BILATERAL SALPINGO-OOPHORECTOMY   LEFT FLANK EXPLORATION W/  PARTIAL LEFT NEPHRECTOMY/ LEFT HILAR LYMPH NODE BX  04-17-2005  DR DAHLSTEDT   RENAL CELL CARCINOMA   LITHOTRIPSY  11-22013   x2   NEPHROLITHOTOMY Left 06/09/2014   Procedure: NEPHROLITHOTOMY PERCUTANEOUS;  Surgeon: Garnette Shack, MD;  Location: [facility];  Service: Urology;  Laterality: Left;  with STENT   RADIOLOGY WITH ANESTHESIA Left 05/20/2018   Procedure: CT WITH ANESTHESIA LEFT RENAL CRYOABLATION AND BIOPSY;  Surgeon: Vanice Sharper, MD;  Location: [facility];  Service: Anesthesiology;  Laterality: Left;   RIGHT ROTATOR  CUFF REPAIR/   BICEP REPAIR  10-17-2011  DR SUPPLE   SUBTOTAL COLECTOMY  2007   URETEROSCOPY WITH HOLMIUM LASER LITHOTRIPSY Left 07/14/2014   Procedure: URETEROSCOPY  EXTRACTION OF STONES;  Surgeon: Garnette Shack, MD;  Location: [facility];  Service: Urology;  Laterality: Left;    SOCIAL HISTORY: Social History   Socioeconomic History   Marital status: Divorced    Spouse name: Not on file   Number of children: Not on file   Years of education: Not on file   Highest education level: 12th grade  Occupational History   Not on file  Tobacco Use   Smoking status: Never   Smokeless tobacco: Never  Vaping Use   Vaping status: Never Used  Substance and Sexual Activity   Alcohol use: Not Currently    Comment: 2 glasses of wine per year - maybe   Drug use: Never   Sexual activity: Not Currently  Other Topics Concern   Not on file  Social History Narrative   Not on file   Social Drivers of Health   Financial Resource Strain: Low Risk  (07/30/2023)   Overall Financial  Resource Strain (CARDIA)    Difficulty of Paying Living Expenses: Not very hard  Food Insecurity: No Food Insecurity (10/02/2023)   Hunger Vital Sign    Worried About Running Out of Food in the Last Year: Never true    Ran Out of Food in the Last Year: Never true  Transportation Needs: No Transportation Needs (10/02/2023)   PRAPARE - Administrator, Civil Service (Medical): No    Lack of Transportation (Non-Medical): No  Physical Activity: Insufficiently Active (07/27/2023)   Exercise Vital Sign    Days of Exercise per Week: 3 days    Minutes of Exercise per Session: 30 min  Stress: No Stress Concern Present (07/30/2023)   [employer] of Occupational Health - Occupational Stress Questionnaire    Feeling of Stress : Not at all  Social Connections: Moderately Integrated (10/02/2023)   Social Connection and Isolation Panel  Frequency of Communication with Friends and Family: More than three times a week    Frequency of Social Gatherings with Friends and Family: Twice a week    Attends Religious Services: More than 4 times per year    Active Member of [employer] or Organizations: Yes    Attends Engineer, structural: More than 4 times per year    Marital Status: Divorced  Intimate Partner Violence: Not At Risk (10/02/2023)   Humiliation, Afraid, Rape, and Kick questionnaire    Fear of Current or Ex-Partner: No    Emotionally Abused: No    Physically Abused: No    Sexually Abused: No    FAMILY HISTORY: Family History  Problem Relation Age of Onset   Cancer Son    Early death Son    Cancer Mother    Cancer Maternal Grandfather      PHYSICAL EXAMINATION: ECOG PERFORMANCE STATUS: 2 - Symptomatic, <50% confined to bed  Vitals:   10/07/23 0916 10/07/23 1251  BP: 132/76 (!) 146/70  Pulse: 72 83  Resp: 17 17  Temp: (!) 97.5 F (36.4 C) 98.1 F (36.7 C)  SpO2: 100% 97%   Filed Weights   10/01/23 1209  Weight: 189 lb 12.8 oz (86.1 kg)    GENERAL: alert,  no distress and comfortable SKIN: skin color, texture, turgor are normal, no rashes or significant lesions EYES: normal, conjunctiva are pink and non-injected, sclera clear OROPHARYNX: no exudate, no erythema and lips, buccal mucosa, and tongue normal  NECK: supple, thyroid  normal size, non-tender, without nodularity LYMPH: no palpable lymphadenopathy in the cervical, axillary or inguinal LUNGS: clear to auscultation and percussion with normal breathing effort HEART: regular rate & rhythm and no murmurs and no lower extremity edema ABDOMEN: abdomen soft, non-tender and normal bowel sounds MUSCULOSKELETAL: no cyanosis of digits and no clubbing  PSYCH: alert & oriented x 3 with fluent speech NEURO: no focal motor/sensory deficits   ALLERGIES:  is allergic to latex, morphine and codeine, and tape.  MEDICATIONS:  Current Facility-Administered Medications  Medication Dose Route Frequency Provider Last Rate Last Admin   acetaminophen  (TYLENOL ) tablet 1,000 mg  1,000 mg Oral Q8H PRN Avram Lupita BRAVO, MD       cephALEXin  (KEFLEX ) capsule 500 mg  500 mg Oral Q8H Rai, Ripudeep K, MD   500 mg at 10/07/23 9461   diclofenac  suppository 100 mg  100 mg Rectal Once Avram Lupita BRAVO, MD       diphenoxylate -atropine  (LOMOTIL ) 2.5-0.025 MG per tablet 1 tablet  1 tablet Oral QID PRN Gessner, Carl E, MD       magnesium  oxide (MAG-OX) tablet 400 mg  400 mg Oral Daily Rai, Ripudeep K, MD   400 mg at 10/07/23 9091   metroNIDAZOLE  (FLAGYL ) tablet 500 mg  500 mg Oral Q12H Rai, Ripudeep K, MD   500 mg at 10/07/23 0908   ondansetron  (ZOFRAN ) injection 4 mg  4 mg Intravenous Q6H PRN Gessner, Carl E, MD   4 mg at 10/03/23 0957   pantoprazole  (PROTONIX ) EC tablet 40 mg  40 mg Oral BID AC Rai, Ripudeep K, MD   40 mg at 10/07/23 0538   polyethylene glycol (MIRALAX  / GLYCOLAX ) packet 17 g  17 g Oral BID Sundil, Subrina, MD       potassium citrate  (UROCIT-K ) SR tablet 10 mEq  10 mEq Oral TID Rai, Ripudeep K, MD   10 mEq at  10/07/23 0917   senna-docusate (Senokot-S) tablet 1 tablet  1  tablet Oral BID Sundil, Subrina, MD       traMADol  (ULTRAM ) tablet 50 mg  50 mg Oral Q8H PRN Rai, Ripudeep K, MD         LABORATORY DATA:  I have reviewed the data as listed Lab Results  Component Value Date   WBC 6.1 10/07/2023   HGB 11.9 (L) 10/07/2023   HCT 36.9 10/07/2023   MCV 88.3 10/07/2023   PLT 180 10/07/2023   Recent Labs    08/21/23 0828 10/01/23 1251 10/05/23 0300 10/06/23 0358 10/07/23 0403  NA  --    < > 140 140 139  K  --    < > 4.1 4.6 4.5  CL  --    < > 109 108 105  CO2  --    < > 20* 24 25  GLUCOSE  --    < > 105* 113* 111*  BUN  --    < > 22 21 20   CREATININE  --    < > 0.89 1.10* 0.93  CALCIUM  --    < > 8.7* 9.2 9.1  GFRNONAA  --    < > >60 53* >60  PROT 6.2   < > 5.7* 5.7* 5.7*  ALBUMIN  --    < > 2.7* 2.7* 2.8*  AST 19   < > 26 43* 39  ALT 27   < > 60* 64* 67*  ALKPHOS  --    < > 162* 165* 187*  BILITOT 0.3   < > 0.4 0.4 0.5  BILIDIR 0.1  --   --   --   --   IBILI 0.2  --   --   --   --    < > = values in this interval not displayed.    RADIOGRAPHIC STUDIES: I have personally reviewed the radiological images as listed and agreed with the findings in the report. CT ABDOMEN PELVIS W CONTRAST Result Date: 10/04/2023 CLINICAL DATA:  Duodenal ulcer. Lynch syndrome. Ascending colon cancer history. EXAM: CT ABDOMEN AND PELVIS WITH CONTRAST TECHNIQUE: Multidetector CT imaging of the abdomen and pelvis was performed using the standard protocol following bolus administration of intravenous contrast. RADIATION DOSE REDUCTION: This exam was performed according to the departmental dose-optimization program which includes automated exposure control, adjustment of the mA and/or kV according to patient size and/or use of iterative reconstruction technique. CONTRAST:  75mL OMNIPAQUE  IOHEXOL  350 MG/ML SOLN COMPARISON:  CT 10/30/2022. MRI abdomen 10/01/2023. Abdominal ultrasound 09/23/2023 FINDINGS: Lower  chest: There is some linear opacity lung bases likely scar or atelectasis. Areas of bronchiectasis along the medial right lower lobe, unchanged from previous. Some tree-in-bud reticulonodular changes identified along the extreme inferior aspect of the middle lobe, nonspecific. Small calcification left lower lobe image 45 of series 5. Hepatobiliary: Dilated biliary tree identified. Increased from the study of 2024. On recent MRI the dilatation with similar. Several filling defects in the distal common duct on the prior MRI. There are several seen today but there seen on the previous MRCP. Dilated gallbladder as well with numerous stones. Please correlate with history. Once again there is some mucosal thickening and enhancement along the distal common duct with common abrupt taper appearance at the ampulla. No space-occupying liver lesion. Patent portal vein. Pancreas: No pancreatic ductal dilatation or pancreatic atrophy. Small cystic area identified adjacent to the common duct is stable on image 35. Please correlate with prior MRCP. Spleen: Normal in size without focal abnormality. Adrenals/Urinary Tract: Adrenal glands  are preserved. Small Bosniak 1 right-sided anterior renal cyst as seen on previous examinations. No enhancing right-sided renal mass. Left kidney has focal areas of severe atrophy with calcifications towards the lower pole and distortion. Adjacent perinephric fat stranding and thickening. Please correlate with known history and any prior intervention. Change were seen on the study of 2024 as well. Stomach/Bowel: Surgical changes of subtotal colectomy with primary anastomosis in the left hemipelvis. Oral contrast was administered. Overall the bowel is nondilated. No abnormal soft tissue at the surgical anastomosis. Scattered clips throughout the mesentery as well. Vascular/Lymphatic: Normal caliber aorta and IVC. Scattered vascular calcifications. Several enlarged retroperitoneal and retrocrural nodes  identified. Example left periaortic on image 33 of series 3 measures 2.5 x 1.5 cm. In August 2024 this same node measured 2.3 by 1.4 cm. Additional nodes included retrocaval on image 35 today measuring 2.9 by 1.5 cm and going back to August 2024 2.7 x 1.4 cm. Other nodes are similar as well. Reproductive: Status post hysterectomy. No adnexal masses. Other: No free air or free fluid. Musculoskeletal: Scattered degenerative changes. Curvature of the spine IMPRESSION: Again there are numerous gallstones and evidence of choledocholithiasis with a dilated biliary tree similar to the recent MRI. There is some thickening in the area of the pancreatic head with a small cystic area and narrowing of the distal common duct at the level of the ampulla with some mucosal thickening. Recommend further evaluation to exclude an ampullary lesion when clinically appropriate. Surgical changes of subtotal colectomy. Stable multifocal atrophy of the left kidney with distortion and stones. Stable numerous enlarged lymph nodes in the retroperitoneum and retrocrural spaces going back to August 2024. Please correlate for known history. Electronically Signed   By: Ranell Bring M.D.   On: 10/04/2023 14:39   MR ABDOMEN MRCP W WO CONTAST Result Date: 10/01/2023 CLINICAL DATA:  Cholelithiasis history of ascending colon cancer EXAM: MRI ABDOMEN WITHOUT AND WITH CONTRAST (INCLUDING MRCP) TECHNIQUE: Multiplanar multisequence MR imaging of the abdomen was performed both before and after the administration of intravenous contrast. Heavily T2-weighted images of the biliary and pancreatic ducts were obtained, and three-dimensional MRCP images were rendered by post processing. CONTRAST:  9mL GADAVIST  GADOBUTROL  1 MMOL/ML IV SOLN COMPARISON:  Right upper quadrant ultrasound, 09/23/2023 CT abdomen pelvis, 10/30/2022 FINDINGS: Lower chest: No acute abnormality. Hepatobiliary: No solid liver abnormality is seen. Distended gallbladder containing numerous  gallstones. Severe intra and extrahepatic biliary ductal dilatation, the common bile duct measuring up to 1.9 cm in caliber and containing multiple small gallstones dependently (series 6, image 21). The duct however appears to taper to the ampulla without calculus of the ampulla (series 5, image 15). Pancreas: Unremarkable. No pancreatic ductal dilatation or surrounding inflammatory changes. Spleen: Normal in size without significant abnormality. Adrenals/Urinary Tract: Adrenal glands are unremarkable. Unchanged extensive left renal cortical scarring. Right kidney is normal, without obvious renal calculi, solid lesion, or hydronephrosis. Stomach/Bowel: Stomach is within normal limits. No evidence of bowel wall thickening, distention, or inflammatory changes. Status post partial right hemicolectomy. Vascular/Lymphatic: Aortic atherosclerosis. Enlarged retroperitoneal lymph nodes unchanged when compared to examination dated 10/30/2022, left retroperitoneal nodes measuring up 2 1.7 x 1.2 cm (series 17, image 51). Other: No abdominal wall hernia or abnormality. No ascites. Musculoskeletal: No acute or significant osseous findings. IMPRESSION: 1. Distended gallbladder containing numerous gallstones. 2. Severe intra and extrahepatic biliary ductal dilatation, the common bile duct measuring up to 1.9 cm in caliber and containing multiple small gallstones dependently. The duct however appears  to taper to the ampulla without calculus of the ampulla. Findings are highly suspicious for ampullary stricture or perhaps an occult mass. Notably, biliary ductal dilatation is new when compared to relatively recent prior CT dated 10/30/2022. Consider ERCP for further assessment. 3. Enlarged retroperitoneal lymph nodes unchanged when compared to examination dated 10/30/2022, left retroperitoneal nodes measuring up to 1.7 x 1.2 cm. 4. Status post partial right hemicolectomy. 5. Unchanged extensive left renal cortical scarring.  Electronically Signed   By: Marolyn JONETTA Jaksch M.D.   On: 10/01/2023 16:01   MR 3D Recon At Scanner Result Date: 10/01/2023 CLINICAL DATA:  Cholelithiasis history of ascending colon cancer EXAM: MRI ABDOMEN WITHOUT AND WITH CONTRAST (INCLUDING MRCP) TECHNIQUE: Multiplanar multisequence MR imaging of the abdomen was performed both before and after the administration of intravenous contrast. Heavily T2-weighted images of the biliary and pancreatic ducts were obtained, and three-dimensional MRCP images were rendered by post processing. CONTRAST:  9mL GADAVIST  GADOBUTROL  1 MMOL/ML IV SOLN COMPARISON:  Right upper quadrant ultrasound, 09/23/2023 CT abdomen pelvis, 10/30/2022 FINDINGS: Lower chest: No acute abnormality. Hepatobiliary: No solid liver abnormality is seen. Distended gallbladder containing numerous gallstones. Severe intra and extrahepatic biliary ductal dilatation, the common bile duct measuring up to 1.9 cm in caliber and containing multiple small gallstones dependently (series 6, image 21). The duct however appears to taper to the ampulla without calculus of the ampulla (series 5, image 15). Pancreas: Unremarkable. No pancreatic ductal dilatation or surrounding inflammatory changes. Spleen: Normal in size without significant abnormality. Adrenals/Urinary Tract: Adrenal glands are unremarkable. Unchanged extensive left renal cortical scarring. Right kidney is normal, without obvious renal calculi, solid lesion, or hydronephrosis. Stomach/Bowel: Stomach is within normal limits. No evidence of bowel wall thickening, distention, or inflammatory changes. Status post partial right hemicolectomy. Vascular/Lymphatic: Aortic atherosclerosis. Enlarged retroperitoneal lymph nodes unchanged when compared to examination dated 10/30/2022, left retroperitoneal nodes measuring up 2 1.7 x 1.2 cm (series 17, image 51). Other: No abdominal wall hernia or abnormality. No ascites. Musculoskeletal: No acute or significant osseous  findings. IMPRESSION: 1. Distended gallbladder containing numerous gallstones. 2. Severe intra and extrahepatic biliary ductal dilatation, the common bile duct measuring up to 1.9 cm in caliber and containing multiple small gallstones dependently. The duct however appears to taper to the ampulla without calculus of the ampulla. Findings are highly suspicious for ampullary stricture or perhaps an occult mass. Notably, biliary ductal dilatation is new when compared to relatively recent prior CT dated 10/30/2022. Consider ERCP for further assessment. 3. Enlarged retroperitoneal lymph nodes unchanged when compared to examination dated 10/30/2022, left retroperitoneal nodes measuring up to 1.7 x 1.2 cm. 4. Status post partial right hemicolectomy. 5. Unchanged extensive left renal cortical scarring. Electronically Signed   By: Marolyn JONETTA Jaksch M.D.   On: 10/01/2023 16:01   US  Abdomen Complete Result Date: 09/23/2023 CLINICAL DATA:  Abdominal pain EXAM: ABDOMEN ULTRASOUND COMPLETE COMPARISON:  CT 10/30/2022 FINDINGS: Gallbladder: Multiple echogenic, shadowing stones within the gallbladder measuring to 1.7 cm in diameter. No gallbladder wall thickening. No pericholecystic fluid. No Murphy sign reported on exam. Common bile duct: Diameter: 1.7 cm. Liver: No focal lesion identified. Mildly increased hepatic parenchymal echogenicity. Mild intrahepatic biliary dilatation. Portal vein is patent on color Doppler imaging with normal direction of blood flow towards the liver. IVC: No abnormality visualized. Pancreas: Visualized portion unremarkable. Spleen: Size and appearance within normal limits. Right Kidney: Length: 12.1 cm. Echogenicity within normal limits. No mass or hydronephrosis visualized. Left Kidney: Length: 11.1 cm. Echogenicity within normal  limits. 1.1 cm shadowing stone within the left kidney. No mass or hydronephrosis visualized. Abdominal aorta: No aneurysm visualized. Other findings: None. IMPRESSION: 1.  Cholelithiasis without sonographic evidence of acute cholecystitis. 2. Dilated common bile duct is abnormally dilated measuring up to 1.7 cm with mild intrahepatic biliary dilatation. Recommend further evaluation with MRCP. 3. Mildly increased hepatic parenchymal echogenicity, which is nonspecific but can be seen in the setting of hepatic steatosis. 4. Nonobstructing left renal stone. Electronically Signed   By: Mabel Converse D.O.   On: 09/23/2023 10:54     The total time spent in the appointment was 55 minutes encounter with patients including review of chart and various tests results, discussions about plan of care and coordination of care plan   All questions were answered. The patient knows to call the clinic with any problems, questions or concerns. No barriers to learning was detected.  Olam PARAS Rouson, NP 7/22/20251:51 PM   ADDENDUM I have seen the patient, examined her. I agree with the assessment and and plan and have edited the notes.   72 year old female with Lynch syndrome, early stage colon cancer x 3, status post subtotal colectomy, renal cell carcinoma, follicular lymphoma for near 20 years, presented with abdominal pain.  Imaging showed significant airway obstruction, no liver or pancreatic mass.  EGD showed an ulcerated mass at the ampulla, biopsy showed atypical cells suspicious for adenocarcinoma.  ERCP was attempted but failed.  She did not jaundice.  CT of abdomen pelvis showed a stable adenopathy, which is probably related to her lymphoma.  CT chest is pending.  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 If confirmed, she would benefit from immunotherapy, with potential cure without surgery.  I discussed that Whipple surgery is the standard of care for nonmetastatic ampullary carcinoma.  Patient has been seen by general surgery, and will see pancreatobiliary surgeon Dr. Dasie or Dr. Aron in office.  Patient lives in [place], prefers  to receive care at [facility].  I will refer her to Dr. Davonna.  All questions were answered.  I spent a total of 60 minutes for her visit today.  Onita Mattock MD 10/07/2023

## 2023-10-08 ENCOUNTER — Telehealth: Payer: Self-pay

## 2023-10-08 DIAGNOSIS — Z1509 Genetic susceptibility to other malignant neoplasm: Secondary | ICD-10-CM

## 2023-10-08 DIAGNOSIS — D630 Anemia in neoplastic disease: Secondary | ICD-10-CM | POA: Insufficient documentation

## 2023-10-08 DIAGNOSIS — K831 Obstruction of bile duct: Secondary | ICD-10-CM | POA: Diagnosis not present

## 2023-10-08 DIAGNOSIS — C170 Malignant neoplasm of duodenum: Principal | ICD-10-CM

## 2023-10-08 LAB — CBC
HCT: 36.2 % (ref 36.0–46.0)
Hemoglobin: 11.6 g/dL — ABNORMAL LOW (ref 12.0–15.0)
MCH: 28.2 pg (ref 26.0–34.0)
MCHC: 32 g/dL (ref 30.0–36.0)
MCV: 88.1 fL (ref 80.0–100.0)
Platelets: 181 K/uL (ref 150–400)
RBC: 4.11 MIL/uL (ref 3.87–5.11)
RDW: 14.5 % (ref 11.5–15.5)
WBC: 7.4 K/uL (ref 4.0–10.5)
nRBC: 0 % (ref 0.0–0.2)

## 2023-10-08 LAB — COMPREHENSIVE METABOLIC PANEL WITH GFR
ALT: 66 U/L — ABNORMAL HIGH (ref 0–44)
AST: 49 U/L — ABNORMAL HIGH (ref 15–41)
Albumin: 2.7 g/dL — ABNORMAL LOW (ref 3.5–5.0)
Alkaline Phosphatase: 196 U/L — ABNORMAL HIGH (ref 38–126)
Anion gap: 8 (ref 5–15)
BUN: 18 mg/dL (ref 8–23)
CO2: 25 mmol/L (ref 22–32)
Calcium: 8.8 mg/dL — ABNORMAL LOW (ref 8.9–10.3)
Chloride: 104 mmol/L (ref 98–111)
Creatinine, Ser: 0.95 mg/dL (ref 0.44–1.00)
GFR, Estimated: >60 mL/min (ref >60)
Glucose, Bld: 111 mg/dL — ABNORMAL HIGH (ref 70–99)
Potassium: 4.4 mmol/L (ref 3.5–5.1)
Sodium: 137 mmol/L (ref 135–145)
Total Bilirubin: 0.7 mg/dL (ref 0.0–1.2)
Total Protein: 5.6 g/dL — ABNORMAL LOW (ref 6.5–8.1)

## 2023-10-08 MED ORDER — CEPHALEXIN 500 MG PO CAPS: 500.0000 mg | ORAL_CAPSULE | Freq: Three times a day (TID) | ORAL | 0 refills | Status: AC

## 2023-10-08 MED ORDER — METRONIDAZOLE 500 MG PO TABS: 500.0000 mg | ORAL_TABLET | Freq: Two times a day (BID) | ORAL | 0 refills | Status: AC

## 2023-10-08 NOTE — Progress Notes (Addendum)
 Daily Progress Note  DOA: 10/01/2023 Hospital Day: 8   Cc:  Common bile duct stones, malignant duodenal  ulcer    Attending physician's note   I personally saw the patient and performed a substantive portion of this encounter (>50% time spent), including a complete performance of at least one of the key components (MDM, Hx and/or Exam), in conjunction with the APP.  I agree with the APP's note, impression, and recommendations with additional input as follows.     Overall stable with no significant change in LFTs, bilirubin remains normal.  No leukocytosis, remains afebrile with stable hemodynamics.  Plan for surgery outpatient follow-up with pancreaticobiliary surgeon Dr. Aron, or Dr. Dasie and oncology is considering possible immunotherapy based on MSI testing.  Patient is stable to be discharged with outpatient follow-up , EUS for staging and ERCP with stenting for biliary obstruction.  Dr. Wilhelmenia does not have availability for EUS/ERCP anytime soon  Urgent referral sent to [facility], discussed case with Biliary/advanced GI fellow Alanson Polio, tentatively planning procedures next week.  Plan to continue antibiotics until procedure, needs Rx for 7 to 10 days  The patient was provided an opportunity to ask questions and all were answered. The patient agreed with the plan and demonstrated an understanding of the instructions.   LOIS Wilkie Mcgee , MD [phone]     ASSESSMENT    73 year old female with a medical history not limited to Lynch syndrome, colon cancer x 2 status post resection, renal cancer, skin cancer, non-Hodgkin's lymphoma.  Admitted with common bile duct stones, see GI consult from 10/02/2023   Biliary obstruction / ? Choledocholithiasis Unable to complete ERCP due to cratered duodenal causing edema / distortion at major papilla.   Initial plan was to transfer her to [facility] for ERCP but plan on hold for now as biopsies suggesting adenocarcinoma.     Today >> Alk phos slightly higher - 196.  Bilirubin remains normal. WBC normal. Afebrile. She feels okay. No significant abdominal pain    Duodenal ulcer, path suspicious for adenocarcinoma.  Oncology and Surgery following   Principal Problem:   Common bile duct obstruction Active Problems:   Lynch syndrome   Renal cell carcinoma (HCC)   NHL (non-Hodgkin's lymphoma) (HCC)   Obesity   B12 deficiency   History of colon cancer   Renal stones   Choledocholithiasis   Common bile duct dilatation   Duodenal ulcer   Elevated liver enzymes   Duodenal adenocarcinoma (HCC)   PLAN   Patient is stable, not in need of emergent biliary procedure. She could possibly go home later today as we are actively trying to expedite an outpatient EUS , +/- ERCP with [facility]    Subjective   Feels okay today. No significant abdominal pain    Objective   GI Studies:    Recent Labs    10/07/23 0403 10/08/23 0224  WBC 6.1 7.4  HGB 11.9* 11.6*  HCT 36.9 36.2  MCV 88.3 88.1  PLT 180 181   Recent Labs    10/06/23 0358 10/07/23 0403 10/08/23 0224  NA 140 139 137  K 4.6 4.5 4.4  CL 108 105 104  CO2 24 25 25   GLUCOSE 113* 111* 111*  BUN 21 20 18   CREATININE 1.10* 0.93 0.95  CALCIUM 9.2 9.1 8.8*   Recent Labs    10/06/23 0358 10/07/23 0403 10/08/23 0224  PROT 5.7* 5.7* 5.6*  ALBUMIN 2.7* 2.8* 2.7*  AST 43* 39 49*  ALT  64* 67* 66*  ALKPHOS 165* 187* 196*  BILITOT 0.4 0.5 0.7    Imaging:  CT CHEST W CONTRAST EXAM: CT CHEST WITH CONTRAST 10/07/2023 11:24:31 PM  TECHNIQUE: CT of the chest was performed with the administration of intravenous contrast (75 mL iohexol  (OMNIPAQUE ) 350 MG/ML injection). Multiplanar reformatted images are provided for review. Automated exposure control, iterative reconstruction, and/or weight based adjustment of the mA/kV was utilized to reduce the radiation dose to as low as reasonably achievable.  COMPARISON: None available.  CLINICAL  HISTORY: Small bowel cancer, staging.  FINDINGS:  MEDIASTINUM: Mild thoracic aortic atherosclerosis. Mild 3 vessel coronary atherosclerosis. Small mediastinal nodes, including a dominant 11 mm low right paratracheal node (image 67) with preservation of the normal fatty hilum, likely reactive.  LYMPH NODES: Mild bilateral axillary lymphadenopathy, measuring up to 12 mm on the left (image 45) and 18 mm short axis on the right (image 67). This appearance may be reactive but warrants attention to follow up in the setting of known malignancy.  LUNGS AND PLEURA: 3 mm triangular subpleural nodule in the anterior left upper lobe (image 9), likely benign. Clustered left basilar nodules measuring up to 6 mm (image 102), likely infectious or inflammatory. 3 mm right lower lobe nodule (image 118), likely benign.  SOFT TISSUES/BONES: Moderate degenerative changes of the mid/lower thoracic spine.  UPPER ABDOMEN: Visualized upper abdomen is unchanged from recent CT evidence of pelvis.  IMPRESSION: 1. Scattered small bilateral pulmonary nodules, measuring up to 6 mm in the left lower lobe, likely benign. 2. Mild bilateral axillary lymphadenopathy, possibly reactive, but warranting attention on follow-up. 3. In the setting of known malignancy, follow-up CT chest is suggested in 3-6 months.  Electronically signed by: Pinkie Pebbles MD 10/07/2023 11:42 PM EDT RP Workstation: [id number]     Scheduled inpatient medications:   cephALEXin   500 mg Oral Q8H   diclofenac   100 mg Rectal Once   magnesium  oxide  400 mg Oral Daily   metroNIDAZOLE   500 mg Oral Q12H   pantoprazole   40 mg Oral BID AC   polyethylene glycol  17 g Oral BID   potassium citrate   10 mEq Oral TID   senna-docusate  1 tablet Oral BID   Continuous inpatient infusions:  PRN inpatient medications: acetaminophen , diphenoxylate -atropine , ondansetron  (ZOFRAN ) IV, traMADol   Vital signs in last 24 hours: Temp:  [97.6 F (36.4  C)-98.6 F (37 C)] 97.9 F (36.6 C) (07/23 0816) Pulse Rate:  [70-90] 70 (07/23 0816) Resp:  [16-20] 16 (07/23 0816) BP: (112-149)/(55-86) 132/73 (07/23 0816) SpO2:  [95 %-100 %] 99 % (07/23 0816) Last BM Date : 10/06/23  Intake/Output Summary (Last 24 hours) at 10/08/2023 1014 Last data filed at 10/08/2023 0200 Gross per 24 hour  Intake 800 ml  Output --  Net 800 ml    Intake/Output from previous day: 07/22 0701 - 07/23 0700 In: 800 [P.O.:800] Out: -  Intake/Output this shift: No intake/output data recorded.   Physical Exam:  General: Alert female in NAD Heart:  Regular rate and rhythm.  Pulmonary: Normal respiratory effort Abdomen: Soft, nondistended, nontender. Normal bowel sounds. Extremities: No lower extremity edema  Neurologic: Alert and oriented Psych: Pleasant. Cooperative     LOS: 7 days   Vina Dasen ,NP 10/08/2023, 10:14 AM

## 2023-10-08 NOTE — Progress Notes (Incomplete)
 {  Select_TRH_Note:[id number]}

## 2023-10-08 NOTE — Telephone Encounter (Addendum)
-----   Message from [facility] sent at 10/08/2023  8:17 AM EDT ----- Regarding: Urgent referral for EUS +/_ ERCP Please send urgent referral to [facility] to see if any of their doctors can get this patient soon for EUS +/- ERCP for ampullary adenoca with biliary obstruction.  She had failed ERCP here and Dr Wilhelmenia said he doesn't have availability to do EUS any time soon. She is not jaundiced, stable, currently inpatient but we can discharge her home once we have a plan in place regarding outpatient follow up and management.  Thank you VN

## 2023-10-08 NOTE — Telephone Encounter (Signed)
 Records faxed. Spoke with Dr Shila. Patient may be discharged to home today.

## 2023-10-08 NOTE — Discharge Summary (Signed)
 Physician Discharge Summary   Patient: Nicole [name] MRN: [id number] DOB: 1950-11-29  Admit date:     10/01/2023  Discharge date: 10/08/23  Discharge Physician: AIDA CHO   PCP: [name] [name], FNP   Recommendations at discharge:  {Tip this will not be part of the note when signed- Example include specific recommendations for outpatient follow-up, pending tests to follow-up on. (Optional):26781} Follow-up with PCP in 1 week Follow-up with Dr. Ortencia, gastroenterologist. Gastroenterology is arranging urgent follow-up for EUS plus ERCP at [facility].  Patient will be updated by GI team about follow-up plans.  Discharge Diagnoses: Principal Problem:   Common bile duct obstruction Active Problems:   Lynch syndrome   Renal cell carcinoma (HCC)   NHL (non-Hodgkin's lymphoma) (HCC)   Obesity   B12 deficiency   History of colon cancer   Renal stones   Choledocholithiasis   Common bile duct dilatation   Duodenal ulcer   Elevated liver enzymes   Duodenal adenocarcinoma (HCC)   Anemia in neoplastic disease  Resolved Problems:   * No resolved hospital problems. [facility] Care Course:  Nicole [name] is a 73 year old female with history of Lynch syndrome, colon CA x2 s/p resection, renal cancer, skin CA, non-Hodkin's lymphoma, B12 deficiency who presented to the ED from GI office due to epigastric pain.  Initial work up on 7/8 with ultrasound revealed a dilated CBD (1.7cm) with intrahepatic dilatation and increased hepatic echogenicity. MRCP was recommended. When she presented to GI clinic on 7/16, she reported intermittent fevers as well.  She was sent to the ED where she has been hypertensive and afebrile (Tmax 12F). WBC normal, TBili 0.6, alkaline phosphatase 391, AST 79, ALT 176. MRCP showed a distended gallbladder with numerous stones, severe intra- and extra-hepatic ductal dilatation with CBD measuring 1.9cm with dependent gallstones. There appears to be tapering to the ampulla  without a stone so ?stricture vs. occult mass.  Patient was transferred to [facility] for ERCP.   Assessment and Plan:  Common bile duct obstruction, choledocholithiasis, early cholangitis Duodenal ulcer, path + adeno CA I was informed by Vina, NP, with GI, [facility] is trying to get an appointment as soon as they can.  In the meantime, they prefer that she stay on antibiotics (Keflex  and Flagyl ) until follow-up. I have prescribed 7 days of Keflex  and Flagyl  and send a prescription to [facility] pharmacy in [place], per patient's request  - EGD 7/18 showed large periampullary ulcer, biopsies taken concern about duodenal versus ampullary cancer with Lynch syndrome however she has been taking high amount of ibuprofen  with no PPI.  IR versus attempt to get EUS guided bile duct access, not urgent.  ERCP unsuccessful - CEA 2.4, CA 19-9 13 - CT abdomen showed numerous gallstones and evidence of choledocholithiasis with dilated biliary tree.  Some thickening in the area of the pancreatic head with small cystic area and narrowing of the distal common duct at the level of the ampulla, recommend further evaluation to exclude ampullary lesion GI is arranging urgent follow-up with [facility] for EUS/ERCP.  Patient to be updated with follow-up information after discharge from the hospital by GI team.  Discussed with Dr. Shila, gastroenterologist. She prefers to follow-up with oncologist at [facility].  Information for Dr. Davonna, oncologist, has been provided.     E. coli urinary tract infection - Urine culture with more than 100,000 colonies of E. coli She was significantly treated with IV Zosyn  and was switched to Keflex  and Flagyl .  Lynch syndrome, history of colon CA x2 -Continue outpatient follow-up - Patient was following with Dr. Dorsey (oncology, leaving practice) and GI.  She will follow-up with oncologist at [facility]       Renal cell carcinoma [facility]),  - Has history of left partial nephrectomy in  03/2005, pathology revealed stage T1a clear-cell. She had recurrent renal cell CA in 2020 and was treated with cryoablation on 05/20/2018  - has been following urology outpatient     NHL (non-Hodgkin's lymphoma) [facility]) -Followed Dr. Rogers, per last note, retroperitoneal lymphadenopathy stable, no B symptoms. - Oncology has been consulted.     B12 deficiency - Continue B12 injections every 3 months   History of nephrolithiasis - Resumed potassium citrate  per patient's request        {Tip this will not be part of the note when signed Body mass index is 36.46 kg/m. , ,  (Optional):26781}   Consultants: Solicitor, oncologist, general surgeon Procedures performed: EGD Disposition: Home Diet recommendation:  Discharge Diet Orders (From admission, onward)     Start     Ordered   10/08/23 0000  Diet - low sodium heart healthy        10/08/23 1258           Cardiac diet DISCHARGE MEDICATION: Allergies as of 10/08/2023       Reactions   Latex Rash   Morphine And Codeine Other (See Comments)   Hallucinations    Tape Rash   Certain tapes         Medication List     STOP taking these medications    ibuprofen  800 MG tablet Commonly known as: ADVIL        TAKE these medications    acetaminophen  500 MG tablet Commonly known as: TYLENOL  Take 1,000 mg by mouth every 8 (eight) hours as needed for fever.   BIOTIN  PO Take 1 tablet by mouth daily.   cephALEXin  500 MG capsule Commonly known as: KEFLEX  Take 1 capsule (500 mg total) by mouth every 8 (eight) hours for 7 days.   cyanocobalamin  1000 MCG/ML injection Commonly known as: VITAMIN B12 ADMINISTER 1 ML(1000 MCG) IN THE MUSCLE EVERY 21 DAYS   diphenoxylate -atropine  2.5-0.025 MG tablet Commonly known as: LOMOTIL  Take 1 tablet by mouth 4 (four) times daily as needed for diarrhea or loose stools.   furosemide  40 MG tablet Commonly known as: LASIX  Take 40 mg by mouth daily as needed for fluid.    magnesium  oxide 400 (240 Mg) MG tablet Commonly known as: MAG-OX Take 400 mg by mouth daily.   metroNIDAZOLE  500 MG tablet Commonly known as: FLAGYL  Take 1 tablet (500 mg total) by mouth every 12 (twelve) hours for 7 days.   Potassium Citrate  15 MEQ (1620 MG) Tbcr Take 1 tablet by mouth 3 (three) times daily.   Vitamin D3 125 MCG (5000 UT) Caps Take 5,000 Units by mouth daily.        Follow-up Information     Nandigam, Kavitha V, MD. Schedule an appointment as soon as possible for a visit in 1 week(s).   Specialty: Gastroenterology Contact information: [address] [place] [address] [phone]         Davonna Siad, MD. Schedule an appointment as soon as possible for a visit in 1 week(s).   Specialty: Oncology Contact information: [address] [place] [address] [phone]                Discharge Exam: Nicole [name]  10/01/23 1209  Weight: 86.1 kg   ***  Condition at discharge: good  The results of significant diagnostics from this hospitalization (including imaging, microbiology, ancillary and laboratory) are listed below for reference.   Imaging Studies: CT CHEST W CONTRAST Result Date: 10/07/2023 EXAM: CT CHEST WITH CONTRAST 10/07/2023 11:24:31 PM TECHNIQUE: CT of the chest was performed with the administration of intravenous contrast (75 mL iohexol  (OMNIPAQUE ) 350 MG/ML injection). Multiplanar reformatted images are provided for review. Automated exposure control, iterative reconstruction, and/or weight based adjustment of the mA/kV was utilized to reduce the radiation dose to as low as reasonably achievable. COMPARISON: None available. CLINICAL HISTORY: Small bowel cancer, staging. FINDINGS: MEDIASTINUM: Mild thoracic aortic atherosclerosis. Mild 3 vessel coronary atherosclerosis. Small mediastinal nodes, including a dominant 11 mm low right paratracheal node (image 67) with preservation of the normal fatty hilum, likely reactive.  LYMPH NODES: Mild bilateral axillary lymphadenopathy, measuring up to 12 mm on the left (image 45) and 18 mm short axis on the right (image 67). This appearance may be reactive but warrants attention to follow up in the setting of known malignancy. LUNGS AND PLEURA: 3 mm triangular subpleural nodule in the anterior left upper lobe (image 9), likely benign. Clustered left basilar nodules measuring up to 6 mm (image 102), likely infectious or inflammatory. 3 mm right lower lobe nodule (image 118), likely benign. SOFT TISSUES/BONES: Moderate degenerative changes of the mid/lower thoracic spine. UPPER ABDOMEN: Visualized upper abdomen is unchanged from recent CT evidence of pelvis. IMPRESSION: 1. Scattered small bilateral pulmonary nodules, measuring up to 6 mm in the left lower lobe, likely benign. 2. Mild bilateral axillary lymphadenopathy, possibly reactive, but warranting attention on follow-up. 3. In the setting of known malignancy, follow-up CT chest is suggested in 3-6 months. Electronically signed by: Pinkie Pebbles MD 10/07/2023 11:42 PM EDT RP Workstation: [id number]   CT ABDOMEN PELVIS W CONTRAST Result Date: 10/04/2023 CLINICAL DATA:  Duodenal ulcer. Lynch syndrome. Ascending colon cancer history. EXAM: CT ABDOMEN AND PELVIS WITH CONTRAST TECHNIQUE: Multidetector CT imaging of the abdomen and pelvis was performed using the standard protocol following bolus administration of intravenous contrast. RADIATION DOSE REDUCTION: This exam was performed according to the departmental dose-optimization program which includes automated exposure control, adjustment of the mA and/or kV according to patient size and/or use of iterative reconstruction technique. CONTRAST:  75mL OMNIPAQUE  IOHEXOL  350 MG/ML SOLN COMPARISON:  CT 10/30/2022. MRI abdomen 10/01/2023. Abdominal ultrasound 09/23/2023 FINDINGS: Lower chest: There is some linear opacity lung bases likely scar or atelectasis. Areas of bronchiectasis along the  medial right lower lobe, unchanged from previous. Some tree-in-bud reticulonodular changes identified along the extreme inferior aspect of the middle lobe, nonspecific. Small calcification left lower lobe image 45 of series 5. Hepatobiliary: Dilated biliary tree identified. Increased from the study of 2024. On recent MRI the dilatation with similar. Several filling defects in the distal common duct on the prior MRI. There are several seen today but there seen on the previous MRCP. Dilated gallbladder as well with numerous stones. Please correlate with history. Once again there is some mucosal thickening and enhancement along the distal common duct with common abrupt taper appearance at the ampulla. No space-occupying liver lesion. Patent portal vein. Pancreas: No pancreatic ductal dilatation or pancreatic atrophy. Small cystic area identified adjacent to the common duct is stable on image 35. Please correlate with prior MRCP. Spleen: Normal in size without focal abnormality. Adrenals/Urinary Tract: Adrenal glands are preserved. Small Bosniak 1 right-sided anterior renal cyst as  seen on previous examinations. No enhancing right-sided renal mass. Left kidney has focal areas of severe atrophy with calcifications towards the lower pole and distortion. Adjacent perinephric fat stranding and thickening. Please correlate with known history and any prior intervention. Change were seen on the study of 2024 as well. Stomach/Bowel: Surgical changes of subtotal colectomy with primary anastomosis in the left hemipelvis. Oral contrast was administered. Overall the bowel is nondilated. No abnormal soft tissue at the surgical anastomosis. Scattered clips throughout the mesentery as well. Vascular/Lymphatic: Normal caliber aorta and IVC. Scattered vascular calcifications. Several enlarged retroperitoneal and retrocrural nodes identified. Example left periaortic on image 33 of series 3 measures 2.5 x 1.5 cm. In August 2024 this same  node measured 2.3 by 1.4 cm. Additional nodes included retrocaval on image 35 today measuring 2.9 by 1.5 cm and going back to August 2024 2.7 x 1.4 cm. Other nodes are similar as well. Reproductive: Status post hysterectomy. No adnexal masses. Other: No free air or free fluid. Musculoskeletal: Scattered degenerative changes. Curvature of the spine IMPRESSION: Again there are numerous gallstones and evidence of choledocholithiasis with a dilated biliary tree similar to the recent MRI. There is some thickening in the area of the pancreatic head with a small cystic area and narrowing of the distal common duct at the level of the ampulla with some mucosal thickening. Recommend further evaluation to exclude an ampullary lesion when clinically appropriate. Surgical changes of subtotal colectomy. Stable multifocal atrophy of the left kidney with distortion and stones. Stable numerous enlarged lymph nodes in the retroperitoneum and retrocrural spaces going back to August 2024. Please correlate for known history. Electronically Signed   By: Ranell Bring M.D.   On: 10/04/2023 14:39   MR ABDOMEN MRCP W WO CONTAST Result Date: 10/01/2023 CLINICAL DATA:  Cholelithiasis history of ascending colon cancer EXAM: MRI ABDOMEN WITHOUT AND WITH CONTRAST (INCLUDING MRCP) TECHNIQUE: Multiplanar multisequence MR imaging of the abdomen was performed both before and after the administration of intravenous contrast. Heavily T2-weighted images of the biliary and pancreatic ducts were obtained, and three-dimensional MRCP images were rendered by post processing. CONTRAST:  9mL GADAVIST  GADOBUTROL  1 MMOL/ML IV SOLN COMPARISON:  Right upper quadrant ultrasound, 09/23/2023 CT abdomen pelvis, 10/30/2022 FINDINGS: Lower chest: No acute abnormality. Hepatobiliary: No solid liver abnormality is seen. Distended gallbladder containing numerous gallstones. Severe intra and extrahepatic biliary ductal dilatation, the common bile duct measuring up to 1.9  cm in caliber and containing multiple small gallstones dependently (series 6, image 21). The duct however appears to taper to the ampulla without calculus of the ampulla (series 5, image 15). Pancreas: Unremarkable. No pancreatic ductal dilatation or surrounding inflammatory changes. Spleen: Normal in size without significant abnormality. Adrenals/Urinary Tract: Adrenal glands are unremarkable. Unchanged extensive left renal cortical scarring. Right kidney is normal, without obvious renal calculi, solid lesion, or hydronephrosis. Stomach/Bowel: Stomach is within normal limits. No evidence of bowel wall thickening, distention, or inflammatory changes. Status post partial right hemicolectomy. Vascular/Lymphatic: Aortic atherosclerosis. Enlarged retroperitoneal lymph nodes unchanged when compared to examination dated 10/30/2022, left retroperitoneal nodes measuring up 2 1.7 x 1.2 cm (series 17, image 51). Other: No abdominal wall hernia or abnormality. No ascites. Musculoskeletal: No acute or significant osseous findings. IMPRESSION: 1. Distended gallbladder containing numerous gallstones. 2. Severe intra and extrahepatic biliary ductal dilatation, the common bile duct measuring up to 1.9 cm in caliber and containing multiple small gallstones dependently. The duct however appears to taper to the ampulla without calculus of the ampulla.  Findings are highly suspicious for ampullary stricture or perhaps an occult mass. Notably, biliary ductal dilatation is new when compared to relatively recent prior CT dated 10/30/2022. Consider ERCP for further assessment. 3. Enlarged retroperitoneal lymph nodes unchanged when compared to examination dated 10/30/2022, left retroperitoneal nodes measuring up to 1.7 x 1.2 cm. 4. Status post partial right hemicolectomy. 5. Unchanged extensive left renal cortical scarring. Electronically Signed   By: Marolyn JONETTA Jaksch M.D.   On: 10/01/2023 16:01   MR 3D Recon At Scanner Result Date:  10/01/2023 CLINICAL DATA:  Cholelithiasis history of ascending colon cancer EXAM: MRI ABDOMEN WITHOUT AND WITH CONTRAST (INCLUDING MRCP) TECHNIQUE: Multiplanar multisequence MR imaging of the abdomen was performed both before and after the administration of intravenous contrast. Heavily T2-weighted images of the biliary and pancreatic ducts were obtained, and three-dimensional MRCP images were rendered by post processing. CONTRAST:  9mL GADAVIST  GADOBUTROL  1 MMOL/ML IV SOLN COMPARISON:  Right upper quadrant ultrasound, 09/23/2023 CT abdomen pelvis, 10/30/2022 FINDINGS: Lower chest: No acute abnormality. Hepatobiliary: No solid liver abnormality is seen. Distended gallbladder containing numerous gallstones. Severe intra and extrahepatic biliary ductal dilatation, the common bile duct measuring up to 1.9 cm in caliber and containing multiple small gallstones dependently (series 6, image 21). The duct however appears to taper to the ampulla without calculus of the ampulla (series 5, image 15). Pancreas: Unremarkable. No pancreatic ductal dilatation or surrounding inflammatory changes. Spleen: Normal in size without significant abnormality. Adrenals/Urinary Tract: Adrenal glands are unremarkable. Unchanged extensive left renal cortical scarring. Right kidney is normal, without obvious renal calculi, solid lesion, or hydronephrosis. Stomach/Bowel: Stomach is within normal limits. No evidence of bowel wall thickening, distention, or inflammatory changes. Status post partial right hemicolectomy. Vascular/Lymphatic: Aortic atherosclerosis. Enlarged retroperitoneal lymph nodes unchanged when compared to examination dated 10/30/2022, left retroperitoneal nodes measuring up 2 1.7 x 1.2 cm (series 17, image 51). Other: No abdominal wall hernia or abnormality. No ascites. Musculoskeletal: No acute or significant osseous findings. IMPRESSION: 1. Distended gallbladder containing numerous gallstones. 2. Severe intra and  extrahepatic biliary ductal dilatation, the common bile duct measuring up to 1.9 cm in caliber and containing multiple small gallstones dependently. The duct however appears to taper to the ampulla without calculus of the ampulla. Findings are highly suspicious for ampullary stricture or perhaps an occult mass. Notably, biliary ductal dilatation is new when compared to relatively recent prior CT dated 10/30/2022. Consider ERCP for further assessment. 3. Enlarged retroperitoneal lymph nodes unchanged when compared to examination dated 10/30/2022, left retroperitoneal nodes measuring up to 1.7 x 1.2 cm. 4. Status post partial right hemicolectomy. 5. Unchanged extensive left renal cortical scarring. Electronically Signed   By: Marolyn JONETTA Jaksch M.D.   On: 10/01/2023 16:01   US  Abdomen Complete Result Date: 09/23/2023 CLINICAL DATA:  Abdominal pain EXAM: ABDOMEN ULTRASOUND COMPLETE COMPARISON:  CT 10/30/2022 FINDINGS: Gallbladder: Multiple echogenic, shadowing stones within the gallbladder measuring to 1.7 cm in diameter. No gallbladder wall thickening. No pericholecystic fluid. No Murphy sign reported on exam. Common bile duct: Diameter: 1.7 cm. Liver: No focal lesion identified. Mildly increased hepatic parenchymal echogenicity. Mild intrahepatic biliary dilatation. Portal vein is patent on color Doppler imaging with normal direction of blood flow towards the liver. IVC: No abnormality visualized. Pancreas: Visualized portion unremarkable. Spleen: Size and appearance within normal limits. Right Kidney: Length: 12.1 cm. Echogenicity within normal limits. No mass or hydronephrosis visualized. Left Kidney: Length: 11.1 cm. Echogenicity within normal limits. 1.1 cm shadowing stone within the left kidney. No  mass or hydronephrosis visualized. Abdominal aorta: No aneurysm visualized. Other findings: None. IMPRESSION: 1. Cholelithiasis without sonographic evidence of acute cholecystitis. 2. Dilated common bile duct is  abnormally dilated measuring up to 1.7 cm with mild intrahepatic biliary dilatation. Recommend further evaluation with MRCP. 3. Mildly increased hepatic parenchymal echogenicity, which is nonspecific but can be seen in the setting of hepatic steatosis. 4. Nonobstructing left renal stone. Electronically Signed   By: Mabel Converse D.O.   On: 09/23/2023 10:54    Microbiology: Results for orders placed or performed during the hospital encounter of 10/01/23  Urine Culture     Status: Abnormal   Collection Time: 10/01/23 12:30 PM   Specimen: Urine, Clean Catch  Result Value Ref Range Status   Specimen Description   Final    URINE, CLEAN CATCH Performed at [facility], [address] [place], [place] [address]    Special Requests   Final    NONE Performed at [facility], [address], [place], [place] [address]    Culture (A)  Final    >=100,000 COLONIES/mL ESCHERICHIA COLI 90,000 COLONIES/mL ESCHERICHIA COLI    Report Status 10/05/2023 FINAL  Final   Organism ID, Bacteria ESCHERICHIA COLI (A)  Final   Organism ID, Bacteria ESCHERICHIA COLI (A)  Final      Susceptibility   Escherichia coli - MIC*    AMPICILLIN 16 INTERMEDIATE Intermediate     CEFAZOLIN  <=4 SENSITIVE Sensitive     CEFTRIAXONE  <=0.25 SENSITIVE Sensitive     CIPROFLOXACIN  2 RESISTANT Resistant     GENTAMICIN  <=1 SENSITIVE Sensitive     IMIPENEM <=0.25 SENSITIVE Sensitive     NITROFURANTOIN 32 SENSITIVE Sensitive     TRIMETH/SULFA <=20 SENSITIVE Sensitive     AMPICILLIN/SULBACTAM 16 INTERMEDIATE Intermediate    Escherichia coli - MIC*    AMPICILLIN 16 INTERMEDIATE Intermediate     CEFAZOLIN  <=4 SENSITIVE Sensitive     CEFEPIME <=0.12 SENSITIVE Sensitive     CEFTRIAXONE  <=0.25 SENSITIVE Sensitive     CIPROFLOXACIN  1 RESISTANT Resistant     GENTAMICIN  <=1 SENSITIVE Sensitive     IMIPENEM <=0.25 SENSITIVE Sensitive     NITROFURANTOIN <=16 SENSITIVE Sensitive     TRIMETH/SULFA <=20 SENSITIVE Sensitive      AMPICILLIN/SULBACTAM 4 SENSITIVE Sensitive     PIP/TAZO <=4 SENSITIVE Sensitive ug/mL    * >=100,000 COLONIES/mL ESCHERICHIA COLI    90,000 COLONIES/mL ESCHERICHIA COLI    Labs: CBC: Recent Labs  Lab 10/03/23 0233 10/04/23 0241 10/05/23 0300 10/07/23 0403 10/08/23 0224  WBC 6.9 8.9 8.0 6.1 7.4  HGB 11.4* 11.7* 11.0* 11.9* 11.6*  HCT 35.7* 36.8 34.4* 36.9 36.2  MCV 89.3 88.9 89.1 88.3 88.1  PLT 180 210 187 180 181   Basic Metabolic Panel: Recent Labs  Lab 10/04/23 0241 10/05/23 0300 10/06/23 0358 10/07/23 0403 10/08/23 0224  NA 138 140 140 139 137  K 4.3 4.1 4.6 4.5 4.4  CL 107 109 108 105 104  CO2 20* 20* 24 25 25   GLUCOSE 128* 105* 113* 111* 111*  BUN 19 22 21 20 18   CREATININE 0.84 0.89 1.10* 0.93 0.95  CALCIUM 8.9 8.7* 9.2 9.1 8.8*   Liver Function Tests: Recent Labs  Lab 10/04/23 0241 10/05/23 0300 10/06/23 0358 10/07/23 0403 10/08/23 0224  AST 29 26 43* 39 49*  ALT 78* 60* 64* 67* 66*  ALKPHOS 214* 162* 165* 187* 196*  BILITOT 0.6 0.4 0.4 0.5 0.7  PROT 6.3* 5.7* 5.7* 5.7* 5.6*  ALBUMIN  2.9* 2.7* 2.7* 2.8* 2.7*   CBG: No results for input(s): GLUCAP in the last 168 hours.  Discharge time spent: greater than 30 minutes.  Signed: AIDA CHO, MD [facility] 10/08/2023

## 2023-10-09 ENCOUNTER — Telehealth: Payer: Self-pay | Admitting: Hematology

## 2023-10-09 NOTE — Telephone Encounter (Signed)
 Scheduled appointment per staff message. Talked with the patient and she is aware of the made appointment.

## 2023-10-13 ENCOUNTER — Telehealth: Payer: Self-pay | Admitting: Gastroenterology

## 2023-10-13 ENCOUNTER — Telehealth: Payer: Self-pay

## 2023-10-13 NOTE — Telephone Encounter (Signed)
 Spoke with the patient. She has her appointment with oncology and with [facility], Dr Minnie for her ERCP. She is looking at her discharge papers and reads that she is to also follow up with GI in a week. Please advise.

## 2023-10-13 NOTE — Telephone Encounter (Signed)
 Transitions of Care 10/01/2023 - 10/08/2023 (7 days) [facility]  Pt states she is doing better. Pt states she is still taking  antibiotic. Pt states she has and ulcer and they think it may be malignant. Pt had ERCP at [facility]. Pt states she is able to take care of self and is not having any issues. Pt has f/u appt with Amber on 10/21/2023 @ 3:45 and pt is aware. Mjp,lpn

## 2023-10-13 NOTE — Telephone Encounter (Signed)
 Inbound call from patient stating she has instructions to be scheduled with Dr.Nandigam in office within a week of being discharged. Patient did not want to be scheduled for her next available date in October. Wants to know if a nurse can schedule her sooner due to her problems  Requested a call back   Please advise  Thank you

## 2023-10-14 DIAGNOSIS — C241 Malignant neoplasm of ampulla of Vater: Secondary | ICD-10-CM | POA: Insufficient documentation

## 2023-10-14 NOTE — Assessment & Plan Note (Signed)
 [id number], MMR pending - Patient presented with abdominal pain.  EGD showed an ulcerated mass at his ampulla, biopsy showed atypical cells suspicious for adenocarcinoma.  - Staging CT scan was negative for definitive metastasis, except multiple small lung nodules up to 6 mm, slightly enlarged hilar nodes likely reactive, and stable abdominal pelvis adenopathy likely related to her follicular lymphoma. -I recommend PET scan for staging. -Patient has Lynch syndrome, if her tumor has MMR deficiency, I recommend immunotherapy with Keytruda and we will hold surgery for now.

## 2023-10-15 ENCOUNTER — Inpatient Hospital Stay: Attending: Hematology | Admitting: Hematology

## 2023-10-15 ENCOUNTER — Other Ambulatory Visit: Payer: Self-pay

## 2023-10-15 DIAGNOSIS — Z1509 Genetic susceptibility to other malignant neoplasm: Secondary | ICD-10-CM | POA: Insufficient documentation

## 2023-10-15 DIAGNOSIS — Z85038 Personal history of other malignant neoplasm of large intestine: Secondary | ICD-10-CM | POA: Insufficient documentation

## 2023-10-15 DIAGNOSIS — C241 Malignant neoplasm of ampulla of Vater: Secondary | ICD-10-CM | POA: Diagnosis not present

## 2023-10-15 DIAGNOSIS — C8290 Follicular lymphoma, unspecified, unspecified site: Secondary | ICD-10-CM | POA: Insufficient documentation

## 2023-10-16 ENCOUNTER — Other Ambulatory Visit: Payer: Self-pay

## 2023-10-16 ENCOUNTER — Encounter (HOSPITAL_COMMUNITY): Payer: Self-pay | Admitting: Hematology

## 2023-10-16 DIAGNOSIS — K8051 Calculus of bile duct without cholangitis or cholecystitis with obstruction: Secondary | ICD-10-CM | POA: Diagnosis not present

## 2023-10-16 DIAGNOSIS — K8050 Calculus of bile duct without cholangitis or cholecystitis without obstruction: Secondary | ICD-10-CM | POA: Diagnosis not present

## 2023-10-16 DIAGNOSIS — K828 Other specified diseases of gallbladder: Secondary | ICD-10-CM | POA: Diagnosis not present

## 2023-10-16 DIAGNOSIS — K838 Other specified diseases of biliary tract: Secondary | ICD-10-CM | POA: Diagnosis not present

## 2023-10-16 DIAGNOSIS — C241 Malignant neoplasm of ampulla of Vater: Secondary | ICD-10-CM | POA: Diagnosis not present

## 2023-10-16 LAB — SURGICAL PATHOLOGY

## 2023-10-16 NOTE — Progress Notes (Signed)
 The proposed treatment discussed in conference is for discussion purpose only and is not a binding recommendation.  The patients have not been physically examined, or presented with their treatment options.  Therefore, final treatment plans cannot be decided.

## 2023-10-17 ENCOUNTER — Other Ambulatory Visit: Payer: Self-pay

## 2023-10-17 ENCOUNTER — Encounter (HOSPITAL_COMMUNITY): Payer: Self-pay

## 2023-10-17 ENCOUNTER — Emergency Department (HOSPITAL_COMMUNITY)

## 2023-10-17 ENCOUNTER — Emergency Department (HOSPITAL_COMMUNITY)
Admission: EM | Admit: 2023-10-17 | Discharge: 2023-10-17 | Attending: Emergency Medicine | Admitting: Emergency Medicine

## 2023-10-17 DIAGNOSIS — R1013 Epigastric pain: Secondary | ICD-10-CM | POA: Diagnosis not present

## 2023-10-17 DIAGNOSIS — Z79899 Other long term (current) drug therapy: Secondary | ICD-10-CM | POA: Diagnosis not present

## 2023-10-17 DIAGNOSIS — K668 Other specified disorders of peritoneum: Secondary | ICD-10-CM | POA: Diagnosis not present

## 2023-10-17 DIAGNOSIS — G8918 Other acute postprocedural pain: Secondary | ICD-10-CM | POA: Diagnosis not present

## 2023-10-17 DIAGNOSIS — Z85528 Personal history of other malignant neoplasm of kidney: Secondary | ICD-10-CM | POA: Diagnosis not present

## 2023-10-17 DIAGNOSIS — E538 Deficiency of other specified B group vitamins: Secondary | ICD-10-CM | POA: Diagnosis not present

## 2023-10-17 DIAGNOSIS — C241 Malignant neoplasm of ampulla of Vater: Secondary | ICD-10-CM | POA: Diagnosis not present

## 2023-10-17 DIAGNOSIS — T85520A Displacement of bile duct prosthesis, initial encounter: Secondary | ICD-10-CM | POA: Diagnosis not present

## 2023-10-17 DIAGNOSIS — K9189 Other postprocedural complications and disorders of digestive system: Secondary | ICD-10-CM | POA: Diagnosis not present

## 2023-10-17 DIAGNOSIS — R935 Abnormal findings on diagnostic imaging of other abdominal regions, including retroperitoneum: Secondary | ICD-10-CM | POA: Diagnosis not present

## 2023-10-17 DIAGNOSIS — R590 Localized enlarged lymph nodes: Secondary | ICD-10-CM | POA: Diagnosis not present

## 2023-10-17 DIAGNOSIS — K838 Other specified diseases of biliary tract: Secondary | ICD-10-CM | POA: Diagnosis not present

## 2023-10-17 DIAGNOSIS — Z85038 Personal history of other malignant neoplasm of large intestine: Secondary | ICD-10-CM | POA: Insufficient documentation

## 2023-10-17 DIAGNOSIS — C240 Malignant neoplasm of extrahepatic bile duct: Secondary | ICD-10-CM | POA: Diagnosis not present

## 2023-10-17 DIAGNOSIS — Z9689 Presence of other specified functional implants: Secondary | ICD-10-CM | POA: Diagnosis not present

## 2023-10-17 DIAGNOSIS — K8020 Calculus of gallbladder without cholecystitis without obstruction: Secondary | ICD-10-CM | POA: Diagnosis not present

## 2023-10-17 DIAGNOSIS — R109 Unspecified abdominal pain: Secondary | ICD-10-CM | POA: Diagnosis not present

## 2023-10-17 DIAGNOSIS — R188 Other ascites: Secondary | ICD-10-CM | POA: Diagnosis not present

## 2023-10-17 DIAGNOSIS — Z8572 Personal history of non-Hodgkin lymphomas: Secondary | ICD-10-CM | POA: Insufficient documentation

## 2023-10-17 DIAGNOSIS — Z87442 Personal history of urinary calculi: Secondary | ICD-10-CM | POA: Diagnosis not present

## 2023-10-17 DIAGNOSIS — D72829 Elevated white blood cell count, unspecified: Secondary | ICD-10-CM | POA: Diagnosis not present

## 2023-10-17 DIAGNOSIS — Z85828 Personal history of other malignant neoplasm of skin: Secondary | ICD-10-CM | POA: Insufficient documentation

## 2023-10-17 DIAGNOSIS — R0989 Other specified symptoms and signs involving the circulatory and respiratory systems: Secondary | ICD-10-CM | POA: Diagnosis not present

## 2023-10-17 DIAGNOSIS — Z91040 Latex allergy status: Secondary | ICD-10-CM | POA: Insufficient documentation

## 2023-10-17 DIAGNOSIS — R1010 Upper abdominal pain, unspecified: Secondary | ICD-10-CM | POA: Diagnosis not present

## 2023-10-17 DIAGNOSIS — R932 Abnormal findings on diagnostic imaging of liver and biliary tract: Secondary | ICD-10-CM | POA: Diagnosis not present

## 2023-10-17 LAB — COMPREHENSIVE METABOLIC PANEL WITH GFR
ALT: 141 U/L — ABNORMAL HIGH (ref 0–44)
AST: 71 U/L — ABNORMAL HIGH (ref 15–41)
Albumin: 3.4 g/dL — ABNORMAL LOW (ref 3.5–5.0)
Alkaline Phosphatase: 338 U/L — ABNORMAL HIGH (ref 38–126)
Anion gap: 13 (ref 5–15)
BUN: 16 mg/dL (ref 8–23)
CO2: 22 mmol/L (ref 22–32)
Calcium: 8.8 mg/dL — ABNORMAL LOW (ref 8.9–10.3)
Chloride: 103 mmol/L (ref 98–111)
Creatinine, Ser: 0.87 mg/dL (ref 0.44–1.00)
GFR, Estimated: >60 mL/min (ref >60)
Glucose, Bld: 192 mg/dL — ABNORMAL HIGH (ref 70–99)
Potassium: 4.1 mmol/L (ref 3.5–5.1)
Sodium: 138 mmol/L (ref 135–145)
Total Bilirubin: 0.5 mg/dL (ref 0.0–1.2)
Total Protein: 6.8 g/dL (ref 6.5–8.1)

## 2023-10-17 LAB — CBC WITH DIFFERENTIAL/PLATELET
Abs Immature Granulocytes: 0.13 K/uL — ABNORMAL HIGH (ref 0.00–0.07)
Basophils Absolute: 0 K/uL (ref 0.0–0.1)
Basophils Relative: 0 %
Eosinophils Absolute: 0 K/uL (ref 0.0–0.5)
Eosinophils Relative: 0 %
HCT: 40.4 % (ref 36.0–46.0)
Hemoglobin: 13 g/dL (ref 12.0–15.0)
Immature Granulocytes: 1 %
Lymphocytes Relative: 6 %
Lymphs Abs: 0.9 K/uL (ref 0.7–4.0)
MCH: 29.1 pg (ref 26.0–34.0)
MCHC: 32.2 g/dL (ref 30.0–36.0)
MCV: 90.6 fL (ref 80.0–100.0)
Monocytes Absolute: 1.5 K/uL — ABNORMAL HIGH (ref 0.1–1.0)
Monocytes Relative: 10 %
Neutro Abs: 13.1 K/uL — ABNORMAL HIGH (ref 1.7–7.7)
Neutrophils Relative %: 83 %
Platelets: 242 K/uL (ref 150–400)
RBC: 4.46 MIL/uL (ref 3.87–5.11)
RDW: 15.8 % — ABNORMAL HIGH (ref 11.5–15.5)
WBC: 15.7 K/uL — ABNORMAL HIGH (ref 4.0–10.5)
nRBC: 0 % (ref 0.0–0.2)

## 2023-10-17 LAB — LIPASE, BLOOD: Lipase: 30 U/L (ref 11–51)

## 2023-10-17 MED ORDER — IOHEXOL 300 MG/ML  SOLN
100.0000 mL | Freq: Once | INTRAMUSCULAR | Status: AC | PRN
  Administered 2023-10-17: 100 mL via INTRAVENOUS

## 2023-10-17 MED ORDER — HYDROMORPHONE HCL 1 MG/ML IJ SOLN
1.0000 mg | Freq: Once | INTRAMUSCULAR | Status: AC
  Administered 2023-10-17: 1 mg via INTRAVENOUS
  Filled 2023-10-17: qty 1

## 2023-10-17 MED ORDER — FENTANYL CITRATE (PF) 100 MCG/2ML IJ SOLN
50.0000 ug | Freq: Once | INTRAMUSCULAR | Status: AC
  Administered 2023-10-17: 50 ug via INTRAVENOUS
  Filled 2023-10-17: qty 2

## 2023-10-17 MED ORDER — ONDANSETRON HCL 4 MG/2ML IJ SOLN
4.0000 mg | Freq: Once | INTRAMUSCULAR | Status: AC
  Administered 2023-10-17: 4 mg via INTRAVENOUS
  Filled 2023-10-17: qty 2

## 2023-10-17 NOTE — H&P (Addendum)
 [facility] Medicine  History and Physical    Assessment and Plan <redacted file path>   Nicole [name] is a 73 y.o. female who is presenting to [facility] with severe abdominal pain after ERCP with , in the setting of the following pertinent/contributing co-morbidities: Lynch syndrome with history of colon cancer s/p resection, renal cancer, non-Hodgkin's lymphoma, B12 deficiency, and recent diagnosis of ampullary adenocarcinoma  Illness that poses a threat to life or bodily function without appropriate treatment  Abdominal pain s/p EUS with hepaticogastrostomy  Recent diagnosis of ampullary adenocarcinoma She is s/p hepaticogastrostomy with metal and plastic stent placed on day before admission (7/31) with Dr. Minnie. She woke up early on day of admission with severe L>R abdominal pain. CT at OSH ER showed small amount of pneumoperitoneum, which may be an expected post-procedural finding, but she was transferred to [facility] for monitoring and GI evaluation. Her vitals are stable and pain relatively well-controlled after prn opiates at OSH at time of arrival to [facility], without peritonitic abdomen.   - Discussed with GI fellow, and will obtain CT A/P to evaluate for progression of pneumoperitoneum - CBC, INR, CMP, lipase ordered - GI aware of patient, should see her in am - NPO for now, pending CT - 1L LR given minimal PO intake  - Pain management with prn Tylenol , oxy 5 mg, and IV Dilaudid  for breakthrough pain - Prn ODT/IV Zofran  - She has recent prescription for 5 days of Levaquin  but she is unclear whether she is supposed to be taking this. Can clarify with GI in am whether she should be on post-procedure ppx. In the meantime, covering broadly with IV Zosyn   (8/1 - ) in case she does have procedural complication like perforation   Secondary/Additional Active Problems <redacted file path>:  History of nephrolithiasis - continue home potassium citrate   B12 deficiency - on home B12  injections, held   Prophylaxis - Holding DVT ppx in case of need for surgical intervention  Diet -NPO Sips with meds; Medically necessary  Code Status / HCDM <redacted file path>  -Full Code, Discussed with patient at the time of admission  -  Anticipated Medically Ready for Discharge: <redacted file path> Anticipated Tomorrow  Significant Comorbid Conditions <redacted file path>:  -Dehydration POA requiring further investigation, treatment, or monitoring  Issues Impacting Complexity of Management: <redacted file path> -Parenteral controlled medications: IV Dilaudid   Medical Decision Making: Reviewed records from the following unique sources  - recent [facility] records and discharge summary.  I personally spent greater than 55 minutes face-to-face and non-face-to-face in the care of this patient, which includes all pre, intra, and post visit time on the date of service.  All documented time was specific to the E/M visit and does not include any procedures that may have been performed.  HPI    Nicole [name] is a 72 y.o. female who is presenting to [facility] with Pain of upper abdomen.  She had EUS yesterday with hepaticogastrostomy. She was feeling well afterwards and was discharged home. This morning, she woke up at about 4 am with severe abdominal pain, prompting her to present to OSH ER ([facility]). CT A/P with small amount of free air, and she was transferred to [facility] for further eval. She received Dilaudid  and fentanyl  and Zofran  before transfer.   At time of arrival, she states that she feels better after pain meds. Denies any vomiting, but she has bouts of severe nausea. No recent fevers or chills. Mouth feels dry, ad  she would like to drink something. No diarrhea. No leg swelling or rashes. Has some lightheadedness, not too severe.    Med Rec Confidence <redacted file path>  I reviewed the Medication List. The current list is Accurate  Physical Exam  Temp:  [36.8 C (98.2 F)]  36.8 C (98.2 F) Pulse:  [79] 79 SpO2 Pulse:  [70] 70 Resp:  [17] 17 BP: (148)/(60) 148/60 SpO2:  [96 %] 96 % Body mass index is 37.72 kg/m.  General:  Well appearing, well nourished, and in no acute distress HEENT: Atraumatic, dry mucous membranes Heart:  Regular rate and rhythm, no murmurs or gallops. Lungs:  Clear to auscultation bilaterally, good air movement throughout lung fields. Abdomen:  Non-distended, no guarding, +epigastric and LUQ tenderness Genitourinary:  Deferred. Extremities:  Warm, no peripheral edema. Psychiatric:  Normal mood and affect, intact judgment and insight.

## 2023-10-17 NOTE — ED Notes (Signed)
 [facility] for transport to [facility]- Spoke with infinity

## 2023-10-17 NOTE — ED Notes (Signed)
 [facility] has arrived and at bedside preparing the patient for transport to [facility].

## 2023-10-17 NOTE — ED Provider Notes (Signed)
  Physical Exam  BP (!) 179/73   Pulse 89   Temp 98.6 F (37 C)   Resp 20   Ht 5' 0.5 (1.537 m)   Wt 86 kg   SpO2 100%   BMI 36.42 kg/m   Physical Exam  Procedures  Procedures  ED Course / MDM   Clinical Course as of 10/17/23 0826  Fri Oct 17, 2023  0631 Pigtail cath are noted on chest x-ray.  Unable to visualize op notes from yesterday.  Patient does endorse she had a stent placed. [CH]    Clinical Course User Index [CH] Horton, Charmaine FALCON, MD   Medical Decision Making Amount and/or Complexity of Data Reviewed Labs: ordered. Radiology: ordered.  Risk Prescription drug management.   Patient with abdominal pain.  Stent placed yesterday at [facility].  Had ERCP according to documentation.  However appears on the CT scan to be a biliary drain to the stomach.  Does go transhepatic.  Small amount of free air.  No fluid collection.  Discussed with radiologist.  Have [facility] to [facility].  Will discuss with GI at [facility].  It is now been an hour.  Still have not heard back from [facility].   Did hear back from [facility].  Dr. Unice from hospitals.  Reviewed lab work and imaging with them.  Excepted in transfer.      Nicole Lot, MD 10/17/23 1026

## 2023-10-17 NOTE — ED Triage Notes (Signed)
 Pt stated pain started at 0400 this morning. Pt said she had a procedure done in [place] yesterday and upper EUS. Pain rated 10/10 stabbing non stop. No meds taken for pain.

## 2023-10-17 NOTE — Telephone Encounter (Signed)
 Called the patient. No answer. Left this information on her voicemail with my name and contact information if she has questions.

## 2023-10-17 NOTE — ED Provider Notes (Signed)
 [place] EMERGENCY DEPARTMENT AT [facility] Provider Note   CSN: [id number] Arrival date & time: 10/17/23  0451     Patient presents with: Abdominal Pain   Nicole [name] is a 73 y.o. female.   HPI     This is a 73 year old female with recent history of choledocholithiasis and ampullary carcinoma who underwent ERCP with biopsy yesterday at [facility] who presents with abdominal pain.  Patient reports that postoperatively she had a fair bit of back pain and was observed for an extensive period of time.  She is unsure whether there was any complication but I heard something about maybe a bile leak.  Patient was discharged home.  She was doing well.  She ate a small amount of dinner.  She went to bed.  She woke up approximately 2 hours ago with intense upper abdominal pain.  Describes it as stabbing and 10 out of 10.  Has not taken anything for the pain.  Denies nausea or vomiting.  Prior to Admission medications   Medication Sig Start Date End Date Taking? Authorizing Provider  acetaminophen  (TYLENOL ) 500 MG tablet Take 1,000 mg by mouth every 8 (eight) hours as needed for fever.    [provider]  BIOTIN  PO Take 1 tablet by mouth daily.    [provider]  Cholecalciferol  (VITAMIN D3) 125 MCG (5000 UT) CAPS Take 5,000 Units by mouth daily.    [provider]  cyanocobalamin  (VITAMIN B12) 1000 MCG/ML injection ADMINISTER 1 ML(1000 MCG) IN THE MUSCLE EVERY 21 DAYS 10/31/22   Rogers Hai, MD  diphenoxylate -atropine  (LOMOTIL ) 2.5-0.025 MG tablet Take 1 tablet by mouth 4 (four) times daily as needed for diarrhea or loose stools. 11/05/21   Rogers Hai, MD  furosemide  (LASIX ) 40 MG tablet Take 40 mg by mouth daily as needed for fluid.     [provider]  magnesium  oxide (MAG-OX) 400 (240 Mg) MG tablet Take 400 mg by mouth daily.    [provider]  Potassium Citrate  15 MEQ (1620 MG) TBCR Take 1 tablet by mouth 3  (three) times daily. 08/01/23   Matilda Senior, MD    Allergies: Latex, Morphine and codeine, and Tape    Review of Systems  Constitutional:  Negative for fever.  Respiratory:  Negative for shortness of breath.   Cardiovascular:  Negative for chest pain.  Gastrointestinal:  Positive for abdominal pain. Negative for nausea and vomiting.  All other systems reviewed and are negative.   Updated Vital Signs BP (!) 185/80   Pulse 99   Temp 98.6 F (37 C)   Resp 16   Ht 1.537 m (5' 0.5)   Wt 86 kg   SpO2 (!) 89%   BMI 36.42 kg/m   Physical Exam Vitals and nursing note reviewed.  Constitutional:      Appearance: She is well-developed. She is obese.  HENT:     Head: Normocephalic and atraumatic.  Eyes:     Pupils: Pupils are equal, round, and reactive to light.  Cardiovascular:     Rate and Rhythm: Normal rate and regular rhythm.     Heart sounds: Normal heart sounds.  Pulmonary:     Effort: Pulmonary effort is normal. No respiratory distress.     Breath sounds: No wheezing.  Abdominal:     General: Bowel sounds are normal.     Palpations: Abdomen is soft.     Tenderness: There is generalized abdominal tenderness and tenderness in the epigastric  area. There is guarding. There is no rebound.  Musculoskeletal:     Cervical back: Neck supple.  Skin:    General: Skin is warm and dry.  Neurological:     Mental Status: She is alert and oriented to person, place, and time.  Psychiatric:        Mood and Affect: Mood normal.     (all labs ordered are listed, but only abnormal results are displayed) Labs Reviewed  CBC WITH DIFFERENTIAL/PLATELET - Abnormal; Notable for the following components:      Result Value   WBC 15.7 (*)    RDW 15.8 (*)    Neutro Abs 13.1 (*)    Monocytes Absolute 1.5 (*)    Abs Immature Granulocytes 0.13 (*)    All other components within normal limits  COMPREHENSIVE METABOLIC PANEL WITH GFR - Abnormal; Notable for the following components:    Glucose, Bld 192 (*)    Calcium 8.8 (*)    Albumin 3.4 (*)    AST 71 (*)    ALT 141 (*)    Alkaline Phosphatase 338 (*)    All other components within normal limits  LIPASE, BLOOD    EKG: None  Radiology: DG Chest Portable 1 View Result Date: 10/17/2023 CLINICAL DATA:  73 year old female with pain status post GI endoscopy yesterday. EXAM: PORTABLE CHEST 1 VIEW COMPARISON:  Recent chest CT 10/07/2023 and earlier. FINDINGS: Portable AP semi upright view at 0541 hours. Lower lung volumes, exaggerated cardiac and mediastinal contours. Visualized tracheal air column is within normal limits. No pneumothorax, pulmonary edema, pleural effusion or consolidation. No confluent lung opacity. Pigtail catheter in the upper abdomen terminates in the left upper quadrant. No pneumoperitoneum is evident. Nonobstructed visible bowel gas pattern. No acute osseous abnormality identified. IMPRESSION: 1. Low lung volumes. No acute cardiopulmonary abnormality identified. 2. Pigtail catheter in the left upper quadrant. No pneumoperitoneum identified. Electronically Signed   By: VEAR Hurst M.D.   On: 10/17/2023 06:00     Procedures   Medications Ordered in the ED  iohexol  (OMNIPAQUE ) 300 MG/ML solution 100 mL (has no administration in time range)  fentaNYL  (SUBLIMAZE ) injection 50 mcg (50 mcg Intravenous Given 10/17/23 0551)  ondansetron  (ZOFRAN ) injection 4 mg (4 mg Intravenous Given 10/17/23 0548)  HYDROmorphone  (DILAUDID ) injection 1 mg (1 mg Intravenous Given 10/17/23 0633)    Clinical Course as of 10/17/23 0702  Fri Oct 17, 2023  0631 Pigtail cath are noted on chest x-ray.  Unable to visualize op notes from yesterday.  Patient does endorse she had a stent placed. [CH]    Clinical Course User Index [CH] Enis Riecke, Charmaine FALCON, MD                                 Medical Decision Making Amount and/or Complexity of Data Reviewed Labs: ordered. Radiology: ordered.  Risk Prescription drug management.   This  patient presents to the ED for concern of abdominal pain, this involves an extensive number of treatment options, and is a complaint that carries with it a high risk of complications and morbidity.  I considered the following differential and admission for this acute, potentially life threatening condition.  The differential diagnosis includes pancreatitis, cholecystitis, choledocholithiasis, post procedural complication including perforation  MDM:    This is a 73 year old female who presents with abdominal pain.  Had an ERCP yesterday with stent placement for her pancreatic adenocarcinoma and choledocholithiasis.  Reports  acute onset of abdominal pain.  Did have some significant back pain postoperatively.  She is uncomfortable appearing but nontoxic.  Blood pressure 185/80.  Patient given pain and nausea medication.  Labs obtained.  LFTs slightly more elevated than baseline.  Lipase normal.  Patient has a leukocytosis to 15.7.  Chest x-ray does not show any evidence of free air or obvious perforation.  She does have a pigtail catheter which I presume is the pancreatic stent.  Will obtain advanced imaging.  (Labs, imaging, consults)  Labs: I Ordered, and personally interpreted labs.  The pertinent results include: CBC, CMP, lipase  Imaging Studies ordered: I ordered imaging studies including chest x-ray, CT I independently visualized and interpreted imaging. I agree with the radiologist interpretation  Additional history obtained from chart review.  External records from outside source obtained and reviewed including [facility] records  Cardiac Monitoring: The patient was maintained on a cardiac monitor.  If on the cardiac monitor, I personally viewed and interpreted the cardiac monitored which showed an underlying rhythm of: Sinus  Reevaluation: After the interventions noted above, I reevaluated the patient and found that they have :stayed the same  Social Determinants of Health:  lives  independently  Disposition: Pending  Co morbidities that complicate the patient evaluation  Past Medical History:  Diagnosis Date   Allergy ?   Anemia    Arthritis    shorulder   B12 deficiency    Chronic diarrhea SECONDARY HX COLON CANCER   History of colon cancer [facility] SYNDROME ---  COLORECTAL CANCER S/P COLECTOMY X3  LAST ONE 2007   NO RECURRENCE   History of colon cancer 05/04/2013   History of kidney infection 08/2013   History of kidney stones    History of MRSA infection 08/2013   left leg   Hx of osteopenia    Hyperlipidemia ?   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   NHL (non-Hodgkin's lymphoma) (HCC) 10/17/2010   MONITORED BY DR PERNELL   Personal history of renal cancer S/P LEFT PARTIAL NEPHRECTOMY 2007--  NO RECURRENCE   DUE TO KIDNEY CANCER   Right shoulder pain S/P ROTATOR CUFF REPAIR   Skin cancer      Medicines Meds ordered this encounter  Medications   fentaNYL  (SUBLIMAZE ) injection 50 mcg   ondansetron  (ZOFRAN ) injection 4 mg   HYDROmorphone  (DILAUDID ) injection 1 mg   iohexol  (OMNIPAQUE ) 300 MG/ML solution 100 mL    I have reviewed the patients home medicines and have made adjustments as needed  Problem List / ED Course: Problem List Items Addressed This Visit   None            Final diagnoses:  None    ED Discharge Orders     None          Bari Charmaine FALCON, MD 10/17/23 [phone]

## 2023-10-17 NOTE — Telephone Encounter (Signed)
 Its the [facility] appt, she doesn't need f/u with us  now

## 2023-10-18 ENCOUNTER — Encounter (HOSPITAL_COMMUNITY): Payer: Self-pay | Admitting: Hematology

## 2023-10-18 DIAGNOSIS — T85528A Displacement of other gastrointestinal prosthetic devices, implants and grafts, initial encounter: Secondary | ICD-10-CM | POA: Diagnosis not present

## 2023-10-18 DIAGNOSIS — Z8572 Personal history of non-Hodgkin lymphomas: Secondary | ICD-10-CM | POA: Diagnosis not present

## 2023-10-18 DIAGNOSIS — E538 Deficiency of other specified B group vitamins: Secondary | ICD-10-CM | POA: Diagnosis not present

## 2023-10-18 DIAGNOSIS — K8051 Calculus of bile duct without cholangitis or cholecystitis with obstruction: Secondary | ICD-10-CM | POA: Diagnosis not present

## 2023-10-18 DIAGNOSIS — R509 Fever, unspecified: Secondary | ICD-10-CM | POA: Diagnosis not present

## 2023-10-18 DIAGNOSIS — C241 Malignant neoplasm of ampulla of Vater: Secondary | ICD-10-CM | POA: Diagnosis not present

## 2023-10-18 DIAGNOSIS — Z1509 Genetic susceptibility to other malignant neoplasm: Secondary | ICD-10-CM | POA: Diagnosis not present

## 2023-10-18 DIAGNOSIS — Z85528 Personal history of other malignant neoplasm of kidney: Secondary | ICD-10-CM | POA: Diagnosis not present

## 2023-10-18 DIAGNOSIS — Z85038 Personal history of other malignant neoplasm of large intestine: Secondary | ICD-10-CM | POA: Diagnosis not present

## 2023-10-18 DIAGNOSIS — R7989 Other specified abnormal findings of blood chemistry: Secondary | ICD-10-CM | POA: Diagnosis not present

## 2023-10-18 DIAGNOSIS — K838 Other specified diseases of biliary tract: Secondary | ICD-10-CM | POA: Diagnosis not present

## 2023-10-18 DIAGNOSIS — T85520A Displacement of bile duct prosthesis, initial encounter: Secondary | ICD-10-CM | POA: Diagnosis not present

## 2023-10-18 DIAGNOSIS — D72829 Elevated white blood cell count, unspecified: Secondary | ICD-10-CM | POA: Diagnosis not present

## 2023-10-18 DIAGNOSIS — Z87442 Personal history of urinary calculi: Secondary | ICD-10-CM | POA: Diagnosis not present

## 2023-10-18 DIAGNOSIS — K862 Cyst of pancreas: Secondary | ICD-10-CM | POA: Diagnosis not present

## 2023-10-18 NOTE — Progress Notes (Signed)
 [facility]   [phone] Fax:([phone]) [phone]   Clinic Follow up Note   Patient Care Team: [name] [name], FNP as PCP - General (Family Medicine) Matilda Senior, MD as Consulting Physician (Urology) [facility], [facility]  Lloyd Savannah, MD as Referring Physician (Dermatology) Rogers Hai, MD as Consulting Physician (Hematology) 10/18/2023  I connected with Dickey KATHEE Fickle on 10/15/2023 at  4:00 PM EDT by telephone and verified that I am speaking with the correct person using two identifiers.   I discussed the limitations, risks, security and privacy concerns of performing an evaluation and management service by telephone and the availability of in person appointments. I also discussed with the patient that there may be a patient responsible charge related to this service. The patient expressed understanding and agreed to proceed.   Patient's location:  Home  Provider's location:  Office    CHIEF COMPLAINT: f/u ampullary carcinoma   CURRENT THERAPY: Pending  Oncology history Ampullary carcinoma (HCC) cTxN0M0, MMR pending - Patient presented with abdominal pain.  EGD showed an ulcerated mass at his ampulla, biopsy showed atypical cells suspicious for adenocarcinoma.  - Staging CT scan was negative for definitive metastasis, except multiple small lung nodules up to 6 mm, slightly enlarged hilar nodes likely reactive, and stable abdominal pelvis adenopathy likely related to her follicular lymphoma. -I recommend PET scan for staging. -Patient has Lynch syndrome, if her tumor has MMR deficiency, I recommend immunotherapy with Keytruda and we will hold surgery for now.    Assessment & Plan Ampullary carcinoma Ampullary carcinoma with potential lymph node involvement. PET scan is recommended to assess disease extent and differentiate between lymphoma and ampullary carcinoma involvement. Surgery is standard if PET scan is negative  for metastasis. Immunotherapy may be effective if related to Lynch syndrome. Radiation is an option, but surgery has a higher cure rate. She expressed concern about the Whipple procedure due to its extensive nature and her son's previous experience. - Order PET scan to assess lymph node involvement and differentiate between lymphoma and ampullary carcinoma. - Refer to Dr. Aron for surgical consultation if PET scan is negative. - Discuss potential for immunotherapy if Lynch syndrome is confirmed. - Consider radiation therapy if surgery is not an option.  Follicular lymphoma Follicular lymphoma with enlarged lymph nodes. It is slow-growing, and lymph nodes are not expected to light up on PET scan unless involved with ampullary carcinoma.   Lynch syndrome -She had a multiple early stage colon cancer, status post subtotal colectomy.   Plan -MMR on her biopsy is still pending, if confirmed, immunotherapy could be offered  - She is scheduled to see surgeon Dr. Aron next Monday - Follow-up with Dr. Davonna next Monday at [facility] to establish care -I ordered PET scan and will cancel her CT next week      SUMMARY OF ONCOLOGIC HISTORY: Oncology History  Ampullary carcinoma (HCC)  10/03/2023 Cancer Staging   Staging form: Ampulla of Vater, AJCC 8th Edition - Clinical stage from 10/03/2023: Stage Unknown (cTX, cN0, cM0) - Signed by Lanny Callander, MD on 10/14/2023 Total positive nodes: 0   10/14/2023 Initial Diagnosis   Ampullary carcinoma (HCC)     Discussed the use of AI scribe software for clinical note transcription with the patient, who gave verbal consent to proceed.  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  She experienced a fever on Saturday,  which resolved by Sunday morning. Occasional abdominal pain is present. Eating and bowel movements are normal, and her appetite is adequate. She has a history  of slow-growing follicular lymphoma. A test is pending to determine if her condition is related to Lynch syndrome.     REVIEW OF SYSTEMS:   Constitutional: Denies fevers, chills or abnormal weight loss Eyes: Denies blurriness of vision Ears, nose, mouth, throat, and face: Denies mucositis or sore throat Respiratory: Denies cough, dyspnea or wheezes Cardiovascular: Denies palpitation, chest discomfort or lower extremity swelling Gastrointestinal:  Denies nausea, heartburn or change in bowel habits Skin: Denies abnormal skin rashes Lymphatics: Denies new lymphadenopathy or easy bruising Neurological:Denies numbness, tingling or new weaknesses Behavioral/Psych: Mood is stable, no new changes  All other systems were reviewed with the patient and are negative.  MEDICAL HISTORY:  Past Medical History:  Diagnosis Date   Allergy ?   Anemia    Arthritis    shorulder   B12 deficiency    Chronic diarrhea SECONDARY HX COLON CANCER   History of colon cancer [facility] SYNDROME ---  COLORECTAL CANCER S/P COLECTOMY X3  LAST ONE 2007   NO RECURRENCE   History of colon cancer 05/04/2013   History of kidney infection 08/2013   History of kidney stones    History of MRSA infection 08/2013   left leg   Hx of osteopenia    Hyperlipidemia ?   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   NHL (non-Hodgkin's lymphoma) (HCC) 10/17/2010   MONITORED BY DR PERNELL   Personal history of renal cancer S/P LEFT PARTIAL NEPHRECTOMY 2007--  NO RECURRENCE   DUE TO KIDNEY CANCER   Right shoulder pain S/P ROTATOR CUFF REPAIR   Skin cancer     SURGICAL HISTORY: Past Surgical History:  Procedure Laterality Date   ABDOMINAL HYSTERECTOMY     APPENDECTOMY  1993   W/ LAVH   COLON SURGERY  2001,2002,2007   COLONOSCOPY W/ POLYPECTOMY     CYSTOSCOPY W/ RETROGRADES  07/14/2014   Procedure: CYSTOSCOPY WITH RETROGRADE PYELOGRAM;  Surgeon: Garnette Shack, MD;  Location:  [facility];  Service: Urology;;   CYSTOSCOPY W/ URETERAL STENT REMOVAL Left 07/14/2014   Procedure: CYSTOSCOPY WITH STENT REMOVAL;  Surgeon: Garnette Shack, MD;  Location: [facility];  Service: Urology;  Laterality: Left;   CYSTOSCOPY WITH RETROGRADE PYELOGRAM, URETEROSCOPY AND STENT PLACEMENT Left 11/30/2020   Procedure: CYSTOSCOPY WITH RETROGRADE PYELOGRAM, URETEROSCOPY/STONE EXTRACTION AND STENT PLACEMENT;  Surgeon: Shack Garnette, MD;  Location: [facility];  Service: Urology;  Laterality: Left;   CYSTOSCOPY WITH RETROGRADE PYELOGRAM, URETEROSCOPY AND STENT PLACEMENT Left 06/24/2022   Procedure: CYSTOSCOPY WITH RETROGRADE PYELOGRAM, URETEROSCOPY AND STENT PLACEMENT;  Surgeon: Shack Garnette, MD;  Location: [facility];  Service: Urology;  Laterality: Left;   CYSTOSCOPY WITH STENT PLACEMENT  02/07/2012   Procedure: CYSTOSCOPY WITH STENT PLACEMENT;  Surgeon: Garnette Shack, MD;  Location: [facility];  Service: Urology;  Laterality: Left;   CYSTOSCOPY WITH STENT PLACEMENT Left 07/14/2014   Procedure: CYSTOSCOPY WITH STENT PLACEMENT;  Surgeon: Garnette Shack, MD;  Location: [facility];  Service: Urology;  Laterality: Left;   ESOPHAGOGASTRODUODENOSCOPY N/A 10/03/2023   Procedure: EGD (ESOPHAGOGASTRODUODENOSCOPY);  Surgeon: Avram Lupita BRAVO, MD;  Location: [facility];  Service: Gastroenterology;  Laterality: N/A;  ercp scheduled, but unable to be done due to large ulcer   EYE SURGERY  ?   Removal  of styes   FLEXIBLE SIGMOIDOSCOPY  08/29/2011   Procedure: FLEXIBLE SIGMOIDOSCOPY;  Surgeon: Claudis RAYMOND Rivet, MD;  Location: [facility];  Service: Endoscopy;  Laterality: N/A;  930   FLEXIBLE SIGMOIDOSCOPY N/A 09/28/2012   Procedure: FLEXIBLE SIGMOIDOSCOPY;  Surgeon: Claudis RAYMOND Rivet, MD;  Location: [facility];  Service: Endoscopy;  Laterality: N/A;  730   FLEXIBLE SIGMOIDOSCOPY N/A 07/08/2014    Procedure: FLEXIBLE SIGMOIDOSCOPY;  Surgeon: Claudis RAYMOND Rivet, MD;  Location: [facility];  Service: Endoscopy;  Laterality: N/A;  830 - moved to 10:20 - Ann to notify pt   FLEXIBLE SIGMOIDOSCOPY N/A 08/11/2015   Procedure: FLEXIBLE SIGMOIDOSCOPY;  Surgeon: Claudis RAYMOND Rivet, MD;  Location: [facility];  Service: Endoscopy;  Laterality: N/A;  730   FLEXIBLE SIGMOIDOSCOPY N/A 08/13/2016   Procedure: FLEXIBLE SIGMOIDOSCOPY;  Surgeon: Rivet Claudis RAYMOND, MD;  Location: [facility];  Service: Endoscopy;  Laterality: N/A;  730   FLEXIBLE SIGMOIDOSCOPY N/A 10/06/2019   Procedure: FLEXIBLE SIGMOIDOSCOPY;  Surgeon: Rivet Claudis RAYMOND, MD;  Location: [facility];  Service: Endoscopy;  Laterality: N/A;  1245   HEMICOLECTOMY  2001   right   HEMICOLECTOMY  2002   left   HOLMIUM LASER APPLICATION Left 07/14/2014   Procedure: HOLMIUM LASER LITHOTRIPSY,;  Surgeon: Garnette Shack, MD;  Location: [facility];  Service: Urology;  Laterality: Left;   HOLMIUM LASER APPLICATION Left 11/30/2020   Procedure: HOLMIUM LASER APPLICATION;  Surgeon: Shack Garnette, MD;  Location: [facility];  Service: Urology;  Laterality: Left;   HOLMIUM LASER APPLICATION Left 06/24/2022   Procedure: HOLMIUM LASER APPLICATION;  Surgeon: Shack Garnette, MD;  Location: [facility];  Service: Urology;  Laterality: Left;   IR RADIOLOGIST EVAL & MGMT  04/29/2018   IR RADIOLOGIST EVAL & MGMT  10/07/2019   IR RADIOLOGIST EVAL & MGMT  10/11/2020   IR RADIOLOGIST EVAL & MGMT  11/08/2021   kidney resection  2007   left partial   LAPAROSCOPIC ASSISTED VAGINAL HYSTERECTOMY  1993   W/ BILATERAL SALPINGO-OOPHORECTOMY   LEFT FLANK EXPLORATION W/  PARTIAL LEFT NEPHRECTOMY/ LEFT HILAR LYMPH NODE BX  04-17-2005  DR DAHLSTEDT   RENAL CELL CARCINOMA   LITHOTRIPSY  11-22013   x2   NEPHROLITHOTOMY Left 06/09/2014   Procedure: NEPHROLITHOTOMY PERCUTANEOUS;  Surgeon: Garnette Shack, MD;   Location: [facility];  Service: Urology;  Laterality: Left;  with STENT   RADIOLOGY WITH ANESTHESIA Left 05/20/2018   Procedure: CT WITH ANESTHESIA LEFT RENAL CRYOABLATION AND BIOPSY;  Surgeon: Vanice Sharper, MD;  Location: [facility];  Service: Anesthesiology;  Laterality: Left;   RIGHT ROTATOR  CUFF REPAIR/   BICEP REPAIR  10-17-2011  DR SUPPLE   SUBTOTAL COLECTOMY  2007   URETEROSCOPY WITH HOLMIUM LASER LITHOTRIPSY Left 07/14/2014   Procedure: URETEROSCOPY  EXTRACTION OF STONES;  Surgeon: Garnette Shack, MD;  Location: [facility];  Service: Urology;  Laterality: Left;    I have reviewed the social history and family history with the patient and they are unchanged from previous note.  ALLERGIES:  is allergic to latex, morphine and codeine, and tape.  MEDICATIONS:  Current Outpatient Medications  Medication Sig Dispense Refill   acetaminophen  (TYLENOL ) 500 MG tablet Take 1,000 mg by mouth every 8 (eight) hours as needed for fever.     BIOTIN  PO Take 1 tablet by mouth daily.     cephALEXin  (KEFLEX ) 500 MG capsule Take 500 mg by mouth 2 (  two) times daily. (Patient not taking: Reported on 10/17/2023)     Cholecalciferol  (VITAMIN D3) 125 MCG (5000 UT) CAPS Take 5,000 Units by mouth daily.     cyanocobalamin  (VITAMIN B12) 1000 MCG/ML injection ADMINISTER 1 ML(1000 MCG) IN THE MUSCLE EVERY 21 DAYS 1 mL 17   diphenoxylate -atropine  (LOMOTIL ) 2.5-0.025 MG tablet Take 1 tablet by mouth 4 (four) times daily as needed for diarrhea or loose stools. 60 tablet 6   furosemide  (LASIX ) 40 MG tablet Take 40 mg by mouth daily as needed for fluid.      magnesium  oxide (MAG-OX) 400 (240 Mg) MG tablet Take 400 mg by mouth daily.     Potassium Citrate  15 MEQ (1620 MG) TBCR Take 1 tablet by mouth 3 (three) times daily. 255 tablet 1   No current facility-administered medications for this visit.    PHYSICAL EXAMINATION: Not performed   LABORATORY DATA:  I have reviewed the data as listed    Latest  Ref Rng & Units 10/17/2023    5:34 AM 10/08/2023    2:24 AM 10/07/2023    4:03 AM  CBC  WBC 4.0 - 10.5 K/uL 15.7  7.4  6.1   Hemoglobin 12.0 - 15.0 g/dL 86.9  88.3  88.0   Hematocrit 36.0 - 46.0 % 40.4  36.2  36.9   Platelets 150 - 400 K/uL 242  181  180         Latest Ref Rng & Units 10/17/2023    5:34 AM 10/08/2023    2:24 AM 10/07/2023    4:03 AM  CMP  Glucose 70 - 99 mg/dL 807  888  888   BUN 8 - 23 mg/dL 16  18  20    Creatinine 0.44 - 1.00 mg/dL 9.12  9.04  9.06   Sodium 135 - 145 mmol/L 138  137  139   Potassium 3.5 - 5.1 mmol/L 4.1  4.4  4.5   Chloride 98 - 111 mmol/L 103  104  105   CO2 22 - 32 mmol/L 22  25  25    Calcium 8.9 - 10.3 mg/dL 8.8  8.8  9.1   Total Protein 6.5 - 8.1 g/dL 6.8  5.6  5.7   Total Bilirubin 0.0 - 1.2 mg/dL 0.5  0.7  0.5   Alkaline Phos 38 - 126 U/L 338  196  187   AST 15 - 41 U/L 71  49  39   ALT 0 - 44 U/L 141  66  67       RADIOGRAPHIC STUDIES: I have personally reviewed the radiological images as listed and agreed with the findings in the report. CT ABDOMEN PELVIS W CONTRAST Result Date: 10/17/2023 EXAM: CT ABDOMEN AND PELVIS WITH CONTRAST 10/17/2023 07:25:45 AM TECHNIQUE: CT of the abdomen and pelvis was performed with the administration of intravenous contrast. Multiplanar reformatted images are provided for review. Automated exposure control, iterative reconstruction, and/or weight based adjustment of the mA/kV was utilized to reduce the radiation dose to as low as reasonably achievable. COMPARISON: 10/04/2023 CLINICAL HISTORY: Abdominal pain, post-op; ERCP with pancreatic stent yesterday. Pt stated pain started at 0400 this morning. Pt said she had a procedure done in [place] yesterday and upper EUS. Pain rated 10/10 stabbing non stop. FINDINGS: LOWER CHEST: Increased subsegmental atelectasis within the lung bases with new asymmetric elevation of the left hemidiaphragm. LIVER: Pneumobilia and mild intrahepatic bile duct dilatation within the  left lobe. There has been decompression of the dilated ducts to  the right lobe. GALLBLADDER AND BILE DUCTS: Since the previous exam there has been interval placement of a transbiliary biliary-to-gastric drainage catheter. The proximal portion of the stent is in the proximal common bile duct. The stent then traverses the left hepatic lobe and into the stomach with the distal pigtail in the gastric fundus. Multiple gallstones are identified, which measure up to 9 mm. There is mild gallbladder wall thickening measuring 3 mm. No significant pericholecystic fluid. Interval improvement in fusiform dilatation of the common bile duct, which measures up to versus 1.8 cm previously. Multiple common bile duct stones are again seen, which measure up to 4 mm. SPLEEN: No acute abnormality. PANCREAS: Small hypodense lesion within the head of the pancreas is unchanged measuring 9 mm, image 35/2. No pancreatic main duct dilatation. ADRENAL GLANDS: No acute abnormality. KIDNEYS, URETERS AND BLADDER: Multiple areas of cortical scarring and volume loss involving the left kidney with unchanged elvocaliectasis. Multiple calcifications within the left renal collecting system measure up to 9 mm, image 34/2. Several subcentimeter cysts are noted bilaterally, compatible with benign Bosniak class 2 cysts. No follow-up imaging is recommended. The bladder appears normal. GI AND BOWEL: There is wall thickening identified along the anterior all of the stomach abutting the left hepatic lobe. There is soft tissue stranding and free air identified adjacent to the gastric fundus as within the wall of the gastric fundus is noted anteriorly image 15/2. Suspected tumor within the descending duodenum, ampulla, or head of pancreas the lesion is ill-defined, and the margins are difficult to measure. Signs of previous subtotal colectomy with primary anastomosis in the left pelvis. No pathologic dilatation of the large or small bowel loops. PERITONEUM AND  RETROPERITONEUM: Small volume of free fluid noted within the left upper quadrant of the abdomen along the inferior margin of the spleen is new compared with the previous exam. Loculated fluid collections to suggest drainable abscess. VASCULATURE: Aorta is normal in caliber. Aortic atherosclerosis. LYMPH NODES: Abdominal adenopathy is again identified and appears unchanged from the previous exam. Index left periaortic lymph node measures 1.7 cm short axis, image 32/2. Retrocaval lymph node measures 1.4 cm, image 35/2. REPRODUCTIVE ORGANS: Status post hysterectomy. No adnexal mass. BONES AND SOFT TISSUES: No acute osseous abnormality. No focal soft tissue abnormality. IMPRESSION: 1. Interval placement of a transbiliary biliary-to-gastric drainage catheter with multiple gallstones and mild gallbladder wall thickening. Interval improvement in fusiform dilatation of the common bile duct. Multiple common bile duct stones are again seen. 2. Wall thickening along the anterior wall of the stomach abutting the left hepatic lobe with soft tissue stranding and free air adjacent to the gastric fundus.It is not uncommon to see a small amount of free air (pneumoperitoneum) immediately after placement of a transbiliary biliary-to-gastric drainage catheter. If the patient's symptoms persist or progress, consider follow-up imaging to evaluate for increasing amount of pneumoperitoneum or associated fluid collections, fat stranding, or signs of peritonitis. 3. Suspected tumor within the descending duodenum, ampulla, or head of pancreas. The lesion is ill-defined, and the margins are difficult to measure. 4. Abdominal adenopathy, unchanged from the previous exam. Electronically signed by: Waddell Calk MD 10/17/2023 08:02 AM EDT RP Workstation: [id number]   DG Chest Portable 1 View Result Date: 10/17/2023 CLINICAL DATA:  73 year old female with pain status post GI endoscopy yesterday. EXAM: PORTABLE CHEST 1 VIEW COMPARISON:  Recent  chest CT 10/07/2023 and earlier. FINDINGS: Portable AP semi upright view at 0541 hours. Lower lung volumes, exaggerated cardiac and mediastinal contours. Visualized tracheal  air column is within normal limits. No pneumothorax, pulmonary edema, pleural effusion or consolidation. No confluent lung opacity. Pigtail catheter in the upper abdomen terminates in the left upper quadrant. No pneumoperitoneum is evident. Nonobstructed visible bowel gas pattern. No acute osseous abnormality identified. IMPRESSION: 1. Low lung volumes. No acute cardiopulmonary abnormality identified. 2. Pigtail catheter in the left upper quadrant. No pneumoperitoneum identified. Electronically Signed   By: VEAR Hurst M.D.   On: 10/17/2023 06:00       I discussed the assessment and treatment plan with the patient. The patient was provided an opportunity to ask questions and all were answered. The patient agreed with the plan and demonstrated an understanding of the instructions.   The patient was advised to call back or seek an in-person evaluation if the symptoms worsen or if the condition fails to improve as anticipated.  I provided 15 minutes of non face-to-face telephone visit time during this encounter, including review of chart and various tests results, discussions about plan of care and coordination of care plan.    Onita Mattock, MD 10/15/2023

## 2023-10-19 NOTE — Progress Notes (Signed)
 Daily Progress Note  Assessment/Plan:  Principal Problem:   Pain of upper abdomen Active Problems:   Primary adenocarcinoma of ampulla of Vater      History of kidney stones   Lynch syndrome   B12 deficiency           Nicole [name] is a 73 y.o. female who is presenting to [facility] with severe abdominal pain after ERCP with , in the setting of the following pertinent/contributing co-morbidities: Lynch syndrome with history of colon cancer s/p resection, renal cancer, non-Hodgkin's lymphoma, B12 deficiency, and recent diagnosis of ampullary adenocarcinoma   Illness that poses a threat to life or bodily function without appropriate treatment   Abdominal pain s/p EUS with hepaticogastrostomy  Recent diagnosis of ampullary adenocarcinoma She is s/p hepaticogastrostomy with metal and plastic stent placed on day before admission (7/31) with Dr. Minnie. She woke up early on day of admission with severe L>R abdominal pain. CT at OSH ER showed small amount of pneumoperitoneum, which may be an expected post-procedural finding, but she was transferred to [facility] for monitoring and GI evaluation. Her vitals are stable and pain relatively well-controlled after prn opiates at OSH at time of arrival to [facility], without peritonitic abdomen.  Repeat CT revealed Loculated fluid ( measuring 7.3 x 4.8 x 5.0 cm) between the left lobe of the liver, lesser curvature of the stomach and pancreas with adjacent mild stranding inferior to the level of hepaticogastrostomy stent. This collection appears to be moderately increased in size. Also with Small volume free fluid in the lower abdomen/pelvis.  Patient was started on Zosyn  empirically, leukocytosis improving.  However, developed new fever 101.1 on morning 8/2.  Reports feeling better overall.  Denies any nausea or vomiting.  No rebound tenderness or guarding on exam.  But, patient did have 1 episode of sharp, severe abdominal pain overnight around 1030 to 11 PM.  Patient  did have first time solid food last night in few days(scrambled eggs, ice cream).  Unlikely related to the food.  Clinically improving overall.  Has been afebrile for 24 hours. Labs reassuring. - Appreciate GI biliary assistance -following  - Continue IV Zosyn  for now - Follow up blood cultures x 2 from 8/2 - Regular diet for now-per GI recs - CBC, INR, CMP, lipase ordered - Pain management with prn Tylenol , oxy 5 mg, and IV Dilaudid  for breakthrough pain - Prn ODT/IV Zofran  - She has recent prescription for 5 days of Levaquin  but pt did not take it as she was unclear whether she is supposed to be taking this or not.  Zosyn   (8/1 - ) in case she does have procedural complication like perforation     Secondary/Additional Active Problems <redacted file path>:   History of nephrolithiasis - continue home potassium citrate    B12 deficiency - on home B12 injections, held     Prophylaxis - Holding DVT ppx in case of need for surgical intervention   Diet - Regular diet   Code Status / HCDM <redacted file path>  -Full Code, Discussed with patient at the time of admission    Anticipated Medically Ready for Discharge: <redacted file path> 2-3 days   Disposition: Home once medically ready   I personally spent 45 minutes face-to-face and non-face-to-face in the care of this patient, which includes all pre, intra, and post visit time on the date of service.  All documented time was specific to the E/M visit and does not include any procedures that may have been  performed.   ___________________________________________________________________  Subjective: No acute events overnight, Patient states pain well controlled, Denies CP or SOB.  Tolerating solid food.  Encouraged to try low-fat diet, ambulate in the hallway and keep hydrating throughout the day.  Recent Results (from the past 24 hours)  POCT Glucose   Collection Time: 10/18/23 11:34 AM  Result Value Ref Range   Glucose, POC 115 70  - 179 mg/dL  POCT Glucose   Collection Time: 10/18/23  5:10 PM  Result Value Ref Range   Glucose, POC 113 70 - 179 mg/dL  POCT Glucose   Collection Time: 10/18/23  8:17 PM  Result Value Ref Range   Glucose, POC 95 70 - 179 mg/dL  Comprehensive Metabolic Panel   Collection Time: 10/19/23  3:54 AM  Result Value Ref Range   Sodium 141 135 - 145 mmol/L   Potassium 4.1 3.4 - 4.8 mmol/L   Chloride 103 98 - 107 mmol/L   CO2 30.0 20.0 - 31.0 mmol/L   Anion Gap 8 5 - 14 mmol/L   BUN 15 9 - 23 mg/dL   Creatinine 9.17 9.44 - 1.02 mg/dL   BUN/Creatinine Ratio 18    eGFR CKD-EPI (2021) Female 75 >=60 mL/min/1.33m2   Glucose 139 70 - 179 mg/dL   Calcium 8.9 8.7 - 89.5 mg/dL   Albumin 2.6 (L) 3.4 - 5.0 g/dL   Total Protein 5.9 5.7 - 8.2 g/dL   Total Bilirubin 0.6 0.3 - 1.2 mg/dL   AST 17 <=65 U/L   ALT 69 (H) 10 - 49 U/L   Alkaline Phosphatase 202 (H) 46 - 116 U/L  CBC w/ Differential   Collection Time: 10/19/23  3:54 AM  Result Value Ref Range   WBC 11.7 (H) 3.6 - 11.2 10*9/L   RBC 3.81 (L) 3.95 - 5.13 10*12/L   HGB 11.2 (L) 11.3 - 14.9 g/dL   HCT 66.9 (L) 65.9 - 55.9 %   MCV 86.4 77.6 - 95.7 fL   MCH 29.5 25.9 - 32.4 pg   MCHC 34.1 32.0 - 36.0 g/dL   RDW 83.6 (H) 87.7 - 84.7 %   MPV 8.7 6.8 - 10.7 fL   Platelet 143 (L) 150 - 450 10*9/L   Neutrophils % 78.9 %   Lymphocytes % 6.5 %   Monocytes % 14.0 %   Eosinophils % 0.4 %   Basophils % 0.2 %   Absolute Neutrophils 9.2 (H) 1.8 - 7.8 10*9/L   Absolute Lymphocytes 0.8 (L) 1.1 - 3.6 10*9/L   Absolute Monocytes 1.6 (H) 0.3 - 0.8 10*9/L   Absolute Eosinophils 0.1 0.0 - 0.5 10*9/L   Absolute Basophils 0.0 0.0 - 0.1 10*9/L   Anisocytosis Slight (A) Not Present  POCT Glucose   Collection Time: 10/19/23  8:10 AM  Result Value Ref Range   Glucose, POC 101 70 - 179 mg/dL  Urinalysis with Microscopy   Collection Time: 10/19/23  9:01 AM  Result Value Ref Range   Color, UA Yellow    Clarity, UA Hazy    Specific Gravity, UA 1.020  1.003 - 1.030   pH, UA 6.0 5.0 - 9.0   Leukocyte Esterase, UA Negative Negative   Nitrite, UA Negative Negative   Protein, UA 30 mg/dL (A) Negative   Glucose, UA Negative Negative   Ketones, UA Trace (A) Negative   Urobilinogen, UA <2.0 mg/dL <7.9 mg/dL   Bilirubin, UA Negative Negative   Blood, UA Negative Negative   RBC, UA 2 <=4 /HPF  WBC, UA 1 0 - 5 /HPF   Squam Epithel, UA 9 (H) 0 - 5 /HPF   Yeast, UA Many (A) None Seen /HPF   Bacteria, UA None Seen None Seen /HPF   Hyaline Casts, UA 1 0 - 1 /LPF   Uric Acid Crystal, UA Rare /HPF   Labs/Studies: Labs and Studies from the last 24hrs per EMR and Reviewed  Objective: Temp:  [36.5 C (97.7 F)-37.4 C (99.3 F)] 36.8 C (98.2 F) Pulse:  [89-101] 101 Resp:  [16-17] 16 BP: (139-159)/(62-79) 140/69 SpO2:  [90 %-100 %] 90 %  General: Laying in bed,  obese, alert, conversant, cooperative, and in no distress. Eyes: No scleral icterus. Clear conjunctivae. EOMi. ENT: Normal appearing nose. Oropharyngeal mucus membranes moist & pink. Neck: No lymphadenopathy. Cardiovascular: Regular rate and rhythm. No murmur. Normal JVP. Extremities: Warm to touch. Regular 2+ radial pulses bilaterally. No LE edema. Respiratory: Normal respiratory effort on room air Clear to auscultation bilaterally. Gastrointestinal: Soft, mild tenderness- which is improving from prior, non-distended with normoactive bowel sounds. Neurologic: Alert and fully oriented. Normal speech and language. CAM negative. Moving all extremities purposefully.  Skin: Skin is warm, dry and intact. No rash. Psychiatric:  Mood and affect are normal. Speech and behavior are normal.

## 2023-10-20 ENCOUNTER — Inpatient Hospital Stay: Admitting: Oncology

## 2023-10-20 ENCOUNTER — Inpatient Hospital Stay

## 2023-10-21 ENCOUNTER — Inpatient Hospital Stay: Admitting: Family Medicine

## 2023-10-23 ENCOUNTER — Ambulatory Visit (HOSPITAL_COMMUNITY)
Admission: RE | Admit: 2023-10-23 | Discharge: 2023-10-23 | Disposition: A | Discharge status: Home / Self Care | Source: Ambulatory Visit | Attending: Hematology | Admitting: Hematology

## 2023-10-23 DIAGNOSIS — C241 Malignant neoplasm of ampulla of Vater: Secondary | ICD-10-CM | POA: Insufficient documentation

## 2023-10-23 DIAGNOSIS — C249 Malignant neoplasm of biliary tract, unspecified: Secondary | ICD-10-CM | POA: Diagnosis not present

## 2023-10-23 MED ORDER — FLUDEOXYGLUCOSE F - 18 (FDG) INJECTION
9.2400 | Freq: Once | INTRAVENOUS | Status: AC | PRN
  Administered 2023-10-23: 9.24 via INTRAVENOUS

## 2023-10-24 MED FILL — Hydromorphone HCl Inj 2 MG/ML: INTRAMUSCULAR | Qty: 1 | Status: AC

## 2023-10-26 NOTE — Progress Notes (Signed)
 REFERRING PHYSICIAN:  Self PROVIDER:  LEONOR MACARIO DAWN, MD MRN: [id number] DOB: 1950-09-27 DATE OF ENCOUNTER: 10/27/2023 Subjective    Chief Complaint: New Consultation ( NEW CANCER - periampullary)   History of Present Illness: Nicole [name] is a 73 y.o. female who is seen today as an office consultation for evaluation of New Consultation ( NEW CANCER - periampullary)  She has had increasing epigastric and back pain for the last several months, which she initially thought was from her gallstones, which she has known about for some time. She was sent for an US  by her PCP, which showed intrahepatic biliary ductal dilation. She then went to the ED and had an MRCP, which showed dilation of the common bile duct, suspicious for an underlying mass or stricture. She underwent an EGD on 7/18, and there was a large periampullary ulcer, which precluded cannulation of the bile duct for ERCP. Biopsies of the ulcer showed gland atypia suspicious for adenocarcinoma. She was referred to GI at [facility] and underwent an EUS on 7/31. This showed a malignant appearing stricture in the distal third of the CBD. A hepaticogastrostomy stent (metal stent and plastic stent) was placed from the left lobe of the liver into the adjacent stomach. She was admitted several days later with an adjacent biloma, and underwent a repeat EUS with drainage of the biloma and placement of a new stent within the existing metal stent. Her alk phos and transaminases are now normal (bilirubin was never elevated). She still endorses decreased appetite.  She has a history of Lynch syndrome and has had 3 prior colon resections for cancer, as well as a partial left nephrectomy for renal cell cancer. Her son died of metastatic lung cancer at the age of 46. She also reports her son underwent a Whipple when he was younger for pancreatic cancer, and she is very anxious about the prospect of having a Whipple herself. She lives with her granddaughter and  is here today with a close friend.     Review of Systems: A complete review of systems was obtained from the patient.  I have reviewed this information and discussed as appropriate with the patient.  See HPI as well for other ROS.   Medical History: Past Medical History:  Diagnosis Date  . Anemia   . Chronic kidney disease   . History of cancer     There is no problem list on file for this patient.   Past Surgical History:  Procedure Laterality Date  . CHOLECYSTECTOMY       Allergies  Allergen Reactions  . Morphine Other (See Comments)    Hallucinations    Current Outpatient Medications on File Prior to Visit  Medication Sig Dispense Refill  . acetaminophen  (TYLENOL ) 500 MG tablet Take 500 mg by mouth every 6 (six) hours as needed    . biotin  2,500 mcg Cap Take by mouth once daily    . cephalexin  (KEFLEX ) 500 MG capsule Take 500 mg by mouth 2 (two) times daily    . cholecalciferol  (VITAMIN D3) 5,000 unit capsule Take 5,000 Units by mouth once daily    . clobetasoL (TEMOVATE) 0.05 % cream Apply twice daily to wound for 1 month.    . cyanocobalamin  (VITAMIN B12) 1,000 mcg/mL injection ADMINISTER 1 ML(1000 MCG) IN THE MUSCLE EVERY 21 DAYS    . diphenoxylate -atropine  (LOMOTIL ) 2.5-0.025 mg tablet Take 1 tablet by mouth    . FUROsemide  (LASIX ) 40 MG tablet Take 40 mg by mouth  once daily    . ibuprofen  (MOTRIN ) 800 MG tablet Take 1 tablet by mouth 3 times a day for pain and inflammation    . levoFLOXacin  (LEVAQUIN ) 750 MG tablet Take 750 mg by mouth    . magnesium  oxide 400 mg magnesium  Tab Take 400 mg by mouth    . metroNIDAZOLE  (FLAGYL ) 500 MG tablet take one tablet by mouth every twelve hours for 7 days    . mupirocin  (BACTROBAN ) 2 % ointment apply to the affected area(s) twice daily    . potassium citrate  (UROCIT-K ) 15 mEq ER tablet Take 15 mEq by mouth 3 (three) times daily     No current facility-administered medications on file prior to visit.    Family History   Problem Relation Age of Onset  . Colon cancer Mother   . Skin cancer Sister      Social History   Tobacco Use  Smoking Status Never  Smokeless Tobacco Never     Social History   Socioeconomic History  . Marital status: Divorced  Tobacco Use  . Smoking status: Never  . Smokeless tobacco: Never  Substance and Sexual Activity  . Drug use: Never   Social Drivers of Corporate investment banker Strain: Low Risk  (07/30/2023)   Received from [facility]   Overall Financial Resource Strain (CARDIA)   . Difficulty of Paying Living Expenses: Not very hard  Food Insecurity: No Food Insecurity (10/02/2023)   Received from [facility]   Hunger Vital Sign   . Within the past 12 months, you worried that your food would run out before you got the money to buy more.: Never true   . Within the past 12 months, the food you bought just didn't last and you didn't have money to get more.: Never true  Transportation Needs: No Transportation Needs (10/02/2023)   Received from [facility] - Transportation   . In the past 12 months, has lack of transportation kept you from medical appointments or from getting medications?: No   . In the past 12 months, has lack of transportation kept you from meetings, work, or from getting things needed for daily living?: No  Physical Activity: Insufficiently Active (07/27/2023)   Received from [facility]   Exercise Vital Sign   . On average, how many days per week do you engage in moderate to strenuous exercise (like a brisk walk)?: 3 days   . On average, how many minutes do you engage in exercise at this level?: 30 min  Stress: No Stress Concern Present (07/30/2023)   Received from [facility] - Occupational Stress Questionnaire   . Feeling of Stress : Not at all  Social Connections: Moderately Integrated (10/02/2023)   Received from [facility]   Social Connection and Isolation Panel   . In a typical week,  how many times do you talk on the phone with family, friends, or neighbors?: More than three times a week   . How often do you get together with friends or relatives?: Twice a week   . How often do you attend church or religious services?: More than 4 times per year   . Do you belong to any clubs or organizations such as church groups, unions, fraternal or athletic groups, or school groups?: Yes   . How often do you attend meetings of the clubs or organizations you belong to?: More than 4 times per year   .  Are you married, widowed, divorced, separated, never married, or living with a partner?: Divorced  Housing Stability: Low Risk  (10/21/2023)   Received from [facility]   . Within the past 12 months, have you ever stayed: outside, in a car, in a tent, in an overnight shelter, or temporarily in someone else's home(i.e.couch-surfing)?: No   . Are you worried about losing your housing?: No    Objective:   Vitals:   10/27/23 1118  BP: (!) 148/83  Pulse: 109  Temp: 36.1 C (97 F)  SpO2: 99%  Weight: 81.2 kg (179 lb)  Height: 152.4 cm (5')    Body mass index is 34.96 kg/m.  Physical Exam Vitals reviewed.  Constitutional:      General: She is not in acute distress.    Appearance: Normal appearance.  Eyes:     General: No scleral icterus.    Conjunctiva/sclera: Conjunctivae normal.  Pulmonary:     Effort: Pulmonary effort is normal. No respiratory distress.     Breath sounds: Normal breath sounds.  Abdominal:     General: There is no distension.     Palpations: Abdomen is soft.     Tenderness: There is no abdominal tenderness.     Comments: Well-healed midline laparotomy scar.  Musculoskeletal:        General: Normal range of motion.  Skin:    General: Skin is warm and dry.     Coloration: Skin is not jaundiced.  Neurological:     General: No focal deficit present.     Mental Status: She is alert and oriented to person, place, and time.         Assessment and Plan:     Diagnoses and all orders for this visit:  Cancer of ampulla of Vater (CMS/HHS-HCC)    73 yo female recently diagnosed with an ampullary adenocarcinoma in the setting of Lynch syndrome. I personally reviewed her labs, imaging and referral notes. She has some retroperitoneal adenopathy but also has a history of follicular lymphoma, and this adenopathy was noted on previous imaging. She had a PET on 8/7 for which the final read is pending, although I do not see any obvious metastatic disease. I did discuss a Whipple procedure with her, including the anticipated hospital course, recovery, and the benefits and risks of bleeding, infection, pancreatic leak, delayed gastric emptying, cardiac event, and respiratory failure. She was also seen by Dr. Lanny in the hospital, and immunotherapy was discussed as an option first if she has MSI high disease, with the potential to avoid surgery if she has a complete response. The patient prefers not to have surgery if there are any other potentially curative treatment options, however is willing to proceed with a Whipple if this is the only potentially curative treatment. I discussed her case with Dr. Lanny, and patient does have MSI high disease. She plans to see Dr. Davonna at [facility]. If she elects to try immunotherapy first, will defer surgery for now but can discuss again in the future if she does not have a complete response. Will discuss with Dr. Davonna and anticipate a final decision regarding treatment later this week.    SHELBY LYNN ALLEN, MD

## 2023-10-27 DIAGNOSIS — C241 Malignant neoplasm of ampulla of Vater: Secondary | ICD-10-CM | POA: Diagnosis not present

## 2023-10-27 NOTE — Patient Instructions (Signed)
 Dr. Dasie will review your case with oncology. If you are not a candidate for immunotherapy, we will proceed with scheduling you for surgery. Your tentative surgery date will be September 4.  If you have questions or concerns prior to your next visit, please call the office at [phone].

## 2023-10-28 ENCOUNTER — Ambulatory Visit (HOSPITAL_COMMUNITY): Payer: PPO

## 2023-10-28 ENCOUNTER — Inpatient Hospital Stay: Payer: PPO | Attending: Hematology

## 2023-10-28 ENCOUNTER — Encounter (HOSPITAL_COMMUNITY): Payer: Self-pay

## 2023-10-28 DIAGNOSIS — Z8711 Personal history of peptic ulcer disease: Secondary | ICD-10-CM | POA: Diagnosis not present

## 2023-10-28 DIAGNOSIS — Z8572 Personal history of non-Hodgkin lymphomas: Secondary | ICD-10-CM | POA: Diagnosis not present

## 2023-10-28 DIAGNOSIS — E538 Deficiency of other specified B group vitamins: Secondary | ICD-10-CM | POA: Insufficient documentation

## 2023-10-28 DIAGNOSIS — Z9049 Acquired absence of other specified parts of digestive tract: Secondary | ICD-10-CM | POA: Diagnosis not present

## 2023-10-28 DIAGNOSIS — Z1509 Genetic susceptibility to other malignant neoplasm: Secondary | ICD-10-CM | POA: Diagnosis not present

## 2023-10-28 DIAGNOSIS — C241 Malignant neoplasm of ampulla of Vater: Secondary | ICD-10-CM | POA: Diagnosis not present

## 2023-10-28 DIAGNOSIS — Z5112 Encounter for antineoplastic immunotherapy: Secondary | ICD-10-CM | POA: Insufficient documentation

## 2023-10-28 DIAGNOSIS — Z79899 Other long term (current) drug therapy: Secondary | ICD-10-CM | POA: Diagnosis not present

## 2023-10-28 DIAGNOSIS — R7989 Other specified abnormal findings of blood chemistry: Secondary | ICD-10-CM | POA: Insufficient documentation

## 2023-10-28 DIAGNOSIS — Z85828 Personal history of other malignant neoplasm of skin: Secondary | ICD-10-CM | POA: Diagnosis not present

## 2023-10-28 DIAGNOSIS — E785 Hyperlipidemia, unspecified: Secondary | ICD-10-CM | POA: Diagnosis not present

## 2023-10-28 DIAGNOSIS — K8070 Calculus of gallbladder and bile duct without cholecystitis without obstruction: Secondary | ICD-10-CM | POA: Insufficient documentation

## 2023-10-28 DIAGNOSIS — Z85528 Personal history of other malignant neoplasm of kidney: Secondary | ICD-10-CM | POA: Diagnosis not present

## 2023-10-28 DIAGNOSIS — K760 Fatty (change of) liver, not elsewhere classified: Secondary | ICD-10-CM | POA: Insufficient documentation

## 2023-10-28 DIAGNOSIS — R1013 Epigastric pain: Secondary | ICD-10-CM | POA: Insufficient documentation

## 2023-10-28 DIAGNOSIS — C182 Malignant neoplasm of ascending colon: Secondary | ICD-10-CM

## 2023-10-28 DIAGNOSIS — Z85038 Personal history of other malignant neoplasm of large intestine: Secondary | ICD-10-CM | POA: Insufficient documentation

## 2023-10-28 DIAGNOSIS — C642 Malignant neoplasm of left kidney, except renal pelvis: Secondary | ICD-10-CM

## 2023-10-28 DIAGNOSIS — M8580 Other specified disorders of bone density and structure, unspecified site: Secondary | ICD-10-CM | POA: Insufficient documentation

## 2023-10-28 LAB — CBC WITH DIFFERENTIAL/PLATELET
Abs Immature Granulocytes: 0.12 K/uL — ABNORMAL HIGH (ref 0.00–0.07)
Basophils Absolute: 0.1 K/uL (ref 0.0–0.1)
Basophils Relative: 1 %
Eosinophils Absolute: 0.4 K/uL (ref 0.0–0.5)
Eosinophils Relative: 4 %
HCT: 41.3 % (ref 36.0–46.0)
Hemoglobin: 13.1 g/dL (ref 12.0–15.0)
Immature Granulocytes: 1 %
Lymphocytes Relative: 16 %
Lymphs Abs: 1.4 K/uL (ref 0.7–4.0)
MCH: 28.7 pg (ref 26.0–34.0)
MCHC: 31.7 g/dL (ref 30.0–36.0)
MCV: 90.4 fL (ref 80.0–100.0)
Monocytes Absolute: 0.9 K/uL (ref 0.1–1.0)
Monocytes Relative: 11 %
Neutro Abs: 5.6 K/uL (ref 1.7–7.7)
Neutrophils Relative %: 67 %
Platelets: 290 K/uL (ref 150–400)
RBC: 4.57 MIL/uL (ref 3.87–5.11)
RDW: 14.6 % (ref 11.5–15.5)
WBC: 8.5 K/uL (ref 4.0–10.5)
nRBC: 0 % (ref 0.0–0.2)

## 2023-10-28 LAB — COMPREHENSIVE METABOLIC PANEL WITH GFR
ALT: 28 U/L (ref 0–44)
AST: 18 U/L (ref 15–41)
Albumin: 3.1 g/dL — ABNORMAL LOW (ref 3.5–5.0)
Alkaline Phosphatase: 107 U/L (ref 38–126)
Anion gap: 9 (ref 5–15)
BUN: 16 mg/dL (ref 8–23)
CO2: 23 mmol/L (ref 22–32)
Calcium: 8.9 mg/dL (ref 8.9–10.3)
Chloride: 108 mmol/L (ref 98–111)
Creatinine, Ser: 0.87 mg/dL (ref 0.44–1.00)
GFR, Estimated: >60 mL/min (ref >60)
Glucose, Bld: 110 mg/dL — ABNORMAL HIGH (ref 70–99)
Potassium: 4.3 mmol/L (ref 3.5–5.1)
Sodium: 140 mmol/L (ref 135–145)
Total Bilirubin: 0.5 mg/dL (ref 0.0–1.2)
Total Protein: 6.9 g/dL (ref 6.5–8.1)

## 2023-10-28 LAB — VITAMIN B12: Vitamin B-12: 472 pg/mL (ref 180–914)

## 2023-10-28 LAB — LACTATE DEHYDROGENASE: LDH: 121 U/L (ref 98–192)

## 2023-10-29 LAB — CEA: CEA: 1.7 ng/mL (ref 0.0–4.7)

## 2023-10-30 ENCOUNTER — Other Ambulatory Visit: Payer: Self-pay | Admitting: *Deleted

## 2023-10-30 ENCOUNTER — Other Ambulatory Visit: Payer: Self-pay

## 2023-10-30 ENCOUNTER — Encounter: Payer: Self-pay | Admitting: General Surgery

## 2023-10-30 ENCOUNTER — Ambulatory Visit: Admitting: General Surgery

## 2023-10-30 ENCOUNTER — Inpatient Hospital Stay: Admitting: Oncology

## 2023-10-30 VITALS — BP 111/75 | HR 108 | Temp 97.5°F | Resp 20 | Ht 59.25 in | Wt 183.2 lb

## 2023-10-30 VITALS — BP 121/81 | HR 100 | Temp 98.2°F | Resp 20 | Ht 59.25 in | Wt 180.8 lb

## 2023-10-30 DIAGNOSIS — C241 Malignant neoplasm of ampulla of Vater: Secondary | ICD-10-CM

## 2023-10-30 DIAGNOSIS — Z5112 Encounter for antineoplastic immunotherapy: Secondary | ICD-10-CM | POA: Diagnosis not present

## 2023-10-30 DIAGNOSIS — C189 Malignant neoplasm of colon, unspecified: Secondary | ICD-10-CM | POA: Diagnosis not present

## 2023-10-30 DIAGNOSIS — Z1509 Genetic susceptibility to other malignant neoplasm: Secondary | ICD-10-CM | POA: Diagnosis not present

## 2023-10-30 DIAGNOSIS — C642 Malignant neoplasm of left kidney, except renal pelvis: Secondary | ICD-10-CM | POA: Diagnosis not present

## 2023-10-30 NOTE — Progress Notes (Addendum)
 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 Summary of oncologic history is as follows:  Diagnosis: Ampullary Adenocarcinoma  -09/23/2023: abdominal Ultrasound: Cholelithiasis with no acute cholecytitis. Dilated common bile duct 1.7cm with mild intrahepatic biliary dilatation. -10/01/2023: Presented to the ER with Intermittent fevers and epigastric pain - 10/01/2023 MRCP: Distended gallbladder containing numerous gallstones. Severe intra and extrahepatic biliary ductal dilatation, the common bile duct measuring up to 1.9 cm in caliber and containing multiple small gallstones dependently. The duct however appears to taper to the ampulla without calculus of the ampulla. Findings are highly suspicious for ampullary stricture or perhaps an occult mass. Enlarged retroperitoneal lymph nodes unchanged when compared to examination dated 10/30/2022, left retroperitoneal nodes measuring up to 1.7 x 1.2 cm. - 10/03/2023 ERCP:  Duodenal ulcer 25 mm in its largest dimension that was biopsied. Pathology of duodenal ulcer: Ulcer with glandular atypia suspicious for adenocarcinoma. - 10/04/2023 CT AP: There is some thickening in the area of the pancreatic head with a small cystic area and narrowing of the distal common duct at the level of the ampulla with some mucosal thickening. Recommend further evaluation to exclude an ampullary lesion when clinically appropriate. Stable multifocal atrophy of the left kidney with distortion and stones. Stable numerous enlarged lymph nodes in the retroperitoneum and retrocrural spaces going back to August 2024.  - 10/04/2023 CEA: 2.4, CA 19-9: 13  - 10/16/2023 EUS/ERCP with biopsy and stent placement. Pathology of ampulla of Vater mass biopsy: Invasive adenocarcinoma, moderately  differentiated with mucinous features, MMR deficient. Comments: Immunostains for mismatch repair (MMR) protein expression are performed (A1). MSI-H: There is loss of nuclear expression of MLH1 and PMS2, and intact nuclear expression of MSH2, and MSH6 within the tumor cells.  - 10/17/2023 CT AP: Suspected tumor within the descending duodenum, ampulla, or head of pancreas. The lesion is ill-defined, and the margins are difficult to measure. Abdominal adenopathy, unchanged from the previous exam. Interval placement of a transbiliary biliary-to-gastric drainage catheter with multiple gallstones and mild gallbladder wall thickening. Interval improvement in fusiform dilatation of the common bile duct. Multiple common bile duct stones are again seen.  - 10/23/2023: PET CT: Multiple tracer avid lymph nodes in the cervical, axillary, supraclavicular, internal mammary, paratracheal, and retroperitoneal regions. Suspicious for nodal metastasis. If tissue confirmation as clinically indicated ultrasound-guided percutaneous needle biopsy of the supraclavicular or axillary lymph nodes may be considered. Mild radiotracer activity in the region of the descending duodenum/head of pancreas with SUV max of 4.3. this corresponds to the location of the known malignancy. Transbiliary biliary-to-gastric drainage catheter with multiple gallstones and mild gallbladder wall thickening. Pneumobilia is identified. Increased uptake within the left lobe of liver along the catheter track is likely inflammatory in nature.   Lynch syndrome:  - MLH1 loss of expression - Multiple colon cancer s/p colectomy - TAH and BSO in 1993 - Left renal cell carcinoma - Follicular lymphoma -Multiple skin cancer status post Mohs procedure - Extensive family history of multiple cancers in multiple family members  Left Renal Clear Cell Carcinoma:  -04/17/2005: Left partial nephrectomy, stage T1 clear-cell renal cell carcinoma. Furhman nuclear grade 3 of  4. Margins free to tumor - 04/21/2018: MRI of abdomen showed old left upper pole renal lesion suspicious for recurrent renal cell carcinoma. - 05/20/2018: Left renal mass biopsy and cryoablation-pathology consistent  with clear-cell renal cell carcinoma - CT CAP 10/30/2022: NED  Colon cancer: (mostly per documentation- limited records)  - 08/30/2005: Colonoscopy: Distal sigmoid colon biopsy: Adenocarcinoma - 10/07/2005: [facility] Path review: Loss of expression of MLH1.  MSI high - Colon cancerx3, s/p subtotal colectomy with ileorectal anastomosis by Dr. Cathlyn Samples at [facility] on 12/11/2005 - No adjuvant chemotherapy  Follicular lymphoma (per documentation- limited records):  - Well-differentiated with CD20 positivity, grade 1 found at the time of sigmoid colon resection in 2007 with 2 involved lymph nodes.   History of Presenting Illness: Nicole [name] 73 y.o. female is referred by Leonor Macario Dawn, MD for ampullary carcinoma. Patient is accompanied by a family member and her son on the phone.   Patient has a medical history of Lynch syndrome, 3 diagnoses of colon carcinoma, renal cell carcinoma, non-Hodgkin's lymphoma, skin cancer, anemia, vitamin B12 deficiency, neuromuscular disorder, osteopenia, prediabetes, hyperlipidemia, and choledocholithiasis. She has a surgical history of colectomy x3 (last in 2007) and left partial nephrectomy in 2007. Nicole [name] was previously treated by Dr. Gerldine for colon carcinoma with colectomies and did not receive any chemotherapy.   She reported episodes of epigastric pain beginning on 07/29/2023 at a visit with her PCP and was referred to GI after labs found elevated LFT's and imaging showed dilated CBD as well as a fatty liver. Nicole [name] was seen by Dr. Cinderella [gastroenterologist] on 10/01/2023 who discussed the option of going to the ER to have imaging and blood work done stat, which the patient proceeded with.   She was admitted to [facility] from  10/02/2023 to 10/08/2023 with MRCP that showed a distended gall bladder with numerous stones and severe intra and extra-hepatic dilation with CBD measuring 1.9 cm. MRCP also showed tapering to the ampulla without visible stones. Nicole [name] underwent EGD as inpatient on 10/03/2023 that found: an ulcer in the duodenum. Pathology of ulcer revealed: Ulcer with glandular atypia suspicious for adenocarcinoma. Nicole [name] attributed occurrence of ulcer in duodenum to chronic daily ibuprofen  use.  CEA was at 2.4 and CA 19-9 was at 13.  At discharge, she was scheduled follow-up with [facility] for EUS/ERCP. Nicole [name] underwent EUS with hepaticogastrostomy and stent placement on 10/16/2023.  She had a televisit with Dr. Lanny [oncologist] on 10/15/2023, who recommended PET. Dr. Lanny also recommended immunotherapy with Sharion if Daisee's tumor had MMR deficiency.  She was seen by Dr. Ileana during hospitalization at [facility].  She presented to the ED at [facility] on 10/17/2023 for epigastric pain after having ERCP with biopsy and stent placement the day before at [facility]. CT AP done in the ED showed a small amount of free air from the stent placement. Findings were discussed with [facility] and she transferred to their hospital the same day. She remained hospitalized at [facility] from 10/17/2023 to 10/22/2023. Repeat CT showed loculated fluid between the left lobe of the liver measuring 7.3 x 4.8 x 5.0 cm, lesser curvature of the stomach and pancreas with adjacent mild stranding inferior to the level of hepaticogastrostomy stent. Nicole [name] then underwent repeat ERCP on 10/21/2023 which found the hepaticogastrostomy stent had migrated internally and had her stent replaced. A biloma (63mmx20mm) was found, fluid was aspirated and sent for cultures. Cultures were negative.   Nicole [name] was seen by Dr. Dawn Kansky Surgery] at [facility] surgery on 10/27/2023 who discussed possible surgical treatments of ampullary carcinoma.  But patient is very reluctant to get Whipple  surgery as she had a son who had Whipple  surgery and had a lot of complications.  She is a retired Artist. She lives in [place] with her granddaughter. Non-smoker. No ETOH use. Independent of ADL's and IADL's prior to ERCP with stent placement and still continues to try to be active.   Medical History: Past Medical History:  Diagnosis Date   Allergy ?   Anemia    Arthritis    shorulder   B12 deficiency    Cancer (HCC) 2001   Chronic diarrhea SECONDARY HX COLON CANCER   Colon cancer (HCC)    History of colon cancer [facility] SYNDROME ---  COLORECTAL CANCER S/P COLECTOMY X3  LAST ONE 2007   NO RECURRENCE   History of colon cancer 05/04/2013   History of kidney infection 08/2013   History of kidney stones    History of MRSA infection 08/2013   left leg   Hx of osteopenia    Hyperlipidemia ?   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   NHL (non-Hodgkin's lymphoma) (HCC) 10/17/2010   MONITORED BY DR PERNELL   Personal history of renal cancer S/P LEFT PARTIAL NEPHRECTOMY 2007--  NO RECURRENCE   DUE TO KIDNEY CANCER   Right shoulder pain S/P ROTATOR CUFF REPAIR   Skin cancer     Surgical history: Past Surgical History:  Procedure Laterality Date   ABDOMINAL HYSTERECTOMY     APPENDECTOMY  1993   W/ LAVH   COLON SURGERY  2001,2002,2007   COLONOSCOPY W/ POLYPECTOMY     CYSTOSCOPY W/ RETROGRADES  07/14/2014   Procedure: CYSTOSCOPY WITH RETROGRADE PYELOGRAM;  Surgeon: Garnette Shack, MD;  Location: [facility];  Service: Urology;;   CYSTOSCOPY W/ URETERAL STENT REMOVAL Left 07/14/2014   Procedure: CYSTOSCOPY WITH STENT REMOVAL;  Surgeon: Garnette Shack, MD;  Location: [facility];  Service: Urology;  Laterality: Left;   CYSTOSCOPY WITH RETROGRADE PYELOGRAM, URETEROSCOPY AND STENT PLACEMENT Left 11/30/2020   Procedure: CYSTOSCOPY WITH RETROGRADE PYELOGRAM, URETEROSCOPY/STONE EXTRACTION AND  STENT PLACEMENT;  Surgeon: Shack Garnette, MD;  Location: [facility];  Service: Urology;  Laterality: Left;   CYSTOSCOPY WITH RETROGRADE PYELOGRAM, URETEROSCOPY AND STENT PLACEMENT Left 06/24/2022   Procedure: CYSTOSCOPY WITH RETROGRADE PYELOGRAM, URETEROSCOPY AND STENT PLACEMENT;  Surgeon: Shack Garnette, MD;  Location: [facility];  Service: Urology;  Laterality: Left;   CYSTOSCOPY WITH STENT PLACEMENT  02/07/2012   Procedure: CYSTOSCOPY WITH STENT PLACEMENT;  Surgeon: Garnette Shack, MD;  Location: [facility];  Service: Urology;  Laterality: Left;   CYSTOSCOPY WITH STENT PLACEMENT Left 07/14/2014   Procedure: CYSTOSCOPY WITH STENT PLACEMENT;  Surgeon: Garnette Shack, MD;  Location: [facility];  Service: Urology;  Laterality: Left;   ESOPHAGOGASTRODUODENOSCOPY N/A 10/03/2023   Procedure: EGD (ESOPHAGOGASTRODUODENOSCOPY);  Surgeon: Avram Lupita BRAVO, MD;  Location: [facility] ENDOSCOPY;  Service: Gastroenterology;  Laterality: N/A;  ercp scheduled, but unable to be done due to large ulcer   EYE SURGERY  ?   Removal of styes   FLEXIBLE SIGMOIDOSCOPY  08/29/2011   Procedure: FLEXIBLE SIGMOIDOSCOPY;  Surgeon: Claudis RAYMOND Rivet, MD;  [facility];  Service: Endoscopy;  Laterality: N/A;  930   FLEXIBLE SIGMOIDOSCOPY N/A 09/28/2012   Procedure: FLEXIBLE SIGMOIDOSCOPY;  Surgeon: Claudis RAYMOND Rivet, MD;  [facility];  Service: Endoscopy;  Laterality: N/A;  730   FLEXIBLE SIGMOIDOSCOPY N/A 07/08/2014   Procedure: FLEXIBLE SIGMOIDOSCOPY;  Surgeon: Claudis RAYMOND Rivet, MD;  [facility];  Service: Endoscopy;  Laterality: N/A;  830 - moved to 10:20 - Ann to notify pt   FLEXIBLE SIGMOIDOSCOPY N/A 08/11/2015   Procedure: FLEXIBLE SIGMOIDOSCOPY;  Surgeon: Claudis RAYMOND Rivet, MD;  [facility];  Service: Endoscopy;  Laterality: N/A;  730   FLEXIBLE SIGMOIDOSCOPY N/A 08/13/2016   Procedure: FLEXIBLE SIGMOIDOSCOPY;   Surgeon: Rivet Claudis RAYMOND, MD;  [facility];  Service: Endoscopy;  Laterality: N/A;  730   FLEXIBLE SIGMOIDOSCOPY N/A 10/06/2019   Procedure: FLEXIBLE SIGMOIDOSCOPY;  Surgeon: Rivet Claudis RAYMOND, MD;  [facility];  Service: Endoscopy;  Laterality: N/A;  1245   HEMICOLECTOMY  2001   right   HEMICOLECTOMY  2002   left   HOLMIUM LASER APPLICATION Left 07/14/2014   Procedure: HOLMIUM LASER LITHOTRIPSY,;  Surgeon: Garnette Shack, MD;  Location: [facility];  Service: Urology;  Laterality: Left;   HOLMIUM LASER APPLICATION Left 11/30/2020   Procedure: HOLMIUM LASER APPLICATION;  Surgeon: Shack Garnette, MD;  Location: [facility];  Service: Urology;  Laterality: Left;   HOLMIUM LASER APPLICATION Left 06/24/2022   Procedure: HOLMIUM LASER APPLICATION;  Surgeon: Shack Garnette, MD;  Location: [facility];  Service: Urology;  Laterality: Left;   IR RADIOLOGIST EVAL & MGMT  04/29/2018   IR RADIOLOGIST EVAL & MGMT  10/07/2019   IR RADIOLOGIST EVAL & MGMT  10/11/2020   IR RADIOLOGIST EVAL & MGMT  11/08/2021   kidney resection  2007   left partial   LAPAROSCOPIC ASSISTED VAGINAL HYSTERECTOMY  1993   W/ BILATERAL SALPINGO-OOPHORECTOMY   LEFT FLANK EXPLORATION W/  PARTIAL LEFT NEPHRECTOMY/ LEFT HILAR LYMPH NODE BX  04-17-2005  DR DAHLSTEDT   RENAL CELL CARCINOMA   LITHOTRIPSY  11-22013   x2   NEPHROLITHOTOMY Left 06/09/2014   Procedure: NEPHROLITHOTOMY PERCUTANEOUS;  Surgeon: Garnette Shack, MD;  Location: [facility];  Service: Urology;  Laterality: Left;  with STENT   RADIOLOGY WITH ANESTHESIA Left 05/20/2018   Procedure: CT WITH ANESTHESIA LEFT RENAL CRYOABLATION AND BIOPSY;  Surgeon: Vanice Sharper, MD;  Location: [facility];  Service: Anesthesiology;  Laterality: Left;   RIGHT ROTATOR  CUFF REPAIR/   BICEP REPAIR  10-17-2011  DR SUPPLE   SUBTOTAL COLECTOMY  2007   URETEROSCOPY WITH HOLMIUM LASER LITHOTRIPSY Left 07/14/2014    Procedure: URETEROSCOPY  EXTRACTION OF STONES;  Surgeon: Garnette Shack, MD;  Location: [facility];  Service: Urology;  Laterality: Left;     Allergies:  is allergic to latex, morphine and codeine, and tape.  Medications:  Current Outpatient Medications  Medication Sig Dispense Refill   acetaminophen  (TYLENOL ) 500 MG tablet Take 1,000 mg by mouth every 8 (eight) hours as needed for fever.     BIOTIN  PO Take 1 tablet by mouth daily.     Cholecalciferol  (VITAMIN D3) 125 MCG (5000 UT) CAPS Take 5,000 Units by mouth daily.     cyanocobalamin  (VITAMIN B12) 1000 MCG/ML injection ADMINISTER 1 ML(1000 MCG) IN THE MUSCLE EVERY 21 DAYS 1 mL 17   diphenoxylate -atropine  (LOMOTIL ) 2.5-0.025 MG tablet Take 1 tablet by mouth 4 (four) times daily as needed for diarrhea or loose stools. 60 tablet 6   furosemide  (LASIX ) 40 MG tablet Take 40 mg by mouth daily as needed for fluid.      magnesium  oxide (MAG-OX) 400 (240 Mg) MG tablet Take 400 mg by mouth daily.     oxyCODONE  (OXY IR/ROXICODONE ) 5 MG immediate release tablet Take 5 mg by  mouth every 6 (six) hours as needed.     Potassium Citrate  15 MEQ (1620 MG) TBCR Take 1 tablet by mouth 3 (three) times daily. 255 tablet 1   No current facility-administered medications for this visit.    Review of Systems: Constitutional: Denies fevers, chills or abnormal night sweats Eyes: Denies blurriness of vision, double vision or watery eyes Ears, nose, mouth, throat, and face: Denies mucositis or sore throat Respiratory: Denies cough, dyspnea or wheezes Cardiovascular: Denies palpitation, chest discomfort or lower extremity swelling Gastrointestinal:  Denies nausea, heartburn or change in bowel habits Skin: Denies abnormal skin rashes Lymphatics: Denies new lymphadenopathy or easy bruising Neurological:Denies numbness, tingling or new weaknesses Behavioral/Psych: Mood is stable, no new changes  All other systems were reviewed with the  patient and are negative.  Physical Examination: ECOG PERFORMANCE STATUS: 1 - Symptomatic but completely ambulatory  Vitals:   10/30/23 1249  BP: 111/75  Pulse: (!) 108  Resp: 20  Temp: (!) 97.5 F (36.4 C)  SpO2: 99%   Filed Weights   10/30/23 1249  Weight: 183 lb 3.2 oz (83.1 kg)    GENERAL:alert, no distress and comfortable SKIN: skin color, texture, turgor are normal, no rashes or significant lesions EYES: normal, conjunctiva are pink and non-injected, sclera clear LYMPH:  no palpable lymphadenopathy in the cervical, axillary or inguinal LUNGS: clear to auscultation and percussion with normal breathing effort HEART: regular rate & rhythm and no murmurs and no lower extremity edema ABDOMEN: abdomen soft, non-tender and normal bowel sounds Musculoskeletal:no cyanosis of digits and no clubbing  PSYCH: alert & oriented x 3 with fluent speech NEURO: no focal motor/sensory deficits   Laboratory Data: I have reviewed the data as listed Lab Results  Component Value Date   WBC 8.5 10/28/2023   HGB 13.1 10/28/2023   HCT 41.3 10/28/2023   MCV 90.4 10/28/2023   PLT 290 10/28/2023   Recent Labs    08/21/23 0828 10/01/23 1251 10/08/23 0224 10/17/23 0534 10/28/23 0858  NA  --    < > 137 138 140  K  --    < > 4.4 4.1 4.3  CL  --    < > 104 103 108  CO2  --    < > 25 22 23   GLUCOSE  --    < > 111* 192* 110*  BUN  --    < > 18 16 16   CREATININE  --    < > 0.95 0.87 0.87  CALCIUM  --    < > 8.8* 8.8* 8.9  GFRNONAA  --    < > >60 >60 >60  PROT 6.2   < > 5.6* 6.8 6.9  ALBUMIN  --    < > 2.7* 3.4* 3.1*  AST 19   < > 49* 71* 18  ALT 27   < > 66* 141* 28  ALKPHOS  --    < > 196* 338* 107  BILITOT 0.3   < > 0.7 0.5 0.5  BILIDIR 0.1  --   --   --   --   IBILI 0.2  --   --   --   --    < > = values in this interval not displayed.    Radiographic Studies: I have personally reviewed the radiological images as listed and agreed with the findings in the report.  NM PET  Image Initial (PI) Skull Base To Thigh EXAM: PET AND CT SKULL BASE TO MID [facility] 10/23/2023 12:44:47  PM  TECHNIQUE: None  RADIOPHARMACEUTICAL: 9.24 mCi F-18 FDG Uptake time 55 minutes. Glucose level 101 mg/dl.  PET imaging was acquired from the base of the skull to the mid thighs. Non-contrast enhanced computed tomography was obtained for attenuation correction and anatomic localization.  Blood pool activity: Suv max 2.9.  COMPARISON: CTAB 10/17/2023 and CT chest 10/07/2023.  CLINICAL HISTORY: Gallbladder/biliary cancer, staging.  FINDINGS:  HEAD AND NECK: Bilateral tracer avid cervical lymph nodes are identified. Index left posterior cervical lymph node measures 1 cm with SUV max of 4.5 (axial image 25).  CHEST: Prominent bilateral axillary and supraclavicular lymph nodes demonstrate increased tracer uptake. -Index right axillary node measures 1.4 cm with SUV max of 4.0. The SUV max within the blood pool is equal to 2.9.  -Left supraclavicular lymph node measures 1.2 cm with patchy SUV max of 3.2 (axial image 39).  Asymmetric enlargement of the left internal mammary lymph nodes identified with mild increased uptake. Index left internal mammary node measures 7 mm with SUV max of 2.2. Mild uptake within left paratracheal lymph node is identified (axial image 43).  No tracer avid lung nodules. Aortic atherosclerosis and coronary artery calcifications. Mild cardiac enlargement. No pericardial effusion. Trace left pleural effusion.  ABDOMEN AND PELVIS: No signs of tracer avid liver metastasis. Mild radiotracer activity is identified in the region of the descending duodenum/head of pancreas with SUV max of 4.3 (axial image 89).  Enlarged bilateral retroperitoneal lymph nodes are identified. -Index left retroperitoneal node measures 1.5 cm short axis with SUV max of 3.9 (axial image 90). -Index aortocaval lymph node measures 1.1 cm with SUV max of 4.3 (image 97). No  tracer avid pelvic lymph nodes.  Transbiliary biliary-to-gastric drainage catheter with multiple gallstones and mild gallbladder wall thickening. Pneumobilia is identified. Increased uptake within the left lobe of liver along the catheter track is likely inflammatory in nature.  BONES AND SOFT TISSUE: No tracer avid bone lesions.  Chronic findings include aortic atherosclerosis. Left renal cortical scarring is noted with multiple calcifications in the inferior pole measuring up to 8 mm. Signs of previous subtotal colectomy with primary anastomosis in the left pelvis. Interval resolution of previously noted pneumoperitoneum.  IMPRESSION: 1. Multiple tracer avid lymph nodes in the cervical, axillary, supraclavicular, internal mammary, paratracheal, and retroperitoneal regions. Suspicious for nodal metastasis. If tissue confirmation as clinically indicated ultrasound-guided percutaneous needle biopsy of the supraclavicular or axillary lymph nodes may be considered. 2. Mild radiotracer activity in the region of the descending duodenum/head of pancreas with SUV max of 4.3. this corresponds to the location of the known malignancy. No well-defined mass identified in this area to measure anatomically. 3. Transbiliary biliary-to-gastric drainage catheter with multiple gallstones and mild gallbladder wall thickening. Pneumobilia is identified. Increased uptake within the left lobe of liver along the catheter track is likely inflammatory in nature.  Electronically signed by: Waddell Calk MD 10/30/2023 08:29 AM EDT RP Workstation: [id number]    ASSESSMENT & PLAN:  Patient is a 73 y.o. female presenting for newly diagnosed ampullary adenocarcinoma in the setting of Lynch syndrome  Assessment & Plan Ampullary carcinoma [facility]) Patient has newly diagnosed papillary adenocarcinoma in the setting of Lynch syndrome We discussed Whipple's procedure with Dr. Dasie and is reluctant to assess this  considering her son had this procedure when he was very young and had complications from it PET scan showed duodenal/head of pancreas mass along with multiple lymph nodes in cervical, axillary, supraclavicular, internal mammary, paratracheal and retroperitoneal regions. Normal  CEA and CA 19-9  - Discussed the PET scan findings in detail and that this can represent follicular lymphoma.  Will obtain an excisional biopsy of one of the accessible lymph nodes to confirm the diagnosis and to rule out metastatic disease - Discussed that the best treatment option with Lynch syndrome would be immunotherapy either single agent pembrolizumab or dual agent ipilimumab and nivolumab.  Discussed in detail that patients with Lynch syndrome respond poorly to chemotherapy but have shown to have good response with immunotherapy.  Patient chose to go ahead with single agent immunotherapy pembrolizumab. - Discussed risk versus benefits in detail along with most common side effects detected with pembrolizumab including rash, diarrhea, thyroid  abnormalities, possible pneumonitis and hepatitis. -Discussed that goal of immunotherapy would be to see if there will be resolution of carcinoma so that patient can avoid Whipple procedure. - Patient reported that she has poor veins and would like to get a port placed.  Will place a surgery consult for this along with excisional lymph node biopsy - Unable to find genetic report.  Will refer for genetic evaluation - Will schedule for chemotherapy education session  Return to clinic after lymph node biopsy to discuss results and start immunotherapy. Lynch syndrome Patient has MLH1 loss of expression, MSI high consistent with Lynch syndrome  - Patient was undergoing frequent surveillance for Lynch syndrome - Reportedly had genetic evaluation done at [facility] in 2007.  Will refer to genetic counseling at this time as I could not find the report Renal cell carcinoma of left kidney  (HCC) History of left renal cell carcinoma s/p partial nephrectomy Recurrence s/p ablation Patient did not receive adjuvant therapy-no indication.  - Continue to follow with urology Malignant neoplasm of colon, unspecified part of colon [facility]) Patient with a history of colon cancer diagnosed in 2007 reportedly 3 times s/p hemicolectomy with ileocecal anastomosis Was undergoing regular surveillance for Lynch syndrome    Orders Placed This Encounter  Procedures   Ambulatory referral to General Surgery    Referral Priority:   Routine    Referral Type:   Surgical    Referral Reason:   Specialty Services Required    Referred to Provider:   Mavis Anes, MD    Requested Specialty:   General Surgery    Number of Visits Requested:   1    The total time spent in the appointment was 60 minutes encounter with patients including review of chart and various tests results, discussions about plan of care and coordination of care plan   All questions were answered. The patient knows to call the clinic with any problems, questions or concerns. No barriers to learning was detected.  Mickiel Dry, MD 8/15/202512:22 AM

## 2023-10-30 NOTE — Progress Notes (Signed)
 Nicole [name]; [id number]; [birth year]   HPI Patient is a 73 year old white female who was referred to my care by Dr. Davonna of oncology for Port-A-Cath placement and the lymph node biopsy.  Patient was recently diagnosed with ampullary carcinoma.  She is being evaluated as to whether she needs neoadjuvant therapy and/for surgery.  She underwent a recent PET scan which revealed multiple abnormal axillary and supraclavicular lymph nodes.  Dr. Dasie is her surgeon in [place]. Past Medical History:  Diagnosis Date   Allergy ?   Anemia    Arthritis    shorulder   B12 deficiency    Cancer (HCC) 2001   Chronic diarrhea SECONDARY HX COLON CANCER   Colon cancer (HCC)    History of colon cancer [facility] SYNDROME ---  COLORECTAL CANCER S/P COLECTOMY X3  LAST ONE 2007   NO RECURRENCE   History of colon cancer 05/04/2013   History of kidney infection 08/2013   History of kidney stones    History of MRSA infection 08/2013   left leg   Hx of osteopenia    Hyperlipidemia ?   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   NHL (non-Hodgkin's lymphoma) (HCC) 10/17/2010   MONITORED BY DR PERNELL   Personal history of renal cancer S/P LEFT PARTIAL NEPHRECTOMY 2007--  NO RECURRENCE   DUE TO KIDNEY CANCER   Right shoulder pain S/P ROTATOR CUFF REPAIR   Skin cancer     Past Surgical History:  Procedure Laterality Date   ABDOMINAL HYSTERECTOMY     APPENDECTOMY  1993   W/ LAVH   COLON SURGERY  2001,2002,2007   COLONOSCOPY W/ POLYPECTOMY     CYSTOSCOPY W/ RETROGRADES  07/14/2014   Procedure: CYSTOSCOPY WITH RETROGRADE PYELOGRAM;  Surgeon: Garnette Shack, MD;  Location: [facility];  Service: Urology;;   CYSTOSCOPY W/ URETERAL STENT REMOVAL Left 07/14/2014   Procedure: CYSTOSCOPY WITH STENT REMOVAL;  Surgeon: Garnette Shack, MD;  Location: [facility];  Service: Urology;  Laterality: Left;   CYSTOSCOPY WITH RETROGRADE  PYELOGRAM, URETEROSCOPY AND STENT PLACEMENT Left 11/30/2020   Procedure: CYSTOSCOPY WITH RETROGRADE PYELOGRAM, URETEROSCOPY/STONE EXTRACTION AND STENT PLACEMENT;  Surgeon: Shack Garnette, MD;  Location: [facility];  Service: Urology;  Laterality: Left;   CYSTOSCOPY WITH RETROGRADE PYELOGRAM, URETEROSCOPY AND STENT PLACEMENT Left 06/24/2022   Procedure: CYSTOSCOPY WITH RETROGRADE PYELOGRAM, URETEROSCOPY AND STENT PLACEMENT;  Surgeon: Shack Garnette, MD;  Location: [facility];  Service: Urology;  Laterality: Left;   CYSTOSCOPY WITH STENT PLACEMENT  02/07/2012   Procedure: CYSTOSCOPY WITH STENT PLACEMENT;  Surgeon: Garnette Shack, MD;  Location: [facility];  Service: Urology;  Laterality: Left;   CYSTOSCOPY WITH STENT PLACEMENT Left 07/14/2014   Procedure: CYSTOSCOPY WITH STENT PLACEMENT;  Surgeon: Garnette Shack, MD;  Location: [facility];  Service: Urology;  Laterality: Left;   ESOPHAGOGASTRODUODENOSCOPY N/A 10/03/2023   Procedure: EGD (ESOPHAGOGASTRODUODENOSCOPY);  Surgeon: Avram Lupita BRAVO, MD;  Location: [facility] END[facility]COPY;  Service: Gastroenterology;  Laterality: N/A;  ercp scheduled, but unable to be done due to large ulcer   EYE SURGERY  ?   Removal of styes   FLEXIBLE SIGMOIDOSCOPY  08/29/2011   Procedure: FLEXIBLE SIGMOIDOSCOPY;  Surgeon: Claudis RAYMOND Rivet, MD;  [facility];  Service: Endoscopy;  Laterality: N/A;  930   FLEXIBLE SIGMOIDOSCOPY N/A 09/28/2012   Procedure: FLEXIBLE SIGMOIDOSCOPY;  Surgeon: Claudis RAYMOND Rivet, MD;  [facility];  Service: Endoscopy;  Laterality: N/A;  730   FLEXIBLE SIGMOIDOSCOPY N/A 07/08/2014   Procedure: FLEXIBLE SIGMOIDOSCOPY;  Surgeon: Claudis RAYMOND Rivet, MD;  [facility];  Service: Endoscopy;  Laterality: N/A;  830 - moved to 10:20 - Ann to notify pt   FLEXIBLE SIGMOIDOSCOPY N/A 08/11/2015   Procedure: FLEXIBLE SIGMOIDOSCOPY;  Surgeon: Claudis RAYMOND Rivet, MD;   [facility];  Service: Endoscopy;  Laterality: N/A;  730   FLEXIBLE SIGMOIDOSCOPY N/A 08/13/2016   Procedure: FLEXIBLE SIGMOIDOSCOPY;  Surgeon: Rivet Claudis RAYMOND, MD;  [facility];  Service: Endoscopy;  Laterality: N/A;  730   FLEXIBLE SIGMOIDOSCOPY N/A 10/06/2019   Procedure: FLEXIBLE SIGMOIDOSCOPY;  Surgeon: Rivet Claudis RAYMOND, MD;  [facility];  Service: Endoscopy;  Laterality: N/A;  1245   HEMICOLECTOMY  2001   right   HEMICOLECTOMY  2002   left   HOLMIUM LASER APPLICATION Left 07/14/2014   Procedure: HOLMIUM LASER LITHOTRIPSY,;  Surgeon: Garnette Shack, MD;  Location: [facility];  Service: Urology;  Laterality: Left;   HOLMIUM LASER APPLICATION Left 11/30/2020   Procedure: HOLMIUM LASER APPLICATION;  Surgeon: Shack Garnette, MD;  Location: [facility];  Service: Urology;  Laterality: Left;   HOLMIUM LASER APPLICATION Left 06/24/2022   Procedure: HOLMIUM LASER APPLICATION;  Surgeon: Shack Garnette, MD;  Location: [facility];  Service: Urology;  Laterality: Left;   IR RADIOLOGIST EVAL & MGMT  04/29/2018   IR RADIOLOGIST EVAL & MGMT  10/07/2019   IR RADIOLOGIST EVAL & MGMT  10/11/2020   IR RADIOLOGIST EVAL & MGMT  11/08/2021   kidney resection  2007   left partial   LAPAROSCOPIC ASSISTED VAGINAL HYSTERECTOMY  1993   W/ BILATERAL SALPINGO-OOPHORECTOMY   LEFT FLANK EXPLORATION W/  PARTIAL LEFT NEPHRECTOMY/ LEFT HILAR LYMPH NODE BX  04-17-2005  DR DAHLSTEDT   RENAL CELL CARCINOMA   LITHOTRIPSY  11-22013   x2   NEPHROLITHOTOMY Left 06/09/2014   Procedure: NEPHROLITHOTOMY PERCUTANEOUS;  Surgeon: Garnette Shack, MD;  Location: [facility];  Service: Urology;  Laterality: Left;  with STENT   RADIOLOGY WITH ANESTHESIA Left 05/20/2018   Procedure: CT WITH ANESTHESIA LEFT RENAL CRYOABLATION AND BIOPSY;  Surgeon: Vanice Sharper, MD;  Location: [facility];  Service: Anesthesiology;  Laterality: Left;   RIGHT  ROTATOR  CUFF REPAIR/   BICEP REPAIR  10-17-2011  DR SUPPLE   SUBTOTAL COLECTOMY  2007   URETEROSCOPY WITH HOLMIUM LASER LITHOTRIPSY Left 07/14/2014   Procedure: URETEROSCOPY  EXTRACTION OF STONES;  Surgeon: Garnette Shack, MD;  Location: [facility];  Service: Urology;  Laterality: Left;    Family History  Problem Relation Age of Onset   Cancer Son    Early death Son    Cancer Mother    Cancer Maternal Grandfather     Current Outpatient Medications on File Prior to Visit  Medication Sig Dispense Refill   acetaminophen  (TYLENOL ) 500 MG tablet Take 1,000 mg by mouth every 8 (eight) hours as needed for fever.     BIOTIN  PO Take 1 tablet by mouth daily.     Cholecalciferol  (VITAMIN D3) 125 MCG (5000 UT) CAPS Take 5,000 Units by mouth daily.     cyanocobalamin  (VITAMIN B12) 1000 MCG/ML injection ADMINISTER 1 ML(1000 MCG) IN THE MUSCLE EVERY 21 DAYS 1 mL 17   diphenoxylate -atropine  (LOMOTIL ) 2.5-0.025 MG tablet Take 1 tablet by mouth 4 (four) times daily as needed for diarrhea or loose stools. 60 tablet  6   furosemide  (LASIX ) 40 MG tablet Take 40 mg by mouth daily as needed for fluid.      magnesium  oxide (MAG-OX) 400 (240 Mg) MG tablet Take 400 mg by mouth daily.     oxyCODONE  (OXY IR/ROXICODONE ) 5 MG immediate release tablet Take 5 mg by mouth every 6 (six) hours as needed.     Potassium Citrate  15 MEQ (1620 MG) TBCR Take 1 tablet by mouth 3 (three) times daily. 255 tablet 1   No current facility-administered medications on file prior to visit.    Allergies  Allergen Reactions   Latex Rash   Morphine And Codeine Other (See Comments)    Hallucinations    Tape Rash    Certain tapes     Social History   Substance and Sexual Activity  Alcohol Use Not Currently   Comment: 2 glasses of wine per year - maybe    Social History   Tobacco Use  Smoking Status Never  Smokeless Tobacco Never    Review of Systems  Constitutional:  Positive for malaise/fatigue.   HENT: Negative.    Eyes: Negative.   Respiratory: Negative.    Cardiovascular: Negative.   Gastrointestinal:  Positive for heartburn.  Genitourinary: Negative.   Musculoskeletal: Negative.   Skin: Negative.   Neurological:  Positive for sensory change.  Endo/Heme/Allergies: Negative.   Psychiatric/Behavioral: Negative.      Objective   Vitals:   10/30/23 1459 10/30/23 1502  BP: 121/81   Pulse: (!) 110 100  Resp: 20   Temp: 98.2 F (36.8 C)   SpO2: 94%     Physical Exam Vitals reviewed.  Constitutional:      Appearance: Normal appearance. She is not ill-appearing.  HENT:     Head: Normocephalic and atraumatic.  Cardiovascular:     Rate and Rhythm: Normal rate and regular rhythm.     Heart sounds: Normal heart sounds. No murmur heard.    No friction rub. No gallop.  Pulmonary:     Effort: Pulmonary effort is normal. No respiratory distress.     Breath sounds: Normal breath sounds. No stridor. No wheezing, rhonchi or rales.  Skin:    General: Skin is warm and dry.  Neurological:     Mental Status: She is alert and oriented to person, place, and time.   Axilla: I was able to palpate some abnormal lymph nodes in the right axilla.  PET scan images were used to confirm my physical examination.  Assessment  Ampullary carcinoma, need for central venous access Need for axillary lymph node biopsy for staging purposes. Plan  Patient is scheduled for Port-A-Cath insertion, right axillary lymph node biopsy on 11/03/2023.  The risks and benefits of both procedures including bleeding, infection, and pneumothorax were fully explained to the patient, who gave informed consent.

## 2023-10-30 NOTE — Addendum Note (Signed)
 Addended by: SAUNDRA TAWNI DEL on: 10/30/2023 04:00 PM   Modules accepted: Orders

## 2023-10-30 NOTE — H&P (Signed)
 Nicole [name]; [id number]; [birth year]   HPI Patient is a 73 year old white female who was referred to my care by Dr. Davonna of oncology for Port-A-Cath placement and the lymph node biopsy.  Patient was recently diagnosed with ampullary carcinoma.  She is being evaluated as to whether she needs neoadjuvant therapy and/for surgery.  She underwent a recent PET scan which revealed multiple abnormal axillary and supraclavicular lymph nodes.  Dr. Dasie is her surgeon in [place]. Past Medical History:  Diagnosis Date   Allergy ?   Anemia    Arthritis    shorulder   B12 deficiency    Cancer (HCC) 2001   Chronic diarrhea SECONDARY HX COLON CANCER   Colon cancer (HCC)    History of colon cancer [facility] SYNDROME ---  COLORECTAL CANCER S/P COLECTOMY X3  LAST ONE 2007   NO RECURRENCE   History of colon cancer 05/04/2013   History of kidney infection 08/2013   History of kidney stones    History of MRSA infection 08/2013   left leg   Hx of osteopenia    Hyperlipidemia ?   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   NHL (non-Hodgkin's lymphoma) (HCC) 10/17/2010   MONITORED BY DR PERNELL   Personal history of renal cancer S/P LEFT PARTIAL NEPHRECTOMY 2007--  NO RECURRENCE   DUE TO KIDNEY CANCER   Right shoulder pain S/P ROTATOR CUFF REPAIR   Skin cancer     Past Surgical History:  Procedure Laterality Date   ABDOMINAL HYSTERECTOMY     APPENDECTOMY  1993   W/ LAVH   COLON SURGERY  2001,2002,2007   COLONOSCOPY W/ POLYPECTOMY     CYSTOSCOPY W/ RETROGRADES  07/14/2014   Procedure: CYSTOSCOPY WITH RETROGRADE PYELOGRAM;  Surgeon: Garnette Shack, MD;  Location: [facility];  Service: Urology;;   CYSTOSCOPY W/ URETERAL STENT REMOVAL Left 07/14/2014   Procedure: CYSTOSCOPY WITH STENT REMOVAL;  Surgeon: Garnette Shack, MD;  Location: [facility];  Service: Urology;  Laterality: Left;   CYSTOSCOPY WITH RETROGRADE  PYELOGRAM, URETEROSCOPY AND STENT PLACEMENT Left 11/30/2020   Procedure: CYSTOSCOPY WITH RETROGRADE PYELOGRAM, URETEROSCOPY/STONE EXTRACTION AND STENT PLACEMENT;  Surgeon: Shack Garnette, MD;  Location: [facility];  Service: Urology;  Laterality: Left;   CYSTOSCOPY WITH RETROGRADE PYELOGRAM, URETEROSCOPY AND STENT PLACEMENT Left 06/24/2022   Procedure: CYSTOSCOPY WITH RETROGRADE PYELOGRAM, URETEROSCOPY AND STENT PLACEMENT;  Surgeon: Shack Garnette, MD;  Location: [facility];  Service: Urology;  Laterality: Left;   CYSTOSCOPY WITH STENT PLACEMENT  02/07/2012   Procedure: CYSTOSCOPY WITH STENT PLACEMENT;  Surgeon: Garnette Shack, MD;  Location: [facility];  Service: Urology;  Laterality: Left;   CYSTOSCOPY WITH STENT PLACEMENT Left 07/14/2014   Procedure: CYSTOSCOPY WITH STENT PLACEMENT;  Surgeon: Garnette Shack, MD;  Location: [facility];  Service: Urology;  Laterality: Left;   ESOPHAGOGASTRODUODENOSCOPY N/A 10/03/2023   Procedure: EGD (ESOPHAGOGASTRODUODENOSCOPY);  Surgeon: Avram Lupita BRAVO, MD;  Location: [facility] ENDOSCOPY;  Service: Gastroenterology;  Laterality: N/A;  ercp scheduled, but unable to be done due to large ulcer   EYE SURGERY  ?   Removal of styes   FLEXIBLE SIGMOIDOSCOPY  08/29/2011   Procedure: FLEXIBLE SIGMOIDOSCOPY;  Surgeon: Claudis RAYMOND Rivet, MD;  [facility];  Service: Endoscopy;  Laterality: N/A;  930   FLEXIBLE SIGMOIDOSCOPY N/A 09/28/2012   Procedure: FLEXIBLE SIGMOIDOSCOPY;  Surgeon: Claudis RAYMOND Rivet, MD;  [facility];  Service: Endoscopy;  Laterality: N/A;  730   FLEXIBLE SIGMOIDOSCOPY N/A 07/08/2014   Procedure: FLEXIBLE SIGMOIDOSCOPY;  Surgeon: Claudis RAYMOND Rivet, MD;  [facility];  Service: Endoscopy;  Laterality: N/A;  830 - moved to 10:20 - Ann to notify pt   FLEXIBLE SIGMOIDOSCOPY N/A 08/11/2015   Procedure: FLEXIBLE SIGMOIDOSCOPY;  Surgeon: Claudis RAYMOND Rivet, MD;   [facility];  Service: Endoscopy;  Laterality: N/A;  730   FLEXIBLE SIGMOIDOSCOPY N/A 08/13/2016   Procedure: FLEXIBLE SIGMOIDOSCOPY;  Surgeon: Rivet Claudis RAYMOND, MD;  [facility];  Service: Endoscopy;  Laterality: N/A;  730   FLEXIBLE SIGMOIDOSCOPY N/A 10/06/2019   Procedure: FLEXIBLE SIGMOIDOSCOPY;  Surgeon: Rivet Claudis RAYMOND, MD;  [facility];  Service: Endoscopy;  Laterality: N/A;  1245   HEMICOLECTOMY  2001   right   HEMICOLECTOMY  2002   left   HOLMIUM LASER APPLICATION Left 07/14/2014   Procedure: HOLMIUM LASER LITHOTRIPSY,;  Surgeon: Garnette Shack, MD;  Location: [facility];  Service: Urology;  Laterality: Left;   HOLMIUM LASER APPLICATION Left 11/30/2020   Procedure: HOLMIUM LASER APPLICATION;  Surgeon: Shack Garnette, MD;  Location: [facility];  Service: Urology;  Laterality: Left;   HOLMIUM LASER APPLICATION Left 06/24/2022   Procedure: HOLMIUM LASER APPLICATION;  Surgeon: Shack Garnette, MD;  Location: [facility];  Service: Urology;  Laterality: Left;   IR RADIOLOGIST EVAL & MGMT  04/29/2018   IR RADIOLOGIST EVAL & MGMT  10/07/2019   IR RADIOLOGIST EVAL & MGMT  10/11/2020   IR RADIOLOGIST EVAL & MGMT  11/08/2021   kidney resection  2007   left partial   LAPAROSCOPIC ASSISTED VAGINAL HYSTERECTOMY  1993   W/ BILATERAL SALPINGO-OOPHORECTOMY   LEFT FLANK EXPLORATION W/  PARTIAL LEFT NEPHRECTOMY/ LEFT HILAR LYMPH NODE BX  04-17-2005  DR DAHLSTEDT   RENAL CELL CARCINOMA   LITHOTRIPSY  11-22013   x2   NEPHROLITHOTOMY Left 06/09/2014   Procedure: NEPHROLITHOTOMY PERCUTANEOUS;  Surgeon: Garnette Shack, MD;  Location: [facility];  Service: Urology;  Laterality: Left;  with STENT   RADIOLOGY WITH ANESTHESIA Left 05/20/2018   Procedure: CT WITH ANESTHESIA LEFT RENAL CRYOABLATION AND BIOPSY;  Surgeon: Vanice Sharper, MD;  Location: [facility];  Service: Anesthesiology;  Laterality: Left;   RIGHT  ROTATOR  CUFF REPAIR/   BICEP REPAIR  10-17-2011  DR SUPPLE   SUBTOTAL COLECTOMY  2007   URETEROSCOPY WITH HOLMIUM LASER LITHOTRIPSY Left 07/14/2014   Procedure: URETEROSCOPY  EXTRACTION OF STONES;  Surgeon: Garnette Shack, MD;  Location: [facility];  Service: Urology;  Laterality: Left;    Family History  Problem Relation Age of Onset   Cancer Son    Early death Son    Cancer Mother    Cancer Maternal Grandfather     Current Outpatient Medications on File Prior to Visit  Medication Sig Dispense Refill   acetaminophen  (TYLENOL ) 500 MG tablet Take 1,000 mg by mouth every 8 (eight) hours as needed for fever.     BIOTIN  PO Take 1 tablet by mouth daily.     Cholecalciferol  (VITAMIN D3) 125 MCG (5000 UT) CAPS Take 5,000 Units by mouth daily.     cyanocobalamin  (VITAMIN B12) 1000 MCG/ML injection ADMINISTER 1 ML(1000 MCG) IN THE MUSCLE EVERY 21 DAYS 1 mL 17   diphenoxylate -atropine  (LOMOTIL ) 2.5-0.025 MG tablet Take 1 tablet by mouth 4 (four) times daily as needed for diarrhea or loose stools. 60 tablet  6   furosemide  (LASIX ) 40 MG tablet Take 40 mg by mouth daily as needed for fluid.      magnesium  oxide (MAG-OX) 400 (240 Mg) MG tablet Take 400 mg by mouth daily.     oxyCODONE  (OXY IR/ROXICODONE ) 5 MG immediate release tablet Take 5 mg by mouth every 6 (six) hours as needed.     Potassium Citrate  15 MEQ (1620 MG) TBCR Take 1 tablet by mouth 3 (three) times daily. 255 tablet 1   No current facility-administered medications on file prior to visit.    Allergies  Allergen Reactions   Latex Rash   Morphine And Codeine Other (See Comments)    Hallucinations    Tape Rash    Certain tapes     Social History   Substance and Sexual Activity  Alcohol Use Not Currently   Comment: 2 glasses of wine per year - maybe    Social History   Tobacco Use  Smoking Status Never  Smokeless Tobacco Never    Review of Systems  Constitutional:  Positive for malaise/fatigue.   HENT: Negative.    Eyes: Negative.   Respiratory: Negative.    Cardiovascular: Negative.   Gastrointestinal:  Positive for heartburn.  Genitourinary: Negative.   Musculoskeletal: Negative.   Skin: Negative.   Neurological:  Positive for sensory change.  Endo/Heme/Allergies: Negative.   Psychiatric/Behavioral: Negative.      Objective   Vitals:   10/30/23 1459 10/30/23 1502  BP: 121/81   Pulse: (!) 110 100  Resp: 20   Temp: 98.2 F (36.8 C)   SpO2: 94%     Physical Exam Vitals reviewed.  Constitutional:      Appearance: Normal appearance. She is not ill-appearing.  HENT:     Head: Normocephalic and atraumatic.  Cardiovascular:     Rate and Rhythm: Normal rate and regular rhythm.     Heart sounds: Normal heart sounds. No murmur heard.    No friction rub. No gallop.  Pulmonary:     Effort: Pulmonary effort is normal. No respiratory distress.     Breath sounds: Normal breath sounds. No stridor. No wheezing, rhonchi or rales.  Skin:    General: Skin is warm and dry.  Neurological:     Mental Status: She is alert and oriented to person, place, and time.   Axilla: I was able to palpate some abnormal lymph nodes in the right axilla.  PET scan images were used to confirm my physical examination.  Assessment  Ampullary carcinoma, need for central venous access Need for axillary lymph node biopsy for staging purposes. Plan  Patient is scheduled for Port-A-Cath insertion, right axillary lymph node biopsy on 11/03/2023.  The risks and benefits of both procedures including bleeding, infection, and pneumothorax were fully explained to the patient, who gave informed consent.

## 2023-10-30 NOTE — Patient Instructions (Addendum)
 [facility]  Discharge Instructions  You were seen and examined today by Dr. Davonna. Dr. Davonna is a medical oncologist, meaning that she specializes in the treatment of cancer diagnoses. Dr. Davonna discussed your past medical history, family history of cancers, and the events that led to you being here today.  You were referred to Dr. Davonna due to ampullary cancer - this is related to your lynch syndrome.   Upon review of your PET scan, there are several enlarged lymph nodes throughout your body. The enlarged lymph nodes could very well related to your follicular lymphoma. Dr. Davonna would like for you to have a biopsy of a lymph node to ensure there is no spread of the ampullary cancer. We will ask Dr. Mavis to do the biopsy.  Dr. Davonna has recommended immunotherapy once every 3 weeks. If you tolerate it well, we can give it every 6 weeks. 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 We will ask Dr. Mavis to place a Port-A-Cath at the same time.  Follow-up as scheduled.  Thank you for choosing [facility] - [facility] to provide your oncology and hematology care.   To afford each patient quality time with our provider, please arrive at least 15 minutes before your scheduled appointment time. You may need to reschedule your appointment if you arrive late (10 or more minutes). Arriving late affects you and other patients whose appointments are after yours.  Also, if you miss three or more appointments without notifying the office, you may be dismissed from the clinic at the provider's discretion.    Again, thank you for choosing [facility].  Our hope is that these requests will decrease the amount of time that you wait before being seen by our physicians.   If you have a lab appointment with the Cancer Center - please note that after April 8th, all labs will be drawn in the cancer center.  You do not have to check in or register with the  main entrance as you have in the past but will complete your check-in at the cancer center.            _____________________________________________________________  Should you have questions after your visit to [facility], please contact our office at [phone] and follow the prompts.  Our office hours are 8:00 a.m. to 4:30 p.m. Monday - Thursday and 8:00 a.m. to 2:30 p.m. Friday.  Please note that voicemails left after 4:00 p.m. may not be returned until the following business day.  We are closed weekends and all major holidays.  You do have access to a nurse 24-7, just call the main number to the clinic [phone] and do not press any options, hold on the line and a nurse will answer the phone.    For prescription refill requests, have your pharmacy contact our office and allow 72 hours.    Masks are no longer required in the cancer centers. If you would like for your care team to wear a mask while they are taking care of you, please let them know. You may have one support person who is at least 74 years old accompany you for your appointments.

## 2023-10-31 ENCOUNTER — Other Ambulatory Visit: Payer: Self-pay

## 2023-10-31 ENCOUNTER — Encounter (HOSPITAL_COMMUNITY)
Admission: RE | Admit: 2023-10-31 | Discharge: 2023-10-31 | Disposition: A | Discharge status: Home / Self Care | Source: Ambulatory Visit | Attending: General Surgery | Admitting: General Surgery

## 2023-10-31 DIAGNOSIS — E538 Deficiency of other specified B group vitamins: Secondary | ICD-10-CM

## 2023-10-31 DIAGNOSIS — C241 Malignant neoplasm of ampulla of Vater: Secondary | ICD-10-CM

## 2023-10-31 DIAGNOSIS — C189 Malignant neoplasm of colon, unspecified: Secondary | ICD-10-CM | POA: Insufficient documentation

## 2023-10-31 MED ORDER — CYANOCOBALAMIN 1000 MCG/ML IJ SOLN: INTRAMUSCULAR | 17 refills | Status: DC

## 2023-10-31 NOTE — Assessment & Plan Note (Addendum)
 Patient with a history of colon cancer diagnosed in 2007 reportedly 3 times s/p hemicolectomy with ileocecal anastomosis Was undergoing regular surveillance for Lynch syndrome

## 2023-10-31 NOTE — Telephone Encounter (Signed)
 Medication refill on Cyanocobalmin injections

## 2023-10-31 NOTE — Assessment & Plan Note (Addendum)
 Patient has newly diagnosed papillary adenocarcinoma in the setting of Lynch syndrome We discussed Whipple's procedure with Dr. Dasie and is reluctant to assess this considering her son had this procedure when he was very young and had complications from it PET scan showed duodenal/head of pancreas mass along with multiple lymph nodes in cervical, axillary, supraclavicular, internal mammary, paratracheal and retroperitoneal regions. Normal CEA and CA 19-9  - Discussed the PET scan findings in detail and that this can represent follicular lymphoma.  Will obtain an excisional biopsy of one of the accessible lymph nodes to confirm the diagnosis and to rule out metastatic disease - Discussed that the best treatment option with Lynch syndrome would be immunotherapy either single agent pembrolizumab or dual agent ipilimumab and nivolumab.  Discussed in detail that patients with Lynch syndrome respond poorly to chemotherapy but have shown to have good response with immunotherapy.  Patient chose to go ahead with single agent immunotherapy pembrolizumab. - Discussed risk versus benefits in detail along with most common side effects detected with pembrolizumab including rash, diarrhea, thyroid  abnormalities, possible pneumonitis and hepatitis. -Discussed that goal of immunotherapy would be to see if there will be resolution of carcinoma so that patient can avoid Whipple procedure. - Patient reported that she has poor veins and would like to get a port placed.  Will place a surgery consult for this along with excisional lymph node biopsy - Unable to find genetic report.  Will refer for genetic evaluation - Will schedule for chemotherapy education session  Return to clinic after lymph node biopsy to discuss results and start immunotherapy.

## 2023-10-31 NOTE — Assessment & Plan Note (Addendum)
 Patient has MLH1 loss of expression, MSI high consistent with Lynch syndrome  - Patient was undergoing frequent surveillance for Lynch syndrome - Reportedly had genetic evaluation done at [facility] in 2007.  Will refer to genetic counseling at this time as I could not find the report

## 2023-10-31 NOTE — Assessment & Plan Note (Addendum)
 History of left renal cell carcinoma s/p partial nephrectomy Recurrence s/p ablation Patient did not receive adjuvant therapy-no indication.  - Continue to follow with urology

## 2023-11-03 ENCOUNTER — Ambulatory Visit (HOSPITAL_COMMUNITY)

## 2023-11-03 ENCOUNTER — Ambulatory Visit (HOSPITAL_BASED_OUTPATIENT_CLINIC_OR_DEPARTMENT_OTHER): Admitting: Anesthesiology

## 2023-11-03 ENCOUNTER — Encounter (HOSPITAL_COMMUNITY): Payer: Self-pay | Admitting: General Surgery

## 2023-11-03 ENCOUNTER — Ambulatory Visit (HOSPITAL_COMMUNITY): Admitting: Anesthesiology

## 2023-11-03 ENCOUNTER — Ambulatory Visit (HOSPITAL_COMMUNITY)
Admission: RE | Admit: 2023-11-03 | Discharge: 2023-11-03 | Disposition: A | Discharge status: Home / Self Care | Attending: General Surgery | Admitting: General Surgery

## 2023-11-03 ENCOUNTER — Other Ambulatory Visit: Payer: Self-pay | Admitting: Oncology

## 2023-11-03 ENCOUNTER — Encounter (HOSPITAL_COMMUNITY)
Admission: RE | Disposition: A | Discharge status: Home / Self Care | Payer: Self-pay | Source: Home / Self Care | Attending: General Surgery

## 2023-11-03 DIAGNOSIS — E782 Mixed hyperlipidemia: Secondary | ICD-10-CM | POA: Diagnosis not present

## 2023-11-03 DIAGNOSIS — Z85038 Personal history of other malignant neoplasm of large intestine: Secondary | ICD-10-CM | POA: Insufficient documentation

## 2023-11-03 DIAGNOSIS — Z85528 Personal history of other malignant neoplasm of kidney: Secondary | ICD-10-CM | POA: Diagnosis not present

## 2023-11-03 DIAGNOSIS — Z8614 Personal history of Methicillin resistant Staphylococcus aureus infection: Secondary | ICD-10-CM | POA: Diagnosis not present

## 2023-11-03 DIAGNOSIS — C241 Malignant neoplasm of ampulla of Vater: Secondary | ICD-10-CM

## 2023-11-03 DIAGNOSIS — Z8572 Personal history of non-Hodgkin lymphomas: Secondary | ICD-10-CM | POA: Diagnosis not present

## 2023-11-03 DIAGNOSIS — Z905 Acquired absence of kidney: Secondary | ICD-10-CM | POA: Diagnosis not present

## 2023-11-03 DIAGNOSIS — C8294 Follicular lymphoma, unspecified, lymph nodes of axilla and upper limb: Secondary | ICD-10-CM | POA: Diagnosis not present

## 2023-11-03 DIAGNOSIS — Z452 Encounter for adjustment and management of vascular access device: Secondary | ICD-10-CM | POA: Diagnosis not present

## 2023-11-03 DIAGNOSIS — Z6835 Body mass index (BMI) 35.0-35.9, adult: Secondary | ICD-10-CM

## 2023-11-03 DIAGNOSIS — D649 Anemia, unspecified: Secondary | ICD-10-CM | POA: Diagnosis not present

## 2023-11-03 HISTORY — PX: AXILLARY SENTINEL NODE BIOPSY: SHX5738

## 2023-11-03 HISTORY — PX: PORTACATH PLACEMENT: SHX2246

## 2023-11-03 SURGERY — INSERTION, TUNNELED CENTRAL VENOUS DEVICE, WITH PORT
Anesthesia: General | Site: Chest | Laterality: Right

## 2023-11-03 MED ORDER — DEXAMETHASONE SODIUM PHOSPHATE 10 MG/ML IJ SOLN
INTRAMUSCULAR | Status: AC
  Filled 2023-11-03: qty 1

## 2023-11-03 MED ORDER — LIDOCAINE 2% (20 MG/ML) 5 ML SYRINGE
INTRAMUSCULAR | Status: DC | PRN
  Administered 2023-11-03: 50 mg via INTRAVENOUS

## 2023-11-03 MED ORDER — CEFAZOLIN SODIUM-DEXTROSE 2-4 GM/100ML-% IV SOLN
INTRAVENOUS | Status: AC
Start: 2023-11-03 — End: 2023-11-03
  Filled 2023-11-03: qty 100

## 2023-11-03 MED ORDER — PROPOFOL 10 MG/ML IV BOLUS
INTRAVENOUS | Status: AC
  Filled 2023-11-03: qty 20

## 2023-11-03 MED ORDER — CHLORHEXIDINE GLUCONATE CLOTH 2 % EX PADS: 6.0000 | MEDICATED_PAD | Freq: Once | CUTANEOUS | Status: DC

## 2023-11-03 MED ORDER — HYDROCODONE-ACETAMINOPHEN 7.5-325 MG PO TABS: 1.0000 | ORAL_TABLET | Freq: Once | ORAL | Status: DC | PRN

## 2023-11-03 MED ORDER — LABETALOL HCL 5 MG/ML IV SOLN
INTRAVENOUS | Status: AC
  Filled 2023-11-03: qty 4

## 2023-11-03 MED ORDER — EPHEDRINE 5 MG/ML INJ
INTRAVENOUS | Status: AC
  Filled 2023-11-03: qty 5

## 2023-11-03 MED ORDER — HEPARIN SOD (PORK) LOCK FLUSH 100 UNIT/ML IV SOLN
INTRAVENOUS | Status: DC | PRN
Start: 2023-11-03 — End: 2023-11-03
  Administered 2023-11-03: 500 [IU] via INTRAVENOUS

## 2023-11-03 MED ORDER — DEXAMETHASONE SODIUM PHOSPHATE 10 MG/ML IJ SOLN
INTRAMUSCULAR | Status: DC | PRN
  Administered 2023-11-03: 5 mg via INTRAVENOUS

## 2023-11-03 MED ORDER — EPHEDRINE SULFATE-NACL 50-0.9 MG/10ML-% IV SOSY
PREFILLED_SYRINGE | INTRAVENOUS | Status: DC | PRN
  Administered 2023-11-03: 5 mg via INTRAVENOUS

## 2023-11-03 MED ORDER — BUPIVACAINE HCL (PF) 0.5 % IJ SOLN
INTRAMUSCULAR | Status: AC
Start: 2023-11-03 — End: 2023-11-03
  Filled 2023-11-03: qty 30

## 2023-11-03 MED ORDER — HEPARIN SOD (PORK) LOCK FLUSH 100 UNIT/ML IV SOLN
INTRAVENOUS | Status: AC
  Filled 2023-11-03: qty 5

## 2023-11-03 MED ORDER — SODIUM CHLORIDE (PF) 0.9 % IJ SOLN
INTRAMUSCULAR | Status: DC | PRN
  Administered 2023-11-03: 500 mL via INTRAVENOUS

## 2023-11-03 MED ORDER — ROCURONIUM BROMIDE 10 MG/ML (PF) SYRINGE
PREFILLED_SYRINGE | INTRAVENOUS | Status: AC
  Filled 2023-11-03: qty 10

## 2023-11-03 MED ORDER — LACTATED RINGERS IV SOLN: INTRAVENOUS | Status: DC

## 2023-11-03 MED ORDER — ONDANSETRON HCL 4 MG/2ML IJ SOLN
INTRAMUSCULAR | Status: DC | PRN
  Administered 2023-11-03 (×2): 4 mg via INTRAVENOUS

## 2023-11-03 MED ORDER — OXYCODONE HCL 5 MG PO TABS: 5.0000 mg | ORAL_TABLET | Freq: Four times a day (QID) | ORAL | 0 refills | Status: DC | PRN

## 2023-11-03 MED ORDER — BUPIVACAINE HCL (PF) 0.5 % IJ SOLN
INTRAMUSCULAR | Status: DC | PRN
  Administered 2023-11-03: 30 mL

## 2023-11-03 MED ORDER — LIDOCAINE HCL (PF) 1 % IJ SOLN
INTRAMUSCULAR | Status: DC | PRN
  Administered 2023-11-03: 6 mL

## 2023-11-03 MED ORDER — FENTANYL CITRATE (PF) 100 MCG/2ML IJ SOLN
INTRAMUSCULAR | Status: DC | PRN
  Administered 2023-11-03 (×4): 25 ug via INTRAVENOUS

## 2023-11-03 MED ORDER — ONDANSETRON HCL 4 MG/2ML IJ SOLN
INTRAMUSCULAR | Status: AC
  Filled 2023-11-03: qty 2

## 2023-11-03 MED ORDER — LIDOCAINE HCL (PF) 1 % IJ SOLN
INTRAMUSCULAR | Status: AC
  Filled 2023-11-03: qty 30

## 2023-11-03 MED ORDER — PHENYLEPHRINE 80 MCG/ML (10ML) SYRINGE FOR IV PUSH (FOR BLOOD PRESSURE SUPPORT)
PREFILLED_SYRINGE | INTRAVENOUS | Status: AC
  Filled 2023-11-03: qty 30

## 2023-11-03 MED ORDER — FENTANYL CITRATE PF 50 MCG/ML IJ SOSY: 25.0000 ug | PREFILLED_SYRINGE | INTRAMUSCULAR | Status: DC | PRN

## 2023-11-03 MED ORDER — CHLORHEXIDINE GLUCONATE 0.12 % MT SOLN: 15.0000 mL | Freq: Once | OROMUCOSAL | Status: AC

## 2023-11-03 MED ORDER — CEFAZOLIN SODIUM-DEXTROSE 2-4 GM/100ML-% IV SOLN
2.0000 g | INTRAVENOUS | Status: AC
  Administered 2023-11-03: 2 g via INTRAVENOUS

## 2023-11-03 MED ORDER — LIDOCAINE 2% (20 MG/ML) 5 ML SYRINGE
INTRAMUSCULAR | Status: AC
  Filled 2023-11-03: qty 5

## 2023-11-03 MED ORDER — CHLORHEXIDINE GLUCONATE 0.12 % MT SOLN
OROMUCOSAL | Status: AC
  Administered 2023-11-03: 15 mL via OROMUCOSAL
  Filled 2023-11-03: qty 15

## 2023-11-03 MED ORDER — FENTANYL CITRATE (PF) 100 MCG/2ML IJ SOLN
INTRAMUSCULAR | Status: AC
  Filled 2023-11-03: qty 2

## 2023-11-03 MED ORDER — PROPOFOL 10 MG/ML IV BOLUS
INTRAVENOUS | Status: DC | PRN
  Administered 2023-11-03: 140 mg via INTRAVENOUS
  Administered 2023-11-03 (×2): 20 mg via INTRAVENOUS

## 2023-11-03 MED ORDER — DEXMEDETOMIDINE HCL IN NACL 80 MCG/20ML IV SOLN
INTRAVENOUS | Status: AC
  Filled 2023-11-03: qty 20

## 2023-11-03 SURGICAL SUPPLY — 34 items
BAG DECANTER FOR FLEXI CONT (MISCELLANEOUS) ×2 IMPLANT
BLADE SURG 15 STRL LF DISP TIS (BLADE) ×2 IMPLANT
CHLORAPREP W/TINT 26 (MISCELLANEOUS) ×2 IMPLANT
CLOTH BEACON ORANGE TIMEOUT ST (SAFETY) ×2 IMPLANT
COVER LIGHT HANDLE (MISCELLANEOUS) IMPLANT
DERMABOND ADVANCED .7 DNX12 (GAUZE/BANDAGES/DRESSINGS) ×2 IMPLANT
DISSECTOR SURG LIGASURE 21 (MISCELLANEOUS) ×2 IMPLANT
DRAPE C-ARM FOLDED MOBILE STRL (DRAPES) ×2 IMPLANT
ELECTRODE REM PT RTRN 9FT ADLT (ELECTROSURGICAL) ×2 IMPLANT
GAUZE SPONGE 4X4 12PLY STRL (GAUZE/BANDAGES/DRESSINGS) ×2 IMPLANT
GLOVE BIOGEL PI IND STRL 7.0 (GLOVE) ×4 IMPLANT
GLOVE SURG SS PI 7.5 STRL IVOR (GLOVE) ×4 IMPLANT
GOWN STRL REUS W/TWL LRG LVL3 (GOWN DISPOSABLE) ×4 IMPLANT
IV NS 500ML BAXH (IV SOLUTION) ×2 IMPLANT
KIT PORT INFUSION SMART 8FR (Port) IMPLANT
KIT TURNOVER KIT A (KITS) ×2 IMPLANT
MANIFOLD NEPTUNE II (INSTRUMENTS) ×2 IMPLANT
NDL HYPO 21X1.5 SAFETY (NEEDLE) ×4 IMPLANT
NDL HYPO 25X1 1.5 SAFETY (NEEDLE) ×2 IMPLANT
NEEDLE HYPO 21X1.5 SAFETY (NEEDLE) ×2 IMPLANT
NEEDLE HYPO 25X1 1.5 SAFETY (NEEDLE) ×2 IMPLANT
PACK MINOR (CUSTOM PROCEDURE TRAY) ×2 IMPLANT
PAD ARMBOARD POSITIONER FOAM (MISCELLANEOUS) ×2 IMPLANT
POSITIONER HEAD 8X9X4 ADT (SOFTGOODS) ×2 IMPLANT
SET BASIN LINEN APH (SET/KITS/TRAYS/PACK) ×2 IMPLANT
SPONGE INTESTINAL PEANUT (DISPOSABLE) IMPLANT
SPONGE T-LAP 18X18 ~~LOC~~+RFID (SPONGE) ×2 IMPLANT
SUT MNCRL AB 4-0 PS2 18 (SUTURE) ×2 IMPLANT
SUT VIC AB 3-0 SH 27X BRD (SUTURE) ×2 IMPLANT
SYR 10ML LL (SYRINGE) IMPLANT
SYR 30ML LL (SYRINGE) ×2 IMPLANT
SYR 5ML LL (SYRINGE) ×2 IMPLANT
SYR BULB IRRIG 60ML STRL (SYRINGE) ×2 IMPLANT
SYR CONTROL 10ML LL (SYRINGE) ×2 IMPLANT

## 2023-11-03 NOTE — Anesthesia Procedure Notes (Signed)
 Procedure Name: LMA Insertion Date/Time: 11/03/2023 7:47 AM  Performed by: Barbarann Verneita RAMAN, CRNAPre-anesthesia Checklist: Patient identified, Patient being monitored, Emergency Drugs available, Timeout performed and Suction available Patient Re-evaluated:Patient Re-evaluated prior to induction Oxygen Delivery Method: Circle System Utilized Preoxygenation: Pre-oxygenation with 100% oxygen Induction Type: IV induction Ventilation: Mask ventilation without difficulty LMA: LMA inserted LMA Size: 3.0 Number of attempts: 1 Placement Confirmation: positive ETCO2 and breath sounds checked- equal and bilateral Tube secured with: Tape (paper)

## 2023-11-03 NOTE — Op Note (Signed)
 Patient:  Nicole [name]  DOB:  11-Oct-1950  MRN:  [id number]   Preop Diagnosis: Ampullary carcinoma  Postop Diagnosis: Same  Procedure: Port-A-Cath insertion, right axillary lymph node biopsy  Surgeon: Oneil Budge, MD  Anes: General  Indications: Patient is a 73 year old white female recently diagnosed with ampullary carcinoma who presents for Port-A-Cath placement as well as a right axillary lymph node biopsy.  The risks and benefits of both procedures including bleeding, infection, pneumothorax were fully explained to the patient, who gave informed consent.  Procedure note: The patient was placed in the supine position after general anesthesia was administered.  The left upper chest and right axilla were prepped and draped using the usual sterile technique with ChloraPrep.  Surgical site confirmation was performed.  I first proceeded with Port-A-Cath insertion.  An incision was made below the left clavicle.  Subcutaneous pocket was formed.  A needle was advanced into the left subclavian vein using the Seldinger technique without difficulty.  A guidewire was then advanced into the right atrium under fluoroscopic guidance.  An introducer and peel-away sheath were placed over the guidewire.  The catheter was then inserted through the peel-away sheath and the peel-away sheath was removed.  The catheter was then attached to the port and the port placed in subcutaneous pocket.  Adequate positioning was confirmed by fluoroscopy.  Good backflow of venous blood was noted on aspiration of the port.  The port was flushed with heparin  flush.  The subcutaneous layer was reapproximated using a 3-0 Vicryl interrupted suture.  The skin was closed using a 4-0 Monocryl subcuticular suture.  Dermabond was applied.  Next, right axillary dissection was performed.  An incision was made into the right axilla.  The dissection was taken down to 2 large lymph nodes.  They were removed using the LigaSure without  difficulty.  They were sent to pathology for further examination.  And bleeding was controlled using the LigaSure.  The subcutaneous layer was reapproximated using a 3-0 Vicryl interrupted suture.  The skin was closed using a 4-0 Monocryl subcuticular suture.  Both incisions were injected with 0.5% Sensorcaine .  Dermabond was applied.  All tape and needle counts were correct at the end of the procedure.  The patient was awakened and transferred to PACU in stable condition.  A chest x-ray will be performed at that time.    Complications: None  EBL: Minimal  Specimen: Right axillary lymph nodes

## 2023-11-03 NOTE — Discharge Instructions (Signed)

## 2023-11-03 NOTE — Transfer of Care (Signed)
 Immediate Anesthesia Transfer of Care Note  Patient: Nicole [name]  Procedure(s) Performed: INSERTION, TUNNELED CENTRAL VENOUS DEVICE, WITH PORT (Left: Chest) BIOPSY, LYMPH NODE, SENTINEL, AXILLARY (Right: Chest)  Patient Location: PACU  Anesthesia Type:General  Level of Consciousness: awake and patient cooperative  Airway & Oxygen Therapy: Patient Spontanous Breathing  Post-op Assessment: Report given to RN and Post -op Vital signs reviewed and stable  Post vital signs: Reviewed and stable  Last Vitals:  Vitals Value Taken Time  BP 152/74 11/03/23 08:41  Temp 98.7 11/03/23  0845  Pulse 81 11/03/23  0845  Resp 22 11/03/23 08:43  SpO2 100 11/03/23  0852  Vitals shown include unfiled device data.  Last Pain:  Vitals:   11/03/23 0636  PainSc: 0-No pain         Complications: No notable events documented.

## 2023-11-03 NOTE — Anesthesia Postprocedure Evaluation (Signed)
 Anesthesia Post Note  Patient: Nicole [name]  Procedure(s) Performed: INSERTION, TUNNELED CENTRAL VENOUS DEVICE, WITH PORT (Left: Chest) BIOPSY, LYMPH NODE, SENTINEL, AXILLARY (Right: Chest)  Patient location during evaluation: PACU Anesthesia Type: General Level of consciousness: awake and alert Pain management: pain level controlled Vital Signs Assessment: post-procedure vital signs reviewed and stable Respiratory status: spontaneous breathing, nonlabored ventilation, respiratory function stable and patient connected to nasal cannula oxygen Cardiovascular status: blood pressure returned to baseline and stable Postop Assessment: no apparent nausea or vomiting Anesthetic complications: no   There were no known notable events for this encounter.   Last Vitals:  Vitals:   11/03/23 0900 11/03/23 0906  BP: (!) 144/87 (!) 179/91  Resp: (!) 7 11  Temp:  36.9 C  SpO2:  98%    Last Pain:  Vitals:   11/03/23 0942  PainSc: 0-No pain                 Orion Mole L Avion Patella

## 2023-11-03 NOTE — Interval H&P Note (Signed)
 History and Physical Interval Note:  11/03/2023 7:11 AM  Nicole [name]  has presented today for surgery, with the diagnosis of AMPULLARY CARCINOMA.  The various methods of treatment have been discussed with the patient and family. After consideration of risks, benefits and other options for treatment, the patient has consented to  Procedure(s): INSERTION, TUNNELED CENTRAL VENOUS DEVICE, WITH PORT (Left) BIOPSY, LYMPH NODE, SENTINEL, AXILLARY (Right) as a surgical intervention.  The patient's history has been reviewed, patient examined, no change in status, stable for surgery.  I have reviewed the patient's chart and labs.  Questions were answered to the patient's satisfaction.     Oneil Budge

## 2023-11-03 NOTE — Anesthesia Preprocedure Evaluation (Addendum)
 Anesthesia Evaluation  Patient identified by MRN, date of birth, ID band Patient awake    Reviewed: Allergy & Precautions, NPO status , Patient's Chart, lab work & pertinent test results  History of Anesthesia Complications Negative for: history of anesthetic complications  Airway Mallampati: II  TM Distance: >3 FB Neck ROM: Full    Dental no notable dental hx. (+) Dental Advisory Given, Teeth Intact   Pulmonary neg pulmonary ROS   Pulmonary exam normal        Cardiovascular negative cardio ROS Normal cardiovascular exam     Neuro/Psych  Neuromuscular disease    GI/Hepatic Common bile duct stones, elevated LFTs. Colon cancer   Endo/Other  negative endocrine ROS  prediabetes  Renal/GU Renal cancer     Musculoskeletal  (+) Arthritis ,    Abdominal   Peds  Hematology  (+) Blood dyscrasia (Hgb 11.6), anemia   Anesthesia Other Findings Lynch syndrome, hx of colon ca s/p colectomy, NHL, renal ca s/p left partial nephrectomy.  Non Hodgkins lymphoma  Reproductive/Obstetrics                              Anesthesia Physical Anesthesia Plan  ASA: 3  Anesthesia Plan: General   Post-op Pain Management: Minimal or no pain anticipated   Induction: Intravenous  PONV Risk Score and Plan: Ondansetron  and Dexamethasone   Airway Management Planned: LMA  Additional Equipment: None  Intra-op Plan:   Post-operative Plan:   Informed Consent: I have reviewed the patients History and Physical, chart, labs and discussed the procedure including the risks, benefits and alternatives for the proposed anesthesia with the patient or authorized representative who has indicated his/her understanding and acceptance.     Dental advisory given  Plan Discussed with: CRNA  Anesthesia Plan Comments:          Anesthesia Quick Evaluation

## 2023-11-04 ENCOUNTER — Inpatient Hospital Stay

## 2023-11-04 ENCOUNTER — Inpatient Hospital Stay: Payer: PPO | Admitting: Oncology

## 2023-11-04 ENCOUNTER — Telehealth: Payer: Self-pay

## 2023-11-04 ENCOUNTER — Encounter (HOSPITAL_COMMUNITY): Payer: Self-pay | Admitting: General Surgery

## 2023-11-04 ENCOUNTER — Other Ambulatory Visit: Payer: Self-pay | Admitting: *Deleted

## 2023-11-04 DIAGNOSIS — C241 Malignant neoplasm of ampulla of Vater: Secondary | ICD-10-CM

## 2023-11-04 MED ORDER — ONDANSETRON HCL 8 MG PO TABS: 8.0000 mg | ORAL_TABLET | Freq: Three times a day (TID) | ORAL | 1 refills | Status: AC | PRN

## 2023-11-04 MED ORDER — LIDOCAINE-PRILOCAINE 2.5-2.5 % EX CREA: TOPICAL_CREAM | CUTANEOUS | 3 refills | Status: AC

## 2023-11-04 MED ORDER — PROCHLORPERAZINE MALEATE 10 MG PO TABS: 10.0000 mg | ORAL_TABLET | Freq: Four times a day (QID) | ORAL | 1 refills | Status: DC | PRN

## 2023-11-04 NOTE — Patient Instructions (Addendum)
 [facility] Chemotherapy Teaching   Dr. Davonna has recommended immunotherapy once every 3 weeks. If you tolerate it well, we can give it every 6 weeks. 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   You will see the doctor regularly throughout treatment.  We will obtain blood work from you prior to every treatment and monitor your results to make sure it is safe to give your treatment. The doctor monitors your response to treatment by the way you are feeling, your blood work, and by obtaining scans periodically.  There will be wait times while you are here for treatment.  It will take about 30 minutes to 1 hour for your lab work to result.  Then there will be wait times while pharmacy mixes your medications.    Pembrolizumab Sigmund)  About This Drug Pembrolizumab is used to treat cancer. It is given in the vein (IV).  This drug will take 30 minutes to infuse.  Possible Side Effects  Tiredness  Fever  Nausea  Decreased appetite (decreased hunger)  Loose bowel movements (diarrhea)  Constipation (not able to move bowels)  Trouble breathing  Rash  Itching  Muscle and bone pain  Cough  Note: Each of the side effects above was reported in 20% or greater of patients treated with pembrolizumab. Not all possible side effects are included above.  Warnings and Precautions   This drug works with your immune system and can cause inflammation in any of your organs and tissues and can change how they work. This may put you at risk for developing serious medical problems which can very rarely be fatal.   Colitis (swelling or inflammation in the colon) - symptoms are loose bowel movements (diarrhea) stomach cramping, and sometimes blood in the stool   Changes in liver function. Your liver function will be checked as needed.   Changes in kidney function, which can very rarely be fatal. Your kidney function will be checked as needed.   Inflammation (swelling) of the lungs which can very  rarely be fatal - you may have a dry cough or trouble breathing.   This drug may affect some of your hormone glands (especially the thyroid , adrenals, pituitary and pancreas). Your hormone levels will be checked as needed.   Blood sugar levels may change and you may develop diabetes. If you already have diabetes, changes may need to be made to your diabetes medication.   Severe allergic skin reaction, which can very rarely be fatal. You may develop blisters on your skin that are filled with fluid or a severe red rash all over your body that may be painful.   Increased risk of organ rejection in patients who have received donor organs   Increased risk of complications in patients who will undergo a stem cell transplant after receiving pembrolizumab.   While you are getting this drug in your vein (IV), you may have a reaction to the drug. Your nurse will check you closely for these signs: fever or shaking chills, flushing, facial swelling, feeling dizzy, headache, trouble breathing, rash, itching, chest tightness, or chest pain. These reactions may occur after your infusion. If this happens, call 911 for emergency care.  Important Information This drug may be present in the saliva, tears, sweat, urine, stool, vomit, semen, and vaginal secretions. Talk to your doctor and/or your nurse about the necessary precautions to take during this time.  Treating Side Effects   Ask your doctor or nurse about medicines that are available to help stop or  lessen constipation, diarrhea and/or nausea.   Drink plenty of fluids (a minimum of eight glasses per day is recommended).   If you are not able to move your bowels, check with your doctor or nurse before you use any enemas, laxatives, or suppositories   To help with nausea and vomiting, eat small, frequent meals instead of three large meals a day. Choose foods and drinks that are at room temperature. Ask your nurse or doctor about other helpful tips and  medicine that is available to help or stop lessen these symptoms.   If you get diarrhea, eat low-fiber foods that are high in protein and calories and avoid foods that can irritate your digestive tracts or lead to cramping. Ask your nurse or doctor about medicine that can lessen or stop your diarrhea.   Manage tiredness by pacing your activities for the day. Be sure to include periods of rest between energy-draining activities   Keeping your pain under control is important to your wellbeing. Please tell your doctor or nurse if you are experiencing pain.   If you have diabetes, keep good control of your blood sugar level. Tell your nurse or your doctor if your glucose levels are higher or lower than normal   If you get a rash do not put anything on it unless your doctor or nurse says you may. Keep the area around the rash clean and dry. Ask your doctor for medicine if your rash bothers you.   Infusion reactions may happen for 24 hours after your infusion. If this happens, call 911 for emergency care.  Food and Drug Interactions   There are no known interactions of pembrolizumab with food.   There are no known interactions of pembrolizumab with other medications.   Tell your doctor and pharmacist about all the medicines and dietary supplements (vitamins, minerals, herbs and others) that you are taking at this time. The safety and use of dietary supplements and alternative agents are often not known. Using these might affect your cancer or interfere with your treatment. Until more is known, you should not use dietary supplements or alternative agents without your cancer doctor's help.   When to Call the Doctor Call your doctor or nurse if you have any of the following symptoms and/or any new or unusual symptoms:   Fever of 100.4 F (38 C) or higher   Chills   Wheezing or trouble breathing   Rash or itching   Feeling dizzy or lightheaded   Loose bowel movements (diarrhea) more than 4  times a day or diarrhea with weakness or lightheadedness, or diarrhea that is not controlled by medications   Nausea that stops you from eating or drinking, and/or that is not relieved by prescribed medicines   Lasting loss of appetite or rapid weight loss of five pounds in a week   Fatigue that interferes with your daily activities   No bowel movement for 3 days or you feel uncomfortable   Extreme weakness that interferes with normal activities   Bad abdominal pain, especially in upper right area   Decreased urine   Unusual thirst or passing urine often   Rash that is not relieved by prescribed medicines   Flu-like symptoms: fever, headache, muscle and joint aches, and fatigue (low energy, feeling weak)   Signs of liver problems: dark urine, pale bowel movements, bad stomach pain, feeling very tired and weak, unusual itching, or yellowing of the eyes or skin   Signs of infusion reactions such as  fever or shaking chills, flushing, facial swelling, feeling dizzy, headache, trouble breathing, rash, itching, chest tightness, or chest pain.   Reproduction Warnings   Pregnancy warning: This drug may have harmful effects on the unborn baby. Women of childbearing potential should use effective methods of birth control during your cancer treatment and for at least 4 months after treatment. Let your doctor know right away if you think you may be pregnant   Breast feeding warning: It is not known if this drug passes into breast milk. It is recommended that women do not breastfeed during treatment and for 4 months after treatment.   Fertility warning: Human fertility studies have not been done with this drug. Talk with your doctor or nurse if you plan to have children.   SELF CARE ACTIVITIES WHILE ON CHEMOTHERAPY/IMMUNOTHERAPY:  Hydration Increase your fluid intake 48 hours prior to treatment and drink at least 8 to 12 cups (64 ounces) of water /decaffeinated beverages per day after  treatment. You can still have your cup of coffee or soda but these beverages do not count as part of your 8 to 12 cups that you need to drink daily. No alcohol intake.  Medications Continue taking your normal prescription medication as prescribed.  If you start any new herbal or new supplements please let us  know first to make sure it is safe.  Mouth Care Have teeth cleaned professionally before starting treatment. Keep dentures and partial plates clean. Use soft toothbrush and do not use mouthwashes that contain alcohol. Biotene is a good mouthwash that is available at most pharmacies or may be ordered by calling [phone]. Use warm salt water  gargles (1 teaspoon salt per 1 quart warm water ) before and after meals and at bedtime. Or you may rinse with 2 tablespoons of three-percent hydrogen peroxide mixed in eight ounces of water . If you are still having problems with your mouth or sores in your mouth please call the clinic. If you need dental work, please let the doctor know before you go for your appointment so that we can coordinate the best possible time for you in regards to your chemo regimen. You need to also let your dentist know that you are actively taking chemo. We may need to do labs prior to your dental appointment.  Skin Care Always use sunscreen that has not expired and with SPF (Sun Protection Factor) of 50 or higher. Wear hats to protect your head from the sun. Remember to use sunscreen on your hands, ears, face, & feet.  Use good moisturizing lotions such as udder cream, eucerin, or even Vaseline. Some chemotherapies can cause dry skin, color changes in your skin and nails.    Avoid long, hot showers or baths. Use gentle, fragrance-free soaps and laundry detergent. Use moisturizers, preferably creams or ointments rather than lotions because the thicker consistency is better at preventing skin dehydration. Apply the cream or ointment within 15 minutes of showering. Reapply  moisturizer at night, and moisturize your hands every time after you wash them.   Infection Prevention Please wash your hands for at least 30 seconds using warm soapy water . Handwashing is the #1 way to prevent the spread of germs. Stay away from sick people or people who are getting over a cold. If you develop respiratory systems such as green/yellow mucus production or productive cough or persistent cough let us  know and we will see if you need an antibiotic. It is a good idea to keep a pair of gloves on when going  into grocery stores/[employer] to decrease your risk of coming into contact with germs on the carts, etc. Carry alcohol hand gel with you at all times and use it frequently if out in public. If your temperature reaches 100.5 or higher please call the clinic and let us  know.  If it is after hours or on the weekend please go to the ER if your temperature is over 100.4.  Please have your own personal thermometer at home to use.    Sex and bodily fluids If you are going to have sex, a condom must be used to protect the person that isn't taking immunotherapy. For a few days after treatment, immunotherapy can be excreted through your bodily fluids.  When using the toilet please close the lid and flush the toilet twice.  Do this for a few day after you have had immunotherapy.   Contraception It is not known for sure whether or not immunotherapy drugs can be passed on through semen or secretions from the vagina. Because of this some doctors advise people to use a barrier method if you have sex during treatment. This applies to vaginal, anal or oral sex.  Generally, doctors advise a barrier method only for the time you are actually having the treatment and for about a week after your treatment.  Advice like this can be worrying, but this does not mean that you have to avoid being intimate with your partner. You can still have close contact with your partner and continue to enjoy sex.  Animals If  you have cats or birds we just ask that you not change the litter or change the cage.  Please have someone else do this for you while you are on immunotherapy.   Food Safety During and After Cancer Treatment Food safety is important for people both during and after cancer treatment. Cancer and cancer treatments, such as chemotherapy, radiation therapy, and stem cell/bone marrow transplantation, often weaken the immune system. This makes it harder for your body to protect itself from foodborne illness, also called food poisoning. Foodborne illness is caused by eating food that contains harmful bacteria, parasites, or viruses.  Foods to avoid Some foods have a higher risk of becoming tainted with bacteria. These include: Unwashed fresh fruit and vegetables, especially leafy vegetables that can hide dirt and other contaminants Raw sprouts, such as alfalfa sprouts Raw or undercooked beef, especially ground beef, or other raw or undercooked meat and poultry Fatty, fried, or spicy foods immediately before or after treatment.  These can sit heavy on your stomach and make you feel nauseous. Raw or undercooked shellfish, such as oysters. Sushi and sashimi, which often contain raw fish.  Unpasteurized beverages, such as unpasteurized fruit juices, raw milk, raw yogurt, or cider Undercooked eggs, such as soft boiled, over easy, and poached; raw, unpasteurized eggs; or foods made with raw egg, such as homemade raw cookie dough and homemade mayonnaise  Simple steps for food safety  Shop smart. Do not buy food stored or displayed in an unclean area. Do not buy bruised or damaged fruits or vegetables. Do not buy cans that have cracks, dents, or bulges. Pick up foods that can spoil at the end of your shopping trip and store them in a cooler on the way home.  Prepare and clean up foods carefully. Rinse all fresh fruits and vegetables under running water , and dry them with a clean towel or paper towel. Clean  the top of cans before opening them. After preparing food,  wash your hands for 20 seconds with hot water  and soap. Pay special attention to areas between fingers and under nails. Clean your utensils and dishes with hot water  and soap. Disinfect your kitchen and cutting boards using 1 teaspoon of liquid, unscented bleach mixed into 1 quart of water .    Dispose of old food. Eat canned and packaged food before its expiration date (the "use by" or "best before" date). Consume refrigerated leftovers within 3 to 4 days. After that time, throw out the food. Even if the food does not smell or look spoiled, it still may be unsafe. Some bacteria, such as Listeria, can grow even on foods stored in the refrigerator if they are kept for too long.  Take precautions when eating out. At restaurants, avoid buffets and salad bars where food sits out for a long time and comes in contact with many people. Food can become contaminated when someone with a virus, often a norovirus, or another "bug" handles it. Put any leftover food in a "to-go" container yourself, rather than having the server do it. And, refrigerate leftovers as soon as you get home. Choose restaurants that are clean and that are willing to prepare your food as you order it cooked.    SYMPTOMS TO REPORT AS SOON AS POSSIBLE AFTER TREATMENT:  FEVER GREATER THAN 100.4 F  CHILLS WITH OR WITHOUT FEVER  NAUSEA AND VOMITING THAT IS NOT CONTROLLED WITH YOUR NAUSEA MEDICATION  UNUSUAL SHORTNESS OF BREATH  UNUSUAL BRUISING OR BLEEDING  TENDERNESS IN MOUTH AND THROAT WITH OR WITHOUT PRESENCE OF ULCERS  URINARY PROBLEMS  BOWEL PROBLEMS  UNUSUAL RASH     Wear comfortable clothing and clothing appropriate for easy access to any Portacath or PICC line. Let us  know if there is anything that we can do to make your therapy better!   What to do if you need assistance after hours or on the weekends: CALL [phone].  HOLD on the line, do not hang  up.  You will hear multiple messages but at the end you will be connected with a nurse triage line.  They will contact the doctor if necessary.  Most of the time they will be able to assist you.  Do not call the hospital operator.    I have been informed and understand all of the instructions given to me and have received a copy. I have been instructed to call the clinic [phone] or my family physician as soon as possible for continued medical care, if indicated. I do not have any more questions at this time but understand that I may call the Cancer Center or the Patient Navigator at [phone] during office hours should I have questions or need assistance in obtaining follow-up care.

## 2023-11-04 NOTE — Telephone Encounter (Signed)
 Pt called to make MD Dahlstedt aware that her CT was in and she stated due to her Immunol therapy she may need to cancel her upcoming appointment on 08/26 pt was advised a message will be sent to the provider for his advisement pt voiced her understanding

## 2023-11-04 NOTE — Progress Notes (Signed)

## 2023-11-05 ENCOUNTER — Other Ambulatory Visit: Payer: Self-pay

## 2023-11-05 ENCOUNTER — Ambulatory Visit: Admitting: Licensed Clinical Social Worker

## 2023-11-05 DIAGNOSIS — C241 Malignant neoplasm of ampulla of Vater: Secondary | ICD-10-CM

## 2023-11-05 NOTE — Progress Notes (Signed)
 [facility] Clinical Social Work  Initial Assessment   Nicole [name] is a 73 y.o. year old female contacted by phone. Clinical Social Work was referred by medical provider for assessment of psychosocial needs.   SDOH (Social Determinants of Health) assessments performed: Yes SDOH Interventions    Flowsheet Row Clinical Support from 07/30/2023 in [facility] Visit from 04/06/2020 in [facility]  SDOH Interventions    Food Insecurity Interventions Intervention Not Indicated --  Housing Interventions Intervention Not Indicated Intervention Not Indicated  Transportation Interventions Intervention Not Indicated --  Utilities Interventions Intervention Not Indicated --  Alcohol Usage Interventions Intervention Not Indicated (Score <7) --  Financial Strain Interventions Intervention Not Indicated --  Physical Activity Interventions Intervention Not Indicated --  Stress Interventions Intervention Not Indicated --  Social Connections Interventions Intervention Not Indicated --  Health Literacy Interventions Intervention Not Indicated --    SDOH Screenings   Food Insecurity: No Food Insecurity (10/02/2023)  Housing: Low Risk  (10/02/2023)  Transportation Needs: No Transportation Needs (10/02/2023)  Utilities: Not At Risk (10/02/2023)  Alcohol Screen: Low Risk  (07/30/2023)  Depression (PHQ2-9): Low Risk  (10/30/2023)  Financial Resource Strain: Low Risk  (07/30/2023)  Physical Activity: Insufficiently Active (07/27/2023)  Social Connections: Moderately Integrated (10/02/2023)  Stress: No Stress Concern Present (07/30/2023)  Tobacco Use: Low Risk  (11/03/2023)  Health Literacy: Adequate Health Literacy (07/30/2023)    PHQ 2/9:    10/30/2023    1:03 PM 07/30/2023    3:35 PM 02/18/2023   10:07 AM  Depression screen PHQ 2/9  Decreased Interest 0 0 0  Down, Depressed, Hopeless 0 0 0  PHQ - 2 Score 0 0 0  Altered sleeping   0  Tired,  decreased energy   1  Change in appetite   1  Feeling bad or failure about yourself    0  Trouble concentrating   0  Moving slowly or fidgety/restless   0  Suicidal thoughts   0  PHQ-9 Score   2  Difficult doing work/chores   Not difficult at all     Distress Screen completed: Yes    11/04/2023   11:11 AM  ONCBCN DISTRESS SCREENING  Screening Type Initial Screening  How much distress have you been experiencing in the past week? (0-10) 2  Physical Concerns Type  Fatigue      Family/Social Information:  Housing Arrangement: patient lives with her granddaughter. Family members/support persons in your life? Pt reports her granddaughter is the only family she has which is local, but does have neighbors and friends who are able to assist if needed. Transportation concerns: no  Employment: Retired Artist.  Income source: Actor concerns: No Type of concern: None Food access concerns: no Religious or spiritual practice: Yes-[employer] Advanced directives: Not known Services Currently in place:  none  Coping/ Adjustment to diagnosis: Patient understands treatment plan and what happens next? yes Concerns about diagnosis and/or treatment: Quality of life Patient reported stressors: Adjusting to my illness Hopes and/or priorities: pt's priority is to start treatment w/ the hope of positive results Patient enjoys time with family/ friends Current coping skills/ strengths: Capable of independent living , Motivation for treatment/growth , Physical Health , and Supportive family/friends     SUMMARY: Current SDOH Barriers:  No barriers identified at this time.  Clinical Social Work Clinical Goal(s):  No clinical social work goals at this time  Interventions:  Discussed common feeling and emotions when being diagnosed with cancer, and the importance of support during treatment Informed patient of the support team roles and support services at  [facility] Provided CSW contact information and encouraged patient to call with any questions or concerns Referred patient to community resources: [facility]    Follow Up Plan: Patient will contact CSW with any support or resource needs Patient verbalizes understanding of plan: Yes    Devere JONELLE Manna, LCSW Clinical Social Worker [facility]

## 2023-11-10 ENCOUNTER — Inpatient Hospital Stay

## 2023-11-10 ENCOUNTER — Other Ambulatory Visit: Payer: Self-pay | Admitting: Oncology

## 2023-11-10 ENCOUNTER — Inpatient Hospital Stay: Admitting: Dietician

## 2023-11-10 VITALS — BP 135/79 | HR 68 | Temp 98.4°F | Resp 18 | Wt 184.0 lb

## 2023-11-10 DIAGNOSIS — C241 Malignant neoplasm of ampulla of Vater: Secondary | ICD-10-CM

## 2023-11-10 DIAGNOSIS — Z5112 Encounter for antineoplastic immunotherapy: Secondary | ICD-10-CM | POA: Diagnosis not present

## 2023-11-10 LAB — CBC WITH DIFFERENTIAL/PLATELET
Abs Immature Granulocytes: 0.03 K/uL (ref 0.00–0.07)
Basophils Absolute: 0.1 K/uL (ref 0.0–0.1)
Basophils Relative: 1 %
Eosinophils Absolute: 0.4 K/uL (ref 0.0–0.5)
Eosinophils Relative: 5 %
HCT: 38.6 % (ref 36.0–46.0)
Hemoglobin: 12.2 g/dL (ref 12.0–15.0)
Immature Granulocytes: 0 %
Lymphocytes Relative: 14 %
Lymphs Abs: 1 K/uL (ref 0.7–4.0)
MCH: 28.9 pg (ref 26.0–34.0)
MCHC: 31.6 g/dL (ref 30.0–36.0)
MCV: 91.5 fL (ref 80.0–100.0)
Monocytes Absolute: 0.6 K/uL (ref 0.1–1.0)
Monocytes Relative: 9 %
Neutro Abs: 5 K/uL (ref 1.7–7.7)
Neutrophils Relative %: 71 %
Platelets: 147 K/uL — ABNORMAL LOW (ref 150–400)
RBC: 4.22 MIL/uL (ref 3.87–5.11)
RDW: 15 % (ref 11.5–15.5)
WBC: 7.1 K/uL (ref 4.0–10.5)
nRBC: 0 % (ref 0.0–0.2)

## 2023-11-10 LAB — COMPREHENSIVE METABOLIC PANEL WITH GFR
ALT: 18 U/L (ref 0–44)
AST: 22 U/L (ref 15–41)
Albumin: 3.2 g/dL — ABNORMAL LOW (ref 3.5–5.0)
Alkaline Phosphatase: 91 U/L (ref 38–126)
Anion gap: 10 (ref 5–15)
BUN: 16 mg/dL (ref 8–23)
CO2: 22 mmol/L (ref 22–32)
Calcium: 8.6 mg/dL — ABNORMAL LOW (ref 8.9–10.3)
Chloride: 105 mmol/L (ref 98–111)
Creatinine, Ser: 0.8 mg/dL (ref 0.44–1.00)
GFR, Estimated: >60 mL/min (ref >60)
Glucose, Bld: 120 mg/dL — ABNORMAL HIGH (ref 70–99)
Potassium: 3.8 mmol/L (ref 3.5–5.1)
Sodium: 137 mmol/L (ref 135–145)
Total Bilirubin: 0.7 mg/dL (ref 0.0–1.2)
Total Protein: 6.1 g/dL — ABNORMAL LOW (ref 6.5–8.1)

## 2023-11-10 LAB — TSH: TSH: 0.838 u[IU]/mL (ref 0.350–4.500)

## 2023-11-10 MED ORDER — SODIUM CHLORIDE 0.9 % IV SOLN
INTRAVENOUS | Status: DC
Start: 2023-11-10 — End: 2023-11-10

## 2023-11-10 MED ORDER — ALTEPLASE 2 MG IJ SOLR
2.0000 mg | Freq: Once | INTRAMUSCULAR | Status: AC | PRN
  Administered 2023-11-10: 2 mg
  Filled 2023-11-10: qty 2

## 2023-11-10 MED ORDER — SODIUM CHLORIDE 0.9 % IV SOLN
200.0000 mg | Freq: Once | INTRAVENOUS | Status: AC
  Administered 2023-11-10: 200 mg via INTRAVENOUS
  Filled 2023-11-10: qty 8

## 2023-11-10 NOTE — Progress Notes (Signed)
 Patient presents today for C1D1 Keytruda . Vital signs and lab work are within parameters for treatment. [employer] Keytruda  information printed out and given tp patient.   Treatment given today per MD orders. Tolerated infusion without adverse affects. Vital signs stable. No complaints at this time. Discharged from clinic ambulatory in stable condition. Alert and oriented x 3. F/U with [facility] as scheduled.

## 2023-11-10 NOTE — Progress Notes (Signed)
 Pharmacist Chemotherapy Monitoring - Initial Assessment    Anticipated start date: 11/10/23   The following has been reviewed per standard work regarding the patient's treatment regimen: The patient's diagnosis, treatment plan and drug doses, and organ/hematologic function Lab orders and baseline tests specific to treatment regimen  The treatment plan start date, drug sequencing, and pre-medications Prior authorization status  Patient's documented medication list, including drug-drug interaction screen and prescriptions for anti-emetics and supportive care specific to the treatment regimen The drug concentrations, fluid compatibility, administration routes, and timing of the medications to be used The patient's access for treatment and lifetime cumulative dose history, if applicable  The patient's medication allergies and previous infusion related reactions, if applicable   Changes made to treatment plan:  N/A  Follow up needed:  N/A   Nicole [name] Molt, [facility], 11/10/2023  9:24 AM

## 2023-11-10 NOTE — Progress Notes (Signed)
 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 She is receiving palliative immunotherapy with keytruda  q21d. Patient is under the care of Dr. Davonna  Past medical history significant of colon cancer s/p hemicolectomy (2007), RCC s/p partial nephrectomy with recurrence s/p ablation, multiple skin cancers s/p Mohs, follicular lymphoma, B12 deficiency, HTN, anemia in neoplastic disease, HLD  8/1-8/6 - [facility] admission   Met with patient in infusion. She is eating a pack of nabs at visit. Patient reports eating half banana prior to appointment. She endorses good appetite at baseline. Eats 3 meals most days. Yesterday had egg/cheese/canadian bacon sandwich for breakfast, 2 pieces veggie pizza for lunch, tuna fish and sliced tomato for dinner. Patient drinks Ensure Max occasionally. She denies nausea, vomiting, constipation, diarrhea. Has 2-3 soft bowel movements daily s/p colectomy.   Nutrition Focused Physical Exam: well nourished, no depletions appreciated     Medications: zofran , oxycodone , compazine    Labs: glucose 120, albumin 3.2   Anthropometrics:   Height: 4' 11.5 Weight: 184 lb  UBW: 189 lb 12.8 oz (7/16) BMI: 36.54   NUTRITION DIAGNOSIS: Food and nutrition related knowledge deficit related to cancer as evidenced by no prior need for associated nutrition information     INTERVENTION:  Discussed importance of adequate calorie/protein energy intake to maintain strength/weights  Encourage 4-5 small meals vs 3 larger meals  Discussed sources of protein, recommend protein food at every meal  Continue Ensure Max/equivalent as needed - samples + coupons provied Contact information provided    MONITORING, EVALUATION, GOAL: Patient will tolerate adequate calories and protein for wt maintenance   Next Visit: To be scheduled as needed - pt encouraged to contact with  nutrition questions/concerns

## 2023-11-10 NOTE — Telephone Encounter (Signed)
 Called pt to give MD Dahlstedt recommendations pt stated she would come in tomorrow

## 2023-11-10 NOTE — Patient Instructions (Signed)
 [facility] [place] - A DEPT OF [facility]  Discharge Instructions: Thank you for choosing  Cancer Center to provide your oncology and hematology care.  If you have a lab appointment with the Cancer Center - please note that after April 8th, 2024, all labs will be drawn in the cancer center.  You do not have to check in or register with the main entrance as you have in the past but will complete your check-in in the cancer center.  Wear comfortable clothing and clothing appropriate for easy access to any Portacath or PICC line.   We strive to give you quality time with your provider. You may need to reschedule your appointment if you arrive late (15 or more minutes).  Arriving late affects you and other patients whose appointments are after yours.  Also, if you miss three or more appointments without notifying the office, you may be dismissed from the clinic at the provider's discretion.      For prescription refill requests, have your pharmacy contact our office and allow 72 hours for refills to be completed.    Today you received the following chemotherapy and/or immunotherapy agents Keytruda. Pembrolizumab Injection What is this medication? PEMBROLIZUMAB (PEM broe LIZ ue mab) treats some types of cancer. It works by helping your immune system slow or stop the spread of cancer cells. It is a monoclonal antibody. This medicine may be used for other purposes; ask your health care provider or pharmacist if you have questions. COMMON BRAND NAME(S): Keytruda What should I tell my care team before I take this medication? They need to know if you have any of these conditions: Allogeneic stem cell transplant (uses someone else's stem cells) Autoimmune diseases, such as Crohn disease, ulcerative colitis, lupus History of chest radiation Nervous system problems, such as Guillain-Barre syndrome, myasthenia gravis Organ transplant An unusual or allergic reaction to  pembrolizumab, other medications, foods, dyes, or preservatives Pregnant or trying to get pregnant Breast-feeding How should I use this medication? This medication is injected into a vein. It is given by your care team in a hospital or clinic setting. A special MedGuide will be given to you before each treatment. Be sure to read this information carefully each time. Talk to your care team about the use of this medication in children. While it may be prescribed for children as young as 6 months for selected conditions, precautions do apply. Overdosage: If you think you have taken too much of this medicine contact a poison control center or emergency room at once. NOTE: This medicine is only for you. Do not share this medicine with others. What if I miss a dose? Keep appointments for follow-up doses. It is important not to miss your dose. Call your care team if you are unable to keep an appointment. What may interact with this medication? Interactions have not been studied. This list may not describe all possible interactions. Give your health care provider a list of all the medicines, herbs, non-prescription drugs, or dietary supplements you use. Also tell them if you smoke, drink alcohol, or use illegal drugs. Some items may interact with your medicine. What should I watch for while using this medication? Your condition will be monitored carefully while you are receiving this medication. You may need blood work while taking this medication. This medication may cause serious skin reactions. They can happen weeks to months after starting the medication. Contact your care team right away if you notice  fevers or flu-like symptoms with a rash. The rash may be red or purple and then turn into blisters or peeling of the skin. You may also notice a red rash with swelling of the face, lips, or lymph nodes in your neck or under your arms. Tell your care team right away if you have any change in your  eyesight. Talk to your care team if you may be pregnant. Serious birth defects can occur if you take this medication during pregnancy and for 4 months after the last dose. You will need a negative pregnancy test before starting this medication. Contraception is recommended while taking this medication and for 4 months after the last dose. Your care team can help you find the option that works for you. Do not breastfeed while taking this medication and for 4 months after the last dose. What side effects may I notice from receiving this medication? Side effects that you should report to your care team as soon as possible: Allergic reactions--skin rash, itching, hives, swelling of the face, lips, tongue, or throat Dry cough, shortness of breath or trouble breathing Eye pain, redness, irritation, or discharge with blurry or decreased vision Heart muscle inflammation--unusual weakness or fatigue, shortness of breath, chest pain, fast or irregular heartbeat, dizziness, swelling of the ankles, feet, or hands Hormone gland problems--headache, sensitivity to light, unusual weakness or fatigue, dizziness, fast or irregular heartbeat, increased sensitivity to cold or heat, excessive sweating, constipation, hair loss, increased thirst or amount of urine, tremors or shaking, irritability Infusion reactions--chest pain, shortness of breath or trouble breathing, feeling faint or lightheaded Kidney injury (glomerulonephritis)--decrease in the amount of urine, red or dark brown urine, foamy or bubbly urine, swelling of the ankles, hands, or feet Liver injury--right upper belly pain, loss of appetite, nausea, light-colored stool, dark yellow or brown urine, yellowing skin or eyes, unusual weakness or fatigue Pain, tingling, or numbness in the hands or feet, muscle weakness, change in vision, confusion or trouble speaking, loss of balance or coordination, trouble walking, seizures Rash, fever, and swollen lymph  nodes Redness, blistering, peeling, or loosening of the skin, including inside the mouth Sudden or severe stomach pain, bloody diarrhea, fever, nausea, vomiting Side effects that usually do not require medical attention (report to your care team if they continue or are bothersome): Bone, joint, or muscle pain Diarrhea Fatigue Loss of appetite Nausea Skin rash This list may not describe all possible side effects. Call your doctor for medical advice about side effects. You may report side effects to FDA at 1-800-FDA-1088. Where should I keep my medication? This medication is given in a hospital or clinic. It will not be stored at home. NOTE: This sheet is a summary. It may not cover all possible information. If you have questions about this medicine, talk to your doctor, pharmacist, or health care provider.  2024 Elsevier/Gold Standard (2021-07-17 00:00:00)       To help prevent nausea and vomiting after your treatment, we encourage you to take your nausea medication as directed.  BELOW ARE SYMPTOMS THAT SHOULD BE REPORTED IMMEDIATELY: *FEVER GREATER THAN 100.4 F (38 C) OR HIGHER *CHILLS OR SWEATING *NAUSEA AND VOMITING THAT IS NOT CONTROLLED WITH YOUR NAUSEA MEDICATION *UNUSUAL SHORTNESS OF BREATH *UNUSUAL BRUISING OR BLEEDING *URINARY PROBLEMS (pain or burning when urinating, or frequent urination) *BOWEL PROBLEMS (unusual diarrhea, constipation, pain near the anus) TENDERNESS IN MOUTH AND THROAT WITH OR WITHOUT PRESENCE OF ULCERS (sore throat, sores in mouth, or a toothache) UNUSUAL RASH, SWELLING  OR PAIN  UNUSUAL VAGINAL DISCHARGE OR ITCHING   Items with * indicate a potential emergency and should be followed up as soon as possible or go to the Emergency Department if any problems should occur.  Please show the CHEMOTHERAPY ALERT CARD or IMMUNOTHERAPY ALERT CARD at check-in to the Emergency Department and triage nurse.  Should you have questions after your visit or need to  cancel or reschedule your appointment, please contact [facility] [place] - A DEPT OF [facility] [phone]  and follow the prompts.  Office hours are 8:00 a.m. to 4:30 p.m. Monday - Friday. Please note that voicemails left after 4:00 p.m. may not be returned until the following business day.  We are closed weekends and major holidays. You have access to a nurse at all times for urgent questions. Please call the main number to the clinic [phone] and follow the prompts.  For any non-urgent questions, you may also contact your provider using MyChart. We now offer e-Visits for anyone 42 and older to request care online for non-urgent symptoms. For details visit [email or web address].   Also download the MyChart app! Go to the app store, search "MyChart", open the app, select [facility], and log in with your MyChart username and password.

## 2023-11-10 NOTE — Progress Notes (Signed)
 Patients port flushed without difficulty.  No blood return noted with no bruising or swelling noted at site.  Port flushes with ease, no pain, discomfort, swelling, or redness. TPA administered at 0859. MD and treatment team made aware.       Sarahlynn Cisnero

## 2023-11-11 ENCOUNTER — Encounter: Payer: Self-pay | Admitting: Urology

## 2023-11-11 ENCOUNTER — Ambulatory Visit: Payer: HMO | Admitting: Urology

## 2023-11-11 VITALS — BP 117/75 | HR 80

## 2023-11-11 DIAGNOSIS — Z85528 Personal history of other malignant neoplasm of kidney: Secondary | ICD-10-CM | POA: Diagnosis not present

## 2023-11-11 DIAGNOSIS — N209 Urinary calculus, unspecified: Secondary | ICD-10-CM

## 2023-11-11 DIAGNOSIS — C241 Malignant neoplasm of ampulla of Vater: Secondary | ICD-10-CM | POA: Diagnosis not present

## 2023-11-11 DIAGNOSIS — N390 Urinary tract infection, site not specified: Secondary | ICD-10-CM

## 2023-11-11 DIAGNOSIS — C784 Secondary malignant neoplasm of small intestine: Secondary | ICD-10-CM | POA: Diagnosis not present

## 2023-11-11 LAB — URINALYSIS, ROUTINE W REFLEX MICROSCOPIC
Bilirubin, UA: NEGATIVE
Glucose, UA: NEGATIVE
Ketones, UA: NEGATIVE
Leukocytes,UA: NEGATIVE
Nitrite, UA: NEGATIVE
Protein,UA: NEGATIVE
Specific Gravity, UA: 1.015 (ref 1.005–1.030)
Urobilinogen, Ur: 0.2 mg/dL (ref 0.2–1.0)
pH, UA: 6 (ref 5.0–7.5)

## 2023-11-11 LAB — MICROSCOPIC EXAMINATION: Bacteria, UA: NONE SEEN

## 2023-11-11 LAB — T4: T4, Total: 7.5 ug/dL (ref 4.5–12.0)

## 2023-11-11 NOTE — Progress Notes (Signed)
 Impression/Assessment:  Recurrent left urolithiasis, stable stone burden at present, on potassium citrate   History of renal cell carcinoma of the left kidney, status post partial nephrectomy and cryoablation, no evidence of recurrence at last imaging  New diagnosis of ampullary carcinoma of pancreas, on targeted therapy  Plan:  I will have her come back in 6 months.  Continue potassium citrate    History of Present Illness: Nicole [name] has a history of renal cell carcinoma of the left kidney as well as recurrent left urolithiasis.  Renal cell carcinoma: She is status post left partial nephrectomy 1.31.2007 for renal cell carcinoma. Final pathology revealed stage T1a clear cell carcinoma, Fuhrman nuclear grade 3/4. Tumor size was 1.9 cm.   CT A/P 1/18 revealed no evidence of recurrence.   1.6.2020: CT scan revealed enhancing 19 mm left upper pole renal mass consistent with recurrent renal cell carcinoma. There is stable retroperitoneal/peri aortic adenopathy. This was treated w/ cryoablation on 3.4.2020.   6.28.2022: She is followed by Dr. Rogers.  She saw him in January of this year and got a clean bill of health.  She does have Lynch syndrome.    Left urolithiasis:  She has had multiple procedures performed for recurring stones on the left.  Initially she underwent left percutaneous nephrolithotomy in March 2016.  She has had ureteroscopic procedures performed in April 2016, September 2022 and April 2024.  It is felt that the recurrent stone disease on the left has been related to her prior oncologic interventions on that side.  8.13.2024: Here today for routine check.  She has had no real stone related issues since her visit here 3 months ago.  She is on potassium citrate  3 times a day.  2.18.2025: Here for routine check.  Still on potassium citrate .  Also takes magnesium .  She has had no gross hematuria or dysuria.  8.26.2025: Here for routine check.  She was recently diagnosed with  ampullary carcinoma of the pancreas.  She has a stent in place.  She apparently is not going to be having a Whipple procedure.  She is on Keytruda .  She continues her potassium citrate . Past Medical History:  Diagnosis Date   Allergy ?   Anemia    Arthritis    shorulder   B12 deficiency    Cancer (HCC) 2001   Chronic diarrhea SECONDARY HX COLON CANCER   Colon cancer (HCC)    History of colon cancer [facility] SYNDROME ---  COLORECTAL CANCER S/P COLECTOMY X3  LAST ONE 2007   NO RECURRENCE   History of colon cancer 05/04/2013   History of kidney infection 08/2013   History of kidney stones    History of MRSA infection 08/2013   left leg   Hx of osteopenia    Hyperlipidemia ?   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   NHL (non-Hodgkin's lymphoma) (HCC) 10/17/2010   MONITORED BY DR PERNELL   Personal history of renal cancer S/P LEFT PARTIAL NEPHRECTOMY 2007--  NO RECURRENCE   DUE TO KIDNEY CANCER   Right shoulder pain S/P ROTATOR CUFF REPAIR   Skin cancer     Past Surgical History:  Procedure Laterality Date   ABDOMINAL HYSTERECTOMY     APPENDECTOMY  1993   W/ LAVH   AXILLARY SENTINEL NODE BIOPSY Right 11/03/2023   Procedure: BIOPSY, LYMPH NODE, SENTINEL, AXILLARY;  Surgeon: Mavis Anes, MD;  Location: [facility];  Service: General;  Laterality: Right;   COLON SURGERY  [phone]   COLONOSCOPY W/ POLYPECTOMY     CYSTOSCOPY W/ RETROGRADES  07/14/2014   Procedure: CYSTOSCOPY WITH RETROGRADE PYELOGRAM;  Surgeon: Garnette Shack, MD;  Location: [facility];  Service: Urology;;   CYSTOSCOPY W/ URETERAL STENT REMOVAL Left 07/14/2014   Procedure: CYSTOSCOPY WITH STENT REMOVAL;  Surgeon: Garnette Shack, MD;  Location: [facility];  Service: Urology;  Laterality: Left;   CYSTOSCOPY WITH RETROGRADE PYELOGRAM, URETEROSCOPY AND STENT PLACEMENT Left 11/30/2020   Procedure: CYSTOSCOPY WITH RETROGRADE PYELOGRAM,  URETEROSCOPY/STONE EXTRACTION AND STENT PLACEMENT;  Surgeon: Shack Garnette, MD;  Location: [facility];  Service: Urology;  Laterality: Left;   CYSTOSCOPY WITH RETROGRADE PYELOGRAM, URETEROSCOPY AND STENT PLACEMENT Left 06/24/2022   Procedure: CYSTOSCOPY WITH RETROGRADE PYELOGRAM, URETEROSCOPY AND STENT PLACEMENT;  Surgeon: Shack Garnette, MD;  Location: [facility];  Service: Urology;  Laterality: Left;   CYSTOSCOPY WITH STENT PLACEMENT  02/07/2012   Procedure: CYSTOSCOPY WITH STENT PLACEMENT;  Surgeon: Garnette Shack, MD;  [facility];  Service: Urology;  Laterality: Left;   CYSTOSCOPY WITH STENT PLACEMENT Left 07/14/2014   Procedure: CYSTOSCOPY WITH STENT PLACEMENT;  Surgeon: Garnette Shack, MD;  Location: [facility];  Service: Urology;  Laterality: Left;   ESOPHAGOGASTRODUODENOSCOPY N/A 10/03/2023   Procedure: EGD (ESOPHAGOGASTRODUODENOSCOPY);  Surgeon: Avram Lupita BRAVO, MD;  Location: [facility];  Service: Gastroenterology;  Laterality: N/A;  ercp scheduled, but unable to be done due to large ulcer   EYE SURGERY  ?   Removal of styes   FLEXIBLE SIGMOIDOSCOPY  08/29/2011   Procedure: FLEXIBLE SIGMOIDOSCOPY;  Surgeon: Claudis RAYMOND Rivet, MD;  [facility];  Service: Endoscopy;  Laterality: N/A;  930   FLEXIBLE SIGMOIDOSCOPY N/A 09/28/2012   Procedure: FLEXIBLE SIGMOIDOSCOPY;  Surgeon: Claudis RAYMOND Rivet, MD;  [facility];  Service: Endoscopy;  Laterality: N/A;  730   FLEXIBLE SIGMOIDOSCOPY N/A 07/08/2014   Procedure: FLEXIBLE SIGMOIDOSCOPY;  Surgeon: Claudis RAYMOND Rivet, MD;  [facility];  Service: Endoscopy;  Laterality: N/A;  830 - moved to 10:20 - Ann to notify pt   FLEXIBLE SIGMOIDOSCOPY N/A 08/11/2015   Procedure: FLEXIBLE SIGMOIDOSCOPY;  Surgeon: Claudis RAYMOND Rivet, MD;  [facility];  Service: Endoscopy;  Laterality: N/A;  730   FLEXIBLE SIGMOIDOSCOPY N/A 08/13/2016    Procedure: FLEXIBLE SIGMOIDOSCOPY;  Surgeon: Rivet Claudis RAYMOND, MD;  [facility];  Service: Endoscopy;  Laterality: N/A;  730   FLEXIBLE SIGMOIDOSCOPY N/A 10/06/2019   Procedure: FLEXIBLE SIGMOIDOSCOPY;  Surgeon: Rivet Claudis RAYMOND, MD;  [facility];  Service: Endoscopy;  Laterality: N/A;  1245   HEMICOLECTOMY  2001   right   HEMICOLECTOMY  2002   left   HOLMIUM LASER APPLICATION Left 07/14/2014   Procedure: HOLMIUM LASER LITHOTRIPSY,;  Surgeon: Garnette Shack, MD;  Location: [facility];  Service: Urology;  Laterality: Left;   HOLMIUM LASER APPLICATION Left 11/30/2020   Procedure: HOLMIUM LASER APPLICATION;  Surgeon: Shack Garnette, MD;  Location: [facility];  Service: Urology;  Laterality: Left;   HOLMIUM LASER APPLICATION Left 06/24/2022   Procedure: HOLMIUM LASER APPLICATION;  Surgeon: Shack Garnette, MD;  Location: [facility];  Service: Urology;  Laterality: Left;   IR RADIOLOGIST EVAL & MGMT  04/29/2018   IR RADIOLOGIST EVAL & MGMT  10/07/2019   IR RADIOLOGIST EVAL & MGMT  10/11/2020   IR RADIOLOGIST EVAL & MGMT  11/08/2021   kidney resection  2007   left partial   LAPAROSCOPIC ASSISTED VAGINAL HYSTERECTOMY  1993   W/ BILATERAL SALPINGO-OOPHORECTOMY   LEFT FLANK EXPLORATION W/  PARTIAL LEFT NEPHRECTOMY/ LEFT HILAR LYMPH NODE BX  04-17-2005  DR Jorryn Casagrande   RENAL CELL CARCINOMA   LITHOTRIPSY  11-22013   x2   NEPHROLITHOTOMY Left 06/09/2014   Procedure: NEPHROLITHOTOMY PERCUTANEOUS;  Surgeon: Garnette Shack, MD;  Location: [facility];  Service: Urology;  Laterality: Left;  with STENT   PORTACATH PLACEMENT Left 11/03/2023   Procedure: INSERTION, TUNNELED CENTRAL VENOUS DEVICE, WITH PORT;  Surgeon: Mavis Anes, MD;  Location: [facility];  Service: General;  Laterality: Left;   RADIOLOGY WITH ANESTHESIA Left 05/20/2018   Procedure: CT WITH ANESTHESIA LEFT RENAL CRYOABLATION AND BIOPSY;  Surgeon: Vanice Sharper,  MD;  Location: [facility];  Service: Anesthesiology;  Laterality: Left;   RIGHT ROTATOR  CUFF REPAIR/   BICEP REPAIR  10-17-2011  DR SUPPLE   SUBTOTAL COLECTOMY  2007   URETEROSCOPY WITH HOLMIUM LASER LITHOTRIPSY Left 07/14/2014   Procedure: URETEROSCOPY  EXTRACTION OF STONES;  Surgeon: Garnette Shack, MD;  Location: [facility];  Service: Urology;  Laterality: Left;    Home Medications:  Allergies as of 11/11/2023       Reactions   Latex Rash   Morphine And Codeine Other (See Comments)   Hallucinations    Tape Rash   Certain tapes         Medication List        Accurate as of November 11, 2023  9:38 AM. If you have any questions, ask your nurse or doctor.          cyanocobalamin  1000 MCG/ML injection Commonly known as: VITAMIN B12 Inject 1,000 mcg into the muscle every 21 ( twenty-one) days.   lidocaine -prilocaine  cream Commonly known as: EMLA  Apply to affected area once   ondansetron  8 MG tablet Commonly known as: Zofran  Take 1 tablet (8 mg total) by mouth every 8 (eight) hours as needed for nausea or vomiting.   oxyCODONE  5 MG immediate release tablet Commonly known as: Oxy IR/ROXICODONE  Take 1 tablet (5 mg total) by mouth every 6 (six) hours as needed.   Potassium Citrate  15 MEQ (1620 MG) Tbcr Take 1,620 tablets by mouth in the morning, at noon, and at bedtime.   prochlorperazine  10 MG tablet Commonly known as: COMPAZINE  TAKE ONE TABLET BY MOUTH EVERY 6 HOURS AS NEEDED FOR NAUSEA AND VOMITING        Allergies:  Allergies  Allergen Reactions   Latex Rash   Morphine And Codeine Other (See Comments)    Hallucinations    Tape Rash    Certain tapes     Family History  Problem Relation Age of Onset   Cancer Son    Early death Son    Cancer Mother    Cancer Maternal Grandfather     Social History:  reports that she has never smoked. She has never used smokeless tobacco. She reports that she does not currently use alcohol. She reports  that she does not use drugs.  ROS: A complete review of systems was performed.  All systems are negative except for pertinent findings as noted.  Physical Exam:  Vital signs in last 24 hours: BP 117/75   Pulse 80  Constitutional:  Alert and oriented, No acute distress Cardiovascular: Regular rate  Respiratory: Normal respiratory effort Neurologic: Grossly intact, no focal deficits Psychiatric: Normal mood and affect  I have reviewed prior pt notes  I have reviewed urinalysis results--clear  Most recent CT scan as well as comparison film from last year reviewed with the patient.  Similar stone burden on the left side.  No ureteral calculi.

## 2023-11-11 NOTE — Progress Notes (Signed)
 24 HOUR POST CHEMOTHERAPY CALL BACK:  Patient is feeling well with no side effects from chemotherapy.  Patient advised to call with any new questions or concerns.

## 2023-11-12 ENCOUNTER — Encounter (HOSPITAL_COMMUNITY): Payer: Self-pay

## 2023-11-12 ENCOUNTER — Ambulatory Visit (INDEPENDENT_AMBULATORY_CARE_PROVIDER_SITE_OTHER): Admitting: Gastroenterology

## 2023-11-12 DIAGNOSIS — C241 Malignant neoplasm of ampulla of Vater: Secondary | ICD-10-CM | POA: Diagnosis not present

## 2023-11-12 DIAGNOSIS — C784 Secondary malignant neoplasm of small intestine: Secondary | ICD-10-CM | POA: Diagnosis not present

## 2023-11-12 LAB — SURGICAL PATHOLOGY

## 2023-11-30 NOTE — Assessment & Plan Note (Addendum)
 Patient has MLH1 loss of expression, MSI high consistent with Lynch syndrome  - Patient was undergoing frequent surveillance for Lynch syndrome - Reportedly had genetic evaluation done at [facility] in 2007.  Will refer to genetic counseling at this time as I could not find the report

## 2023-11-30 NOTE — Progress Notes (Signed)
 Patient Care Team: [name] [name], FNP as PCP - General (Family Medicine) Matilda Senior, MD as Consulting Physician (Urology) [facility]ll[facility], [facility]  Lloyd Savannah, MD as Referring Physician (Dermatology) Davonna Siad, MD as Medical Oncologist (Medical Oncology) Celestia Joesph SQUIBB, RN as Oncology Nurse Navigator (Medical Oncology)  Clinic Day:  12/01/2023  Referring physician: [name] [name], FNP   CHIEF COMPLAINT:  CC: Ampullary Adenocarcinoma/ Lynch syndrome   ASSESSMENT & PLAN:   Assessment & Plan: Nicole [name]  is a 73 y.o. 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 Dasie and is reluctant to assess this considering her son had this procedure when he was very young and had complications from it Biopsy of duodenal ulcer consistent with adenocarcinoma PET scan showed duodenal/head of pancreas mass along with multiple lymph nodes in cervical, axillary, supraclavicular, internal mammary, paratracheal and retroperitoneal regions. Normal CEA and CA 19-9  - Excisional lymph node biopsy consistent with follicular lymphoma -Port placed on 11/03/2023 -C2D1 of pembrolizumab . Tolerated treatment well. -Continue Pembrolizumab  200mg  every 3 weeks - Labs reviewed today: CMP: Normal creatinine, normal LFTs, alk phos slightly elevated.  CBC: WNL, TSH and T4 WNL -Reemphasized common side effects detected with pembrolizumab  includes rash, diarrhea, thyroid  abnormalities, possible pneumonitis and hepatitis. -Discussed that goal of immunotherapy would be to see if there will be resolution of carcinoma so that patient can avoid Whipple procedure. - Pending genetic evaluation  RTC in 6 weeks for follow up Renal cell carcinoma of left kidney (HCC) History of left renal cell carcinoma s/p partial nephrectomy Recurrence s/p ablation Patient  did not receive adjuvant therapy-no indication.  - Continue to follow with urology Malignant neoplasm of colon, unspecified part of colon [facility]) Patient with a history of colon cancer diagnosed in 2007 reportedly 3 times s/p hemicolectomy with ileocecal anastomosis Was undergoing regular surveillance for Lynch syndrome Lynch syndrome Patient has MLH1 loss of expression, MSI high consistent with Lynch syndrome  - Patient was undergoing frequent surveillance for Lynch syndrome - Reportedly had genetic evaluation done at [facility] in 2007.  Will refer to genetic counseling at this time as I could not find the report Gastric reflux Will prescribe pantoprazole  40 mg daily    The patient understands the plans discussed today and is in agreement with them.  She knows to contact our office if she develops concerns prior to her next appointment.  I provided 30 minutes of face-to-face time during this encounter and > 50% was spent counseling as documented under my assessment and plan.    Nicole [name],acting as a Neurosurgeon for [name], MD.,have documented all relevant documentation on the behalf of [name], MD,as directed by  [name], MD while in the presence of [name], MD.  I, [name] MD, have reviewed the above documentation for accuracy and completeness, and I agree with the above.     [name], MD  [facility] [place] - A DEPT OF [facility] [address] [place] [address] Dept: [phone] Dept Fax: [phone]   No orders of the defined types were placed in this encounter.    ONCOLOGY HISTORY:    Diagnosis: Ampullary Adenocarcinoma   -09/23/2023: abdominal Ultrasound: Cholelithiasis with no acute cholecytitis. Dilated common bile duct 1.7cm with mild intrahepatic biliary dilatation. -10/01/2023: Presented to the ER with Intermittent fevers  and epigastric pain -  10/01/2023 MRCP: Distended gallbladder containing numerous gallstones. Severe intra and extrahepatic biliary ductal dilatation, the common bile duct measuring up to 1.9 cm in caliber and containing multiple small gallstones dependently. The duct however appears to taper to the ampulla without calculus of the ampulla. Findings are highly suspicious for ampullary stricture or perhaps an occult mass. Enlarged retroperitoneal lymph nodes unchanged when compared to examination dated 10/30/2022, left retroperitoneal nodes measuring up to 1.7 x 1.2 cm. - 10/03/2023 ERCP:  Duodenal ulcer 25 mm in its largest dimension that was biopsied. Pathology of duodenal ulcer: Ulcer with glandular atypia suspicious for adenocarcinoma. - 10/04/2023 CT AP: There is some thickening in the area of the pancreatic head with a small cystic area and narrowing of the distal common duct at the level of the ampulla with some mucosal thickening. Recommend further evaluation to exclude an ampullary lesion when clinically appropriate. Stable multifocal atrophy of the left kidney with distortion and stones. Stable numerous enlarged lymph nodes in the retroperitoneum and retrocrural spaces going back to August 2024.  - 10/04/2023 CEA: 2.4, CA 19-9: 13  - 10/16/2023 EUS/ERCP with biopsy and stent placement.  -Pathology of ampulla of Vater mass biopsy: Invasive adenocarcinoma, moderately differentiated with mucinous features, MMR deficient.  -Comments: Immunostains for mismatch repair (MMR) protein expression are performed (A1).  -MSI-H: There is loss of nuclear expression of MLH1 and PMS2, and intact nuclear expression of MSH2, and MSH6 within the tumor cells.  - 10/17/2023 CT AP: Suspected tumor within the descending duodenum, ampulla, or head of pancreas. The lesion is ill-defined, and the margins are difficult to measure. Abdominal adenopathy, unchanged from the previous exam. Interval placement of a transbiliary biliary-to-gastric drainage  catheter with multiple gallstones and mild gallbladder wall thickening. Interval improvement in fusiform dilatation of the common bile duct. Multiple common bile duct stones are again seen.  - 10/23/2023: PET CT: Multiple tracer avid lymph nodes in the cervical, axillary, supraclavicular, internal mammary, paratracheal, and retroperitoneal regions. Suspicious for nodal metastasis. If tissue confirmation as clinically indicated ultrasound-guided percutaneous needle biopsy of the supraclavicular or axillary lymph nodes may be considered. Mild radiotracer activity in the region of the descending duodenum/head of pancreas with SUV max of 4.3. this corresponds to the location of the known malignancy. Transbiliary biliary-to-gastric drainage catheter with multiple gallstones and mild gallbladder wall thickening. Pneumobilia is identified. Increased uptake within the left lobe of liver along the catheter track is likely inflammatory in nature. -11/03/2023: Right axillary LN excisional biopsy:  - Follicular lymphoma, low-grade  -11/03/2023: Port placed -11/10/2023-Current: Single agent Pembrolizumab  (200mg  every 3 weeks) -11/12/2023: [facility] NGS: MSI-high, d-MMR, TMB: High, 43 mut/Mb  - ERHGAP35, ARID1A, CBC73, CDK12, EPHA2, FNCD2, FBXW7, FOXA1, HNF1A,KMT2A,KMT2D,MLH1,MRE11,PRKDC,RBM10,SOX9,TRAF7: pathogenic variants present -BRAF,NTRK1/2/3,RET,ALK,APC,BRCA1/2,HER2/ERBB2,FGFR2,KRAS,NF1,NRG1,PD-L1,PIK3CA,ROS1,TP53 : Negative     Lynch syndrome:   - MLH1 loss of expression - Multiple colon cancer s/p colectomy - TAH and BSO in 1993 - Left renal cell carcinoma - Follicular lymphoma -Multiple skin cancer status post Mohs procedure - Extensive family history of multiple cancers in multiple family members   Left Renal Clear Cell Carcinoma:  -04/17/2005: Left partial nephrectomy, stage T1 clear-cell renal cell carcinoma. [place]hman nuclear grade 3 of 4. Margins free to tumor - 04/21/2018: MRI of abdomen showed old  left upper pole renal lesion suspicious for recurrent renal cell carcinoma. - 05/20/2018: Left renal mass biopsy and cryoablation-pathology consistent with clear-cell renal cell carcinoma - CT CAP 10/30/2022: NED   Colon cancer: (mostly per documentation- limited  records)   - 08/30/2005: Colonoscopy: Distal sigmoid colon biopsy: Adenocarcinoma - 10/07/2005: [facility] Path review: Loss of expression of MLH1.  MSI high - Colon cancer x3, s/p subtotal colectomy with ileorectal anastomosis by Dr. Cathlyn Samples at [facility] on 12/11/2005 - No adjuvant chemotherapy   Follicular lymphoma (per documentation- limited records):   - Well-differentiated with CD20 positivity, grade 1 found at the time of sigmoid colon resection in 2007 with 2 involved lymph nodes.   Current Treatment:  Pembrolizumab   INTERVAL HISTORY:   Nicole [name] is here today for follow up.  Patient reports some symptoms of gastric reflux today with fullness and heartburn.  She denies nausea, vomiting, loss of appetite, weight loss.  She tolerated first cycle of pembrolizumab  well.  She denies rashes, diarrhea, abdominal pain, shortness of breath.  We discussed the biopsy results and that it showed follicular lymphoma.  This is consistent with early stage ampullary adenocarcinoma.  Overall, she is doing well.  I have reviewed the past medical history, past surgical history, social history and family history with the patient and they are unchanged from previous note.  ALLERGIES:  is allergic to latex, morphine and codeine, and tape.  MEDICATIONS:  Current Outpatient Medications  Medication Sig Dispense Refill   cyanocobalamin  (VITAMIN B12) 1000 MCG/ML injection Inject 1,000 mcg into the muscle every 21 ( twenty-one) days.     lidocaine -prilocaine  (EMLA ) cream Apply to affected area once 30 g 3   ondansetron  (ZOFRAN ) 8 MG tablet Take 1 tablet (8 mg total) by mouth every 8 (eight) hours as needed for nausea or vomiting. 30 tablet 1    oxyCODONE  (OXY IR/ROXICODONE ) 5 MG immediate release tablet Take 1 tablet (5 mg total) by mouth every 6 (six) hours as needed. 10 tablet 0   pantoprazole  (PROTONIX ) 40 MG tablet Take 1 tablet (40 mg total) by mouth daily. 30 tablet 3   Potassium Citrate  15 MEQ (1620 MG) TBCR Take 1,620 tablets by mouth in the morning, at noon, and at bedtime.     prochlorperazine  (COMPAZINE ) 10 MG tablet TAKE ONE TABLET BY MOUTH EVERY 6 HOURS AS NEEDED FOR NAUSEA AND VOMITING 30 tablet 1   No current facility-administered medications for this visit.   Facility-Administered Medications Ordered in Other Visits  Medication Dose Route Frequency Provider Last Rate Last Admin   0.9 %  sodium chloride  infusion   Intravenous Continuous Rosselyn Martha, MD   Stopped at 12/01/23 1304    REVIEW OF SYSTEMS:   Constitutional: Denies fevers, chills or abnormal weight loss Eyes: Denies blurriness of vision Ears, nose, mouth, throat, and face: Denies mucositis or sore throat Respiratory: Denies cough, dyspnea or wheezes Cardiovascular: Denies palpitation, chest discomfort or lower extremity swelling Gastrointestinal:  Denies nausea, heartburn or change in bowel habits Skin: Denies abnormal skin rashes Lymphatics: Denies new lymphadenopathy or easy bruising Neurological:Denies numbness, tingling or new weaknesses Behavioral/Psych: Mood is stable, no new changes  All other systems were reviewed with the patient and are negative.   VITALS:  Blood pressure (!) 143/72, weight 183 lb 1.6 oz (83.1 kg).  Wt Readings from Last 3 Encounters:  12/01/23 183 lb 1.6 oz (83.1 kg)  11/10/23 184 lb (83.5 kg)  11/03/23 180 lb 12.4 oz (82 kg)    Body mass index is 36.36 kg/m.  Performance status (ECOG): 1 - Symptomatic but completely ambulatory  PHYSICAL EXAM:   GENERAL:alert, no distress and comfortable SKIN: skin color, texture, turgor are normal, no rashes or significant lesions  EYES: normal, Conjunctiva are pink and  non-injected, sclera clear OROPHARYNX:no exudate, no erythema and lips, buccal mucosa, and tongue normal  NECK: supple, thyroid  normal size, non-tender, without nodularity LYMPH:  no palpable lymphadenopathy in the cervical, axillary or inguinal LUNGS: clear to auscultation and percussion with normal breathing effort HEART: regular rate & rhythm and no murmurs and no lower extremity edema ABDOMEN:abdomen soft, non-tender and normal bowel sounds Musculoskeletal:no cyanosis of digits and no clubbing  NEURO: alert & oriented x 3 with fluent speech, no focal motor/sensory deficits  LABORATORY DATA:  I have reviewed the data as listed    Component Value Date/Time   NA 138 12/01/2023 0942   K 3.8 12/01/2023 0942   CL 104 12/01/2023 0942   CO2 22 12/01/2023 0942   GLUCOSE 131 (H) 12/01/2023 0942   BUN 19 12/01/2023 0942   CREATININE 0.85 12/01/2023 0942   CREATININE 0.89 07/29/2023 0936   CALCIUM 8.6 (L) 12/01/2023 0942   PROT 6.4 (L) 12/01/2023 0942   ALBUMIN 3.3 (L) 12/01/2023 0942   AST 30 12/01/2023 0942   ALT 30 12/01/2023 0942   ALKPHOS 154 (H) 12/01/2023 0942   BILITOT 0.7 12/01/2023 0942   GFRNONAA >60 12/01/2023 0942   GFRAA >60 09/16/2019 0951    Lab Results  Component Value Date   WBC 6.6 12/01/2023   NEUTROABS 4.2 12/01/2023   HGB 12.4 12/01/2023   HCT 39.0 12/01/2023   MCV 90.9 12/01/2023   PLT 185 12/01/2023    Latest Reference Range & Units 11/10/23 08:32  TSH 0.350 - 4.500 uIU/mL 0.838  Thyroxine (T4) 4.5 - 12.0 ug/dL 7.5   PATHOLOGY:  FINAL MICROSCOPIC DIAGNOSIS:   A. LYMPH NODE, RIGHT AXILLARY, EXCISION:  -  Follicular lymphoma, low-grade   Note: The lymph node consists of a infiltrate of variably sized nodules  consisting predominantly of small cleaved/centrocyte like cells with a  relative paucity of large noncleaved/centroblast-like cells.  Focally  there is evidence of extracapsular infiltration.  The cells of interest  are CD20 positive B cells  with a germinal center/follicle phenotype  (CD10/BCL6 positive) with strong aberrant BCL2 staining.  The  proliferation rate by Ki-67 is low (<5%).  CD3/CD5 highlight background  small T cells.  CD21/CD23 highlight a well-preserved follicular  dendritic cell meshwork.  Cyclin D1 is appropriately negative in the  lymphocytes.  These findings are consistent with a low-grade (grade 1 of  3) follicular lymphoma.   RADIOGRAPHIC STUDIES: I have personally reviewed the radiological images as listed and agreed with the findings in the report.  DG Chest Port 1 View CLINICAL DATA:  [id number] Port-A-Cath in place [id number]  EXAM: PORTABLE CHEST 1 VIEW  COMPARISON:  10/17/2023.  FINDINGS: Left-sided Port-A-Cath tip overlies the mid to lower SVC. No pneumothorax. No focal consolidation or pleural effusion. Stable cardiomediastinal contours. Visualized osseous structures are unchanged.  IMPRESSION: Left-sided Port-A-Cath tip overlies the mid to lower SVC. No pneumothorax.  Electronically Signed   By: Harrietta Sherry M.D.   On: 11/03/2023 10:55 DG C-Arm 1-60 Min-No Report Fluoroscopy was utilized by the requesting physician.  No radiographic  interpretation.

## 2023-11-30 NOTE — Assessment & Plan Note (Addendum)
 Patient with a history of colon cancer diagnosed in 2007 reportedly 3 times s/p hemicolectomy with ileocecal anastomosis Was undergoing regular surveillance for Lynch syndrome

## 2023-11-30 NOTE — Assessment & Plan Note (Addendum)
 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 Dasie and is reluctant to assess this considering her son had this procedure when he was very young and had complications from it Biopsy of duodenal ulcer consistent with adenocarcinoma PET scan showed duodenal/head of pancreas mass along with multiple lymph nodes in cervical, axillary, supraclavicular, internal mammary, paratracheal and retroperitoneal regions. Normal CEA and CA 19-9  - Excisional lymph node biopsy consistent with follicular lymphoma -Port placed on 11/03/2023 -C2D1 of pembrolizumab . Tolerated treatment well. -Continue Pembrolizumab  200mg  every 3 weeks - Labs reviewed today: CMP: Normal creatinine, normal LFTs, alk phos slightly elevated.  CBC: WNL, TSH and T4 WNL -Reemphasized common side effects detected with pembrolizumab  includes rash, diarrhea, thyroid  abnormalities, possible pneumonitis and hepatitis. -Discussed that goal of immunotherapy would be to see if there will be resolution of carcinoma so that patient can avoid Whipple procedure. - Pending genetic evaluation  RTC in 6 weeks for follow up

## 2023-11-30 NOTE — Assessment & Plan Note (Addendum)
 History of left renal cell carcinoma s/p partial nephrectomy Recurrence s/p ablation Patient did not receive adjuvant therapy-no indication.  - Continue to follow with urology

## 2023-12-01 ENCOUNTER — Inpatient Hospital Stay: Attending: Oncology

## 2023-12-01 ENCOUNTER — Inpatient Hospital Stay (HOSPITAL_BASED_OUTPATIENT_CLINIC_OR_DEPARTMENT_OTHER): Admitting: Oncology

## 2023-12-01 ENCOUNTER — Inpatient Hospital Stay

## 2023-12-01 VITALS — BP 143/72 | Wt 183.1 lb

## 2023-12-01 VITALS — BP 151/69 | HR 63 | Temp 97.4°F | Resp 20

## 2023-12-01 DIAGNOSIS — Z1509 Genetic susceptibility to other malignant neoplasm: Secondary | ICD-10-CM | POA: Insufficient documentation

## 2023-12-01 DIAGNOSIS — Z8572 Personal history of non-Hodgkin lymphomas: Secondary | ICD-10-CM | POA: Diagnosis not present

## 2023-12-01 DIAGNOSIS — K269 Duodenal ulcer, unspecified as acute or chronic, without hemorrhage or perforation: Secondary | ICD-10-CM | POA: Diagnosis not present

## 2023-12-01 DIAGNOSIS — R21 Rash and other nonspecific skin eruption: Secondary | ICD-10-CM | POA: Diagnosis not present

## 2023-12-01 DIAGNOSIS — Z85528 Personal history of other malignant neoplasm of kidney: Secondary | ICD-10-CM | POA: Insufficient documentation

## 2023-12-01 DIAGNOSIS — Z85038 Personal history of other malignant neoplasm of large intestine: Secondary | ICD-10-CM | POA: Insufficient documentation

## 2023-12-01 DIAGNOSIS — C189 Malignant neoplasm of colon, unspecified: Secondary | ICD-10-CM

## 2023-12-01 DIAGNOSIS — R590 Localized enlarged lymph nodes: Secondary | ICD-10-CM | POA: Diagnosis not present

## 2023-12-01 DIAGNOSIS — Z905 Acquired absence of kidney: Secondary | ICD-10-CM | POA: Diagnosis not present

## 2023-12-01 DIAGNOSIS — R197 Diarrhea, unspecified: Secondary | ICD-10-CM | POA: Insufficient documentation

## 2023-12-01 DIAGNOSIS — Z5112 Encounter for antineoplastic immunotherapy: Secondary | ICD-10-CM | POA: Diagnosis not present

## 2023-12-01 DIAGNOSIS — K219 Gastro-esophageal reflux disease without esophagitis: Secondary | ICD-10-CM | POA: Diagnosis not present

## 2023-12-01 DIAGNOSIS — C241 Malignant neoplasm of ampulla of Vater: Secondary | ICD-10-CM

## 2023-12-01 DIAGNOSIS — Z79899 Other long term (current) drug therapy: Secondary | ICD-10-CM | POA: Diagnosis not present

## 2023-12-01 DIAGNOSIS — C642 Malignant neoplasm of left kidney, except renal pelvis: Secondary | ICD-10-CM | POA: Diagnosis not present

## 2023-12-01 DIAGNOSIS — K8020 Calculus of gallbladder without cholecystitis without obstruction: Secondary | ICD-10-CM | POA: Diagnosis not present

## 2023-12-01 LAB — CBC WITH DIFFERENTIAL/PLATELET
Abs Immature Granulocytes: 0.03 K/uL (ref 0.00–0.07)
Basophils Absolute: 0.1 K/uL (ref 0.0–0.1)
Basophils Relative: 2 %
Eosinophils Absolute: 0.3 K/uL (ref 0.0–0.5)
Eosinophils Relative: 4 %
HCT: 39 % (ref 36.0–46.0)
Hemoglobin: 12.4 g/dL (ref 12.0–15.0)
Immature Granulocytes: 1 %
Lymphocytes Relative: 19 %
Lymphs Abs: 1.3 K/uL (ref 0.7–4.0)
MCH: 28.9 pg (ref 26.0–34.0)
MCHC: 31.8 g/dL (ref 30.0–36.0)
MCV: 90.9 fL (ref 80.0–100.0)
Monocytes Absolute: 0.7 K/uL (ref 0.1–1.0)
Monocytes Relative: 11 %
Neutro Abs: 4.2 K/uL (ref 1.7–7.7)
Neutrophils Relative %: 63 %
Platelets: 185 K/uL (ref 150–400)
RBC: 4.29 MIL/uL (ref 3.87–5.11)
RDW: 14.5 % (ref 11.5–15.5)
WBC: 6.6 K/uL (ref 4.0–10.5)
nRBC: 0 % (ref 0.0–0.2)

## 2023-12-01 LAB — COMPREHENSIVE METABOLIC PANEL WITH GFR
ALT: 30 U/L (ref 0–44)
AST: 30 U/L (ref 15–41)
Albumin: 3.3 g/dL — ABNORMAL LOW (ref 3.5–5.0)
Alkaline Phosphatase: 154 U/L — ABNORMAL HIGH (ref 38–126)
Anion gap: 12 (ref 5–15)
BUN: 19 mg/dL (ref 8–23)
CO2: 22 mmol/L (ref 22–32)
Calcium: 8.6 mg/dL — ABNORMAL LOW (ref 8.9–10.3)
Chloride: 104 mmol/L (ref 98–111)
Creatinine, Ser: 0.85 mg/dL (ref 0.44–1.00)
GFR, Estimated: >60 mL/min (ref >60)
Glucose, Bld: 131 mg/dL — ABNORMAL HIGH (ref 70–99)
Potassium: 3.8 mmol/L (ref 3.5–5.1)
Sodium: 138 mmol/L (ref 135–145)
Total Bilirubin: 0.7 mg/dL (ref 0.0–1.2)
Total Protein: 6.4 g/dL — ABNORMAL LOW (ref 6.5–8.1)

## 2023-12-01 LAB — MAGNESIUM: Magnesium: 1.9 mg/dL (ref 1.7–2.4)

## 2023-12-01 MED ORDER — SODIUM CHLORIDE 0.9 % IV SOLN: INTRAVENOUS | Status: DC

## 2023-12-01 MED ORDER — PANTOPRAZOLE SODIUM 40 MG PO TBEC: 40.0000 mg | DELAYED_RELEASE_TABLET | Freq: Every day | ORAL | 3 refills | Status: AC

## 2023-12-01 MED ORDER — SODIUM CHLORIDE 0.9 % IV SOLN
200.0000 mg | Freq: Once | INTRAVENOUS | Status: AC
  Administered 2023-12-01: 200 mg via INTRAVENOUS
  Filled 2023-12-01: qty 8

## 2023-12-01 NOTE — Assessment & Plan Note (Addendum)
 Will prescribe pantoprazole  40 mg daily

## 2023-12-01 NOTE — Progress Notes (Signed)
 Patient presents today for Keytruda  infusion per providers order.  Vital signs and labs reviewed by MD.  Message received from Isaiah Piety RN/Dr. Davonna, patient okay for treatment.  Treatment given today per MD orders.  Stable during infusion without adverse affects.  Vital signs stable.  No complaints at this time.  Discharge from clinic ambulatory in stable condition.  Alert and oriented X 3.  Follow up with [facility] as scheduled.

## 2023-12-01 NOTE — Patient Instructions (Signed)
 [facility] [place] - A DEPT OF [facility]  Discharge Instructions: Thank you for choosing [facility] to provide your oncology and hematology care.  If you have a lab appointment with the Cancer Center - please note that after April 8th, 2024, all labs will be drawn in the cancer center.  You do not have to check in or register with the main entrance as you have in the past but will complete your check-in in the cancer center.  Wear comfortable clothing and clothing appropriate for easy access to any Portacath or PICC line.   We strive to give you quality time with your provider. You may need to reschedule your appointment if you arrive late (15 or more minutes).  Arriving late affects you and other patients whose appointments are after yours.  Also, if you miss three or more appointments without notifying the office, you may be dismissed from the clinic at the provider's discretion.      For prescription refill requests, have your pharmacy contact our office and allow 72 hours for refills to be completed.    Today you received the following chemotherapy and/or immunotherapy agents Keytruda      To help prevent nausea and vomiting after your treatment, we encourage you to take your nausea medication as directed.  BELOW ARE SYMPTOMS THAT SHOULD BE REPORTED IMMEDIATELY: *FEVER GREATER THAN 100.4 F (38 C) OR HIGHER *CHILLS OR SWEATING *NAUSEA AND VOMITING THAT IS NOT CONTROLLED WITH YOUR NAUSEA MEDICATION *UNUSUAL SHORTNESS OF BREATH *UNUSUAL BRUISING OR BLEEDING *URINARY PROBLEMS (pain or burning when urinating, or frequent urination) *BOWEL PROBLEMS (unusual diarrhea, constipation, pain near the anus) TENDERNESS IN MOUTH AND THROAT WITH OR WITHOUT PRESENCE OF ULCERS (sore throat, sores in mouth, or a toothache) UNUSUAL RASH, SWELLING OR PAIN  UNUSUAL VAGINAL DISCHARGE OR ITCHING   Items with * indicate a potential emergency and should be followed up  as soon as possible or go to the Emergency Department if any problems should occur.  Please show the CHEMOTHERAPY ALERT CARD or IMMUNOTHERAPY ALERT CARD at check-in to the Emergency Department and triage nurse.  Should you have questions after your visit or need to cancel or reschedule your appointment, please contact [facility]  - A DEPT OF [facility] [phone]  and follow the prompts.  Office hours are 8:00 a.m. to 4:30 p.m. Monday - Friday. Please note that voicemails left after 4:00 p.m. may not be returned until the following business day.  We are closed weekends and major holidays. You have access to a nurse at all times for urgent questions. Please call the main number to the clinic [phone] and follow the prompts.  For any non-urgent questions, you may also contact your provider using MyChart. We now offer e-Visits for anyone 30 and older to request care online for non-urgent symptoms. For details visit [email or web address].   Also download the MyChart app! Go to the app store, search "MyChart", open the app, select [place], and log in with your MyChart username and password.

## 2023-12-01 NOTE — Patient Instructions (Signed)

## 2023-12-11 DIAGNOSIS — L603 Nail dystrophy: Secondary | ICD-10-CM | POA: Diagnosis not present

## 2023-12-11 DIAGNOSIS — B078 Other viral warts: Secondary | ICD-10-CM | POA: Diagnosis not present

## 2023-12-15 ENCOUNTER — Other Ambulatory Visit: Payer: Self-pay | Admitting: Oncology

## 2023-12-15 DIAGNOSIS — C241 Malignant neoplasm of ampulla of Vater: Secondary | ICD-10-CM

## 2023-12-22 ENCOUNTER — Inpatient Hospital Stay: Attending: Oncology

## 2023-12-22 ENCOUNTER — Inpatient Hospital Stay

## 2023-12-22 VITALS — BP 150/73 | HR 77 | Temp 98.3°F | Resp 20 | Wt 182.0 lb

## 2023-12-22 DIAGNOSIS — C241 Malignant neoplasm of ampulla of Vater: Secondary | ICD-10-CM

## 2023-12-22 DIAGNOSIS — Z79899 Other long term (current) drug therapy: Secondary | ICD-10-CM | POA: Diagnosis not present

## 2023-12-22 DIAGNOSIS — Z23 Encounter for immunization: Secondary | ICD-10-CM | POA: Insufficient documentation

## 2023-12-22 DIAGNOSIS — C20 Malignant neoplasm of rectum: Secondary | ICD-10-CM | POA: Diagnosis not present

## 2023-12-22 DIAGNOSIS — Z5112 Encounter for antineoplastic immunotherapy: Secondary | ICD-10-CM | POA: Diagnosis not present

## 2023-12-22 LAB — COMPREHENSIVE METABOLIC PANEL WITH GFR
ALT: 45 U/L — ABNORMAL HIGH (ref 0–44)
AST: 48 U/L — ABNORMAL HIGH (ref 15–41)
Albumin: 4.2 g/dL (ref 3.5–5.0)
Alkaline Phosphatase: 256 U/L — ABNORMAL HIGH (ref 38–126)
Anion gap: 13 (ref 5–15)
BUN: 14 mg/dL (ref 8–23)
CO2: 24 mmol/L (ref 22–32)
Calcium: 9.5 mg/dL (ref 8.9–10.3)
Chloride: 104 mmol/L (ref 98–111)
Creatinine, Ser: 0.87 mg/dL (ref 0.44–1.00)
GFR, Estimated: >60 mL/min (ref >60)
Glucose, Bld: 134 mg/dL — ABNORMAL HIGH (ref 70–99)
Potassium: 4.6 mmol/L (ref 3.5–5.1)
Sodium: 141 mmol/L (ref 135–145)
Total Bilirubin: 0.3 mg/dL (ref 0.0–1.2)
Total Protein: 7.1 g/dL (ref 6.5–8.1)

## 2023-12-22 LAB — CBC WITH DIFFERENTIAL/PLATELET
Abs Immature Granulocytes: 0.04 K/uL (ref 0.00–0.07)
Basophils Absolute: 0.1 K/uL (ref 0.0–0.1)
Basophils Relative: 1 %
Eosinophils Absolute: 0.3 K/uL (ref 0.0–0.5)
Eosinophils Relative: 4 %
HCT: 40.4 % (ref 36.0–46.0)
Hemoglobin: 13.1 g/dL (ref 12.0–15.0)
Immature Granulocytes: 1 %
Lymphocytes Relative: 11 %
Lymphs Abs: 0.8 K/uL (ref 0.7–4.0)
MCH: 29.8 pg (ref 26.0–34.0)
MCHC: 32.4 g/dL (ref 30.0–36.0)
MCV: 91.8 fL (ref 80.0–100.0)
Monocytes Absolute: 0.7 K/uL (ref 0.1–1.0)
Monocytes Relative: 9 %
Neutro Abs: 5.5 K/uL (ref 1.7–7.7)
Neutrophils Relative %: 74 %
Platelets: 176 K/uL (ref 150–400)
RBC: 4.4 MIL/uL (ref 3.87–5.11)
RDW: 14.2 % (ref 11.5–15.5)
WBC: 7.5 K/uL (ref 4.0–10.5)
nRBC: 0 % (ref 0.0–0.2)

## 2023-12-22 LAB — MAGNESIUM: Magnesium: 2.2 mg/dL (ref 1.7–2.4)

## 2023-12-22 LAB — TSH: TSH: 1.25 u[IU]/mL (ref 0.350–4.500)

## 2023-12-22 MED ORDER — ALTEPLASE 2 MG IJ SOLR
2.0000 mg | Freq: Once | INTRAMUSCULAR | Status: AC | PRN
  Administered 2023-12-22: 2 mg
  Filled 2023-12-22: qty 2

## 2023-12-22 MED ORDER — SODIUM CHLORIDE 0.9 % IV SOLN
200.0000 mg | Freq: Once | INTRAVENOUS | Status: AC
  Administered 2023-12-22: 200 mg via INTRAVENOUS
  Filled 2023-12-22: qty 8

## 2023-12-22 MED ORDER — SODIUM CHLORIDE 0.9 % IV SOLN: INTRAVENOUS | Status: DC

## 2023-12-22 NOTE — Progress Notes (Signed)
 Port flushes with ease. No blood return noted. No pain, swelling, discomfort, redness while flushing. Labs drawn peripherally per orders. Alteplase  administered at 1136. MD and primary nurse made aware.   Nicole [name]

## 2023-12-22 NOTE — Progress Notes (Signed)
 No blood return noted after Alteplase .  Reviewed with Dr. Davonna with verbal order ok to treat.  Port flushes easily and no complaints of pain with flush.  Alteplase  withdrawn with no complaints voiced.   Patient tolerated therapy with no complaints voiced.  Side effects with management reviewed with understanding verbalized.  Port site clean and dry with no bruising or swelling noted at site.  Good blood return noted before and after administration of therapy.  Band aid applied.  Patient left in satisfactory condition with VSS and no s/s of distress noted.

## 2023-12-22 NOTE — Patient Instructions (Signed)
 [facility] [place] - A DEPT OF [facility]  Discharge Instructions: Thank you for choosing [facility] to provide your oncology and hematology care.  If you have a lab appointment with the Cancer Center - please note that after April 8th, 2024, all labs will be drawn in the cancer center.  You do not have to check in or register with the main entrance as you have in the past but will complete your check-in in the cancer center.  Wear comfortable clothing and clothing appropriate for easy access to any Portacath or PICC line.   We strive to give you quality time with your provider. You may need to reschedule your appointment if you arrive late (15 or more minutes).  Arriving late affects you and other patients whose appointments are after yours.  Also, if you miss three or more appointments without notifying the office, you may be dismissed from the clinic at the provider's discretion.      For prescription refill requests, have your pharmacy contact our office and allow 72 hours for refills to be completed.    Today you received the following chemotherapy and/or immunotherapy agents keytruda     To help prevent nausea and vomiting after your treatment, we encourage you to take your nausea medication as directed.  BELOW ARE SYMPTOMS THAT SHOULD BE REPORTED IMMEDIATELY: *FEVER GREATER THAN 100.4 F (38 C) OR HIGHER *CHILLS OR SWEATING *NAUSEA AND VOMITING THAT IS NOT CONTROLLED WITH YOUR NAUSEA MEDICATION *UNUSUAL SHORTNESS OF BREATH *UNUSUAL BRUISING OR BLEEDING *URINARY PROBLEMS (pain or burning when urinating, or frequent urination) *BOWEL PROBLEMS (unusual diarrhea, constipation, pain near the anus) TENDERNESS IN MOUTH AND THROAT WITH OR WITHOUT PRESENCE OF ULCERS (sore throat, sores in mouth, or a toothache) UNUSUAL RASH, SWELLING OR PAIN  UNUSUAL VAGINAL DISCHARGE OR ITCHING   Items with * indicate a potential emergency and should be followed up as  soon as possible or go to the Emergency Department if any problems should occur.  Please show the CHEMOTHERAPY ALERT CARD or IMMUNOTHERAPY ALERT CARD at check-in to the Emergency Department and triage nurse.  Should you have questions after your visit or need to cancel or reschedule your appointment, please contact [facility] [place] - A DEPT OF [facility] [phone]  and follow the prompts.  Office hours are 8:00 a.m. to 4:30 p.m. Monday - Friday. Please note that voicemails left after 4:00 p.m. may not be returned until the following business day.  We are closed weekends and major holidays. You have access to a nurse at all times for urgent questions. Please call the main number to the clinic [phone] and follow the prompts.  For any non-urgent questions, you may also contact your provider using MyChart. We now offer e-Visits for anyone 47 and older to request care online for non-urgent symptoms. For details visit [email or web address].   Also download the MyChart app! Go to the app store, search MyChart, open the app, select  [place], and log in with your MyChart username and password.

## 2023-12-23 LAB — T4: T4, Total: 8.4 ug/dL (ref 4.5–12.0)

## 2024-01-12 ENCOUNTER — Inpatient Hospital Stay

## 2024-01-12 ENCOUNTER — Inpatient Hospital Stay: Admitting: Oncology

## 2024-01-12 ENCOUNTER — Inpatient Hospital Stay: Admitting: Dietician

## 2024-01-12 VITALS — BP 136/81 | HR 2 | Temp 97.9°F | Resp 19

## 2024-01-12 DIAGNOSIS — Z1507 Genetic susceptibility to malignant neoplasm of urinary tract: Secondary | ICD-10-CM

## 2024-01-12 DIAGNOSIS — K219 Gastro-esophageal reflux disease without esophagitis: Secondary | ICD-10-CM | POA: Diagnosis not present

## 2024-01-12 DIAGNOSIS — C189 Malignant neoplasm of colon, unspecified: Secondary | ICD-10-CM

## 2024-01-12 DIAGNOSIS — C241 Malignant neoplasm of ampulla of Vater: Secondary | ICD-10-CM

## 2024-01-12 DIAGNOSIS — Z1509 Genetic susceptibility to other malignant neoplasm: Secondary | ICD-10-CM | POA: Diagnosis not present

## 2024-01-12 DIAGNOSIS — Z15068 Genetic susceptibility to other malignant neoplasm of digestive system: Secondary | ICD-10-CM | POA: Diagnosis not present

## 2024-01-12 DIAGNOSIS — Z23 Encounter for immunization: Secondary | ICD-10-CM | POA: Diagnosis not present

## 2024-01-12 DIAGNOSIS — Z15060 Genetic susceptibility to colorectal cancer: Secondary | ICD-10-CM | POA: Diagnosis not present

## 2024-01-12 LAB — COMPREHENSIVE METABOLIC PANEL WITH GFR
ALT: 47 U/L — ABNORMAL HIGH (ref 0–44)
AST: 53 U/L — ABNORMAL HIGH (ref 15–41)
Albumin: 4 g/dL (ref 3.5–5.0)
Alkaline Phosphatase: 323 U/L — ABNORMAL HIGH (ref 38–126)
Anion gap: 12 (ref 5–15)
BUN: 15 mg/dL (ref 8–23)
CO2: 24 mmol/L (ref 22–32)
Calcium: 9 mg/dL (ref 8.9–10.3)
Chloride: 101 mmol/L (ref 98–111)
Creatinine, Ser: 0.9 mg/dL (ref 0.44–1.00)
GFR, Estimated: >60 mL/min (ref >60)
Glucose, Bld: 119 mg/dL — ABNORMAL HIGH (ref 70–99)
Potassium: 4.5 mmol/L (ref 3.5–5.1)
Sodium: 136 mmol/L (ref 135–145)
Total Bilirubin: 0.3 mg/dL (ref 0.0–1.2)
Total Protein: 6.8 g/dL (ref 6.5–8.1)

## 2024-01-12 LAB — MAGNESIUM: Magnesium: 1.9 mg/dL (ref 1.7–2.4)

## 2024-01-12 LAB — CBC WITH DIFFERENTIAL/PLATELET
Abs Immature Granulocytes: 0.04 K/uL (ref 0.00–0.07)
Basophils Absolute: 0.1 K/uL (ref 0.0–0.1)
Basophils Relative: 1 %
Eosinophils Absolute: 0.3 K/uL (ref 0.0–0.5)
Eosinophils Relative: 4 %
HCT: 40.6 % (ref 36.0–46.0)
Hemoglobin: 12.8 g/dL (ref 12.0–15.0)
Immature Granulocytes: 1 %
Lymphocytes Relative: 12 %
Lymphs Abs: 0.9 K/uL (ref 0.7–4.0)
MCH: 28.9 pg (ref 26.0–34.0)
MCHC: 31.5 g/dL (ref 30.0–36.0)
MCV: 91.6 fL (ref 80.0–100.0)
Monocytes Absolute: 0.8 K/uL (ref 0.1–1.0)
Monocytes Relative: 11 %
Neutro Abs: 5.3 K/uL (ref 1.7–7.7)
Neutrophils Relative %: 71 %
Platelets: 171 K/uL (ref 150–400)
RBC: 4.43 MIL/uL (ref 3.87–5.11)
RDW: 13.8 % (ref 11.5–15.5)
WBC: 7.4 K/uL (ref 4.0–10.5)
nRBC: 0 % (ref 0.0–0.2)

## 2024-01-12 LAB — TSH: TSH: 1.05 u[IU]/mL (ref 0.350–4.500)

## 2024-01-12 MED ORDER — LOPERAMIDE HCL 2 MG PO CAPS: 2.0000 mg | ORAL_CAPSULE | ORAL | 2 refills | Status: AC | PRN

## 2024-01-12 MED ORDER — SODIUM CHLORIDE 0.9 % IV SOLN: INTRAVENOUS | Status: DC

## 2024-01-12 MED ORDER — INFLUENZA VAC SPLIT HIGH-DOSE 0.5 ML IM SUSY
0.5000 mL | PREFILLED_SYRINGE | INTRAMUSCULAR | Status: DC
Start: 2024-01-13 — End: 2024-01-12
  Filled 2024-01-12: qty 0.5

## 2024-01-12 MED ORDER — SODIUM CHLORIDE 0.9 % IV SOLN
200.0000 mg | Freq: Once | INTRAVENOUS | Status: AC
  Administered 2024-01-12: 200 mg via INTRAVENOUS
  Filled 2024-01-12: qty 8

## 2024-01-12 MED ORDER — INFLUENZA VAC SPLIT HIGH-DOSE 0.5 ML IM SUSY
0.5000 mL | PREFILLED_SYRINGE | Freq: Once | INTRAMUSCULAR | Status: AC
  Administered 2024-01-12: 0.5 mL via INTRAMUSCULAR

## 2024-01-12 NOTE — Patient Instructions (Signed)

## 2024-01-12 NOTE — Patient Instructions (Signed)
 [facility] - A DEPT OF [facility]  Discharge Instructions: Thank you for choosing [facility] to provide your oncology and hematology care.  If you have a lab appointment with the Cancer Center - please note that after April 8th, 2024, all labs will be drawn in the cancer center.  You do not have to check in or register with the main entrance as you have in the past but will complete your check-in in the cancer center.  Wear comfortable clothing and clothing appropriate for easy access to any Portacath or PICC line.   We strive to give you quality time with your provider. You may need to reschedule your appointment if you arrive late (15 or more minutes).  Arriving late affects you and other patients whose appointments are after yours.  Also, if you miss three or more appointments without notifying the office, you may be dismissed from the clinic at the provider's discretion.      For prescription refill requests, have your pharmacy contact our office and allow 72 hours for refills to be completed.    Today you received the following chemotherapy and/or immunotherapy agents Pembrolizumab  Injection What is this medication? PEMBROLIZUMAB  (PEM broe LIZ ue mab) treats some types of cancer. It works by helping your immune system slow or stop the spread of cancer cells. It is a monoclonal antibody. This medicine may be used for other purposes; ask your health care provider or pharmacist if you have questions. COMMON BRAND NAME(S): Keytruda  What should I tell my care team before I take this medication? They need to know if you have any of these conditions: Allogeneic stem cell transplant (uses someone else's stem cells) Autoimmune diseases, such as Crohn disease, ulcerative colitis, lupus History of chest radiation Nervous system problems, such as Guillain-Barre syndrome, myasthenia gravis Organ transplant An unusual or allergic reaction to  pembrolizumab , other medications, foods, dyes, or preservatives Pregnant or trying to get pregnant Breast-feeding How should I use this medication? This medication is injected into a vein. It is given by your care team in a hospital or clinic setting. A special MedGuide will be given to you before each treatment. Be sure to read this information carefully each time. Talk to your care team about the use of this medication in children. While it may be prescribed for children as young as 6 months for selected conditions, precautions do apply. Overdosage: If you think you have taken too much of this medicine contact a poison control center or emergency room at once. NOTE: This medicine is only for you. Do not share this medicine with others. What if I miss a dose? Keep appointments for follow-up doses. It is important not to miss your dose. Call your care team if you are unable to keep an appointment. What may interact with this medication? Interactions have not been studied. This list may not describe all possible interactions. Give your health care provider a list of all the medicines, herbs, non-prescription drugs, or dietary supplements you use. Also tell them if you smoke, drink alcohol, or use illegal drugs. Some items may interact with your medicine. What should I watch for while using this medication? Your condition will be monitored carefully while you are receiving this medication. You may need blood work while taking this medication. This medication may cause serious skin reactions. They can happen weeks to months after starting the medication. Contact your care team right away if you notice fevers  or flu-like symptoms with a rash. The rash may be red or purple and then turn into blisters or peeling of the skin. You may also notice a red rash with swelling of the face, lips, or lymph nodes in your neck or under your arms. Tell your care team right away if you have any change in your  eyesight. Talk to your care team if you may be pregnant. Serious birth defects can occur if you take this medication during pregnancy and for 4 months after the last dose. You will need a negative pregnancy test before starting this medication. Contraception is recommended while taking this medication and for 4 months after the last dose. Your care team can help you find the option that works for you. Do not breastfeed while taking this medication and for 4 months after the last dose. What side effects may I notice from receiving this medication? Side effects that you should report to your care team as soon as possible: Allergic reactions--skin rash, itching, hives, swelling of the face, lips, tongue, or throat Dry cough, shortness of breath or trouble breathing Eye pain, redness, irritation, or discharge with blurry or decreased vision Heart muscle inflammation--unusual weakness or fatigue, shortness of breath, chest pain, fast or irregular heartbeat, dizziness, swelling of the ankles, feet, or hands Hormone gland problems--headache, sensitivity to light, unusual weakness or fatigue, dizziness, fast or irregular heartbeat, increased sensitivity to cold or heat, excessive sweating, constipation, hair loss, increased thirst or amount of urine, tremors or shaking, irritability Infusion reactions--chest pain, shortness of breath or trouble breathing, feeling faint or lightheaded Kidney injury (glomerulonephritis)--decrease in the amount of urine, red or dark brown urine, foamy or bubbly urine, swelling of the ankles, hands, or feet Liver injury--right upper belly pain, loss of appetite, nausea, light-colored stool, dark yellow or brown urine, yellowing skin or eyes, unusual weakness or fatigue Pain, tingling, or numbness in the hands or feet, muscle weakness, change in vision, confusion or trouble speaking, loss of balance or coordination, trouble walking, seizures Rash, fever, and swollen lymph  nodes Redness, blistering, peeling, or loosening of the skin, including inside the mouth Sudden or severe stomach pain, bloody diarrhea, fever, nausea, vomiting Side effects that usually do not require medical attention (report to your care team if they continue or are bothersome): Bone, joint, or muscle pain Diarrhea Fatigue Loss of appetite Nausea Skin rash This list may not describe all possible side effects. Call your doctor for medical advice about side effects. You may report side effects to FDA at 1-800-FDA-1088. Where should I keep my medication? This medication is given in a hospital or clinic. It will not be stored at home. NOTE: This sheet is a summary. It may not cover all possible information. If you have questions about this medicine, talk to your doctor, pharmacist, or health care provider.  2024 Elsevier/Gold Standard (2021-07-17 00:00:00)      To help prevent nausea and vomiting after your treatment, we encourage you to take your nausea medication as directed.  BELOW ARE SYMPTOMS THAT SHOULD BE REPORTED IMMEDIATELY: *FEVER GREATER THAN 100.4 F (38 C) OR HIGHER *CHILLS OR SWEATING *NAUSEA AND VOMITING THAT IS NOT CONTROLLED WITH YOUR NAUSEA MEDICATION *UNUSUAL SHORTNESS OF BREATH *UNUSUAL BRUISING OR BLEEDING *URINARY PROBLEMS (pain or burning when urinating, or frequent urination) *BOWEL PROBLEMS (unusual diarrhea, constipation, pain near the anus) TENDERNESS IN MOUTH AND THROAT WITH OR WITHOUT PRESENCE OF ULCERS (sore throat, sores in mouth, or a toothache) UNUSUAL RASH, SWELLING OR PAIN  UNUSUAL VAGINAL DISCHARGE OR ITCHING   Items with * indicate a potential emergency and should be followed up as soon as possible or go to the Emergency Department if any problems should occur.  Please show the CHEMOTHERAPY ALERT CARD or IMMUNOTHERAPY ALERT CARD at check-in to the Emergency Department and triage nurse.  Should you have questions after your visit or need to cancel  or reschedule your appointment, please contact [facility] [place] - A DEPT OF [facility] [phone]  and follow the prompts.  Office hours are 8:00 a.m. to 4:30 p.m. Monday - Friday. Please note that voicemails left after 4:00 p.m. may not be returned until the following business day.  We are closed weekends and major holidays. You have access to a nurse at all times for urgent questions. Please call the main number to the clinic [phone] and follow the prompts.  For any non-urgent questions, you may also contact your provider using MyChart. We now offer e-Visits for anyone 28 and older to request care online for non-urgent symptoms. For details visit [email or web address].   Also download the MyChart app! Go to the app store, search MyChart, open the app, select [facility], and log in with your MyChart username and password.

## 2024-01-12 NOTE — Progress Notes (Addendum)
 Patient Care Team: [name] [name], FNP as PCP - General (Family Medicine) Matilda Senior, MD as Consulting Physician (Urology) [facility], [facility]  Lloyd Savannah, MD as Referring Physician (Dermatology) Davonna Siad, MD as Medical Oncologist (Medical Oncology) Celestia Joesph SQUIBB, RN as Oncology Nurse Navigator (Medical Oncology)  Clinic Day:  01/12/2024  Referring physician: [name] [name], FNP   CHIEF COMPLAINT:  CC: Ampullary Adenocarcinoma/ Lynch syndrome   ASSESSMENT & PLAN:   Assessment & Plan: Nicole [name]  is a 73 y.o. 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 Dasie and is reluctant to assess this considering her son had this procedure when he was very young and had complications from it Biopsy of duodenal ulcer consistent with adenocarcinoma PET scan showed duodenal/head of pancreas mass along with multiple lymph nodes in cervical, axillary, supraclavicular, internal mammary, paratracheal and retroperitoneal regions. Normal CEA and CA 19-9 Excisional lymph node biopsy consistent with follicular lymphoma  -C4D1 of pembrolizumab . Tolerated treatment well. -Continue Pembrolizumab  200mg  every 3 weeks - Labs reviewed today: CMP: Normal creatinine, AST: 48, ALT: 45, alk phos: 256-elevated from prior.  CBC: WNL, TSH and T4 WNL -Reemphasized common side effects detected with pembrolizumab  includes rash, diarrhea, thyroid  abnormalities, possible pneumonitis and hepatitis. -Discussed that goal of immunotherapy would be to see if there will be resolution of carcinoma so that patient can avoid Whipple procedure. - Pending genetic evaluation  RTC in 3 weeks with a PET scan  Malignant neoplasm of colon, unspecified part Patient with a history of colon cancer diagnosed in 2007 reportedly 3 times s/p hemicolectomy with ileocecal  anastomosis Was undergoing regular surveillance for Lynch syndrome.  Patient did not have follow-up recently.  Will reach out to  Lynch Syndrome Patient has MLH1 loss of expression, MSI high consistent with Lynch syndrome  - Patient was undergoing frequent surveillance for Lynch syndrome - Reportedly had genetic evaluation done at [facility] in 2007.  Will refer to genetic counseling at this time as I could not find the report  Gastric reflux Continue pantoprazole  40 mg daily   Elevated liver enzymes and alkaline phosphatase Patient has a history of gallstones and would like to follow-up with GI  -I will reach out to Dr. Nandigam for follow-up.  The patient understands the plans discussed today and is in agreement with them.  She knows to contact our office if she develops concerns prior to her next appointment.  The total time spent in the appointment was 20 minutes for the encounter with patient, including review of chart and various tests results, discussions about plan of care and coordination of care plan   I, Marijo Sharps, acting as a scribe for [name], MD.,have documented all relevant documentation on the behalf of [name], MD,as directed by  [name], MD while in the presence of [name], MD.  I, [name] MD, have reviewed the above documentation for accuracy and completeness, and I agree with the above.    [employer]  [facility] [place] - A DEPT OF [facility] [address] [place] [address] Dept: [phone] Dept Fax: [phone]   No orders of the defined types were placed in this encounter.    ONCOLOGY HISTORY:  Diagnosis: Ampullary Adenocarcinoma   -09/23/2023: abdominal Ultrasound: Cholelithiasis with no acute cholecytitis. Dilated common bile duct 1.7cm with mild  intrahepatic biliary dilatation. -10/01/2023: Presented to the ER with Intermittent fevers and  epigastric pain - 10/01/2023 MRCP: Distended gallbladder containing numerous gallstones. Severe intra and extrahepatic biliary ductal dilatation, the common bile duct measuring up to 1.9 cm in caliber and containing multiple small gallstones dependently. The duct however appears to taper to the ampulla without calculus of the ampulla. Findings are highly suspicious for ampullary stricture or perhaps an occult mass. Enlarged retroperitoneal lymph nodes unchanged when compared to examination dated 10/30/2022, left retroperitoneal nodes measuring up to 1.7 x 1.2 cm. - 10/03/2023 ERCP:  Duodenal ulcer 25 mm in its largest dimension that was biopsied. Pathology of duodenal ulcer: Ulcer with glandular atypia suspicious for adenocarcinoma. - 10/04/2023 CT AP: There is some thickening in the area of the pancreatic head with a small cystic area and narrowing of the distal common duct at the level of the ampulla with some mucosal thickening. Recommend further evaluation to exclude an ampullary lesion when clinically appropriate. Stable multifocal atrophy of the left kidney with distortion and stones. Stable numerous enlarged lymph nodes in the retroperitoneum and retrocrural spaces going back to August 2024.  - 10/04/2023 CEA: 2.4, CA 19-9: 13  - 10/16/2023 EUS/ERCP with biopsy and stent placement.  -Pathology of ampulla of Vater mass biopsy: Invasive adenocarcinoma, moderately differentiated with mucinous features, MMR deficient.  -Comments: Immunostains for mismatch repair (MMR) protein expression are performed (A1).  -MSI-H: There is loss of nuclear expression of MLH1 and PMS2, and intact nuclear expression of MSH2, and MSH6 within the tumor cells.  - 10/17/2023 CT AP: Suspected tumor within the descending duodenum, ampulla, or head of pancreas. The lesion is ill-defined, and the margins are difficult to measure. Abdominal adenopathy, unchanged from the previous exam. Interval placement of a transbiliary  biliary-to-gastric drainage catheter with multiple gallstones and mild gallbladder wall thickening. Interval improvement in fusiform dilatation of the common bile duct. Multiple common bile duct stones are again seen.  - 10/23/2023: PET CT: Multiple tracer avid lymph nodes in the cervical, axillary, supraclavicular, internal mammary, paratracheal, and retroperitoneal regions. Suspicious for nodal metastasis. If tissue confirmation as clinically indicated ultrasound-guided percutaneous needle biopsy of the supraclavicular or axillary lymph nodes may be considered. Mild radiotracer activity in the region of the descending duodenum/head of pancreas with SUV max of 4.3. this corresponds to the location of the known malignancy. Transbiliary biliary-to-gastric drainage catheter with multiple gallstones and mild gallbladder wall thickening. Pneumobilia is identified. Increased uptake within the left lobe of liver along the catheter track is likely inflammatory in nature. -11/03/2023: Right axillary LN excisional biopsy:  - Follicular lymphoma, low-grade  -11/03/2023: Port placed -11/10/2023-Current: Single agent Pembrolizumab  (200mg  every 3 weeks) -11/12/2023: [facility] NGS: MSI-high, d-MMR, TMB: High, 43 mut/Mb  - ERHGAP35, ARID1A, CBC73, CDK12, EPHA2, FNCD2, FBXW7, FOXA1, HNF1A,KMT2A,KMT2D,MLH1,MRE11,PRKDC,RBM10,SOX9,TRAF7: pathogenic variants present -BRAF,NTRK1/2/3,RET,ALK,APC,BRCA1/2,HER2/ERBB2,FGFR2,KRAS,NF1,NRG1,PD-L1,PIK3CA,ROS1,TP53 : Negative     Lynch syndrome: - MLH1 loss of expression - Multiple colon cancer s/p colectomy - TAH and BSO in 1993 - Left renal cell carcinoma - Follicular lymphoma -Multiple skin cancer status post Mohs procedure - Extensive family history of multiple cancers in multiple family members   Left Renal Clear Cell Carcinoma: -04/17/2005: Left partial nephrectomy, stage T1 clear-cell renal cell carcinoma. [place]hman nuclear grade 3 of 4. Margins free to tumor - 04/21/2018: MRI  of abdomen showed old left upper pole renal lesion suspicious for recurrent renal cell carcinoma. - 05/20/2018: Left renal mass biopsy and cryoablation-pathology consistent with clear-cell renal cell carcinoma - CT CAP 10/30/2022: NED  Colon cancer: (mostly per documentation- limited records) - 08/30/2005: Colonoscopy: Distal sigmoid colon biopsy: Adenocarcinoma - 10/07/2005: [facility] Path review: Loss of expression of MLH1.  MSI high - Colon cancer x3, s/p subtotal colectomy with ileorectal anastomosis by Dr. Cathlyn Samples at [facility] on 12/11/2005 - No adjuvant chemotherapy   Follicular lymphoma (per documentation- limited records): - Well-differentiated with CD20 positivity, grade 1 found at the time of sigmoid colon resection in 2007 with 2 involved lymph nodes.   Current Treatment:  Pembrolizumab  200mg  every 3 weeks  INTERVAL HISTORY:   Nicole [name] is here today for follow up.    Seham notes that she has been tired the last few days but feels well today. She notes having diarrheal symptoms but are well-controlled with medication.  She has no other complaints today.  She denies abdominal pain, nausea and vomiting.  No weight loss and appetite is stable.  I have reviewed the past medical history, past surgical history, social history and family history with the patient and they are unchanged from previous note.  ALLERGIES:  is allergic to latex, morphine and codeine, and tape.  MEDICATIONS:  Current Outpatient Medications  Medication Sig Dispense Refill   cyanocobalamin  (VITAMIN B12) 1000 MCG/ML injection Inject 1,000 mcg into the muscle every 21 ( twenty-one) days.     lidocaine -prilocaine  (EMLA ) cream Apply to affected area once 30 g 3   ondansetron  (ZOFRAN ) 8 MG tablet Take 1 tablet (8 mg total) by mouth every 8 (eight) hours as needed for nausea or vomiting. 30 tablet 1   oxyCODONE  (OXY IR/ROXICODONE ) 5 MG immediate release tablet Take 1 tablet (5 mg total) by mouth every 6  (six) hours as needed. 10 tablet 0   pantoprazole  (PROTONIX ) 40 MG tablet Take 1 tablet (40 mg total) by mouth daily. 30 tablet 3   Potassium Citrate  15 MEQ (1620 MG) TBCR Take 1,620 tablets by mouth in the morning, at noon, and at bedtime.     prochlorperazine  (COMPAZINE ) 10 MG tablet TAKE ONE TABLET BY MOUTH EVERY 6 HOURS AS NEEDED FOR NAUSEA AND VOMITING 30 tablet 1   No current facility-administered medications for this visit.    REVIEW OF SYSTEMS:   Constitutional: Denies fevers, chills or abnormal weight loss Eyes: Denies blurriness of vision Ears, nose, mouth, throat, and face: Denies mucositis or sore throat Respiratory: Denies cough, dyspnea or wheezes Cardiovascular: Denies palpitation, chest discomfort or lower extremity swelling Gastrointestinal:  Denies nausea, heartburn or change in bowel habits Skin: Denies abnormal skin rashes Lymphatics: Denies new lymphadenopathy or easy bruising Neurological:Denies numbness, tingling or new weaknesses Behavioral/Psych: Mood is stable, no new changes  All other systems were reviewed with the patient and are negative.   VITALS:  There were no vitals taken for this visit.  Wt Readings from Last 3 Encounters:  12/22/23 182 lb (82.6 kg)  12/01/23 183 lb 1.6 oz (83.1 kg)  11/10/23 184 lb (83.5 kg)    There is no height or weight on file to calculate BMI.  Performance status (ECOG): 1 - Symptomatic but completely ambulatory  PHYSICAL EXAM:   GENERAL:alert, no distress and comfortable SKIN: skin color, texture, turgor are normal, no rashes or significant lesions LYMPH:  no palpable lymphadenopathy in the cervical, axillary or inguinal LUNGS: clear to auscultation and percussion with normal breathing effort HEART: regular rate & rhythm and no murmurs and no lower extremity edema ABDOMEN:abdomen soft, non-tender and normal bowel sounds Musculoskeletal:no cyanosis of digits and no clubbing  NEURO:  alert & oriented x 3 with fluent  speech  LABORATORY DATA:  I have reviewed the data as listed    Component Value Date/Time   NA 141 12/22/2023 1121   K 4.6 12/22/2023 1121   CL 104 12/22/2023 1121   CO2 24 12/22/2023 1121   GLUCOSE 134 (H) 12/22/2023 1121   BUN 14 12/22/2023 1121   CREATININE 0.87 12/22/2023 1121   CREATININE 0.89 07/29/2023 0936   CALCIUM 9.5 12/22/2023 1121   PROT 7.1 12/22/2023 1121   ALBUMIN 4.2 12/22/2023 1121   AST 48 (H) 12/22/2023 1121   ALT 45 (H) 12/22/2023 1121   ALKPHOS 256 (H) 12/22/2023 1121   BILITOT 0.3 12/22/2023 1121   GFRNONAA >60 12/22/2023 1121   GFRAA >60 09/16/2019 0951    Lab Results  Component Value Date   WBC 7.5 12/22/2023   NEUTROABS 5.5 12/22/2023   HGB 13.1 12/22/2023   HCT 40.4 12/22/2023   MCV 91.8 12/22/2023   PLT 176 12/22/2023    Latest Reference Range & Units 12/22/23 11:06 12/22/23 11:21  TSH 0.350 - 4.500 uIU/mL 1.250   Thyroxine (T4) 4.5 - 12.0 ug/dL  8.4   PATHOLOGY:  FINAL MICROSCOPIC DIAGNOSIS:   A. LYMPH NODE, RIGHT AXILLARY, EXCISION:  -  Follicular lymphoma, low-grade   Note: The lymph node consists of a infiltrate of variably sized nodules  consisting predominantly of small cleaved/centrocyte like cells with a  relative paucity of large noncleaved/centroblast-like cells.  Focally  there is evidence of extracapsular infiltration.  The cells of interest  are CD20 positive B cells with a germinal center/follicle phenotype  (CD10/BCL6 positive) with strong aberrant BCL2 staining.  The  proliferation rate by Ki-67 is low (<5%).  CD3/CD5 highlight background  small T cells.  CD21/CD23 highlight a well-preserved follicular  dendritic cell meshwork.  Cyclin D1 is appropriately negative in the  lymphocytes.  These findings are consistent with a low-grade (grade 1 of  3) follicular lymphoma.   RADIOGRAPHIC STUDIES: I have personally reviewed the radiological images as listed and agreed with the findings in the report.  None new to  review

## 2024-01-12 NOTE — Progress Notes (Signed)
 Patient presents today for Keytruda  and follow up visit with Dr. Davonna. Vital signs and lab work within parameters for treatment.   Keytruda  given today per MD orders. Tolerated infusion without adverse affects. Vital signs stable. No complaints at this time. Discharged from clinic ambulatory in stable condition. Alert and oriented x 3. F/U with [facility] as scheduled.

## 2024-01-12 NOTE — Progress Notes (Signed)
 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 She is receiving palliative immunotherapy with keytruda  q21d. Patient is under the care of Dr. Davonna   Met with patient in infusion. She is doing well today. Reports tolerating treatment without NIS. Her appetite is good and eating as normal. Patient drinking protein shake (30g) daily. She is surprised about wt loss, however glad to see a couple of pounds gone.    Medications: reviewed   Labs: reviewed   Anthropometrics: Wt 179 lb 3.7 oz today - trending down   10/6 - 182 lb  9/15 - 183 lb 1.6 oz  8/25 - 184 lb    NUTRITION DIAGNOSIS: Food and nutrition related knowledge deficit improving    INTERVENTION:  Reinforced education on importance of adequate calories and protein to maintain wt/strength Continue daily protein shake    MONITORING, EVALUATION, GOAL: wt trends, intake    NEXT VISIT: To be scheduled as needed

## 2024-01-12 NOTE — Progress Notes (Signed)
 Patient has been examined by Dr. Davonna. Vital signs and labs have been reviewed by MD - ANC, Creatinine, LFTs, hemoglobin, and platelets have been reviewed by M.D. - pt may proceed with treatment.  Primary RN and pharmacy notified.

## 2024-01-13 LAB — CEA: CEA: 3.7 ng/mL (ref 0.0–4.7)

## 2024-01-24 ENCOUNTER — Encounter: Payer: Self-pay | Admitting: Oncology

## 2024-01-27 ENCOUNTER — Telehealth: Payer: Self-pay

## 2024-01-27 NOTE — Telephone Encounter (Signed)
-----   Message from [facility] sent at 01/26/2024  4:50 PM EST ----- Hi  Hyndavi,  Yes we will bring her for a office visit soon.  Pod C triage can you please schedule urgent visit with me or any available APP? Thanks VN ----- Message ----- From: Davonna Siad, MD Sent: 01/24/2024   8:54 PM EST To: Kavitha Nandigam V, MD  Hi Veena,  I hope you are doing great.   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  She recently has been having elevated liver enzymes and alkaline phosphatase.  She has a history of gallstones during the workup of which she was found to have ampullary adenocarcinoma.  She is currently on immunotherapy which can also cause hepatitis and elevated alkaline phosphatase and I would like to rule out other causes prior to blaming immunotherapy for this.  She had a previous gastroenterologist at [facility] and she would like to continue with someone locally.  She is specifically remembers you from the hospital stay and would like to follow-up with you.  Will you be willing to see her and continue further workup for her?  Thank you!  Regards, [name], MD Hematology/Oncology [facility]

## 2024-01-27 NOTE — Telephone Encounter (Signed)
 Spoke with patient. Scheduled an appointment with Alan Coombs, PA-C of Pod B. No openings in Pod C. Patient agrees to this appointment.

## 2024-01-28 ENCOUNTER — Encounter (INDEPENDENT_AMBULATORY_CARE_PROVIDER_SITE_OTHER): Payer: Self-pay | Admitting: Gastroenterology

## 2024-01-29 ENCOUNTER — Ambulatory Visit (HOSPITAL_COMMUNITY)
Admission: RE | Admit: 2024-01-29 | Discharge: 2024-01-29 | Disposition: A | Discharge status: Home / Self Care | Source: Ambulatory Visit | Attending: Oncology | Admitting: Oncology

## 2024-01-29 ENCOUNTER — Other Ambulatory Visit: Payer: Self-pay | Admitting: Oncology

## 2024-01-29 DIAGNOSIS — C241 Malignant neoplasm of ampulla of Vater: Secondary | ICD-10-CM | POA: Diagnosis not present

## 2024-01-29 DIAGNOSIS — K8020 Calculus of gallbladder without cholecystitis without obstruction: Secondary | ICD-10-CM | POA: Diagnosis not present

## 2024-01-29 DIAGNOSIS — R591 Generalized enlarged lymph nodes: Secondary | ICD-10-CM | POA: Diagnosis not present

## 2024-01-29 MED ORDER — FLUDEOXYGLUCOSE F - 18 (FDG) INJECTION
8.7600 | Freq: Once | INTRAVENOUS | Status: AC | PRN
  Administered 2024-01-29: 8.76 via INTRAVENOUS

## 2024-01-29 NOTE — Progress Notes (Unsigned)
 01/30/2024 Nicole [name] [id number] 1951-02-09  Referring provider: [name] [name], FNP Primary GI doctor: Dr. Avram Primary gastroenterologist Dr. 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 and elevated LFTs, started on keytruda  x August Reassuring no jaundice, no AB pain, fever, chills, nausea, vomiting Status post cholecystectomy 10/01/2023 ERCP attempted during hospitalization for elevated LFTs, fever chills but found large periampullary ulcer could not find papula with duodenoscope or gastroscope 7/31 EUS/ERCP at [facility] malignant appearing stricture distal third of CBD hepatic gastrostomy stent metal and plastic stent placed in the left lobe of the liver, choledocholithiasis found into the adjacent stomach she was admitted several days later with adjacent biloma 8/5 repeat EUS ERCP with drainage of biloma and placement of new stent with the existing metal stent, choledocholithiasis found again    Latest Ref Rng & Units 01/12/2024   12:07 PM 12/22/2023   11:21 AM 12/01/2023    9:42 AM  Hepatic Function  Total Protein 6.5 - 8.1 g/dL 6.8  7.1  6.4   Albumin 3.5 - 5.0 g/dL 4.0  4.2  3.3   AST 15 - 41 U/L 53  48  30   ALT 0 - 44 U/L 47  45  30   Alk Phosphatase 38 - 126 U/L 323  256  154   Total Bilirubin 0.0 - 1.2 mg/dL 0.3  0.3  0.7    Platelets 171  INR 10/02/2023 1.1  Oncology requested us  evaluate as patient was having normalization of LFTs 12/01/2023 but has had increase of predominantly alk phos with AST ALT since that time.   Having keytruda  every 3 weeks, first was in August Following with Dr. Dasie and Dr. Lanny, Dr. Davonna No AB pain, nausea, vomiting, yellowing of eyes or skin Possible from Keytruda , rule out failed/migrated/clogged stent/recurrent stricture but no elevated bilirubin,  choleodocholitasis but no pain.  -Recheck LFTS, CPK, CBC -Will get MRCP to evaluate stents/ducts - may need  referral back to [facility] for stent placement pending findings - Er precautions discussed - any further recommendations per Dr. Avram Friend adenocarcinoma/follicular lymphoma PET scan showed duodenal/head of pancreas mass along with multiple lymph nodes in cervical, axillary, supraclavicular, internal mammary, paratracheal and retroperitoneal regions. Normal CEA and CA 19-9 Excisional lymph node biopsy consistent with follicular lymphoma Has been on Keytruda  x 11/03/2023 Pending read of repeat PET scan, had yesterday  History of Lynch syndrome with colon cancer x 2 status post subtotal colectomy with distal sigmoid and rectum in situ Normal CEA Last flex sig 3 years ago, will schedule for repeat flexible sigmoidoscopy We have discussed the risks of bleeding, infection, perforation, medication reactions, and remote risk of death associated with colonoscopy. All questions were answered and the patient acknowledges these risk and wishes to proceed.  Non-Hodgkin's lymphoma  Renal cell carcinoma  B12 deficiency  Patient Care Team: [name] [name], FNP as PCP - General (Family Medicine) Matilda Senior, MD as Consulting Physician (Urology) [facility], [facility]  Lloyd Savannah, MD as Referring Physician (Dermatology) Davonna Siad, MD as Medical Oncologist (Medical Oncology) Celestia Joesph SQUIBB, RN as Oncology Nurse Navigator (Medical Oncology)  HISTORY OF PRESENT ILLNESS: 73 y.o. female with complicated medical history including Lynch syndrome with history of colon cancer 2 separate occasions now status post subtotal colectomy has distal sigmoid rectum in situ, renal cell carcinoma, non-Hodgkin's lymphoma, ampullary adenocarcinoma presents the office with elevated LFTs at request of oncology.  Our inpatient team  saw her in the hospital 10/02/2023 transferred from [facility] due to choledocholithiasis and ampullary adenocarcinoma.  Discussed the use of AI scribe  software for clinical note transcription with the patient, who gave verbal consent to proceed.  History of Present Illness   Nicole [name] is a 73 year old female with Lynch syndrome who presents for follow-up of biliary issues and recent ERCP.  She has a history of Lynch syndrome and was hospitalized in July for cholelithiasis with stones in the common bile duct. During this hospitalization, an ulcer and mass were found at the ampulla. She was transferred to [facility] for an ERCP where stents were placed. It is unclear if all stones were removed.  Her liver function tests showed improvement with AST and ALT levels decreasing to 30 and alkaline phosphatase to 154 by September 15th, but they began to rise again by October 6th. She started Keytruda  in August, with the first dose on August 19th, and has had four doses since. No abdominal pain, jaundice, nausea, vomiting, or changes in urine or stool color. She feels tired but is unsure if it is due to laziness or another cause.  She experienced a transient episode of severe foot pain and numbness, which improved over a couple of days. No muscle aches or weakness. She had a PET scan recently.  She underwent a subtotal colectomy with distal sigmoid resection in the past and has had numerous sigmoidoscopies and colonoscopies. Her last sigmoidoscopy was about three years ago. No swallowing difficulties, fever, or chills, although she experienced chills when starting a new medication.  She reports frequent bowel movements but denies dark or bloody stools. She mentions having some type of neuropathy and requires supportive shoes to manage her symptoms. She lives in [place] and worked at [employer] for 37.5 years.      She  reports that she has never smoked. She has never used smokeless tobacco. She reports that she does not currently use alcohol. She reports that she does not use drugs.  RELEVANT GI HISTORY, IMAGING AND LABS: Results   LABS AST: 30 U/L  (12/01/2023) ALT: 30 U/L (12/01/2023) Alkaline Phosphatase: 154 U/L (12/01/2023)     ERCP 10/16/2023   - The major papilla appeared to have a mass. This was biopsied via EUS, see separate note.    - A single localized severe biliary stricture was found in the lower third of the main bile duct. The  stricture was malignant appearing.  - Choledocholithiasis was found.                         - SEE EUS NOTE for details on 8 mm x 6 cm Viabil                         covered metal stent placed in the left hepatic duct                         (Hepaticogastrostomy). One co-axial 7 Fr x 15 cm                         double pigtail plastic stent was placed into the left                         hepatic duct.                         -  No specimens collected.   10/21/2023 ERCP    - An 8 mm x 6 cm Viabil fully covered metal stent was                         placed into the existing malpositioned covered metal                         stent, so the stents now are long enough, with the                         distal extent of the stents in the left hepatic duct,                         and the proximal extent of the stents in the gastric                         fundus. SEE EUS NOTE for additional details.                         - No specimens collected.   10/21/2023 EUS   - Fatty liver disease.                         - There was dilation in the common bile duct which                         measured up to 18 mm.                         - Multiple stones were visualized endosonographically                         in the common bile duct.                         - There was dilation in the gallbladder.                         - A mass was found in the ampulla. Fine needle biopsy                         performed.                         - Hepaticogastrostomy performed with a 6 cm x 8 mm                         Viabil fully covered metal stent, with a co-axial 7 Fr                         x 15 cm  Zimmon double-pigtail plastic stent. See ERCP                         note for details on pigtail placement and                         cholangiogram.  CBC  Component Value Date/Time   WBC 7.4 01/12/2024 1207   RBC 4.43 01/12/2024 1207   HGB 12.8 01/12/2024 1207   HCT 40.6 01/12/2024 1207   PLT 171 01/12/2024 1207   MCV 91.6 01/12/2024 1207   MCH 28.9 01/12/2024 1207   MCHC 31.5 01/12/2024 1207   RDW 13.8 01/12/2024 1207   LYMPHSABS 0.9 01/12/2024 1207   MONOABS 0.8 01/12/2024 1207   EOSABS 0.3 01/12/2024 1207   BASOSABS 0.1 01/12/2024 1207   Recent Labs    10/04/23 0241 10/05/23 0300 10/07/23 0403 10/08/23 0224 10/17/23 0534 10/28/23 0858 11/10/23 0832 12/01/23 0942 12/22/23 1121 01/12/24 1207  HGB 11.7* 11.0* 11.9* 11.6* 13.0 13.1 12.2 12.4 13.1 12.8    CMP     Component Value Date/Time   NA 136 01/12/2024 1207   K 4.5 01/12/2024 1207   CL 101 01/12/2024 1207   CO2 24 01/12/2024 1207   GLUCOSE 119 (H) 01/12/2024 1207   BUN 15 01/12/2024 1207   CREATININE 0.90 01/12/2024 1207   CREATININE 0.89 07/29/2023 0936   CALCIUM 9.0 01/12/2024 1207   PROT 6.8 01/12/2024 1207   ALBUMIN 4.0 01/12/2024 1207   AST 53 (H) 01/12/2024 1207   ALT 47 (H) 01/12/2024 1207   ALKPHOS 323 (H) 01/12/2024 1207   BILITOT 0.3 01/12/2024 1207   GFRNONAA >60 01/12/2024 1207   GFRAA >60 09/16/2019 0951      Latest Ref Rng & Units 01/12/2024   12:07 PM 12/22/2023   11:21 AM 12/01/2023    9:42 AM  Hepatic Function  Total Protein 6.5 - 8.1 g/dL 6.8  7.1  6.4   Albumin 3.5 - 5.0 g/dL 4.0  4.2  3.3   AST 15 - 41 U/L 53  48  30   ALT 0 - 44 U/L 47  45  30   Alk Phosphatase 38 - 126 U/L 323  256  154   Total Bilirubin 0.0 - 1.2 mg/dL 0.3  0.3  0.7       Current Medications:      Current Outpatient Medications (Analgesics):    oxyCODONE  (OXY IR/ROXICODONE ) 5 MG immediate release tablet, Take 1 tablet (5 mg total) by mouth every 6 (six) hours as needed.  Current Outpatient  Medications (Hematological):    Cyanocobalamin  (VITAMIN B 12 PO), Take by mouth daily.  Current Outpatient Medications (Other):    lidocaine -prilocaine  (EMLA ) cream, Apply to affected area once   loperamide (IMODIUM) 2 MG capsule, Take 1 capsule (2 mg total) by mouth as needed for diarrhea or loose stools.   ondansetron  (ZOFRAN ) 8 MG tablet, Take 1 tablet (8 mg total) by mouth every 8 (eight) hours as needed for nausea or vomiting.   pantoprazole  (PROTONIX ) 40 MG tablet, Take 1 tablet (40 mg total) by mouth daily.   Potassium Citrate  15 MEQ (1620 MG) TBCR, Take 1,620 tablets by mouth in the morning, at noon, and at bedtime.   prochlorperazine  (COMPAZINE ) 10 MG tablet, TAKE ONE TABLET BY MOUTH EVERY 6 HOURS AS NEEDED FOR NAUSEA AND VOMITING  Medical History:  Past Medical History:  Diagnosis Date   Allergy ?   Anemia    Arthritis    shorulder   B12 deficiency    Cancer (HCC) 2001   Chronic diarrhea SECONDARY HX COLON CANCER   Colon cancer (HCC)    History of colon cancer [facility] SYNDROME ---  COLORECTAL CANCER S/P COLECTOMY X3  LAST ONE 2007   NO RECURRENCE   History of colon cancer  05/04/2013   History of kidney infection 08/2013   History of kidney stones    History of MRSA infection 08/2013   left leg   Hx of osteopenia    Hyperlipidemia ?   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   NHL (non-Hodgkin's lymphoma) (HCC) 10/17/2010   MONITORED BY DR PERNELL   Personal history of renal cancer S/P LEFT PARTIAL NEPHRECTOMY 2007--  NO RECURRENCE   DUE TO KIDNEY CANCER   Right shoulder pain S/P ROTATOR CUFF REPAIR   Skin cancer    Allergies:  Allergies  Allergen Reactions   Latex Rash   Morphine And Codeine Other (See Comments)    Hallucinations    Tape Rash    Certain tapes      Surgical History:  She  has a past surgical history that includes Hemicolectomy (2001); Hemicolectomy (2002); kidney resection (2007); Subtotal  colectomy (2007); Flexible sigmoidoscopy (08/29/2011); Colonoscopy w/ polypectomy; LEFT FLANK EXPLORATION W/  PARTIAL LEFT NEPHRECTOMY/ LEFT HILAR LYMPH NODE BX (04-17-2005  DR DAHLSTEDT); Laparoscopic assisted vaginal hysterectomy (1993); RIGHT ROTATOR  CUFF REPAIR/   BICEP REPAIR (10-17-2011  DR SUPPLE); Appendectomy (1993); Cystoscopy with stent placement (02/07/2012); Lithotripsy ([phone]); Flexible sigmoidoscopy (N/A, 09/28/2012); Nephrolithotomy (Left, 06/09/2014); Flexible sigmoidoscopy (N/A, 07/08/2014); Ureteroscopy with holmium laser lithotripsy (Left, 07/14/2014); Holmium laser application (Left, 07/14/2014); Cystoscopy w/ ureteral stent removal (Left, 07/14/2014); Cystoscopy with stent placement (Left, 07/14/2014); Cystoscopy w/ retrogrades (07/14/2014); Flexible sigmoidoscopy (N/A, 08/11/2015); Flexible sigmoidoscopy (N/A, 08/13/2016); IR Radiologist Eval & Mgmt (04/29/2018); Radiology with anesthesia (Left, 05/20/2018); Abdominal hysterectomy; IR Radiologist Eval & Mgmt (10/07/2019); Flexible sigmoidoscopy (N/A, 10/06/2019); IR Radiologist Eval & Mgmt (10/11/2020); Cystoscopy with retrograde pyelogram, ureteroscopy and stent placement (Left, 11/30/2020); Holmium laser application (Left, 11/30/2020); IR Radiologist Eval & Mgmt (11/08/2021); Cystoscopy with retrograde pyelogram, ureteroscopy and stent placement (Left, 06/24/2022); Holmium laser application (Left, 06/24/2022); Eye surgery (?); Colon surgery (2001,2002,2007); Esophagogastroduodenoscopy (N/A, 10/03/2023); Portacath placement (Left, 11/03/2023); and Axillary sentinel node biopsy (Right, 11/03/2023). Family History:  Her family history includes Cancer in her maternal grandfather, mother, and son; Early death in her son.  REVIEW OF SYSTEMS  : All other systems reviewed and negative except where noted in the History of Present Illness.  PHYSICAL EXAM: BP 138/80   Pulse 83   Ht 5' (1.524 m)   Wt 178 lb (80.7 kg)   SpO2 100%   BMI 34.76  kg/m  Physical Exam   GENERAL APPEARANCE: Well nourished, in no apparent distress. HEENT: No cervical lymphadenopathy, unremarkable thyroid , sclerae anicteric, conjunctiva pink. RESPIRATORY: Respiratory effort normal, breath sounds equal bilaterally without rales, rhonchi, or wheezing. CARDIO: Regular rate and rhythm with no murmurs, rubs, or gallops, peripheral pulses intact. ABDOMEN: Soft, non-distended, active bowel sounds in all four quadrants, no tenderness to palpation, no rebound, no mass appreciated, no significant pain in epigastric region. RECTAL: Declines. MUSCULOSKELETAL: Full range of motion, normal gait, without edema. SKIN: Dry, intact without rashes or lesions. No jaundice. NEURO: Alert, oriented, no focal deficits. PSYCH: Cooperative, normal mood and affect.      Alan JONELLE Coombs, PA-C 9:12 AM

## 2024-01-30 ENCOUNTER — Encounter: Payer: Self-pay | Admitting: Physician Assistant

## 2024-01-30 ENCOUNTER — Other Ambulatory Visit

## 2024-01-30 ENCOUNTER — Ambulatory Visit: Admitting: Physician Assistant

## 2024-01-30 VITALS — BP 138/80 | HR 83 | Ht 60.0 in | Wt 178.0 lb

## 2024-01-30 DIAGNOSIS — M6281 Muscle weakness (generalized): Secondary | ICD-10-CM

## 2024-01-30 DIAGNOSIS — Z15068 Genetic susceptibility to other malignant neoplasm of digestive system: Secondary | ICD-10-CM | POA: Diagnosis not present

## 2024-01-30 DIAGNOSIS — C189 Malignant neoplasm of colon, unspecified: Secondary | ICD-10-CM | POA: Diagnosis not present

## 2024-01-30 DIAGNOSIS — C241 Malignant neoplasm of ampulla of Vater: Secondary | ICD-10-CM | POA: Diagnosis not present

## 2024-01-30 DIAGNOSIS — K838 Other specified diseases of biliary tract: Secondary | ICD-10-CM

## 2024-01-30 DIAGNOSIS — R748 Abnormal levels of other serum enzymes: Secondary | ICD-10-CM

## 2024-01-30 DIAGNOSIS — Z1507 Genetic susceptibility to malignant neoplasm of urinary tract: Secondary | ICD-10-CM | POA: Diagnosis not present

## 2024-01-30 DIAGNOSIS — Z15060 Genetic susceptibility to colorectal cancer: Secondary | ICD-10-CM

## 2024-01-30 DIAGNOSIS — K8050 Calculus of bile duct without cholangitis or cholecystitis without obstruction: Secondary | ICD-10-CM

## 2024-01-30 DIAGNOSIS — Z1509 Genetic susceptibility to other malignant neoplasm: Secondary | ICD-10-CM

## 2024-01-30 DIAGNOSIS — C8200 Follicular lymphoma grade I, unspecified site: Secondary | ICD-10-CM | POA: Diagnosis not present

## 2024-01-30 DIAGNOSIS — C170 Malignant neoplasm of duodenum: Secondary | ICD-10-CM | POA: Diagnosis not present

## 2024-01-30 LAB — CBC WITH DIFFERENTIAL/PLATELET
Basophils Absolute: 0.1 K/uL (ref 0.0–0.1)
Basophils Relative: 1.5 % (ref 0.0–3.0)
Eosinophils Absolute: 0.2 K/uL (ref 0.0–0.7)
Eosinophils Relative: 2.9 % (ref 0.0–5.0)
HCT: 38.4 % (ref 36.0–46.0)
Hemoglobin: 12.7 g/dL (ref 12.0–15.0)
Lymphocytes Relative: 12.8 % (ref 12.0–46.0)
Lymphs Abs: 0.8 K/uL (ref 0.7–4.0)
MCHC: 33.2 g/dL (ref 30.0–36.0)
MCV: 87.9 fl (ref 78.0–100.0)
Monocytes Absolute: 0.7 K/uL (ref 0.1–1.0)
Monocytes Relative: 11 % (ref 3.0–12.0)
Neutro Abs: 4.3 K/uL (ref 1.4–7.7)
Neutrophils Relative %: 71.8 % (ref 43.0–77.0)
Platelets: 178 K/uL (ref 150.0–400.0)
RBC: 4.37 Mil/uL (ref 3.87–5.11)
RDW: 14.4 % (ref 11.5–15.5)
WBC: 6 K/uL (ref 4.0–10.5)

## 2024-01-30 LAB — BASIC METABOLIC PANEL WITH GFR
BUN: 21 mg/dL (ref 6–23)
CO2: 28 meq/L (ref 19–32)
Calcium: 9 mg/dL (ref 8.4–10.5)
Chloride: 105 meq/L (ref 96–112)
Creatinine, Ser: 0.95 mg/dL (ref 0.40–1.20)
GFR: 59.03 mL/min — ABNORMAL LOW (ref >60.00)
Glucose, Bld: 103 mg/dL — ABNORMAL HIGH (ref 70–99)
Potassium: 4.6 meq/L (ref 3.5–5.1)
Sodium: 143 meq/L (ref 135–145)

## 2024-01-30 LAB — PROTIME-INR
INR: 1.1 ratio — ABNORMAL HIGH (ref 0.8–1.0)
Prothrombin Time: 11.6 s (ref 9.6–13.1)

## 2024-01-30 LAB — HEPATIC FUNCTION PANEL
ALT: 41 U/L — ABNORMAL HIGH (ref 0–35)
AST: 43 U/L — ABNORMAL HIGH (ref 0–37)
Albumin: 4 g/dL (ref 3.5–5.2)
Alkaline Phosphatase: 316 U/L — ABNORMAL HIGH (ref 39–117)
Bilirubin, Direct: 0.1 mg/dL (ref 0.0–0.3)
Total Bilirubin: 0.4 mg/dL (ref 0.2–1.2)
Total Protein: 6.5 g/dL (ref 6.0–8.3)

## 2024-01-30 LAB — CK: Total CK: 43 U/L (ref 17–177)

## 2024-01-30 NOTE — Patient Instructions (Addendum)
 Your provider has requested that you go to the basement level for lab work before leaving today. Press B on the elevator. The lab is located at the first door on the left as you exit the elevator.  Due to recent changes in healthcare laws, you may see the results of your imaging and laboratory studies on MyChart before your provider has had a chance to review them.  We understand that in some cases there may be results that are confusing or concerning to you. Not all laboratory results come back in the same time frame and the provider may be waiting for multiple results in order to interpret others.  Please give us  48 hours in order for your provider to thoroughly review all the results before contacting the office for clarification of your results.   You have been scheduled for an MRCP at [facility] on Friday, 02/06/24. Your appointment time is 9:30. Please arrive to admitting (at main entrance of the hospital) 30 minutes prior to your appointment time for registration purposes. Please make certain not to have anything to eat or drink 6 hours prior to your test. In addition, if you have any metal in your body, have a pacemaker or defibrillator, please be sure to let your ordering physician know. This test typically takes 45 minutes to 1 hour to complete. Should you need to reschedule, please call [phone] to do so.   Go to the ER if you have any yellowing of the skin/eyes, dark urine, severe AB pain, fever, chills, nausea or vomiting.    You have been scheduled for a flexible sigmoidoscopy. Please follow the written instructions given to you at your visit today.  If you use inhalers (even only as needed), please bring them with you on the day of your procedure.  DO NOT TAKE 7 DAYS PRIOR TO TEST- Trulicity (dulaglutide) Ozempic, Wegovy (semaglutide) Mounjaro, Zepbound (tirzepatide) Bydureon Bcise (exanatide extended release)  DO NOT TAKE 1 DAY PRIOR TO YOUR TEST Rybelsus  (semaglutide) Adlyxin (lixisenatide) Victoza (liraglutide) Byetta (exanatide) ___________________________________________________________________________  Thank you for trusting me with your gastrointestinal care!   Alan Coombs, PA-C  _______________________________________________________  If your blood pressure at your visit was 140/90 or greater, please contact your primary care physician to follow up on this.  _______________________________________________________  If you are age 5 or older, your body mass index should be between 23-30. Your Body mass index is 34.76 kg/m. If this is out of the aforementioned range listed, please consider follow up with your Primary Care Provider.  If you are age 68 or younger, your body mass index should be between 19-25. Your Body mass index is 34.76 kg/m. If this is out of the aformentioned range listed, please consider follow up with your Primary Care Provider.   ________________________________________________________  The [place] providers would like to encourage you to use MYCHART to communicate with providers for non-urgent requests or questions.  Due to long hold times on the telephone, sending your provider a message by [facility] may be a faster and more efficient way to get a response.  Please allow 48 business hours for a response.  Please remember that this is for non-urgent requests.  _______________________________________________________  [facility] is using a team-based approach to care.  Your team is made up of your doctor and two to three APPS. Our APPS (Nurse Practitioners and Physician Assistants) work with your physician to ensure care continuity for you. They are fully qualified to address your health concerns and develop  a treatment plan. They communicate directly with your gastroenterologist to care for you. Seeing the Advanced Practice Practitioners on your physician's team can help you by facilitating care  more promptly, often allowing for earlier appointments, access to diagnostic testing, procedures, and other specialty referrals.

## 2024-02-02 ENCOUNTER — Ambulatory Visit: Payer: Self-pay | Admitting: Physician Assistant

## 2024-02-02 DIAGNOSIS — R748 Abnormal levels of other serum enzymes: Secondary | ICD-10-CM

## 2024-02-05 ENCOUNTER — Inpatient Hospital Stay

## 2024-02-05 ENCOUNTER — Inpatient Hospital Stay (HOSPITAL_BASED_OUTPATIENT_CLINIC_OR_DEPARTMENT_OTHER): Admitting: Oncology

## 2024-02-05 ENCOUNTER — Telehealth (INDEPENDENT_AMBULATORY_CARE_PROVIDER_SITE_OTHER): Payer: Self-pay | Admitting: Gastroenterology

## 2024-02-05 ENCOUNTER — Inpatient Hospital Stay: Attending: Oncology

## 2024-02-05 VITALS — BP 136/61 | HR 62 | Temp 97.0°F | Resp 18

## 2024-02-05 VITALS — BP 131/59

## 2024-02-05 DIAGNOSIS — Z90722 Acquired absence of ovaries, bilateral: Secondary | ICD-10-CM | POA: Insufficient documentation

## 2024-02-05 DIAGNOSIS — Z1507 Genetic susceptibility to malignant neoplasm of urinary tract: Secondary | ICD-10-CM | POA: Diagnosis not present

## 2024-02-05 DIAGNOSIS — R7989 Other specified abnormal findings of blood chemistry: Secondary | ICD-10-CM | POA: Insufficient documentation

## 2024-02-05 DIAGNOSIS — Z5112 Encounter for antineoplastic immunotherapy: Secondary | ICD-10-CM | POA: Diagnosis not present

## 2024-02-05 DIAGNOSIS — Z15060 Genetic susceptibility to colorectal cancer: Secondary | ICD-10-CM

## 2024-02-05 DIAGNOSIS — Z85828 Personal history of other malignant neoplasm of skin: Secondary | ICD-10-CM | POA: Diagnosis not present

## 2024-02-05 DIAGNOSIS — R197 Diarrhea, unspecified: Secondary | ICD-10-CM | POA: Insufficient documentation

## 2024-02-05 DIAGNOSIS — Z1509 Genetic susceptibility to other malignant neoplasm: Secondary | ICD-10-CM | POA: Diagnosis not present

## 2024-02-05 DIAGNOSIS — Z90710 Acquired absence of both cervix and uterus: Secondary | ICD-10-CM | POA: Diagnosis not present

## 2024-02-05 DIAGNOSIS — C241 Malignant neoplasm of ampulla of Vater: Secondary | ICD-10-CM

## 2024-02-05 DIAGNOSIS — K8020 Calculus of gallbladder without cholecystitis without obstruction: Secondary | ICD-10-CM | POA: Diagnosis not present

## 2024-02-05 DIAGNOSIS — Z85528 Personal history of other malignant neoplasm of kidney: Secondary | ICD-10-CM | POA: Diagnosis not present

## 2024-02-05 DIAGNOSIS — R748 Abnormal levels of other serum enzymes: Secondary | ICD-10-CM | POA: Insufficient documentation

## 2024-02-05 DIAGNOSIS — C189 Malignant neoplasm of colon, unspecified: Secondary | ICD-10-CM

## 2024-02-05 DIAGNOSIS — Z15068 Genetic susceptibility to other malignant neoplasm of digestive system: Secondary | ICD-10-CM

## 2024-02-05 DIAGNOSIS — C642 Malignant neoplasm of left kidney, except renal pelvis: Secondary | ICD-10-CM | POA: Diagnosis not present

## 2024-02-05 DIAGNOSIS — K219 Gastro-esophageal reflux disease without esophagitis: Secondary | ICD-10-CM | POA: Insufficient documentation

## 2024-02-05 DIAGNOSIS — Z905 Acquired absence of kidney: Secondary | ICD-10-CM | POA: Insufficient documentation

## 2024-02-05 DIAGNOSIS — Z95828 Presence of other vascular implants and grafts: Secondary | ICD-10-CM

## 2024-02-05 LAB — CBC WITH DIFFERENTIAL/PLATELET
Abs Immature Granulocytes: 0.05 K/uL (ref 0.00–0.07)
Basophils Absolute: 0.1 K/uL (ref 0.0–0.1)
Basophils Relative: 1 %
Eosinophils Absolute: 0.2 K/uL (ref 0.0–0.5)
Eosinophils Relative: 3 %
HCT: 38.2 % (ref 36.0–46.0)
Hemoglobin: 12.3 g/dL (ref 12.0–15.0)
Immature Granulocytes: 1 %
Lymphocytes Relative: 11 %
Lymphs Abs: 0.9 K/uL (ref 0.7–4.0)
MCH: 29.6 pg (ref 26.0–34.0)
MCHC: 32.2 g/dL (ref 30.0–36.0)
MCV: 91.8 fL (ref 80.0–100.0)
Monocytes Absolute: 0.9 K/uL (ref 0.1–1.0)
Monocytes Relative: 11 %
Neutro Abs: 5.8 K/uL (ref 1.7–7.7)
Neutrophils Relative %: 73 %
Platelets: 172 K/uL (ref 150–400)
RBC: 4.16 MIL/uL (ref 3.87–5.11)
RDW: 13.9 % (ref 11.5–15.5)
WBC: 7.9 K/uL (ref 4.0–10.5)
nRBC: 0 % (ref 0.0–0.2)

## 2024-02-05 LAB — COMPREHENSIVE METABOLIC PANEL WITH GFR
ALT: 53 U/L — ABNORMAL HIGH (ref 0–44)
AST: 57 U/L — ABNORMAL HIGH (ref 15–41)
Albumin: 3.9 g/dL (ref 3.5–5.0)
Alkaline Phosphatase: 332 U/L — ABNORMAL HIGH (ref 38–126)
Anion gap: 11 (ref 5–15)
BUN: 26 mg/dL — ABNORMAL HIGH (ref 8–23)
CO2: 25 mmol/L (ref 22–32)
Calcium: 9.2 mg/dL (ref 8.9–10.3)
Chloride: 103 mmol/L (ref 98–111)
Creatinine, Ser: 0.81 mg/dL (ref 0.44–1.00)
GFR, Estimated: >60 mL/min (ref >60)
Glucose, Bld: 102 mg/dL — ABNORMAL HIGH (ref 70–99)
Potassium: 4.4 mmol/L (ref 3.5–5.1)
Sodium: 139 mmol/L (ref 135–145)
Total Bilirubin: 0.3 mg/dL (ref 0.0–1.2)
Total Protein: 6.7 g/dL (ref 6.5–8.1)

## 2024-02-05 LAB — MAGNESIUM: Magnesium: 1.9 mg/dL (ref 1.7–2.4)

## 2024-02-05 MED ORDER — SODIUM CHLORIDE 0.9 % IV SOLN
200.0000 mg | Freq: Once | INTRAVENOUS | Status: AC
  Administered 2024-02-05: 200 mg via INTRAVENOUS
  Filled 2024-02-05: qty 8

## 2024-02-05 MED ORDER — SODIUM CHLORIDE 0.9 % IV SOLN: INTRAVENOUS | Status: DC

## 2024-02-05 NOTE — Patient Instructions (Signed)

## 2024-02-05 NOTE — Telephone Encounter (Signed)
 Noted

## 2024-02-05 NOTE — Progress Notes (Signed)
 Patient Care Team: [name] [name], FNP as PCP - General (Family Medicine) Matilda Senior, MD as Consulting Physician (Urology) [facility], [facility]  Lloyd Savannah, MD as Referring Physician (Dermatology) Davonna Siad, MD as Medical Oncologist (Medical Oncology) Celestia Joesph SQUIBB, RN as Oncology Nurse Navigator (Medical Oncology)  Clinic Day:  02/05/2024  Referring physician: [name] [name], FNP   CHIEF COMPLAINT:  CC: Ampullary Adenocarcinoma/ Lynch syndrome   ASSESSMENT & PLAN:   Assessment & Plan: Nicole [name]  is a 73 y.o. 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 Dasie and is reluctant to assess this considering her son had this procedure when he was very young and had complications from it Biopsy of duodenal ulcer consistent with adenocarcinoma PET scan showed duodenal/head of pancreas mass along with multiple lymph nodes in cervical, axillary, supraclavicular, internal mammary, paratracheal and retroperitoneal regions. Normal CEA and CA 19-9 Excisional lymph node biopsy consistent with follicular lymphoma   -C5D1 of pembrolizumab . Tolerated treatment well. -We reviewed the recent PET scan findings together.  No significant change in ill-defined mild hypermetabolic activity in the region of the ampulla, likely corresponding to the patient's known ampullary adenocarcinoma.  No significant change in the prominent lymph nodes.  No evidence of progressive metastatic disease. -Continue Pembrolizumab  200mg  every 3 weeks - Labs reviewed today: CMP: Normal creatinine, AST: 57, ALT: 53, alk phos: 332-elevated from prior.  CBC: WNL, TSH and T4 WNL -Reemphasized common side effects detected with pembrolizumab  includes rash, diarrhea, thyroid  abnormalities, possible pneumonitis and hepatitis. -Discussed that  goal of immunotherapy would be to see if there will be resolution of carcinoma so that patient can avoid Whipple procedure. - Pending genetic evaluation   RTC in 9 weeks to assess for tolerance   Malignant neoplasm of colon, unspecified part Patient with a history of colon cancer diagnosed in 2007 reportedly 3 times s/p hemicolectomy with ileocecal anastomosis Was undergoing regular surveillance for Lynch syndrome.  Patient did not have follow-up recently.      Lynch Syndrome Patient has MLH1 loss of expression, MSI high consistent with Lynch syndrome   - Patient was undergoing frequent surveillance for Lynch syndrome - Reportedly had genetic evaluation done at [facility] in 2007.  Will refer to genetic counseling at this time as I could not find the report   Gastric reflux Continue pantoprazole  40 mg daily    Elevated liver enzymes and alkaline phosphatase Patient has a history of gallstones and would like to follow-up with GI   -I will reach out to Dr. Nandigam for follow-up.  Elevated liver enzymes Liver enzymes slightly elevated, stable. Alkaline phosphatase increasing. MRCP scheduled for further evaluation. - Proceed with MRCP to evaluate liver status.  Diarrhea Intermittent diarrhea likely dietary-related. No associated rash, abdominal pain, or dyspnea. - Advised taking Imodium after every loose stool, up to five times a day.  The patient understands the plans discussed today and is in agreement with them.  She knows to contact our office if she develops concerns prior to her next appointment.  The total time spent in the appointment was 20 minutes for the encounter with patient, including review of chart and various tests results, discussions about plan of care and coordination of care plan   [name], MD  [facility] - A DEPT OF [facility]  [address] [place] [address] Dept:  [phone] Dept Fax: [phone]   No orders of the defined types were placed in this encounter.    ONCOLOGY HISTORY:    Diagnosis: Ampullary Adenocarcinoma   -09/23/2023: abdominal Ultrasound: Cholelithiasis with no acute cholecytitis. Dilated common bile duct 1.7cm with mild intrahepatic biliary dilatation. -10/01/2023: Presented to the ER with Intermittent fevers and epigastric pain - 10/01/2023 MRCP: Distended gallbladder containing numerous gallstones. Severe intra and extrahepatic biliary ductal dilatation, the common bile duct measuring up to 1.9 cm in caliber and containing multiple small gallstones dependently. The duct however appears to taper to the ampulla without calculus of the ampulla. Findings are highly suspicious for ampullary stricture or perhaps an occult mass. Enlarged retroperitoneal lymph nodes unchanged when compared to examination dated 10/30/2022, left retroperitoneal nodes measuring up to 1.7 x 1.2 cm. - 10/03/2023 ERCP:  Duodenal ulcer 25 mm in its largest dimension that was biopsied. Pathology of duodenal ulcer: Ulcer with glandular atypia suspicious for adenocarcinoma. - 10/04/2023 CT AP: There is some thickening in the area of the pancreatic head with a small cystic area and narrowing of the distal common duct at the level of the ampulla with some mucosal thickening. Recommend further evaluation to exclude an ampullary lesion when clinically appropriate. Stable multifocal atrophy of the left kidney with distortion and stones. Stable numerous enlarged lymph nodes in the retroperitoneum and retrocrural spaces going back to August 2024.  - 10/04/2023 CEA: 2.4, CA 19-9: 13  - 10/16/2023 EUS/ERCP with biopsy and stent placement.  -Pathology of ampulla of Vater mass biopsy: Invasive adenocarcinoma, moderately differentiated with mucinous features, MMR deficient.  -Comments: Immunostains for mismatch repair (MMR) protein expression are performed (A1).  -MSI-H: There is  loss of nuclear expression of MLH1 and PMS2, and intact nuclear expression of MSH2, and MSH6 within the tumor cells.  - 10/17/2023 CT AP: Suspected tumor within the descending duodenum, ampulla, or head of pancreas. The lesion is ill-defined, and the margins are difficult to measure. Abdominal adenopathy, unchanged from the previous exam. Interval placement of a transbiliary biliary-to-gastric drainage catheter with multiple gallstones and mild gallbladder wall thickening. Interval improvement in fusiform dilatation of the common bile duct. Multiple common bile duct stones are again seen.  - 10/23/2023: PET CT: Multiple tracer avid lymph nodes in the cervical, axillary, supraclavicular, internal mammary, paratracheal, and retroperitoneal regions. Suspicious for nodal metastasis. If tissue confirmation as clinically indicated ultrasound-guided percutaneous needle biopsy of the supraclavicular or axillary lymph nodes may be considered. Mild radiotracer activity in the region of the descending duodenum/head of pancreas with SUV max of 4.3. this corresponds to the location of the known malignancy. Transbiliary biliary-to-gastric drainage catheter with multiple gallstones and mild gallbladder wall thickening. Pneumobilia is identified. Increased uptake within the left lobe of liver along the catheter track is likely inflammatory in nature. -11/03/2023: Right axillary LN excisional biopsy:  - Follicular lymphoma, low-grade  -11/03/2023: Port placed -11/10/2023-Current: Single agent Pembrolizumab  (200mg  every 3 weeks) -11/12/2023: [facility] NGS: MSI-high, d-MMR, TMB: High, 43 mut/Mb  - ERHGAP35, ARID1A, CBC73, CDK12, EPHA2, FNCD2, FBXW7, FOXA1, HNF1A,KMT2A,KMT2D,MLH1,MRE11,PRKDC,RBM10,SOX9,TRAF7: pathogenic variants present -BRAF,NTRK1/2/3,RET,ALK,APC,BRCA1/2,HER2/ERBB2,FGFR2,KRAS,NF1,NRG1,PD-L1,PIK3CA,ROS1,TP53 : Negative -01/29/2024: PET scan: No significant change in ill-defined mild hypermetabolic activity in the  region of the ampulla, likely corresponding with the patient's known ampullary adenocarcinoma. No significant change in prominent lymph nodes in the neck, chest and abdomen with low level hypermetabolic activity, nonspecific. The distribution of the nodes and stability favor reactive nodes or a low-grade lymphoproliferative process. No  evidence of progressive metastatic disease.      Lynch syndrome:   - MLH1 loss of expression - Multiple colon cancer s/p colectomy - TAH and BSO in 1993 - Left renal cell carcinoma - Follicular lymphoma -Multiple skin cancer status post Mohs procedure - Extensive family history of multiple cancers in multiple family members   Left Renal Clear Cell Carcinoma:  -04/17/2005: Left partial nephrectomy, stage T1 clear-cell renal cell carcinoma. Furhman nuclear grade 3 of 4. Margins free to tumor - 04/21/2018: MRI of abdomen showed old left upper pole renal lesion suspicious for recurrent renal cell carcinoma. - 05/20/2018: Left renal mass biopsy and cryoablation-pathology consistent with clear-cell renal cell carcinoma - CT CAP 10/30/2022: NED   Colon cancer: (mostly per documentation- limited records)   - 08/30/2005: Colonoscopy: Distal sigmoid colon biopsy: Adenocarcinoma - 10/07/2005: [facility] Path review: Loss of expression of MLH1.  MSI high - Colon cancer x3, s/p subtotal colectomy with ileorectal anastomosis by Dr. Cathlyn Samples at [facility] on 12/11/2005 - No adjuvant chemotherapy   Follicular lymphoma (per documentation- limited records):   - Well-differentiated with CD20 positivity, grade 1 found at the time of sigmoid colon resection in 2007 with 2 involved lymph nodes.   Current Treatment:  Pembrolizumab   INTERVAL HISTORY:  Discussed the use of AI scribe software for clinical note transcription with the patient, who gave verbal consent to proceed.  History of Present Illness Nicole [name] is a 73 year old female with Lynch syndrome and recent  ampullary adenocarcinoma presents for follow-up and monitoring of her condition.  She has completed four cycles of Keytruda .  Her liver enzymes are slightly elevated but stable, and her alkaline phosphatase levels have been rising slightly.  She is scheduled for an MRCP soon.  She experiences diarrhea, which varies with her diet. No rash, abdominal pain, or shortness of breath.    I have reviewed the past medical history, past surgical history, social history and family history with the patient and they are unchanged from previous note.  ALLERGIES:  is allergic to latex, morphine and codeine, and tape.  MEDICATIONS:  Current Outpatient Medications  Medication Sig Dispense Refill   Cyanocobalamin  (VITAMIN B 12 PO) Take by mouth daily.     lidocaine -prilocaine  (EMLA ) cream Apply to affected area once 30 g 3   loperamide (IMODIUM) 2 MG capsule Take 1 capsule (2 mg total) by mouth as needed for diarrhea or loose stools. 30 capsule 2   ondansetron  (ZOFRAN ) 8 MG tablet Take 1 tablet (8 mg total) by mouth every 8 (eight) hours as needed for nausea or vomiting. 30 tablet 1   oxyCODONE  (OXY IR/ROXICODONE ) 5 MG immediate release tablet Take 1 tablet (5 mg total) by mouth every 6 (six) hours as needed. 10 tablet 0   pantoprazole  (PROTONIX ) 40 MG tablet Take 1 tablet (40 mg total) by mouth daily. 30 tablet 3   Potassium Citrate  15 MEQ (1620 MG) TBCR Take 1,620 tablets by mouth in the morning, at noon, and at bedtime.     prochlorperazine  (COMPAZINE ) 10 MG tablet TAKE ONE TABLET BY MOUTH EVERY 6 HOURS AS NEEDED FOR NAUSEA AND VOMITING 30 tablet 1   No current facility-administered medications for this visit.    REVIEW OF SYSTEMS:   Constitutional: Denies fevers, chills or abnormal weight loss Eyes: Denies blurriness of vision Ears, nose, mouth, throat, and face: Denies mucositis or sore throat Respiratory: Denies cough, dyspnea or wheezes Cardiovascular: Denies palpitation, chest discomfort or  lower extremity  swelling Gastrointestinal:  Denies nausea, heartburn or change in bowel habits Skin: Denies abnormal skin rashes Lymphatics: Denies new lymphadenopathy or easy bruising Neurological:Denies numbness, tingling or new weaknesses Behavioral/Psych: Mood is stable, no new changes  All other systems were reviewed with the patient and are negative.   VITALS:  There were no vitals taken for this visit.  Wt Readings from Last 3 Encounters:  01/30/24 178 lb (80.7 kg)  01/12/24 179 lb 3.7 oz (81.3 kg)  12/22/23 182 lb (82.6 kg)    There is no height or weight on file to calculate BMI.  Performance status (ECOG): 1 - Symptomatic but completely ambulatory  PHYSICAL EXAM:   GENERAL:alert, no distress and comfortable SKIN: skin color, texture, turgor are normal, no rashes or significant lesions LYMPH:  no palpable lymphadenopathy in the cervical, axillary or inguinal LUNGS: clear to auscultation and percussion with normal breathing effort HEART: regular rate & rhythm and no murmurs and no lower extremity edema ABDOMEN:abdomen soft, non-tender and normal bowel sounds Musculoskeletal:no cyanosis of digits and no clubbing  NEURO: alert & oriented x 3 with fluent speech   LABORATORY DATA:  I have reviewed the data as listed    Component Value Date/Time   NA 143 01/30/2024 0950   K 4.6 01/30/2024 0950   CL 105 01/30/2024 0950   CO2 28 01/30/2024 0950   GLUCOSE 103 (H) 01/30/2024 0950   BUN 21 01/30/2024 0950   CREATININE 0.95 01/30/2024 0950   CREATININE 0.89 07/29/2023 0936   CALCIUM 9.0 01/30/2024 0950   PROT 6.5 01/30/2024 0950   ALBUMIN 4.0 01/30/2024 0950   AST 43 (H) 01/30/2024 0950   ALT 41 (H) 01/30/2024 0950   ALKPHOS 316 (H) 01/30/2024 0950   BILITOT 0.4 01/30/2024 0950   GFRNONAA >60 01/12/2024 1207   GFRAA >60 09/16/2019 0951    Lab Results  Component Value Date   WBC 6.0 01/30/2024   NEUTROABS 4.3 01/30/2024   HGB 12.7 01/30/2024   HCT 38.4  01/30/2024   MCV 87.9 01/30/2024   PLT 178.0 01/30/2024    Latest Reference Range & Units 12/22/23 11:21 01/12/24 12:07  TSH 0.350 - 4.500 uIU/mL  1.050  Thyroxine (T4) 4.5 - 12.0 ug/dL 8.4   CEA 0.0 - 4.7 ng/mL  3.7   PATHOLOGY:  FINAL MICROSCOPIC DIAGNOSIS:   A. LYMPH NODE, RIGHT AXILLARY, EXCISION:  -  Follicular lymphoma, low-grade   Note: The lymph node consists of a infiltrate of variably sized nodules  consisting predominantly of small cleaved/centrocyte like cells with a  relative paucity of large noncleaved/centroblast-like cells.  Focally  there is evidence of extracapsular infiltration.  The cells of interest  are CD20 positive B cells with a germinal center/follicle phenotype  (CD10/BCL6 positive) with strong aberrant BCL2 staining.  The  proliferation rate by Ki-67 is low (<5%).  CD3/CD5 highlight background  small T cells.  CD21/CD23 highlight a well-preserved follicular  dendritic cell meshwork.  Cyclin D1 is appropriately negative in the  lymphocytes.  These findings are consistent with a low-grade (grade 1 of  3) follicular lymphoma.   RADIOGRAPHIC STUDIES: I have personally reviewed the radiological images as listed and agreed with the findings in the report.  NM PET Image Restag (PS) Skull Base To Thigh CLINICAL DATA:  Subsequent treatment strategy for ampullary adenocarcinoma.  EXAM: NUCLEAR MEDICINE PET SKULL BASE TO THIGH  TECHNIQUE: 8.76 mCi F-18 FDG was injected intravenously. Full-ring PET imaging was performed from the skull base to thigh after  the radiotracer. CT data was obtained and used for attenuation correction and anatomic localization.  Fasting blood glucose: 101 mg/dl  COMPARISON:  PET-CT 91/92/7974. Abdominopelvic CT 10/17/2023. Chest CT 10/07/2023.  FINDINGS: Mediastinal blood pool activity: SUV max 2.5  NECK:  Again demonstrated are multiple small cervical lymph nodes bilaterally with low level metabolic activity. There are  2 adjacent posterior cervical lymph nodes on the left measuring up to 1.1 cm on image 24/202 (SUV max 4.1). No progressive adenopathy identified. No suspicious activity identified within the pharyngeal mucosal space.  Incidental CT findings: Asymmetric left carotid atherosclerosis.  CHEST:  Again demonstrated are mildly enlarged axillary lymph nodes bilaterally with low level hypermetabolic activity. 1.2 cm right axillary node on image 52/202 has an SUV max of 3.5. A 1.2 cm left axillary node on image 49/202 has an SUV max of 3.9. No enlarged or hypermetabolic mediastinal or hilar lymph nodes identified. There are stable small retrocrural lymph nodes (SUV max 3.9 on the left). No hypermetabolic pulmonary activity or suspicious nodularity.  Incidental CT findings: Stable prominent left internal mammary nodes, without hypermetabolic activity. Stable atelectasis or scarring in both lung bases. No significant pleural or pericardial effusion. A left subclavian Port-A-Cath terminates in the left brachiocephalic vein.  ABDOMEN/PELVIS:  Ill-defined hypermetabolic activity is again noted in the region of the ampulla (SUV max 5.1, previously 4.3). No well-defined corresponding mass demonstrated in this region. Nonspecific low-level activity within the distal aspect of the stomach adjacent postsurgical changes (SUV max 6.7). There is no hypermetabolic activity within the liver, adrenal glands, spleen or pancreas. No significant change in prominent retroperitoneal lymph nodes with low level hypermetabolic activity. There is a left periaortic node measuring 1.5 cm on image 89/202 (SUV max 4.5, previously 3.9). No progressive adenopathy identified.  Incidental CT findings: There is a stent extending from the hepatic hilum into the gastric lumen. Postsurgical changes within and surrounding the stomach. Calcified gallstones without evidence of gallbladder wall thickening or biliary dilatation.  There is chronic scarring and cortical thinning of the left kidney with associated nonobstructing calculi. Previous subtotal colectomy.  SKELETON:  There is no hypermetabolic activity to suggest osseous metastatic disease.  Incidental CT findings: none  IMPRESSION: 1. No significant change in ill-defined mild hypermetabolic activity in the region of the ampulla, likely corresponding with the patient's known ampullary adenocarcinoma. Abdominal involvement may be better addressed with follow-up MRI if clinically warranted. 2. No significant change in prominent lymph nodes in the neck, chest and abdomen with low level hypermetabolic activity, nonspecific. The distribution of the nodes and stability favor reactive nodes or a low-grade lymphoproliferative process. Correlate clinically. 3. No evidence of progressive metastatic disease. 4. Nonspecific low-level activity within the distal stomach adjacent to postsurgical changes. 5. Cholelithiasis. 6. Chronic scarring and cortical thinning of the left kidney.  Electronically Signed   By: Elsie Perone M.D.   On: 01/31/2024 17:02

## 2024-02-05 NOTE — Telephone Encounter (Signed)
 Patient received a recall letter from us  and said she was following Dr Avram at [facility] and didn't need to follow up with us . Just an FYI

## 2024-02-05 NOTE — Progress Notes (Signed)
 Patient has been examined by Dr. Davonna. Vital signs and labs have been reviewed by MD - ANC, Creatinine, LFTs, hemoglobin, and platelets have been reviewed by M.D. - pt may proceed with treatment.  Primary RN and pharmacy notified.

## 2024-02-05 NOTE — Progress Notes (Signed)
 Patient's port flushed without difficulty.  No blood return noted. No pain, swelling, discomfort, or redness while flushing. Pt has received two rounds of alteplase  since she has started treatment. MD made aware. Per MD to schedule pt a dye study, no alteplase  today, and to proceed with treatment through port. Pt updated and agrees to plan. Treatment team made aware.   Nicole [name]

## 2024-02-05 NOTE — Patient Instructions (Signed)
 [facility] [place] - A DEPT OF [facility]  Discharge Instructions: Thank you for choosing [facility] to provide your oncology and hematology care.  If you have a lab appointment with the Cancer Center - please note that after April 8th, 2024, all labs will be drawn in the cancer center.  You do not have to check in or register with the main entrance as you have in the past but will complete your check-in in the cancer center.  Wear comfortable clothing and clothing appropriate for easy access to any Portacath or PICC line.   We strive to give you quality time with your provider. You may need to reschedule your appointment if you arrive late (15 or more minutes).  Arriving late affects you and other patients whose appointments are after yours.  Also, if you miss three or more appointments without notifying the office, you may be dismissed from the clinic at the provider's discretion.      For prescription refill requests, have your pharmacy contact our office and allow 72 hours for refills to be completed.    Today you received the following chemotherapy and/or immunotherapy agents Keytruda      To help prevent nausea and vomiting after your treatment, we encourage you to take your nausea medication as directed.  BELOW ARE SYMPTOMS THAT SHOULD BE REPORTED IMMEDIATELY: *FEVER GREATER THAN 100.4 F (38 C) OR HIGHER *CHILLS OR SWEATING *NAUSEA AND VOMITING THAT IS NOT CONTROLLED WITH YOUR NAUSEA MEDICATION *UNUSUAL SHORTNESS OF BREATH *UNUSUAL BRUISING OR BLEEDING *URINARY PROBLEMS (pain or burning when urinating, or frequent urination) *BOWEL PROBLEMS (unusual diarrhea, constipation, pain near the anus) TENDERNESS IN MOUTH AND THROAT WITH OR WITHOUT PRESENCE OF ULCERS (sore throat, sores in mouth, or a toothache) UNUSUAL RASH, SWELLING OR PAIN  UNUSUAL VAGINAL DISCHARGE OR ITCHING   Items with * indicate a potential emergency and should be followed up  as soon as possible or go to the Emergency Department if any problems should occur.  Please show the CHEMOTHERAPY ALERT CARD or IMMUNOTHERAPY ALERT CARD at check-in to the Emergency Department and triage nurse.  Should you have questions after your visit or need to cancel or reschedule your appointment, please contact [facility]  - A DEPT OF [facility] [phone]  and follow the prompts.  Office hours are 8:00 a.m. to 4:30 p.m. Monday - Friday. Please note that voicemails left after 4:00 p.m. may not be returned until the following business day.  We are closed weekends and major holidays. You have access to a nurse at all times for urgent questions. Please call the main number to the clinic [phone] and follow the prompts.  For any non-urgent questions, you may also contact your provider using MyChart. We now offer e-Visits for anyone 30 and older to request care online for non-urgent symptoms. For details visit [email or web address].   Also download the MyChart app! Go to the app store, search "MyChart", open the app, select [place], and log in with your MyChart username and password.

## 2024-02-05 NOTE — Progress Notes (Signed)
 Patient presents today for Keytruda  infusion per providers order.  Vital signs and labs reviewed by MD.  Patients PORT not giving blood, MD notified, patient will go for a dye study on 11/21.  Message received from Isaiah Piety RN/Dr. Davonna patient okay for treatment and okay to use the patients PORT today for Keytruda  infusion.    Treatment given today per MD orders.  Stable during infusion without adverse affects.  Vital signs stable.  No complaints at this time.  Discharge from clinic ambulatory in stable condition.  Alert and oriented X 3.  Follow up with [facility] as scheduled.

## 2024-02-06 ENCOUNTER — Ambulatory Visit (HOSPITAL_COMMUNITY)
Admission: RE | Admit: 2024-02-06 | Discharge: 2024-02-06 | Disposition: A | Discharge status: Home / Self Care | Source: Ambulatory Visit | Attending: Physician Assistant | Admitting: Physician Assistant

## 2024-02-06 ENCOUNTER — Ambulatory Visit (HOSPITAL_COMMUNITY)
Admission: RE | Admit: 2024-02-06 | Discharge: 2024-02-06 | Disposition: A | Discharge status: Home / Self Care | Source: Ambulatory Visit | Attending: Oncology | Admitting: Oncology

## 2024-02-06 ENCOUNTER — Other Ambulatory Visit: Payer: Self-pay | Admitting: Physician Assistant

## 2024-02-06 DIAGNOSIS — R748 Abnormal levels of other serum enzymes: Secondary | ICD-10-CM | POA: Insufficient documentation

## 2024-02-06 DIAGNOSIS — C241 Malignant neoplasm of ampulla of Vater: Secondary | ICD-10-CM | POA: Diagnosis present

## 2024-02-06 DIAGNOSIS — K838 Other specified diseases of biliary tract: Secondary | ICD-10-CM | POA: Insufficient documentation

## 2024-02-06 DIAGNOSIS — K8050 Calculus of bile duct without cholangitis or cholecystitis without obstruction: Secondary | ICD-10-CM | POA: Diagnosis not present

## 2024-02-06 DIAGNOSIS — Z95828 Presence of other vascular implants and grafts: Secondary | ICD-10-CM | POA: Insufficient documentation

## 2024-02-06 DIAGNOSIS — C8590 Non-Hodgkin lymphoma, unspecified, unspecified site: Secondary | ICD-10-CM | POA: Diagnosis not present

## 2024-02-06 DIAGNOSIS — C649 Malignant neoplasm of unspecified kidney, except renal pelvis: Secondary | ICD-10-CM | POA: Diagnosis not present

## 2024-02-06 DIAGNOSIS — C189 Malignant neoplasm of colon, unspecified: Secondary | ICD-10-CM | POA: Diagnosis not present

## 2024-02-06 LAB — CEA: CEA: 3.4 ng/mL (ref 0.0–4.7)

## 2024-02-06 MED ORDER — IOHEXOL 180 MG/ML  SOLN
10.0000 mL | Freq: Once | INTRAMUSCULAR | Status: AC | PRN
  Administered 2024-02-06: 10 mL

## 2024-02-06 MED ORDER — HEPARIN SOD (PORK) LOCK FLUSH 100 UNIT/ML IV SOLN
INTRAVENOUS | Status: AC
  Filled 2024-02-06: qty 5

## 2024-02-06 MED ORDER — GADOBUTROL 1 MMOL/ML IV SOLN
7.5000 mL | Freq: Once | INTRAVENOUS | Status: AC | PRN
  Administered 2024-02-06: 7.5 mL via INTRAVENOUS

## 2024-02-16 ENCOUNTER — Other Ambulatory Visit

## 2024-02-16 DIAGNOSIS — R748 Abnormal levels of other serum enzymes: Secondary | ICD-10-CM

## 2024-02-17 LAB — HEPATIC FUNCTION PANEL
ALT: 35 U/L (ref 0–35)
AST: 30 U/L (ref 0–37)
Albumin: 4.2 g/dL (ref 3.5–5.2)
Alkaline Phosphatase: 252 U/L — ABNORMAL HIGH (ref 39–117)
Bilirubin, Direct: 0.1 mg/dL (ref 0.0–0.3)
Total Bilirubin: 0.4 mg/dL (ref 0.2–1.2)
Total Protein: 7.3 g/dL (ref 6.0–8.3)

## 2024-02-18 ENCOUNTER — Ambulatory Visit: Payer: Self-pay | Admitting: Physician Assistant

## 2024-02-19 ENCOUNTER — Telehealth: Payer: Self-pay | Admitting: *Deleted

## 2024-02-19 NOTE — Telephone Encounter (Signed)
 Pt reports 3 bouts of watery loose stools over the last 5 days. Reports that the diarrhea is relieved by Imodium . Denies any other issues and reports she is feeling just fine. Instructed if the diarrhea persists, to please call the clinic tomorrow to let us  know. [name] understanding of the above.

## 2024-02-24 ENCOUNTER — Ambulatory Visit: Admitting: Internal Medicine

## 2024-02-24 ENCOUNTER — Encounter: Payer: Self-pay | Admitting: Internal Medicine

## 2024-02-24 VITALS — BP 164/68 | HR 88 | Temp 97.9°F | Resp 17 | Ht 60.0 in | Wt 178.0 lb

## 2024-02-24 DIAGNOSIS — K648 Other hemorrhoids: Secondary | ICD-10-CM | POA: Diagnosis not present

## 2024-02-24 DIAGNOSIS — Z1211 Encounter for screening for malignant neoplasm of colon: Secondary | ICD-10-CM | POA: Diagnosis not present

## 2024-02-24 DIAGNOSIS — Z9049 Acquired absence of other specified parts of digestive tract: Secondary | ICD-10-CM | POA: Diagnosis not present

## 2024-02-24 DIAGNOSIS — Z15068 Genetic susceptibility to other malignant neoplasm of digestive system: Secondary | ICD-10-CM

## 2024-02-24 DIAGNOSIS — Z980 Intestinal bypass and anastomosis status: Secondary | ICD-10-CM | POA: Diagnosis not present

## 2024-02-24 DIAGNOSIS — Z1509 Genetic susceptibility to other malignant neoplasm: Secondary | ICD-10-CM | POA: Diagnosis not present

## 2024-02-24 MED ORDER — SODIUM CHLORIDE 0.9 % IV SOLN: 500.0000 mL | INTRAVENOUS | Status: AC

## 2024-02-24 NOTE — Progress Notes (Signed)
 Transferred to PACU via stretcher. A&O x 3.

## 2024-02-24 NOTE — Patient Instructions (Addendum)
 No polyps seen.  Per guidelines you should repeat 1 of these in about 1 year.  Good luck with your treatment for ampullary cancer.  I will be available as needed in the interim.  I appreciate the opportunity to care for you. Nicole CHARLENA Commander, MD, [facility]   Discharge instructions given. Handout on Hemorrhoids. Resume previous medications. YOU HAD AN ENDOSCOPIC PROCEDURE TODAY AT THE [facility]:   Refer to the procedure report that was given to you for any specific questions about what was found during the examination.  If the procedure report does not answer your questions, please call your gastroenterologist to clarify.  If you requested that your care partner not be given the details of your procedure findings, then the procedure report has been included in a sealed envelope for you to review at your convenience later.  YOU SHOULD EXPECT: Some feelings of bloating in the abdomen. Passage of more gas than usual.  Walking can help get rid of the air that was put into your GI tract during the procedure and reduce the bloating. If you had a lower endoscopy (such as a colonoscopy or flexible sigmoidoscopy) you may notice spotting of blood in your stool or on the toilet paper. If you underwent a bowel prep for your procedure, you may not have a normal bowel movement for a few days.  Please Note:  You might notice some irritation and congestion in your nose or some drainage.  This is from the oxygen used during your procedure.  There is no need for concern and it should clear up in a day or so.  SYMPTOMS TO REPORT IMMEDIATELY:  Following lower endoscopy (colonoscopy or flexible sigmoidoscopy):  Excessive amounts of blood in the stool  Significant tenderness or worsening of abdominal pains  Swelling of the abdomen that is new, acute  Fever of 100F or higher   For urgent or emergent issues, a gastroenterologist can be reached at any hour by calling [phone]. Do not use MyChart  messaging for urgent concerns.    DIET:  We do recommend a small meal at first, but then you may proceed to your regular diet.  Drink plenty of fluids but you should avoid alcoholic beverages for 24 hours.  ACTIVITY:  You should plan to take it easy for the rest of today and you should NOT DRIVE or use heavy machinery until tomorrow (because of the sedation medicines used during the test).    FOLLOW UP: Our staff will call the number listed on your records the next business day following your procedure.  We will call around 7:15- 8:00 am to check on you and address any questions or concerns that you may have regarding the information given to you following your procedure. If we do not reach you, we will leave a message.     If any biopsies were taken you will be contacted by phone or by letter within the next 1-3 weeks.  Please call us  at [phone] if you have not heard about the biopsies in 3 weeks.    SIGNATURES/CONFIDENTIALITY: You and/or your care partner have signed paperwork which will be entered into your electronic medical record.  These signatures attest to the fact that that the information above on your After Visit Summary has been reviewed and is understood.  Full responsibility of the confidentiality of this discharge information lies with you and/or your care-partner.

## 2024-02-24 NOTE — Progress Notes (Signed)
 History and Physical Interval Note:  02/24/2024 9:30 AM  Nicole [name]  has presented today for endoscopic procedure(s), with the diagnosis of  Encounter Diagnosis  Name Primary?   Lynch syndrome Yes  .  The various methods of evaluation and treatment have been discussed with the patient and/or family. After consideration of risks, benefits and other options for treatment, the patient has consented to  the endoscopic procedure(s).   The patient's history has been reviewed, patient examined, no change in status, stable for endoscopic procedure(s).  I have reviewed the patient's chart and labs.  Questions were answered to the patient's satisfaction.     [name], MD, NOLIA

## 2024-02-24 NOTE — Op Note (Signed)
 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 Medicines:                None Procedure:                Pre-Anesthesia Assessment:                           - Prior to the procedure, a History and Physical                            was performed, and patient medications and                            allergies were reviewed. The patient's tolerance of                            previous anesthesia was also reviewed. The risks                            and benefits of the procedure and the sedation                            options and risks were discussed with the patient.                            All questions were answered, and informed consent                            was obtained. Prior Anticoagulants: The patient has                            taken no anticoagulant or antiplatelet agents. ASA                            Grade Assessment: II - A patient with mild systemic                            disease. After reviewing the risks and benefits,  the patient was deemed in satisfactory condition to                            undergo the procedure.                           After obtaining informed consent, the scope was                            passed under direct vision. The [id number]-[id number]                            Colonoscope [id number] was introduced through the anus                            and advanced to the the ileo-sigmoid  anastomosis.                            The flexible sigmoidoscopy was accomplished without                            difficulty. The patient tolerated the procedure                            well. The quality of the bowel preparation was good. Scope In: 9:39:33 AM Scope Out: 9:43:54 AM Total Procedure Duration: 0 hours 4 minutes 21 seconds  Findings:                 The perianal and digital rectal examinations were                            normal.                           The recto-sigmoid colon, sigmoid colon and                            anastomosis appeared normal.                           Internal hemorrhoids were found during retroflexion. Complications:            No immediate complications. Estimated Blood Loss:     Estimated blood loss: none. Impression:               - The sigmoid colon, colonic anastomosis and                            recto-sigmoid colon are normal.                           - Internal hemorrhoids in the rectum.                           - No specimens collected. No polyps. Recommendation:           - Patient has a contact number available  for                            emergencies. The signs and symptoms of potential                            delayed complications were discussed with the                            patient. Return to normal activities tomorrow.                            Written discharge instructions were provided to the                            patient.                           - Resume previous diet.                           - Repeat flexible sigmoidoscopy in 1 year. Lupita FORBES Commander, MD 02/24/2024 9:56:48 AM This report has been signed electronically.

## 2024-02-25 ENCOUNTER — Telehealth: Payer: Self-pay

## 2024-02-25 NOTE — Telephone Encounter (Signed)
 Unable to reach pt.  Left HIPAA compliant voicemail.

## 2024-02-26 ENCOUNTER — Inpatient Hospital Stay

## 2024-02-26 ENCOUNTER — Inpatient Hospital Stay: Attending: Oncology

## 2024-02-26 ENCOUNTER — Telehealth: Payer: Self-pay | Admitting: *Deleted

## 2024-02-26 VITALS — BP 137/72 | HR 64 | Temp 98.4°F | Resp 18

## 2024-02-26 DIAGNOSIS — Z79620 Long term (current) use of immunosuppressive biologic: Secondary | ICD-10-CM | POA: Insufficient documentation

## 2024-02-26 DIAGNOSIS — C170 Malignant neoplasm of duodenum: Secondary | ICD-10-CM | POA: Diagnosis not present

## 2024-02-26 DIAGNOSIS — Z5112 Encounter for antineoplastic immunotherapy: Secondary | ICD-10-CM | POA: Insufficient documentation

## 2024-02-26 DIAGNOSIS — C241 Malignant neoplasm of ampulla of Vater: Secondary | ICD-10-CM

## 2024-02-26 LAB — CBC WITH DIFFERENTIAL/PLATELET
Abs Immature Granulocytes: 0.04 K/uL (ref 0.00–0.07)
Basophils Absolute: 0.1 K/uL (ref 0.0–0.1)
Basophils Relative: 1 %
Eosinophils Absolute: 0.3 K/uL (ref 0.0–0.5)
Eosinophils Relative: 3 %
HCT: 40 % (ref 36.0–46.0)
Hemoglobin: 12.5 g/dL (ref 12.0–15.0)
Immature Granulocytes: 0 %
Lymphocytes Relative: 10 %
Lymphs Abs: 1 K/uL (ref 0.7–4.0)
MCH: 29.1 pg (ref 26.0–34.0)
MCHC: 31.3 g/dL (ref 30.0–36.0)
MCV: 93 fL (ref 80.0–100.0)
Monocytes Absolute: 0.9 K/uL (ref 0.1–1.0)
Monocytes Relative: 10 %
Neutro Abs: 7.1 K/uL (ref 1.7–7.7)
Neutrophils Relative %: 76 %
Platelets: 190 K/uL (ref 150–400)
RBC: 4.3 MIL/uL (ref 3.87–5.11)
RDW: 14.1 % (ref 11.5–15.5)
WBC: 9.3 K/uL (ref 4.0–10.5)
nRBC: 0 % (ref 0.0–0.2)

## 2024-02-26 LAB — COMPREHENSIVE METABOLIC PANEL WITH GFR
ALT: 27 U/L (ref 0–44)
AST: 38 U/L (ref 15–41)
Albumin: 4.1 g/dL (ref 3.5–5.0)
Alkaline Phosphatase: 283 U/L — ABNORMAL HIGH (ref 38–126)
Anion gap: 13 (ref 5–15)
BUN: 16 mg/dL (ref 8–23)
CO2: 22 mmol/L (ref 22–32)
Calcium: 9.3 mg/dL (ref 8.9–10.3)
Chloride: 108 mmol/L (ref 98–111)
Creatinine, Ser: 0.8 mg/dL (ref 0.44–1.00)
GFR, Estimated: >60 mL/min (ref >60)
Glucose, Bld: 103 mg/dL — ABNORMAL HIGH (ref 70–99)
Potassium: 5.3 mmol/L — ABNORMAL HIGH (ref 3.5–5.1)
Sodium: 143 mmol/L (ref 135–145)
Total Bilirubin: 0.3 mg/dL (ref 0.0–1.2)
Total Protein: 6.8 g/dL (ref 6.5–8.1)

## 2024-02-26 LAB — TSH: TSH: 1.16 u[IU]/mL (ref 0.350–4.500)

## 2024-02-26 LAB — MAGNESIUM: Magnesium: 2.1 mg/dL (ref 1.7–2.4)

## 2024-02-26 MED ORDER — SODIUM CHLORIDE 0.9 % IV SOLN
200.0000 mg | Freq: Once | INTRAVENOUS | Status: AC
  Administered 2024-02-26: 200 mg via INTRAVENOUS
  Filled 2024-02-26: qty 8

## 2024-02-26 MED ORDER — SODIUM CHLORIDE 0.9 % IV SOLN: INTRAVENOUS | Status: DC

## 2024-02-26 NOTE — Progress Notes (Signed)
 Patient's dye study was performed and completed on 02/06/2024. Results viewed and discussed with NP and treatment team. Per NP okay to proceed with treatment peripherally. Per scheduling they will contact patient of when port revision is scheduled. Pt updated and all questions answered at time.      Nicole [name]

## 2024-02-26 NOTE — Progress Notes (Signed)
 Patient presents today for chemotherapy Keytruda  infusion.  Patient is in satisfactory condition with no new complaints voiced.  Vital signs are stable.  Labs reviewed and all labs are within treatment parameters.  We will proceed with treatment per MD orders.    Patient's potassium noted to be 5.3 today. Delon Hope, NP/Carol Joshua PharmD made aware and stated for patient to hold potassium citrate  for 10 days and return for labs to re-check potassium. Patient made aware and advised to call the physician that ordered the potassium citrate  and make him aware of potassium levels and seek advisory. Patient verbalized understanding of the above.  Peripheral IV started with good blood return pre and post infusion.  Treatment given today per MD orders. Tolerated infusion without adverse affects. Vital signs stable. No complaints at this time. Discharged from clinic ambulatory in stable condition. Alert and oriented x 3. F/U with [facility] as scheduled.

## 2024-02-26 NOTE — Patient Instructions (Signed)
 [facility] [place] - A DEPT OF [facility]  Discharge Instructions: Thank you for choosing [facility] to provide your oncology and hematology care.  If you have a lab appointment with the Cancer Center - please note that after April 8th, 2024, all labs will be drawn in the cancer center.  You do not have to check in or register with the main entrance as you have in the past but will complete your check-in in the cancer center.  Wear comfortable clothing and clothing appropriate for easy access to any Portacath or PICC line.   We strive to give you quality time with your provider. You may need to reschedule your appointment if you arrive late (15 or more minutes).  Arriving late affects you and other patients whose appointments are after yours.  Also, if you miss three or more appointments without notifying the office, you may be dismissed from the clinic at the provider's discretion.      For prescription refill requests, have your pharmacy contact our office and allow 72 hours for refills to be completed.    Today you received the following chemotherapy and/or immunotherapy agents Keytruda   To help prevent nausea and vomiting after your treatment, we encourage you to take your nausea medication as directed.  Pembrolizumab Injection What is this medication? PEMBROLIZUMAB (PEM broe LIZ ue mab) treats some types of cancer. It works by helping your immune system slow or stop the spread of cancer cells. It is a monoclonal antibody. This medicine may be used for other purposes; ask your health care provider or pharmacist if you have questions. COMMON BRAND NAME(S): Keytruda What should I tell my care team before I take this medication? They need to know if you have any of these conditions: Allogeneic stem cell transplant (uses someone else's stem cells) Autoimmune diseases, such as Crohn disease, ulcerative colitis, lupus History of chest radiation Nervous  system problems, such as Guillain-Barre syndrome, myasthenia gravis Organ transplant An unusual or allergic reaction to pembrolizumab, other medications, foods, dyes, or preservatives Pregnant or trying to get pregnant Breast-feeding How should I use this medication? This medication is injected into a vein. It is given by your care team in a hospital or clinic setting. A special MedGuide will be given to you before each treatment. Be sure to read this information carefully each time. Talk to your care team about the use of this medication in children. While it may be prescribed for children as young as 6 months for selected conditions, precautions do apply. Overdosage: If you think you have taken too much of this medicine contact a poison control center or emergency room at once. NOTE: This medicine is only for you. Do not share this medicine with others. What if I miss a dose? Keep appointments for follow-up doses. It is important not to miss your dose. Call your care team if you are unable to keep an appointment. What may interact with this medication? Interactions have not been studied. This list may not describe all possible interactions. Give your health care provider a list of all the medicines, herbs, non-prescription drugs, or dietary supplements you use. Also tell them if you smoke, drink alcohol, or use illegal drugs. Some items may interact with your medicine. What should I watch for while using this medication? Your condition will be monitored carefully while you are receiving this medication. You may need blood work while taking this medication. This medication may cause  serious skin reactions. They can happen weeks to months after starting the medication. Contact your care team right away if you notice fevers or flu-like symptoms with a rash. The rash may be red or purple and then turn into blisters or peeling of the skin. You may also notice a red rash with swelling of the face,  lips, or lymph nodes in your neck or under your arms. Tell your care team right away if you have any change in your eyesight. Talk to your care team if you may be pregnant. Serious birth defects can occur if you take this medication during pregnancy and for 4 months after the last dose. You will need a negative pregnancy test before starting this medication. Contraception is recommended while taking this medication and for 4 months after the last dose. Your care team can help you find the option that works for you. Do not breastfeed while taking this medication and for 4 months after the last dose. What side effects may I notice from receiving this medication? Side effects that you should report to your care team as soon as possible: Allergic reactions--skin rash, itching, hives, swelling of the face, lips, tongue, or throat Dry cough, shortness of breath or trouble breathing Eye pain, redness, irritation, or discharge with blurry or decreased vision Heart muscle inflammation--unusual weakness or fatigue, shortness of breath, chest pain, fast or irregular heartbeat, dizziness, swelling of the ankles, feet, or hands Hormone gland problems--headache, sensitivity to light, unusual weakness or fatigue, dizziness, fast or irregular heartbeat, increased sensitivity to cold or heat, excessive sweating, constipation, hair loss, increased thirst or amount of urine, tremors or shaking, irritability Infusion reactions--chest pain, shortness of breath or trouble breathing, feeling faint or lightheaded Kidney injury (glomerulonephritis)--decrease in the amount of urine, red or dark brown urine, foamy or bubbly urine, swelling of the ankles, hands, or feet Liver injury--right upper belly pain, loss of appetite, nausea, light-colored stool, dark yellow or brown urine, yellowing skin or eyes, unusual weakness or fatigue Pain, tingling, or numbness in the hands or feet, muscle weakness, change in vision, confusion or  trouble speaking, loss of balance or coordination, trouble walking, seizures Rash, fever, and swollen lymph nodes Redness, blistering, peeling, or loosening of the skin, including inside the mouth Sudden or severe stomach pain, bloody diarrhea, fever, nausea, vomiting Side effects that usually do not require medical attention (report to your care team if they continue or are bothersome): Bone, joint, or muscle pain Diarrhea Fatigue Loss of appetite Nausea Skin rash This list may not describe all possible side effects. Call your doctor for medical advice about side effects. You may report side effects to FDA at 1-800-FDA-1088. Where should I keep my medication? This medication is given in a hospital or clinic. It will not be stored at home. NOTE: This sheet is a summary. It may not cover all possible information. If you have questions about this medicine, talk to your doctor, pharmacist, or health care provider.  2024 Elsevier/Gold Standard (2021-07-17 00:00:00)  BELOW ARE SYMPTOMS THAT SHOULD BE REPORTED IMMEDIATELY: *FEVER GREATER THAN 100.4 F (38 C) OR HIGHER *CHILLS OR SWEATING *NAUSEA AND VOMITING THAT IS NOT CONTROLLED WITH YOUR NAUSEA MEDICATION *UNUSUAL SHORTNESS OF BREATH *UNUSUAL BRUISING OR BLEEDING *URINARY PROBLEMS (pain or burning when urinating, or frequent urination) *BOWEL PROBLEMS (unusual diarrhea, constipation, pain near the anus) TENDERNESS IN MOUTH AND THROAT WITH OR WITHOUT PRESENCE OF ULCERS (sore throat, sores in mouth, or a toothache) UNUSUAL RASH, SWELLING OR PAIN  UNUSUAL VAGINAL DISCHARGE OR ITCHING   Items with * indicate a potential emergency and should be followed up as soon as possible or go to the Emergency Department if any problems should occur.  Please show the CHEMOTHERAPY ALERT CARD or IMMUNOTHERAPY ALERT CARD at check-in to the Emergency Department and triage nurse.  Should you have questions after your visit or need to cancel or reschedule  your appointment, please contact [facility] [place] - A DEPT OF [facility] [phone]  and follow the prompts.  Office hours are 8:00 a.m. to 4:30 p.m. Monday - Friday. Please note that voicemails left after 4:00 p.m. may not be returned until the following business day.  We are closed weekends and major holidays. You have access to a nurse at all times for urgent questions. Please call the main number to the clinic [phone] and follow the prompts.  For any non-urgent questions, you may also contact your provider using MyChart. We now offer e-Visits for anyone 43 and older to request care online for non-urgent symptoms. For details visit [email or web address].   Also download the MyChart app! Go to the app store, search "MyChart", open the app, select [place], and log in with your MyChart username and password.

## 2024-02-26 NOTE — Telephone Encounter (Signed)
 Left subclavian Port-A-Cath is malpositioned. The catheter tip has retracted into the left innominate vein. Catheter tip is either against the vein wall or there is a fibrin sheath. Recommend port revision.   Patient will require revision of port.   Dr. Mavis aware and agreeable to procedure.

## 2024-02-27 LAB — CEA: CEA: 3.1 ng/mL (ref 0.0–4.7)

## 2024-02-27 LAB — T4: T4, Total: 8.1 ug/dL (ref 4.5–12.0)

## 2024-02-27 NOTE — H&P (Signed)
 Nicole [name] is an 73 y.o. female.   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  The left sided port catheter tip is now retracted into the left innominate vein.  It is difficult to draw back blood.  A fibrinous sheath or the tip long against the vein wall is suspected.  Past Medical History:  Diagnosis Date   Allergy ?   Anemia    Arthritis    shorulder   B12 deficiency    Cancer (HCC) 2001   Chronic diarrhea SECONDARY HX COLON CANCER   Colon cancer (HCC)    History of colon cancer [facility] SYNDROME ---  COLORECTAL CANCER S/P COLECTOMY X3  LAST ONE 2007   NO RECURRENCE   History of colon cancer 05/04/2013   History of kidney infection 08/2013   History of kidney stones    History of MRSA infection 08/2013   left leg   Hx of osteopenia    Hyperlipidemia ?   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   NHL (non-Hodgkin's lymphoma) (HCC) 10/17/2010   MONITORED BY DR PERNELL   Personal history of renal cancer S/P LEFT PARTIAL NEPHRECTOMY 2007--  NO RECURRENCE   DUE TO KIDNEY CANCER   Right shoulder pain S/P ROTATOR CUFF REPAIR   Skin cancer     Past Surgical History:  Procedure Laterality Date   ABDOMINAL HYSTERECTOMY     APPENDECTOMY  1993   W/ LAVH   AXILLARY SENTINEL NODE BIOPSY Right 11/03/2023   Procedure: BIOPSY, LYMPH NODE, SENTINEL, AXILLARY;  Surgeon: Mavis Anes, MD;  Location: [facility];  Service: General;  Laterality: Right;   COLON SURGERY  2001,2002,2007   COLONOSCOPY W/ POLYPECTOMY     CYSTOSCOPY W/ RETROGRADES  07/14/2014   Procedure: CYSTOSCOPY WITH RETROGRADE PYELOGRAM;  Surgeon: Garnette Shack, MD;  Location: [facility];  Service: Urology;;   CYSTOSCOPY W/ URETERAL STENT REMOVAL Left 07/14/2014   Procedure: CYSTOSCOPY WITH STENT  REMOVAL;  Surgeon: Garnette Shack, MD;  Location: [facility];  Service: Urology;  Laterality: Left;   CYSTOSCOPY WITH RETROGRADE PYELOGRAM, URETEROSCOPY AND STENT PLACEMENT Left 11/30/2020   Procedure: CYSTOSCOPY WITH RETROGRADE PYELOGRAM, URETEROSCOPY/STONE EXTRACTION AND STENT PLACEMENT;  Surgeon: Shack Garnette, MD;  Location: [facility];  Service: Urology;  Laterality: Left;   CYSTOSCOPY WITH RETROGRADE PYELOGRAM, URETEROSCOPY AND STENT PLACEMENT Left 06/24/2022   Procedure: CYSTOSCOPY WITH RETROGRADE PYELOGRAM, URETEROSCOPY AND STENT PLACEMENT;  Surgeon: Shack Garnette, MD;  Location: [facility];  Service: Urology;  Laterality: Left;   CYSTOSCOPY WITH STENT PLACEMENT  02/07/2012   Procedure: CYSTOSCOPY WITH STENT PLACEMENT;  Surgeon: Garnette Shack, MD;  Location: [facility];  Service: Urology;  Laterality: Left;   CYSTOSCOPY WITH STENT PLACEMENT Left 07/14/2014   Procedure: CYSTOSCOPY WITH STENT PLACEMENT;  Surgeon: Garnette Shack, MD;  Location: [facility];  Service: Urology;  Laterality: Left;   ESOPHAGOGASTRODUODENOSCOPY N/A 10/03/2023   Procedure: EGD (ESOPHAGOGASTRODUODENOSCOPY);  Surgeon: Avram Lupita BRAVO, MD;  Location: [facility];  Service: Gastroenterology;  Laterality: N/A;  ercp scheduled, but unable to be done due to large ulcer   EYE SURGERY  ?   Removal of styes   FLEXIBLE SIGMOIDOSCOPY  08/29/2011   Procedure: FLEXIBLE SIGMOIDOSCOPY;  Surgeon: Claudis RAYMOND Rivet, MD;  [facility];  Service: Endoscopy;  Laterality: N/A;  930   FLEXIBLE SIGMOIDOSCOPY N/A 09/28/2012   Procedure: FLEXIBLE SIGMOIDOSCOPY;  Surgeon: Claudis RAYMOND Rivet, MD;  [facility];  Service: Endoscopy;  Laterality: N/A;  730   FLEXIBLE SIGMOIDOSCOPY N/A 07/08/2014   Procedure: FLEXIBLE SIGMOIDOSCOPY;  Surgeon: Claudis RAYMOND Rivet, MD;  [facility];  Service: Endoscopy;  Laterality: N/A;  830  - moved to 10:20 - Ann to notify pt   FLEXIBLE SIGMOIDOSCOPY N/A 08/11/2015   Procedure: FLEXIBLE SIGMOIDOSCOPY;  Surgeon: Claudis RAYMOND Rivet, MD;  [facility];  Service: Endoscopy;  Laterality: N/A;  730   FLEXIBLE SIGMOIDOSCOPY N/A 08/13/2016   Procedure: FLEXIBLE SIGMOIDOSCOPY;  Surgeon: Rivet Claudis RAYMOND, MD;  [facility];  Service: Endoscopy;  Laterality: N/A;  730   FLEXIBLE SIGMOIDOSCOPY N/A 10/06/2019   Procedure: FLEXIBLE SIGMOIDOSCOPY;  Surgeon: Rivet Claudis RAYMOND, MD;  [facility];  Service: Endoscopy;  Laterality: N/A;  1245   HEMICOLECTOMY  2001   right   HEMICOLECTOMY  2002   left   HOLMIUM LASER APPLICATION Left 07/14/2014   Procedure: HOLMIUM LASER LITHOTRIPSY,;  Surgeon: Garnette Shack, MD;  Location: [facility];  Service: Urology;  Laterality: Left;   HOLMIUM LASER APPLICATION Left 11/30/2020   Procedure: HOLMIUM LASER APPLICATION;  Surgeon: Shack Garnette, MD;  Location: [facility];  Service: Urology;  Laterality: Left;   HOLMIUM LASER APPLICATION Left 06/24/2022   Procedure: HOLMIUM LASER APPLICATION;  Surgeon: Shack Garnette, MD;  Location: [facility];  Service: Urology;  Laterality: Left;   IR RADIOLOGIST EVAL & MGMT  04/29/2018   IR RADIOLOGIST EVAL & MGMT  10/07/2019   IR RADIOLOGIST EVAL & MGMT  10/11/2020   IR RADIOLOGIST EVAL & MGMT  11/08/2021   kidney resection  2007   left partial   LAPAROSCOPIC ASSISTED VAGINAL HYSTERECTOMY  1993   W/ BILATERAL SALPINGO-OOPHORECTOMY   LEFT FLANK EXPLORATION W/  PARTIAL LEFT NEPHRECTOMY/ LEFT HILAR LYMPH NODE BX  04-17-2005  DR DAHLSTEDT   RENAL CELL CARCINOMA   LITHOTRIPSY  11-22013   x2   NEPHROLITHOTOMY Left 06/09/2014   Procedure: NEPHROLITHOTOMY PERCUTANEOUS;  Surgeon: Garnette Shack, MD;  Location: [facility];  Service: Urology;  Laterality: Left;  with STENT   PORTACATH PLACEMENT Left 11/03/2023   Procedure: INSERTION, TUNNELED  CENTRAL VENOUS DEVICE, WITH PORT;  Surgeon: Mavis Anes, MD;  Location: [facility];  Service: General;  Laterality: Left;   RADIOLOGY WITH ANESTHESIA Left 05/20/2018   Procedure: CT WITH ANESTHESIA LEFT RENAL CRYOABLATION AND BIOPSY;  Surgeon: Vanice Sharper, MD;  Location: [facility];  Service: Anesthesiology;  Laterality: Left;   RIGHT ROTATOR  CUFF REPAIR/   BICEP REPAIR  10-17-2011  DR SUPPLE   SUBTOTAL COLECTOMY  2007   URETEROSCOPY WITH HOLMIUM LASER LITHOTRIPSY Left 07/14/2014   Procedure: URETEROSCOPY  EXTRACTION OF STONES;  Surgeon: Garnette Shack, MD;  Location: [facility];  Service: Urology;  Laterality: Left;    Family History  Problem Relation Age of Onset   Cancer Son    Early death Son    Cancer Mother    Cancer Maternal Grandfather    Social History:  reports that she has never smoked. She has never used smokeless tobacco. She reports that she does not currently use alcohol. She reports that she does not use drugs.  Allergies: Allergies[1]  No medications prior to admission.    Results for orders placed or performed in visit on 02/26/24 (  from the past 48 hours)  Magnesium      Status: None   Collection Time: 02/26/24  9:00 AM  Result Value Ref Range   Magnesium  2.1 1.7 - 2.4 mg/dL    Comment: Performed at [facility], [address], [place], [place] [address]  CBC with Differential     Status: None   Collection Time: 02/26/24  9:00 AM  Result Value Ref Range   WBC 9.3 4.0 - 10.5 K/uL   RBC 4.30 3.87 - 5.11 MIL/uL   Hemoglobin 12.5 12.0 - 15.0 g/dL   HCT 59.9 63.9 - 53.9 %   MCV 93.0 80.0 - 100.0 fL   MCH 29.1 26.0 - 34.0 pg   MCHC 31.3 30.0 - 36.0 g/dL   RDW 85.8 88.4 - 84.4 %   Platelets 190 150 - 400 K/uL   nRBC 0.0 0.0 - 0.2 %   Neutrophils Relative % 76 %   Neutro Abs 7.1 1.7 - 7.7 K/uL   Lymphocytes Relative 10 %   Lymphs Abs 1.0 0.7 - 4.0 K/uL   Monocytes Relative 10 %   Monocytes Absolute 0.9 0.1 - 1.0 K/uL   Eosinophils Relative 3 %    Eosinophils Absolute 0.3 0.0 - 0.5 K/uL   Basophils Relative 1 %   Basophils Absolute 0.1 0.0 - 0.1 K/uL   Immature Granulocytes 0 %   Abs Immature Granulocytes 0.04 0.00 - 0.07 K/uL    Comment: Performed at [facility], [address] [place], [place] [address]  Comprehensive metabolic panel     Status: Abnormal   Collection Time: 02/26/24  9:00 AM  Result Value Ref Range   Sodium 143 135 - 145 mmol/L   Potassium 5.3 (H) 3.5 - 5.1 mmol/L   Chloride 108 98 - 111 mmol/L   CO2 22 22 - 32 mmol/L   Glucose, Bld 103 (H) 70 - 99 mg/dL    Comment: Glucose reference range applies only to samples taken after fasting for at least 8 hours.   BUN 16 8 - 23 mg/dL   Creatinine, Ser 9.19 0.44 - 1.00 mg/dL   Calcium 9.3 8.9 - 89.6 mg/dL   Total Protein 6.8 6.5 - 8.1 g/dL   Albumin 4.1 3.5 - 5.0 g/dL   AST 38 15 - 41 U/L   ALT 27 0 - 44 U/L   Alkaline Phosphatase 283 (H) 38 - 126 U/L   Total Bilirubin 0.3 0.0 - 1.2 mg/dL   GFR, Estimated >39 >39 mL/min    Comment: (NOTE) Calculated using the CKD-EPI Creatinine Equation (2021)    Anion gap 13 5 - 15    Comment: Performed at [facility], [address] [place], [place] [address]  TSH     Status: None   Collection Time: 02/26/24  9:00 AM  Result Value Ref Range   TSH 1.160 0.350 - 4.500 uIU/mL    Comment: Performed at [facility], [address] [place], [place] [address]   No results found.  Review of Systems  Constitutional:  Positive for fatigue.  HENT: Negative.    Respiratory: Negative.    Gastrointestinal:        Heartburn  All other systems reviewed and are negative.   There were no vitals taken for this visit. Physical Exam Vitals reviewed.  Constitutional:      Appearance: Normal appearance. She is normal weight. She is not ill-appearing.  HENT:     Head: Normocephalic and atraumatic.  Cardiovascular:  Rate and Rhythm: Normal rate and regular rhythm.     Heart sounds: Normal heart sounds. No murmur heard.    No  friction rub. No gallop.  Pulmonary:     Effort: Pulmonary effort is normal. No respiratory distress.     Breath sounds: Normal breath sounds. No stridor. No wheezing, rhonchi or rales.     Comments: Port-A-Cath in place left upper chest Skin:    General: Skin is warm and dry.  Neurological:     Mental Status: She is alert and oriented to person, place, and time.   Contrast study of port reviewed  Assessment/Plan Impression: Malposition of Port-A-Cath Plan: Patient will undergo revision of her Port-A-Cath on 03/09/2024.  The risks and benefits of the procedure including bleeding, infection, pneumothorax were fully explained to the patient, who gave informed consent.  Oneil Budge, MD 02/27/2024, 8:08 AM       [1]  Allergies Allergen Reactions   Latex Rash   Morphine And Codeine Other (See Comments)    Hallucinations    Tape Rash    Certain tapes

## 2024-03-04 NOTE — Patient Instructions (Signed)
 Nicole [name]  03/04/2024     @PREFPERIOPPHARMACY @   Your procedure is scheduled on  03/09/2024.   Report to [facility] at  0600 A.M.   Call this number if you have problems the morning of surgery:  [phone]  If you experience any cold or flu symptoms such as cough, fever, chills, shortness of breath, etc. between now and your scheduled surgery, please notify us  at the above number.   Remember:  Do not eat after midnight.  You may drink clear liquids until 0330 am on 03/09/2024.       Clear liquids allowed are:                    Water , Carbonated beverages (diabetics please choose diet or no sugar options), Black Coffee Only (No creamer, milk or cream, including half & half and powdered creamer), and Clear Sports drink (No red color; diabetics please choose diet or no sugar options)    Take these medicines the morning of surgery with A SIP OF WATER                   oxycodone (if needed), pantoprazole , zofran  (if needed).    Do not wear jewelry, make-up or nail polish, including gel polish,  artificial nails, or any other type of covering on natural nails (fingers and  toes).  Do not wear lotions, powders, or perfumes, or deodorant.  Do not shave 48 hours prior to surgery.  Men may shave face and neck.  Do not bring valuables to the hospital.  [facility] is not responsible for any belongings or valuables.  Contacts, dentures or bridgework may not be worn into surgery.  Leave your suitcase in the car.  After surgery it may be brought to your room.  For patients admitted to the hospital, discharge time will be determined by your treatment team.  Patients discharged the day of surgery will not be allowed to drive home and must have someone with them for 24 hours.    Special instructions:   DO NOT smoke tobacco or vape for 24 hours before your procedure   Please read over the following fact sheets that you were given. Coughing and Deep Breathing, Surgical  Site Infection Prevention, Anesthesia Post-op Instructions, and Care and Recovery After Surgery      Implanted Port Insertion, Care After The following information offers guidance on how to care for yourself after your procedure. Your health care provider may also give you more specific instructions. If you have problems or questions, contact your health care provider. What can I expect after the procedure? After the procedure, it is common to have: Discomfort at the port insertion site. Bruising on the skin over the port. This should improve over 3-4 days. Follow these instructions at home: [facility] care After your port is placed, you will get a manufacturer's information card. The card has information about your port. Keep this card with you at all times. Take care of the port as told by your health care provider. Ask your health care provider if you or a family member can get training for taking care of the port at home. A home health care nurse will be be available to help care for the port. Make sure to remember what type of port you have. Incision care     Follow instructions from your health care provider about how to take care of your port insertion site. Make  sure you: Wash your hands with soap and water  for at least 20 seconds before and after you change your bandage (dressing). If soap and water  are not available, use hand sanitizer. Change your dressing as told by your health care provider. Leave stitches (sutures), skin glue, or adhesive strips in place. These skin closures may need to stay in place for 2 weeks or longer. If adhesive strip edges start to loosen and curl up, you may trim the loose edges. Do not remove adhesive strips completely unless your health care provider tells you to do that. Check your port insertion site every day for signs of infection. Check for: Redness, swelling, or pain. Fluid or blood. Warmth. Pus or a bad smell. Activity Return to your normal  activities as told by your health care provider. Ask your health care provider what activities are safe for you. You may have to avoid lifting. Ask your health care provider how much you can safely lift. General instructions Take over-the-counter and prescription medicines only as told by your health care provider. Do not take baths, swim, or use a hot tub until your health care provider approves. Ask your health care provider if you may take showers. You may only be allowed to take sponge baths. If you were given a sedative during the procedure, it can affect you for several hours. Do not drive or operate machinery until your health care provider says that it is safe. Wear a medical alert bracelet in case of an emergency. This will tell any health care providers that you have a port. Keep all follow-up visits. This is important. Contact a health care provider if: You cannot flush your port with saline as directed, or you cannot draw blood from the port. You have a fever or chills. You have redness, swelling, or pain around your port insertion site. You have fluid or blood coming from your port insertion site. Your port insertion site feels warm to the touch. You have pus or a bad smell coming from the port insertion site. Get help right away if: You have chest pain or shortness of breath. You have bleeding from your port that you cannot control. These symptoms may be an emergency. Get help right away. Call 911. Do not wait to see if the symptoms will go away. Do not drive yourself to the hospital. Summary Take care of the port as told by your health care provider. Keep the manufacturer's information card with you at all times. Change your dressing as told by your health care provider. Contact a health care provider if you have a fever or chills or if you have redness, swelling, or pain around your port insertion site. Keep all follow-up visits. This information is not intended to replace  advice given to you by your health care provider. Make sure you discuss any questions you have with your health care provider. Document Revised: 09/05/2020 Document Reviewed: 09/05/2020 Elsevier Patient Education  2024 Elsevier Inc.General Anesthesia, Adult, Care After The following information offers guidance on how to care for yourself after your procedure. Your health care provider may also give you more specific instructions. If you have problems or questions, contact your health care provider. What can I expect after the procedure? After the procedure, it is common for people to: Have pain or discomfort at the IV site. Have nausea or vomiting. Have a sore throat or hoarseness. Have trouble concentrating. Feel cold or chills. Feel weak, sleepy, or tired (fatigue). Have soreness and body aches.  These can affect parts of the body that were not involved in surgery. Follow these instructions at home: For the time period you were told by your health care provider:  Rest. Do not participate in activities where you could fall or become injured. Do not drive or use machinery. Do not drink alcohol. Do not take sleeping pills or medicines that cause drowsiness. Do not make important decisions or sign legal documents. Do not take care of children on your own. General instructions Drink enough fluid to keep your urine pale yellow. If you have sleep apnea, surgery and certain medicines can increase your risk for breathing problems. Follow instructions from your health care provider about wearing your sleep device: Anytime you are sleeping, including during daytime naps. While taking prescription pain medicines, sleeping medicines, or medicines that make you drowsy. Return to your normal activities as told by your health care provider. Ask your health care provider what activities are safe for you. Take over-the-counter and prescription medicines only as told by your health care provider. Do not  use any products that contain nicotine or tobacco. These products include cigarettes, chewing tobacco, and vaping devices, such as e-cigarettes. These can delay incision healing after surgery. If you need help quitting, ask your health care provider. Contact a health care provider if: You have nausea or vomiting that does not get better with medicine. You vomit every time you eat or drink. You have pain that does not get better with medicine. You cannot urinate or have bloody urine. You develop a skin rash. You have a fever. Get help right away if: You have trouble breathing. You have chest pain. You vomit blood. These symptoms may be an emergency. Get help right away. Call 911. Do not wait to see if the symptoms will go away. Do not drive yourself to the hospital. Summary After the procedure, it is common to have a sore throat, hoarseness, nausea, vomiting, or to feel weak, sleepy, or fatigue. For the time period you were told by your health care provider, do not drive or use machinery. Get help right away if you have difficulty breathing, have chest pain, or vomit blood. These symptoms may be an emergency. This information is not intended to replace advice given to you by your health care provider. Make sure you discuss any questions you have with your health care provider. Document Revised: 06/01/2021 Document Reviewed: 06/01/2021 Elsevier Patient Education  2024 Elsevier Inc.How to Use Chlorhexidine  at Home in the Shower Chlorhexidine  gluconate (CHG) is a germ-killing (antiseptic) wash that's used to clean the skin. It can get rid of the germs that normally live on the skin and can keep them away for about 24 hours. If you're having surgery, you may be told to shower with CHG at home the night before surgery. This can help lower your risk for infection. To use CHG wash in the shower, follow the steps below. Supplies needed: CHG body wash. Clean washcloth. Clean towel. How to use CHG  in the shower Follow these steps unless you're told to use CHG in a different way: Start the shower. Use your normal soap and shampoo to wash your face and hair. Turn off the shower or move out of the shower stream. Pour CHG onto a clean washcloth. Do not use any type of brush or rough sponge. Start at your neck, washing your body down to your toes. Make sure you: Wash the part of your body where the surgery will be done for at  least 1 minute. Do not scrub. Do not use CHG on your head or face unless your health care provider tells you to. If it gets into your ears or eyes, rinse them well with water . Do not wash your genitals with CHG. Wash your back and under your arms. Make sure to wash skin folds. Let the CHG sit on your skin for 1-2 minutes or as long as told. Rinse your entire body in the shower, including all body creases and folds. Turn off the shower. Dry off with a clean towel. Do not put anything on your skin afterward, such as powder, lotion, or perfume. Put on clean clothes or pajamas. If it's the night before surgery, sleep in clean sheets. General tips Use CHG only as told, and follow the instructions on the label. Use the full amount of CHG as told. This is often one bottle. Do not smoke and stay away from flames after using CHG. Your skin may feel sticky after using CHG. This is normal. The sticky feeling will go away as the CHG dries. Do not use CHG: If you have a chlorhexidine  allergy or have reacted to chlorhexidine  in the past. On open wounds or areas of skin that have broken skin, cuts, or scrapes. On babies younger than 79 months of age. Contact a health care provider if: You have questions about using CHG. Your skin gets irritated or itchy. You have a rash after using CHG. You swallow any CHG. Call your local poison control center [phone] in the [place] Your eyes itch badly, or they become very red or swollen. Your hearing changes. You have trouble  seeing. If you can't reach your provider, go to an urgent care or emergency room. Do not drive yourself. Get help right away if: You have swelling or tingling in your mouth or throat. You make high-pitched whistling sounds when you breathe, most often when you breathe out (wheeze). You have trouble breathing. These symptoms may be an emergency. Call 911 right away. Do not wait to see if the symptoms will go away. Do not drive yourself to the hospital. This information is not intended to replace advice given to you by your health care provider. Make sure you discuss any questions you have with your health care provider. Document Revised: 09/17/2022 Document Reviewed: 09/13/2021 Elsevier Patient Education  2024 [employer].

## 2024-03-05 ENCOUNTER — Other Ambulatory Visit: Payer: Self-pay

## 2024-03-05 ENCOUNTER — Encounter (HOSPITAL_COMMUNITY)
Admission: RE | Admit: 2024-03-05 | Discharge: 2024-03-05 | Disposition: A | Discharge status: Home / Self Care | Source: Ambulatory Visit | Attending: General Surgery | Admitting: General Surgery

## 2024-03-05 ENCOUNTER — Encounter (HOSPITAL_COMMUNITY): Payer: Self-pay

## 2024-03-05 DIAGNOSIS — C241 Malignant neoplasm of ampulla of Vater: Secondary | ICD-10-CM | POA: Diagnosis not present

## 2024-03-05 DIAGNOSIS — Z01818 Encounter for other preprocedural examination: Secondary | ICD-10-CM

## 2024-03-05 DIAGNOSIS — Z01812 Encounter for preprocedural laboratory examination: Secondary | ICD-10-CM | POA: Diagnosis present

## 2024-03-05 LAB — BASIC METABOLIC PANEL WITH GFR
Anion gap: 14 (ref 5–15)
BUN: 18 mg/dL (ref 8–23)
CO2: 22 mmol/L (ref 22–32)
Calcium: 9.2 mg/dL (ref 8.9–10.3)
Chloride: 105 mmol/L (ref 98–111)
Creatinine, Ser: 0.86 mg/dL (ref 0.44–1.00)
GFR, Estimated: >60 mL/min
Glucose, Bld: 100 mg/dL — ABNORMAL HIGH (ref 70–99)
Potassium: 4.4 mmol/L (ref 3.5–5.1)
Sodium: 141 mmol/L (ref 135–145)

## 2024-03-05 LAB — CBC WITH DIFFERENTIAL/PLATELET
Abs Immature Granulocytes: 0.02 K/uL (ref 0.00–0.07)
Basophils Absolute: 0.1 K/uL (ref 0.0–0.1)
Basophils Relative: 2 %
Eosinophils Absolute: 0.3 K/uL (ref 0.0–0.5)
Eosinophils Relative: 4 %
HCT: 39.4 % (ref 36.0–46.0)
Hemoglobin: 12.5 g/dL (ref 12.0–15.0)
Immature Granulocytes: 0 %
Lymphocytes Relative: 18 %
Lymphs Abs: 1.3 K/uL (ref 0.7–4.0)
MCH: 28.9 pg (ref 26.0–34.0)
MCHC: 31.7 g/dL (ref 30.0–36.0)
MCV: 91 fL (ref 80.0–100.0)
Monocytes Absolute: 0.9 K/uL (ref 0.1–1.0)
Monocytes Relative: 13 %
Neutro Abs: 4.5 K/uL (ref 1.7–7.7)
Neutrophils Relative %: 63 %
Platelets: 195 K/uL (ref 150–400)
RBC: 4.33 MIL/uL (ref 3.87–5.11)
RDW: 13.9 % (ref 11.5–15.5)
WBC: 7 K/uL (ref 4.0–10.5)
nRBC: 0 % (ref 0.0–0.2)

## 2024-03-08 ENCOUNTER — Telehealth: Payer: Self-pay | Admitting: Licensed Clinical Social Worker

## 2024-03-08 ENCOUNTER — Inpatient Hospital Stay

## 2024-03-08 DIAGNOSIS — Z1507 Genetic susceptibility to malignant neoplasm of urinary tract: Secondary | ICD-10-CM

## 2024-03-08 LAB — GENETIC SCREENING ORDER

## 2024-03-08 NOTE — Telephone Encounter (Signed)
 Placed order for [employer] Common Hereditary Cancers+RNA Panel today for POC testing.  Dena Cary, MS, [facility] Genetic Counselor [email or web address] Phone: [phone]

## 2024-03-09 ENCOUNTER — Ambulatory Visit (HOSPITAL_COMMUNITY)

## 2024-03-09 ENCOUNTER — Encounter (HOSPITAL_COMMUNITY): Payer: Self-pay | Admitting: Anesthesiology

## 2024-03-09 ENCOUNTER — Encounter (HOSPITAL_COMMUNITY): Payer: Self-pay | Admitting: General Surgery

## 2024-03-09 ENCOUNTER — Ambulatory Visit (HOSPITAL_COMMUNITY)
Admission: RE | Admit: 2024-03-09 | Discharge: 2024-03-09 | Disposition: A | Discharge status: Home / Self Care | Attending: General Surgery | Admitting: General Surgery

## 2024-03-09 ENCOUNTER — Encounter: Admission: RE | Disposition: A | Payer: Self-pay | Attending: General Surgery

## 2024-03-09 ENCOUNTER — Ambulatory Visit (HOSPITAL_COMMUNITY): Payer: Self-pay | Admitting: Anesthesiology

## 2024-03-09 DIAGNOSIS — I10 Essential (primary) hypertension: Secondary | ICD-10-CM | POA: Diagnosis not present

## 2024-03-09 DIAGNOSIS — T82524A Displacement of infusion catheter, initial encounter: Secondary | ICD-10-CM

## 2024-03-09 DIAGNOSIS — C241 Malignant neoplasm of ampulla of Vater: Secondary | ICD-10-CM | POA: Insufficient documentation

## 2024-03-09 DIAGNOSIS — Z452 Encounter for adjustment and management of vascular access device: Secondary | ICD-10-CM | POA: Diagnosis not present

## 2024-03-09 HISTORY — PX: PORTACATH PLACEMENT: SHX2246

## 2024-03-09 HISTORY — PX: PORT-A-CATH REMOVAL: SHX5289

## 2024-03-09 SURGERY — REMOVAL PORT-A-CATH
Anesthesia: Monitor Anesthesia Care | Site: Chest | Laterality: Right

## 2024-03-09 MED ORDER — EPHEDRINE 5 MG/ML INJ
INTRAVENOUS | Status: AC
  Filled 2024-03-09: qty 5

## 2024-03-09 MED ORDER — OXYCODONE HCL 5 MG PO TABS: 5.0000 mg | ORAL_TABLET | Freq: Once | ORAL | Status: DC | PRN

## 2024-03-09 MED ORDER — LIDOCAINE HCL (CARDIAC) PF 100 MG/5ML IV SOSY
PREFILLED_SYRINGE | INTRAVENOUS | Status: DC | PRN
  Administered 2024-03-09: 100 mg via INTRAVENOUS

## 2024-03-09 MED ORDER — EPHEDRINE SULFATE (PRESSORS) 25 MG/5ML IV SOSY
PREFILLED_SYRINGE | INTRAVENOUS | Status: DC | PRN
  Administered 2024-03-09: 10 mg via INTRAVENOUS

## 2024-03-09 MED ORDER — LIDOCAINE HCL (PF) 1 % IJ SOLN
INTRAMUSCULAR | Status: DC | PRN
  Administered 2024-03-09: 10 mL

## 2024-03-09 MED ORDER — ONDANSETRON HCL 4 MG/2ML IJ SOLN: 4.0000 mg | Freq: Once | INTRAMUSCULAR | Status: DC | PRN

## 2024-03-09 MED ORDER — SUGAMMADEX SODIUM 200 MG/2ML IV SOLN
INTRAVENOUS | Status: AC
  Filled 2024-03-09: qty 2

## 2024-03-09 MED ORDER — CEFAZOLIN SODIUM-DEXTROSE 2-4 GM/100ML-% IV SOLN
2.0000 g | INTRAVENOUS | Status: AC
  Administered 2024-03-09: 2 g via INTRAVENOUS
  Filled 2024-03-09: qty 100

## 2024-03-09 MED ORDER — SODIUM CHLORIDE (PF) 0.9 % IJ SOLN
INTRAMUSCULAR | Status: DC | PRN
  Administered 2024-03-09: 500 mL via INTRAVENOUS

## 2024-03-09 MED ORDER — DEXAMETHASONE SOD PHOSPHATE PF 10 MG/ML IJ SOLN
INTRAMUSCULAR | Status: DC | PRN
  Administered 2024-03-09: 4 mg via INTRAVENOUS

## 2024-03-09 MED ORDER — CHLORHEXIDINE GLUCONATE CLOTH 2 % EX PADS
6.0000 | MEDICATED_PAD | Freq: Once | CUTANEOUS | Status: DC
  Administered 2024-03-09: 6 via TOPICAL

## 2024-03-09 MED ORDER — LACTATED RINGERS IV SOLN: INTRAVENOUS | Status: DC

## 2024-03-09 MED ORDER — KETOROLAC TROMETHAMINE 30 MG/ML IJ SOLN: 15.0000 mg | Freq: Once | INTRAMUSCULAR | Status: DC

## 2024-03-09 MED ORDER — LIDOCAINE 2% (20 MG/ML) 5 ML SYRINGE
INTRAMUSCULAR | Status: AC
  Filled 2024-03-09: qty 5

## 2024-03-09 MED ORDER — ONDANSETRON HCL 4 MG/2ML IJ SOLN
INTRAMUSCULAR | Status: DC | PRN
  Administered 2024-03-09: 4 mg via INTRAVENOUS

## 2024-03-09 MED ORDER — HEPARIN SOD (PORK) LOCK FLUSH 100 UNIT/ML IV SOLN
INTRAVENOUS | Status: AC
  Filled 2024-03-09: qty 5

## 2024-03-09 MED ORDER — FENTANYL CITRATE (PF) 50 MCG/ML IJ SOSY: 25.0000 ug | PREFILLED_SYRINGE | INTRAMUSCULAR | Status: DC | PRN

## 2024-03-09 MED ORDER — ONDANSETRON HCL 4 MG/2ML IJ SOLN
INTRAMUSCULAR | Status: AC
  Filled 2024-03-09: qty 2

## 2024-03-09 MED ORDER — PROPOFOL 10 MG/ML IV BOLUS
INTRAVENOUS | Status: DC | PRN
  Administered 2024-03-09: 200 mg via INTRAVENOUS

## 2024-03-09 MED ORDER — LIDOCAINE HCL (PF) 1 % IJ SOLN
INTRAMUSCULAR | Status: AC
  Filled 2024-03-09: qty 30

## 2024-03-09 MED ORDER — ORAL CARE MOUTH RINSE: 15.0000 mL | Freq: Once | OROMUCOSAL | Status: AC

## 2024-03-09 MED ORDER — OXYCODONE HCL 5 MG/5ML PO SOLN: 5.0000 mg | Freq: Once | ORAL | Status: DC | PRN

## 2024-03-09 MED ORDER — SEVOFLURANE IN SOLN
RESPIRATORY_TRACT | Status: AC
  Filled 2024-03-09: qty 500

## 2024-03-09 MED ORDER — PROPOFOL 10 MG/ML IV BOLUS
INTRAVENOUS | Status: AC
  Filled 2024-03-09: qty 20

## 2024-03-09 MED ORDER — HEPARIN SOD (PORK) LOCK FLUSH 100 UNIT/ML IV SOLN
INTRAVENOUS | Status: DC | PRN
  Administered 2024-03-09: 500 [IU] via INTRAVENOUS

## 2024-03-09 MED ORDER — OXYCODONE HCL 5 MG PO TABS: 5.0000 mg | ORAL_TABLET | ORAL | 0 refills | Status: AC | PRN

## 2024-03-09 MED ORDER — FENTANYL CITRATE (PF) 250 MCG/5ML IJ SOLN
INTRAMUSCULAR | Status: DC | PRN
  Administered 2024-03-09 (×4): 25 ug via INTRAVENOUS

## 2024-03-09 MED ORDER — FENTANYL CITRATE (PF) 100 MCG/2ML IJ SOLN
INTRAMUSCULAR | Status: AC
  Filled 2024-03-09: qty 2

## 2024-03-09 MED ORDER — CHLORHEXIDINE GLUCONATE 0.12 % MT SOLN
15.0000 mL | Freq: Once | OROMUCOSAL | Status: AC
  Administered 2024-03-09: 15 mL via OROMUCOSAL

## 2024-03-09 MED ORDER — CHLORHEXIDINE GLUCONATE CLOTH 2 % EX PADS
6.0000 | MEDICATED_PAD | Freq: Once | CUTANEOUS | Status: AC
  Administered 2024-03-09: 6 via TOPICAL

## 2024-03-09 SURGICAL SUPPLY — 25 items
APPLICATOR CHLORAPREP 10.5 ORG (MISCELLANEOUS) ×2 IMPLANT
BAG DECANTER FOR FLEXI CONT (MISCELLANEOUS) ×2 IMPLANT
CLOTH BEACON ORANGE TIMEOUT ST (SAFETY) ×2 IMPLANT
COVER LIGHT HANDLE (MISCELLANEOUS) IMPLANT
DERMABOND ADVANCED .7 DNX12 (GAUZE/BANDAGES/DRESSINGS) ×2 IMPLANT
DRAPE C-ARM FOLDED MOBILE STRL (DRAPES) ×2 IMPLANT
DRAPE HALF SHEET 40X57 (DRAPES) ×2 IMPLANT
ELECTRODE REM PT RTRN 9FT ADLT (ELECTROSURGICAL) ×2 IMPLANT
GLOVE BIOGEL PI IND STRL 7.0 (GLOVE) ×4 IMPLANT
GLOVE SURG SS PI 7.0 STRL IVOR (GLOVE) IMPLANT
GLOVE SURG SS PI 7.5 STRL IVOR (GLOVE) ×4 IMPLANT
GOWN STRL REUS W/TWL LRG LVL3 (GOWN DISPOSABLE) ×4 IMPLANT
IV NS 500ML BAXH (IV SOLUTION) ×2 IMPLANT
KIT PORT INFUSION SMART 8FR (Port) IMPLANT
KIT TURNOVER KIT A (KITS) ×2 IMPLANT
NDL HYPO 25X1 1.5 SAFETY (NEEDLE) ×2 IMPLANT
NEEDLE HYPO 25X1 1.5 SAFETY (NEEDLE) ×2 IMPLANT
PACK MINOR (CUSTOM PROCEDURE TRAY) ×2 IMPLANT
PAD ARMBOARD POSITIONER FOAM (MISCELLANEOUS) ×2 IMPLANT
POSITIONER HEAD 8X9X4 ADT (SOFTGOODS) ×2 IMPLANT
SET BASIN LINEN APH (SET/KITS/TRAYS/PACK) ×2 IMPLANT
SUT MNCRL AB 4-0 PS2 18 (SUTURE) ×2 IMPLANT
SUT VIC AB 3-0 SH 27X BRD (SUTURE) ×2 IMPLANT
SYR 5ML LL (SYRINGE) ×2 IMPLANT
SYR CONTROL 10ML LL (SYRINGE) ×2 IMPLANT

## 2024-03-09 NOTE — Anesthesia Procedure Notes (Signed)
 Procedure Name: LMA Insertion Date/Time: 03/09/2024 7:25 AM  Performed by: Pheobe Adine CROME, CRNAPre-anesthesia Checklist: Patient identified, Emergency Drugs available, Suction available, Patient being monitored and Timeout performed Patient Re-evaluated:Patient Re-evaluated prior to induction Oxygen Delivery Method: Circle system utilized Preoxygenation: Pre-oxygenation with 100% oxygen Induction Type: IV induction LMA: LMA inserted LMA Size: 3.0 Number of attempts: 1 Placement Confirmation: positive ETCO2, CO2 detector and breath sounds checked- equal and bilateral Tube secured with: Tape Dental Injury: Teeth and Oropharynx as per pre-operative assessment

## 2024-03-09 NOTE — Op Note (Signed)
 Patient:  Nicole [name]  DOB:  [birth year]  MRN:  [id number]   Preop Diagnosis: Malposition of Port-A-Cath  Postop Diagnosis: Same  Procedure: Insertion of Port-A-Cath on right side, removal of left-sided Port-A-Cath  Surgeon: Oneil Budge, MD  Anes: General  Indications: Patient is a 73 year old white female status post left sided Port-A-Cath placement earlier this year who now presents with a malpositioned and malfunctioning Port-A-Cath.  The risks and benefits of the procedure including bleeding, infection, pneumothorax were fully explained to the patient, who gave informed consent.  Procedure note: The patient was placed in the supine position.  After general anesthesia was administered, the upper chest was prepped and draped using the usual sterile technique with ChloraPrep.  Surgical site confirmation was performed.  An incision was made below the right clavicle.  A subcutaneous pocket was formed.  A needle was advanced into the right subclavian vein using the Seldinger technique without difficulty.  A guidewire was then advanced into the right atrium under fluoroscopic guidance.  An introducer and peel-away sheath were placed over the guidewire.  The catheter was inserted through the peel-away sheath and the peel-away sheath was removed.  The catheter was then attached to the port and the port placed in subcutaneous pocket.  Adequate positioning was confirmed by fluoroscopy.  Good backflow of venous blood was noted on aspiration of the port.  The port was flushed with hep flush.  The subcutaneous layer was reapproximated using a 3-0 Vicryl interrupted suture.  The skin was closed using a 4-0 Monocryl subcuticular suture.  1% Xylocaine  was instilled into the surrounding wound.  Dermabond was applied.  Next, the left-sided Port-A-Cath was removed.  Incision was made down to the port.  The port was removed in total without difficulty.  1% Xylocaine  was placed into the surrounding soft  tissue.  The subcutaneous layer was reapproximated using a 3-0 Vicryl interrupted suture.  The skin was closed using a 4-0 Monocryl subcuticular suture.  Dermabond was applied.  All tape and needle counts were correct at the end of the procedures.  The patient was awakened and transferred to PACU in stable condition.  A chest x-ray will be performed at that time.  Complications: None  EBL: Minimal  Specimen: None

## 2024-03-09 NOTE — Anesthesia Preprocedure Evaluation (Signed)
"                                    Anesthesia Evaluation  Patient identified by MRN, date of birth, ID band Patient awake    Reviewed: Allergy & Precautions, H&P , NPO status , Patient's Chart, lab work & pertinent test results, reviewed documented beta blocker date and time   Airway Mallampati: II  TM Distance: >3 FB Neck ROM: full    Dental no notable dental hx.    Pulmonary neg pulmonary ROS   Pulmonary exam normal breath sounds clear to auscultation       Cardiovascular Exercise Tolerance: Good hypertension, negative cardio ROS  Rhythm:regular Rate:Normal     Neuro/Psych  Neuromuscular disease  negative psych ROS   GI/Hepatic Neg liver ROS, PUD,,,  Endo/Other  negative endocrine ROS    Renal/GU Renal disease  negative genitourinary   Musculoskeletal   Abdominal   Peds  Hematology  (+) Blood dyscrasia, anemia   Anesthesia Other Findings   Reproductive/Obstetrics negative OB ROS                              Anesthesia Physical Anesthesia Plan  ASA: 3  Anesthesia Plan: MAC   Post-op Pain Management:    Induction:   PONV Risk Score and Plan: Propofol  infusion  Airway Management Planned:   Additional Equipment:   Intra-op Plan:   Post-operative Plan:   Informed Consent: I have reviewed the patients History and Physical, chart, labs and discussed the procedure including the risks, benefits and alternatives for the proposed anesthesia with the patient or authorized representative who has indicated his/her understanding and acceptance.     Dental Advisory Given  Plan Discussed with: CRNA  Anesthesia Plan Comments:         Anesthesia Quick Evaluation  "

## 2024-03-09 NOTE — Transfer of Care (Signed)
 Immediate Anesthesia Transfer of Care Note  Patient: Nicole [name]  Procedure(s) Performed: REMOVAL PORT-A-CATH (Left: Chest) INSERTION, TUNNELED CENTRAL VENOUS DEVICE, WITH PORT (Right: Chest)  Patient Location: PACU  Anesthesia Type:General  Level of Consciousness: awake, alert , oriented, and patient cooperative  Airway & Oxygen Therapy: Patient Spontanous Breathing and Patient connected to face mask oxygen  Post-op Assessment: Report given to RN and Post -op Vital signs reviewed and stable  Post vital signs: Reviewed and stable  Last Vitals:  Vitals Value Taken Time  BP 118/95 03/09/24 08:12  Temp 36.9 C 03/09/24 08:12  Pulse 93 03/09/24 08:12  Resp 18 03/09/24 08:12  SpO2 100 % 03/09/24 08:12    Last Pain:  Vitals:   03/09/24 0637  PainSc: 0-No pain         Complications: No notable events documented.

## 2024-03-09 NOTE — Interval H&P Note (Signed)
 History and Physical Interval Note:  03/09/2024 7:09 AM  Nicole [name]  has presented today for surgery, with the diagnosis of AMPULLARY CARCINOMA PORT A CATH DISPLACEMENT.  The various methods of treatment have been discussed with the patient and family. After consideration of risks, benefits and other options for treatment, the patient has consented to  Procedures: REMOVAL PORT-A-CATH (Left) INSERTION, TUNNELED CENTRAL VENOUS DEVICE, WITH PORT (Left) as a surgical intervention.  The patient's history has been reviewed, patient examined, no change in status, stable for surgery.  I have reviewed the patient's chart and labs.  Questions were answered to the patient's satisfaction.     Oneil Budge

## 2024-03-10 ENCOUNTER — Encounter (HOSPITAL_COMMUNITY): Payer: Self-pay | Admitting: General Surgery

## 2024-03-10 NOTE — Anesthesia Postprocedure Evaluation (Signed)
"   Anesthesia Post Note  Patient: Nicole [name]  Procedure(s) Performed: REMOVAL PORT-A-CATH (Left: Chest) INSERTION, TUNNELED CENTRAL VENOUS DEVICE, WITH PORT (Right: Chest)  Patient location during evaluation: Phase II Anesthesia Type: MAC Level of consciousness: awake Pain management: pain level controlled Vital Signs Assessment: post-procedure vital signs reviewed and stable Respiratory status: spontaneous breathing and respiratory function stable Cardiovascular status: blood pressure returned to baseline and stable Postop Assessment: no headache and no apparent nausea or vomiting Anesthetic complications: no Comments: Late entry   No notable events documented.   Last Vitals:  Vitals:   03/09/24 0815 03/09/24 0840  BP: 134/82 (!) 135/90  Pulse:  75  Resp: 18 18  Temp:  36.4 C  SpO2: 100% 100%    Last Pain:  Vitals:   03/09/24 0840  TempSrc: Oral  PainSc: 0-No pain                 Yvonna PARAS Elif Yonts      "

## 2024-03-15 ENCOUNTER — Encounter: Payer: Self-pay | Admitting: *Deleted

## 2024-03-22 ENCOUNTER — Inpatient Hospital Stay: Admitting: Oncology

## 2024-03-22 ENCOUNTER — Inpatient Hospital Stay

## 2024-03-22 ENCOUNTER — Inpatient Hospital Stay: Attending: Oncology

## 2024-03-22 VITALS — BP 136/59 | HR 94 | Resp 18

## 2024-03-22 DIAGNOSIS — C241 Malignant neoplasm of ampulla of Vater: Secondary | ICD-10-CM | POA: Insufficient documentation

## 2024-03-22 DIAGNOSIS — Z15068 Genetic susceptibility to other malignant neoplasm of digestive system: Secondary | ICD-10-CM | POA: Insufficient documentation

## 2024-03-22 DIAGNOSIS — Z5112 Encounter for antineoplastic immunotherapy: Secondary | ICD-10-CM | POA: Insufficient documentation

## 2024-03-22 DIAGNOSIS — Z1507 Genetic susceptibility to malignant neoplasm of urinary tract: Secondary | ICD-10-CM | POA: Insufficient documentation

## 2024-03-22 DIAGNOSIS — Z15060 Genetic susceptibility to colorectal cancer: Secondary | ICD-10-CM | POA: Insufficient documentation

## 2024-03-22 DIAGNOSIS — Z1509 Genetic susceptibility to other malignant neoplasm: Secondary | ICD-10-CM | POA: Insufficient documentation

## 2024-03-22 LAB — COMPREHENSIVE METABOLIC PANEL WITH GFR
ALT: 20 U/L (ref 0–44)
AST: 30 U/L (ref 15–41)
Albumin: 4 g/dL (ref 3.5–5.0)
Alkaline Phosphatase: 247 U/L — ABNORMAL HIGH (ref 38–126)
Anion gap: 11 (ref 5–15)
BUN: 22 mg/dL (ref 8–23)
CO2: 23 mmol/L (ref 22–32)
Calcium: 8.9 mg/dL (ref 8.9–10.3)
Chloride: 105 mmol/L (ref 98–111)
Creatinine, Ser: 0.8 mg/dL (ref 0.44–1.00)
GFR, Estimated: >60 mL/min
Glucose, Bld: 104 mg/dL — ABNORMAL HIGH (ref 70–99)
Potassium: 4 mmol/L (ref 3.5–5.1)
Sodium: 139 mmol/L (ref 135–145)
Total Bilirubin: 0.3 mg/dL (ref 0.0–1.2)
Total Protein: 6.5 g/dL (ref 6.5–8.1)

## 2024-03-22 LAB — CBC WITH DIFFERENTIAL/PLATELET
Abs Immature Granulocytes: 0.02 K/uL (ref 0.00–0.07)
Basophils Absolute: 0.1 K/uL (ref 0.0–0.1)
Basophils Relative: 1 %
Eosinophils Absolute: 0.3 K/uL (ref 0.0–0.5)
Eosinophils Relative: 4 %
HCT: 39.2 % (ref 36.0–46.0)
Hemoglobin: 12.4 g/dL (ref 12.0–15.0)
Immature Granulocytes: 0 %
Lymphocytes Relative: 18 %
Lymphs Abs: 1.2 K/uL (ref 0.7–4.0)
MCH: 28.9 pg (ref 26.0–34.0)
MCHC: 31.6 g/dL (ref 30.0–36.0)
MCV: 91.4 fL (ref 80.0–100.0)
Monocytes Absolute: 0.7 K/uL (ref 0.1–1.0)
Monocytes Relative: 10 %
Neutro Abs: 4.7 K/uL (ref 1.7–7.7)
Neutrophils Relative %: 67 %
Platelets: 176 K/uL (ref 150–400)
RBC: 4.29 MIL/uL (ref 3.87–5.11)
RDW: 14.1 % (ref 11.5–15.5)
WBC: 7 K/uL (ref 4.0–10.5)
nRBC: 0 % (ref 0.0–0.2)

## 2024-03-22 LAB — MAGNESIUM: Magnesium: 2 mg/dL (ref 1.7–2.4)

## 2024-03-22 MED ORDER — SODIUM CHLORIDE 0.9 % IV SOLN: INTRAVENOUS | Status: DC

## 2024-03-22 MED ORDER — SODIUM CHLORIDE 0.9 % IV SOLN
200.0000 mg | Freq: Once | INTRAVENOUS | Status: AC
  Administered 2024-03-22: 200 mg via INTRAVENOUS
  Filled 2024-03-22: qty 8

## 2024-03-22 NOTE — Progress Notes (Signed)
 Patient presents today for chemotherapy Keytruda  infusion.  Patient is in satisfactory condition with no new complaints voiced.  Vital signs are stable.  Labs reviewed and all labs are within treatment parameters.  We will proceed with treatment per MD orders.    Treatment given today per MD orders. Tolerated infusion without adverse affects. Vital signs stable. No complaints at this time. Discharged from clinic ambulatory in stable condition. Alert and oriented x 3. F/U with [facility] as scheduled.

## 2024-03-22 NOTE — Patient Instructions (Signed)
 [facility] [place] - A DEPT OF [facility]  Discharge Instructions: Thank you for choosing [facility] to provide your oncology and hematology care.  If you have a lab appointment with the Cancer Center - please note that after April 8th, 2024, all labs will be drawn in the cancer center.  You do not have to check in or register with the main entrance as you have in the past but will complete your check-in in the cancer center.  Wear comfortable clothing and clothing appropriate for easy access to any Portacath or PICC line.   We strive to give you quality time with your provider. You may need to reschedule your appointment if you arrive late (15 or more minutes).  Arriving late affects you and other patients whose appointments are after yours.  Also, if you miss three or more appointments without notifying the office, you may be dismissed from the clinic at the provider's discretion.      For prescription refill requests, have your pharmacy contact our office and allow 72 hours for refills to be completed.    Today you received the following chemotherapy and/or immunotherapy agents Keytruda   To help prevent nausea and vomiting after your treatment, we encourage you to take your nausea medication as directed.  Pembrolizumab Injection What is this medication? PEMBROLIZUMAB (PEM broe LIZ ue mab) treats some types of cancer. It works by helping your immune system slow or stop the spread of cancer cells. It is a monoclonal antibody. This medicine may be used for other purposes; ask your health care provider or pharmacist if you have questions. COMMON BRAND NAME(S): Keytruda What should I tell my care team before I take this medication? They need to know if you have any of these conditions: Allogeneic stem cell transplant (uses someone else's stem cells) Autoimmune diseases, such as Crohn disease, ulcerative colitis, lupus History of chest radiation Nervous  system problems, such as Guillain-Barre syndrome, myasthenia gravis Organ transplant An unusual or allergic reaction to pembrolizumab, other medications, foods, dyes, or preservatives Pregnant or trying to get pregnant Breast-feeding How should I use this medication? This medication is injected into a vein. It is given by your care team in a hospital or clinic setting. A special MedGuide will be given to you before each treatment. Be sure to read this information carefully each time. Talk to your care team about the use of this medication in children. While it may be prescribed for children as young as 6 months for selected conditions, precautions do apply. Overdosage: If you think you have taken too much of this medicine contact a poison control center or emergency room at once. NOTE: This medicine is only for you. Do not share this medicine with others. What if I miss a dose? Keep appointments for follow-up doses. It is important not to miss your dose. Call your care team if you are unable to keep an appointment. What may interact with this medication? Interactions have not been studied. This list may not describe all possible interactions. Give your health care provider a list of all the medicines, herbs, non-prescription drugs, or dietary supplements you use. Also tell them if you smoke, drink alcohol, or use illegal drugs. Some items may interact with your medicine. What should I watch for while using this medication? Your condition will be monitored carefully while you are receiving this medication. You may need blood work while taking this medication. This medication may cause  serious skin reactions. They can happen weeks to months after starting the medication. Contact your care team right away if you notice fevers or flu-like symptoms with a rash. The rash may be red or purple and then turn into blisters or peeling of the skin. You may also notice a red rash with swelling of the face,  lips, or lymph nodes in your neck or under your arms. Tell your care team right away if you have any change in your eyesight. Talk to your care team if you may be pregnant. Serious birth defects can occur if you take this medication during pregnancy and for 4 months after the last dose. You will need a negative pregnancy test before starting this medication. Contraception is recommended while taking this medication and for 4 months after the last dose. Your care team can help you find the option that works for you. Do not breastfeed while taking this medication and for 4 months after the last dose. What side effects may I notice from receiving this medication? Side effects that you should report to your care team as soon as possible: Allergic reactions--skin rash, itching, hives, swelling of the face, lips, tongue, or throat Dry cough, shortness of breath or trouble breathing Eye pain, redness, irritation, or discharge with blurry or decreased vision Heart muscle inflammation--unusual weakness or fatigue, shortness of breath, chest pain, fast or irregular heartbeat, dizziness, swelling of the ankles, feet, or hands Hormone gland problems--headache, sensitivity to light, unusual weakness or fatigue, dizziness, fast or irregular heartbeat, increased sensitivity to cold or heat, excessive sweating, constipation, hair loss, increased thirst or amount of urine, tremors or shaking, irritability Infusion reactions--chest pain, shortness of breath or trouble breathing, feeling faint or lightheaded Kidney injury (glomerulonephritis)--decrease in the amount of urine, red or dark brown urine, foamy or bubbly urine, swelling of the ankles, hands, or feet Liver injury--right upper belly pain, loss of appetite, nausea, light-colored stool, dark yellow or brown urine, yellowing skin or eyes, unusual weakness or fatigue Pain, tingling, or numbness in the hands or feet, muscle weakness, change in vision, confusion or  trouble speaking, loss of balance or coordination, trouble walking, seizures Rash, fever, and swollen lymph nodes Redness, blistering, peeling, or loosening of the skin, including inside the mouth Sudden or severe stomach pain, bloody diarrhea, fever, nausea, vomiting Side effects that usually do not require medical attention (report to your care team if they continue or are bothersome): Bone, joint, or muscle pain Diarrhea Fatigue Loss of appetite Nausea Skin rash This list may not describe all possible side effects. Call your doctor for medical advice about side effects. You may report side effects to FDA at 1-800-FDA-1088. Where should I keep my medication? This medication is given in a hospital or clinic. It will not be stored at home. NOTE: This sheet is a summary. It may not cover all possible information. If you have questions about this medicine, talk to your doctor, pharmacist, or health care provider.  2024 Elsevier/Gold Standard (2021-07-17 00:00:00)  BELOW ARE SYMPTOMS THAT SHOULD BE REPORTED IMMEDIATELY: *FEVER GREATER THAN 100.4 F (38 C) OR HIGHER *CHILLS OR SWEATING *NAUSEA AND VOMITING THAT IS NOT CONTROLLED WITH YOUR NAUSEA MEDICATION *UNUSUAL SHORTNESS OF BREATH *UNUSUAL BRUISING OR BLEEDING *URINARY PROBLEMS (pain or burning when urinating, or frequent urination) *BOWEL PROBLEMS (unusual diarrhea, constipation, pain near the anus) TENDERNESS IN MOUTH AND THROAT WITH OR WITHOUT PRESENCE OF ULCERS (sore throat, sores in mouth, or a toothache) UNUSUAL RASH, SWELLING OR PAIN  UNUSUAL VAGINAL DISCHARGE OR ITCHING   Items with * indicate a potential emergency and should be followed up as soon as possible or go to the Emergency Department if any problems should occur.  Please show the CHEMOTHERAPY ALERT CARD or IMMUNOTHERAPY ALERT CARD at check-in to the Emergency Department and triage nurse.  Should you have questions after your visit or need to cancel or reschedule  your appointment, please contact [facility] [place] - A DEPT OF [facility] [phone]  and follow the prompts.  Office hours are 8:00 a.m. to 4:30 p.m. Monday - Friday. Please note that voicemails left after 4:00 p.m. may not be returned until the following business day.  We are closed weekends and major holidays. You have access to a nurse at all times for urgent questions. Please call the main number to the clinic [phone] and follow the prompts.  For any non-urgent questions, you may also contact your provider using MyChart. We now offer e-Visits for anyone 43 and older to request care online for non-urgent symptoms. For details visit [email or web address].   Also download the MyChart app! Go to the app store, search "MyChart", open the app, select [place], and log in with your MyChart username and password.

## 2024-03-23 LAB — CEA: CEA: 3.4 ng/mL (ref 0.0–4.7)

## 2024-04-05 ENCOUNTER — Other Ambulatory Visit: Payer: Self-pay | Admitting: Oncology

## 2024-04-05 ENCOUNTER — Encounter: Payer: Self-pay | Admitting: Oncology

## 2024-04-05 DIAGNOSIS — C241 Malignant neoplasm of ampulla of Vater: Secondary | ICD-10-CM

## 2024-04-08 NOTE — Progress Notes (Signed)
 Pt's infusion appt moved d/t anticipation of inclement weather on Monday 1/26.  Barton Want, PharmD, MBA

## 2024-04-09 ENCOUNTER — Inpatient Hospital Stay

## 2024-04-09 VITALS — BP 129/63 | HR 65 | Temp 97.9°F | Resp 18

## 2024-04-09 DIAGNOSIS — C241 Malignant neoplasm of ampulla of Vater: Secondary | ICD-10-CM

## 2024-04-09 LAB — COMPREHENSIVE METABOLIC PANEL WITH GFR
ALT: 21 U/L (ref 0–44)
AST: 27 U/L (ref 15–41)
Albumin: 3.8 g/dL (ref 3.5–5.0)
Alkaline Phosphatase: 224 U/L — ABNORMAL HIGH (ref 38–126)
Anion gap: 11 (ref 5–15)
BUN: 20 mg/dL (ref 8–23)
CO2: 24 mmol/L (ref 22–32)
Calcium: 8.7 mg/dL — ABNORMAL LOW (ref 8.9–10.3)
Chloride: 105 mmol/L (ref 98–111)
Creatinine, Ser: 0.75 mg/dL (ref 0.44–1.00)
GFR, Estimated: >60 mL/min
Glucose, Bld: 88 mg/dL (ref 70–99)
Potassium: 4.4 mmol/L (ref 3.5–5.1)
Sodium: 140 mmol/L (ref 135–145)
Total Bilirubin: 0.3 mg/dL (ref 0.0–1.2)
Total Protein: 6.2 g/dL — ABNORMAL LOW (ref 6.5–8.1)

## 2024-04-09 LAB — MAGNESIUM: Magnesium: 2.1 mg/dL (ref 1.7–2.4)

## 2024-04-09 LAB — CBC WITH DIFFERENTIAL/PLATELET
Abs Immature Granulocytes: 0.03 K/uL (ref 0.00–0.07)
Basophils Absolute: 0.1 K/uL (ref 0.0–0.1)
Basophils Relative: 1 %
Eosinophils Absolute: 0.1 K/uL (ref 0.0–0.5)
Eosinophils Relative: 2 %
HCT: 37.5 % (ref 36.0–46.0)
Hemoglobin: 11.9 g/dL — ABNORMAL LOW (ref 12.0–15.0)
Immature Granulocytes: 0 %
Lymphocytes Relative: 13 %
Lymphs Abs: 1 K/uL (ref 0.7–4.0)
MCH: 29.2 pg (ref 26.0–34.0)
MCHC: 31.7 g/dL (ref 30.0–36.0)
MCV: 91.9 fL (ref 80.0–100.0)
Monocytes Absolute: 0.7 K/uL (ref 0.1–1.0)
Monocytes Relative: 9 %
Neutro Abs: 5.8 K/uL (ref 1.7–7.7)
Neutrophils Relative %: 75 %
Platelets: 149 K/uL — ABNORMAL LOW (ref 150–400)
RBC: 4.08 MIL/uL (ref 3.87–5.11)
RDW: 14.6 % (ref 11.5–15.5)
WBC: 7.8 K/uL (ref 4.0–10.5)
nRBC: 0 % (ref 0.0–0.2)

## 2024-04-09 MED ORDER — SODIUM CHLORIDE 0.9 % IV SOLN: INTRAVENOUS | Status: DC

## 2024-04-09 MED ORDER — SODIUM CHLORIDE 0.9 % IV SOLN
200.0000 mg | Freq: Once | INTRAVENOUS | Status: AC
  Administered 2024-04-09: 200 mg via INTRAVENOUS
  Filled 2024-04-09: qty 200

## 2024-04-09 NOTE — Patient Instructions (Signed)
 [facility] [place] - A DEPT OF [facility]  Discharge Instructions: Thank you for choosing [facility] to provide your oncology and hematology care.  If you have a lab appointment with the Cancer Center - please note that after April 8th, 2024, all labs will be drawn in the cancer center.  You do not have to check in or register with the main entrance as you have in the past but will complete your check-in in the cancer center.  Wear comfortable clothing and clothing appropriate for easy access to any Portacath or PICC line.   We strive to give you quality time with your provider. You may need to reschedule your appointment if you arrive late (15 or more minutes).  Arriving late affects you and other patients whose appointments are after yours.  Also, if you miss three or more appointments without notifying the office, you may be dismissed from the clinic at the provider's discretion.      For prescription refill requests, have your pharmacy contact our office and allow 72 hours for refills to be completed.    Today you received the following chemotherapy and/or immunotherapy agents Keytruda      To help prevent nausea and vomiting after your treatment, we encourage you to take your nausea medication as directed.  BELOW ARE SYMPTOMS THAT SHOULD BE REPORTED IMMEDIATELY: *FEVER GREATER THAN 100.4 F (38 C) OR HIGHER *CHILLS OR SWEATING *NAUSEA AND VOMITING THAT IS NOT CONTROLLED WITH YOUR NAUSEA MEDICATION *UNUSUAL SHORTNESS OF BREATH *UNUSUAL BRUISING OR BLEEDING *URINARY PROBLEMS (pain or burning when urinating, or frequent urination) *BOWEL PROBLEMS (unusual diarrhea, constipation, pain near the anus) TENDERNESS IN MOUTH AND THROAT WITH OR WITHOUT PRESENCE OF ULCERS (sore throat, sores in mouth, or a toothache) UNUSUAL RASH, SWELLING OR PAIN  UNUSUAL VAGINAL DISCHARGE OR ITCHING   Items with * indicate a potential emergency and should be followed up  as soon as possible or go to the Emergency Department if any problems should occur.  Please show the CHEMOTHERAPY ALERT CARD or IMMUNOTHERAPY ALERT CARD at check-in to the Emergency Department and triage nurse.  Should you have questions after your visit or need to cancel or reschedule your appointment, please contact [facility]  - A DEPT OF [facility] [phone]  and follow the prompts.  Office hours are 8:00 a.m. to 4:30 p.m. Monday - Friday. Please note that voicemails left after 4:00 p.m. may not be returned until the following business day.  We are closed weekends and major holidays. You have access to a nurse at all times for urgent questions. Please call the main number to the clinic [phone] and follow the prompts.  For any non-urgent questions, you may also contact your provider using MyChart. We now offer e-Visits for anyone 30 and older to request care online for non-urgent symptoms. For details visit [email or web address].   Also download the MyChart app! Go to the app store, search "MyChart", open the app, select [place], and log in with your MyChart username and password.

## 2024-04-09 NOTE — Progress Notes (Signed)
 Patient presents today for Keytruda  infusion per providers order.  Vital signs and labs within parameters for treatment.  Patient has no new complaint at this time.  Treatment given today per MD orders.  Stable during infusion without adverse affects.  Vital signs stable.  No complaints at this time.  Discharge from clinic ambulatory in stable condition.  Alert and oriented X 3.  Follow up with [facility] as scheduled.

## 2024-04-10 LAB — CEA: CEA: 3.3 ng/mL (ref 0.0–4.7)

## 2024-04-12 ENCOUNTER — Inpatient Hospital Stay

## 2024-04-12 ENCOUNTER — Inpatient Hospital Stay: Admitting: Oncology

## 2024-05-03 ENCOUNTER — Inpatient Hospital Stay: Admitting: Oncology

## 2024-05-03 ENCOUNTER — Inpatient Hospital Stay

## 2024-05-18 ENCOUNTER — Ambulatory Visit: Admitting: Urology

## 2024-05-24 ENCOUNTER — Inpatient Hospital Stay

## 2024-08-04 ENCOUNTER — Ambulatory Visit
